# Patient Record
Sex: Male | Born: 1940 | Race: White | Hispanic: No | Marital: Married | Smoking: Never smoker
Health system: Southern US, Community
[De-identification: ages and names within clinical notes are randomized; demographics above are authoritative.]

## PROBLEM LIST (undated history)

## (undated) DIAGNOSIS — Z9660 Presence of unspecified orthopedic joint implant: Secondary | ICD-10-CM

## (undated) DIAGNOSIS — I447 Left bundle-branch block, unspecified: Secondary | ICD-10-CM

## (undated) DIAGNOSIS — E785 Hyperlipidemia, unspecified: Secondary | ICD-10-CM

## (undated) DIAGNOSIS — I10 Essential (primary) hypertension: Secondary | ICD-10-CM

## (undated) DIAGNOSIS — I6522 Occlusion and stenosis of left carotid artery: Secondary | ICD-10-CM

## (undated) DIAGNOSIS — C8510 Unspecified B-cell lymphoma, unspecified site: Secondary | ICD-10-CM

## (undated) DIAGNOSIS — Z9889 Other specified postprocedural states: Secondary | ICD-10-CM

## (undated) DIAGNOSIS — Z974 Presence of external hearing-aid: Secondary | ICD-10-CM

## (undated) DIAGNOSIS — N529 Male erectile dysfunction, unspecified: Secondary | ICD-10-CM

## (undated) DIAGNOSIS — Z859 Personal history of malignant neoplasm, unspecified: Secondary | ICD-10-CM

## (undated) DIAGNOSIS — Z85828 Personal history of other malignant neoplasm of skin: Secondary | ICD-10-CM

## (undated) DIAGNOSIS — C4491 Basal cell carcinoma of skin, unspecified: Secondary | ICD-10-CM

## (undated) DIAGNOSIS — N5089 Other specified disorders of the male genital organs: Secondary | ICD-10-CM

## (undated) DIAGNOSIS — J986 Disorders of diaphragm: Secondary | ICD-10-CM

## (undated) DIAGNOSIS — H353130 Nonexudative age-related macular degeneration, bilateral, stage unspecified: Secondary | ICD-10-CM

## (undated) DIAGNOSIS — Z85820 Personal history of malignant melanoma of skin: Secondary | ICD-10-CM

## (undated) DIAGNOSIS — H3530 Unspecified macular degeneration: Secondary | ICD-10-CM

## (undated) DIAGNOSIS — H43819 Vitreous degeneration, unspecified eye: Secondary | ICD-10-CM

## (undated) DIAGNOSIS — R3915 Urgency of urination: Secondary | ICD-10-CM

## (undated) DIAGNOSIS — D472 Monoclonal gammopathy: Secondary | ICD-10-CM

## (undated) DIAGNOSIS — C4492 Squamous cell carcinoma of skin, unspecified: Secondary | ICD-10-CM

## (undated) DIAGNOSIS — Z87898 Personal history of other specified conditions: Secondary | ICD-10-CM

## (undated) DIAGNOSIS — Z87442 Personal history of urinary calculi: Secondary | ICD-10-CM

## (undated) DIAGNOSIS — N200 Calculus of kidney: Secondary | ICD-10-CM

## (undated) DIAGNOSIS — C439 Malignant melanoma of skin, unspecified: Secondary | ICD-10-CM

## (undated) DIAGNOSIS — C8599 Non-Hodgkin lymphoma, unspecified, extranodal and solid organ sites: Secondary | ICD-10-CM

## (undated) HISTORY — PX: TONSILLECTOMY: SUR1361

## (undated) HISTORY — DX: Unspecified B-cell lymphoma, unspecified site: C85.10

## (undated) HISTORY — DX: Essential (primary) hypertension: I10

## (undated) HISTORY — DX: Disorders of diaphragm: J98.6

## (undated) HISTORY — DX: Squamous cell carcinoma of skin, unspecified: C44.92

## (undated) HISTORY — DX: Occlusion and stenosis of left carotid artery: I65.22

## (undated) HISTORY — DX: Calculus of kidney: N20.0

## (undated) HISTORY — DX: Unspecified macular degeneration: H35.30

## (undated) HISTORY — DX: Basal cell carcinoma of skin, unspecified: C44.91

## (undated) HISTORY — DX: Presence of unspecified orthopedic joint implant: Z96.60

## (undated) HISTORY — DX: Hyperlipidemia, unspecified: E78.5

## (undated) HISTORY — DX: Vitreous degeneration, unspecified eye: H43.819

## (undated) HISTORY — DX: Non-Hodgkin lymphoma, unspecified, extranodal and solid organ sites: C85.99

## (undated) HISTORY — DX: Male erectile dysfunction, unspecified: N52.9

## (undated) HISTORY — DX: Malignant melanoma of skin, unspecified: C43.9

## (undated) HISTORY — PX: EYE SURGERY: SHX253

---

## 1998-06-21 ENCOUNTER — Ambulatory Visit (HOSPITAL_COMMUNITY): Admission: RE | Admit: 1998-06-21 | Discharge: 1998-06-21 | Payer: Self-pay | Admitting: *Deleted

## 1998-10-17 DIAGNOSIS — C8599 Non-Hodgkin lymphoma, unspecified, extranodal and solid organ sites: Secondary | ICD-10-CM

## 1998-10-17 HISTORY — DX: Non-Hodgkin lymphoma, unspecified, extranodal and solid organ sites: C85.99

## 1998-10-17 HISTORY — PX: ORCHIECTOMY: SHX2116

## 1998-11-04 ENCOUNTER — Ambulatory Visit (HOSPITAL_COMMUNITY): Admission: RE | Admit: 1998-11-04 | Discharge: 1998-11-04 | Payer: Self-pay | Admitting: Family Medicine

## 1999-07-15 ENCOUNTER — Ambulatory Visit (HOSPITAL_COMMUNITY): Admission: RE | Admit: 1999-07-15 | Discharge: 1999-07-15 | Payer: Self-pay | Admitting: General Surgery

## 1999-07-15 ENCOUNTER — Encounter: Payer: Self-pay | Admitting: General Surgery

## 1999-07-21 ENCOUNTER — Encounter: Payer: Self-pay | Admitting: General Surgery

## 1999-07-22 ENCOUNTER — Ambulatory Visit (HOSPITAL_COMMUNITY): Admission: RE | Admit: 1999-07-22 | Discharge: 1999-07-22 | Payer: Self-pay | Admitting: General Surgery

## 1999-08-05 ENCOUNTER — Encounter: Admission: RE | Admit: 1999-08-05 | Discharge: 1999-08-05 | Payer: Self-pay | Admitting: Oncology

## 1999-08-05 ENCOUNTER — Encounter: Payer: Self-pay | Admitting: Oncology

## 1999-08-19 ENCOUNTER — Encounter: Admission: RE | Admit: 1999-08-19 | Discharge: 1999-11-17 | Payer: Self-pay | Admitting: Oncology

## 1999-09-21 ENCOUNTER — Ambulatory Visit (HOSPITAL_COMMUNITY): Admission: RE | Admit: 1999-09-21 | Discharge: 1999-09-21 | Payer: Self-pay | Admitting: Oncology

## 1999-09-21 ENCOUNTER — Encounter: Payer: Self-pay | Admitting: Oncology

## 1999-10-18 HISTORY — PX: MELANOMA EXCISION: SHX5266

## 2000-03-18 ENCOUNTER — Emergency Department (HOSPITAL_COMMUNITY): Admission: EM | Admit: 2000-03-18 | Discharge: 2000-03-18 | Payer: Self-pay | Admitting: Internal Medicine

## 2000-09-29 ENCOUNTER — Ambulatory Visit (HOSPITAL_COMMUNITY): Admission: RE | Admit: 2000-09-29 | Discharge: 2000-09-29 | Payer: Self-pay | Admitting: Oncology

## 2000-09-29 ENCOUNTER — Encounter: Payer: Self-pay | Admitting: Oncology

## 2001-01-10 ENCOUNTER — Encounter: Admission: RE | Admit: 2001-01-10 | Discharge: 2001-01-10 | Payer: Self-pay | Admitting: Oncology

## 2001-01-10 ENCOUNTER — Encounter: Payer: Self-pay | Admitting: Oncology

## 2001-07-12 ENCOUNTER — Ambulatory Visit (HOSPITAL_COMMUNITY): Admission: RE | Admit: 2001-07-12 | Discharge: 2001-07-12 | Payer: Self-pay | Admitting: Oncology

## 2001-07-12 ENCOUNTER — Encounter: Payer: Self-pay | Admitting: Oncology

## 2001-07-31 ENCOUNTER — Ambulatory Visit (HOSPITAL_COMMUNITY): Admission: RE | Admit: 2001-07-31 | Discharge: 2001-07-31 | Payer: Self-pay | Admitting: Oncology

## 2001-07-31 ENCOUNTER — Encounter: Payer: Self-pay | Admitting: Oncology

## 2001-09-10 ENCOUNTER — Ambulatory Visit (HOSPITAL_COMMUNITY): Admission: RE | Admit: 2001-09-10 | Discharge: 2001-09-10 | Payer: Self-pay | Admitting: Gastroenterology

## 2002-08-19 ENCOUNTER — Ambulatory Visit (HOSPITAL_COMMUNITY): Admission: RE | Admit: 2002-08-19 | Discharge: 2002-08-19 | Payer: Self-pay | Admitting: Oncology

## 2002-08-19 ENCOUNTER — Encounter: Payer: Self-pay | Admitting: Oncology

## 2002-09-06 ENCOUNTER — Encounter: Payer: Self-pay | Admitting: Oncology

## 2002-09-06 ENCOUNTER — Ambulatory Visit (HOSPITAL_COMMUNITY): Admission: RE | Admit: 2002-09-06 | Discharge: 2002-09-06 | Payer: Self-pay | Admitting: Oncology

## 2004-11-15 ENCOUNTER — Ambulatory Visit (HOSPITAL_COMMUNITY): Admission: RE | Admit: 2004-11-15 | Discharge: 2004-11-15 | Payer: Self-pay | Admitting: Oncology

## 2004-11-24 ENCOUNTER — Ambulatory Visit: Payer: Self-pay | Admitting: Oncology

## 2009-06-17 HISTORY — PX: TOTAL KNEE ARTHROPLASTY: SHX125

## 2015-12-08 LAB — CBC AND DIFFERENTIAL
HCT: 43 (ref 41–53)
Hemoglobin: 14.4 (ref 13.5–17.5)
Platelets: 210 (ref 150–399)
WBC: 5.6

## 2015-12-11 LAB — BASIC METABOLIC PANEL
BUN: 18 (ref 4–21)
Creatinine: 0.7 (ref 0.6–1.3)
Glucose: 96
Potassium: 4.3 (ref 3.4–5.3)
Potassium: 4.3 (ref 3.4–5.3)
Sodium: 139 (ref 137–147)
Sodium: 139 (ref 137–147)

## 2015-12-11 LAB — LIPID PANEL
Cholesterol: 135 (ref 0–200)
HDL: 61 (ref 35–70)
LDL Cholesterol: 63
Triglycerides: 56 (ref 40–160)

## 2015-12-11 LAB — VITAMIN D 25 HYDROXY (VIT D DEFICIENCY, FRACTURES)
Vit D, 25-Hydroxy: 42.1
Vit D, 25-Hydroxy: 42.1

## 2015-12-11 LAB — HEPATIC FUNCTION PANEL
ALT: 39 (ref 10–40)
AST: 21 (ref 14–40)
Alkaline Phosphatase: 59 (ref 25–125)
Bilirubin, Total: 0.6

## 2015-12-11 LAB — HEMOGLOBIN A1C: Hemoglobin A1C: 5.7

## 2015-12-11 LAB — TSH: TSH: 2.18 (ref 0.41–5.90)

## 2016-10-17 HISTORY — PX: CATARACT EXTRACTION W/ INTRAOCULAR LENS IMPLANT: SHX1309

## 2017-01-15 HISTORY — PX: CATARACT EXTRACTION: SUR2

## 2017-05-18 DIAGNOSIS — L119 Acantholytic disorder, unspecified: Secondary | ICD-10-CM | POA: Diagnosis not present

## 2017-05-18 DIAGNOSIS — D045 Carcinoma in situ of skin of trunk: Secondary | ICD-10-CM | POA: Diagnosis not present

## 2017-05-18 DIAGNOSIS — D1801 Hemangioma of skin and subcutaneous tissue: Secondary | ICD-10-CM | POA: Diagnosis not present

## 2017-05-18 DIAGNOSIS — L821 Other seborrheic keratosis: Secondary | ICD-10-CM | POA: Diagnosis not present

## 2017-05-18 DIAGNOSIS — L820 Inflamed seborrheic keratosis: Secondary | ICD-10-CM | POA: Diagnosis not present

## 2017-05-18 DIAGNOSIS — Z85820 Personal history of malignant melanoma of skin: Secondary | ICD-10-CM | POA: Diagnosis not present

## 2017-05-18 DIAGNOSIS — C44509 Unspecified malignant neoplasm of skin of other part of trunk: Secondary | ICD-10-CM | POA: Diagnosis not present

## 2017-05-18 DIAGNOSIS — L570 Actinic keratosis: Secondary | ICD-10-CM | POA: Diagnosis not present

## 2017-05-18 DIAGNOSIS — D485 Neoplasm of uncertain behavior of skin: Secondary | ICD-10-CM | POA: Diagnosis not present

## 2017-05-18 DIAGNOSIS — C44699 Other specified malignant neoplasm of skin of left upper limb, including shoulder: Secondary | ICD-10-CM | POA: Diagnosis not present

## 2017-06-21 ENCOUNTER — Non-Acute Institutional Stay: Payer: PPO | Admitting: Internal Medicine

## 2017-06-21 ENCOUNTER — Encounter: Payer: Self-pay | Admitting: Internal Medicine

## 2017-06-21 VITALS — BP 136/80 | HR 82 | Temp 97.8°F | Ht 72.0 in | Wt 195.0 lb

## 2017-06-21 DIAGNOSIS — Z87442 Personal history of urinary calculi: Secondary | ICD-10-CM

## 2017-06-21 DIAGNOSIS — H9113 Presbycusis, bilateral: Secondary | ICD-10-CM | POA: Insufficient documentation

## 2017-06-21 DIAGNOSIS — K5901 Slow transit constipation: Secondary | ICD-10-CM

## 2017-06-21 DIAGNOSIS — I10 Essential (primary) hypertension: Secondary | ICD-10-CM | POA: Diagnosis not present

## 2017-06-21 DIAGNOSIS — Z85820 Personal history of malignant melanoma of skin: Secondary | ICD-10-CM | POA: Insufficient documentation

## 2017-06-21 DIAGNOSIS — E7800 Pure hypercholesterolemia, unspecified: Secondary | ICD-10-CM | POA: Insufficient documentation

## 2017-06-21 DIAGNOSIS — H6121 Impacted cerumen, right ear: Secondary | ICD-10-CM | POA: Diagnosis not present

## 2017-06-21 DIAGNOSIS — G4720 Circadian rhythm sleep disorder, unspecified type: Secondary | ICD-10-CM | POA: Diagnosis not present

## 2017-06-21 DIAGNOSIS — Z8572 Personal history of non-Hodgkin lymphomas: Secondary | ICD-10-CM | POA: Insufficient documentation

## 2017-06-21 DIAGNOSIS — M5030 Other cervical disc degeneration, unspecified cervical region: Secondary | ICD-10-CM | POA: Diagnosis not present

## 2017-06-21 DIAGNOSIS — H3530 Unspecified macular degeneration: Secondary | ICD-10-CM | POA: Diagnosis not present

## 2017-06-21 DIAGNOSIS — I6522 Occlusion and stenosis of left carotid artery: Secondary | ICD-10-CM | POA: Diagnosis not present

## 2017-06-21 DIAGNOSIS — D126 Benign neoplasm of colon, unspecified: Secondary | ICD-10-CM | POA: Diagnosis not present

## 2017-06-21 DIAGNOSIS — Z23 Encounter for immunization: Secondary | ICD-10-CM

## 2017-06-21 DIAGNOSIS — E559 Vitamin D deficiency, unspecified: Secondary | ICD-10-CM | POA: Insufficient documentation

## 2017-06-21 MED ORDER — ZOSTER VAC RECOMB ADJUVANTED 50 MCG/0.5ML IM SUSR: 0.5000 mL | Freq: Once | INTRAMUSCULAR | 1 refills | Status: AC

## 2017-06-21 NOTE — Patient Instructions (Addendum)
Please come in for fasting labs in 1-2 weeks in [place] [facility] at [place].  [facility] gave you an appointment card).  We'll set you up for your wellness visit May of 2019.    Please call me if you have any problems between now and then.    Try to increase your water intake, fruits and vegetables and exercise routine for balance and cardio/aerobic.    Be sure to take your meloxicam with food.    I sent the shingrix shot orders to [facility] for you.

## 2017-06-21 NOTE — Progress Notes (Signed)
Provider:  Rexene Edison. Mariea Clonts, D.O., C.M.D. Location:  Occupational psychologist of Service:  Clinic (12)  Previous PCP: [name], DO Patient Care Team: [name], DO as PCP - General (Geriatric Medicine)  Extended Emergency Contact Information Primary Emergency Contact: Brindley,[name] Address: [place] [address] [name] Phone: [phone] Relation: Spouse  Code Status: DNR Goals of Care: Advanced Directive information Advanced Directives 06/21/2017  Does Patient Have a Medical Advance Directive? Yes  Type of Paramedic of Page;Living will;Out of facility DNR (pink MOST or yellow form)  Copy of Johnson in Chart? No - copy requested  Pre-existing out of facility DNR order (yellow form or pink MOST form) Yellow form placed in chart (order not valid for inpatient use)    Chief Complaint  Patient presents with  . Establish Care    new patient to [place] [facility], referred by Dr. Debara Pickett (cardiology)    HPI: Patient is a 76 y.o. male seen today to establish with [facility].  Records have been requested from Dr. Mathis Dad fax [phone] on 05/09/17.  None received as of 06/21/17. New patient packet reviewed.  Pt actually brought copies of his annual wellness exams from the past three years and his labs.  He is from the [place], [place] area.  Had a place in [place], [place] in summers.  They sold that two months ago.  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  Lived in [place] for 10 years in between time here in [place].    Melanoma:  Had melanoma of left calf in 2000. Had chemotherapy and XRT.  Dermatology--now follows with Dr Wilhemina Bonito.  He did a lot of biopsies. Returns for an operation next Monday.  Getting his twice a year routine skin checks.    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  Had radiation,  surgery, and shots in the spine x 6 and interferon shots for a year (he does not recall if it was for melanoma or lymphoma), also had chemo.   Stopped going to a lymphoma specialist.  LDH was being done.    Both of the above were found and managed very quickly.  He's had no recurrences.  HTN:  BP at goal with cozaar 100mg  daily.    Hyperlipidemia: 12/08/15 LDL was 63.  On zocor 40mg  daily.    Has sinus drainage, but no allergies to medications.  Left carotid stenosis:  Found on one of the annual screening tests.    Macular degeneration:  He's on watch for this.  He was waiting to hear back from Dr. Tempie Hoist.  Mentioned new physician joined there also who may be able to see him and is a retina specialist.    Kidney stone:  In [place], he didn't feel well in the evening.  Said he really didn't feel well and his wife went down to the desk.  He saw a doctor and was given morphine and passed the stone unknowingly and felt much better the next day.    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  Tubular adenomas on cscope 09/18/12.  F/u needed this December per previous PCP notes--he is not keen on this and  is over 26 yo.      Has not had shingrix b/c of moving and wanted to get both at the same place.  Had zostavax, prevnar, TD, and pneumovax.   Uses valacyclovir prn fever blisters.    Meloxicam is for his stiff neck.  Had therapy, accupuncture, shots in the neck.  It's been like magic.  He's afraid to come off of it.    Has amoxicillin prn dental work due to prior knee replacement.  Discussed that that is no longer indicated.  He does report having a friend who had a septic joint replacement so he's worried about this.  Discussed yogurt if he opts to take the amoxicillin.    Says all of a sudden, he's getting up at 3am.  He's not attributing it to what he ate or drank.  Starts to think about what he's doing the next day.  He  does not take anything for it.  Was taking a benadryl type thing but read it's not so good so stopped it.  His wife has given him sleepytime tea.  He has lost his workout routine.  It will be a couple more weeks until that's straight due to travels for hunting.  [place] but [place], he's less active.    Constipation:  New onset.  Used to be so regular, it was unbelievable.  Admits diet is different more b/c of traveling.  Not getting so many vegetables and does not drink his water.  Trying to keep a glass at the desk at home.    Past Medical History:  Diagnosis Date  . Cataracts, bilateral   . Hyperlipidemia   . Hypertension   . Lymphoma of testis ([place])   . Melanoma [facility])    Past Surgical History:  Procedure Laterality Date  . CATARACT EXTRACTION W/ INTRAOCULAR LENS IMPLANT Bilateral 2018  . MELANOMA EXCISION  10/1999  . TOTAL KNEE ARTHROPLASTY  06/2009    Social History   Social History  . Marital status: Married    Spouse name: N/A  . Number of children: N/A  . Years of education: N/A   Social History Main Topics  . Smoking status: Never Smoker  . Smokeless tobacco: Never Used  . Alcohol use Yes     Comment: 5 to 7 drinks per week  . Drug use: No  . Sexual activity: Not Currently   Other Topics Concern  . None   Social History Narrative   Social History      Diet?good      Do you drink/eat things with caffeine? seldom      Marital status?          yes                          What year were you married? 1962      Do you live in a house, apartment, assisted living, condo, trailer, etc.? house      Is it one or more stories?1      How many persons live in your home? 2      Do you have any pets in your home? (please list) no      Highest level of education completed? college      Current or past profession: president and [employer]      Do you exercise?   yes  Type &  how often? 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 yes      Do you have a DNR form?       yes                            If not, do you want to discuss one?      Do you have signed POA/HPOA for forms?  yes      Functional Status      Do you have difficulty bathing or dressing yourself? no      Do you have difficulty preparing food or eating? no      Do you have difficulty managing your medications? no      Do you have difficulty managing your finances? no      Do you have difficulty affording your medications? no       reports that he has never smoked. He has never used smokeless tobacco. He reports that he drinks alcohol. He reports that he does not use drugs.  Functional Status Survey:  independent  History reviewed. No pertinent family history.  Health Maintenance  Topic Date Due  . TETANUS/TDAP  02/10/1958  . PNA vac Low Risk Adult (1 of 2 - PCV13) 02/11/2004  . INFLUENZA VACCINE  05/17/2017    No Known Allergies  Allergies as of 06/21/2017   No Known Allergies     Medication List       Accurate as of 06/21/17  3:59 PM. Always use your most recent med list.          losartan 100 MG tablet Commonly known as:  COZAAR Take 100 mg by mouth daily.   meloxicam 7.5 MG tablet Commonly known as:  MOBIC Take 7.5 mg by mouth as needed for pain.   PRESERVISION AREDS 2 PO Take 2 capsules by mouth daily.   simvastatin 40 MG tablet Commonly known as:  ZOCOR Take 40 mg by mouth daily.   Vitamin D3 2000 units Tabs Take 1 tablet by mouth daily.   Zoster Vac Recomb Adjuvanted injection Commonly known as:  SHINGRIX Inject 0.5 mLs into the muscle once.            Discharge Care Instructions        Start     Ordered   06/21/17 0000  Zoster Vac Recomb Adjuvanted [facility]) injection   Once     06/21/17 1432   06/21/17 0000  EAR CERUMEN REMOVAL     06/21/17 1505      Review of Systems  Constitutional: Negative for chills, fever and  malaise/fatigue.  HENT: Positive for hearing loss and tinnitus. Negative for congestion.        Hearing aids  Eyes: Negative for blurred vision.       Macular degeneration  Respiratory: Negative for cough and shortness of breath.   Cardiovascular: Negative for chest pain, palpitations and leg swelling.  Gastrointestinal: Positive for constipation. Negative for abdominal pain, blood in stool, diarrhea, heartburn, melena, nausea and vomiting.  Genitourinary: Negative for dysuria.  Musculoskeletal: Positive for joint pain and neck pain. Negative for falls.       Left knee injury   Skin: Negative for itching and rash.       H/o skin cancers--SCC, melanoma and basal cell ca  Neurological: Negative for loss of consciousness, weakness and headaches.       Some  balance problems  Endo/Heme/Allergies: Does not bruise/bleed easily.  Psychiatric/Behavioral: Negative for depression and memory loss. The patient does not have insomnia.     Vitals:   06/21/17 1318  BP: 136/80  Pulse: 82  Temp: 97.8 F (36.6 C)  TempSrc: Oral  SpO2: 97%  Weight: 195 lb (88.5 kg)  Height: 6' (1.829 m)   Body mass index is 26.45 kg/m. Physical Exam  Constitutional: He is oriented to person, place, and time. He appears well-developed and well-nourished. No distress.  HENT:  Head: Normocephalic and atraumatic.  Right Ear: External ear normal.  Left Ear: External ear normal.  Nose: Nose normal.  Mouth/Throat: Oropharynx is clear and moist. No oropharyngeal exudate.  Right ear cerumen impaction; left ear with very little cerumen, pink tm  Eyes: Pupils are equal, round, and reactive to light. Conjunctivae and EOM are normal. No scleral icterus.  Neck: Neck supple. No JVD present. No tracheal deviation present. No thyromegaly present.  Cardiovascular: Normal rate, regular rhythm, normal heart sounds and intact distal pulses.   No murmur heard. Pulmonary/Chest: Effort normal and breath sounds normal. No  respiratory distress.  Abdominal: Soft. Bowel sounds are normal. He exhibits no distension. There is no tenderness.  Musculoskeletal: Normal range of motion.  Limited movement of left knee with prior injury, no tenderness of ROM difficulty with neck since on mobic  Lymphadenopathy:    He has no cervical adenopathy.  Neurological: He is alert and oriented to person, place, and time. No cranial nerve deficit.  Skin: Skin is warm and dry. Capillary refill takes less than 2 seconds.  Psychiatric: He has a normal mood and affect. His behavior is normal. Judgment and thought content normal.    Labs reviewed: Basic Metabolic Panel: No results for input(s): NA, K, CL, CO2, GLUCOSE, BUN, CREATININE, CALCIUM, MG, PHOS in the last 8760 hours. Liver Function Tests: No results for input(s): AST, ALT, ALKPHOS, BILITOT, PROT, ALBUMIN in the last 8760 hours. No results for input(s): LIPASE, AMYLASE in the last 8760 hours. No results for input(s): AMMONIA in the last 8760 hours. CBC: No results for input(s): WBC, NEUTROABS, HGB, HCT, MCV, PLT in the last 8760 hours. Cardiac Enzymes: No results for input(s): CKTOTAL, CKMB, CKMBINDEX, TROPONINI in the last 8760 hours. BNP: Invalid input(s): POCBNP No results found for: HGBA1C No results found for: TSH No results found for: VITAMINB12 No results found for: FOLATE No results found for: IRON, TIBC, FERRITIN Labs reviewed and 2017 results to be abstracted  Imaging and Procedures noted on new patient packet: Cscope reviewed 2013 with tubular adenoma  Assessment/Plan 1. Sleep-wake disorder -now waking in the middle of the night -agrees to cont sleepy tea and trying to exercise more to get back on track  2. Slow transit constipation -new problem but diet not as good and not hydrating or exercising--encouraged these to help this issue  3. Macular degeneration of both eyes, unspecified type -cont preservision, awaits appt with Dr. Zigmund Daniel, et  al  4. Benign essential HTN -bp at goal with losartan, cont to monitor  5. Pure hypercholesterolemia -cont zocor therapy, last LDL last year was at goal  6. History of B-cell lymphoma -see hpi -check LDH annually and cbc  7. Personal history of malignant melanoma -no longer follows with oncology, but seeing Dr. Wilhemina Bonito form derm for twice a year skin checks  8. Left carotid artery stenosis -noted on a screening study, not a legitimate test by a physician  9. DDD (degenerative disc  disease), cervical -better with mobic, counseled to take with food to avoid GI upset  10. History of kidney stones -one time episode  11. Tubular adenoma of colon -in 2013, f/u cscope in 12/18 recommended, pt currently declines  12. Hearing loss of right ear due to cerumen impaction -s/p flushing with warm water and hydrogen peroxide  13. 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 Vitamin D deficiency -cont vitamin D supplement and f/u level  15. Need for shingles vaccine - Zoster Vac Recomb Adjuvanted [facility]) injection; Inject 0.5 mLs into the muscle once.  Dispense: 0.5 mL; Refill: 1  Labs/tests ordered:  Cbc with diff,bmp, liver panel, flp, LDH, vitamin D in 2 wks   Dava Rensch L. Elma Shands, D.O. [facility] [address] [place],  [address] Cell Phone (Mon-Fri 8am-5pm):  [phone] On Call:  [phone] & follow prompts after 5pm & weekends Office Phone:  [phone] Office Fax:  [phone]

## 2017-06-23 ENCOUNTER — Encounter: Payer: Self-pay | Admitting: Internal Medicine

## 2017-07-03 ENCOUNTER — Telehealth: Payer: Self-pay | Admitting: Internal Medicine

## 2017-07-03 DIAGNOSIS — C44609 Unspecified malignant neoplasm of skin of left upper limb, including shoulder: Secondary | ICD-10-CM | POA: Diagnosis not present

## 2017-07-03 DIAGNOSIS — C44629 Squamous cell carcinoma of skin of left upper limb, including shoulder: Secondary | ICD-10-CM | POA: Diagnosis not present

## 2017-07-03 NOTE — Telephone Encounter (Signed)
Returned call to [employer] with [facility].Advised patient is not a patient of Dr.Hilty.After reviewing chart he has never seen cardiology.

## 2017-07-03 NOTE — Telephone Encounter (Signed)
New Message  Antonio [name] was calling form the eye Doctors office he was referred to. They were calling to speak with RN to see what exactly pt was being referred for .please call back to discuss

## 2017-07-04 ENCOUNTER — Encounter: Payer: Self-pay | Admitting: Internal Medicine

## 2017-07-04 LAB — CBC AND DIFFERENTIAL
HCT: 47 (ref 41–53)
Hemoglobin: 15.2 (ref 13.5–17.5)
Platelets: 232 (ref 150–399)
WBC: 7.1

## 2017-07-04 LAB — BASIC METABOLIC PANEL
BUN: 19 (ref 4–21)
Creatinine: 0.7 (ref 0.6–1.3)
Glucose: 96
Potassium: 4.7 (ref 3.4–5.3)
Sodium: 141 (ref 137–147)

## 2017-07-04 LAB — LIPID PANEL
Cholesterol: 171 (ref 0–200)
HDL: 77 — AB (ref 35–70)
LDL Cholesterol: 80
Triglycerides: 71 (ref 40–160)

## 2017-07-04 LAB — HEPATIC FUNCTION PANEL
ALT: 37 (ref 10–40)
AST: 30 (ref 14–40)
Alkaline Phosphatase: 66 (ref 25–125)
Bilirubin, Total: 0.9

## 2017-07-04 LAB — VITAMIN D 25 HYDROXY (VIT D DEFICIENCY, FRACTURES): Vit D, 25-Hydroxy: 40.39

## 2017-07-07 ENCOUNTER — Encounter: Payer: Self-pay | Admitting: Internal Medicine

## 2017-07-10 DIAGNOSIS — H353132 Nonexudative age-related macular degeneration, bilateral, intermediate dry stage: Secondary | ICD-10-CM | POA: Diagnosis not present

## 2017-07-10 NOTE — Progress Notes (Signed)
[facility] Note  07/11/2017     CHIEF COMPLAINT Patient presents for Retina Evaluation   HISTORY OF PRESENT ILLNESS: Antonio [name] is a 76 y.o. male who presents to the clinic today for:   HPI    Retina Evaluation  In both eyes.  This started 1 year ago.  Associated Symptoms Floaters.  Negative for Flashes, Blind Spot, Photophobia, Scalp Tenderness, Fever, Pain, Glare, Weight Loss, Jaw Claudication, Distortion, Redness, Trauma, Shoulder/Hip pain and Fatigue.  Context:  distance vision, mid-range vision and near vision.  Treatments tried include no treatments.        Comments  Pt presents for retinal evaluation; Pt reports that he recently moved to the area and wants to establish care; Pt states that he has floaters off and on; Pt reports only having flashes when he rubs OU; Pt states that he has more floaters since having cataract sx in April of this year (2018); Pt denies wavy VA, denies ocular pain; Pt denies gtts usage; Pt reports taking AREDS 2; Pt states that he has a "foresee" device that tracks his VA and alerts MD if there is a drop in [place];      Last edited by Alyse Low on 07/11/2017  8:49 AM. (History)      Referring physician: [name], DO [address], [place] [address]  HISTORICAL INFORMATION:   Selected notes from the [place] for ARMD OU; Moved from [place], [place]  Ocular Hx - nonexudative ARMD OU; PVD OU; pseudophakia OU (Toric OU - 12/2016 by Dr. Nance Pear, [place], [facility]);  PMH - HTN; [facility] - ARMD - Mother   CURRENT MEDICATIONS: No current outpatient prescriptions on file. (Ophthalmic Drugs)   No current facility-administered medications for this visit.  (Ophthalmic Drugs)   Current Outpatient Prescriptions (Other)  Medication Sig  . Cholecalciferol (VITAMIN D3) 2000 units TABS Take 1 tablet by mouth daily.  Marland Kitchen losartan (COZAAR) 100 MG tablet Take 100 mg by mouth daily.  .  meloxicam (MOBIC) 7.5 MG tablet Take 7.5 mg by mouth as needed for pain.  . Multiple Vitamins-Minerals (PRESERVISION AREDS 2 PO) Take 2 capsules by mouth daily.  . simvastatin (ZOCOR) 40 MG tablet Take 40 mg by mouth daily.   No current facility-administered medications for this visit.  (Other)      REVIEW OF SYSTEMS: ROS    Positive for: Constitutional   Negative for: Gastrointestinal, Neurological, Skin, Genitourinary, Musculoskeletal, HENT, Endocrine, Cardiovascular, Eyes, Respiratory, Psychiatric, Allergic/Imm, Heme/Lymph   Last edited by Alyse Low on 07/11/2017  8:49 AM. (History)       ALLERGIES No Known Allergies  PAST MEDICAL HISTORY Past Medical History:  Diagnosis Date  . B-cell lymphoma ([place])    testicular diffuse large b-cell lymphoma  . BCC (basal cell carcinoma of skin)   . Cataracts, bilateral   . Elevated diaphragm    right  . Erectile dysfunction   . Hyperlipidemia   . Hypertension   . Kidney stone   . Lymphoma of testis ([place])   . Melanoma ([place])   . Melanoma ([place])   . Posterior vitreous detachment   . SCC (squamous cell carcinoma)   . Status post joint replacement    right TKA   Past Surgical History:  Procedure Laterality Date  . CATARACT EXTRACTION Bilateral 01/2017   Dr. Merril Abbe [facility], [place])   . CATARACT EXTRACTION W/ INTRAOCULAR LENS IMPLANT Bilateral 2018  . MELANOMA EXCISION  10/1999  . TOTAL KNEE ARTHROPLASTY  06/2009    FAMILY HISTORY Family History  Problem Relation Age of Onset  . Cataracts Mother   . Cataracts Father   . Amblyopia Neg Hx   . Blindness Neg Hx   . Glaucoma Neg Hx   . Macular degeneration Neg Hx   . Retinal detachment Neg Hx   . Strabismus Neg Hx   . Retinitis pigmentosa Neg Hx     SOCIAL HISTORY Social History  Substance Use Topics  . Smoking status: Never Smoker  . Smokeless tobacco: Never Used  . Alcohol use Yes     Comment: 5 to 7 drinks per week         OPHTHALMIC EXAM:  Base  Eye Exam    Visual Acuity (Snellen - Linear)      Right Left   Dist cc 20/20 20/20 -1   Dist ph cc NI 20/20   Correction:  Glasses       Tonometry (Applanation, 9:01 AM)      Right Left   Pressure 19 18       Pupils      Dark Light Shape React APD   Right 3 2 Round Brisk None   Left 3 2 Round Brisk None       Visual Fields (Counting fingers)      Left Right    Full Full       Extraocular Movement      Right Left    Full, Ortho Full, Ortho       Neuro/Psych    Oriented x3:  Yes   Mood/Affect:  Normal       Dilation    Both eyes:  1.0% Mydriacyl, 2.5% Phenylephrine @ 9:01 AM   Phen 10% instilled in OU at 9:25AM        Slit Lamp and Fundus Exam    External Exam      Right Left   External Brow ptosis - mild Brow ptosis - mild       Slit Lamp Exam      Right Left   Lids/Lashes Dermatochalasis - upper lid Dermatochalasis - upper lid   Conjunctiva/Sclera White and quiet White and quiet   Cornea Clear; trace endo pigment; well healed femoto laser wound Clear; trace endo pigment; well healed femoto laser wound   Anterior Chamber Deep and quiet Deep and quiet   Iris Round with moderate dilated to 58mm Round with moderate dilated to 69mm   Lens Posterior chamber intraocular lens in good postion, 1-2+ Posterior capsular opacification Posterior chamber intraocular lens in good postion; 1+ Posterior capsular opacification   Vitreous Vitreous syneresis, Posterior vitreous detachment Vitreous syneresis, Posterior vitreous detachment       Fundus Exam      Right Left   Disc compact with Peripapillary atrophy 360 compact with Peripapillary atrophy 360   C/D Ratio 0.5 0.5   Macula Drusen, Retinal pigment epithelial mottling and clumping  Flat; Drusen, Retinal pigment epithelial mottling and clumping    Vessels AV crossing changes - mild AV crossing changes - mild   Periphery Attached; Reticular degeneration;  Attached; Reticular degeneration;         Refraction     Wearing Rx      Sphere Cylinder Axis Add   Right -0.75 +0.50 106 +2.50   Left -0.50 +0.75 121 +2.50   Age:  73m   Type:  PAL  IMAGING AND PROCEDURES  Imaging and Procedures for 07/11/17  OCT, Retina - OU - Both Eyes     Right Eye Quality was good. Central Foveal Thickness: 322. Findings include normal foveal contour, no IRF, no SRF (Focal areas of outter retinal atrophy / ellipsoid dropout; scattered drusen).   Left Eye Quality was good. Central Foveal Thickness: 330. Findings include normal foveal contour, no IRF, no SRF (Scattered drusen; ).   Notes Images stored on drive  Diagnosis / Impression:  Mild nonexudative ARMD OU  Clinical management:  See below  Abbreviations: NFP - Normal foveal profile. CME - cystoid macular edema. PED - pigment epithelial detachment. IRF - intraretinal fluid. SRF - subretinal fluid. EZ - ellipsoid zone. ERM - epiretinal membrane. ORA - outer retinal atrophy. ORT - outer retinal tubulation. SRHM - subretinal hyper-reflective material                ASSESSMENT/PLAN:    ICD-10-CM   1. Early dry stage nonexudative age-related macular degeneration of both eyes H35.3131 OCT, Retina - OU - Both Eyes  2. Pseudophakia of both eyes Z96.1   3. 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 Bilateral posterior capsular opacification H26.493     1. Age related macular degeneration, non-exudative, both eyes  - The incidence, anatomy, and pathology of dry AMD, risk of progression, and the AREDS and AREDS 2 study including smoking risks discussed with patient.  - Recommend amsler grid monitoring  - has a Foresee AMD monitoring device at home  - f/u 6 mos  2. Pseudophakia OU  - s/p CE/PCIOL OU (Toric + femto) 12/2016 by Dr. Nance Pear in [facility]  - had post op IOP issue OS  - beautiful surgery, doing well  - monitor  3. H/o ocular hypertension OU  - IOP good today off combigan  - monitor  4. PCO OU  - Discussed YAG Cap treatment  for PCO;  - not yet visually significant  - monitor for now  Ophthalmic Meds Ordered this visit:  No orders of the defined types were placed in this encounter.      Return in about 6 months (around 01/08/2018) for Dilated Exam, OCT.  There are no Patient Instructions on file for this visit.   Explained the diagnoses, plan, and follow up with the patient and they expressed understanding.  Patient expressed understanding of the importance of proper follow up care.   Gardiner Sleeper, M.D., Ph.D. Vitreoretinal Surgeon [facility] 07/11/17     Abbreviations: M myopia (nearsighted); A astigmatism; H hyperopia (farsighted); P presbyopia; Mrx spectacle prescription;  CTL contact lenses; OD right eye; OS left eye; OU both eyes  XT exotropia; ET esotropia; PEK punctate epithelial keratitis; PEE punctate epithelial erosions; DES dry eye syndrome; MGD meibomian gland dysfunction; ATs artificial tears; PFAT's preservative free artificial tears; [place] nuclear sclerotic cataract; PSC posterior subcapsular cataract; ERM epi-retinal membrane; PVD posterior vitreous detachment; RD retinal detachment; DM diabetes mellitus; DR diabetic retinopathy; NPDR non-proliferative diabetic retinopathy; PDR proliferative diabetic retinopathy; CSME clinically significant macular edema; DME diabetic macular edema; dbh dot blot hemorrhages; CWS cotton wool spot; POAG primary open angle glaucoma; C/D cup-to-disc ratio; HVF humphrey visual field; GVF goldmann visual field; OCT optical coherence tomography; IOP intraocular pressure; BRVO Branch retinal vein occlusion; CRVO central retinal vein occlusion; CRAO central retinal artery occlusion; BRAO branch retinal artery occlusion; RT retinal tear; SB scleral buckle; PPV pars plana vitrectomy; VH Vitreous hemorrhage; PRP panretinal laser photocoagulation; IVK  intravitreal kenalog; VMT vitreomacular traction; MH Macular hole;  NVD neovascularization of the  disc; NVE neovascularization elsewhere; AREDS age related eye disease study; ARMD age related macular degeneration; POAG primary open angle glaucoma; EBMD epithelial/anterior basement membrane dystrophy; ACIOL anterior chamber intraocular lens; IOL intraocular lens; PCIOL posterior chamber intraocular lens; Phaco/IOL phacoemulsification with intraocular lens placement; [place] photorefractive keratectomy; LASIK laser assisted in situ keratomileusis; HTN hypertension; DM diabetes mellitus; COPD chronic obstructive pulmonary disease

## 2017-07-11 ENCOUNTER — Ambulatory Visit (INDEPENDENT_AMBULATORY_CARE_PROVIDER_SITE_OTHER): Payer: PPO | Admitting: Ophthalmology

## 2017-07-11 ENCOUNTER — Encounter (INDEPENDENT_AMBULATORY_CARE_PROVIDER_SITE_OTHER): Payer: Self-pay | Admitting: Ophthalmology

## 2017-07-11 DIAGNOSIS — H353131 Nonexudative age-related macular degeneration, bilateral, early dry stage: Secondary | ICD-10-CM

## 2017-07-11 DIAGNOSIS — H40053 Ocular hypertension, bilateral: Secondary | ICD-10-CM

## 2017-07-11 DIAGNOSIS — Z961 Presence of intraocular lens: Secondary | ICD-10-CM

## 2017-07-11 DIAGNOSIS — H26493 Other secondary cataract, bilateral: Secondary | ICD-10-CM

## 2017-08-09 ENCOUNTER — Other Ambulatory Visit: Payer: Self-pay | Admitting: *Deleted

## 2017-08-09 DIAGNOSIS — H353122 Nonexudative age-related macular degeneration, left eye, intermediate dry stage: Secondary | ICD-10-CM | POA: Diagnosis not present

## 2017-08-09 MED ORDER — SIMVASTATIN 40 MG PO TABS: 40.0000 mg | ORAL_TABLET | Freq: Every day | ORAL | 1 refills | Status: DC

## 2017-08-09 MED ORDER — LOSARTAN POTASSIUM 100 MG PO TABS: 100.0000 mg | ORAL_TABLET | Freq: Every day | ORAL | 1 refills | Status: DC

## 2017-08-09 NOTE — Telephone Encounter (Signed)
Patient requested 

## 2017-08-23 ENCOUNTER — Other Ambulatory Visit: Payer: Self-pay | Admitting: *Deleted

## 2017-08-23 ENCOUNTER — Telehealth: Payer: Self-pay

## 2017-08-23 MED ORDER — MELOXICAM 7.5 MG PO TABS: 7.5000 mg | ORAL_TABLET | ORAL | 3 refills | Status: DC | PRN

## 2017-08-23 MED ORDER — MELOXICAM 7.5 MG PO TABS: 7.5000 mg | ORAL_TABLET | Freq: Every day | ORAL | 3 refills | Status: DC | PRN

## 2017-08-23 NOTE — Telephone Encounter (Signed)
Patient requested Refill to [facility]

## 2017-08-23 NOTE — Telephone Encounter (Signed)
rx corrected and sent to [facility]

## 2017-08-23 NOTE — Telephone Encounter (Signed)
Antonio [name] with [facility] called requesting specific instructions for Mobic, states medication can not say as needed, that could indicate patient could take all 30 pills in one day.  Antonio [name] is requesting a new rx be submitted with specific instructions

## 2017-08-31 LAB — LIPID PANEL
Cholesterol: 130 (ref 0–200)
HDL: 68 (ref 35–70)
LDL Cholesterol: 49
Triglycerides: 97 (ref 40–160)

## 2017-08-31 LAB — BASIC METABOLIC PANEL: Glucose: 87

## 2017-09-08 DIAGNOSIS — H353122 Nonexudative age-related macular degeneration, left eye, intermediate dry stage: Secondary | ICD-10-CM | POA: Diagnosis not present

## 2017-09-14 ENCOUNTER — Encounter: Payer: Self-pay | Admitting: Internal Medicine

## 2017-10-08 DIAGNOSIS — H353122 Nonexudative age-related macular degeneration, left eye, intermediate dry stage: Secondary | ICD-10-CM | POA: Diagnosis not present

## 2017-10-20 ENCOUNTER — Telehealth: Payer: Self-pay

## 2017-10-20 MED ORDER — AZITHROMYCIN 250 MG PO TABS: ORAL_TABLET | ORAL | 0 refills | Status: DC

## 2017-10-20 NOTE — Telephone Encounter (Signed)
Patient called complaining of cold/upper respiratory/sinus symptoms  1. Fever? No 2. Chills? No 3. Myalgias? No 4. How long have you had your symptoms? 7-10 days 5. Are you coughing up mucus and if yes any discoloration? Yes, yellow drainage 6. What have you done for your symptoms thus far? Nyquil and mucinex 7. Have you been around anyone sick? Most family and friends sick, one family member went to the doctor several times for same symptoms   I will forward your response to your provider and call you with further instructions   Patient is traveling x 2 weeks, currently in [place]. [facility] [place], [facility] number [phone]

## 2017-10-20 NOTE — Telephone Encounter (Signed)
My recommendation is for patient to be seen, but since he's traveling and this is an unusual circumstance and symptoms have been going on for over a week, let's send in zithromax 250mg  two tabs today, then 1 tab daily x 4 days, take yogurt while on abx and drink plenty of water.  Also, continue with mucinex.  If cough is keeping him up, use a cough suppressant otc cough syrup.  Use warm humidity or vaporizer for congestion.

## 2017-10-20 NOTE — Telephone Encounter (Signed)
Patient notified and agreed. [facility] [place]. Spoke with Pharmacist.

## 2017-11-07 DIAGNOSIS — H353122 Nonexudative age-related macular degeneration, left eye, intermediate dry stage: Secondary | ICD-10-CM | POA: Diagnosis not present

## 2017-11-09 NOTE — Progress Notes (Signed)
[facility] Note  11/10/2017     CHIEF COMPLAINT Patient presents for Retina Follow Up   HISTORY OF PRESENT ILLNESS: Antonio [name] is a 77 y.o. male who presents to the clinic today for:   HPI    Retina Follow Up    Patient presents with  Other.  In both eyes.  This started 4 months ago.  Severity is mild.  Since onset it is stable.  I, the attending physician,  performed the HPI with the patient and updated documentation appropriately.          Comments    F/U NON EXU AMD OU.Patient states his vision is "normal the floaters maybe somewhat less". "I noticed  a bulls eye circle appx a week ago two times". Denies ocular pain. Pt takes Preservision qd . Denies gtts       Last edited by Bernarda Caffey, MD on 11/10/2017  8:53 AM. (History)    Pt presents as early return because of Foresee device alert. Pt complains that right eye is not being monitored by device. Questions validity of alert.  Referring physician: [name], DO [place], [place] [address]  HISTORICAL INFORMATION:   Selected notes from the [facility] for ARMD OU; Moved from [place], [place]  Ocular Hx - nonexudative ARMD OU; PVD OU; pseudophakia OU (Toric OU - 12/2016 by Dr. Nance Pear, [place], [place]);  PMH - HTN; [facility] - ARMD - Mother   CURRENT MEDICATIONS: No current outpatient medications on file. (Ophthalmic Drugs)   No current facility-administered medications for this visit.  (Ophthalmic Drugs)   Current Outpatient Medications (Other)  Medication Sig  . Cholecalciferol (VITAMIN D3) 2000 units TABS Take 1 tablet by mouth daily.  Marland Kitchen losartan (COZAAR) 100 MG tablet Take 1 tablet (100 mg total) by mouth daily.  . meloxicam (MOBIC) 7.5 MG tablet Take 1 tablet (7.5 mg total) daily as needed by mouth.  . Multiple Vitamins-Minerals (PRESERVISION AREDS 2 PO) Take 2 capsules by mouth daily.  . simvastatin (ZOCOR) 40 MG tablet Take 1 tablet  (40 mg total) by mouth daily.  Marland Kitchen azithromycin (ZITHROMAX) 250 MG tablet Take two tablets by mouth today, then one tablet by mouth once daily for 4 days (Patient not taking: Reported on 11/10/2017)   No current facility-administered medications for this visit.  (Other)      REVIEW OF SYSTEMS: ROS    Positive for: Endocrine, Eyes   Negative for: Constitutional, Gastrointestinal, Neurological, Skin, Genitourinary, Musculoskeletal, HENT, Cardiovascular, Respiratory, Psychiatric, Allergic/Imm, Heme/Lymph   Last edited by Zenovia Jordan, LPN on 01/26/8785  7:67 AM. (History)       ALLERGIES No Known Allergies  PAST MEDICAL HISTORY Past Medical History:  Diagnosis Date  . 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 BCC (basal cell carcinoma of skin)   . Carotid stenosis, asymptomatic, left   . Cataracts, bilateral   . Elevated diaphragm    right  . Erectile dysfunction   . Hyperlipidemia   . Hypertension   . Kidney stone   . 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 Melanoma ([place])   . Melanoma ([place])   . Posterior vitreous detachment   . SCC (squamous cell carcinoma)   . Status post joint replacement    right TKA   Past Surgical History:  Procedure Laterality Date  . CATARACT EXTRACTION Bilateral 01/2017   Dr. Merril Abbe [facility], [place])   .  CATARACT EXTRACTION W/ INTRAOCULAR LENS IMPLANT Bilateral 2018  . MELANOMA EXCISION  10/1999  . TOTAL KNEE ARTHROPLASTY  06/2009    FAMILY HISTORY Family History  Problem Relation Age of Onset  . Cataracts Mother   . Transient ischemic attack Mother   . Asthma Mother   . Cataracts Father   . Diabetes Father        borderline  . Hypertension Father   . Hyperlipidemia Father   . Hyperlipidemia Son   . Hypertension Son   . Amblyopia Neg Hx   . Blindness Neg Hx   . Glaucoma Neg Hx   . Macular degeneration Neg Hx   . Retinal detachment Neg Hx   . Strabismus Neg Hx   . Retinitis pigmentosa Neg Hx     SOCIAL  HISTORY Social History   Tobacco Use  . Smoking status: Never Smoker  . Smokeless tobacco: Never Used  Substance Use Topics  . Alcohol use: Yes    Comment: 5 to 7 drinks per week  . Drug use: No         OPHTHALMIC EXAM:  Base Eye Exam    Visual Acuity (Snellen - Linear)      Right Left   Dist cc 20/20 -1 20/20 -1   Dist ph cc NI NI   Correction:  Glasses       Tonometry (Tonopen, 8:40 AM)      Right Left   Pressure 12 10       Pupils      Dark Light Shape React APD   Right 3 2 Round Brisk None   Left 3 2 Round Brisk None       Visual Fields (Counting fingers)      Left Right    Full Full       Extraocular Movement      Right Left    Full, Ortho Full, Ortho       Neuro/Psych    Oriented x3:  Yes   Mood/Affect:  Normal       Dilation    Both eyes:  1.0% Mydriacyl, 2.5% Phenylephrine @ 8:40 AM        Slit Lamp and Fundus Exam    External Exam      Right Left   External Brow ptosis - mild Brow ptosis - mild       Slit Lamp Exam      Right Left   Lids/Lashes Dermatochalasis - upper lid Dermatochalasis - upper lid   Conjunctiva/Sclera White and quiet White and quiet   Cornea Clear; trace endo pigment; well healed femoto laser wound Clear; trace endo pigment; well healed femoto laser wound   Anterior Chamber Deep and quiet Deep and quiet   Iris Round with moderate dilated to 45mm Round with moderate dilated to 89mm   Lens Posterior chamber intraocular lens in good postion, 1-2+ Posterior capsular opacification Posterior chamber intraocular lens in good postion; 1+ Posterior capsular opacification   Vitreous Vitreous syneresis, Posterior vitreous detachment Vitreous syneresis, Posterior vitreous detachment       Fundus Exam      Right Left   Disc compact with Peripapillary atrophy 360 compact with Peripapillary atrophy 360   C/D Ratio 0.5 0.5   Macula Drusen, Retinal pigment epithelial mottling and clumping  Flat; Drusen, Retinal pigment epithelial  mottling and clumping    Vessels AV crossing changes - mild AV crossing changes - mild   Periphery Attached; Reticular degeneration;  Attached; Reticular  degeneration;           IMAGING AND PROCEDURES  Imaging and Procedures for 11/10/17  OCT, Retina - OU - Both Eyes     Right Eye Quality was good. Central Foveal Thickness: 316. Progression has been stable. Findings include normal foveal contour, no IRF, no SRF, retinal drusen  (Focal areas of outter retinal atrophy / ellipsoid dropout; scattered drusen).   Left Eye Quality was good. Central Foveal Thickness: 318. Progression has been stable. Findings include normal foveal contour, no IRF, no SRF, retinal drusen  (Scattered drusen; ).   Notes Images stored on drive  Diagnosis / Impression:  Nonexudative ARMD OU  Clinical management:  See below  Abbreviations: NFP - Normal foveal profile. CME - cystoid macular edema. PED - pigment epithelial detachment. IRF - intraretinal fluid. SRF - subretinal fluid. EZ - ellipsoid zone. ERM - epiretinal membrane. ORA - outer retinal atrophy. ORT - outer retinal tubulation. SRHM - subretinal hyper-reflective material                  ASSESSMENT/PLAN:    ICD-10-CM   1. Intermediate stage nonexudative age-related macular degeneration of both eyes H35.3132 OCT, Retina - OU - Both Eyes  2. Pseudophakia of both eyes Z96.1   3. 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 Bilateral posterior capsular opacification H26.493     1. Age related macular degeneration, non-exudative, both eyes  - The incidence, anatomy, and pathology of dry AMD, risk of progression, and the AREDS and AREDS 2 study including smoking risks discussed with patient.  - has a Foresee AMD monitoring device at home - which fired an alert  - no progression on exam or OCT  - f/u 6 mos, sooner prn  2. Pseudophakia OU  - s/p CE/PCIOL OU (Toric + femto) 12/2016 by Dr. Nance Pear in [facility]  - had post op IOP issue OS  -  beautiful surgery, doing well  - monitor  3. H/o ocular hypertension OU  - IOP good today off combigan  - monitor  4. PCO OU  - Discussed YAG Cap treatment for PCO;  - not yet visually significant  - monitor for now  Ophthalmic Meds Ordered this visit:  No orders of the defined types were placed in this encounter.      Return in about 6 months (around 05/10/2018), or if symptoms worsen or fail to improve, for Dilated Exam, OCT.  There are no Patient Instructions on file for this visit.   Explained the diagnoses, plan, and follow up with the patient and they expressed understanding.  Patient expressed understanding of the importance of proper follow up care.   This document serves as a record of services personally performed by Gardiner Sleeper, MD, PhD. It was created on their behalf by Catha Brow, [place], a certified ophthalmic assistant. The creation of this record is the provider's dictation and/or activities during the visit.  Electronically signed by: Catha Brow, COA  11/10/17 9:32 AM    Gardiner Sleeper, M.D., Ph.D. Vitreoretinal Surgeon [facility] 11/10/17   I have reviewed the above documentation for accuracy and completeness, and I agree with the above. Gardiner Sleeper, M.D., Ph.D. 11/10/17 9:32 AM    Abbreviations: M myopia (nearsighted); A astigmatism; H hyperopia (farsighted); P presbyopia; Mrx spectacle prescription;  CTL contact lenses; OD right eye; OS left eye; OU both eyes  XT exotropia; ET esotropia; PEK punctate epithelial keratitis; PEE punctate epithelial erosions; DES  dry eye syndrome; MGD meibomian gland dysfunction; ATs artificial tears; PFAT's preservative free artificial tears; [place] nuclear sclerotic cataract; PSC posterior subcapsular cataract; ERM epi-retinal membrane; PVD posterior vitreous detachment; RD retinal detachment; DM diabetes mellitus; DR diabetic retinopathy; NPDR non-proliferative diabetic retinopathy; PDR  proliferative diabetic retinopathy; CSME clinically significant macular edema; DME diabetic macular edema; dbh dot blot hemorrhages; CWS cotton wool spot; POAG primary open angle glaucoma; C/D cup-to-disc ratio; HVF humphrey visual field; GVF goldmann visual field; OCT optical coherence tomography; IOP intraocular pressure; BRVO Branch retinal vein occlusion; CRVO central retinal vein occlusion; CRAO central retinal artery occlusion; BRAO branch retinal artery occlusion; RT retinal tear; SB scleral buckle; PPV pars plana vitrectomy; VH Vitreous hemorrhage; PRP panretinal laser photocoagulation; IVK intravitreal kenalog; VMT vitreomacular traction; MH Macular hole;  NVD neovascularization of the disc; NVE neovascularization elsewhere; AREDS age related eye disease study; ARMD age related macular degeneration; POAG primary open angle glaucoma; EBMD epithelial/anterior basement membrane dystrophy; ACIOL anterior chamber intraocular lens; IOL intraocular lens; PCIOL posterior chamber intraocular lens; Phaco/IOL phacoemulsification with intraocular lens placement; [place] photorefractive keratectomy; LASIK laser assisted in situ keratomileusis; HTN hypertension; DM diabetes mellitus; COPD chronic obstructive pulmonary disease

## 2017-11-10 ENCOUNTER — Ambulatory Visit (INDEPENDENT_AMBULATORY_CARE_PROVIDER_SITE_OTHER): Payer: PPO | Admitting: Ophthalmology

## 2017-11-10 ENCOUNTER — Encounter (INDEPENDENT_AMBULATORY_CARE_PROVIDER_SITE_OTHER): Payer: Self-pay | Admitting: Ophthalmology

## 2017-11-10 DIAGNOSIS — H353132 Nonexudative age-related macular degeneration, bilateral, intermediate dry stage: Secondary | ICD-10-CM

## 2017-11-10 DIAGNOSIS — Z961 Presence of intraocular lens: Secondary | ICD-10-CM

## 2017-11-10 DIAGNOSIS — H26493 Other secondary cataract, bilateral: Secondary | ICD-10-CM | POA: Diagnosis not present

## 2017-11-10 DIAGNOSIS — H40053 Ocular hypertension, bilateral: Secondary | ICD-10-CM | POA: Diagnosis not present

## 2017-11-23 DIAGNOSIS — Z85828 Personal history of other malignant neoplasm of skin: Secondary | ICD-10-CM | POA: Diagnosis not present

## 2017-11-23 DIAGNOSIS — L820 Inflamed seborrheic keratosis: Secondary | ICD-10-CM | POA: Diagnosis not present

## 2017-11-23 DIAGNOSIS — Z85820 Personal history of malignant melanoma of skin: Secondary | ICD-10-CM | POA: Diagnosis not present

## 2017-11-23 DIAGNOSIS — L821 Other seborrheic keratosis: Secondary | ICD-10-CM | POA: Diagnosis not present

## 2017-11-23 DIAGNOSIS — Q828 Other specified congenital malformations of skin: Secondary | ICD-10-CM | POA: Diagnosis not present

## 2017-11-23 DIAGNOSIS — L570 Actinic keratosis: Secondary | ICD-10-CM | POA: Diagnosis not present

## 2017-11-23 DIAGNOSIS — D485 Neoplasm of uncertain behavior of skin: Secondary | ICD-10-CM | POA: Diagnosis not present

## 2017-11-23 DIAGNOSIS — D1801 Hemangioma of skin and subcutaneous tissue: Secondary | ICD-10-CM | POA: Diagnosis not present

## 2017-11-24 DIAGNOSIS — M1712 Unilateral primary osteoarthritis, left knee: Secondary | ICD-10-CM | POA: Diagnosis not present

## 2017-11-24 DIAGNOSIS — S76312A Strain of muscle, fascia and tendon of the posterior muscle group at thigh level, left thigh, initial encounter: Secondary | ICD-10-CM | POA: Diagnosis not present

## 2017-11-24 DIAGNOSIS — M7632 Iliotibial band syndrome, left leg: Secondary | ICD-10-CM | POA: Diagnosis not present

## 2017-11-28 ENCOUNTER — Encounter (INDEPENDENT_AMBULATORY_CARE_PROVIDER_SITE_OTHER): Payer: PPO | Admitting: Ophthalmology

## 2017-12-07 DIAGNOSIS — H353122 Nonexudative age-related macular degeneration, left eye, intermediate dry stage: Secondary | ICD-10-CM | POA: Diagnosis not present

## 2018-01-06 DIAGNOSIS — H353122 Nonexudative age-related macular degeneration, left eye, intermediate dry stage: Secondary | ICD-10-CM | POA: Diagnosis not present

## 2018-01-22 ENCOUNTER — Telehealth: Payer: Self-pay

## 2018-01-22 DIAGNOSIS — H3530 Unspecified macular degeneration: Secondary | ICD-10-CM

## 2018-01-22 NOTE — Telephone Encounter (Signed)
Referral order completed. Pt is at [facility] and she comes there.

## 2018-01-22 NOTE — Telephone Encounter (Signed)
Patient's wife called requesting referral to Dr.McCuen, ophthalmology   Please advise, if in agreement please complete pending referral

## 2018-01-29 ENCOUNTER — Encounter: Payer: Self-pay | Admitting: Internal Medicine

## 2018-02-05 DIAGNOSIS — H353122 Nonexudative age-related macular degeneration, left eye, intermediate dry stage: Secondary | ICD-10-CM | POA: Diagnosis not present

## 2018-02-12 DIAGNOSIS — S76312A Strain of muscle, fascia and tendon of the posterior muscle group at thigh level, left thigh, initial encounter: Secondary | ICD-10-CM | POA: Diagnosis not present

## 2018-02-12 DIAGNOSIS — M7632 Iliotibial band syndrome, left leg: Secondary | ICD-10-CM | POA: Diagnosis not present

## 2018-02-13 ENCOUNTER — Non-Acute Institutional Stay: Payer: PPO

## 2018-02-13 VITALS — BP 152/68 | HR 101 | Temp 97.5°F | Ht 72.0 in | Wt 193.0 lb

## 2018-02-13 DIAGNOSIS — Z0000 Encounter for general adult medical examination without abnormal findings: Secondary | ICD-10-CM

## 2018-02-13 NOTE — Patient Instructions (Addendum)
Antonio [name] , Thank you for taking time to come for your Medicare Wellness Visit. I appreciate your ongoing commitment to your health goals. Please review the following plan we discussed and let me know if I can assist you in the future.   Screening recommendations/referrals: Colonoscopy excluded, you are over age 79 Recommended yearly ophthalmology/optometry visit for glaucoma screening and checkup Recommended yearly dental visit for hygiene and checkup  Vaccinations: Influenza vaccine due 2019 fall season Pneumococcal vaccine up to date, completed Tdap vaccine up to date, due 03/04/2024 Shingles vaccine up to date, completed    Advanced directives: In Chart  Conditions/risks identified: None  Next appointment: Dr. Mariea Clonts 02/28/2018 @ 10am  Preventive Care 65 Years and Older, Male Preventive care refers to lifestyle choices and visits with your health care provider that can promote health and wellness. What does preventive care include?  A yearly physical exam. This is also called an annual well check.  Dental exams once or twice a year.  Routine eye exams. Ask your health care provider how often you should have your eyes checked.  Personal lifestyle choices, including:  Daily care of your teeth and gums.  Regular physical activity.  Eating a healthy diet.  Avoiding tobacco and drug use.  Limiting alcohol use.  Practicing safe sex.  Taking low doses of aspirin every day.  Taking vitamin and mineral supplements as recommended by your health care provider. What happens during an annual well check? The services and screenings done by your health care provider during your annual well check will depend on your age, overall health, lifestyle risk factors, and family history of disease. Counseling  Your health care provider may ask you questions about your:  Alcohol use.  Tobacco use.  Drug use.  Emotional well-being.  Home and relationship well-being.  Sexual  activity.  Eating habits.  History of falls.  Memory and ability to understand (cognition).  Work and work Statistician. Screening  You may have the following tests or measurements:  Height, weight, and BMI.  Blood pressure.  Lipid and cholesterol levels. These may be checked every 5 years, or more frequently if you are over 52 years old.  Skin check.  Lung cancer screening. You may have this screening every year starting at age 17 if you have a 30-pack-year history of smoking and currently smoke or have quit within the past 15 years.  Fecal occult blood test (FOBT) of the stool. You may have this test every year starting at age 14.  Flexible sigmoidoscopy or colonoscopy. You may have a sigmoidoscopy every 5 years or a colonoscopy every 10 years starting at age 42.  Prostate cancer screening. Recommendations will vary depending on your family history and other risks.  Hepatitis C blood test.  Hepatitis B blood test.  Sexually transmitted disease (STD) testing.  Diabetes screening. This is done by checking your blood sugar (glucose) after you have not eaten for a while (fasting). You may have this done every 1-3 years.  Abdominal aortic aneurysm (AAA) screening. You may need this if you are a current or former smoker.  Osteoporosis. You may be screened starting at age 74 if you are at high risk. Talk with your health care provider about your test results, treatment options, and if necessary, the need for more tests. Vaccines  Your health care provider may recommend certain vaccines, such as:  Influenza vaccine. This is recommended every year.  Tetanus, diphtheria, and acellular pertussis (Tdap, Td) vaccine. You may need a  Td booster every 10 years.  Zoster vaccine. You may need this after age 86.  Pneumococcal 13-valent conjugate (PCV13) vaccine. One dose is recommended after age 70.  Pneumococcal polysaccharide (PPSV23) vaccine. One dose is recommended after age  69. Talk to your health care provider about which screenings and vaccines you need and how often you need them. This information is not intended to replace advice given to you by your health care provider. Make sure you discuss any questions you have with your health care provider. Document Released: 10/30/2015 Document Revised: 06/22/2016 Document Reviewed: 08/04/2015 Elsevier Interactive Patient Education  2017 [place] Prevention in the Home Falls can cause injuries. They can happen to people of all ages. There are many things you can do to make your home safe and to help prevent falls. What can I do on the outside of my home?  Regularly fix the edges of walkways and driveways and fix any cracks.  Remove anything that might make you trip as you walk through a door, such as a raised step or threshold.  Trim any bushes or trees on the path to your home.  Use bright outdoor lighting.  Clear any walking paths of anything that might make someone trip, such as rocks or tools.  Regularly check to see if handrails are loose or broken. Make sure that both sides of any steps have handrails.  Any raised decks and porches should have guardrails on the edges.  Have any leaves, snow, or ice cleared regularly.  Use sand or salt on walking paths during winter.  Clean up any spills in your garage right away. This includes oil or grease spills. What can I do in the bathroom?  Use night lights.  Install grab bars by the toilet and in the tub and shower. Do not use towel bars as grab bars.  Use non-skid mats or decals in the tub or shower.  If you need to sit down in the shower, use a plastic, non-slip stool.  Keep the floor dry. Clean up any water that spills on the floor as soon as it happens.  Remove soap buildup in the tub or shower regularly.  Attach bath mats securely with double-sided non-slip rug tape.  Do not have throw rugs and other things on the floor that can make  you trip. What can I do in the bedroom?  Use night lights.  Make sure that you have a light by your bed that is easy to reach.  Do not use any sheets or blankets that are too big for your bed. They should not hang down onto the floor.  Have a firm chair that has side arms. You can use this for support while you get dressed.  Do not have throw rugs and other things on the floor that can make you trip. What can I do in the kitchen?  Clean up any spills right away.  Avoid walking on wet floors.  Keep items that you use a lot in easy-to-reach places.  If you need to reach something above you, use a strong step stool that has a grab bar.  Keep electrical cords out of the way.  Do not use floor polish or wax that makes floors slippery. If you must use wax, use non-skid floor wax.  Do not have throw rugs and other things on the floor that can make you trip. What can I do with my stairs?  Do not leave any items on the stairs.  Make sure that there are handrails on both sides of the stairs and use them. Fix handrails that are broken or loose. Make sure that handrails are as long as the stairways.  Check any carpeting to make sure that it is firmly attached to the stairs. Fix any carpet that is loose or worn.  Avoid having throw rugs at the top or bottom of the stairs. If you do have throw rugs, attach them to the floor with carpet tape.  Make sure that you have a light switch at the top of the stairs and the bottom of the stairs. If you do not have them, ask someone to add them for you. What else can I do to help prevent falls?  Wear shoes that:  Do not have high heels.  Have rubber bottoms.  Are comfortable and fit you well.  Are closed at the toe. Do not wear sandals.  If you use a stepladder:  Make sure that it is fully opened. Do not climb a closed stepladder.  Make sure that both sides of the stepladder are locked into place.  Ask someone to hold it for you, if  possible.  Clearly mark and make sure that you can see:  Any grab bars or handrails.  First and last steps.  Where the edge of each step is.  Use tools that help you move around (mobility aids) if they are needed. These include:  Canes.  Walkers.  Scooters.  Crutches.  Turn on the lights when you go into a dark area. Replace any light bulbs as soon as they burn out.  Set up your furniture so you have a clear path. Avoid moving your furniture around.  If any of your floors are uneven, fix them.  If there are any pets around you, be aware of where they are.  Review your medicines with your doctor. Some medicines can make you feel dizzy. This can increase your chance of falling. Ask your doctor what other things that you can do to help prevent falls. This information is not intended to replace advice given to you by your health care provider. Make sure you discuss any questions you have with your health care provider. Document Released: 07/30/2009 Document Revised: 03/10/2016 Document Reviewed: 11/07/2014 Elsevier Interactive Patient Education  2017 [employer].

## 2018-02-13 NOTE — Progress Notes (Signed)
Subjective:   Antonio [name] is a 77 y.o. male who presents for Medicare Annual/Subsequent preventive examination at [facility]    Objective:    Vitals: BP (!) 152/68 (BP Location: Left Arm, Patient Position: Sitting)   Pulse (!) 101   Temp (!) 97.5 F (36.4 C) (Oral)   Ht 6' (1.829 m)   Wt 193 lb (87.5 kg)   SpO2 96%   BMI 26.18 kg/m   Body mass index is 26.18 kg/m.  Provider notified of Vital Signs  Advanced Directives 02/13/2018 06/21/2017  Does Patient Have a Medical Advance Directive? Yes Yes  Type of Paramedic of [place];Living will;Out of facility DNR (pink MOST or yellow form)  Does patient want to make changes to medical advance directive? No - Patient declined -  Copy of [place] in Chart? Yes No - copy requested  Pre-existing out of facility DNR order (yellow form or pink MOST form) - Yellow form placed in chart (order not valid for inpatient use)    Tobacco Social History   Tobacco Use  Smoking Status Never Smoker  Smokeless Tobacco Never Used     Counseling given: Not Answered   Clinical Intake:  Pre-visit preparation completed: No  Pain : No/denies pain     Nutritional Risks: None Diabetes: No  How often do you need to have someone help you when you read instructions, pamphlets, or other written materials from your doctor or pharmacy?: 1 - Never What is the last grade level you completed in school?: College  Interpreter Needed?: No  Information entered by :: Tyson Dense, RN  Past Medical History:  Diagnosis Date  . 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 BCC (basal cell carcinoma of skin)   . Carotid stenosis, asymptomatic, left   . Cataracts, bilateral   . Elevated diaphragm    right  . Erectile dysfunction   . Hyperlipidemia   . Hypertension   . Kidney stone   . 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 Melanoma  ([place])   . Melanoma ([place])   . Posterior vitreous detachment   . SCC (squamous cell carcinoma)   . Status post joint replacement    right TKA   Past Surgical History:  Procedure Laterality Date  . CATARACT EXTRACTION Bilateral 01/2017   Dr. Merril Abbe [facility], [place]   . CATARACT EXTRACTION W/ INTRAOCULAR LENS IMPLANT Bilateral 2018  . MELANOMA EXCISION  10/1999  . TOTAL KNEE ARTHROPLASTY  06/2009   Family History  Problem Relation Age of Onset  . Cataracts Mother   . Transient ischemic attack Mother   . Asthma Mother   . Cataracts Father   . Diabetes Father        borderline  . Hypertension Father   . Hyperlipidemia Father   . Hyperlipidemia Son   . Hypertension Son   . Amblyopia Neg Hx   . Blindness Neg Hx   . Glaucoma Neg Hx   . Macular degeneration Neg Hx   . Retinal detachment Neg Hx   . Strabismus Neg Hx   . Retinitis pigmentosa Neg Hx    Social History   Socioeconomic History  . Marital status: Married    Spouse name: Not on file  . Number of children: Not on file  . Years of education: Not on file  . Highest education level: Not on file  Occupational History  . Not  on file  Social Needs  . Financial resource strain: Not hard at all  . Food insecurity:    Worry: Never true    Inability: Never true  . Transportation needs:    Medical: No    Non-medical: No  Tobacco Use  . Smoking status: Never Smoker  . Smokeless tobacco: Never Used  Substance and Sexual Activity  . Alcohol use: Yes    Comment: 7 to 9 drinks per week  . Drug use: No  . Sexual activity: Not Currently  Lifestyle  . Physical activity:    Days per week: 2 days    Minutes per session: 20 min  . Stress: To some extent  Relationships  . Social connections:    Talks on phone: More than three times a week    Gets together: More than three times a week    Attends religious service: More than 4 times per year    Active member of club or organization: Yes    Attends meetings of clubs  or organizations: More than 4 times per year    Relationship status: Married  Other Topics Concern  . Not on file  Social History Narrative   Social History      Diet?good      Do you drink/eat things with caffeine? seldom      Marital status?          yes                          What year were you married? 1962      Do you live in a house, apartment, assisted living, condo, trailer, etc.? house      Is it one or more stories?1      How many persons live in your home? 2      Do you have any pets in your home? (please list) no      Highest level of education completed? college      Current or past profession: president and [employer]      Do you exercise?   yes                                   Type & how often? 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 yes      Do you have a DNR form?       yes                            If not, do you want to discuss one?      Do you have signed POA/HPOA for forms?  yes      Functional Status      Do you have difficulty bathing or dressing yourself? no      Do you have difficulty preparing food or eating? no      Do you have difficulty managing your medications? no      Do you have difficulty managing your finances? no      Do you have difficulty affording your medications? no    Outpatient Encounter Medications as of 02/13/2018  Medication Sig  . Cholecalciferol (VITAMIN D3) 2000 units TABS Take 1 tablet by mouth daily.  Marland Kitchen losartan (COZAAR)  100 MG tablet Take 1 tablet (100 mg total) by mouth daily.  . meloxicam (MOBIC) 7.5 MG tablet Take 7.5 mg by mouth 2 (two) times daily.  . Multiple Vitamins-Minerals (PRESERVISION AREDS 2 PO) Take 2 capsules by mouth daily.  . simvastatin (ZOCOR) 40 MG tablet Take 1 tablet (40 mg total) by mouth daily.  . [DISCONTINUED] meloxicam (MOBIC) 7.5 MG tablet Take 1 tablet (7.5 mg total) daily as needed by mouth. (Patient taking differently: Take 7.5 mg by  mouth 2 (two) times daily. )  . [DISCONTINUED] azithromycin (ZITHROMAX) 250 MG tablet Take two tablets by mouth today, then one tablet by mouth once daily for 4 days (Patient not taking: Reported on 11/10/2017)   No facility-administered encounter medications on file as of 02/13/2018.     Activities of Daily Living In your present state of health, do you have any difficulty performing the following activities: 02/13/2018  Hearing? N  Vision? N  Difficulty concentrating or making decisions? Y  Walking or climbing stairs? N  Dressing or bathing? N  Doing errands, shopping? N  Preparing Food and eating ? N  Using the Toilet? N  In the past six months, have you accidently leaked urine? N  Do you have problems with loss of bowel control? N  Managing your Medications? N  Managing your Finances? N  Housekeeping or managing your Housekeeping? N  Some recent data might be hidden    Patient Care Team: [name], DO as PCP - General (Geriatric Medicine)   Assessment:   This is a routine wellness examination for Gwyndolyn Saxon.  Exercise Activities and Dietary recommendations Current Exercise Habits: Home exercise routine, Type of exercise: walking, Time (Minutes): 20, Frequency (Times/Week): 2, Weekly Exercise (Minutes/Week): 40, Intensity: Mild, Exercise limited by: None identified  Goals    None      Fall Risk Fall Risk  02/13/2018 06/21/2017  Falls in the past year? No No   Is the patient's home free of loose throw rugs in walkways, pet beds, electrical cords, etc?   yes      Grab bars in the bathroom? yes      Handrails on the stairs?   yes      Adequate lighting?   yes  Depression Screen PHQ 2/9 Scores 02/13/2018 06/21/2017  PHQ - 2 Score 0 0    Cognitive Function MMSE - Mini Mental State Exam 02/13/2018  Orientation to time 3  Orientation to Place 5  Registration 3  Attention/ Calculation 5  Recall 2  Language- name 2 objects 2  Language- repeat 1  Language- follow 3 step  command 3  Language- read & follow direction 1  Write a sentence 1  Copy design 1  Total score 27        Immunization History  Administered Date(s) Administered  . Pneumococcal Conjugate-13 03/04/2014  . Pneumococcal Polysaccharide-23 07/27/2010  . Tdap 03/04/2014  . Zoster 09/22/2006  . Zoster Recombinat (Shingrix) 10/06/2017, 12/09/2017    Qualifies for Shingles Vaccine? No, up to date  Screening Tests Health Maintenance  Topic Date Due  . INFLUENZA VACCINE  05/17/2018  . TETANUS/TDAP  03/04/2024  . PNA vac Low Risk Adult  Completed   Cancer Screenings: Lung: Low Dose CT Chest recommended if Age 49-80 years, 30 pack-year currently smoking OR have quit w/in 15years. Patient does not qualify. Colorectal: up to date  Additional Screenings:  Hepatitis C Screening:declined      Plan:    I have personally reviewed  and addressed the Medicare Annual Wellness questionnaire and have noted the following in the patient's chart:  A. Medical and social history B. Use of alcohol, tobacco or illicit drugs  C. Current medications and supplements D. Functional ability and status E.  Nutritional status F.  Physical activity G. Advance directives H. List of other physicians I.  Hospitalizations, surgeries, and ER visits in previous 12 months J.  [place] to include hearing, vision, cognitive, depression L. Referrals and appointments - none  In addition, I have reviewed and discussed with patient certain preventive protocols, quality metrics, and best practice recommendations. A written personalized care plan for preventive services as well as general preventive health recommendations were provided to patient.  See attached scanned questionnaire for additional information.   Signed,   Tyson Dense, RN Nurse Health Advisor  Patient Concerns: None

## 2018-02-28 ENCOUNTER — Encounter: Payer: Self-pay | Admitting: Internal Medicine

## 2018-02-28 ENCOUNTER — Non-Acute Institutional Stay: Payer: PPO | Admitting: Internal Medicine

## 2018-02-28 VITALS — BP 122/70 | HR 90 | Temp 97.8°F | Ht 72.0 in | Wt 194.0 lb

## 2018-02-28 DIAGNOSIS — K635 Polyp of colon: Secondary | ICD-10-CM | POA: Diagnosis not present

## 2018-02-28 DIAGNOSIS — I10 Essential (primary) hypertension: Secondary | ICD-10-CM | POA: Diagnosis not present

## 2018-02-28 DIAGNOSIS — Z8572 Personal history of non-Hodgkin lymphomas: Secondary | ICD-10-CM | POA: Diagnosis not present

## 2018-02-28 DIAGNOSIS — H6121 Impacted cerumen, right ear: Secondary | ICD-10-CM

## 2018-02-28 DIAGNOSIS — E7800 Pure hypercholesterolemia, unspecified: Secondary | ICD-10-CM | POA: Diagnosis not present

## 2018-02-28 DIAGNOSIS — R05 Cough: Secondary | ICD-10-CM

## 2018-02-28 DIAGNOSIS — Z0000 Encounter for general adult medical examination without abnormal findings: Secondary | ICD-10-CM | POA: Diagnosis not present

## 2018-02-28 DIAGNOSIS — R059 Cough, unspecified: Secondary | ICD-10-CM

## 2018-02-28 MED ORDER — MELOXICAM 7.5 MG PO TABS: 7.5000 mg | ORAL_TABLET | Freq: Two times a day (BID) | ORAL | 1 refills | Status: DC

## 2018-02-28 MED ORDER — HYDROCOD POLST-CPM POLST ER 10-8 MG/5ML PO SUER: 5.0000 mL | Freq: Two times a day (BID) | ORAL | 0 refills | Status: DC | PRN

## 2018-02-28 NOTE — Progress Notes (Signed)
Location:  Occupational psychologist of Service:  Clinic (12)  Provider: Bandon Sherwin L. Mariea Clonts, D.O., C.M.D.  Code Status: full code Goals of Care:  Advanced Directives 02/13/2018  Does Patient Have a Medical Advance Directive? Yes  Type of Advance Directive [place]  Does patient want to make changes to medical advance directive? No - Patient declined  Copy of [place] in Chart? Yes  Pre-existing out of facility DNR order (yellow form or pink MOST form) -   Chief Complaint  Patient presents with  . Annual Exam    CPE    HPI: Patient is a 77 y.o. male seen today for an annual exam.    BP was very good today.  It had been elevated at [facility] and when he rechecked at home.  Checked himself at home and bad.  He's had no symptoms.  Life remains complex at all times.    He asked for meloxicam refill.  Knee doctor was also giving it to him.  We are giving him 86.  He may be coming off of it at the end of the month.  Left knee is improving.  He is applying diclofenac twice a day.  It's not painful now if he plants his foot and turns.  Much less painful.  At first though to be hamstring.  Had good cartilage but felt to be a meniscal tear.  Gets an annual cough with drainage.  He's trying to stop that.  He's wondering if he can have a good cough syrup for a short time.    He's a past smoker but not otherwise high risk for osteoporosis--had a screening done and was told he was.    He has a skin tag on the right side of the neck.  Sees derm tomorrow.    He thinks his right ear is a problem due to cough and drainage.  Might have wax in the right ear though.  Wears hearing aids.  Had his second shingles vaccine. Going to [place], [place], [place].  Discussed importance of measles vaccine pre-foreign travel these days and checking with travel clinic to make sure he has the vaccines he needs.    He no longer needed to be followed by  hematology in [facility] but had h/o lymphoma so regular labs were being done by his PCP to check for this, he reports (was seen once a year for a wellness visit based on what we received).    Past Medical History:  Diagnosis Date  . 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 BCC (basal cell carcinoma of skin)   . Carotid stenosis, asymptomatic, left   . Cataracts, bilateral   . Elevated diaphragm    right  . Erectile dysfunction   . Hyperlipidemia   . Hypertension   . Kidney stone   . Lymphoma of testis ([place])   . Melanoma ([place])   . Melanoma ([place])   . Posterior vitreous detachment   . SCC (squamous cell carcinoma)   . Status post joint replacement    right TKA    Past Surgical History:  Procedure Laterality Date  . CATARACT EXTRACTION Bilateral 01/2017   Dr. Merril Abbe [facility], [place]   . CATARACT EXTRACTION W/ INTRAOCULAR LENS IMPLANT Bilateral 2018  . MELANOMA EXCISION  10/1999  . TOTAL KNEE ARTHROPLASTY  06/2009    No Known Allergies  Outpatient Encounter Medications as of 02/28/2018  Medication Sig  .  Cholecalciferol (VITAMIN D3) 2000 units TABS Take 1 tablet by mouth daily.  . diclofenac sodium (VOLTAREN) 1 % GEL Apply 2 g topically 3 (three) times daily.  Marland Kitchen losartan (COZAAR) 100 MG tablet Take 1 tablet (100 mg total) by mouth daily.  . meloxicam (MOBIC) 7.5 MG tablet Take 1 tablet (7.5 mg total) by mouth 2 (two) times daily.  . Multiple Vitamins-Minerals (PRESERVISION AREDS 2 PO) Take 2 capsules by mouth daily.  . simvastatin (ZOCOR) 40 MG tablet Take 1 tablet (40 mg total) by mouth daily.  . [DISCONTINUED] meloxicam (MOBIC) 7.5 MG tablet Take 7.5 mg by mouth 2 (two) times daily.  . chlorpheniramine-HYDROcodone (TUSSIONEX PENNKINETIC ER) 10-8 MG/5ML SUER Take 5 mLs by mouth every 12 (twelve) hours as needed for cough.   No facility-administered encounter medications on file as of 02/28/2018.     Review of Systems:  Review of Systems    Constitutional: Negative for chills, fever and malaise/fatigue.  HENT: Positive for congestion, hearing loss and sinus pain. Negative for sore throat.        Cerumen buildup  Eyes: Negative for blurred vision.       Glasses; macular degeneration bilaterally  Respiratory: Positive for cough and sputum production. Negative for shortness of breath and wheezing.   Cardiovascular: Negative for chest pain, palpitations and leg swelling.  Gastrointestinal: Negative for abdominal pain, blood in stool, constipation, diarrhea and melena.  Genitourinary: Negative for dysuria.  Musculoskeletal: Positive for joint pain.       Left knee  Skin:       Skin tag on neck  Neurological: Negative for dizziness and loss of consciousness.  Endo/Heme/Allergies: Does not bruise/bleed easily.  Psychiatric/Behavioral: Negative for depression and memory loss. The patient is not nervous/anxious and does not have insomnia.     Health Maintenance  Topic Date Due  . INFLUENZA VACCINE  05/17/2018  . TETANUS/TDAP  03/04/2024  . PNA vac Low Risk Adult  Completed    Physical Exam: Vitals:   02/28/18 1004  BP: 122/70  Pulse: 90  Temp: 97.8 F (36.6 C)  TempSrc: Oral  SpO2: 97%  Weight: 194 lb (88 kg)  Height: 6' (1.829 m)   Body mass index is 26.31 kg/m. Physical Exam  Constitutional: He is oriented to person, place, and time. He appears well-developed and well-nourished. No distress.  HENT:  Head: Normocephalic and atraumatic.  Right Ear: External ear normal.  Left Ear: External ear normal.  Nose: Nose normal.  Mouth/Throat: Oropharynx is clear and moist.  Bilateral cerumen impaction  Eyes: Pupils are equal, round, and reactive to light. Conjunctivae and EOM are normal.  glasses  Neck: Normal range of motion. Neck supple. No JVD present. No tracheal deviation present. No thyromegaly present.  Cardiovascular: Normal rate, regular rhythm, normal heart sounds and intact distal pulses.   Pulmonary/Chest: Effort normal and breath sounds normal. No respiratory distress.  Abdominal: Soft. Bowel sounds are normal. He exhibits no distension and no mass. There is no tenderness. There is no rebound and no guarding.  Musculoskeletal: Normal range of motion. He exhibits tenderness.  Left lateral knee  Lymphadenopathy:    He has no cervical adenopathy.  Neurological: He is alert and oriented to person, place, and time. No cranial nerve deficit.  Skin: Skin is warm and dry. Capillary refill takes less than 2 seconds.  Psychiatric: He has a normal mood and affect. His behavior is normal. Judgment and thought content normal.    Labs reviewed: Basic Metabolic Panel:  Recent Labs    07/04/17 03/01/18 0600  NA 141 141  K 4.7 6.0*  BUN 19 17  CREATININE 0.7 0.7   Liver Function Tests: Recent Labs    07/04/17 03/01/18 0600  AST 30 28  ALT 37 31  ALKPHOS 66 65   No results for input(s): LIPASE, AMYLASE in the last 8760 hours. No results for input(s): AMMONIA in the last 8760 hours. CBC: Recent Labs    07/04/17 03/01/18 0600  WBC 7.1 6.2  HGB 15.2 15.4  HCT 47 46  PLT 232 192   Lipid Panel: Recent Labs    07/04/17 08/31/17 1009 03/01/18 0600  CHOL 171 130 181  HDL 77* 68 75*  LDLCALC 80 49 88  TRIG 71 97 93   Lab Results  Component Value Date   HGBA1C 5.7 12/11/2015    Assessment/Plan 1. Annual physical exam -performed today - EKG 12-Lead reviewed and wnl (to be scanned in record)  2. History of B-cell lymphoma -f/u cbc with diff annually, wnl this time around, had been released by hematology where he previously lived  3. Benign essential HTN -bp at goal with current therapy, cont same regimen - EKG 12-Lead was unchanged  4. Pure hypercholesterolemia -counseled on diet and exercise, cont zocor 40mg  daily--has had asymptomatic carotid stenosis, but no other comorbidities to warrant LDL lower than 100--he's at 88  5. Polyp of colon, unspecified part  of colon, unspecified type -last cscope was in [facility], he believes, but we don't have it on file--says it was done by someone different than who his wife saw and he does not remember who--I also cannot find the results in Dr. Sharalyn Ink records (annual wellness visits)  32. Cough -short term tussinex (was effective last year due to cough keeping him up at night - chlorpheniramine-HYDROcodone (TUSSIONEX PENNKINETIC ER) 10-8 MG/5ML SUER; Take 5 mLs by mouth every 12 (twelve) hours as needed for cough.  Dispense: 115 mL; Refill: 0  7. Hearing loss of right ear due to cerumen impaction Ear irrigation performed today   Labs/tests ordered:  Ear irrigation ekg Cbc with diff, cmp, flp, vit D  Next appt:  6 mos med mgt, come in am for fasting labs  Taneasha Fuqua L. Josiyah Tozzi, D.O. [facility] [address],  [address] Cell Phone (Mon-Fri 8am-5pm):  [phone] On Call:  [phone] & follow prompts after 5pm & weekends Office Phone:  [phone] Office Fax:  [phone]

## 2018-03-01 DIAGNOSIS — Z85820 Personal history of malignant melanoma of skin: Secondary | ICD-10-CM | POA: Diagnosis not present

## 2018-03-01 DIAGNOSIS — D485 Neoplasm of uncertain behavior of skin: Secondary | ICD-10-CM | POA: Diagnosis not present

## 2018-03-01 DIAGNOSIS — L820 Inflamed seborrheic keratosis: Secondary | ICD-10-CM | POA: Diagnosis not present

## 2018-03-01 LAB — BASIC METABOLIC PANEL
BUN: 17 (ref 4–21)
Creatinine: 0.7 (ref 0.6–1.3)
Glucose: 84
Potassium: 6 — AB (ref 3.4–5.3)
Sodium: 141 (ref 137–147)

## 2018-03-01 LAB — HEPATIC FUNCTION PANEL
ALT: 31 (ref 10–40)
AST: 28 (ref 14–40)
Alkaline Phosphatase: 65 (ref 25–125)
Bilirubin, Total: 0.5

## 2018-03-01 LAB — CBC AND DIFFERENTIAL
HCT: 46 (ref 41–53)
Hemoglobin: 15.4 (ref 13.5–17.5)
Platelets: 192 (ref 150–399)
WBC: 6.2

## 2018-03-01 LAB — LIPID PANEL
Cholesterol: 181 (ref 0–200)
HDL: 75 — AB (ref 35–70)
LDL Cholesterol: 88
Triglycerides: 93 (ref 40–160)

## 2018-03-01 LAB — VITAMIN D 25 HYDROXY (VIT D DEFICIENCY, FRACTURES): Vit D, 25-Hydroxy: 41.51

## 2018-03-07 DIAGNOSIS — H353112 Nonexudative age-related macular degeneration, right eye, intermediate dry stage: Secondary | ICD-10-CM | POA: Diagnosis not present

## 2018-03-13 ENCOUNTER — Encounter: Payer: Self-pay | Admitting: Internal Medicine

## 2018-04-02 ENCOUNTER — Other Ambulatory Visit: Payer: Self-pay | Admitting: *Deleted

## 2018-04-02 MED ORDER — LOSARTAN POTASSIUM 100 MG PO TABS: 100.0000 mg | ORAL_TABLET | Freq: Every day | ORAL | 1 refills | Status: DC

## 2018-04-02 NOTE — Telephone Encounter (Signed)
Patient requested. Faxed.  

## 2018-04-04 DIAGNOSIS — H353131 Nonexudative age-related macular degeneration, bilateral, early dry stage: Secondary | ICD-10-CM | POA: Diagnosis not present

## 2018-04-06 DIAGNOSIS — M7632 Iliotibial band syndrome, left leg: Secondary | ICD-10-CM | POA: Diagnosis not present

## 2018-04-09 ENCOUNTER — Encounter: Payer: Self-pay | Admitting: Internal Medicine

## 2018-04-09 DIAGNOSIS — M7632 Iliotibial band syndrome, left leg: Secondary | ICD-10-CM | POA: Diagnosis not present

## 2018-04-24 ENCOUNTER — Other Ambulatory Visit: Payer: Self-pay

## 2018-04-24 MED ORDER — SIMVASTATIN 40 MG PO TABS: 40.0000 mg | ORAL_TABLET | Freq: Every day | ORAL | 1 refills | Status: DC

## 2018-04-24 NOTE — Telephone Encounter (Signed)
Patient's wife called to request a refill on simvastatin 40 mg. She asked that the prescription be sent to [facility] in [place].   Rx was sent electronically

## 2018-04-25 DIAGNOSIS — M7632 Iliotibial band syndrome, left leg: Secondary | ICD-10-CM | POA: Diagnosis not present

## 2018-04-26 ENCOUNTER — Telehealth: Payer: Self-pay | Admitting: Internal Medicine

## 2018-04-26 DIAGNOSIS — Z85820 Personal history of malignant melanoma of skin: Secondary | ICD-10-CM

## 2018-04-26 DIAGNOSIS — M899 Disorder of bone, unspecified: Secondary | ICD-10-CM

## 2018-04-26 DIAGNOSIS — Z8572 Personal history of non-Hodgkin lymphomas: Secondary | ICD-10-CM

## 2018-04-26 NOTE — Telephone Encounter (Signed)
Pt walked in with his MRI disc and MRI report and a note indicating that he'd seen Antonio [name] at [name] who ordered an MRI of his left knee due to left lateral knee pain. I'd referred pt there for this.  He was found to have a lesion in his left lower leg on the MRI near where he previously had melanoma resected.  His orthopedic surgeon has delayed any intervention for his arthritis and meniscal tears until this is addressed.  I talked with Antonio [name] and he's spoken with his prior melanoma surgeon who has retired, Antonio [name].  He recommended pt see Antonio [name] or Antonio [name] at [name] to biopsy the lesion/remove the lesion. 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    Referrals were placed for the surgeons of choice and to [facility].  They were both placed as urgent as this is a new finding in need of prompt evaluation and mgt.    Antonio [name], D.O. [facility] [address], [place] [address] Cell Phone (Mon-Fri 8am-5pm):  [phone] On Call:  [phone] & follow prompts after 5pm & weekends Office Phone:  [phone] Office Fax:  [phone]

## 2018-04-30 ENCOUNTER — Telehealth: Payer: Self-pay | Admitting: Hematology

## 2018-04-30 NOTE — Telephone Encounter (Signed)
Pt has been scheduled to see Dr. Irene Limbo on 7/17 at 1pm. I've notified Veda Canning from [facility] to make sure the pt arrive 30 minutes early so that he can be checked in on time.

## 2018-05-01 NOTE — Progress Notes (Signed)
HEMATOLOGY/ONCOLOGY CONSULTATION NOTE  Date of Service: 05/02/2018  Patient Care Team: [name], DO as PCP - General (Geriatric Medicine)  CHIEF COMPLAINTS/PURPOSE OF CONSULTATION:  Bone lesion of left lower leg  HISTORY OF PRESENTING ILLNESS:   Antonio [name] is a wonderful 77 y.o. male who has been referred to [place] by Dr. Hollace Kinnier for evaluation and management of Bone lesion of left lower leg. The pt reports that he is doing well overall.   The pt reports that he cannot feel the ovoid finding from his 04/09/18 MRI as noted below. He denies being able to feel anything in his skin as well. He notes that he received a needle injection in his knee prior to this MRI. The pt notes that his knee has lost much cartilage and is considering a knee replacement. He denies any recent injuries to his knee or falls. He has contacted Dr. Alphonsa Overall in surgery and another surgeon as well and will be making an appointment.   The pt notes that he does not feel any differently recently as compared to 6 months to a year ago. He does note that he normally has constipation in the spring, and has continued to have mild constipation. He denies any other concerns or symptoms.   The pt previously saw my colleague Dr. Lurline Del for primary testicular B-cell lymphoma in 2000. The pt notes that the involvement was limited to only one testicle. He received 6 intrathecal treatments in his spine, and received 4 cycles of chemotherapy (thinks this was CHOP) and radiation as well. The pt was followed by an oncologist for several years at the [facility] in [place] after moving from [place] after his [place] treatment.   The pt notes that he was first diagnosed with melanoma in October 2000. He noticed a strange spot on his left leg that was surgically resected. The pt's personal notes report a 2.9cm thick, level 4, no LN involvement, high risk stage II, treated with Interferon  three times a week for one year. He notes some squamous cell and basal cell involvements as well and sees a dermatologist, Dr. Wilhemina Bonito, regularly.   Of note prior to the patient's visit today, pt has had MRI Left Knee completed on 04/09/18 with results revealing An ovoid, lobulated, 102mm T2 hyperintense focus within the medial subcutaneous far 1.24mm distal to the joint demonstrates demonstrates nonspecific imaging characteristics.   Most recent lab results (03/01/18) of CBC w/diff is as follows: all values are WNL except for RBC at 4.54, MCV at 100.4, MCH at 33.8.   On review of systems, pt reports left knee pain, mild constipation, and denies new or concerning symptoms, noticing any new lumps or bumps, pain along the spine, abdominal pains, problems passing urine, testicular pain/swelling, and any other symptoms.   MEDICAL HISTORY:  Past Medical History:  Diagnosis Date  . B-cell lymphoma ([place])    testicular diffuse large b-cell lymphoma  . BCC (basal cell carcinoma of skin)   . Carotid stenosis, asymptomatic, left   . Cataracts, bilateral   . Elevated diaphragm    right  . Erectile dysfunction   . Hyperlipidemia   . Hypertension   . Kidney stone   . Lymphoma of testis ([place])   . Melanoma ([place])   . Melanoma ([place])   . Posterior vitreous detachment   . SCC (squamous cell carcinoma)   . Status post joint replacement    right TKA  SURGICAL HISTORY: Past Surgical History:  Procedure Laterality Date  . CATARACT EXTRACTION Bilateral 01/2017   Dr. Merril Abbe [facility], [place])   . CATARACT EXTRACTION W/ INTRAOCULAR LENS IMPLANT Bilateral 2018  . MELANOMA EXCISION  10/1999  . TOTAL KNEE ARTHROPLASTY  06/2009    SOCIAL HISTORY: Social History   Socioeconomic History  . Marital status: Married    Spouse name: Not on file  . Number of children: Not on file  . Years of education: Not on file  . Highest education level: Not on file  Occupational History  . Not on file  Social  Needs  . Financial resource strain: Not hard at all  . Food insecurity:    Worry: Never true    Inability: Never true  . Transportation needs:    Medical: No    Non-medical: No  Tobacco Use  . Smoking status: Never Smoker  . Smokeless tobacco: Never Used  Substance and Sexual Activity  . Alcohol use: Yes    Comment: 7 to 9 drinks per week  . Drug use: No  . Sexual activity: Not Currently  Lifestyle  . Physical activity:    Days per week: 2 days    Minutes per session: 20 min  . Stress: To some extent  Relationships  . Social connections:    Talks on phone: More than three times a week    Gets together: More than three times a week    Attends religious service: More than 4 times per year    Active member of club or organization: Yes    Attends meetings of clubs or organizations: More than 4 times per year    Relationship status: Married  . Intimate partner violence:    Fear of current or ex partner: No    Emotionally abused: No    Physically abused: No    Forced sexual activity: No  Other Topics Concern  . Not on file  Social History Narrative   Social History      Diet?good      Do you drink/eat things with caffeine? seldom      Marital status?          yes                          What year were you married? 1962      Do you live in a house, apartment, assisted living, condo, trailer, etc.? house      Is it one or more stories?1      How many persons live in your home? 2      Do you have any pets in your home? (please list) no      Highest level of education completed? college      Current or past profession: president and [employer]      Do you exercise?   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 yes      Do you have a DNR form?       yes  If not, do you want to discuss one?      Do you have signed POA/HPOA for forms?  yes       Functional Status      Do you have difficulty bathing or dressing yourself? no      Do you have difficulty preparing food or eating? no      Do you have difficulty managing your medications? no      Do you have difficulty managing your finances? no      Do you have difficulty affording your medications? no    FAMILY HISTORY: Family History  Problem Relation Age of Onset  . Cataracts Mother   . Transient ischemic attack Mother   . Asthma Mother   . Cataracts Father   . Diabetes Father        borderline  . Hypertension Father   . Hyperlipidemia Father   . Hyperlipidemia Son   . Hypertension Son   . Amblyopia Neg Hx   . Blindness Neg Hx   . Glaucoma Neg Hx   . Macular degeneration Neg Hx   . Retinal detachment Neg Hx   . Strabismus Neg Hx   . Retinitis pigmentosa Neg Hx     ALLERGIES:  has No Known Allergies.  MEDICATIONS:  Current Outpatient Medications  Medication Sig Dispense Refill  . chlorpheniramine-HYDROcodone (TUSSIONEX PENNKINETIC ER) 10-8 MG/5ML SUER Take 5 mLs by mouth every 12 (twelve) hours as needed for cough. 115 mL 0  . Cholecalciferol (VITAMIN D3) 2000 units TABS Take 1 tablet by mouth daily.    . diclofenac sodium (VOLTAREN) 1 % GEL Apply 2 g topically 3 (three) times daily.    Marland Kitchen losartan (COZAAR) 100 MG tablet Take 1 tablet (100 mg total) by mouth daily. 90 tablet 1  . meloxicam (MOBIC) 7.5 MG tablet Take 1 tablet (7.5 mg total) by mouth 2 (two) times daily. 60 tablet 1  . Multiple Vitamins-Minerals (PRESERVISION AREDS 2 PO) Take 2 capsules by mouth daily.    . simvastatin (ZOCOR) 40 MG tablet Take 1 tablet (40 mg total) by mouth daily. 90 tablet 1   No current facility-administered medications for this visit.     REVIEW OF SYSTEMS:    10 Point review of Systems was done is negative except as noted above.  PHYSICAL EXAMINATION: ECOG PERFORMANCE STATUS: 0 - Asymptomatic  . Vitals:   05/02/18 1258  BP: 140/88  Pulse: 79  Resp: 18  Temp:  98.4 F (36.9 C)  SpO2: 98%   Filed Weights   05/02/18 1258  Weight: 196 lb 1.6 oz (89 kg)   .Body mass index is 26.6 kg/m.  GENERAL:alert, in no acute distress and comfortable SKIN: no acute rashes, no significant lesions EYES: conjunctiva are pink and non-injected, sclera anicteric OROPHARYNX: MMM, no exudates, no oropharyngeal erythema or ulceration NECK: supple, no JVD LYMPH:  no palpable lymphadenopathy in the cervical, axillary or inguinal regions LUNGS: clear to auscultation b/l with normal respiratory effort HEART: regular rate & rhythm ABDOMEN:  normoactive bowel sounds , non tender, not distended. Extremity: no pedal edema PSYCH: alert & oriented x 3 with fluent speech NEURO: no focal motor/sensory deficits  LABORATORY DATA:  I have reviewed the data as listed  . CBC Latest Ref Rng & Units 05/02/2018 03/01/2018 07/04/2017  WBC 4.0 - 10.3 K/uL 6.2 6.2 7.1  Hemoglobin 13.0 - 17.1 g/dL 13.7 15.4 15.2  Hematocrit 38.4 - 49.9 % 41.3 46 47  Platelets 140 - 400 K/uL  184 192 232   . CBC    Component Value Date/Time   WBC 6.2 05/02/2018 1423   RBC 4.08 (L) 05/02/2018 1423   HGB 13.7 05/02/2018 1423   HCT 41.3 05/02/2018 1423   PLT 184 05/02/2018 1423   MCV 101.3 (H) 05/02/2018 1423   MCH 33.7 (H) 05/02/2018 1423   MCHC 33.2 05/02/2018 1423   RDW 13.3 05/02/2018 1423   LYMPHSABS 1.1 05/02/2018 1423   MONOABS 0.5 05/02/2018 1423   EOSABS 0.1 05/02/2018 1423   BASOSABS 0.0 05/02/2018 1423    . CMP Latest Ref Rng & Units 05/02/2018 03/01/2018 07/04/2017  Glucose 70 - 99 mg/dL 101(H) - -  BUN 8 - 23 mg/dL 21 17 19   Creatinine 0.61 - 1.24 mg/dL 0.83 0.7 0.7  Sodium 135 - 145 mmol/L 138 141 141  Potassium 3.5 - 5.1 mmol/L 4.0 6.0(A) 4.7  Chloride 98 - 111 mmol/L 103 - -  CO2 22 - 32 mmol/L 28 - -  Calcium 8.9 - 10.3 mg/dL 9.3 - -  Total Protein 6.5 - 8.1 g/dL 7.1 - -  Total Bilirubin 0.3 - 1.2 mg/dL 0.7 - -  Alkaline Phos 38 - 126 U/L 66 65 66  AST 15 - 41 U/L 26  28 30   ALT 0 - 44 U/L 38 31 37   03/01/18 CBC w/diff:    RADIOGRAPHIC STUDIES: I have personally reviewed the radiological images as listed and agreed with the findings in the report. No results found.   04/09/18 MRI Left Knee:     ASSESSMENT & PLAN:   77 y.o. male with  1. Ovoid lesion near the medial aspect of  of left knee ? Injection granuloma vs other etiology for lesion. however given previous h/o melanoma and lymphoma will need to be caution and try to wotrk this up. PLAN -Discussed patient's most recent labs from 03/01/18, HGB normal at 15.4, PLT normal at 192k, WBC normal at 8.2k -Discussed the 04/09/18 MRI of Left Knee which revealed An ovoid, lobulated, 64mm T2 hyperintense focus within the medial subcutaneous far 1.26mm distal to the joint demonstrates demonstrates nonspecific imaging characteristics. -Given the patient's history of Lymphoma and melanoma, will workup further -Will refer pt to IR for an US guided needle bx of 14mm ovoid lesion on left knee -If US biopsy fails, will refer pt for surgical biopsy -Injection granuloma vs malignancy? -Will see the pt back in 2 weeks -Continue follow up with dermatologist Dr. Wilhemina Bonito    -US guided biopsy of subcutaneous nodule near the posterior medial aspect of left knee noted on MRI knee (in 1 week) -Labs today -RTC with Dr Irene Limbo in 2 weeks (3-4 days after US guided biopsy)  All of the patients questions were answered with apparent satisfaction. The patient knows to call the clinic with any problems, questions or concerns.  The total time spent in the appt was 45 minutes and more than 50% was on counseling and direct patient cares.    Sullivan Lone MD MS AAHIVMS [facility] Hematology/Oncology Physician [facility]  (Office):       [phone] (Work cell):  [phone] (Fax):           [phone]  05/02/2018 2:01 PM  I, Baldwin [place], am acting as a Education administrator for Dr Irene Limbo.   .I have reviewed the above  documentation for accuracy and completeness, and I agree with the above. Brunetta Genera MD

## 2018-05-02 ENCOUNTER — Inpatient Hospital Stay: Payer: PPO | Attending: Hematology | Admitting: Hematology

## 2018-05-02 ENCOUNTER — Telehealth: Payer: Self-pay | Admitting: Hematology

## 2018-05-02 ENCOUNTER — Telehealth: Payer: Self-pay | Admitting: *Deleted

## 2018-05-02 ENCOUNTER — Inpatient Hospital Stay: Payer: PPO

## 2018-05-02 VITALS — BP 140/88 | HR 79 | Temp 98.4°F | Resp 18 | Ht 72.0 in | Wt 196.1 lb

## 2018-05-02 DIAGNOSIS — Z8579 Personal history of other malignant neoplasms of lymphoid, hematopoietic and related tissues: Secondary | ICD-10-CM | POA: Diagnosis not present

## 2018-05-02 DIAGNOSIS — I10 Essential (primary) hypertension: Secondary | ICD-10-CM | POA: Diagnosis not present

## 2018-05-02 DIAGNOSIS — Z79899 Other long term (current) drug therapy: Secondary | ICD-10-CM | POA: Diagnosis not present

## 2018-05-02 DIAGNOSIS — L989 Disorder of the skin and subcutaneous tissue, unspecified: Secondary | ICD-10-CM

## 2018-05-02 DIAGNOSIS — Z85820 Personal history of malignant melanoma of skin: Secondary | ICD-10-CM | POA: Diagnosis not present

## 2018-05-02 DIAGNOSIS — I6529 Occlusion and stenosis of unspecified carotid artery: Secondary | ICD-10-CM | POA: Diagnosis not present

## 2018-05-02 LAB — CBC WITH DIFFERENTIAL/PLATELET
Basophils Absolute: 0 10*3/uL (ref 0.0–0.1)
Basophils Relative: 1 %
Eosinophils Absolute: 0.1 10*3/uL (ref 0.0–0.5)
Eosinophils Relative: 1 %
HCT: 41.3 % (ref 38.4–49.9)
Hemoglobin: 13.7 g/dL (ref 13.0–17.1)
Lymphocytes Relative: 18 %
Lymphs Abs: 1.1 10*3/uL (ref 0.9–3.3)
MCH: 33.7 pg — ABNORMAL HIGH (ref 27.2–33.4)
MCHC: 33.2 g/dL (ref 32.0–36.0)
MCV: 101.3 fL — ABNORMAL HIGH (ref 79.3–98.0)
Monocytes Absolute: 0.5 10*3/uL (ref 0.1–0.9)
Monocytes Relative: 9 %
Neutro Abs: 4.4 10*3/uL (ref 1.5–6.5)
Neutrophils Relative %: 71 %
Platelets: 184 10*3/uL (ref 140–400)
RBC: 4.08 MIL/uL — ABNORMAL LOW (ref 4.20–5.82)
RDW: 13.3 % (ref 11.0–14.6)
WBC: 6.2 10*3/uL (ref 4.0–10.3)

## 2018-05-02 LAB — CMP (CANCER CENTER ONLY)
ALT: 38 U/L (ref 0–44)
AST: 26 U/L (ref 15–41)
Albumin: 4.4 g/dL (ref 3.5–5.0)
Alkaline Phosphatase: 66 U/L (ref 38–126)
Anion gap: 7 (ref 5–15)
BUN: 21 mg/dL (ref 8–23)
CO2: 28 mmol/L (ref 22–32)
Calcium: 9.3 mg/dL (ref 8.9–10.3)
Chloride: 103 mmol/L (ref 98–111)
Creatinine: 0.83 mg/dL (ref 0.61–1.24)
GFR, Est AFR Am: >60 mL/min (ref >60)
GFR, Estimated: >60 mL/min (ref >60)
Glucose, Bld: 101 mg/dL — ABNORMAL HIGH (ref 70–99)
Potassium: 4 mmol/L (ref 3.5–5.1)
Sodium: 138 mmol/L (ref 135–145)
Total Bilirubin: 0.7 mg/dL (ref 0.3–1.2)
Total Protein: 7.1 g/dL (ref 6.5–8.1)

## 2018-05-02 LAB — PROTIME-INR
INR: 1.04
Prothrombin Time: 13.5 seconds (ref 11.4–15.2)

## 2018-05-02 LAB — LACTATE DEHYDROGENASE: LDH: 152 U/L (ref 98–192)

## 2018-05-02 LAB — APTT: aPTT: 26 seconds (ref 24–36)

## 2018-05-02 NOTE — Telephone Encounter (Signed)
I bought the disc to the [facility] at [facility] and he picked it up 05/01/18 at [facility] front desk. They need to check with patient.

## 2018-05-02 NOTE — Telephone Encounter (Signed)
Antonio [name] with Radiology notified and will contact patient.

## 2018-05-02 NOTE — Telephone Encounter (Signed)
Gave patient avs and calendar of upcoming aug appts.  °

## 2018-05-02 NOTE — Telephone Encounter (Signed)
Antonio [name] with [facility] Radiology called and stated that patient was seen today by [facility] and the Dr. Vonzella Nipple a Biopsy for that area, Left Leg. Stated that she was reviewing your note and you stated that the patient brought in a MRI disk. They are needing this disk before they can schedule the biopsy. Please Advise.

## 2018-05-02 NOTE — Patient Instructions (Signed)
We will set you up for an UltraSound guided biopsy, with Interventional Radiology, of the soft tissue nodule at the left knee.  If the Radiologist cannot get the biopsy, we will gather a surgical biopsy with a surgeon. We will see you back after the biopsy results come back.

## 2018-05-03 ENCOUNTER — Ambulatory Visit
Admission: RE | Admit: 2018-05-03 | Discharge: 2018-05-03 | Disposition: A | Discharge status: Home / Self Care | Payer: Self-pay | Source: Ambulatory Visit | Attending: Hematology | Admitting: Hematology

## 2018-05-03 ENCOUNTER — Other Ambulatory Visit: Payer: Self-pay | Admitting: Hematology

## 2018-05-03 DIAGNOSIS — C801 Malignant (primary) neoplasm, unspecified: Secondary | ICD-10-CM

## 2018-05-03 LAB — MULTIPLE MYELOMA PANEL, SERUM
Albumin SerPl Elph-Mcnc: 3.7 g/dL (ref 2.9–4.4)
Albumin/Glob SerPl: 1.3 (ref 0.7–1.7)
Alpha 1: 0.2 g/dL (ref 0.0–0.4)
Alpha2 Glob SerPl Elph-Mcnc: 0.6 g/dL (ref 0.4–1.0)
B-Globulin SerPl Elph-Mcnc: 0.8 g/dL (ref 0.7–1.3)
Gamma Glob SerPl Elph-Mcnc: 1.2 g/dL (ref 0.4–1.8)
Globulin, Total: 2.9 g/dL (ref 2.2–3.9)
IgA: 106 mg/dL (ref 61–437)
IgG (Immunoglobin G), Serum: 673 mg/dL — ABNORMAL LOW (ref 700–1600)
IgM (Immunoglobulin M), Srm: 656 mg/dL — ABNORMAL HIGH (ref 15–143)
M Protein SerPl Elph-Mcnc: 0.5 g/dL — ABNORMAL HIGH
Total Protein ELP: 6.6 g/dL (ref 6.0–8.5)

## 2018-05-09 ENCOUNTER — Encounter (INDEPENDENT_AMBULATORY_CARE_PROVIDER_SITE_OTHER): Payer: PPO | Admitting: Ophthalmology

## 2018-05-11 ENCOUNTER — Other Ambulatory Visit: Payer: Self-pay | Admitting: Hematology

## 2018-05-11 DIAGNOSIS — R2240 Localized swelling, mass and lump, unspecified lower limb: Principal | ICD-10-CM

## 2018-05-11 DIAGNOSIS — M7989 Other specified soft tissue disorders: Secondary | ICD-10-CM

## 2018-05-14 ENCOUNTER — Ambulatory Visit (HOSPITAL_COMMUNITY)
Admission: RE | Admit: 2018-05-14 | Discharge: 2018-05-14 | Disposition: A | Discharge status: Home / Self Care | Payer: PPO | Source: Ambulatory Visit | Attending: Hematology | Admitting: Hematology

## 2018-05-14 DIAGNOSIS — R2242 Localized swelling, mass and lump, left lower limb: Secondary | ICD-10-CM | POA: Diagnosis not present

## 2018-05-14 DIAGNOSIS — R2240 Localized swelling, mass and lump, unspecified lower limb: Secondary | ICD-10-CM | POA: Diagnosis present

## 2018-05-14 DIAGNOSIS — M7989 Other specified soft tissue disorders: Secondary | ICD-10-CM

## 2018-05-15 ENCOUNTER — Other Ambulatory Visit: Payer: Self-pay | Admitting: Hematology

## 2018-05-16 ENCOUNTER — Other Ambulatory Visit: Payer: Self-pay | Admitting: Student

## 2018-05-17 ENCOUNTER — Other Ambulatory Visit: Payer: Self-pay | Admitting: Hematology

## 2018-05-17 ENCOUNTER — Inpatient Hospital Stay: Payer: PPO | Admitting: Hematology

## 2018-05-17 ENCOUNTER — Other Ambulatory Visit: Payer: Self-pay

## 2018-05-17 ENCOUNTER — Ambulatory Visit (HOSPITAL_COMMUNITY)
Admission: RE | Admit: 2018-05-17 | Discharge: 2018-05-17 | Disposition: A | Discharge status: Home / Self Care | Payer: PPO | Source: Ambulatory Visit | Attending: Hematology | Admitting: Hematology

## 2018-05-17 ENCOUNTER — Encounter (HOSPITAL_COMMUNITY): Payer: Self-pay

## 2018-05-17 DIAGNOSIS — Z85820 Personal history of malignant melanoma of skin: Secondary | ICD-10-CM

## 2018-05-17 DIAGNOSIS — Z8572 Personal history of non-Hodgkin lymphomas: Secondary | ICD-10-CM | POA: Insufficient documentation

## 2018-05-17 DIAGNOSIS — Z96651 Presence of right artificial knee joint: Secondary | ICD-10-CM | POA: Insufficient documentation

## 2018-05-17 DIAGNOSIS — Z8579 Personal history of other malignant neoplasms of lymphoid, hematopoietic and related tissues: Secondary | ICD-10-CM

## 2018-05-17 DIAGNOSIS — M7989 Other specified soft tissue disorders: Secondary | ICD-10-CM | POA: Diagnosis not present

## 2018-05-17 DIAGNOSIS — R2242 Localized swelling, mass and lump, left lower limb: Secondary | ICD-10-CM | POA: Diagnosis not present

## 2018-05-17 DIAGNOSIS — E785 Hyperlipidemia, unspecified: Secondary | ICD-10-CM | POA: Diagnosis not present

## 2018-05-17 DIAGNOSIS — N529 Male erectile dysfunction, unspecified: Secondary | ICD-10-CM | POA: Insufficient documentation

## 2018-05-17 DIAGNOSIS — Z79899 Other long term (current) drug therapy: Secondary | ICD-10-CM | POA: Insufficient documentation

## 2018-05-17 DIAGNOSIS — L989 Disorder of the skin and subcutaneous tissue, unspecified: Secondary | ICD-10-CM

## 2018-05-17 DIAGNOSIS — I10 Essential (primary) hypertension: Secondary | ICD-10-CM | POA: Insufficient documentation

## 2018-05-17 DIAGNOSIS — Z87442 Personal history of urinary calculi: Secondary | ICD-10-CM | POA: Insufficient documentation

## 2018-05-17 LAB — PROTIME-INR
INR: 0.96
Prothrombin Time: 12.7 seconds (ref 11.4–15.2)

## 2018-05-17 LAB — CBC
HCT: 44.7 % (ref 39.0–52.0)
Hemoglobin: 14.7 g/dL (ref 13.0–17.0)
MCH: 33.5 pg (ref 26.0–34.0)
MCHC: 32.9 g/dL (ref 30.0–36.0)
MCV: 101.8 fL — ABNORMAL HIGH (ref 78.0–100.0)
Platelets: 219 10*3/uL (ref 150–400)
RBC: 4.39 MIL/uL (ref 4.22–5.81)
RDW: 13.2 % (ref 11.5–15.5)
WBC: 7 10*3/uL (ref 4.0–10.5)

## 2018-05-17 LAB — APTT: aPTT: 22 seconds — ABNORMAL LOW (ref 24–36)

## 2018-05-17 MED ORDER — LIDOCAINE HCL 1 % IJ SOLN
INTRAMUSCULAR | Status: AC
  Filled 2018-05-17: qty 20

## 2018-05-17 MED ORDER — SODIUM CHLORIDE 0.9 % IV SOLN
INTRAVENOUS | Status: DC
  Administered 2018-05-17: 11:00:00 via INTRAVENOUS

## 2018-05-17 NOTE — Consent Form (Signed)
CLINICAL DATA:    Ultrasound was provided for use by the ordering physician, and a technical  charge was applied by the performing facility.  No radiologist  interpretation/professional services rendered.   

## 2018-05-17 NOTE — Discharge Instructions (Signed)
Needle Biopsy, Care After These instructions give you information about caring for yourself after your procedure. Your doctor may also give you more specific instructions. Call your doctor if you have any problems or questions after your procedure. Follow these instructions at home:  Rest as told by your doctor.  Take medicines only as told by your doctor.  There are many different ways to close and cover the biopsy site, including stitches (sutures), skin glue, and adhesive strips. Follow instructions from your doctor about: ? How to take care of your biopsy site. ? When and how you should change your bandage (dressing).  You may remove your dressing tomorrow.  You may shower tomorrow. ? When you should remove your dressing. ? Removing whatever was used to close your biopsy site.  Check your biopsy site every day for signs of infection. Watch for: ? Redness, swelling, or pain. ? Fluid, blood, or pus. Contact a doctor if:  You have a fever.  You have redness, swelling, or pain at the biopsy site, and it lasts longer than a few days.  You have fluid, blood, or pus coming from the biopsy site.  You feel sick to your stomach (nauseous).  You throw up (vomit). Get help right away if:  You are short of breath.  You have trouble breathing.  Your chest hurts.  You feel dizzy or you pass out (faint).  You have bleeding that does not stop with pressure or a bandage.  You cough up blood.  Your belly (abdomen) hurts. This information is not intended to replace advice given to you by your health care provider. Make sure you discuss any questions you have with your health care provider. Document Released: 09/15/2008 Document Revised: 03/10/2016 Document Reviewed: 09/29/2014 Elsevier Interactive Patient Education  [employer].

## 2018-05-17 NOTE — Procedures (Signed)
Interventional Radiology Procedure Note  Procedure: US guided FNA of left knee skin/subcutaneous lesion, in patient with melanoma hx.    Findings: Small anechoic lesion of the medial left knee.  The first FNA confirmed mostly cystic material within, with immediate decompression/decreased size, and clear fluid within the syringe.   Mx 25g and 18g FNA No core attempted.  Complications: None Recommendations:  - Ok to shower tomorrow - Do not submerge for 7 days - Routine care   Signed,  Dulcy Fanny. Earleen Newport, DO

## 2018-05-17 NOTE — H&P (Signed)
Chief Complaint: Patient was seen in consultation today for left knee subcutaneous nodule.  Referring Physician(s): Brunetta Genera  Supervising Physician: Corrie Mckusick  Patient Status: [facility] - Out-pt  History of Present Illness: Antonio [name] is a 77 y.o. male with a past medical history of hypertension, carotid stenosis, hyperlipidemia, cataracts, B-cell lymphoma, BCC of skin, SCC of skin, melanoma, kidney stones, and ED. He underwent an MRI of his lower extremity for potential knee replacement when he had an incidental finding of a nodule.  IR requested by Dr. Irene Limbo for possible image-guided left knee subcutaneous nodule biopsy. Patient awake and alert laying in bed reading with no complaints at this time. Denies fever, chills, chest pain, dyspnea, abdominal pain, dizziness, or headache.   Past Medical History:  Diagnosis Date  . B-cell lymphoma ([place])    testicular diffuse large b-cell lymphoma  . BCC (basal cell carcinoma of skin)   . Carotid stenosis, asymptomatic, left   . Cataracts, bilateral   . Elevated diaphragm    right  . Erectile dysfunction   . Hyperlipidemia   . Hypertension   . Kidney stone   . Lymphoma of testis ([place])   . Melanoma ([place])   . Melanoma ([place])   . Posterior vitreous detachment   . SCC (squamous cell carcinoma)   . Status post joint replacement    right TKA    Past Surgical History:  Procedure Laterality Date  . CATARACT EXTRACTION Bilateral 01/2017   Dr. Merril Abbe [facility] [place])   . CATARACT EXTRACTION W/ INTRAOCULAR LENS IMPLANT Bilateral 2018  . MELANOMA EXCISION  10/1999  . TOTAL KNEE ARTHROPLASTY  06/2009    Allergies: Patient has no known allergies.  Medications: Prior to Admission medications   Medication Sig Start Date End Date Taking? Authorizing Provider  Cholecalciferol (VITAMIN D3) 2000 units TABS Take 1 tablet by mouth daily.   Yes [provider]  losartan (COZAAR) 100 MG tablet Take 1 tablet (100 mg  total) by mouth daily. 04/02/18  Yes Reed, Tiffany L, DO  meloxicam (MOBIC) 7.5 MG tablet Take 1 tablet (7.5 mg total) by mouth 2 (two) times daily. 02/28/18  Yes Reed, Tiffany L, DO  Multiple Vitamins-Minerals (PRESERVISION AREDS 2 PO) Take 2 capsules by mouth daily.   Yes [provider]  simvastatin (ZOCOR) 40 MG tablet Take 1 tablet (40 mg total) by mouth daily. 04/24/18  Yes Reed, Tiffany L, DO  chlorpheniramine-HYDROcodone (TUSSIONEX PENNKINETIC ER) 10-8 MG/5ML SUER Take 5 mLs by mouth every 12 (twelve) hours as needed for cough. 02/28/18   Reed, Tiffany L, DO  diclofenac sodium (VOLTAREN) 1 % GEL Apply 2 g topically 3 (three) times daily.    [provider]     Family History  Problem Relation Age of Onset  . Cataracts Mother   . Transient ischemic attack Mother   . Asthma Mother   . Cataracts Father   . Diabetes Father        borderline  . Hypertension Father   . Hyperlipidemia Father   . Hyperlipidemia Son   . Hypertension Son   . Amblyopia Neg Hx   . Blindness Neg Hx   . Glaucoma Neg Hx   . Macular degeneration Neg Hx   . Retinal detachment Neg Hx   . Strabismus Neg Hx   . Retinitis pigmentosa Neg Hx     Social History   Socioeconomic History  . Marital status: Married    Spouse name: Not on file  .  Number of children: Not on file  . Years of education: Not on file  . Highest education level: Not on file  Occupational History  . Not on file  Social Needs  . Financial resource strain: Not hard at all  . Food insecurity:    Worry: Never true    Inability: Never true  . Transportation needs:    Medical: No    Non-medical: No  Tobacco Use  . Smoking status: Never Smoker  . Smokeless tobacco: Never Used  Substance and Sexual Activity  . Alcohol use: Yes    Comment: 7 to 9 drinks per week  . Drug use: No  . Sexual activity: Not Currently  Lifestyle  . Physical activity:    Days per week: 2 days    Minutes per session: 20 min  . Stress: To  some extent  Relationships  . Social connections:    Talks on phone: More than three times a week    Gets together: More than three times a week    Attends religious service: More than 4 times per year    Active member of club or organization: Yes    Attends meetings of clubs or organizations: More than 4 times per year    Relationship status: Married  Other Topics Concern  . Not on file  Social History Narrative   Social History      Diet?good      Do you drink/eat things with caffeine? seldom      Marital status?          yes                          What year were you married? 1962      Do you live in a house, apartment, assisted living, condo, trailer, etc.? house      Is it one or more stories?1      How many persons live in your home? 2      Do you have any pets in your home? (please list) no      Highest level of education completed? college      Current or past profession: president and [employer]      Do you exercise?   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 yes      Do you have a DNR form?       yes                            If not, do you want to discuss one?      Do you have signed POA/HPOA for forms?  yes      Functional Status      Do you have difficulty bathing or dressing yourself? no      Do you have difficulty preparing food or eating? no      Do you have difficulty managing your medications? no      Do you have difficulty managing your finances? no      Do you have difficulty affording your medications? no  Review of Systems: A 12 point ROS discussed and pertinent positives are indicated in the HPI above.  All other systems are negative.  Review of Systems  Constitutional: Negative for chills and fever.  Respiratory: Negative for shortness of breath and wheezing.   Cardiovascular: Negative for chest pain and palpitations.    Gastrointestinal: Negative for abdominal pain.  Neurological: Negative for dizziness and headaches.  Psychiatric/Behavioral: Negative for behavioral problems and confusion.    Vital Signs: There were no vitals taken for this visit.  Physical Exam  Constitutional: He is oriented to person, place, and time. He appears well-developed and well-nourished. No distress.  Cardiovascular: Normal rate, regular rhythm and normal heart sounds.  No murmur heard. Pulmonary/Chest: Effort normal and breath sounds normal. No respiratory distress. He has no wheezes.  Neurological: He is alert and oriented to person, place, and time.  Skin: Skin is warm and dry.  Psychiatric: He has a normal mood and affect. His behavior is normal. Judgment and thought content normal.  Nursing note and vitals reviewed.    MD Evaluation Airway: WNL Heart: WNL Abdomen: WNL Chest/ Lungs: WNL ASA  Classification: 3 Mallampati/Airway Score: One   Imaging: US Soft Tissue Lower Extremity Limited Right (non-vascular)  Result Date: 05/15/2018 CLINICAL DATA:  History of melanoma. Soft tissue nodule. Nodule is hyperintense on left knee MRI. EXAM: ULTRASOUND LEFT LOWER EXTREMITY LIMITED TECHNIQUE: Ultrasound examination of the lower extremity soft tissues was performed in the area of clinical concern. COMPARISON:  Left knee MRI 04/09/2018. FINDINGS: A 0.7 x 1.0 x 0.4 cm septated hypoechoic nodule is noted. Some internal color flow is noted. This nodule could be benign or malignant. If further evaluation dedicated soft tissue MRI with gadolinium enhancement can be obtained. IMPRESSION: 0.7 x 1.0 x 0.4 cm septated hypoechoic nodule with some internal color flow is noted. This nodule could be benign or malignant. If further radiologic evaluation is needed dedicated soft tissue MRI with gadolinium enhancement can be obtained. Electronically Signed   By: Marcello Moores  Register   On: 05/15/2018 06:47    Labs:  CBC: Recent Labs     07/04/17 03/01/18 0600 05/02/18 1423 05/17/18 1109  WBC 7.1 6.2 6.2 7.0  HGB 15.2 15.4 13.7 14.7  HCT 47 46 41.3 44.7  PLT 232 192 184 219    COAGS: Recent Labs    05/02/18 1423  INR 1.04  APTT 26    BMP: Recent Labs    07/04/17 03/01/18 0600 05/02/18 1423  NA 141 141 138  K 4.7 6.0* 4.0  CL  --   --  103  CO2  --   --  28  GLUCOSE  --   --  101*  BUN 19 17 21   CALCIUM  --   --  9.3  CREATININE 0.7 0.7 0.83  GFRNONAA  --   --  >60  GFRAA  --   --  >60    LIVER FUNCTION TESTS: Recent Labs    07/04/17 03/01/18 0600 05/02/18 1423  BILITOT  --   --  0.7  AST 30 28 26   ALT 37 31 38  ALKPHOS 66 65 66  PROT  --   --  7.1  ALBUMIN  --   --  4.4    TUMOR MARKERS: No results for input(s): AFPTM, CEA, CA199, CHROMGRNA in the last 8760 hours.  Assessment and Plan:  Left knee subcutaneous nodule. Plan for image-guided left knee subcutaneous nodule biopsy today with Dr. Earleen Newport. Patient is NPO-  last ate at 0630 this AM. Denies fever and WBCs WNL. He does not take blood thinners. INR pending.  Risks and benefits discussed with the patient including, but not limited to bleeding, infection, damage to adjacent structures or low yield requiring additional tests. All of the patient's questions were answered, patient is agreeable to proceed. Consent signed and in chart.   Thank you for this interesting consult.  I greatly enjoyed meeting Antonio [name] and look forward to participating in their care.  A copy of this report was sent to the requesting provider on this date.  Electronically Signed: Earley Abide, PA-C 05/17/2018, 11:29 AM   I spent a total of 15 Minutes in face to face in clinical consultation, greater than 50% of which was counseling/coordinating care for left knee subcutaneous nodule.

## 2018-05-18 ENCOUNTER — Telehealth: Payer: Self-pay

## 2018-05-18 NOTE — Telephone Encounter (Signed)
Lft a voice message concerning patient upcoming appointment. Per 7/30 sch msg

## 2018-05-23 ENCOUNTER — Telehealth: Payer: Self-pay

## 2018-05-23 ENCOUNTER — Telehealth: Payer: Self-pay | Admitting: *Deleted

## 2018-05-23 NOTE — Telephone Encounter (Signed)
Patient notified and agreed and stated that Dr. Irene Limbo is to call him today.

## 2018-05-23 NOTE — Telephone Encounter (Signed)
Patient called and left message on Clinical intake requesting the results from his Biopsy. Patient stated that he is not getting anywhere with Dr. Lucia Gaskins due to an "unfair situation."  Patient stated that he just wants to know if it is Cancerous or not and if he is going to need to see a surgeon. Please Advise.

## 2018-05-23 NOTE — Telephone Encounter (Signed)
I see that he also called Dr. Irene Limbo.  It appears that there are lab results from July that he has not been told about from heme/onc also.  The pathology report I see in his record says there is inflammation but no cancer cells were seen.  I will allow Dr. Irene Limbo to review the other labs with Antonio [name] when he calls him.

## 2018-05-23 NOTE — Telephone Encounter (Signed)
Patient called requesting results from recent US/soft tissue biopsy. Dr. Irene Limbo made aware and stated he will call the patient to discuss. Called the patient and made him aware that Dr. Irene Limbo will call him to discuss the results and plan.

## 2018-05-25 ENCOUNTER — Telehealth: Payer: Self-pay

## 2018-05-25 NOTE — Telephone Encounter (Signed)
Per Dr. Irene Limbo placed call to patient to let him know that the pathology report showed no malignancy and patient should repeat MRI with his PCP. Patient verbalized understanding.

## 2018-05-29 DIAGNOSIS — D1801 Hemangioma of skin and subcutaneous tissue: Secondary | ICD-10-CM | POA: Diagnosis not present

## 2018-05-29 DIAGNOSIS — L820 Inflamed seborrheic keratosis: Secondary | ICD-10-CM | POA: Diagnosis not present

## 2018-05-29 DIAGNOSIS — D485 Neoplasm of uncertain behavior of skin: Secondary | ICD-10-CM | POA: Diagnosis not present

## 2018-05-29 DIAGNOSIS — L570 Actinic keratosis: Secondary | ICD-10-CM | POA: Diagnosis not present

## 2018-05-29 DIAGNOSIS — Z85828 Personal history of other malignant neoplasm of skin: Secondary | ICD-10-CM | POA: Diagnosis not present

## 2018-05-29 DIAGNOSIS — D045 Carcinoma in situ of skin of trunk: Secondary | ICD-10-CM | POA: Diagnosis not present

## 2018-05-29 DIAGNOSIS — Z85820 Personal history of malignant melanoma of skin: Secondary | ICD-10-CM | POA: Diagnosis not present

## 2018-06-07 NOTE — Progress Notes (Signed)
HEMATOLOGY/ONCOLOGY CLINIC NOTE  Date of Service: 06/08/2018  Patient Care Team: [name], DO as PCP - General (Geriatric Medicine)  CHIEF COMPLAINTS/PURPOSE OF CONSULTATION:  Soft tissue lesion of left lower leg  HISTORY OF PRESENTING ILLNESS:   Antonio [name] is a wonderful 77 y.o. male who has been referred to [place] by Dr. Hollace Kinnier for evaluation and management of Bone lesion of left lower leg. The pt reports that he is doing well overall.   The pt reports that he cannot feel the ovoid finding from his 04/09/18 MRI as noted below. He denies being able to feel anything in his skin as well. He notes that he received a needle injection in his knee prior to this MRI. The pt notes that his knee has lost much cartilage and is considering a knee replacement. He denies any recent injuries to his knee or falls. He has contacted Dr. Alphonsa Overall in surgery and another surgeon as well and will be making an appointment.   The pt notes that he does not feel any differently recently as compared to 6 months to a year ago. He does note that he normally has constipation in the spring, and has continued to have mild constipation. He denies any other concerns or symptoms.   The pt previously saw my colleague Dr. Lurline Del for primary testicular B-cell lymphoma in 2000. The pt notes that the involvement was limited to only one testicle. He received 6 intrathecal treatments in his spine, and received 4 cycles of chemotherapy (thinks this was CHOP) and radiation as well. The pt was followed by an oncologist for several years at the [facility] in [place] after moving from [place] after his [place] treatment.   The pt notes that he was first diagnosed with melanoma in October 2000. He noticed a strange spot on his left leg that was surgically resected. The pt's personal notes report a 2.9cm thick, level 4, no LN involvement, high risk stage II, treated with  Interferon three times a week for one year. He notes some squamous cell and basal cell involvements as well and sees a dermatologist, Dr. Wilhemina Bonito, regularly.   Of note prior to the patient's visit today, pt has had MRI Left Knee completed on 04/09/18 with results revealing An ovoid, lobulated, 74mm T2 hyperintense focus within the medial subcutaneous far 1.83mm distal to the joint demonstrates demonstrates nonspecific imaging characteristics.   Most recent lab results (03/01/18) of CBC w/diff is as follows: all values are WNL except for RBC at 4.54, MCV at 100.4, MCH at 33.8.   On review of systems, pt reports left knee pain, mild constipation, and denies new or concerning symptoms, noticing any new lumps or bumps, pain along the spine, abdominal pains, problems passing urine, testicular pain/swelling, and any other symptoms.   Interval History:   Antonio [name] returns today for management and evaluation of his  lesion of left lower leg. The patient's last visit with [place] was on 05/02/18. He is accompanied today by his wife. The pt reports that he is doing well overall.   The pt reports that he has not developed any constitutional symptoms in the interim. In conversation about his higher MCV, the pt notes that he drinks a few drinks every night. The pt notes that he has not felt any differently in the interim as compared to 6 months to a year ago. The pt does note that his balance has steadily worsened  over time and he occasionally wears a brace on his left knee when being more active.   Of note since the patient's last visit, pt has had US guided Soft Tissue Biopsy of the Left Knee completed on 05/17/18 which did not identify any malignant cells and revealed inflammatory cells.   Lab results (05/17/18) of CBC is as follows: all values are WNL except for MCV at 101.8. 05/02/18 CMP revealed all values WNL except for Glucose at 101. 05/02/18 MMP revealed all values WNL except for IgG at 673, IgM at 656, and M  Protein at 0.5. LDH 05/02/18 was WNL at 152  On review of systems, pt reports stable energy levels, mild constipation, and denies neuropathy, fevers, chills, night sweats, new fatigue, unexpected weight loss, new bone pains, pain along the spine, abdominal pains, and any other symptoms.    MEDICAL HISTORY:  Past Medical History:  Diagnosis Date  . B-cell lymphoma ([place])    testicular diffuse large b-cell lymphoma  . BCC (basal cell carcinoma of skin)   . Carotid stenosis, asymptomatic, left   . Cataracts, bilateral   . Elevated diaphragm    right  . Erectile dysfunction   . Hyperlipidemia   . Hypertension   . Kidney stone   . Lymphoma of testis ([place])   . Melanoma ([place])   . Melanoma ([place])   . Posterior vitreous detachment   . SCC (squamous cell carcinoma)   . Status post joint replacement    right TKA    SURGICAL HISTORY: Past Surgical History:  Procedure Laterality Date  . CATARACT EXTRACTION Bilateral 01/2017   Dr. Merril Abbe [facility] [place] [place]   . CATARACT EXTRACTION W/ INTRAOCULAR LENS IMPLANT Bilateral 2018  . MELANOMA EXCISION  10/1999  . TOTAL KNEE ARTHROPLASTY  06/2009    SOCIAL HISTORY: Social History   Socioeconomic History  . Marital status: Married    Spouse name: Not on file  . Number of children: Not on file  . Years of education: Not on file  . Highest education level: Not on file  Occupational History  . Not on file  Social Needs  . Financial resource strain: Not hard at all  . Food insecurity:    Worry: Never true    Inability: Never true  . Transportation needs:    Medical: No    Non-medical: No  Tobacco Use  . Smoking status: Never Smoker  . Smokeless tobacco: Never Used  Substance and Sexual Activity  . Alcohol use: Yes    Comment: 7 to 9 drinks per week  . Drug use: No  . Sexual activity: Not Currently  Lifestyle  . Physical activity:    Days per week: 2 days    Minutes per session: 20 min  . Stress: To some extent  Relationships    . Social connections:    Talks on phone: More than three times a week    Gets together: More than three times a week    Attends religious service: More than 4 times per year    Active member of club or organization: Yes    Attends meetings of clubs or organizations: More than 4 times per year    Relationship status: Married  . Intimate partner violence:    Fear of current or ex partner: No    Emotionally abused: No    Physically abused: No    Forced sexual activity: No  Other Topics Concern  . Not on file  Social History Narrative  Social History      Diet?good      Do you drink/eat things with caffeine? seldom      Marital status?          yes                          What year were you married? 1962      Do you live in a house, apartment, assisted living, condo, trailer, etc.? house      Is it one or more stories?1      How many persons live in your home? 2      Do you have any pets in your home? (please list) no      Highest level of education completed? college      Current or past profession: president and [employer]      Do you exercise?   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 yes      Do you have a DNR form?       yes                            If not, do you want to discuss one?      Do you have signed POA/HPOA for forms?  yes      Functional Status      Do you have difficulty bathing or dressing yourself? no      Do you have difficulty preparing food or eating? no      Do you have difficulty managing your medications? no      Do you have difficulty managing your finances? no      Do you have difficulty affording your medications? no    FAMILY HISTORY: Family History  Problem Relation Age of Onset  . Cataracts Mother   . Transient ischemic attack Mother   . Asthma Mother   . Cataracts Father   . Diabetes Father        borderline  .  Hypertension Father   . Hyperlipidemia Father   . Hyperlipidemia Son   . Hypertension Son   . Amblyopia Neg Hx   . Blindness Neg Hx   . Glaucoma Neg Hx   . Macular degeneration Neg Hx   . Retinal detachment Neg Hx   . Strabismus Neg Hx   . Retinitis pigmentosa Neg Hx     ALLERGIES:  has No Known Allergies.  MEDICATIONS:  Current Outpatient Medications  Medication Sig Dispense Refill  . chlorpheniramine-HYDROcodone (TUSSIONEX PENNKINETIC ER) 10-8 MG/5ML SUER Take 5 mLs by mouth every 12 (twelve) hours as needed for cough. 115 mL 0  . Cholecalciferol (VITAMIN D3) 2000 units TABS Take 1 tablet by mouth daily.    . diclofenac sodium (VOLTAREN) 1 % GEL Apply 2 g topically 3 (three) times daily.    Marland Kitchen losartan (COZAAR) 100 MG tablet Take 1 tablet (100 mg total) by mouth daily. 90 tablet 1  . meloxicam (MOBIC) 7.5 MG tablet Take 1 tablet (7.5 mg total) by mouth 2 (two) times daily. 60 tablet 1  . Multiple  Vitamins-Minerals (PRESERVISION AREDS 2 PO) Take 2 capsules by mouth daily.    . simvastatin (ZOCOR) 40 MG tablet Take 1 tablet (40 mg total) by mouth daily. 90 tablet 1   No current facility-administered medications for this visit.     REVIEW OF SYSTEMS:    A 10+ POINT REVIEW OF SYSTEMS WAS OBTAINED including neurology, dermatology, psychiatry, cardiac, respiratory, lymph, extremities, GI, GU, Musculoskeletal, constitutional, breasts, reproductive, HEENT.  All pertinent positives are noted in the HPI.  All others are negative.   PHYSICAL EXAMINATION: ECOG PERFORMANCE STATUS: 0 - Asymptomatic VS reviewed - stable GENERAL:alert, in no acute distress and comfortable SKIN: no acute rashes, no significant lesions EYES: conjunctiva are pink and non-injected, sclera anicteric OROPHARYNX: MMM, no exudates, no oropharyngeal erythema or ulceration NECK: supple, no JVD LYMPH:  no palpable lymphadenopathy in the cervical, axillary or inguinal regions LUNGS: clear to auscultation b/l with  normal respiratory effort HEART: regular rate & rhythm ABDOMEN:  normoactive bowel sounds , non tender, not distended. No palpable hepatosplenomegaly.  Extremity: no pedal edema PSYCH: alert & oriented x 3 with fluent speech NEURO: no focal motor/sensory deficits   LABORATORY DATA:  I have reviewed the data as listed  . CBC Latest Ref Rng & Units 05/17/2018 05/02/2018 03/01/2018  WBC 4.0 - 10.5 K/uL 7.0 6.2 6.2  Hemoglobin 13.0 - 17.0 g/dL 14.7 13.7 15.4  Hematocrit 39.0 - 52.0 % 44.7 41.3 46  Platelets 150 - 400 K/uL 219 184 192   . CBC    Component Value Date/Time   WBC 7.0 05/17/2018 1109   RBC 4.39 05/17/2018 1109   HGB 14.7 05/17/2018 1109   HCT 44.7 05/17/2018 1109   PLT 219 05/17/2018 1109   MCV 101.8 (H) 05/17/2018 1109   MCH 33.5 05/17/2018 1109   MCHC 32.9 05/17/2018 1109   RDW 13.2 05/17/2018 1109   LYMPHSABS 1.1 05/02/2018 1423   MONOABS 0.5 05/02/2018 1423   EOSABS 0.1 05/02/2018 1423   BASOSABS 0.0 05/02/2018 1423    . CMP Latest Ref Rng & Units 05/02/2018 03/01/2018 07/04/2017  Glucose 70 - 99 mg/dL 101(H) - -  BUN 8 - 23 mg/dL 21 17 19   Creatinine 0.61 - 1.24 mg/dL 0.83 0.7 0.7  Sodium 135 - 145 mmol/L 138 141 141  Potassium 3.5 - 5.1 mmol/L 4.0 6.0(A) 4.7  Chloride 98 - 111 mmol/L 103 - -  CO2 22 - 32 mmol/L 28 - -  Calcium 8.9 - 10.3 mg/dL 9.3 - -  Total Protein 6.5 - 8.1 g/dL 7.1 - -  Total Bilirubin 0.3 - 1.2 mg/dL 0.7 - -  Alkaline Phos 38 - 126 U/L 66 65 66  AST 15 - 41 U/L 26 28 30   ALT 0 - 44 U/L 38 31 37   03/01/18 CBC w/diff:    RADIOGRAPHIC STUDIES: I have personally reviewed the radiological images as listed and agreed with the findings in the report. US Soft Tissue Lower Extremity Limited Right (non-vascular)  Result Date: 05/15/2018 CLINICAL DATA:  History of melanoma. Soft tissue nodule. Nodule is hyperintense on left knee MRI. EXAM: ULTRASOUND LEFT LOWER EXTREMITY LIMITED TECHNIQUE: Ultrasound examination of the lower extremity soft  tissues was performed in the area of clinical concern. COMPARISON:  Left knee MRI 04/09/2018. FINDINGS: A 0.7 x 1.0 x 0.4 cm septated hypoechoic nodule is noted. Some internal color flow is noted. This nodule could be benign or malignant. If further evaluation dedicated soft tissue MRI with gadolinium enhancement can be  obtained. IMPRESSION: 0.7 x 1.0 x 0.4 cm septated hypoechoic nodule with some internal color flow is noted. This nodule could be benign or malignant. If further radiologic evaluation is needed dedicated soft tissue MRI with gadolinium enhancement can be obtained. Electronically Signed   By: Marcello Moores  Register   On: 05/15/2018 06:47   [place] Fna Soft Tissue  Result Date: 06/04/2018 CLINICAL DATA:  Ultrasound was provided for use by the ordering physician, and a technical charge was applied by the performing facility.  No radiologist interpretation/professional services rendered.    05/17/18 Left Knee Biopsy:    04/09/18 MRI Left Knee:     ASSESSMENT & PLAN:   77 y.o. male with  1. Ovoid lesion near the medial aspect of  of left knee ? Injection granuloma vs other etiology for lesion. however given previous h/o melanoma and lymphoma will need to be cautious and try to wotrk this up.  04/09/18 MRI of Left Knee revealed An ovoid, lobulated, 52mm T2 hyperintense focus within the medial subcutaneous far 1.45mm distal to the joint demonstrates demonstrates nonspecific imaging characteristics.   2. MGUS- IgM kappa monoclonal paraproteinemia  3/ h/o Melanoma and DLBCL  PLAN -Continue follow up with dermatologist Dr. Wilhemina Bonito for skin screening -Discussed the 05/17/18 US guided Soft Tissue Biopsy of the Left Knee which did not identify any malignant cells and revealed inflammatory cells.   -Discussed pt labwork from 05/17/18; no leukocytosis, no anemia, no thrombocytopenia.  -Discussed the 05/02/18 MMP which revealed M Protein at 0.5 with immunofixation revealing IgM with kappa light  chains -05/02/18 CMP revealed normal Calcium at 9.3, normal renal function with Creatinine at 0.83  -Discussed that the pt's history of Melanoma and DLBCL indicated an extensive work up of his left knee ovoid lesion. Monoclonal IgM protein was incidentally found with kappa light chain specificity. The pt ha no constitutional symptoms and no blood counts abnormalities. Discussed that it would be reasonable to work this up further or watch this over time, based on the patient's goals of care. -The pt notes that he would prefer a more conservative approach, and would like his monoclonal IgM protein watched again in four months -Will see the pt back in 4 months with labs, sooner if any new concerns arise   -RTC with Dr Irene Limbo in 4 months with labs (Please schedule labs 1 week prior to clinic visit)   All of the patients questions were answered with apparent satisfaction. The patient knows to call the clinic with any problems, questions or concerns.  The total time spent in the appt was 25 minutes and more than 50% was on counseling and direct patient cares and forwarding information to PCP.Marland Kitchen    Sullivan Lone MD MS AAHIVMS [facility] Hematology/Oncology Physician [facility]  (Office):       [phone] (Work cell):  [phone] (Fax):           [phone]  06/08/2018 9:31 AM  I, Baldwin [place], am acting as a scribe for Dr. Irene Limbo  .I have reviewed the above documentation for accuracy and completeness, and I agree with the above. Brunetta Genera MD

## 2018-06-08 ENCOUNTER — Encounter: Payer: Self-pay | Admitting: Hematology

## 2018-06-08 ENCOUNTER — Inpatient Hospital Stay: Payer: PPO | Attending: Hematology | Admitting: Hematology

## 2018-06-08 ENCOUNTER — Telehealth: Payer: Self-pay | Admitting: Hematology

## 2018-06-08 DIAGNOSIS — Z85820 Personal history of malignant melanoma of skin: Secondary | ICD-10-CM | POA: Diagnosis not present

## 2018-06-08 DIAGNOSIS — M7989 Other specified soft tissue disorders: Secondary | ICD-10-CM

## 2018-06-08 DIAGNOSIS — R2240 Localized swelling, mass and lump, unspecified lower limb: Secondary | ICD-10-CM | POA: Diagnosis not present

## 2018-06-08 DIAGNOSIS — I10 Essential (primary) hypertension: Secondary | ICD-10-CM | POA: Insufficient documentation

## 2018-06-08 DIAGNOSIS — D7589 Other specified diseases of blood and blood-forming organs: Secondary | ICD-10-CM | POA: Insufficient documentation

## 2018-06-08 DIAGNOSIS — Z79899 Other long term (current) drug therapy: Secondary | ICD-10-CM | POA: Diagnosis not present

## 2018-06-08 DIAGNOSIS — D472 Monoclonal gammopathy: Secondary | ICD-10-CM | POA: Diagnosis not present

## 2018-06-08 NOTE — Telephone Encounter (Signed)
Gave PT avs and calendar of upcoming appts.

## 2018-06-29 NOTE — Progress Notes (Signed)
[facility] Note  07/02/2018     CHIEF COMPLAINT Patient presents for Retina Follow Up   HISTORY OF PRESENT ILLNESS: Antonio [name] is a 77 y.o. male who presents to the clinic today for:   HPI    Retina Follow Up    Patient presents with  Wet AMD.  In both eyes.  Severity is moderate.  Duration of 8 months.  Since onset it is stable.  I, the attending physician,  performed the HPI with the patient and updated documentation appropriately.          Comments    Pt presents for non exu ARMD OU f/u, pt states his vision has been "fine", he states his floaters have gotten worse, but he denies FOL, pain and wavy vision,        Last edited by Bernarda Caffey, MD on 07/02/2018  1:45 PM. (History)    Pt states he has not been using the Foresee; Pt states he "turned it off for the summer";   Referring physician: [name], DO [place], [place] [address]  HISTORICAL INFORMATION:   Selected notes from the [facility] for ARMD OU; Moved from [place], [place]  Ocular Hx - nonexudative ARMD OU; PVD OU; pseudophakia OU (Toric OU - 12/2016 by Dr. Nance Pear, [place], [place]);  PMH - HTN; [facility] - ARMD - Mother   CURRENT MEDICATIONS: No current outpatient medications on file. (Ophthalmic Drugs)   No current facility-administered medications for this visit.  (Ophthalmic Drugs)   Current Outpatient Medications (Other)  Medication Sig  . chlorpheniramine-HYDROcodone (TUSSIONEX PENNKINETIC ER) 10-8 MG/5ML SUER Take 5 mLs by mouth every 12 (twelve) hours as needed for cough.  . Cholecalciferol (VITAMIN D3) 2000 units TABS Take 1 tablet by mouth daily.  . Cholecalciferol (VITAMIN D3) 2000 units TABS Vitamin D3 2,000 unit capsule  Take by oral route.  . diclofenac sodium (VOLTAREN) 1 % GEL Apply 2 g topically 3 (three) times daily.  Marland Kitchen losartan (COZAAR) 100 MG tablet Take 1 tablet (100 mg total) by mouth daily.  . meloxicam  (MOBIC) 7.5 MG tablet Take 1 tablet (7.5 mg total) by mouth 2 (two) times daily.  . Multiple Vitamins-Minerals (PRESERVISION AREDS 2 PO) Take 2 capsules by mouth daily.  . simvastatin (ZOCOR) 40 MG tablet Take 1 tablet (40 mg total) by mouth daily.  . valACYclovir (VALTREX) 1000 MG tablet   . Zoster Vaccine Adjuvanted [facility]) injection Shingrix (PF) 50 mcg/0.5 mL intramuscular suspension, kit   No current facility-administered medications for this visit.  (Other)      REVIEW OF SYSTEMS: ROS    Positive for: Endocrine, Cardiovascular, Eyes   Negative for: Constitutional, Gastrointestinal, Neurological, Skin, Genitourinary, Musculoskeletal, HENT, Respiratory, Psychiatric, Allergic/Imm, Heme/Lymph   Last edited by Debbrah Alar, COT on 07/02/2018  1:11 PM. (History)       ALLERGIES No Known Allergies  PAST MEDICAL HISTORY Past Medical History:  Diagnosis Date  . 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 BCC (basal cell carcinoma of skin)   . Carotid stenosis, asymptomatic, left   . Cataracts, bilateral   . Elevated diaphragm    right  . Erectile dysfunction   . Hyperlipidemia   . Hypertension   . Kidney stone   . 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 Melanoma ([place])   . Melanoma ([place])   . Posterior vitreous detachment   .  SCC (squamous cell carcinoma)   . Status post joint replacement    right TKA   Past Surgical History:  Procedure Laterality Date  . CATARACT EXTRACTION Bilateral 01/2017   Dr. Merril Abbe [facility], [place])   . CATARACT EXTRACTION W/ INTRAOCULAR LENS IMPLANT Bilateral 2018  . MELANOMA EXCISION  10/1999  . TOTAL KNEE ARTHROPLASTY  06/2009    FAMILY HISTORY Family History  Problem Relation Age of Onset  . Cataracts Mother   . Transient ischemic attack Mother   . Asthma Mother   . Cataracts Father   . Diabetes Father        borderline  . Hypertension Father   . Hyperlipidemia Father   . Hyperlipidemia Son   . Hypertension  Son   . Amblyopia Neg Hx   . Blindness Neg Hx   . Glaucoma Neg Hx   . Macular degeneration Neg Hx   . Retinal detachment Neg Hx   . Strabismus Neg Hx   . Retinitis pigmentosa Neg Hx     SOCIAL HISTORY Social History   Tobacco Use  . Smoking status: Never Smoker  . Smokeless tobacco: Never Used  Substance Use Topics  . Alcohol use: Yes    Comment: 7 to 9 drinks per week  . Drug use: No         OPHTHALMIC EXAM:  Base Eye Exam    Visual Acuity (Snellen - Linear)      Right Left   Dist [place] 20/20 20/20 -1   Dist ph [place]  20/20       Tonometry (Tonopen, 1:16 PM)      Right Left   Pressure 15 13       Pupils      Dark Light Shape React APD   Right 3 2 Round Brisk None   Left 3 2 Round Brisk None       Visual Fields (Counting fingers)      Left Right    Full Full       Extraocular Movement      Right Left    Full, Ortho Full, Ortho       Neuro/Psych    Oriented x3:  Yes   Mood/Affect:  Normal       Dilation    Both eyes:  1.0% Mydriacyl, 2.5% Phenylephrine @ 1:16 PM        Slit Lamp and Fundus Exam    External Exam      Right Left   External Brow ptosis - mild Brow ptosis - mild       Slit Lamp Exam      Right Left   Lids/Lashes Dermatochalasis - upper lid Dermatochalasis - upper lid   Conjunctiva/Sclera White and quiet White and quiet   Cornea trace endo pigment; well healed femoto laser wound, Arcus trace endo pigment; well healed femoto laser wound   Anterior Chamber Deep and quiet Deep and quiet   Iris Round with moderate dilated to 22m Round with moderate dilated to 79m  Lens Posterior chamber intraocular lens in good postion, trace Posterior capsular opacification Posterior chamber intraocular lens in good postion; trace Posterior capsular opacification   Vitreous Vitreous syneresis, Posterior vitreous detachment Vitreous syneresis, Posterior vitreous detachment       Fundus Exam      Right Left   Disc compact with Peripapillary atrophy  360 compact with Peripapillary atrophy 360   C/D Ratio 0.5 0.5   Macula Retinal pigment epithelial mottling and clumping ,  Good foveal reflex, Drusen, RPE mottling and clumping Flat; Drusen, Retinal pigment epithelial mottling and clumping    Vessels AV crossing changes - mild, Vascular attenuation AV crossing changes, Vascular attenuation, Tortuous   Periphery Attached; Reticular degeneration;  Attached; Reticular degeneration;           IMAGING AND PROCEDURES  Imaging and Procedures for 11/10/17  OCT, Retina - OU - Both Eyes       Right Eye Quality was good. Central Foveal Thickness: 321. Progression has been stable. Findings include normal foveal contour, no IRF, no SRF, retinal drusen  (Focal areas of outter retinal atrophy / ellipsoid dropout; scattered drusen).   Left Eye Quality was good. Central Foveal Thickness: 325. Progression has been stable. Findings include normal foveal contour, no IRF, no SRF, retinal drusen  (Scattered drusen; ).   Notes Images captured and stored on drive  Diagnosis / Impression:  Nonexudative ARMD OU  Clinical management:  See below  Abbreviations: NFP - Normal foveal profile. CME - cystoid macular edema. PED - pigment epithelial detachment. IRF - intraretinal fluid. SRF - subretinal fluid. EZ - ellipsoid zone. ERM - epiretinal membrane. ORA - outer retinal atrophy. ORT - outer retinal tubulation. SRHM - subretinal hyper-reflective material                  ASSESSMENT/PLAN:    ICD-10-CM   1. Intermediate stage nonexudative age-related macular degeneration of both eyes H35.3132 OCT, Retina - OU - Both Eyes  2. Pseudophakia of both eyes Z96.1   3. 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 Bilateral posterior capsular opacification H26.493     1. Age related macular degeneration, non-exudative, both eyes  - The incidence, anatomy, and pathology of dry AMD, risk of progression, and the AREDS and AREDS 2 study including smoking  risks discussed with patient.  - has a Foresee AMD monitoring device at home - pt holding off on testing unitl Oct/November once he stops traveling  - no significant progression on exam or OCT -- scattered drusen and ORA  - f/u 6 months, sooner/PRN  2. Pseudophakia OU  - s/p CE/PCIOL OU (Toric + femto) 12/2016 by Dr. Nance Pear in [facility]  - had post op IOP issue OS  - beautiful surgery, doing well  - monitor  3. H/o ocular hypertension OU  - IOP good today off combigan  - monitor  4. PCO OU  - Discussed YAG Cap treatment for PCO;  - not yet visually significant  - monitor for now  Ophthalmic Meds Ordered this visit:  No orders of the defined types were placed in this encounter.      Return in about 6 months (around 12/31/2018) for F/U Non-Exu AMD OU, DFE, OCT.  There are no Patient Instructions on file for this visit.   Explained the diagnoses, plan, and follow up with the patient and they expressed understanding.  Patient expressed understanding of the importance of proper follow up care.   This document serves as a record of services personally performed by Gardiner Sleeper, MD, PhD. It was created on their behalf by Ernest Mallick, OA, an ophthalmic assistant. The creation of this record is the provider's dictation and/or activities during the visit.    Electronically signed by: Ernest Mallick, OA  09.13.19 2:38 PM   This document serves as a record of services personally performed by Gardiner Sleeper, MD, PhD. It was created on their behalf by Catha Brow, [place], a certified ophthalmic assistant. The  creation of this record is the provider's dictation and/or activities during the visit.  Electronically signed by: Catha Brow, [place]  09.16.19 2:38 PM   Gardiner Sleeper, M.D., Ph.D. Vitreoretinal Surgeon [facility]   I have reviewed the above documentation for accuracy and completeness, and I agree with the above. Gardiner Sleeper, M.D., Ph.D.  07/02/18 2:38 PM      Abbreviations: M myopia (nearsighted); A astigmatism; H hyperopia (farsighted); P presbyopia; Mrx spectacle prescription;  CTL contact lenses; OD right eye; OS left eye; OU both eyes  XT exotropia; ET esotropia; PEK punctate epithelial keratitis; PEE punctate epithelial erosions; DES dry eye syndrome; MGD meibomian gland dysfunction; ATs artificial tears; PFAT's preservative free artificial tears; [place] nuclear sclerotic cataract; PSC posterior subcapsular cataract; ERM epi-retinal membrane; PVD posterior vitreous detachment; RD retinal detachment; DM diabetes mellitus; DR diabetic retinopathy; NPDR non-proliferative diabetic retinopathy; PDR proliferative diabetic retinopathy; CSME clinically significant macular edema; DME diabetic macular edema; dbh dot blot hemorrhages; CWS cotton wool spot; POAG primary open angle glaucoma; C/D cup-to-disc ratio; HVF humphrey visual field; GVF goldmann visual field; OCT optical coherence tomography; IOP intraocular pressure; BRVO Branch retinal vein occlusion; CRVO central retinal vein occlusion; CRAO central retinal artery occlusion; BRAO branch retinal artery occlusion; RT retinal tear; SB scleral buckle; PPV pars plana vitrectomy; VH Vitreous hemorrhage; PRP panretinal laser photocoagulation; IVK intravitreal kenalog; VMT vitreomacular traction; MH Macular hole;  NVD neovascularization of the disc; NVE neovascularization elsewhere; AREDS age related eye disease study; ARMD age related macular degeneration; POAG primary open angle glaucoma; EBMD epithelial/anterior basement membrane dystrophy; ACIOL anterior chamber intraocular lens; IOL intraocular lens; PCIOL posterior chamber intraocular lens; Phaco/IOL phacoemulsification with intraocular lens placement; [place] photorefractive keratectomy; LASIK laser assisted in situ keratomileusis; HTN hypertension; DM diabetes mellitus; COPD chronic obstructive pulmonary disease

## 2018-07-02 ENCOUNTER — Encounter (INDEPENDENT_AMBULATORY_CARE_PROVIDER_SITE_OTHER): Payer: Self-pay | Admitting: Ophthalmology

## 2018-07-02 ENCOUNTER — Ambulatory Visit (INDEPENDENT_AMBULATORY_CARE_PROVIDER_SITE_OTHER): Payer: PPO | Admitting: Ophthalmology

## 2018-07-02 DIAGNOSIS — H26493 Other secondary cataract, bilateral: Secondary | ICD-10-CM | POA: Diagnosis not present

## 2018-07-02 DIAGNOSIS — Z961 Presence of intraocular lens: Secondary | ICD-10-CM

## 2018-07-02 DIAGNOSIS — H353132 Nonexudative age-related macular degeneration, bilateral, intermediate dry stage: Secondary | ICD-10-CM | POA: Diagnosis not present

## 2018-07-02 DIAGNOSIS — H40053 Ocular hypertension, bilateral: Secondary | ICD-10-CM | POA: Diagnosis not present

## 2018-07-12 ENCOUNTER — Other Ambulatory Visit: Payer: Self-pay | Admitting: Internal Medicine

## 2018-08-15 DIAGNOSIS — Z0000 Encounter for general adult medical examination without abnormal findings: Secondary | ICD-10-CM | POA: Diagnosis not present

## 2018-08-20 DIAGNOSIS — Z85820 Personal history of malignant melanoma of skin: Secondary | ICD-10-CM | POA: Diagnosis not present

## 2018-08-20 DIAGNOSIS — D485 Neoplasm of uncertain behavior of skin: Secondary | ICD-10-CM | POA: Diagnosis not present

## 2018-08-20 DIAGNOSIS — C44722 Squamous cell carcinoma of skin of right lower limb, including hip: Secondary | ICD-10-CM | POA: Diagnosis not present

## 2018-08-20 DIAGNOSIS — L570 Actinic keratosis: Secondary | ICD-10-CM | POA: Diagnosis not present

## 2018-08-28 ENCOUNTER — Other Ambulatory Visit: Payer: Self-pay | Admitting: Internal Medicine

## 2018-09-05 ENCOUNTER — Encounter: Payer: Self-pay | Admitting: Internal Medicine

## 2018-09-05 ENCOUNTER — Non-Acute Institutional Stay: Payer: PPO | Admitting: Internal Medicine

## 2018-09-05 VITALS — BP 138/70 | HR 84 | Temp 98.3°F | Ht 72.0 in | Wt 193.0 lb

## 2018-09-05 DIAGNOSIS — K5901 Slow transit constipation: Secondary | ICD-10-CM | POA: Diagnosis not present

## 2018-09-05 DIAGNOSIS — J069 Acute upper respiratory infection, unspecified: Secondary | ICD-10-CM

## 2018-09-05 DIAGNOSIS — I10 Essential (primary) hypertension: Secondary | ICD-10-CM | POA: Diagnosis not present

## 2018-09-05 DIAGNOSIS — Z8572 Personal history of non-Hodgkin lymphomas: Secondary | ICD-10-CM | POA: Diagnosis not present

## 2018-09-05 DIAGNOSIS — D472 Monoclonal gammopathy: Secondary | ICD-10-CM | POA: Insufficient documentation

## 2018-09-05 DIAGNOSIS — E559 Vitamin D deficiency, unspecified: Secondary | ICD-10-CM

## 2018-09-05 DIAGNOSIS — R739 Hyperglycemia, unspecified: Secondary | ICD-10-CM | POA: Insufficient documentation

## 2018-09-05 DIAGNOSIS — B9789 Other viral agents as the cause of diseases classified elsewhere: Secondary | ICD-10-CM

## 2018-09-05 DIAGNOSIS — R7301 Impaired fasting glucose: Secondary | ICD-10-CM | POA: Diagnosis not present

## 2018-09-05 DIAGNOSIS — D7589 Other specified diseases of blood and blood-forming organs: Secondary | ICD-10-CM | POA: Insufficient documentation

## 2018-09-05 DIAGNOSIS — R2242 Localized swelling, mass and lump, left lower limb: Secondary | ICD-10-CM | POA: Diagnosis not present

## 2018-09-05 DIAGNOSIS — E7800 Pure hypercholesterolemia, unspecified: Secondary | ICD-10-CM

## 2018-09-05 DIAGNOSIS — H353122 Nonexudative age-related macular degeneration, left eye, intermediate dry stage: Secondary | ICD-10-CM | POA: Diagnosis not present

## 2018-09-05 DIAGNOSIS — M7989 Other specified soft tissue disorders: Secondary | ICD-10-CM | POA: Insufficient documentation

## 2018-09-05 NOTE — Progress Notes (Signed)
Location:  Occupational psychologist of Service:  Clinic (12)  Provider: Sinai Illingworth L. Mariea Clonts, D.O., C.M.D.  Code Status: need to discuss Goals of Care:  Advanced Directives 05/17/2018  Does Patient Have a Medical Advance Directive? Yes  Type of Advance Directive [place]  Does patient want to make changes to medical advance directive? No - Patient declined  Copy of [place] in Chart? No - copy requested  Pre-existing out of facility DNR order (yellow form or pink MOST form) -   Chief Complaint  Patient presents with  . Medical Management of Chronic Issues    59mh follow-up    HPI: Patient is a 77y.o. male seen today for medical management of chronic diseases.    He has had 1.5 weeks of congestion with coughing.  He's taking benzonatate 2052mand singulair 1060maily.  He just started this at 2am.  Says he got these prescribed by EldCharisse Klinefelterom his medicare advantage.  He had the perles in the past.  The codeine type is the only thing that really worked before.    His fasting sugar was elevated at 104 on his lifeline screening.  Cholesterol was great.  He does not eat starchy foods and limits his bread and sweets already.  His dad had prediabetes also.  BP was better here, but was 155/99 three times--twice at lifeline screen and at home with his cuff.  He is aware if he eats out and food is salty b/c he does not add salt himself.  He had not taken his bp medication the day of his screening.  He just took it today before coming.    He was kept awake for the biopsy with an ultrasound.  Pathologist was there to analyze in the OR.  This was benign but he has some elevated immunoglobulins and large RBCs.    Reviewed lifeline screening results with him.  Bone density from heel scan low--discussed vitamin D intake, sun exposure, weightbearing exercise and possibly getting dexa on him, but he would like to just keep monitoring at this  point.    Past Medical History:  Diagnosis Date  . B-cell lymphoma ([place]  testicular diffuse large b-cell lymphoma  . BCC (basal cell carcinoma of skin)   . Carotid stenosis, asymptomatic, left   . Cataracts, bilateral   . Elevated diaphragm    right  . Erectile dysfunction   . Hyperlipidemia   . Hypertension   . Kidney stone   . Lymphoma of testis ([place] . Melanoma ([place] . Melanoma ([place] . Posterior vitreous detachment   . SCC (squamous cell carcinoma)   . Status post joint replacement    right TKA    Past Surgical History:  Procedure Laterality Date  . CATARACT EXTRACTION Bilateral 01/2017   [facility] . CATARACT EXTRACTION W/ INTRAOCULAR LENS IMPLANT Bilateral 2018  . MELANOMA EXCISION  10/1999  . TOTAL KNEE ARTHROPLASTY  06/2009    No Known Allergies  Outpatient Encounter Medications as of 09/05/2018  Medication Sig  . benzonatate (TESSALON) 200 MG capsule Take 200 mg by mouth 3 (three) times daily as needed for cough.  . Cholecalciferol (VITAMIN D3) 2000 units TABS Take 1 tablet by mouth daily.  . lMarland Kitchensartan (COZAAR) 100 MG tablet Take 1 tablet (100 mg total) by mouth daily.  . meloxicam (MOBIC) 7.5 MG tablet TAKE ONE TABLET BY MOUTH  TWICE DAILY   . montelukast (SINGULAIR) 10 MG tablet   . Multiple Vitamins-Minerals (PRESERVISION AREDS 2 PO) Take 2 capsules by mouth daily.  . simvastatin (ZOCOR) 40 MG tablet TAKE ONE TABLET BY MOUTH ONE TIME DAILY   . valACYclovir (VALTREX) 1000 MG tablet   . [DISCONTINUED] chlorpheniramine-HYDROcodone (TUSSIONEX PENNKINETIC ER) 10-8 MG/5ML SUER Take 5 mLs by mouth every 12 (twelve) hours as needed for cough.  . [DISCONTINUED] Cholecalciferol (VITAMIN D3) 2000 units TABS Vitamin D3 2,000 unit capsule  Take by oral route.  . [DISCONTINUED] diclofenac sodium (VOLTAREN) 1 % GEL Apply 2 g topically 3 (three) times daily.  . [DISCONTINUED] simvastatin (ZOCOR) 40 MG tablet Take 1 tablet (40 mg total) by  mouth daily.  . [DISCONTINUED] Zoster Vaccine Adjuvanted [facility]) injection Shingrix (PF) 50 mcg/0.5 mL intramuscular suspension, kit   No facility-administered encounter medications on file as of 09/05/2018.     Review of Systems:  Review of Systems  Constitutional: Negative for chills, fever and malaise/fatigue.  HENT: Positive for congestion and hearing loss. Negative for sore throat.   Eyes: Negative for blurred vision.  Respiratory: Positive for cough and sputum production. Negative for shortness of breath.   Cardiovascular: Negative for chest pain, palpitations and leg swelling.  Gastrointestinal: Negative for abdominal pain, blood in stool, constipation, diarrhea, heartburn and melena.  Genitourinary: Negative for dysuria.  Musculoskeletal: Negative for falls and joint pain.  Skin: Negative for itching and rash.  Neurological: Negative for dizziness and loss of consciousness.  Endo/Heme/Allergies: Does not bruise/bleed easily.  Psychiatric/Behavioral: Negative for depression and memory loss. The patient does not have insomnia.     Health Maintenance  Topic Date Due  . TETANUS/TDAP  03/04/2024  . INFLUENZA VACCINE  Completed  . PNA vac Low Risk Adult  Completed    Physical Exam: Vitals:   09/05/18 0924  BP: 138/70  Pulse: (!) 103  Temp: 98.3 F (36.8 C)  TempSrc: Oral  SpO2: 96%  Weight: 193 lb (87.5 kg)  Height: 6' (1.829 m)   Body mass index is 26.18 kg/m. Physical Exam  Constitutional: He is oriented to person, place, and time. He appears well-developed and well-nourished. No distress.  HENT:  Head: Normocephalic and atraumatic.  Right Ear: External ear normal.  Left Ear: External ear normal.  Nose: Nose normal.  Mouth/Throat: Oropharynx is clear and moist. No oropharyngeal exudate.  Some postnasal drip and erythema; no white patches, no lymphadenopathy  Eyes:  glasses  Neck: Neck supple.  Cardiovascular: Normal rate, regular rhythm, normal heart  sounds and intact distal pulses.  Pulmonary/Chest: Effort normal and breath sounds normal. No respiratory distress.  Musculoskeletal: Normal range of motion.  Lymphadenopathy:    He has no cervical adenopathy.  Neurological: He is alert and oriented to person, place, and time.  Skin: Skin is warm and dry.  Psychiatric: He has a normal mood and affect.    Labs reviewed: Basic Metabolic Panel: Recent Labs    03/01/18 0600 05/02/18 1423  NA 141 138  K 6.0* 4.0  CL  --  103  CO2  --  28  GLUCOSE  --  101*  BUN 17 21  CREATININE 0.7 0.83  CALCIUM  --  9.3   Liver Function Tests: Recent Labs    03/01/18 0600 05/02/18 1423  AST 28 26  ALT 31 38  ALKPHOS 65 66  BILITOT  --  0.7  PROT  --  7.1  ALBUMIN  --  4.4   No  results for input(s): LIPASE, AMYLASE in the last 8760 hours. No results for input(s): AMMONIA in the last 8760 hours. CBC: Recent Labs    03/01/18 0600 05/02/18 1423 05/17/18 1109  WBC 6.2 6.2 7.0  NEUTROABS  --  4.4  --   HGB 15.4 13.7 14.7  HCT 46 41.3 44.7  MCV  --  101.3* 101.8*  PLT 192 184 219   Lipid Panel: Recent Labs    03/01/18 0600  CHOL 181  HDL 75*  LDLCALC 88  TRIG 93   Lab Results  Component Value Date   HGBA1C 5.7 12/11/2015     Assessment/Plan 1. Elevated fasting glucose -on lifeline, check hba1c with regular labs before his CPE in 6 mos -has several other labs with hematology before we meet again  2. Benign essential HTN -bp better than last time--at goal--cont losartan  3. Slow transit constipation -no recent complaints, encourage hydration, fiber and activity  4. History of B-cell lymphoma -noted, following with hematology here, Dr. 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 Pure hypercholesterolemia -lipids satisfactory on lifeline screen, but will check via routine annual fasting labwork  6. Vitamin D deficiency -continue vitamin D supplement  and recommended 20 mins unprotected sun daily to help with this  7. Viral URI with cough -continue singulair and tessalon prescribed by "insurance doctor" and see if he improves--no fever, chills and lungs clear -to call back if symptoms not improving next week (he'll be traveling though)  8. Mass of soft tissue of left lower extremity -found by biopsy to be benign, fortunately  9. Monoclonal gammopathy of unknown significance (MGUS) -noted with elevated immunoglobulins but not myeloma, has f/u labs before he's seen by Dr Irene Limbo again for regular monitoring  10. Macrocytosis without anemia -has b12 and folate tests coming up to evaluate lge rbcs--discussed with him today  Labs/tests ordered:  flp and hba1c before 6 mo visit Next appt:  6 mos for CPE, fasting labs before; also will review hematology labs when done  Seldon Barrell L. Astha Probasco, D.O. [facility] [address], [place] [address] Cell Phone (Mon-Fri 8am-5pm):  [phone] On Call:  [phone] & follow prompts after 5pm & weekends Office Phone:  [phone] Office Fax:  [phone]

## 2018-09-19 ENCOUNTER — Other Ambulatory Visit: Payer: Self-pay | Admitting: Internal Medicine

## 2018-09-25 ENCOUNTER — Telehealth (INDEPENDENT_AMBULATORY_CARE_PROVIDER_SITE_OTHER): Payer: Self-pay

## 2018-10-05 DIAGNOSIS — H353122 Nonexudative age-related macular degeneration, left eye, intermediate dry stage: Secondary | ICD-10-CM | POA: Diagnosis not present

## 2018-10-29 ENCOUNTER — Other Ambulatory Visit: Payer: Self-pay | Admitting: Internal Medicine

## 2018-11-12 ENCOUNTER — Inpatient Hospital Stay: Payer: PPO | Attending: Hematology

## 2018-11-12 DIAGNOSIS — Z85820 Personal history of malignant melanoma of skin: Secondary | ICD-10-CM | POA: Insufficient documentation

## 2018-11-12 DIAGNOSIS — I10 Essential (primary) hypertension: Secondary | ICD-10-CM | POA: Diagnosis not present

## 2018-11-12 DIAGNOSIS — D7589 Other specified diseases of blood and blood-forming organs: Secondary | ICD-10-CM

## 2018-11-12 DIAGNOSIS — D472 Monoclonal gammopathy: Secondary | ICD-10-CM | POA: Diagnosis not present

## 2018-11-12 DIAGNOSIS — R2240 Localized swelling, mass and lump, unspecified lower limb: Secondary | ICD-10-CM | POA: Insufficient documentation

## 2018-11-12 LAB — CMP (CANCER CENTER ONLY)
ALT: 32 U/L (ref 0–44)
AST: 22 U/L (ref 15–41)
Albumin: 4.5 g/dL (ref 3.5–5.0)
Alkaline Phosphatase: 72 U/L (ref 38–126)
Anion gap: 7 (ref 5–15)
BUN: 14 mg/dL (ref 8–23)
CO2: 30 mmol/L (ref 22–32)
Calcium: 9.6 mg/dL (ref 8.9–10.3)
Chloride: 106 mmol/L (ref 98–111)
Creatinine: 0.8 mg/dL (ref 0.61–1.24)
GFR, Est AFR Am: >60 mL/min (ref >60)
GFR, Estimated: >60 mL/min (ref >60)
Glucose, Bld: 101 mg/dL — ABNORMAL HIGH (ref 70–99)
Potassium: 4.1 mmol/L (ref 3.5–5.1)
Sodium: 143 mmol/L (ref 135–145)
Total Bilirubin: 0.9 mg/dL (ref 0.3–1.2)
Total Protein: 7.7 g/dL (ref 6.5–8.1)

## 2018-11-12 LAB — RETICULOCYTES
Immature Retic Fract: 10.9 % (ref 2.3–15.9)
RBC.: 4.56 MIL/uL (ref 4.22–5.81)
Retic Count, Absolute: 59.3 10*3/uL (ref 19.0–186.0)
Retic Ct Pct: 1.3 % (ref 0.4–3.1)

## 2018-11-12 LAB — CBC WITH DIFFERENTIAL/PLATELET
Abs Immature Granulocytes: 0.03 10*3/uL (ref 0.00–0.07)
Basophils Absolute: 0 10*3/uL (ref 0.0–0.1)
Basophils Relative: 1 %
Eosinophils Absolute: 0 10*3/uL (ref 0.0–0.5)
Eosinophils Relative: 1 %
HCT: 46.6 % (ref 39.0–52.0)
Hemoglobin: 15 g/dL (ref 13.0–17.0)
Immature Granulocytes: 1 %
Lymphocytes Relative: 25 %
Lymphs Abs: 1.5 10*3/uL (ref 0.7–4.0)
MCH: 32.9 pg (ref 26.0–34.0)
MCHC: 32.2 g/dL (ref 30.0–36.0)
MCV: 102.2 fL — ABNORMAL HIGH (ref 80.0–100.0)
Monocytes Absolute: 0.5 10*3/uL (ref 0.1–1.0)
Monocytes Relative: 8 %
Neutro Abs: 4 10*3/uL (ref 1.7–7.7)
Neutrophils Relative %: 64 %
Platelets: 220 10*3/uL (ref 150–400)
RBC: 4.56 MIL/uL (ref 4.22–5.81)
RDW: 12.8 % (ref 11.5–15.5)
WBC: 6.1 10*3/uL (ref 4.0–10.5)
nRBC: 0 % (ref 0.0–0.2)

## 2018-11-12 LAB — TSH: TSH: 1.569 u[IU]/mL (ref 0.320–4.118)

## 2018-11-12 LAB — VITAMIN B12: Vitamin B-12: 651 pg/mL (ref 180–914)

## 2018-11-12 LAB — LACTATE DEHYDROGENASE: LDH: 150 U/L (ref 98–192)

## 2018-11-13 ENCOUNTER — Telehealth: Payer: Self-pay | Admitting: Hematology

## 2018-11-13 LAB — FOLATE RBC
Folate, Hemolysate: 418 ng/mL
Folate, RBC: 909 ng/mL (ref >498)
Hematocrit: 46 % (ref 37.5–51.0)

## 2018-11-13 LAB — KAPPA/LAMBDA LIGHT CHAINS
Kappa free light chain: 24.7 mg/L — ABNORMAL HIGH (ref 3.3–19.4)
Kappa, lambda light chain ratio: 2.4 — ABNORMAL HIGH (ref 0.26–1.65)
Lambda free light chains: 10.3 mg/L (ref 5.7–26.3)

## 2018-11-13 NOTE — Telephone Encounter (Signed)
Tried to reach regarding voicemail I did leave a message °

## 2018-11-14 ENCOUNTER — Other Ambulatory Visit: Payer: Self-pay | Admitting: *Deleted

## 2018-11-14 LAB — MULTIPLE MYELOMA PANEL, SERUM
Albumin SerPl Elph-Mcnc: 4.3 g/dL (ref 2.9–4.4)
Albumin/Glob SerPl: 1.6 (ref 0.7–1.7)
Alpha 1: 0.2 g/dL (ref 0.0–0.4)
Alpha2 Glob SerPl Elph-Mcnc: 0.7 g/dL (ref 0.4–1.0)
B-Globulin SerPl Elph-Mcnc: 0.8 g/dL (ref 0.7–1.3)
Gamma Glob SerPl Elph-Mcnc: 1.1 g/dL (ref 0.4–1.8)
Globulin, Total: 2.8 g/dL (ref 2.2–3.9)
IgA: 124 mg/dL (ref 61–437)
IgG (Immunoglobin G), Serum: 736 mg/dL (ref 700–1600)
IgM (Immunoglobulin M), Srm: 899 mg/dL — ABNORMAL HIGH (ref 15–143)
M Protein SerPl Elph-Mcnc: 0.6 g/dL — ABNORMAL HIGH
Total Protein ELP: 7.1 g/dL (ref 6.0–8.5)

## 2018-11-14 MED ORDER — MONTELUKAST SODIUM 10 MG PO TABS: 10.0000 mg | ORAL_TABLET | Freq: Every day | ORAL | 1 refills | Status: DC

## 2018-11-19 ENCOUNTER — Ambulatory Visit: Payer: PPO | Admitting: Hematology

## 2018-12-05 NOTE — Progress Notes (Signed)
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 male who has been referred to [place] by Dr. Hollace Kinnier for evaluation and management of Bone lesion of left lower leg. The pt reports that he is doing well overall.   The pt reports that he cannot feel the ovoid finding from his 04/09/18 MRI as noted below. He denies being able to feel anything in his skin as well. He notes that he received a needle injection in his knee prior to this MRI. The pt notes that his knee has lost much cartilage and is considering a knee replacement. He denies any recent injuries to his knee or falls. He has contacted Dr. Alphonsa Overall in surgery and another surgeon as well and will be making an appointment.   The pt notes that he does not feel any differently recently as compared to 6 months to a year ago. He does note that he normally has constipation in the spring, and has continued to have mild constipation. He denies any other concerns or symptoms.   The pt previously saw my colleague Dr. Lurline Del for primary testicular B-cell lymphoma in 2000. The pt notes that the involvement was limited to only one testicle. He received 6 intrathecal treatments in his spine, and received 4 cycles of chemotherapy (thinks this was CHOP) and radiation as well. The pt was followed by an oncologist for several years at the [facility] in [place] after moving from [place] after his [place] treatment.   The pt notes that he was first diagnosed with melanoma in October 2000. He noticed a strange spot on his left leg that was surgically resected. The pt's personal notes report a 2.9cm thick, level 4, no LN involvement, high risk stage II, treated with  Interferon three times a week for one year. He notes some squamous cell and basal cell involvements as well and sees a dermatologist, Dr. Wilhemina Bonito, regularly.   Of note prior to the patient's visit today, pt has had MRI Left Knee completed on 04/09/18 with results revealing An ovoid, lobulated, 45m T2 hyperintense focus within the medial subcutaneous far 1.931mdistal to the joint demonstrates demonstrates nonspecific imaging characteristics.   Most recent lab results (03/01/18) of CBC w/diff is as follows: all values are WNL except for RBC at 4.54, MCV at 100.4, MCH at 33.8.   On review of systems, pt reports left knee pain, mild constipation, and denies new or concerning symptoms, noticing any new lumps or bumps, pain along the spine, abdominal pains, problems passing urine, testicular pain/swelling, and any other symptoms.   Interval History:   Antonio ARTERBERRYeturns today for management and evaluation of his  MGUS. The patient's last visit with [place] on 06[place]08/18. He is accompanied today by his wife. The pt reports that he is doing well overall.  The pt reports that he has been a little more tired recently. He notes that he has not been sleeping very well, but remains very active. He believes he energy is attributable "to age." He denies this keeping him from doing anything he wants to do.  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 The pt notes that he has had three colds/coughs this winter. He notes he is up to speed with both of his pneumonia vaccines as well as his flu shot. He also started Singulair about 3 weeks ago, and notes that this has "helped a lot."   Lab results (11/12/18) of CBC w/diff, Reticulocytes, and CMP is as follows: all values are WNL except for MCV at 102.2, Glucose at 101. 11/12/18 MMP revealed all values WNL except for IgM at 899 and M Protein of 0.6g of IgM kappa. 11/12/18 Vitamin B12 at 651 11/12/18 SFLC  revealed Kappa at 24.7, K:L ratio at 2.40  On review of systems, pt reports feeling more tired, remaining active, recent herpetic cold sores, recent cold, and denies noticing any new lumps or bumps, fevers, chills, night sweats, abdominal pains, leg swelling, and any other symptoms.  MEDICAL HISTORY:  Past Medical History:  Diagnosis Date  . B-cell lymphoma ([place])    testicular diffuse large b-cell lymphoma  . BCC (basal cell carcinoma of skin)   . Carotid stenosis, asymptomatic, left   . Cataracts, bilateral   . Elevated diaphragm    right  . Erectile dysfunction   . Hyperlipidemia   . Hypertension   . Kidney stone   . Lymphoma of testis ([place])   . Melanoma ([place])   . Melanoma ([place])   . Posterior vitreous detachment   . SCC (squamous cell carcinoma)   . Status post joint replacement    right TKA    SURGICAL HISTORY: Past Surgical History:  Procedure Laterality Date  . CATARACT EXTRACTION Bilateral 01/2017   Dr. Merril Abbe [facility] [place]   . CATARACT EXTRACTION W/ INTRAOCULAR LENS IMPLANT Bilateral 2018  . MELANOMA EXCISION  10/1999  . TOTAL KNEE ARTHROPLASTY  06/2009    SOCIAL HISTORY: Social History   Socioeconomic History  . Marital status: Married    Spouse name: Not on file  . Number of children: Not on file  . Years of education: Not on file  . Highest education level: Not on file  Occupational History  . Not on file  Social Needs  . Financial resource strain: Not hard at all  . Food insecurity:    Worry: Never true    Inability: Never true  . Transportation needs:    Medical: No    Non-medical: No  Tobacco Use  . Smoking status: Never Smoker  . Smokeless tobacco: Never Used  Substance and Sexual Activity  . Alcohol use: Yes    Comment: 7 to 9 drinks per week  . Drug use: No  . Sexual activity: Not Currently  Lifestyle  . Physical activity:    Days per week: 2 days    Minutes per session: 20 min  . Stress: To some extent  Relationships  .  Social connections:    Talks on phone: More than three times a week    Gets together: More than three times a week    Attends religious service: More than 4 times per year    Active member of club or organization: Yes    Attends meetings of clubs or organizations: More than 4 times per year    Relationship status: Married  . Intimate partner violence:    Fear of current or ex partner: No    Emotionally abused: No    Physically abused: No    Forced sexual activity: No  Other Topics Concern  . Not on file  Social History Narrative   Social  History      Diet?good      Do you drink/eat things with caffeine? seldom      Marital status?          yes                          What year were you married? 1962      Do you live in a house, apartment, assisted living, condo, trailer, etc.? house      Is it one or more stories?1      How many persons live in your home? 2      Do you have any pets in your home? (please list) no      Highest level of education completed? college      Current or past profession: president and [employer]      Do you exercise?   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 yes      Do you have a DNR form?       yes                            If not, do you want to discuss one?      Do you have signed POA/HPOA for forms?  yes      Functional Status      Do you have difficulty bathing or dressing yourself? no      Do you have difficulty preparing food or eating? no      Do you have difficulty managing your medications? no      Do you have difficulty managing your finances? no      Do you have difficulty affording your medications? no    FAMILY HISTORY: Family History  Problem Relation Age of Onset  . Cataracts Mother   . Transient ischemic attack Mother   . Asthma Mother   . Cataracts Father   . Diabetes Father        borderline  .  Hypertension Father   . Hyperlipidemia Father   . Hyperlipidemia Son   . Hypertension Son   . Amblyopia Neg Hx   . Blindness Neg Hx   . Glaucoma Neg Hx   . Macular degeneration Neg Hx   . Retinal detachment Neg Hx   . Strabismus Neg Hx   . Retinitis pigmentosa Neg Hx     ALLERGIES:  has No Known Allergies.  MEDICATIONS:  Current Outpatient Medications  Medication Sig Dispense Refill  . benzonatate (TESSALON) 200 MG capsule Take 200 mg by mouth 3 (three) times daily as needed for cough.    . Cholecalciferol (VITAMIN D3) 2000 units TABS Take 1 tablet by mouth daily.    Marland Kitchen losartan (COZAAR) 100 MG tablet TAKE ONE TABLET BY MOUTH ONE TIME DAILY  90 tablet 0  . meloxicam (MOBIC) 7.5 MG tablet TAKE ONE TABLET BY MOUTH TWICE DAILY  60 tablet 3  . montelukast (SINGULAIR) 10 MG tablet Take 1 tablet (10 mg total) by mouth at bedtime. 90 tablet 1  . Multiple Vitamins-Minerals (PRESERVISION AREDS 2 PO) Take  2 capsules by mouth daily.    . simvastatin (ZOCOR) 40 MG tablet TAKE ONE TABLET BY MOUTH ONE TIME DAILY  90 tablet 1  . valACYclovir (VALTREX) 1000 MG tablet      No current facility-administered medications for this visit.     REVIEW OF SYSTEMS:    A 10+ POINT REVIEW OF SYSTEMS WAS OBTAINED including neurology, dermatology, psychiatry, cardiac, respiratory, lymph, extremities, GI, GU, Musculoskeletal, constitutional, breasts, reproductive, HEENT.  All pertinent positives are noted in the HPI.  All others are negative.   PHYSICAL EXAMINATION: ECOG PERFORMANCE STATUS: 0 - Asymptomatic VS reviewed - stable  GENERAL:alert, in no acute distress and comfortable SKIN: no acute rashes, no significant lesions EYES: conjunctiva are pink and non-injected, sclera anicteric OROPHARYNX: MMM, no exudates, no oropharyngeal erythema or ulceration NECK: supple, no JVD LYMPH:  no palpable lymphadenopathy in the cervical, axillary or inguinal regions LUNGS: clear to auscultation b/l with normal  respiratory effort HEART: regular rate & rhythm ABDOMEN:  normoactive bowel sounds , non tender, not distended. No palpable hepatosplenomegaly.  Extremity: no pedal edema PSYCH: alert & oriented x 3 with fluent speech NEURO: no focal motor/sensory deficits   LABORATORY DATA:  I have reviewed the data as listed  . CBC Latest Ref Rng & Units 11/12/2018 11/12/2018 05/17/2018  WBC 4.0 - 10.5 K/uL 6.1 - 7.0  Hemoglobin 13.0 - 17.0 g/dL 15.0 - 14.7  Hematocrit 37.5 - 51.0 % 46.6 46.0 44.7  Platelets 150 - 400 K/uL 220 - 219   . CBC    Component Value Date/Time   WBC 6.1 11/12/2018 1106   RBC 4.56 11/12/2018 1106   RBC 4.56 11/12/2018 1106   HGB 15.0 11/12/2018 1106   HCT 46.0 11/12/2018 1106   HCT 46.6 11/12/2018 1106   PLT 220 11/12/2018 1106   MCV 102.2 (H) 11/12/2018 1106   MCH 32.9 11/12/2018 1106   MCHC 32.2 11/12/2018 1106   RDW 12.8 11/12/2018 1106   LYMPHSABS 1.5 11/12/2018 1106   MONOABS 0.5 11/12/2018 1106   EOSABS 0.0 11/12/2018 1106   BASOSABS 0.0 11/12/2018 1106    . CMP Latest Ref Rng & Units 11/12/2018 05/02/2018 03/01/2018  Glucose 70 - 99 mg/dL 101(H) 101(H) -  BUN 8 - 23 mg/dL '14 21 17  ' Creatinine 0.61 - 1.24 mg/dL 0.80 0.83 0.7  Sodium 135 - 145 mmol/L 143 138 141  Potassium 3.5 - 5.1 mmol/L 4.1 4.0 6.0(A)  Chloride 98 - 111 mmol/L 106 103 -  CO2 22 - 32 mmol/L 30 28 -  Calcium 8.9 - 10.3 mg/dL 9.6 9.3 -  Total Protein 6.5 - 8.1 g/dL 7.7 7.1 -  Total Bilirubin 0.3 - 1.2 mg/dL 0.9 0.7 -  Alkaline Phos 38 - 126 U/L 72 66 65  AST 15 - 41 U/L '22 26 28  ' ALT 0 - 44 U/L 32 38 31   03/01/18 CBC w/diff:    RADIOGRAPHIC STUDIES: I have personally reviewed the radiological images as listed and agreed with the findings in the report. No results found.  05/17/18 Left Knee Biopsy:    04/09/18 MRI Left Knee:     ASSESSMENT & PLAN:   78 y.o. male with  1. Ovoid lesion near the medial aspect of  of left knee ? Injection granuloma vs other etiology for  lesion. however given previous h/o melanoma and lymphoma will need to be cautious and try to wotrk this up.  04/09/18 MRI of Left Knee revealed An ovoid, lobulated, 80m  T2 hyperintense focus within the medial subcutaneous far 1.64m distal to the joint demonstrates demonstrates nonspecific imaging characteristics.   05/17/18 [place] Soft Tissue Bi[place] of the Left Knee did not identify any malignant cells and revealed inflammatory cells.  2. MGUS- IgM kappa monoclonal paraproteinemia 05/02/18 MMP revealed M Protein at 0.5 with immunofixation revealing IgM with kappa light chains  3/ h/o Melanoma and DLBCL  PLAN -Discussed pt labwork from 11/12/18; M Protein at 0.6g of IgM Kappa. Kappa light chains at 24.7 and a K:L ratio at 2.40. LDH normal at 150. Blood counts and chemistries are stable. -Discussed that at this point, it is not clear if the pt's small amount of M Protein is present because of a reactive process or a clonal process of either lymphocytes or plasma cells -Other blood counts remain normal, and blood chemistries remain normal -Discussed that at this point, the pt could choose to pursue further evaluation with a bone marrow biopsy vs continued watchful observation, however, if he did have a clonal process, it is likely indolent and slow growing and would not likely necessitate imminent treatment -Discussed that I cannot rule out a process like lymphoplasmacytic lymphoma, though his IgM could be reactive to recent infections and frequent herpetic cold sores -Pt prefers conservative approach with watchful observation -If the pt develops new unexplained fatigue, fevers, or chills, he will let me know in the interim and I will see him sooner -Continue follow up with dermatologist Dr. DWilhemina Bonitofor skin screening -Will see the pt back in 6 months, sooner if any new concerns   RTC with Dr KIrene Limbowith labs in 6 months. (plz schedule labs 1 week prior to clinic visit)   All of the patients  questions were answered with apparent satisfaction. The patient knows to call the clinic with any problems, questions or concerns.  The total time spent in the appt was 25 minutes and more than 50% was on counseling and direct patient cares.    GSullivan LoneMD MS AAHIVMS [facility] Health[facility]ematology/Oncology Physician [facility] (Office):       [phone](Work cell):  [phone](Fax):           [phone] 12/06/2018 2:07 PM  I, SBaldwin [place] am acting as a scribe for Dr. GSullivan Lone   .I have reviewed the above documentation for accuracy and completeness, and I agree with the above. .Brunetta GeneraMD

## 2018-12-06 ENCOUNTER — Inpatient Hospital Stay: Payer: PPO | Attending: Hematology | Admitting: Hematology

## 2018-12-06 ENCOUNTER — Telehealth: Payer: Self-pay | Admitting: Hematology

## 2018-12-06 VITALS — BP 142/84 | HR 84 | Temp 97.6°F | Resp 18 | Ht 72.0 in | Wt 191.1 lb

## 2018-12-06 DIAGNOSIS — Z85820 Personal history of malignant melanoma of skin: Secondary | ICD-10-CM

## 2018-12-06 DIAGNOSIS — D472 Monoclonal gammopathy: Secondary | ICD-10-CM | POA: Diagnosis not present

## 2018-12-06 DIAGNOSIS — L989 Disorder of the skin and subcutaneous tissue, unspecified: Secondary | ICD-10-CM

## 2018-12-06 NOTE — Telephone Encounter (Signed)
Scheduled appt per 2/20 los.  Declined calendar and avs, because he is my chart active.

## 2018-12-18 NOTE — Progress Notes (Signed)
[facility] Note  12/21/2018     CHIEF COMPLAINT Patient presents for Retina Follow Up   HISTORY OF PRESENT ILLNESS: Antonio [name] is a 78 y.o. male who presents to the clinic today for:   HPI    Retina Follow Up    Patient presents with  Dry AMD.  In both eyes.  This started months ago.  Severity is moderate.  Duration of 6 months.  Since onset it is stable.  I, the attending physician,  performed the HPI with the patient and updated documentation appropriately.          Comments    78 y/o male pt here for 6 mo f/u for dry ARMD OU.  No change in [place] OU.  Denies pain, flashes.  Has floaters OU.  Refresh prn OU.       Last edited by Bernarda Caffey, MD on 12/24/2018 11:30 AM. (History)    pt states his insurance company said they aren't going to pay for the Foresee machine anymore bc he wasn't using it enough, pt states they wanted him to use it at least 3 times a week, but pt was unable due to traveling  Referring physician: [name], DO [place], [place] [address]  HISTORICAL INFORMATION:   Selected notes from the [place] [facility] for ARMD OU; Moved from [place], [place]  Ocular Hx - nonexudative ARMD OU; PVD OU; pseudophakia OU (Toric OU - 12/2016 by Dr. Nance Pear, [place], [facility]);  PMH - HTN; [facility] - ARMD - Mother   CURRENT MEDICATIONS: No current outpatient medications on file. (Ophthalmic Drugs)   No current facility-administered medications for this visit.  (Ophthalmic Drugs)   Current Outpatient Medications (Other)  Medication Sig  . benzonatate (TESSALON) 200 MG capsule Take 200 mg by mouth 3 (three) times daily as needed for cough.  . Cholecalciferol (VITAMIN D3) 2000 units TABS Take 1 tablet by mouth daily.  Marland Kitchen losartan (COZAAR) 100 MG tablet TAKE ONE TABLET BY MOUTH ONE TIME DAILY   . meloxicam (MOBIC) 7.5 MG tablet TAKE ONE TABLET BY MOUTH TWICE DAILY   . montelukast (SINGULAIR) 10 MG tablet  Take 1 tablet (10 mg total) by mouth at bedtime.  . Multiple Vitamins-Minerals (PRESERVISION AREDS 2 PO) Take 2 capsules by mouth daily.  . simvastatin (ZOCOR) 40 MG tablet TAKE ONE TABLET BY MOUTH ONE TIME DAILY   . valACYclovir (VALTREX) 1000 MG tablet    No current facility-administered medications for this visit.  (Other)      REVIEW OF SYSTEMS: ROS    Positive for: Musculoskeletal, Eyes   Negative for: Constitutional, Gastrointestinal, Neurological, Skin, Genitourinary, HENT, Endocrine, Cardiovascular, Respiratory, Psychiatric, Allergic/Imm, Heme/Lymph   Last edited by Matthew Folks, COA on 12/21/2018  9:20 AM. (History)       ALLERGIES No Known Allergies  PAST MEDICAL HISTORY Past Medical History:  Diagnosis Date  . 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 BCC (basal cell carcinoma of skin)   . Carotid stenosis, asymptomatic, left   . Elevated diaphragm    right  . Erectile dysfunction   . Hyperlipidemia   . Hypertension   . Kidney stone   . 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 Macular degeneration    Dry ARMD OU  . Melanoma ([place])   . Melanoma ([place])   . Posterior vitreous detachment   . SCC (squamous cell carcinoma)   .  Status post joint replacement    right TKA   Past Surgical History:  Procedure Laterality Date  . CATARACT EXTRACTION Bilateral 01/2017   Dr. Merril Abbe [facility], [place])   . CATARACT EXTRACTION W/ INTRAOCULAR LENS IMPLANT Bilateral 2018  . EYE SURGERY    . MELANOMA EXCISION  10/1999  . TOTAL KNEE ARTHROPLASTY  06/2009    FAMILY HISTORY Family History  Problem Relation Age of Onset  . Cataracts Mother   . Transient ischemic attack Mother   . Asthma Mother   . Cataracts Father   . Diabetes Father        borderline  . Hypertension Father   . Hyperlipidemia Father   . Hyperlipidemia Son   . Hypertension Son   . Amblyopia Neg Hx   . Blindness Neg Hx   . Glaucoma Neg Hx   . Macular degeneration Neg Hx   .  Retinal detachment Neg Hx   . Strabismus Neg Hx   . Retinitis pigmentosa Neg Hx     SOCIAL HISTORY Social History   Tobacco Use  . Smoking status: Never Smoker  . Smokeless tobacco: Never Used  Substance Use Topics  . Alcohol use: Yes    Comment: 7 to 9 drinks per week  . Drug use: No         OPHTHALMIC EXAM:  Base Eye Exam    Visual Acuity (Snellen - Linear)      Right Left   Dist [place] 20/20 20/20       Tonometry (Tonopen, 9:24 AM)      Right Left   Pressure 13 14       Pupils      Dark Light Shape React APD   Right 3 2 Round Brisk None   Left 3 2 Round Brisk None       Visual Fields (Counting fingers)      Left Right    Full Full       Extraocular Movement      Right Left    Full, Ortho Full, Ortho       Neuro/Psych    Oriented x3:  Yes   Mood/Affect:  Normal       Dilation    Both eyes:  1.0% Mydriacyl, 2.5% Phenylephrine @ 9:24 AM        Slit Lamp and Fundus Exam    External Exam      Right Left   External Brow ptosis - mild Brow ptosis - mild       Slit Lamp Exam      Right Left   Lids/Lashes Dermatochalasis - upper lid Dermatochalasis - upper lid   Conjunctiva/Sclera White and quiet White and quiet   Cornea Arcus, 1+ Punctate epithelial erosions 1+ Punctate epithelial erosions   Anterior Chamber Deep and quiet Deep and quiet   Iris Round with moderate dilated to 60mm Round with moderate dilated to 39mm   Lens toric Posterior chamber intraocular lens in good postion with marks at 0300 and 0900, trace Posterior capsular opacification Toric Posterior chamber intraocular lens in good position with marks at 0200 and 0800; trace Posterior capsular opacification   Vitreous Vitreous syneresis, Posterior vitreous detachment Vitreous syneresis, Posterior vitreous detachment       Fundus Exam      Right Left   Disc Tilted with Peripapillary atrophy 360 compact, mild tilt, Peripapillary atrophy 360   C/D Ratio 0.4 0.5   Macula Flat, Blunted foveal  reflex, Drusen, RPE mottling, clumping  and atrophy Good foveal reflex, Drusen, RPE mottling and clumping   Vessels Vascular attenuation Vascular attenuation, mild Tortuousity   Periphery Attached; scattered Reticular degeneration Attached; scattered Reticular degeneration;           IMAGING AND PROCEDURES  Imaging and Procedures for 11/10/17  OCT, Retina - OU - Both Eyes       Right Eye Quality was good. Central Foveal Thickness: 324. Progression has been stable. Findings include normal foveal contour, no IRF, no SRF, retinal drusen , outer retinal atrophy (?Mild interval increase in focal areas of outer retinal atrophy / ellipsoid dropout; scattered drusen).   Left Eye Quality was good. Central Foveal Thickness: 326. Progression has been stable. Findings include normal foveal contour, no IRF, no SRF, retinal drusen  (Scattered drusen; ).   Notes Images captured and stored on drive  Diagnosis / Impression:  Nonexudative ARMD OU - no significant change from prior  Clinical management:  See below  Abbreviations: NFP - Normal foveal profile. CME - cystoid macular edema. PED - pigment epithelial detachment. IRF - intraretinal fluid. SRF - subretinal fluid. EZ - ellipsoid zone. ERM - epiretinal membrane. ORA - outer retinal atrophy. ORT - outer retinal tubulation. SRHM - subretinal hyper-reflective material                  ASSESSMENT/PLAN:    ICD-10-CM   1. Intermediate stage nonexudative age-related macular degeneration of both eyes H35.3132   2. Pseudophakia of both eyes Z96.1   3. 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 Bilateral posterior capsular opacification H26.493   5. Early dry stage nonexudative age-related macular degeneration of both eyes H35.3131   6. Retinal edema H35.81 OCT, Retina - OU - Both Eyes    1. Age related macular degeneration, non-exudative, both eyes  - The incidence, anatomy, and pathology of dry AMD, risk of progression, and the  AREDS and AREDS 2 study including smoking risks discussed with patient.  - has a Foresee AMD monitoring device at home - pt considering sending it back due to nonuse and letter from insurance [facility]) indicating denial due to nonuse over the last quarter.  - no significant progression on exam or OCT -- scattered drusen and ORA  - f/u 4 months, sooner/PRN  2. Pseudophakia OU  - s/p CE/PCIOL OU (Toric + femto) 12/2016 by Dr. Nance Pear in [facility]  - had post op IOP issue OS  - beautiful surgery, doing well  - monitor  3. H/o ocular hypertension OU  - IOP good today (13,14) off combigan  - monitor  4. PCO OU  - Discussed YAG Cap treatment for PCO;  - not yet visually significant  - monitor for now  Ophthalmic Meds Ordered this visit:  No orders of the defined types were placed in this encounter.      Return in about 4 months (around 04/22/2019) for f/u non-exu ARMD, DFE, OCT.  There are no Patient Instructions on file for this visit.   Explained the diagnoses, plan, and follow up with the patient and they expressed understanding.  Patient expressed understanding of the importance of proper follow up care.   This document serves as a record of services personally performed by Gardiner Sleeper, MD, PhD. It was created on their behalf by Ernest Mallick, OA, an ophthalmic assistant. The creation of this record is the provider's dictation and/or activities during the visit.    Electronically signed by: Ernest Mallick, OA  03.03.2020 11:31 AM  Gardiner Sleeper, M.D., Ph.D. Vitreoretinal Surgeon [facility]  I have reviewed the above documentation for accuracy and completeness, and I agree with the above. Gardiner Sleeper, M.D., Ph.D. 12/24/18 11:36 AM   Abbreviations: M myopia (nearsighted); A astigmatism; H hyperopia (farsighted); P presbyopia; Mrx spectacle prescription;  CTL contact lenses; OD right eye; OS left eye; OU both eyes  XT exotropia; ET esotropia; PEK  punctate epithelial keratitis; PEE punctate epithelial erosions; DES dry eye syndrome; MGD meibomian gland dysfunction; ATs artificial tears; PFAT's preservative free artificial tears; [place] nuclear sclerotic cataract; PSC posterior subcapsular cataract; ERM epi-retinal membrane; PVD posterior vitreous detachment; RD retinal detachment; DM diabetes mellitus; DR diabetic retinopathy; NPDR non-proliferative diabetic retinopathy; PDR proliferative diabetic retinopathy; CSME clinically significant macular edema; DME diabetic macular edema; dbh dot blot hemorrhages; CWS cotton wool spot; POAG primary open angle glaucoma; C/D cup-to-disc ratio; HVF humphrey visual field; GVF goldmann visual field; OCT optical coherence tomography; IOP intraocular pressure; BRVO Branch retinal vein occlusion; CRVO central retinal vein occlusion; CRAO central retinal artery occlusion; BRAO branch retinal artery occlusion; RT retinal tear; SB scleral buckle; PPV pars plana vitrectomy; VH Vitreous hemorrhage; PRP panretinal laser photocoagulation; IVK intravitreal kenalog; VMT vitreomacular traction; MH Macular hole;  NVD neovascularization of the disc; NVE neovascularization elsewhere; AREDS age related eye disease study; ARMD age related macular degeneration; POAG primary open angle glaucoma; EBMD epithelial/anterior basement membrane dystrophy; ACIOL anterior chamber intraocular lens; IOL intraocular lens; PCIOL posterior chamber intraocular lens; Phaco/IOL phacoemulsification with intraocular lens placement; [place] photorefractive keratectomy; LASIK laser assisted in situ keratomileusis; HTN hypertension; DM diabetes mellitus; COPD chronic obstructive pulmonary disease

## 2018-12-21 ENCOUNTER — Ambulatory Visit (INDEPENDENT_AMBULATORY_CARE_PROVIDER_SITE_OTHER): Payer: PPO | Admitting: Ophthalmology

## 2018-12-21 ENCOUNTER — Encounter (INDEPENDENT_AMBULATORY_CARE_PROVIDER_SITE_OTHER): Payer: Self-pay | Admitting: Ophthalmology

## 2018-12-21 DIAGNOSIS — Z961 Presence of intraocular lens: Secondary | ICD-10-CM

## 2018-12-21 DIAGNOSIS — H353132 Nonexudative age-related macular degeneration, bilateral, intermediate dry stage: Secondary | ICD-10-CM

## 2018-12-21 DIAGNOSIS — H26493 Other secondary cataract, bilateral: Secondary | ICD-10-CM

## 2018-12-21 DIAGNOSIS — H3581 Retinal edema: Secondary | ICD-10-CM | POA: Diagnosis not present

## 2018-12-21 DIAGNOSIS — H40053 Ocular hypertension, bilateral: Secondary | ICD-10-CM | POA: Diagnosis not present

## 2018-12-24 ENCOUNTER — Encounter (INDEPENDENT_AMBULATORY_CARE_PROVIDER_SITE_OTHER): Payer: Self-pay | Admitting: Ophthalmology

## 2018-12-25 DIAGNOSIS — Z85820 Personal history of malignant melanoma of skin: Secondary | ICD-10-CM | POA: Diagnosis not present

## 2018-12-25 DIAGNOSIS — L570 Actinic keratosis: Secondary | ICD-10-CM | POA: Diagnosis not present

## 2018-12-25 DIAGNOSIS — L821 Other seborrheic keratosis: Secondary | ICD-10-CM | POA: Diagnosis not present

## 2018-12-25 DIAGNOSIS — Z85828 Personal history of other malignant neoplasm of skin: Secondary | ICD-10-CM | POA: Diagnosis not present

## 2019-01-17 ENCOUNTER — Other Ambulatory Visit: Payer: Self-pay | Admitting: *Deleted

## 2019-01-17 MED ORDER — MONTELUKAST SODIUM 10 MG PO TABS: 10.0000 mg | ORAL_TABLET | Freq: Every day | ORAL | 0 refills | Status: DC

## 2019-01-17 MED ORDER — LOSARTAN POTASSIUM 100 MG PO TABS: 100.0000 mg | ORAL_TABLET | Freq: Every day | ORAL | 0 refills | Status: DC

## 2019-01-17 MED ORDER — SIMVASTATIN 40 MG PO TABS: 40.0000 mg | ORAL_TABLET | Freq: Every day | ORAL | 0 refills | Status: DC

## 2019-01-17 NOTE — Telephone Encounter (Signed)
Patient called and stated [place] and has ran out of his medications. Faxed.  Pended Meloxicam and sent to Dr. Lyndel Safe (Dr. Henrietta Hoover)  for approval due to [place] Warning.

## 2019-01-18 MED ORDER — MELOXICAM 7.5 MG PO TABS: 7.5000 mg | ORAL_TABLET | Freq: Two times a day (BID) | ORAL | 0 refills | Status: DC

## 2019-01-19 ENCOUNTER — Other Ambulatory Visit: Payer: Self-pay | Admitting: Internal Medicine

## 2019-02-10 ENCOUNTER — Other Ambulatory Visit: Payer: Self-pay | Admitting: Internal Medicine

## 2019-02-26 DIAGNOSIS — E119 Type 2 diabetes mellitus without complications: Secondary | ICD-10-CM | POA: Diagnosis not present

## 2019-02-26 DIAGNOSIS — E785 Hyperlipidemia, unspecified: Secondary | ICD-10-CM | POA: Diagnosis not present

## 2019-02-26 DIAGNOSIS — R7301 Impaired fasting glucose: Secondary | ICD-10-CM | POA: Diagnosis not present

## 2019-02-26 DIAGNOSIS — E7800 Pure hypercholesterolemia, unspecified: Secondary | ICD-10-CM | POA: Diagnosis not present

## 2019-02-26 LAB — LIPID PANEL
Cholesterol: 140 (ref 0–200)
HDL: 74 — AB (ref 35–70)
LDL Cholesterol: 53
Triglycerides: 67 (ref 40–160)

## 2019-02-26 LAB — HEMOGLOBIN A1C: Hemoglobin A1C: 5.6

## 2019-03-06 ENCOUNTER — Other Ambulatory Visit: Payer: Self-pay

## 2019-03-06 ENCOUNTER — Non-Acute Institutional Stay: Payer: PPO | Admitting: Internal Medicine

## 2019-03-06 ENCOUNTER — Encounter: Payer: Self-pay | Admitting: Internal Medicine

## 2019-03-06 VITALS — BP 128/68 | HR 82 | Temp 98.4°F | Ht 72.0 in | Wt 190.0 lb

## 2019-03-06 DIAGNOSIS — D472 Monoclonal gammopathy: Secondary | ICD-10-CM

## 2019-03-06 DIAGNOSIS — R7301 Impaired fasting glucose: Secondary | ICD-10-CM | POA: Diagnosis not present

## 2019-03-06 DIAGNOSIS — E559 Vitamin D deficiency, unspecified: Secondary | ICD-10-CM | POA: Diagnosis not present

## 2019-03-06 DIAGNOSIS — I10 Essential (primary) hypertension: Secondary | ICD-10-CM

## 2019-03-06 DIAGNOSIS — E7800 Pure hypercholesterolemia, unspecified: Secondary | ICD-10-CM

## 2019-03-06 DIAGNOSIS — Z8572 Personal history of non-Hodgkin lymphomas: Secondary | ICD-10-CM

## 2019-03-06 DIAGNOSIS — H6123 Impacted cerumen, bilateral: Secondary | ICD-10-CM | POA: Diagnosis not present

## 2019-03-06 DIAGNOSIS — Z0000 Encounter for general adult medical examination without abnormal findings: Secondary | ICD-10-CM | POA: Diagnosis not present

## 2019-03-06 NOTE — Progress Notes (Signed)
Provider:  Rexene Edison. Mariea Clonts, D.O., C.M.D. Location:  Occupational psychologist of Service:  Clinic (12)  Previous PCP: [name], DO Patient Care Team: [name], DO as PCP - General (Geriatric Medicine)  Extended Emergency Contact Information Primary Emergency Contact: Fulgham,[name] Address: [place] [address] [name] [place] Phone: [phone] Relation: Spouse  Goals of Care: Advanced Directive information Advanced Directives 03/06/2019  Does Patient Have a Medical Advance Directive? Yes  Type of Advance Directive [place]  Does patient want to make changes to medical advance directive? No - Patient declined  Copy of [place] in Chart? Yes - validated most recent copy scanned in chart (See row information)  Pre-existing out of facility DNR order (yellow form or pink MOST form) -      Chief Complaint  Patient presents with  . Annual Exam    CPE    HPI: Patient is a 78 y.o. [name] seen today for an annual physical exam.  Sneaks out to see that the fish are still living.   Got his first great grandson.  Has 3 great granddaughters.  Had a 90 person family get-together in December before this covid mess began.    If they go to the mountains, when they return, they must go into quarantine x 14 days or be tested.  He asks about hydroxychloroquine.    Saw Dr. Irene Limbo in Feb and sees him again in August.    He has macular degeneration and follows with Dr. Coralyn Pear.  Vision is doing well by his report.  He had a device he could use at home and take tests--then started getting billed--was supposed to only pay copay but he didn't have that understanding initially.  He has canceled it and sent the machine back now.    Does have aches and pains--varies day to day--says he does it to himself.  Had to hang a light and used a bunch of muscles.  He is doing chair 2 and walking some--not  religiously.  Does about 1.5 to 2 miles at a time.  He has lost 6 lbs based on our scales.    We reviewed his hba1c and flp.    Past Medical History:  Diagnosis Date  . 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 BCC (basal cell carcinoma of skin)   . Carotid stenosis, asymptomatic, left   . Elevated diaphragm    right  . Erectile dysfunction   . Hyperlipidemia   . Hypertension   . Kidney stone   . 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 Macular degeneration    Dry ARMD OU  . Melanoma ([place])   . Melanoma ([place])   . Posterior vitreous detachment   . SCC (squamous cell carcinoma)   . Status post joint replacement    right TKA   Past Surgical History:  Procedure Laterality Date  . CATARACT EXTRACTION Bilateral 01/2017   Dr. Merril Abbe [facility], [place])   . CATARACT EXTRACTION W/ INTRAOCULAR LENS IMPLANT Bilateral 2018  . EYE SURGERY    . MELANOMA EXCISION  10/1999  . TOTAL KNEE ARTHROPLASTY  06/2009    reports that he has never smoked. He has never used smokeless tobacco. He reports current alcohol use. He reports that he does not use drugs.  Functional Status Survey:    Family History  Problem Relation  Age of Onset  . Cataracts Mother   . Transient ischemic attack Mother   . Asthma Mother   . Cataracts Father   . Diabetes Father        borderline  . Hypertension Father   . Hyperlipidemia Father   . Hyperlipidemia Son   . Hypertension Son   . Amblyopia Neg Hx   . Blindness Neg Hx   . Glaucoma Neg Hx   . Macular degeneration Neg Hx   . Retinal detachment Neg Hx   . Strabismus Neg Hx   . Retinitis pigmentosa Neg Hx     Health Maintenance  Topic Date Due  . INFLUENZA VACCINE  05/18/2019  . TETANUS/TDAP  03/04/2024  . PNA vac Low Risk Adult  Completed    No Known Allergies  Outpatient Encounter Medications as of 03/06/2019  Medication Sig  . Cholecalciferol (VITAMIN D3) 2000 units TABS Take 1 tablet by mouth daily.  Marland Kitchen losartan (COZAAR) 100  MG tablet TAKE 1 TABLET BY MOUTH EVERY DAY  . meloxicam (MOBIC) 7.5 MG tablet Take 1 tablet (7.5 mg total) by mouth 2 (two) times daily.  . montelukast (SINGULAIR) 10 MG tablet Take 1 tablet (10 mg total) by mouth at bedtime.  . Multiple Vitamins-Minerals (PRESERVISION AREDS 2 PO) Take 2 capsules by mouth daily.  . simvastatin (ZOCOR) 40 MG tablet TAKE 1 TABLET BY MOUTH EVERY DAY  . [DISCONTINUED] valACYclovir (VALTREX) 1000 MG tablet   . [DISCONTINUED] benzonatate (TESSALON) 200 MG capsule Take 200 mg by mouth 3 (three) times daily as needed for cough.   No facility-administered encounter medications on file as of 03/06/2019.     Review of Systems  Constitutional: Negative for chills, fever and malaise/fatigue.  HENT: Positive for hearing loss.        Cannot wear mask and hearing aids together, also has cerumen impaction  Eyes: Negative for blurred vision.       Glasses, macular degeneration  Respiratory: Negative for cough and shortness of breath.   Cardiovascular: Negative for chest pain, palpitations and leg swelling.  Gastrointestinal: Negative for abdominal pain, blood in stool, constipation, diarrhea and melena.  Genitourinary: Negative for dysuria, frequency and urgency.       Typically only up 1-2 times at night to urinate  Skin: Negative for itching and rash.  Neurological: Negative for dizziness and loss of consciousness.  Endo/Heme/Allergies: Does not bruise/bleed easily.  Psychiatric/Behavioral: Negative for depression and memory loss. The patient is not nervous/anxious and does not have insomnia.     Vitals:   03/06/19 0953  BP: 128/68  Pulse: 82  Temp: 98.4 F (36.9 C)  TempSrc: Oral  SpO2: 97%  Weight: 190 lb (86.2 kg)  Height: 6' (1.829 m)   Body mass index is 25.77 kg/m. Physical Exam Constitutional:      Appearance: Normal appearance. He is normal weight.  HENT:     Head: Normocephalic and atraumatic.     Right Ear: External ear normal. There is  impacted cerumen.     Left Ear: External ear normal. There is impacted cerumen.     Ears:     Comments: Soft dark cerumen bilaterally, HOH    Nose:     Comments: Nose/mouth/throat deferred due to covid/mask-wearing Eyes:     Extraocular Movements: Extraocular movements intact.     Conjunctiva/sclera: Conjunctivae normal.     Pupils: Pupils are equal, round, and reactive to light.  Neck:     Musculoskeletal: Neck supple.  Cardiovascular:  Rate and Rhythm: Normal rate and regular rhythm.     Pulses: Normal pulses.     Heart sounds: Normal heart sounds.  Pulmonary:     Effort: Pulmonary effort is normal.     Breath sounds: Normal breath sounds.  Abdominal:     General: Bowel sounds are normal. There is no distension.     Palpations: Abdomen is soft. There is no mass.     Tenderness: There is no abdominal tenderness. There is no guarding or rebound.  Musculoskeletal: Normal range of motion.  Lymphadenopathy:     Cervical: No cervical adenopathy.  Skin:    General: Skin is warm and dry.     Capillary Refill: Capillary refill takes less than 2 seconds.  Neurological:     General: No focal deficit present.     Mental Status: He is alert and oriented to person, place, and time. Mental status is at baseline.     Cranial Nerves: No cranial nerve deficit.     Motor: No weakness.     Gait: Gait normal.     Comments: Right patellar reflex absent (TKA), left 2+  Psychiatric:        Mood and Affect: Mood normal.        Behavior: Behavior normal.        Thought Content: Thought content normal.        Judgment: Judgment normal.     Labs reviewed: Basic Metabolic Panel: Recent Labs    05/02/18 1423 11/12/18 1106  NA 138 143  K 4.0 4.1  CL 103 106  CO2 28 30  GLUCOSE 101* 101*  BUN 21 14  CREATININE 0.83 0.80  CALCIUM 9.3 9.6   Liver Function Tests: Recent Labs    05/02/18 1423 11/12/18 1106  AST 26 22  ALT 38 32  ALKPHOS 66 72  BILITOT 0.7 0.9  PROT 7.1 7.7    ALBUMIN 4.4 4.5   No results for input(s): LIPASE, AMYLASE in the last 8760 hours. No results for input(s): AMMONIA in the last 8760 hours. CBC: Recent Labs    05/02/18 1423 05/17/18 1109 11/12/18 1106  WBC 6.2 7.0 6.1  NEUTROABS 4.4  --  4.0  HGB 13.7 14.7 15.0  HCT 41.3 44.7 46.6  46.0  MCV 101.3* 101.8* 102.2*  PLT 184 219 220   Cardiac Enzymes: No results for input(s): CKTOTAL, CKMB, CKMBINDEX, TROPONINI in the last 8760 hours. 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 Annual physical exam -he is doing well, up to date on vaccines; will need AWV done by NP  2. Elevated fasting glucose -improved this time with more exercise--now in normal range -is avoiding starchy foods, but packaged meals amid covid come with desserts he's having to be careful with  3. Benign essential HTN -well controlled, no changes to regimen are needed  4. Monoclonal gammopathy of unknown significance (MGUS) -being followed by Dr. Kale/hematology -stable labs--sees him next in August  5. History of B-cell lymphoma -again, being followed by hematology, no new concerns  6. Pure hypercholesterolemia -f/u lab annually -at goal with excellent good cholesterol  7. Vitamin D  deficiency -cont vitamin D supplementation due to his "thin bones" noted on the screening bone density test (not a true DEXA--more of a heel scan type thing)  8.  B/l hearing loss due to cerumen impaction -will flush ears when tips for equipment available  Labs/tests ordered:  Hba1c, flp   F/u in 1 year for CPE with fasting labs before (other labs done by heme/onc)  Gurshaan Matsuoka L.  Tabor Bartram, D.O. [facility] [address], [place] [address] Cell Phone (Mon-Fri 8am-5pm):  [phone] On Call:  [phone] & follow prompts after 5pm & weekends Office Phone:  [phone] Office Fax:  [phone]

## 2019-03-06 NOTE — Patient Instructions (Signed)
Continue your exercise regimen. Be careful with the sweets in the packaged meals.   Continue your vitamin D due to the question about your bone density noted on your annual screening tests.

## 2019-03-17 ENCOUNTER — Other Ambulatory Visit: Payer: Self-pay | Admitting: Internal Medicine

## 2019-03-26 ENCOUNTER — Other Ambulatory Visit: Payer: Self-pay

## 2019-03-26 ENCOUNTER — Ambulatory Visit (INDEPENDENT_AMBULATORY_CARE_PROVIDER_SITE_OTHER): Payer: PPO | Admitting: Nurse Practitioner

## 2019-03-26 ENCOUNTER — Encounter: Payer: Self-pay | Admitting: Nurse Practitioner

## 2019-03-26 VITALS — BP 112/60 | HR 91 | Temp 98.1°F | Ht 72.0 in | Wt 190.0 lb

## 2019-03-26 DIAGNOSIS — G8911 Acute pain due to trauma: Secondary | ICD-10-CM | POA: Diagnosis not present

## 2019-03-26 DIAGNOSIS — M545 Low back pain: Secondary | ICD-10-CM | POA: Diagnosis not present

## 2019-03-26 MED ORDER — MELOXICAM 7.5 MG PO TABS: ORAL_TABLET | ORAL | 1 refills | Status: DC

## 2019-03-26 NOTE — Progress Notes (Signed)
Careteam: Patient Care Team: [name], DO as PCP - General (Geriatric Medicine)  Advanced Directive information    No Known Allergies  Chief Complaint  Patient presents with  . Acute Visit    [place] Thursday and injured right lower back ([facility] patient)      HPI: Patient is a 78 y.o. male seen in the office today due to pain after fall.  Leaving for his mtns tomorrow morning.  [place] and feel on some slippy rocks and hit is back. Aware of the discomfort but not painful currently.  He does have pain when he goes from sitting to standing.  Only painful when getting up. No pain with sitting or walking.  When he twist he notices it or leaning over.  Pain 5/10.  Yesterday was bothersome but was loading a truck and took a load to [place].  No dark urine or changes with urination.  No numbness or tingling to leg No loss of bowl or bladder No pain with deep breathes Has not had to take any additional for pain- taking meloxicam daily.   Review of Systems:  Review of Systems  Constitutional: Negative for chills, fever and malaise/fatigue.  Gastrointestinal: Negative for abdominal pain.  Genitourinary: Negative for hematuria.  Musculoskeletal: Positive for back pain, falls and myalgias.  Skin: Negative for itching and rash.    Past Medical History:  Diagnosis Date  . 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 BCC (basal cell carcinoma of skin)   . Carotid stenosis, asymptomatic, left   . Elevated diaphragm    right  . Erectile dysfunction   . Hyperlipidemia   . Hypertension   . Kidney stone   . 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 Macular degeneration    Dry ARMD OU  . Melanoma ([place])   . Melanoma ([place])   . Posterior vitreous detachment   . SCC (squamous cell carcinoma)   . Status post joint replacement    right TKA   Past Surgical History:  Procedure Laterality Date  . CATARACT EXTRACTION  Bilateral 01/2017   Dr. Merril Abbe [facility], [place])   . CATARACT EXTRACTION W/ INTRAOCULAR LENS IMPLANT Bilateral 2018  . EYE SURGERY    . MELANOMA EXCISION  10/1999  . TOTAL KNEE ARTHROPLASTY  06/2009   Social History:   reports that he has never smoked. He has never used smokeless tobacco. He reports current alcohol use. He reports that he does not use drugs.  Family History  Problem Relation Age of Onset  . Cataracts Mother   . Transient ischemic attack Mother   . Asthma Mother   . Cataracts Father   . Diabetes Father        borderline  . Hypertension Father   . Hyperlipidemia Father   . Hyperlipidemia Son   . Hypertension Son   . Amblyopia Neg Hx   . Blindness Neg Hx   . Glaucoma Neg Hx   . Macular degeneration Neg Hx   . Retinal detachment Neg Hx   . Strabismus Neg Hx   . Retinitis pigmentosa Neg Hx     Medications: Patient's Medications  New Prescriptions   No medications on file  Previous Medications   CHOLECALCIFEROL (VITAMIN D3) 2000 UNITS TABS    Take 1 tablet by mouth daily.   LOSARTAN (COZAAR) 100 MG TABLET    TAKE 1 TABLET BY MOUTH EVERY DAY  MELOXICAM (MOBIC) 7.5 MG TABLET    Take 7.5 mg by mouth daily.   MONTELUKAST (SINGULAIR) 10 MG TABLET    TAKE 1 TABLET BY MOUTH EVERYDAY AT BEDTIME   MULTIPLE VITAMINS-MINERALS (PRESERVISION AREDS 2 PO)    Take 2 capsules by mouth daily.   SIMVASTATIN (ZOCOR) 40 MG TABLET    TAKE 1 TABLET BY MOUTH EVERY DAY  Modified Medications   No medications on file  Discontinued Medications   MELOXICAM (MOBIC) 7.5 MG TABLET    Take 1 tablet (7.5 mg total) by mouth 2 (two) times daily.    Physical Exam:  Vitals:   03/26/19 0810  BP: 112/60  Pulse: 91  Temp: 98.1 F (36.7 C)  TempSrc: Oral  SpO2: 96%  Weight: 190 lb (86.2 kg)  Height: 6' (1.829 m)   Body mass index is 25.77 kg/m. Wt Readings from Last 3 Encounters:  03/26/19 190 lb (86.2 kg)  03/06/19 190 lb (86.2 kg)  12/06/18 191 lb 1.6 oz (86.7 kg)     Physical Exam Constitutional:      Appearance: Normal appearance.  HENT:     Head: Normocephalic and atraumatic.  Cardiovascular:     Rate and Rhythm: Normal rate and regular rhythm.  Pulmonary:     Effort: Pulmonary effort is normal.     Breath sounds: Normal breath sounds.  Abdominal:     General: Bowel sounds are normal.     Palpations: Abdomen is soft.  Musculoskeletal: Normal range of motion.       Back:     Comments: Tenderness noted on deep palpation; no bruising or swelling noted.   Skin:    General: Skin is warm and dry.  Neurological:     Mental Status: He is alert.     Labs reviewed: Basic Metabolic Panel: Recent Labs    05/02/18 1423 11/12/18 1106 11/12/18 1107  NA 138 143  --   K 4.0 4.1  --   CL 103 106  --   CO2 28 30  --   GLUCOSE 101* 101*  --   BUN 21 14  --   CREATININE 0.83 0.80  --   CALCIUM 9.3 9.6  --   TSH  --   --  1.569   Liver Function Tests: Recent Labs    05/02/18 1423 11/12/18 1106  AST 26 22  ALT 38 32  ALKPHOS 66 72  BILITOT 0.7 0.9  PROT 7.1 7.7  ALBUMIN 4.4 4.5   No results for input(s): LIPASE, AMYLASE in the last 8760 hours. No results for input(s): AMMONIA in the last 8760 hours. CBC: Recent Labs    05/02/18 1423 05/17/18 1109 11/12/18 1106  WBC 6.2 7.0 6.1  NEUTROABS 4.4  --  4.0  HGB 13.7 14.7 15.0  HCT 41.3 44.7 46.6  46.0  MCV 101.3* 101.8* 102.2*  PLT 184 219 220   Lipid Panel: Recent Labs    02/26/19  CHOL 140  HDL 74*  LDLCALC 53  TRIG 67   TSH: Recent Labs    11/12/18 1107  TSH 1.569   A1C: Lab Results  Component Value Date   HGBA1C 5.6 02/26/2019     Assessment/Plan 1. Acute low back pain due to trauma Pain with movement after he fell and hit rocks while fishing on a stream.  -encouraged to avoid heavy lifting at this time but to keep moving -can use heating pad twice daily, muscle rub after heat -request 90 day supply of mobic,  will increase to BID for 1 week then resume  daily dosing. - meloxicam (MOBIC) 7.5 MG tablet; Increase mobic to twice daily for 1 week then daily  Dispense: 100 tablet; Refill: 1 -to notify if symptoms worsen or fail to improve.   Antonio [name]. [place], [facility] [phone]

## 2019-03-26 NOTE — Patient Instructions (Signed)
Increase mobic to twice daily for 7 days then decrease to daily To use heating pad twice daily (would do routinely for the next 3-5 days) and can use muscle rub AFTER heat   To notify if symptoms worsen or fail to impove

## 2019-04-05 ENCOUNTER — Other Ambulatory Visit: Payer: Self-pay

## 2019-04-05 ENCOUNTER — Ambulatory Visit (INDEPENDENT_AMBULATORY_CARE_PROVIDER_SITE_OTHER): Payer: PPO | Admitting: Family

## 2019-04-05 ENCOUNTER — Encounter: Payer: Self-pay | Admitting: Family

## 2019-04-05 DIAGNOSIS — M545 Low back pain, unspecified: Secondary | ICD-10-CM

## 2019-04-05 DIAGNOSIS — K5901 Slow transit constipation: Secondary | ICD-10-CM

## 2019-04-05 NOTE — Progress Notes (Addendum)
Patient ID: Antonio [name], male   DOB: 18-Aug-1941, 78 y.o.   MRN: [id number] This service is provided via telemedicine  No vital signs collected/recorded due to the encounter was a telemedicine visit.   Location of patient (ex: home, work):  Home  Patient consents to a telephone visit:  Yes  Location of the provider (ex: office, home):  Office  Name of any referring provider:  N/A  Names of all persons participating in the telemedicine service and their role in the encounter:  [name], Marisa Cyphers RMA, Marlowe Sax NP  Time spent on call:  10 min  Provider: Dinah Ngetich FNP-C  [name], DO  Patient Care Team: [name], DO as PCP - General (Geriatric Medicine)  Extended Emergency Contact Information Primary Emergency Contact: Argabright,[name] Address: [place] [address] [place] Phone: [phone] Relation: Spouse  Code Status:  DNR Goals of care: Advanced Directive information Advanced Directives 03/06/2019  Does Patient Have a Medical Advance Directive? Yes  Type of Advance Directive [place]  Does patient want to make changes to medical advance directive? No - Patient declined  Copy of [place] in Chart? Yes - validated most recent copy scanned in chart (See row information)  Pre-existing out of facility DNR order (yellow form or pink MOST form) -     Chief Complaint  Patient presents with  . Acute Visit    Acute side and back pain    HPI:  Pt is a 78 y.o. male seen today for an acute visit for evaluation of side and back pain.He states was seen here at the office by J.Eubanks for right side/back pain after he fell fishing on a trout stream slipped on a rock and hit his right side of the back.He had pain with movement.He was encouraged to avoid lifting,use heating pad twice daily,muscle rub and to increase his mobic 7.5 mg tablet to twice daily x 1 week.he  states right side pain has improved. He complains of left side/back pain that is on and off.He states has no pain currently but knows pain will return later.pain was worst last night.He states has had similar pain in the past when he had travelled to [place] ended up being treated for a kidney stone.He has had some loss of appetite,nausea but no vomiting.No fever or chills.He has tried to increase his fluid intake but now has to urinate more frequent. No hematuria or urgency.   Also complains of no bowel movement for 2-3 days.he has used a suppository and took Doculax 2 tablets but has had no results.He denies any abdominal distension,tenderness or pain.  [place]        Past Medical History:  Diagnosis Date  . 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 BCC (basal cell carcinoma of skin)   . Carotid stenosis, asymptomatic, left   . Elevated diaphragm    right  . Erectile dysfunction   . Hyperlipidemia   . Hypertension   . Kidney stone   . 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 Macular degeneration    Dry ARMD OU  . Melanoma ([place])   . Melanoma ([place])   . Posterior vitreous detachment   . SCC (squamous cell carcinoma)   . Status post joint replacement    right TKA  Past Surgical History:  Procedure Laterality Date  . CATARACT EXTRACTION Bilateral 01/2017   Dr. Merril Abbe [facility], [place])   . CATARACT EXTRACTION W/ INTRAOCULAR LENS IMPLANT Bilateral 2018  . EYE SURGERY    . MELANOMA EXCISION  10/1999  . TOTAL KNEE ARTHROPLASTY  06/2009    No Known Allergies  Outpatient Encounter Medications as of 04/05/2019  Medication Sig  . Cholecalciferol (VITAMIN D3) 2000 units TABS Take 1 tablet by mouth daily.  Marland Kitchen losartan (COZAAR) 100 MG tablet TAKE 1 TABLET BY MOUTH EVERY DAY  . meloxicam (MOBIC) 7.5 MG tablet Increase mobic to twice daily for 1 week then daily  . Multiple Vitamins-Minerals (PRESERVISION AREDS 2 PO)  Take 2 capsules by mouth daily.  . simvastatin (ZOCOR) 40 MG tablet TAKE 1 TABLET BY MOUTH EVERY DAY  . [DISCONTINUED] montelukast (SINGULAIR) 10 MG tablet TAKE 1 TABLET BY MOUTH EVERYDAY AT BEDTIME   No facility-administered encounter medications on file as of 04/05/2019.     Review of Systems  Constitutional: Positive for appetite change. Negative for chills, fatigue and fever.  HENT: Negative for congestion, rhinorrhea, sinus pressure, sinus pain, sneezing and sore throat.   Respiratory: Negative for cough, chest tightness, shortness of breath and wheezing.   Cardiovascular: Negative for chest pain, palpitations and leg swelling.  Gastrointestinal: Positive for constipation and nausea. Negative for abdominal distention, abdominal pain and vomiting.  Genitourinary: Negative for difficulty urinating, dysuria, hematuria and urgency.  Musculoskeletal: Positive for back pain.    Immunization History  Administered Date(s) Administered  . Influenza, High Dose Seasonal PF 07/15/2017, 07/16/2018  . Pneumococcal Conjugate-13 03/04/2014  . Pneumococcal Polysaccharide-23 07/27/2010  . Tdap 03/04/2014  . Zoster 09/22/2006  . Zoster Recombinat (Shingrix) 10/06/2017, 12/09/2017   Pertinent  Health Maintenance Due  Topic Date Due  . INFLUENZA VACCINE  05/18/2019  . PNA vac Low Risk Adult  Completed   Fall Risk  04/05/2019 03/26/2019 03/06/2019 09/05/2018 02/28/2018  Falls in the past year? 1 1 0 0 No  Number falls in past yr: 0 0 0 0 -  Injury with Fall? 1 1 0 0 -  Comment - Slipped on rocks, injured left side of back  - - -    There were no vitals filed for this visit. There is no height or weight on file to calculate BMI. Physical Exam  unable to complete on Telephone visit.   Labs reviewed: Recent Labs    05/02/18 1423 11/12/18 1106  NA 138 143  K 4.0 4.1  CL 103 106  CO2 28 30  GLUCOSE 101* 101*  BUN 21 14  CREATININE 0.83 0.80  CALCIUM 9.3 9.6   Recent Labs     05/02/18 1423 11/12/18 1106  AST 26 22  ALT 38 32  ALKPHOS 66 72  BILITOT 0.7 0.9  PROT 7.1 7.7  ALBUMIN 4.4 4.5   Recent Labs    05/02/18 1423 05/17/18 1109 11/12/18 1106  WBC 6.2 7.0 6.1  NEUTROABS 4.4  --  4.0  HGB 13.7 14.7 15.0  HCT 41.3 44.7 46.6  46.0  MCV 101.3* 101.8* 102.2*  PLT 184 219 220   Lab Results  Component Value Date   TSH 1.569 11/12/2018   Lab Results  Component Value Date   HGBA1C 5.6 02/26/2019   Lab Results  Component Value Date   CHOL 140 02/26/2019   HDL 74 (A) 02/26/2019   LDLCALC 53 02/26/2019   TRIG 67 02/26/2019    Significant Diagnostic Results in  last 30 days:  No results found.  Assessment/Plan 1. Acute left-sided low back pain without sciatica Afebrile.intermittent pain unclear etiology with some nausea but without any vomiting Possible due to constipation ? Kidney stone verse UTI recommend evaluation at the nearest ED.[place]  2. Slow transit constipation No abdominal distention,tenderness or pain.Has taken Doculax and suppository without any relief. - Recommend using fleet enema or suppository or drink magnesium citrate for constipation.  - Increase fluid intake and fiber in the diet. - Notify provider if no bowel movement > 3 days.   Family/ staff Communication: Reviewed plan of care with patient.  Labs/tests ordered: None   Time spent with patient 16 minutes >50% time spent counseling; reviewing medical record; tests; labs; and developing future plan of care  Sandrea Hughs, NP

## 2019-04-05 NOTE — Patient Instructions (Addendum)
1. Get left side/back pain evaluated at the nearest ED hospital if pain worsen.  2. Recommend using fleet enema or suppository or drink magnesium citrate for constipation.  3. Increase fluid intake and fiber in the diet.    Constipation, Adult Constipation is when a person:  Poops (has a bowel movement) fewer times in a week than normal.  Has a hard time pooping.  Has poop that is dry, hard, or bigger than normal. Follow these instructions at home: Eating and drinking   Eat foods that have a lot of fiber, such as: ? Fresh fruits and vegetables. ? Whole grains. ? Beans.  Eat less of foods that are high in fat, low in fiber, or overly processed, such as: ? [place] fries. ? Hamburgers. ? Cookies. ? Candy. ? Soda.  Drink enough fluid to keep your pee (urine) clear or pale yellow. General instructions  Exercise regularly or as told by your doctor.  Go to the restroom when you feel like you need to poop. Do not hold it in.  Take over-the-counter and prescription medicines only as told by your doctor. These include any fiber supplements.  Do pelvic floor retraining exercises, such as: ? Doing deep breathing while relaxing your lower belly (abdomen). ? Relaxing your pelvic floor while pooping.  Watch your condition for any changes.  Keep all follow-up visits as told by your doctor. This is important. Contact a doctor if:  You have pain that gets worse.  You have a fever.  You have not pooped for 4 days.  You throw up (vomit).  You are not hungry.  You lose weight.  You are bleeding from the anus.  You have thin, pencil-like poop (stool). Get help right away if:  You have a fever, and your symptoms suddenly get worse.  You leak poop or have blood in your poop.  Your belly feels hard or bigger than normal (is bloated).  You have very bad belly pain.  You feel dizzy or you faint. This information is not intended to replace advice given to you by your  health care provider. Make sure you discuss any questions you have with your health care provider. Document Released: 03/21/2008 Document Revised: 04/22/2016 Document Reviewed: 03/23/2016 Elsevier Interactive Patient Education  2019 [employer].

## 2019-04-06 DIAGNOSIS — N132 Hydronephrosis with renal and ureteral calculous obstruction: Secondary | ICD-10-CM | POA: Diagnosis not present

## 2019-04-06 DIAGNOSIS — Z87442 Personal history of urinary calculi: Secondary | ICD-10-CM | POA: Diagnosis not present

## 2019-04-06 DIAGNOSIS — N3289 Other specified disorders of bladder: Secondary | ICD-10-CM | POA: Diagnosis not present

## 2019-04-06 DIAGNOSIS — R1012 Left upper quadrant pain: Secondary | ICD-10-CM | POA: Diagnosis not present

## 2019-04-06 DIAGNOSIS — N200 Calculus of kidney: Secondary | ICD-10-CM | POA: Diagnosis not present

## 2019-04-08 ENCOUNTER — Telehealth: Payer: Self-pay | Admitting: *Deleted

## 2019-04-08 DIAGNOSIS — N200 Calculus of kidney: Secondary | ICD-10-CM

## 2019-04-08 NOTE — Telephone Encounter (Signed)
Patient called and stated that he spoke with Dinah over the phone last week and she recommended patient to go to the ER. Patient stated that he did go to the [facility] ER and indeed it was a kidney problem. Stated they told him he had a Left kidney stone and needs to see a Urologist. Patient is requesting to see Dr. Jeffie Pollock at [facility] urology. Would like for you to place a referral. Please Advise.

## 2019-04-08 NOTE — Telephone Encounter (Signed)
May Refer to urology per ED recommendation for Kidney stone.

## 2019-04-09 NOTE — Telephone Encounter (Signed)
Referral placed.

## 2019-04-10 DIAGNOSIS — H5211 Myopia, right eye: Secondary | ICD-10-CM | POA: Diagnosis not present

## 2019-04-10 DIAGNOSIS — Z961 Presence of intraocular lens: Secondary | ICD-10-CM | POA: Diagnosis not present

## 2019-04-10 DIAGNOSIS — H5202 Hypermetropia, left eye: Secondary | ICD-10-CM | POA: Diagnosis not present

## 2019-04-10 DIAGNOSIS — H353130 Nonexudative age-related macular degeneration, bilateral, stage unspecified: Secondary | ICD-10-CM | POA: Diagnosis not present

## 2019-04-14 ENCOUNTER — Other Ambulatory Visit: Payer: Self-pay | Admitting: Internal Medicine

## 2019-04-18 ENCOUNTER — Other Ambulatory Visit: Payer: Self-pay | Admitting: Internal Medicine

## 2019-04-18 DIAGNOSIS — M545 Low back pain, unspecified: Secondary | ICD-10-CM

## 2019-04-18 DIAGNOSIS — G8911 Acute pain due to trauma: Secondary | ICD-10-CM

## 2019-04-21 NOTE — Progress Notes (Signed)
[facility] Note  04/22/2019     CHIEF COMPLAINT Patient presents for Retina Follow Up   HISTORY OF PRESENT ILLNESS: Antonio [name] is a 78 y.o. male who presents to the clinic today for:   HPI    Retina Follow Up    Patient presents with  Wet AMD.  This started 8 months ago.  Since onset it is stable.  I, the attending physician,  performed the HPI with the patient and updated documentation appropriately.          Comments    $ mos f/u NoN EXU ARMD OU. Patient states his vision is good, denies new visual issues/onsets. Pt is using ReFresh gtt's OU PRN.       Last edited by Bernarda Caffey, MD on 04/22/2019  9:40 AM. (History)    pt states he is doing well, pt states he has given the Foresee back to his insurance company due to not using it enough  Referring physician: Mariea Clonts, Tiffany L, DO [place],  [place] [address]  HISTORICAL INFORMATION:   Selected notes from the [facility] for ARMD OU; Moved from [place], [place]  Ocular Hx - nonexudative ARMD OU; PVD OU; pseudophakia OU (Toric OU - 12/2016 by Dr. Nance Pear, [place], [facility]);  PMH - HTN; [facility] - ARMD - Mother   CURRENT MEDICATIONS: No current outpatient medications on file. (Ophthalmic Drugs)   No current facility-administered medications for this visit.  (Ophthalmic Drugs)   Current Outpatient Medications (Other)  Medication Sig  . Cholecalciferol (VITAMIN D3) 2000 units TABS Take 1 tablet by mouth daily.  Marland Kitchen losartan (COZAAR) 100 MG tablet TAKE 1 TABLET BY MOUTH EVERY DAY  . meloxicam (MOBIC) 7.5 MG tablet Take 1 tablet (7.5 mg total) by mouth daily.  . Multiple Vitamins-Minerals (PRESERVISION AREDS 2 PO) Take 2 capsules by mouth daily.  . simvastatin (ZOCOR) 40 MG tablet TAKE 1 TABLET BY MOUTH EVERY DAY   No current facility-administered medications for this visit.  (Other)      REVIEW OF SYSTEMS: ROS    Negative for: Constitutional,  Gastrointestinal, Neurological, Skin, Genitourinary, Musculoskeletal, HENT, Endocrine, Cardiovascular, Respiratory, Psychiatric, Allergic/Imm, Heme/Lymph   Last edited by Zenovia Jordan, LPN on 05/17/174  1:02 AM. (History)       ALLERGIES No Known Allergies  PAST MEDICAL HISTORY Past Medical History:  Diagnosis Date  . 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 BCC (basal cell carcinoma of skin)   . Carotid stenosis, asymptomatic, left   . Elevated diaphragm    right  . Erectile dysfunction   . Hyperlipidemia   . Hypertension   . Kidney stone   . 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 Macular degeneration    Dry ARMD OU  . Melanoma ([place])   . Melanoma ([place])   . Posterior vitreous detachment   . SCC (squamous cell carcinoma)   . Status post joint replacement    right TKA   Past Surgical History:  Procedure Laterality Date  . CATARACT EXTRACTION Bilateral 01/2017   Dr. Merril Abbe [facility], [place])   . CATARACT EXTRACTION W/ INTRAOCULAR LENS IMPLANT Bilateral 2018  . EYE SURGERY    . MELANOMA EXCISION  10/1999  . TOTAL KNEE ARTHROPLASTY  06/2009    FAMILY HISTORY Family History  Problem Relation Age of Onset  . Cataracts Mother   . Transient ischemic attack Mother   .  Asthma Mother   . Cataracts Father   . Diabetes Father        borderline  . Hypertension Father   . Hyperlipidemia Father   . Hyperlipidemia Son   . Hypertension Son   . Amblyopia Neg Hx   . Blindness Neg Hx   . Glaucoma Neg Hx   . Macular degeneration Neg Hx   . Retinal detachment Neg Hx   . Strabismus Neg Hx   . Retinitis pigmentosa Neg Hx     SOCIAL HISTORY Social History   Tobacco Use  . Smoking status: Never Smoker  . Smokeless tobacco: Never Used  Substance Use Topics  . Alcohol use: Yes    Comment: 7 to 9 drinks per week  . Drug use: No         OPHTHALMIC EXAM:  Base Eye Exam    Visual Acuity (Snellen - Linear)      Right Left   Dist [place] 20/20 20/20  -1   Dist ph [place] NI NI       Tonometry (Tonopen, 8:59 AM)      Right Left   Pressure 12 12       Pupils      Dark Light Shape React APD   Right 3 2 Round Brisk None   Left 3 2 Round Brisk None       Visual Fields (Counting fingers)      Left Right    Full Full       Extraocular Movement      Right Left    Full, Ortho Full, Ortho       Neuro/Psych    Oriented x3: Yes   Mood/Affect: Normal       Dilation    Both eyes: 1.0% Mydriacyl, 2.5% Phenylephrine @ 9:02 AM        Slit Lamp and Fundus Exam    External Exam      Right Left   External Brow ptosis - mild Brow ptosis - mild       Slit Lamp Exam      Right Left   Lids/Lashes Dermatochalasis - upper lid Dermatochalasis - upper lid   Conjunctiva/Sclera mild inferior Conjunctivochalasis mild inferior Conjunctivochalasis, nasal Pinguecula   Cornea Arcus, trace Punctate epithelial erosions, Well healed temporal cataract wounds Arcus, Well healed cataract wounds   Anterior Chamber Deep and quiet Deep and quiet   Iris Round with moderate dilated to 77mm Round with moderate dilated to 28mm   Lens toric Posterior chamber intraocular lens in good postion with marks at 0300 and 0900, trace Posterior capsular opacification Toric Posterior chamber intraocular lens in good position with marks at 0200 and 0800; trace Posterior capsular opacification   Vitreous Vitreous syneresis, Posterior vitreous detachment Vitreous syneresis, Posterior vitreous detachment       Fundus Exam      Right Left   Disc Compact, Tilted with Peripapillary atrophy 360 compact, mild tilt, Peripapillary atrophy 360   C/D Ratio 0.4 0.5   Macula Flat, Blunted foveal reflex, Drusen, RPE mottling, clumping and atrophy - stable, No heme or edema Good foveal reflex, Drusen, RPE mottling and clumping - stable, No heme or edema   Vessels Vascular attenuation, mild Tortuousity Vascular attenuation, Tortuous, mild AV crossing changes   Periphery Attached;  scattered Reticular degeneration Attached; scattered Reticular degeneration          IMAGING AND PROCEDURES  Imaging and Procedures for 11/10/17  OCT, Retina - OU - Both Eyes  Right Eye Quality was good. Central Foveal Thickness: 319. Progression has been stable. Findings include normal foveal contour, no IRF, no SRF, retinal drusen , outer retinal atrophy ( Stable focal areas of outer retinal atrophy / ellipsoid dropout; scattered drusen).   Left Eye Quality was good. Central Foveal Thickness: 326. Progression has been stable. Findings include normal foveal contour, no IRF, no SRF, retinal drusen  (Scattered drusen).   Notes Images captured and stored on drive  Diagnosis / Impression:  Nonexudative ARMD OU - no significant change from prior  Clinical management:  See below  Abbreviations: NFP - Normal foveal profile. CME - cystoid macular edema. PED - pigment epithelial detachment. IRF - intraretinal fluid. SRF - subretinal fluid. EZ - ellipsoid zone. ERM - epiretinal membrane. ORA - outer retinal atrophy. ORT - outer retinal tubulation. SRHM - subretinal hyper-reflective material                  ASSESSMENT/PLAN:    ICD-10-CM   1. Intermediate stage nonexudative age-related macular degeneration of both eyes  H35.3132   2. Pseudophakia of both eyes  Z96.1   3. Ocular hypertension, bilateral  H40.053   4. Bilateral posterior capsular opacification  H26.493   5. Retinal edema  H35.81 OCT, Retina - OU - Both Eyes  6. Early dry stage nonexudative age-related macular degeneration of both eyes  H35.3131     1. Age related macular degeneration, non-exudative, both eyes  - The incidence, anatomy, and pathology of dry AMD, risk of progression, and the AREDS and AREDS 2 study including smoking risks discussed with patient.  - no longer using Foresee AMD monitoring device due to insurance issues  - no significant progression on exam or OCT -- scattered drusen and  ORA  - f/u 4-6 months, sooner/PRN  2. Pseudophakia OU  - s/p CE/PCIOL OU (Toric + femto) 12/2016 by Dr. Nance Pear in [facility]  - had post op IOP issue OS  - beautiful surgery, doing well  - monitor  3. H/o ocular hypertension OU  - IOP good today (12 OU) off combigan  - monitor   4. PCO OU  - Discussed YAG Cap treatment for PCO;  - not yet visually significant  - monitor for now  Ophthalmic Meds Ordered this visit:  No orders of the defined types were placed in this encounter.      Return for 4-6 mos -- f/u non-exu ARMD OU, DFE, OCT.  There are no Patient Instructions on file for this visit.   Explained the diagnoses, plan, and follow up with the patient and they expressed understanding.  Patient expressed understanding of the importance of proper follow up care.   This document serves as a record of services personally performed by Gardiner Sleeper, MD, PhD. It was created on their behalf by Ernest Mallick, OA, an ophthalmic assistant. The creation of this record is the provider's dictation and/or activities during the visit.    Electronically signed by: Ernest Mallick, OA  07.06.2020 9:53 AM   Gardiner Sleeper, M.D., Ph.D. Vitreoretinal Surgeon [facility]  I have reviewed the above documentation for accuracy and completeness, and I agree with the above. Gardiner Sleeper, M.D., Ph.D. 04/22/19 9:54 AM   Abbreviations: M myopia (nearsighted); A astigmatism; H hyperopia (farsighted); P presbyopia; Mrx spectacle prescription;  CTL contact lenses; OD right eye; OS left eye; OU both eyes  XT exotropia; ET esotropia; PEK punctate epithelial keratitis; PEE punctate epithelial erosions;  DES dry eye syndrome; MGD meibomian gland dysfunction; ATs artificial tears; PFAT's preservative free artificial tears; [place] nuclear sclerotic cataract; PSC posterior subcapsular cataract; ERM epi-retinal membrane; PVD posterior vitreous detachment; RD retinal detachment; DM diabetes  mellitus; DR diabetic retinopathy; NPDR non-proliferative diabetic retinopathy; PDR proliferative diabetic retinopathy; CSME clinically significant macular edema; DME diabetic macular edema; dbh dot blot hemorrhages; CWS cotton wool spot; POAG primary open angle glaucoma; C/D cup-to-disc ratio; HVF humphrey visual field; GVF goldmann visual field; OCT optical coherence tomography; IOP intraocular pressure; BRVO Branch retinal vein occlusion; CRVO central retinal vein occlusion; CRAO central retinal artery occlusion; BRAO branch retinal artery occlusion; RT retinal tear; SB scleral buckle; PPV pars plana vitrectomy; VH Vitreous hemorrhage; PRP panretinal laser photocoagulation; IVK intravitreal kenalog; VMT vitreomacular traction; MH Macular hole;  NVD neovascularization of the disc; NVE neovascularization elsewhere; AREDS age related eye disease study; ARMD age related macular degeneration; POAG primary open angle glaucoma; EBMD epithelial/anterior basement membrane dystrophy; ACIOL anterior chamber intraocular lens; IOL intraocular lens; PCIOL posterior chamber intraocular lens; Phaco/IOL phacoemulsification with intraocular lens placement; [place] photorefractive keratectomy; LASIK laser assisted in situ keratomileusis; HTN hypertension; DM diabetes mellitus; COPD chronic obstructive pulmonary disease

## 2019-04-22 ENCOUNTER — Ambulatory Visit (INDEPENDENT_AMBULATORY_CARE_PROVIDER_SITE_OTHER): Payer: PPO | Admitting: Ophthalmology

## 2019-04-22 ENCOUNTER — Other Ambulatory Visit: Payer: Self-pay

## 2019-04-22 ENCOUNTER — Encounter (INDEPENDENT_AMBULATORY_CARE_PROVIDER_SITE_OTHER): Payer: Self-pay | Admitting: Ophthalmology

## 2019-04-22 DIAGNOSIS — Z961 Presence of intraocular lens: Secondary | ICD-10-CM | POA: Diagnosis not present

## 2019-04-22 DIAGNOSIS — H26493 Other secondary cataract, bilateral: Secondary | ICD-10-CM

## 2019-04-22 DIAGNOSIS — H3581 Retinal edema: Secondary | ICD-10-CM

## 2019-04-22 DIAGNOSIS — H353132 Nonexudative age-related macular degeneration, bilateral, intermediate dry stage: Secondary | ICD-10-CM | POA: Diagnosis not present

## 2019-04-22 DIAGNOSIS — H353131 Nonexudative age-related macular degeneration, bilateral, early dry stage: Secondary | ICD-10-CM | POA: Diagnosis not present

## 2019-04-22 DIAGNOSIS — H40053 Ocular hypertension, bilateral: Secondary | ICD-10-CM | POA: Diagnosis not present

## 2019-04-23 ENCOUNTER — Other Ambulatory Visit: Payer: Self-pay | Admitting: Internal Medicine

## 2019-04-23 ENCOUNTER — Other Ambulatory Visit: Payer: Self-pay | Admitting: *Deleted

## 2019-04-23 DIAGNOSIS — Z85820 Personal history of malignant melanoma of skin: Secondary | ICD-10-CM | POA: Diagnosis not present

## 2019-04-23 DIAGNOSIS — L570 Actinic keratosis: Secondary | ICD-10-CM | POA: Diagnosis not present

## 2019-04-23 DIAGNOSIS — Z85828 Personal history of other malignant neoplasm of skin: Secondary | ICD-10-CM | POA: Diagnosis not present

## 2019-04-23 MED ORDER — LOSARTAN POTASSIUM 100 MG PO TABS: 100.0000 mg | ORAL_TABLET | Freq: Every day | ORAL | 1 refills | Status: DC

## 2019-04-23 NOTE — Telephone Encounter (Signed)
Patient wife requested 90 supply to be sent to [facility]

## 2019-05-16 ENCOUNTER — Other Ambulatory Visit: Payer: Self-pay | Admitting: Internal Medicine

## 2019-05-17 ENCOUNTER — Telehealth: Payer: Self-pay

## 2019-05-17 MED ORDER — DIPHENOXYLATE-ATROPINE 2.5-0.025 MG PO TABS: 2.0000 | ORAL_TABLET | Freq: Four times a day (QID) | ORAL | 0 refills | Status: DC | PRN

## 2019-05-17 NOTE — Telephone Encounter (Signed)
I sent in 30 tablets of lomotil for him.  If his diarrhea does not improve in the next 24-48h, he has fever or chills, or develops other new worrisome symptoms, he should contact us or seek medical care locally.

## 2019-05-17 NOTE — Telephone Encounter (Signed)
Patient called requesting refill on Lomotil. Patient states he has not had to use this medication in a long time yet he is in need of it now.   [place] like rx sent to [facility]  Patient aware medication is not on his current or historical medication list  Please advise

## 2019-05-17 NOTE — Telephone Encounter (Signed)
Left detailed message on voicemail with Dr.Reed's response.

## 2019-06-04 ENCOUNTER — Other Ambulatory Visit: Payer: Self-pay

## 2019-06-04 ENCOUNTER — Inpatient Hospital Stay: Payer: PPO | Attending: Hematology

## 2019-06-04 DIAGNOSIS — D472 Monoclonal gammopathy: Secondary | ICD-10-CM | POA: Insufficient documentation

## 2019-06-04 DIAGNOSIS — C8330 Diffuse large B-cell lymphoma, unspecified site: Secondary | ICD-10-CM | POA: Diagnosis not present

## 2019-06-04 DIAGNOSIS — L989 Disorder of the skin and subcutaneous tissue, unspecified: Secondary | ICD-10-CM | POA: Insufficient documentation

## 2019-06-04 LAB — CMP (CANCER CENTER ONLY)
ALT: 31 U/L (ref 0–44)
AST: 23 U/L (ref 15–41)
Albumin: 3.9 g/dL (ref 3.5–5.0)
Alkaline Phosphatase: 63 U/L (ref 38–126)
Anion gap: 10 (ref 5–15)
BUN: 24 mg/dL — ABNORMAL HIGH (ref 8–23)
CO2: 24 mmol/L (ref 22–32)
Calcium: 9.1 mg/dL (ref 8.9–10.3)
Chloride: 108 mmol/L (ref 98–111)
Creatinine: 0.8 mg/dL (ref 0.61–1.24)
GFR, Est AFR Am: >60 mL/min (ref >60)
GFR, Estimated: >60 mL/min (ref >60)
Glucose, Bld: 85 mg/dL (ref 70–99)
Potassium: 4.7 mmol/L (ref 3.5–5.1)
Sodium: 142 mmol/L (ref 135–145)
Total Bilirubin: 0.6 mg/dL (ref 0.3–1.2)
Total Protein: 6.6 g/dL (ref 6.5–8.1)

## 2019-06-04 LAB — CBC WITH DIFFERENTIAL/PLATELET
Abs Immature Granulocytes: 0.03 10*3/uL (ref 0.00–0.07)
Basophils Absolute: 0 10*3/uL (ref 0.0–0.1)
Basophils Relative: 1 %
Eosinophils Absolute: 0.1 10*3/uL (ref 0.0–0.5)
Eosinophils Relative: 1 %
HCT: 39.2 % (ref 39.0–52.0)
Hemoglobin: 12.8 g/dL — ABNORMAL LOW (ref 13.0–17.0)
Immature Granulocytes: 1 %
Lymphocytes Relative: 21 %
Lymphs Abs: 1.3 10*3/uL (ref 0.7–4.0)
MCH: 33 pg (ref 26.0–34.0)
MCHC: 32.7 g/dL (ref 30.0–36.0)
MCV: 101 fL — ABNORMAL HIGH (ref 80.0–100.0)
Monocytes Absolute: 0.6 10*3/uL (ref 0.1–1.0)
Monocytes Relative: 10 %
Neutro Abs: 4.2 10*3/uL (ref 1.7–7.7)
Neutrophils Relative %: 66 %
Platelets: 200 10*3/uL (ref 150–400)
RBC: 3.88 MIL/uL — ABNORMAL LOW (ref 4.22–5.81)
RDW: 13 % (ref 11.5–15.5)
WBC: 6.2 10*3/uL (ref 4.0–10.5)
nRBC: 0 % (ref 0.0–0.2)

## 2019-06-06 LAB — MULTIPLE MYELOMA PANEL, SERUM
Albumin SerPl Elph-Mcnc: 3.8 g/dL (ref 2.9–4.4)
Albumin/Glob SerPl: 1.6 (ref 0.7–1.7)
Alpha 1: 0.2 g/dL (ref 0.0–0.4)
Alpha2 Glob SerPl Elph-Mcnc: 0.6 g/dL (ref 0.4–1.0)
B-Globulin SerPl Elph-Mcnc: 0.6 g/dL — ABNORMAL LOW (ref 0.7–1.3)
Gamma Glob SerPl Elph-Mcnc: 1.1 g/dL (ref 0.4–1.8)
Globulin, Total: 2.4 g/dL (ref 2.2–3.9)
IgA: 91 mg/dL (ref 61–437)
IgG (Immunoglobin G), Serum: 629 mg/dL (ref 603–1613)
IgM (Immunoglobulin M), Srm: 730 mg/dL — ABNORMAL HIGH (ref 15–143)
M Protein SerPl Elph-Mcnc: 0.5 g/dL — ABNORMAL HIGH
Total Protein ELP: 6.2 g/dL (ref 6.0–8.5)

## 2019-06-11 ENCOUNTER — Other Ambulatory Visit: Payer: Self-pay

## 2019-06-11 ENCOUNTER — Inpatient Hospital Stay (HOSPITAL_BASED_OUTPATIENT_CLINIC_OR_DEPARTMENT_OTHER): Payer: PPO | Admitting: Hematology

## 2019-06-11 ENCOUNTER — Telehealth: Payer: Self-pay | Admitting: Hematology

## 2019-06-11 VITALS — BP 121/73 | HR 83 | Temp 98.0°F | Resp 18 | Ht 72.0 in | Wt 188.3 lb

## 2019-06-11 DIAGNOSIS — D472 Monoclonal gammopathy: Secondary | ICD-10-CM | POA: Diagnosis not present

## 2019-06-11 DIAGNOSIS — D7589 Other specified diseases of blood and blood-forming organs: Secondary | ICD-10-CM | POA: Diagnosis not present

## 2019-06-11 NOTE — Telephone Encounter (Signed)
Scheduled appt per 8/25 los. ° °Printed calendar and avs. °

## 2019-06-11 NOTE — Progress Notes (Signed)
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 male who has been referred to [place] by Dr. Hollace Kinnier for evaluation and management of Bone lesion of left lower leg. The pt reports that he is doing well overall.   The pt reports that he cannot feel the ovoid finding from his 04/09/18 MRI as noted below. He denies being able to feel anything in his skin as well. He notes that he received a needle injection in his knee prior to this MRI. The pt notes that his knee has lost much cartilage and is considering a knee replacement. He denies any recent injuries to his knee or falls. He has contacted Dr. Alphonsa Overall in surgery and another surgeon as well and will be making an appointment.   The pt notes that he does not feel any differently recently as compared to 6 months to a year ago. He does note that he normally has constipation in the spring, and has continued to have mild constipation. He denies any other concerns or symptoms.   The pt previously saw my colleague Dr. Lurline Del for primary testicular B-cell lymphoma in 2000. The pt notes that the involvement was limited to only one testicle. He received 6 intrathecal treatments in his spine, and received 4 cycles of chemotherapy (thinks this was CHOP) and radiation as well. The pt was followed by an oncologist for several years at the [facility] in [place] after moving from [place] after his [place] treatment.   The pt notes that he was first diagnosed with melanoma in October 2000. He noticed a strange spot on his left leg that was surgically resected. The pt's personal notes report a 2.9cm thick, level 4, no LN involvement, high risk stage II, treated with  Interferon three times a week for one year. He notes some squamous cell and basal cell involvements as well and sees a dermatologist, Dr. Wilhemina Bonito, regularly.   Of note prior to the patient's visit today, pt has had MRI Left Knee completed on 04/09/18 with results revealing An ovoid, lobulated, 46m T2 hyperintense focus within the medial subcutaneous far 1.925mdistal to the joint demonstrates demonstrates nonspecific imaging characteristics.   Most recent lab results (03/01/18) of CBC w/diff is as follows: all values are WNL except for RBC at 4.54, MCV at 100.4, MCH at 33.8.   On review of systems, pt reports left knee pain, mild constipation, and denies new or concerning symptoms, noticing any new lumps or bumps, pain along the spine, abdominal pains, problems passing urine, testicular pain/swelling, and any other symptoms.   Interval History:   WiCHRISTAIN NIZNIKeturns today for management and evaluation of his MGUS. The patient's last visit with [place] on 12/06/2018.The pt reports that he is doing well overall.  The pt reports no new concerns at this time or any changes in the last 6 months. Pt has been having a little trouble sleeping lately. Pt takes Vitamin D and I-Caps. Pt is staying active by cutting grass.  Lab results (06/04/19) of CBC w/diff and CMP is as follows: all values are WNL except for RBC at 3.88, Hgb at 12.8, MCV at 101.0, BUN at 24.  06/04/2019 MMP all values are  WNL except for IgM Srm at 730, B-Globulin SerPl Elph-Mcnc at 0.6, M Protein SerPl Elph-Mcnc at 0.5.   On review of systems, pt reports problems sleeping denies any symptoms of infection, abdominal pain and any other symptoms.   MEDICAL HISTORY:  Past Medical History:  Diagnosis Date   B-cell lymphoma ([place])    testicular diffuse large b-cell lymphoma   BCC (basal cell carcinoma of skin)    Carotid stenosis, asymptomatic, left    Elevated diaphragm    right   Erectile dysfunction    Hyperlipidemia     Hypertension    Kidney stone    Lymphoma of testis ([facility])    Macular degeneration    Dry ARMD OU   Melanoma ([place])    Melanoma ([place])    Posterior vitreous detachment    SCC (squamous cell carcinoma)    Status post joint replacement    right TKA    SURGICAL HISTORY: Past Surgical History:  Procedure Laterality Date   CATARACT EXTRACTION Bilateral 01/2017   Dr. Merril Abbe [facility])    CATARACT EXTRACTION W/ INTRAOCULAR LENS IMPLANT Bilateral 2018   EYE SURGERY     MELANOMA EXCISION  10/1999   TOTAL KNEE ARTHROPLASTY  06/2009    SOCIAL HISTORY: Social History   Socioeconomic History   Marital status: Married    Spouse name: Not on file   Number of children: Not on file   Years of education: Not on file   Highest education level: Not on file  Occupational History   Not on file  Social Needs   Financial resource strain: Not hard at all   Food insecurity    Worry: Never true    Inability: Never true   Transportation needs    Medical: No    Non-medical: No  Tobacco Use   Smoking status: Never Smoker   Smokeless tobacco: Never Used  Substance and Sexual Activity   Alcohol use: Yes    Comment: 7 to 9 drinks per week   Drug use: No   Sexual activity: Not Currently  Lifestyle   Physical activity    Days per week: 2 days    Minutes per session: 20 min   Stress: To some extent  Relationships   Social connections    Talks on phone: More than three times a week    Gets together: More than three times a week    Attends religious service: More than 4 times per year    Active member of club or organization: Yes    Attends meetings of clubs or organizations: More than 4 times per year    Relationship status: Married   Intimate partner violence    Fear of current or ex partner: No    Emotionally abused: No    Physically abused: No    Forced sexual activity: No  Other Topics Concern   Not on file  Social History Narrative   Social  History      Diet?good      Do you drink/eat things with caffeine? seldom      Marital status?          yes                          What year were you married? 1962      Do you live in a house, apartment, assisted living, condo, trailer, etc.? house      Is it one or  more stories?1      How many persons live in your home? 2      Do you have any pets in your home? (please list) no      Highest level of education completed? college      Current or past profession: president and [employer]      Do you exercise?   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 yes      Do you have a DNR form?       yes                            If not, do you want to discuss one?      Do you have signed POA/HPOA for forms?  yes      Functional Status      Do you have difficulty bathing or dressing yourself? no      Do you have difficulty preparing food or eating? no      Do you have difficulty managing your medications? no      Do you have difficulty managing your finances? no      Do you have difficulty affording your medications? no    FAMILY HISTORY: Family History  Problem Relation Age of Onset   Cataracts Mother    Transient ischemic attack Mother    Asthma Mother    Cataracts Father    Diabetes Father        borderline   Hypertension Father    Hyperlipidemia Father    Hyperlipidemia Son    Hypertension Son    Amblyopia Neg Hx    Blindness Neg Hx    Glaucoma Neg Hx    Macular degeneration Neg Hx    Retinal detachment Neg Hx    Strabismus Neg Hx    Retinitis pigmentosa Neg Hx     ALLERGIES:  has No Known Allergies.  MEDICATIONS:  Current Outpatient Medications  Medication Sig Dispense Refill   Cholecalciferol (VITAMIN D3) 2000 units TABS Take 1 tablet by mouth daily.     diphenoxylate-atropine (LOMOTIL) 2.5-0.025 MG tablet Take 2 tablets by mouth 4  (four) times daily as needed for diarrhea or loose stools. 30 tablet 0   losartan (COZAAR) 100 MG tablet Take 1 tablet (100 mg total) by mouth daily. 90 tablet 1   meloxicam (MOBIC) 7.5 MG tablet Take 1 tablet (7.5 mg total) by mouth daily. 30 tablet 0   Multiple Vitamins-Minerals (PRESERVISION AREDS 2 PO) Take 2 capsules by mouth daily.     simvastatin (ZOCOR) 40 MG tablet TAKE 1 TABLET BY MOUTH EVERY DAY 30 tablet 0   tamsulosin (FLOMAX) 0.4 MG CAPS capsule Take 0.4 mg by mouth daily.     No current facility-administered medications for this visit.     REVIEW OF SYSTEMS:    A 10+ POINT REVIEW OF SYSTEMS WAS OBTAINED including neurology, dermatology, psychiatry, cardiac, respiratory, lymph, extremities, GI, GU, Musculoskeletal, constitutional, breasts, reproductive, HEENT.  All pertinent positives are noted in the HPI.  All others are negative.   PHYSICAL EXAMINATION: ECOG PERFORMANCE STATUS: 0 - Asymptomatic VS reviewed -  stable  GENERAL:alert, in no acute distress and comfortable SKIN: no acute rashes, no significant lesions EYES: conjunctiva are pink and non-injected, sclera anicteric OROPHARYNX: MMM, no exudates, no oropharyngeal erythema or ulceration NECK: supple, no JVD LYMPH:  no palpable lymphadenopathy in the cervical, axillary or inguinal regions LUNGS: clear to auscultation b/l with normal respiratory effort HEART: regular rate & rhythm ABDOMEN:  normoactive bowel sounds , non tender, not distended. No palpable hepatosplenomegaly. Extremity: no pedal edema PSYCH: alert & oriented x 3 with fluent speech NEURO: no focal motor/sensory deficits  LABORATORY DATA:  I have reviewed the data as listed  . CBC Latest Ref Rng & Units 06/04/2019 11/12/2018 11/12/2018  WBC 4.0 - 10.5 K/uL 6.2 6.1 -  Hemoglobin 13.0 - 17.0 g/dL 12.8(L) 15.0 -  Hematocrit 39.0 - 52.0 % 39.2 46.6 46.0  Platelets 150 - 400 K/uL 200 220 -   . CBC    Component Value Date/Time   WBC 6.2  06/04/2019 1100   RBC 3.88 (L) 06/04/2019 1100   HGB 12.8 (L) 06/04/2019 1100   HCT 39.2 06/04/2019 1100   HCT 46.0 11/12/2018 1106   PLT 200 06/04/2019 1100   MCV 101.0 (H) 06/04/2019 1100   MCH 33.0 06/04/2019 1100   MCHC 32.7 06/04/2019 1100   RDW 13.0 06/04/2019 1100   LYMPHSABS 1.3 06/04/2019 1100   MONOABS 0.6 06/04/2019 1100   EOSABS 0.1 06/04/2019 1100   BASOSABS 0.0 06/04/2019 1100    . CMP Latest Ref Rng & Units 06/04/2019 11/12/2018 05/02/2018  Glucose 70 - 99 mg/dL 85 101(H) 101(H)  BUN 8 - 23 mg/dL 24(H) 14 21  Creatinine 0.61 - 1.24 mg/dL 0.80 0.80 0.83  Sodium 135 - 145 mmol/L 142 143 138  Potassium 3.5 - 5.1 mmol/L 4.7 4.1 4.0  Chloride 98 - 111 mmol/L 108 106 103  CO2 22 - 32 mmol/L '24 30 28  ' Calcium 8.9 - 10.3 mg/dL 9.1 9.6 9.3  Total Protein 6.5 - 8.1 g/dL 6.6 7.7 7.1  Total Bilirubin 0.3 - 1.2 mg/dL 0.6 0.9 0.7  Alkaline Phos 38 - 126 U/L 63 72 66  AST 15 - 41 U/L '23 22 26  ' ALT 0 - 44 U/L 31 32 38   03/01/18 CBC w/diff:    RADIOGRAPHIC STUDIES: I have personally reviewed the radiological images as listed and agreed with the findings in the report. No results found.  05/17/18 Left Knee Biopsy:    04/09/18 MRI Left Knee:     ASSESSMENT & PLAN:   78 y.o. male with  1. Ovoid lesion near the medial aspect of  of left knee ? Injection granuloma vs other etiology for lesion. however given previous h/o melanoma and lymphoma will need to be cautious and try to wotrk this up.  04/09/18 MRI of Left Knee revealed An ovoid, lobulated, 72m T2 hyperintense focus within the medial subcutaneous far 1.975mdistal to the joint demonstrates demonstrates nonspecific imaging characteristics.   05/17/18 [place] Soft Tissue Biopsy of the Left Knee did not identify any malignant cells and revealed inflammatory cells.  2. MGUS- IgM kappa monoclonal paraproteinemia 05/02/18 MMP revealed M Protein at 0.5 with immunofixation revealing IgM with kappa light chains  3/ h/o  Melanoma and DLBCL  PLAN  -Discussed pt labwork, 06/04/19; all values are WNL except for RBC at 3.88, Hgb at 12.8, MCV at 101.0, BUN at 24.  -Discussed 06/04/2019 MMP all values are WNL except for IgM Srm at 730, B-Globulin SerPl Elph-Mcnc at  0.6, M Protein SerPl Elph-Mcnc at 0.5.  -Discussed further testing (PET Scan and bone marrow biopsy) vs monitoring with labs -Discussed monitoring labs every 6 months to 1 year. Pt prefers conservative approach with watchful observation -Recommend taking a multi-vitamin or a Vitamin B-complex (preferred).  -Discussed that I cannot rule out a process like lymphoplasmacytic lymphoma, though his IgM could be reactive to recent infections and frequent herpetic cold sores -If the pt develops new unexplained fatigue, fevers, or chills, he will let me know in the interim and I will see him sooner -Continue follow up with dermatologist Dr. Wilhemina Bonito for skin screening -Will see the pt back in 6 months.  FOLLOW UP:  RTC with Dr Irene Limbo in 6 months. Plz schedule labs 1 week prior to clinic visit   The total time spent in the appt was 20 minutes and more than 50% was on counseling and direct patient cares.  All of the patient's questions were answered with apparent satisfaction. The patient knows to call the clinic with any problems, questions or concerns.   Sullivan Lone MD [place] AAHIVMS [facility] Hematology/Oncology Physician [facility]  (Office):       [phone] (Work cell):  [phone] (Fax):           [phone]  06/11/2019 2:34 PM  I, Yevette Edwards, am acting as a scribe for Dr. Sullivan Lone.   .I have reviewed the above documentation for accuracy and completeness, and I agree with the above. Brunetta Genera MD

## 2019-06-19 ENCOUNTER — Other Ambulatory Visit: Payer: Self-pay | Admitting: Internal Medicine

## 2019-07-18 DIAGNOSIS — L821 Other seborrheic keratosis: Secondary | ICD-10-CM | POA: Diagnosis not present

## 2019-07-18 DIAGNOSIS — Z85820 Personal history of malignant melanoma of skin: Secondary | ICD-10-CM | POA: Diagnosis not present

## 2019-07-18 DIAGNOSIS — D225 Melanocytic nevi of trunk: Secondary | ICD-10-CM | POA: Diagnosis not present

## 2019-07-18 DIAGNOSIS — L570 Actinic keratosis: Secondary | ICD-10-CM | POA: Diagnosis not present

## 2019-07-18 DIAGNOSIS — D1801 Hemangioma of skin and subcutaneous tissue: Secondary | ICD-10-CM | POA: Diagnosis not present

## 2019-07-18 DIAGNOSIS — Z85828 Personal history of other malignant neoplasm of skin: Secondary | ICD-10-CM | POA: Diagnosis not present

## 2019-07-24 DIAGNOSIS — N401 Enlarged prostate with lower urinary tract symptoms: Secondary | ICD-10-CM | POA: Diagnosis not present

## 2019-07-24 DIAGNOSIS — R3915 Urgency of urination: Secondary | ICD-10-CM | POA: Diagnosis not present

## 2019-07-24 DIAGNOSIS — N201 Calculus of ureter: Secondary | ICD-10-CM | POA: Diagnosis not present

## 2019-08-09 ENCOUNTER — Other Ambulatory Visit: Payer: Self-pay | Admitting: Internal Medicine

## 2019-08-09 DIAGNOSIS — G8911 Acute pain due to trauma: Secondary | ICD-10-CM

## 2019-08-09 MED ORDER — MELOXICAM 7.5 MG PO TABS: 7.5000 mg | ORAL_TABLET | Freq: Every day | ORAL | 5 refills | Status: DC

## 2019-08-12 ENCOUNTER — Other Ambulatory Visit: Payer: Self-pay

## 2019-08-12 ENCOUNTER — Ambulatory Visit (INDEPENDENT_AMBULATORY_CARE_PROVIDER_SITE_OTHER): Payer: PPO | Admitting: Nurse Practitioner

## 2019-08-12 ENCOUNTER — Encounter: Payer: Self-pay | Admitting: Nurse Practitioner

## 2019-08-12 ENCOUNTER — Encounter: Payer: PPO | Admitting: Family

## 2019-08-12 ENCOUNTER — Encounter: Payer: PPO | Admitting: Nurse Practitioner

## 2019-08-12 ENCOUNTER — Telehealth: Payer: Self-pay | Admitting: *Deleted

## 2019-08-12 DIAGNOSIS — Z0000 Encounter for general adult medical examination without abnormal findings: Secondary | ICD-10-CM | POA: Diagnosis not present

## 2019-08-12 NOTE — Progress Notes (Signed)
Subjective:   Antonio [name] is a 77 y.o. male who presents for Medicare Annual/Subsequent preventive examination.  Review of Systems:   Cardiac Risk Factors include: male gender;advanced age (>37men, >81 women);hypertension;dyslipidemia     Objective:    Vitals: There were no vitals taken for this visit.  There is no height or weight on file to calculate BMI.  Advanced Directives 08/12/2019 03/06/2019 05/17/2018 02/13/2018 06/21/2017  Does Patient Have a Medical Advance Directive? Yes Yes Yes Yes Yes  Type of Paramed[place] of [place];Living will;Out of facility DNR (pink MOST or yellow form) Healthcare Power of [place];Living will;Out of facility DNR (pink MOST or yellow form)  Does patient want to make changes to medical advance directive? No - Patient declined No - Patient declined No - Patient declined No - Patient declined -  Copy of [place] in Chart? No - copy requested Yes - validated most recent copy scanned in chart (See row information) No - copy requested Yes No - copy requested  Pre-existing out of facility DNR order (yellow form or pink MOST form) - - - - Yellow form placed in chart (order not valid for inpatient use)    Tobacco Social History   Tobacco Use  Smoking Status Never Smoker  Smokeless Tobacco Never Used     Counseling given: Not Answered   Clinical Intake:  Pre-visit preparation completed: Yes  Pain : No/denies pain     BMI - recorded: 25.54 Nutritional Status: BMI 25 -29 Overweight Diabetes: No  How often do you need to have someone help you when you read instructions, pamphlets, or other written materials from your doctor or pharmacy?: 1 - Never  Interpreter Needed?: No  Comments: college  Past Medical History:  Diagnosis Date  . B-cell lymphoma ([place])    testicular diffuse large b-cell lymphoma  . BCC (basal  cell carcinoma of skin)   . Carotid stenosis, asymptomatic, left   . Elevated diaphragm    right  . Erectile dysfunction   . Hyperlipidemia   . Hypertension   . Kidney stone   . Lymphoma of testis ([place])   . Macular degeneration    Dry ARMD OU  . Melanoma ([place])   . Melanoma ([place])   . Posterior vitreous detachment   . SCC (squamous cell carcinoma)   . Status post joint replacement    right TKA   Past Surgical History:  Procedure Laterality Date  . CATARACT EXTRACTION Bilateral 01/2017   Dr. Merril Abbe [facility] [place])   . CATARACT EXTRACTION W/ INTRAOCULAR LENS IMPLANT Bilateral 2018  . EYE SURGERY    . MELANOMA EXCISION  10/1999  . TOTAL KNEE ARTHROPLASTY  06/2009   Family History  Problem Relation Age of Onset  . Cataracts Mother   . Transient ischemic attack Mother   . Asthma Mother   . Cataracts Father   . Diabetes Father        borderline  . Hypertension Father   . Hyperlipidemia Father   . Hyperlipidemia Son   . Hypertension Son   . Amblyopia Neg Hx   . Blindness Neg Hx   . Glaucoma Neg Hx   . Macular degeneration Neg Hx   . Retinal detachment Neg Hx   . Strabismus Neg Hx   . Retinitis pigmentosa Neg Hx    Social History   Socioeconomic History  . Marital status: Married  Spouse name: Not on file  . Number of children: Not on file  . Years of education: Not on file  . Highest education level: Not on file  Occupational History  . Not on file  Social Needs  . Financial resource strain: Not hard at all  . Food insecurity    Worry: Never true    Inability: Never true  . Transportation needs    Medical: No    Non-medical: No  Tobacco Use  . Smoking status: Never Smoker  . Smokeless tobacco: Never Used  Substance and Sexual Activity  . Alcohol use: Yes    Comment: 7 to 9 drinks per week  . Drug use: No  . Sexual activity: Not Currently  Lifestyle  . Physical activity    Days per week: 2 days    Minutes per session: 20 min  . Stress: To some  extent  Relationships  . Social connections    Talks on phone: More than three times a week    Gets together: More than three times a week    Attends religious service: More than 4 times per year    Active member of club or organization: Yes    Attends meetings of clubs or organizations: More than 4 times per year    Relationship status: Married  Other Topics Concern  . Not on file  Social History Narrative   Social History      Diet?good      Do you drink/eat things with caffeine? seldom      Marital status?          yes                          What year were you married? 1962      Do you live in a house, apartment, assisted living, condo, trailer, etc.? house      Is it one or more stories?1      How many persons live in your home? 2      Do you have any pets in your home? (please list) no      Highest level of education completed? college      Current or past profession: president and [employer]      Do you exercise?   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 yes      Do you have a DNR form?       yes                            If not, do you want to discuss one?      Do you have signed POA/HPOA for forms?  yes      Functional Status      Do you have difficulty bathing or dressing yourself? no      Do you have difficulty preparing food or eating? no      Do you have difficulty managing your medications? no      Do you have difficulty managing your finances? no      Do you have  difficulty affording your medications? no    Outpatient Encounter Medications as of 08/12/2019  Medication Sig  . Cholecalciferol (VITAMIN D3) 2000 units TABS Take 1 tablet by mouth daily.  . Cyanocobalamin (B-12 PO) Take 1 tablet by mouth daily.  . diphenoxylate-atropine (LOMOTIL) 2.5-0.025 MG tablet Take 2 tablets by mouth 4 (four) times daily as needed for diarrhea or loose  stools.  Marland Kitchen losartan (COZAAR) 100 MG tablet Take 1 tablet (100 mg total) by mouth daily.  . meloxicam (MOBIC) 7.5 MG tablet Take 1 tablet (7.5 mg total) by mouth daily.  . Multiple Vitamins-Minerals (PRESERVISION AREDS 2 PO) Take 2 capsules by mouth daily.  . simvastatin (ZOCOR) 40 MG tablet TAKE 1 TABLET BY MOUTH EVERY DAY  . [DISCONTINUED] tamsulosin (FLOMAX) 0.4 MG CAPS capsule Take 0.4 mg by mouth daily.   No facility-administered encounter medications on file as of 08/12/2019.     Activities of Daily Living In your present state of health, do you have any difficulty performing the following activities: 08/12/2019  Hearing? Y  Vision? N  Difficulty concentrating or making decisions? N  Walking or climbing stairs? N  Dressing or bathing? N  Doing errands, shopping? N  Preparing Food and eating ? N  Using the Toilet? N  In the past six months, have you accidently leaked urine? N  Do you have problems with loss of bowel control? N  Managing your Medications? N  Managing your Finances? N  Housekeeping or managing your Housekeeping? N  Some recent data might be hidden    Patient Care Team: [name], DO as PCP - General (Geriatric Medicine)   Assessment:   This is a routine wellness examination for Antonio [name].  Exercise Activities and Dietary recommendations Current Exercise Habits: Home exercise routine, Type of exercise: strength training/weights;calisthenics;walking, Time (Minutes): 45, Frequency (Times/Week): 3, Weekly Exercise (Minutes/Week): 135, Intensity: Moderate  Goals    . Patient Stated     Pt would like to maintain current weight.        Fall Risk Fall Risk  08/12/2019 04/05/2019 03/26/2019 03/06/2019 09/05/2018  Falls in the past year? 1 1 1  0 0  Number falls in past yr: 1 0 0 0 0  Injury with Fall? 0 1 1 0 0  Comment - - Slipped on rocks, injured left side of back  - -   Is the patient's home free of loose throw rugs in walkways, pet beds, electrical  cords, etc?   yes      Grab bars in the bathroom? yes      Handrails on the stairs?   no      Adequate lighting?   yes  Timed Get Up and Go Performed: na  Depression Screen PHQ 2/9 Scores 08/12/2019 04/05/2019 03/06/2019 09/05/2018  PHQ - 2 Score 0 0 0 0    Cognitive Function MMSE - Mini Mental State Exam 02/13/2018  Orientation to time 3  Orientation to Place 5  Registration 3  Attention/ Calculation 5  Recall 2  Language- name 2 objects 2  Language- repeat 1  Language- follow 3 step command 3  Language- read & follow direction 1  Write a sentence 1  Copy design 1  Total score 27     6CIT Screen 08/12/2019  What Year? 0 points  What month? 0 points  What time? 0 points  Count back from 20 0 points  Months in reverse 0 points  Repeat phrase 2 points  Total Score 2  Immunization History  Administered Date(s) Administered  . Influenza, High Dose Seasonal PF 07/15/2017, 07/16/2018, 06/11/2019  . Pneumococcal Conjugate-13 03/04/2014  . Pneumococcal Polysaccharide-23 07/27/2010  . Tdap 03/04/2014  . Zoster 09/22/2006  . Zoster Recombinat (Shingrix) 10/06/2017, 12/09/2017    Qualifies for Shingles Vaccine? done  Screening Tests Health Maintenance  Topic Date Due  . TETANUS/TDAP  03/04/2024  . INFLUENZA VACCINE  Completed  . PNA vac Low Risk Adult  Completed   Cancer Screenings: Lung: Low Dose CT Chest recommended if Age 46-80 years, 30 pack-year currently smoking OR have quit w/in 15years. Patient does not qualify. Colorectal: aged out  Additional Screenings: Hepatitis C Screening: na      Plan:     I have personally reviewed and noted the following in the patient's chart:   . Medical and social history . Use of alcohol, tobacco or illicit drugs  . Current medications and supplements . Functional ability and status . Nutritional status . Physical activity . Advanced directives . List of other physicians . Hospitalizations, surgeries, and ER  visits in previous 12 months . Vitals . Screenings to include cognitive, depression, and falls . Referrals and appointments  In addition, I have reviewed and discussed with patient certain preventive protocols, quality metrics, and best practice recommendations. A written personalized care plan for preventive services as well as general preventive health recommendations were provided to patient.     Lauree Chandler, NP  08/12/2019

## 2019-08-12 NOTE — Telephone Encounter (Signed)
[place] Notified and agreed.

## 2019-08-12 NOTE — Patient Instructions (Addendum)
Antonio [name] , Thank you for taking time to come for your Medicare Wellness Visit. I appreciate your ongoing commitment to your health goals. Please review the following plan we discussed and let me know if I can assist you in the future.   Screening recommendations/referrals: Colonoscopy aged out.  Recommended yearly ophthalmology/optometry visit for glaucoma screening and checkup Recommended yearly dental visit for hygiene and checkup  Vaccinations: Influenza vaccine- up to date Pneumococcal vaccine up to date Tdap vaccine up to date Shingles vaccine up to date    Advanced directives: request you to bring to Dr Mariea Clonts so we can add to our system.  Conditions/risks identified: none  Next appointment: 1 year  Preventive Care 40 Years and Older, Male Preventive care refers to lifestyle choices and visits with your health care provider that can promote health and wellness. What does preventive care include?  A yearly physical exam. This is also called an annual well check.  Dental exams once or twice a year.  Routine eye exams. Ask your health care provider how often you should have your eyes checked.  Personal lifestyle choices, including:  Daily care of your teeth and gums.  Regular physical activity.  Eating a healthy diet.  Avoiding tobacco and drug use.  Limiting alcohol use.  Practicing safe sex.  Taking low doses of aspirin every day.  Taking vitamin and mineral supplements as recommended by your health care provider. What happens during an annual well check? The services and screenings done by your health care provider during your annual well check will depend on your age, overall health, lifestyle risk factors, and family history of disease. Counseling  Your health care provider may ask you questions about your:  Alcohol use.  Tobacco use.  Drug use.  Emotional well-being.  Home and relationship well-being.  Sexual activity.  Eating habits.  History  of falls.  Memory and ability to understand (cognition).  Work and work Statistician. Screening  You may have the following tests or measurements:  Height, weight, and BMI.  Blood pressure.  Lipid and cholesterol levels. These may be checked every 5 years, or more frequently if you are over 74 years old.  Skin check.  Lung cancer screening. You may have this screening every year starting at age 42 if you have a 30-pack-year history of smoking and currently smoke or have quit within the past 15 years.  Fecal occult blood test (FOBT) of the stool. You may have this test every year starting at age 23.  Flexible sigmoidoscopy or colonoscopy. You may have a sigmoidoscopy every 5 years or a colonoscopy every 10 years starting at age 22.  Prostate cancer screening. Recommendations will vary depending on your family history and other risks.  Hepatitis C blood test.  Hepatitis B blood test.  Sexually transmitted disease (STD) testing.  Diabetes screening. This is done by checking your blood sugar (glucose) after you have not eaten for a while (fasting). You may have this done every 1-3 years.  Abdominal aortic aneurysm (AAA) screening. You may need this if you are a current or former smoker.  Osteoporosis. You may be screened starting at age 50 if you are at high risk. Talk with your health care provider about your test results, treatment options, and if necessary, the need for more tests. Vaccines  Your health care provider may recommend certain vaccines, such as:  Influenza vaccine. This is recommended every year.  Tetanus, diphtheria, and acellular pertussis (Tdap, Td) vaccine. You may need  a Td booster every 10 years.  Zoster vaccine. You may need this after age 71.  Pneumococcal 13-valent conjugate (PCV13) vaccine. One dose is recommended after age 59.  Pneumococcal polysaccharide (PPSV23) vaccine. One dose is recommended after age 64. Talk to your health care provider  about which screenings and vaccines you need and how often you need them. This information is not intended to replace advice given to you by your health care provider. Make sure you discuss any questions you have with your health care provider. Document Released: 10/30/2015 Document Revised: 06/22/2016 Document Reviewed: 08/04/2015 Elsevier Interactive Patient Education  2017 [place] Prevention in the Home Falls can cause injuries. They can happen to people of all ages. There are many things you can do to make your home safe and to help prevent falls. What can I do on the outside of my home?  Regularly fix the edges of walkways and driveways and fix any cracks.  Remove anything that might make you trip as you walk through a door, such as a raised step or threshold.  Trim any bushes or trees on the path to your home.  Use bright outdoor lighting.  Clear any walking paths of anything that might make someone trip, such as rocks or tools.  Regularly check to see if handrails are loose or broken. Make sure that both sides of any steps have handrails.  Any raised decks and porches should have guardrails on the edges.  Have any leaves, snow, or ice cleared regularly.  Use sand or salt on walking paths during winter.  Clean up any spills in your garage right away. This includes oil or grease spills. What can I do in the bathroom?  Use night lights.  Install grab bars by the toilet and in the tub and shower. Do not use towel bars as grab bars.  Use non-skid mats or decals in the tub or shower.  If you need to sit down in the shower, use a plastic, non-slip stool.  Keep the floor dry. Clean up any water that spills on the floor as soon as it happens.  Remove soap buildup in the tub or shower regularly.  Attach bath mats securely with double-sided non-slip rug tape.  Do not have throw rugs and other things on the floor that can make you trip. What can I do in the  bedroom?  Use night lights.  Make sure that you have a light by your bed that is easy to reach.  Do not use any sheets or blankets that are too big for your bed. They should not hang down onto the floor.  Have a firm chair that has side arms. You can use this for support while you get dressed.  Do not have throw rugs and other things on the floor that can make you trip. What can I do in the kitchen?  Clean up any spills right away.  Avoid walking on wet floors.  Keep items that you use a lot in easy-to-reach places.  If you need to reach something above you, use a strong step stool that has a grab bar.  Keep electrical cords out of the way.  Do not use floor polish or wax that makes floors slippery. If you must use wax, use non-skid floor wax.  Do not have throw rugs and other things on the floor that can make you trip. What can I do with my stairs?  Do not leave any items on the  stairs.  Make sure that there are handrails on both sides of the stairs and use them. Fix handrails that are broken or loose. Make sure that handrails are as long as the stairways.  Check any carpeting to make sure that it is firmly attached to the stairs. Fix any carpet that is loose or worn.  Avoid having throw rugs at the top or bottom of the stairs. If you do have throw rugs, attach them to the floor with carpet tape.  Make sure that you have a light switch at the top of the stairs and the bottom of the stairs. If you do not have them, ask someone to add them for you. What else can I do to help prevent falls?  Wear shoes that:  Do not have high heels.  Have rubber bottoms.  Are comfortable and fit you well.  Are closed at the toe. Do not wear sandals.  If you use a stepladder:  Make sure that it is fully opened. Do not climb a closed stepladder.  Make sure that both sides of the stepladder are locked into place.  Ask someone to hold it for you, if possible.  Clearly mark and make  sure that you can see:  Any grab bars or handrails.  First and last steps.  Where the edge of each step is.  Use tools that help you move around (mobility aids) if they are needed. These include:  Canes.  Walkers.  Scooters.  Crutches.  Turn on the lights when you go into a dark area. Replace any light bulbs as soon as they burn out.  Set up your furniture so you have a clear path. Avoid moving your furniture around.  If any of your floors are uneven, fix them.  If there are any pets around you, be aware of where they are.  Review your medicines with your doctor. Some medicines can make you feel dizzy. This can increase your chance of falling. Ask your doctor what other things that you can do to help prevent falls. This information is not intended to replace advice given to you by your health care provider. Make sure you discuss any questions you have with your health care provider. Document Released: 07/30/2009 Document Revised: 03/10/2016 Document Reviewed: 11/07/2014 Elsevier Interactive Patient Education  2017 [employer].

## 2019-08-12 NOTE — Telephone Encounter (Signed)
Fine with me

## 2019-08-12 NOTE — Progress Notes (Signed)
    This service is provided via telemedicine  No vital signs collected/recorded due to the encounter was a telemedicine visit.   Location of patient (ex: home, work):  In the car  Patient consents to a telephone visit: Yes  Location of the provider (ex: office, home):  [facility], Office   Name of any referring provider: [name], DO  Names of all persons participating in the telemedicine service and their role in the encounter:  S.Chrae B/CMA, Sherrie Mustache, NP, and Patient   Time spent on call:  7 min with medical assistant

## 2019-08-12 NOTE — Telephone Encounter (Signed)
Antonio [name] with [facility] called and requested a verbal order for Ear Lavage for patient. Stated that patient is coming to her office this afternoon and she needs a verbal order to do. Please Advise.

## 2019-08-12 NOTE — Progress Notes (Signed)
This service is provided via telemedicine  No vital signs collected/recorded due to the encounter was a telemedicine visit.   Location of patient (ex: home, work):  ***  Patient consents to a telephone visit:  ***  Location of the provider (ex: office, home):  ***  Name of any referring provider:  ***  Names of all persons participating in the telemedicine service and their role in the encounter:  ***  Time spent on call:  ***  

## 2019-08-13 NOTE — Progress Notes (Signed)
  This encounter was created in error - please disregard.Wife requested patient to be seen by another provider.

## 2019-08-14 ENCOUNTER — Encounter: Payer: Self-pay | Admitting: Internal Medicine

## 2019-08-14 ENCOUNTER — Other Ambulatory Visit: Payer: Self-pay

## 2019-08-14 ENCOUNTER — Non-Acute Institutional Stay: Payer: PPO | Admitting: Internal Medicine

## 2019-08-14 VITALS — BP 136/82 | HR 97 | Temp 98.5°F | Ht 72.0 in | Wt 190.6 lb

## 2019-08-14 DIAGNOSIS — Z7189 Other specified counseling: Secondary | ICD-10-CM

## 2019-08-14 NOTE — Progress Notes (Signed)
Location:  [facility]   Place of Service:   clinic  Provider: Feige Lowdermilk L. Mariea Clonts, D.O., C.M.D.  Code Status: DNR; MOST done today:  DNR, limited additional interventions, antibiotics if indicated, IVFs for a defined trial period, tube feeding for a defined trial period; he had discussed these desires with his wife prior to the visit--we also reviewed his living will  Goals of Care:  Advanced Directives 08/12/2019  Does Patient Have a Medical Advance Directive? Yes  Type of Paramedic of [place];Living will;Out of facility DNR (pink MOST or yellow form)  Does patient want to make changes to medical advance directive? No - Patient declined  Copy of [place] in Chart? No - copy requested  Pre-existing out of facility DNR order (yellow form or pink MOST form) -     Chief Complaint  Patient presents with  . Acute Visit    Wants to discuss DNR and MOST Form    HPI: Patient is a 78 y.o. male seen today for an acute visit for DNR and MOST form completion.    He was in a bed in the hospital in [facility] with his kidney stone.  He watched another patient come in who had an arrest in adjacent room--sounds like he was in the ED at the time.  He does not want to go through all of that if his quality of life will be poor.    We reviewed the MOST and completed the sections in vynca and a copy was printed for him, but a PINK copy must still be made.  He requests DNR status, limited additional interventions in a terminal state, wants antibiotics and requests a trial of fluids and tube feeding.  He seemed less certain about the fluids and tube feeding and struggled some about his decision since it was going to be referring to end of life not current state.  He may change these to match his living will more consistently in the future.  He is going to again review his choices with his wife, [name] Atharva Kohen), when he returns home.    Past Medical History:   Diagnosis Date  . 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 BCC (basal cell carcinoma of skin)   . Carotid stenosis, asymptomatic, left   . Elevated diaphragm    right  . Erectile dysfunction   . Hyperlipidemia   . Hypertension   . Kidney stone   . 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 Macular degeneration    Dry ARMD OU  . Melanoma ([place])   . Melanoma ([place])   . Posterior vitreous detachment   . SCC (squamous cell carcinoma)   . Status post joint replacement    right TKA    Past Surgical History:  Procedure Laterality Date  . CATARACT EXTRACTION Bilateral 01/2017   Dr. Merril Abbe [facility], [place])   . CATARACT EXTRACTION W/ INTRAOCULAR LENS IMPLANT Bilateral 2018  . EYE SURGERY    . MELANOMA EXCISION  10/1999  . TOTAL KNEE ARTHROPLASTY  06/2009    No Known Allergies  Outpatient Encounter Medications as of 08/14/2019  Medication Sig  . Cholecalciferol (VITAMIN D3) 2000 units TABS Take 1 tablet by mouth daily.  . Cyanocobalamin (B-12 PO) Take 1 tablet by mouth daily.  . diphenoxylate-atropine (LOMOTIL) 2.5-0.025 MG tablet Take 2 tablets by mouth 4 (four) times daily as needed for diarrhea or loose stools.  Marland Kitchen losartan (COZAAR) 100  MG tablet Take 1 tablet (100 mg total) by mouth daily.  . meloxicam (MOBIC) 7.5 MG tablet Take 1 tablet (7.5 mg total) by mouth daily.  . Multiple Vitamins-Minerals (PRESERVISION AREDS 2 PO) Take 2 capsules by mouth daily.  . simvastatin (ZOCOR) 40 MG tablet TAKE 1 TABLET BY MOUTH EVERY DAY   No facility-administered encounter medications on file as of 08/14/2019.     Review of Systems:  Review of Systems  Constitutional: Negative for chills, fever and malaise/fatigue.  HENT: Negative for congestion and sore throat.   Respiratory: Negative for cough and shortness of breath.   Gastrointestinal: Negative for diarrhea.  Psychiatric/Behavioral: Positive for memory loss.    Health Maintenance  Topic Date Due  .  TETANUS/TDAP  03/04/2024  . INFLUENZA VACCINE  Completed  . PNA vac Low Risk Adult  Completed    Physical Exam: Vitals:   08/14/19 0831  BP: 136/82  Pulse: 97  Temp: 98.5 F (36.9 C)  TempSrc: Oral  SpO2: 98%  Weight: 190 lb 9.6 oz (86.5 kg)  Height: 6' (1.829 m)   Body mass index is 25.85 kg/m. Physical Exam Vitals signs reviewed.  Constitutional:      General: He is not in acute distress.    Appearance: Normal appearance. He is normal weight. He is not ill-appearing or toxic-appearing.  Eyes:     Comments: Uses reading glasses  Cardiovascular:     Heart sounds: Normal heart sounds.  Pulmonary:     Effort: Pulmonary effort is normal.  Neurological:     Mental Status: He is alert.     Comments: Tremor of right hand  Psychiatric:        Mood and Affect: Mood normal.     Comments: Did become anxious during completion of form    Labs reviewed: Basic Metabolic Panel: Recent Labs    11/12/18 1106 11/12/18 1107 06/04/19 1100  NA 143  --  142  K 4.1  --  4.7  CL 106  --  108  CO2 30  --  24  GLUCOSE 101*  --  85  BUN 14  --  24*  CREATININE 0.80  --  0.80  CALCIUM 9.6  --  9.1  TSH  --  1.569  --    Liver Function Tests: Recent Labs    11/12/18 1106 06/04/19 1100  AST 22 23  ALT 32 31  ALKPHOS 72 63  BILITOT 0.9 0.6  PROT 7.7 6.6  ALBUMIN 4.5 3.9   No results for input(s): LIPASE, AMYLASE in the last 8760 hours. No results for input(s): AMMONIA in the last 8760 hours. CBC: Recent Labs    11/12/18 1106 06/04/19 1100  WBC 6.1 6.2  NEUTROABS 4.0 4.2  HGB 15.0 12.8*  HCT 46.6  46.0 39.2  MCV 102.2* 101.0*  PLT 220 200   Lipid Panel: Recent Labs    02/26/19  CHOL 140  HDL 74*  LDLCALC 53  TRIG 67   Lab Results  Component Value Date   HGBA1C 5.6 02/26/2019   Assessment/Plan 1. ACP (advance care planning) MOST completed today as above About 45 minutes were spent completing this and reviewing his other documents, discussing examples  and providing clarity and about differences b/w legal documents and physician orders  Labs/tests ordered:  MOST orders Next appt: has his annual exam scheduled with me Just had AWV with NP     Adlean Hardeman L. Kimani Hovis, D.O. [facility] [phone]  [place], [place] [address] Cell Phone (Mon-Fri 8am-5pm):  [phone] On Call:  [phone] & follow prompts after 5pm & weekends Office Phone:  [phone] Office Fax:  [phone]

## 2019-09-02 DIAGNOSIS — Z85828 Personal history of other malignant neoplasm of skin: Secondary | ICD-10-CM | POA: Diagnosis not present

## 2019-09-02 DIAGNOSIS — C44629 Squamous cell carcinoma of skin of left upper limb, including shoulder: Secondary | ICD-10-CM | POA: Diagnosis not present

## 2019-09-02 DIAGNOSIS — Z85820 Personal history of malignant melanoma of skin: Secondary | ICD-10-CM | POA: Diagnosis not present

## 2019-09-03 ENCOUNTER — Other Ambulatory Visit: Payer: Self-pay

## 2019-09-03 ENCOUNTER — Encounter: Payer: Self-pay | Admitting: Nurse Practitioner

## 2019-09-03 ENCOUNTER — Ambulatory Visit (INDEPENDENT_AMBULATORY_CARE_PROVIDER_SITE_OTHER): Payer: PPO | Admitting: Nurse Practitioner

## 2019-09-03 VITALS — Ht 72.0 in | Wt 190.6 lb

## 2019-09-03 DIAGNOSIS — E7800 Pure hypercholesterolemia, unspecified: Secondary | ICD-10-CM

## 2019-09-03 NOTE — Progress Notes (Signed)
This service is provided via telemedicine  No vital signs collected/recorded due to the encounter was a telemedicine visit.   Location of patient (ex: home, work):  Home  Patient consents to a telephone visit:  Yes  Location of the provider (ex: office, home):  [facility]  Name of any referring provider:  Hollace Kinnier, DO  Names of all persons participating in the telemedicine service and their role in the encounter:  Bonney Leitz, [place]; Sherrie Mustache, NP; patient  Time spent on call:  7:21 minutes - CMA time only     Careteam: Patient Care Team: [name], DO as PCP - General (Geriatric Medicine)  Advanced Directive information Does Patient Have a Medical Advance Directive?: Yes, Type of Advance Directive: Out of facility DNR (pink MOST or yellow form), Does patient want to make changes to medical advance directive?: No - Patient declined  No Known Allergies  Chief Complaint  Patient presents with  . Acute Visit    Patient is seen for medication adherence.     HPI: Patient is a 78 y.o. male via televisit for medication adherence.  Pt has shown non adherence to statin based on detailed report that was given to [facility].   Hyperlipidemia- reports he is taking is simvastatin 40 mg by mouth as prescribed. Reports he has a mtn house and got a 90 supply there and a 90 day supply locally. Therefore he did not need a refill for some time because he had so much extra. Not missed a dose in 3 years. Very religious over medication.  Questions timing he should take it.     Review of Systems:  Review of Systems  Constitutional: Negative for chills and fever.  Musculoskeletal: Negative for myalgias.    Past Medical History:  Diagnosis Date  . 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 BCC (basal cell carcinoma of skin)   . Carotid stenosis, asymptomatic, left   . Elevated diaphragm    right  . Erectile dysfunction   . Hyperlipidemia    . Hypertension   . Kidney stone   . 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 Macular degeneration    Dry ARMD OU  . Melanoma ([place])   . Melanoma ([place])   . Posterior vitreous detachment   . SCC (squamous cell carcinoma)   . Status post joint replacement    right TKA   Past Surgical History:  Procedure Laterality Date  . CATARACT EXTRACTION Bilateral 01/2017   Dr. Merril Abbe [facility], [place])   . CATARACT EXTRACTION W/ INTRAOCULAR LENS IMPLANT Bilateral 2018  . EYE SURGERY    . MELANOMA EXCISION  10/1999  . TOTAL KNEE ARTHROPLASTY  06/2009   Social History:   reports that he has never smoked. He has never used smokeless tobacco. He reports current alcohol use. He reports that he does not use drugs.  Family History  Problem Relation Age of Onset  . Cataracts Mother   . Transient ischemic attack Mother   . Asthma Mother   . Cataracts Father   . Diabetes Father        borderline  . Hypertension Father   . Hyperlipidemia Father   . Hyperlipidemia Son   . Hypertension Son   . Amblyopia Neg Hx   . Blindness Neg Hx   . Glaucoma Neg Hx   . Macular degeneration Neg Hx   . Retinal detachment Neg Hx   . Strabismus Neg Hx   .  Retinitis pigmentosa Neg Hx     Medications: Patient's Medications  New Prescriptions   No medications on file  Previous Medications   CHOLECALCIFEROL (VITAMIN D3) 2000 UNITS TABS    Take 1 tablet by mouth daily.   CYANOCOBALAMIN (B-12 PO)    Take 1 tablet by mouth daily.   LOSARTAN (COZAAR) 100 MG TABLET    Take 1 tablet (100 mg total) by mouth daily.   MELOXICAM (MOBIC) 7.5 MG TABLET    Take 1 tablet (7.5 mg total) by mouth daily.   MULTIPLE VITAMINS-MINERALS (PRESERVISION AREDS 2 PO)    Take 1 capsule by mouth 2 (two) times daily.    SIMVASTATIN (ZOCOR) 40 MG TABLET    TAKE 1 TABLET BY MOUTH EVERY DAY  Modified Medications   No medications on file  Discontinued Medications   DIPHENOXYLATE-ATROPINE (LOMOTIL) 2.5-0.025 MG TABLET    Take 2 tablets by mouth 4  (four) times daily as needed for diarrhea or loose stools.    Physical Exam:  Vitals:   09/03/19 1040  Weight: 190 lb 9.6 oz (86.5 kg)  Height: 6' (1.829 m)   Body mass index is 25.85 kg/m. Wt Readings from Last 3 Encounters:  09/03/19 190 lb 9.6 oz (86.5 kg)  08/14/19 190 lb 9.6 oz (86.5 kg)  06/11/19 188 lb 4.8 oz (85.4 kg)    Labs reviewed: Basic Metabolic Panel: Recent Labs    11/12/18 1106 11/12/18 1107 06/04/19 1100  NA 143  --  142  K 4.1  --  4.7  CL 106  --  108  CO2 30  --  24  GLUCOSE 101*  --  85  BUN 14  --  24*  CREATININE 0.80  --  0.80  CALCIUM 9.6  --  9.1  TSH  --  1.569  --    Liver Function Tests: Recent Labs    11/12/18 1106 06/04/19 1100  AST 22 23  ALT 32 31  ALKPHOS 72 63  BILITOT 0.9 0.6  PROT 7.7 6.6  ALBUMIN 4.5 3.9   No results for input(s): LIPASE, AMYLASE in the last 8760 hours. No results for input(s): AMMONIA in the last 8760 hours. CBC: Recent Labs    11/12/18 1106 06/04/19 1100  WBC 6.1 6.2  NEUTROABS 4.0 4.2  HGB 15.0 12.8*  HCT 46.6  46.0 39.2  MCV 102.2* 101.0*  PLT 220 200   Lipid Panel: Recent Labs    02/26/19  CHOL 140  HDL 74*  LDLCALC 53  TRIG 67   TSH: Recent Labs    11/12/18 1107  TSH 1.569   A1C: Lab Results  Component Value Date   HGBA1C 5.6 02/26/2019     Assessment/Plan 1. Pure hypercholesterolemia -compliant with statin, states he got a double supply and thinks this is why he looks "nonadherent"  -recommended taking in the evening which he plans to start doing. -no side effects noted.  -LDL at goal on last labs.   Carlos American. Harle Battiest  [facility] [phone]    Virtual Visit via Telephone Note  I connected with@ on 09/03/19 at  1:00 PM EST by telephone and verified that I am speaking with the correct person using two identifiers.  Location: Patient: home Provider: [place]   I discussed the limitations, risks, security and privacy  concerns of performing an evaluation and management service by telephone and the availability of in person appointments. I also discussed with the patient that there  may be a patient responsible charge related to this service. The patient expressed understanding and agreed to proceed.   I discussed the assessment and treatment plan with the patient. The patient was provided an opportunity to ask questions and all were answered. The patient agreed with the plan and demonstrated an understanding of the instructions.   The patient was advised to call back or seek an in-person evaluation if the symptoms worsen or if the condition fails to improve as anticipated.  I provided 5 minutes of non-face-to-face time during this encounter.  Carlos American. Harle Battiest Avs printed and mailed

## 2019-09-23 ENCOUNTER — Other Ambulatory Visit: Payer: Self-pay | Admitting: Internal Medicine

## 2019-09-23 NOTE — Telephone Encounter (Signed)
Routing to provider for approval. Not on patient's current medication list. Could not find why medication was discontinued from list. Please advise

## 2019-10-19 ENCOUNTER — Other Ambulatory Visit: Payer: Self-pay | Admitting: Internal Medicine

## 2019-11-06 ENCOUNTER — Encounter (INDEPENDENT_AMBULATORY_CARE_PROVIDER_SITE_OTHER): Payer: PPO | Admitting: Ophthalmology

## 2019-11-11 NOTE — Progress Notes (Signed)
[facility] Note  11/13/2019     CHIEF COMPLAINT Patient presents for Retina Follow Up   HISTORY OF PRESENT ILLNESS: Antonio [name] is a 79 y.o. male who presents to the clinic today for:   HPI    Retina Follow Up    Patient presents with  Dry AMD.  In both eyes.  This started 6 months ago.  Severity is moderate.  I, the attending physician,  performed the HPI with the patient and updated documentation appropriately.          Comments    Patient here for 6 months retina follow up for non exu ARMD OU. Patient states vision seem to be the same. No eye pain.        Last edited by Bernarda Caffey, MD on 11/16/2019  2:12 PM. (History)    pt states he is doing well, he has no complaints about his vision, he is still using PreserVision vits and checking his amsler grid, he just got his first covid vaccine   Referring physician: [name], DO [place],  [place] [id number]  HISTORICAL INFORMATION:   Selected notes from the [facility] for ARMD OU; Moved from [place], [place]  Ocular Hx - nonexudative ARMD OU; PVD OU; pseudophakia OU (Toric OU - 12/2016 by Dr. Nance Pear, [place], [facility]);  PMH - HTN; [facility] - ARMD - Mother   CURRENT MEDICATIONS: No current outpatient medications on file. (Ophthalmic Drugs)   No current facility-administered medications for this visit. (Ophthalmic Drugs)   Current Outpatient Medications (Other)  Medication Sig  . Cholecalciferol (VITAMIN D3) 2000 units TABS Take 1 tablet by mouth daily.  . Cyanocobalamin (B-12 PO) Take 1 tablet by mouth daily.  Marland Kitchen losartan (COZAAR) 100 MG tablet TAKE ONE TABLET BY MOUTH ONE TIME DAILY   . meloxicam (MOBIC) 7.5 MG tablet Take 1 tablet (7.5 mg total) by mouth daily.  . montelukast (SINGULAIR) 10 MG tablet TAKE 1 TABLET BY MOUTH EVERYDAY AT BEDTIME  . Multiple Vitamins-Minerals (PRESERVISION AREDS 2 PO) Take 1 capsule by mouth 2 (two) times daily.    . simvastatin (ZOCOR) 40 MG tablet TAKE 1 TABLET BY MOUTH EVERY DAY   No current facility-administered medications for this visit. (Other)      REVIEW OF SYSTEMS: ROS    Positive for: Musculoskeletal, Endocrine, Cardiovascular, Eyes   Negative for: Constitutional, Gastrointestinal, Neurological, Skin, Genitourinary, HENT, Respiratory, Psychiatric, Allergic/Imm, Heme/Lymph   Last edited by Theodore Demark, COA on 11/13/2019  9:19 AM. (History)       ALLERGIES No Known Allergies  PAST MEDICAL HISTORY Past Medical History:  Diagnosis Date  . 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 BCC (basal cell carcinoma of skin)   . Carotid stenosis, asymptomatic, left   . Elevated diaphragm    right  . Erectile dysfunction   . Hyperlipidemia   . Hypertension   . Kidney stone   . Lymphoma of testis ([place])   . Macular degeneration    Dry ARMD OU  . Melanoma ([place])   . Melanoma ([place])   . Posterior vitreous detachment   . SCC (squamous cell carcinoma)   . Status post joint replacement    right TKA   Past Surgical History:  Procedure Laterality Date  . CATARACT EXTRACTION Bilateral 01/2017   Dr. Merril Abbe [facility], [place])   . CATARACT EXTRACTION W/ INTRAOCULAR LENS IMPLANT Bilateral  2018  . EYE SURGERY    . MELANOMA EXCISION  10/1999  . TOTAL KNEE ARTHROPLASTY  06/2009    FAMILY HISTORY Family History  Problem Relation Age of Onset  . Cataracts Mother   . Transient ischemic attack Mother   . Asthma Mother   . Cataracts Father   . Diabetes Father        borderline  . Hypertension Father   . Hyperlipidemia Father   . Hyperlipidemia Son   . Hypertension Son   . Amblyopia Neg Hx   . Blindness Neg Hx   . Glaucoma Neg Hx   . Macular degeneration Neg Hx   . Retinal detachment Neg Hx   . Strabismus Neg Hx   . Retinitis pigmentosa Neg Hx     SOCIAL HISTORY Social History   Tobacco Use  . Smoking status: Never Smoker  . Smokeless tobacco:  Never Used  Substance Use Topics  . Alcohol use: Yes    Comment: 7 to 9 drinks per week  . Drug use: No         OPHTHALMIC EXAM:  Base Eye Exam    Visual Acuity (Snellen - Linear)      Right Left   Dist cc 20/20 20/20   Correction: Glasses       Tonometry (Tonopen, 9:17 AM)      Right Left   Pressure 13 15       Pupils      Dark Light Shape React APD   Right 3 2 Round Brisk None   Left 3 2 Round Brisk None       Visual Fields (Counting fingers)      Left Right    Full Full       Extraocular Movement      Right Left    Full, Ortho Full, Ortho       Neuro/Psych    Oriented x3: Yes   Mood/Affect: Normal       Dilation    Both eyes: 1.0% Mydriacyl, 2.5% Phenylephrine @ 9:16 AM        Slit Lamp and Fundus Exam    External Exam      Right Left   External Brow ptosis - mild Brow ptosis - mild       Slit Lamp Exam      Right Left   Lids/Lashes Dermatochalasis - upper lid Dermatochalasis - upper lid   Conjunctiva/Sclera mild inferior Conjunctivochalasis mild inferior Conjunctivochalasis, nasal Pinguecula   Cornea Arcus, trace Punctate epithelial erosions, Well healed temporal cataract wounds Arcus, Well healed cataract wounds   Anterior Chamber Deep and quiet Deep and quiet   Iris Round with moderate dilated to 68mm Round with moderate dilated to 62mm   Lens toric Posterior chamber intraocular lens in good postion with marks at 0300 and 0900, trace Posterior capsular opacification Toric Posterior chamber intraocular lens in good position with marks at 0200 and 0800; trace Posterior capsular opacification   Vitreous Vitreous syneresis, Posterior vitreous detachment Vitreous syneresis, Posterior vitreous detachment       Fundus Exam      Right Left   Disc Compact, Tilted with Peripapillary atrophy 360 compact, mild tilt, Peripapillary atrophy 360, mild Pallor   C/D Ratio 0.4 0.5   Macula Flat, Blunted foveal reflex, Drusen, RPE mottling, clumping and atrophy  - stable, mild, No heme or edema Good foveal reflex, Drusen, RPE mottling and clumping - stable, mild, No heme or edema   Vessels Mild  Vascular attenuation, Tortuousity Vascular attenuation, Tortuous, mild AV crossing changes   Periphery Attached; scattered Reticular degeneration Attached; scattered Reticular degeneration        Refraction    Wearing Rx      Sphere Cylinder Axis Add   Right -0.75 +0.50 106 +2.50   Left -0.50 +0.75 121 +2.50   Type: PAL          IMAGING AND PROCEDURES  Imaging and Procedures for 11/10/17  OCT, Retina - OU - Both Eyes       Right Eye Quality was good. Central Foveal Thickness: 321. Progression has been stable. Findings include normal foveal contour, no IRF, no SRF, retinal drusen , outer retinal atrophy (Stable focal areas of outer retinal atrophy / ellipsoid dropout; scattered drusen).   Left Eye Quality was good. Central Foveal Thickness: 326. Progression has been stable. Findings include normal foveal contour, no IRF, no SRF, retinal drusen  (Scattered drusen).   Notes Images captured and stored on drive  Diagnosis / Impression:  Nonexudative ARMD OU - no significant change from prior  Clinical management:  See below  Abbreviations: NFP - Normal foveal profile. CME - cystoid macular edema. PED - pigment epithelial detachment. IRF - intraretinal fluid. SRF - subretinal fluid. EZ - ellipsoid zone. ERM - epiretinal membrane. ORA - outer retinal atrophy. ORT - outer retinal tubulation. SRHM - subretinal hyper-reflective material                  ASSESSMENT/PLAN:    ICD-10-CM   1. Intermediate stage nonexudative age-related macular degeneration of both eyes  H35.3132   2. Retinal edema  H35.81 OCT, Retina - OU - Both Eyes  3. Pseudophakia of both eyes  Z96.1   4. Ocular hypertension, bilateral  H40.053   5. Bilateral posterior capsular opacification  H26.493     1. Age related macular degeneration, non-exudative, both eyes  -  The incidence, anatomy, and pathology of dry AMD, risk of progression, and the AREDS and AREDS 2 study including smoking risks discussed with patient.  - no longer using Foresee AMD monitoring device due to insurance issues  - no significant progression on exam or OCT -- scattered drusen and ORA  - f/u 6 months, sooner/PRN  2. No retinal edema on exam or OCT  3. Pseudophakia OU  - s/p CE/PCIOL OU (Toric + femto) 12/2016 by Dr. Nance Pear in [facility]  - had post op IOP issue OS  - beautiful surgery, doing well  - monitor  4. H/o ocular hypertension OU  - IOP good today (13,15) off combigan  - monitor  5. PCO OU  - Discussed YAG Cap treatment for PCO;  - not yet visually significant  - monitor for now  Ophthalmic Meds Ordered this visit:  No orders of the defined types were placed in this encounter.      Return in about 6 months (around 05/12/2020) for f/u non-exu ARMD OU, DFE, OCT.  There are no Patient Instructions on file for this visit.   Explained the diagnoses, plan, and follow up with the patient and they expressed understanding.  Patient expressed understanding of the importance of proper follow up care.   This document serves as a record of services personally performed by Gardiner Sleeper, MD, PhD. It was created on their behalf by Roselee Nova, COMT. The creation of this record is the provider's dictation and/or activities during the visit.  Electronically signed by: Roselee Nova, COMT 11/16/19 2:17 PM  This document serves as a record of services personally performed by Gardiner Sleeper, MD, PhD. It was created on their behalf by Ernest Mallick, OA, an ophthalmic assistant. The creation of this record is the provider's dictation and/or activities during the visit.    Electronically signed by: Ernest Mallick, OA 01.27.2021 2:17 PM   Gardiner Sleeper, M.D., Ph.D. Diseases & Surgery of the Retina and [place] 11/13/2019   I have  reviewed the above documentation for accuracy and completeness, and I agree with the above. Gardiner Sleeper, M.D., Ph.D. 11/16/19 2:17 PM    Abbreviations: M myopia (nearsighted); A astigmatism; H hyperopia (farsighted); P presbyopia; Mrx spectacle prescription;  CTL contact lenses; OD right eye; OS left eye; OU both eyes  XT exotropia; ET esotropia; PEK punctate epithelial keratitis; PEE punctate epithelial erosions; DES dry eye syndrome; MGD meibomian gland dysfunction; ATs artificial tears; PFAT's preservative free artificial tears; [place] nuclear sclerotic cataract; PSC posterior subcapsular cataract; ERM epi-retinal membrane; PVD posterior vitreous detachment; RD retinal detachment; DM diabetes mellitus; DR diabetic retinopathy; NPDR non-proliferative diabetic retinopathy; PDR proliferative diabetic retinopathy; CSME clinically significant macular edema; DME diabetic macular edema; dbh dot blot hemorrhages; CWS cotton wool spot; POAG primary open angle glaucoma; C/D cup-to-disc ratio; HVF humphrey visual field; GVF goldmann visual field; OCT optical coherence tomography; IOP intraocular pressure; BRVO Branch retinal vein occlusion; CRVO central retinal vein occlusion; CRAO central retinal artery occlusion; BRAO branch retinal artery occlusion; RT retinal tear; SB scleral buckle; PPV pars plana vitrectomy; VH Vitreous hemorrhage; PRP panretinal laser photocoagulation; IVK intravitreal kenalog; VMT vitreomacular traction; MH Macular hole;  NVD neovascularization of the disc; NVE neovascularization elsewhere; AREDS age related eye disease study; ARMD age related macular degeneration; POAG primary open angle glaucoma; EBMD epithelial/anterior basement membrane dystrophy; ACIOL anterior chamber intraocular lens; IOL intraocular lens; PCIOL posterior chamber intraocular lens; Phaco/IOL phacoemulsification with intraocular lens placement; [place] photorefractive keratectomy; LASIK laser assisted in situ keratomileusis;  HTN hypertension; DM diabetes mellitus; COPD chronic obstructive pulmonary disease

## 2019-11-13 ENCOUNTER — Ambulatory Visit (INDEPENDENT_AMBULATORY_CARE_PROVIDER_SITE_OTHER): Payer: PPO | Admitting: Ophthalmology

## 2019-11-13 ENCOUNTER — Encounter (INDEPENDENT_AMBULATORY_CARE_PROVIDER_SITE_OTHER): Payer: Self-pay | Admitting: Ophthalmology

## 2019-11-13 DIAGNOSIS — H40053 Ocular hypertension, bilateral: Secondary | ICD-10-CM

## 2019-11-13 DIAGNOSIS — H26493 Other secondary cataract, bilateral: Secondary | ICD-10-CM | POA: Diagnosis not present

## 2019-11-13 DIAGNOSIS — H353132 Nonexudative age-related macular degeneration, bilateral, intermediate dry stage: Secondary | ICD-10-CM | POA: Diagnosis not present

## 2019-11-13 DIAGNOSIS — H3581 Retinal edema: Secondary | ICD-10-CM

## 2019-11-13 DIAGNOSIS — Z961 Presence of intraocular lens: Secondary | ICD-10-CM

## 2019-11-13 DIAGNOSIS — R351 Nocturia: Secondary | ICD-10-CM | POA: Diagnosis not present

## 2019-11-13 DIAGNOSIS — N401 Enlarged prostate with lower urinary tract symptoms: Secondary | ICD-10-CM | POA: Diagnosis not present

## 2019-11-13 DIAGNOSIS — R3915 Urgency of urination: Secondary | ICD-10-CM | POA: Diagnosis not present

## 2019-11-16 ENCOUNTER — Encounter (INDEPENDENT_AMBULATORY_CARE_PROVIDER_SITE_OTHER): Payer: Self-pay | Admitting: Ophthalmology

## 2019-11-18 DIAGNOSIS — D485 Neoplasm of uncertain behavior of skin: Secondary | ICD-10-CM | POA: Diagnosis not present

## 2019-11-18 DIAGNOSIS — C44622 Squamous cell carcinoma of skin of right upper limb, including shoulder: Secondary | ICD-10-CM | POA: Diagnosis not present

## 2019-11-18 DIAGNOSIS — Z85820 Personal history of malignant melanoma of skin: Secondary | ICD-10-CM | POA: Diagnosis not present

## 2019-11-18 DIAGNOSIS — D045 Carcinoma in situ of skin of trunk: Secondary | ICD-10-CM | POA: Diagnosis not present

## 2019-11-18 DIAGNOSIS — L570 Actinic keratosis: Secondary | ICD-10-CM | POA: Diagnosis not present

## 2019-11-18 DIAGNOSIS — D1801 Hemangioma of skin and subcutaneous tissue: Secondary | ICD-10-CM | POA: Diagnosis not present

## 2019-11-18 DIAGNOSIS — L821 Other seborrheic keratosis: Secondary | ICD-10-CM | POA: Diagnosis not present

## 2019-11-18 DIAGNOSIS — Z85828 Personal history of other malignant neoplasm of skin: Secondary | ICD-10-CM | POA: Diagnosis not present

## 2019-11-19 ENCOUNTER — Telehealth: Payer: Self-pay | Admitting: *Deleted

## 2019-11-19 DIAGNOSIS — G8911 Acute pain due to trauma: Secondary | ICD-10-CM

## 2019-11-19 DIAGNOSIS — M545 Low back pain, unspecified: Secondary | ICD-10-CM

## 2019-11-19 MED ORDER — MELOXICAM 7.5 MG PO TABS: 7.5000 mg | ORAL_TABLET | Freq: Two times a day (BID) | ORAL | 0 refills | Status: DC | PRN

## 2019-11-19 NOTE — Telephone Encounter (Signed)
Ok to resume the meloxicam 7.5mg  po bid but please remind him that it is important to take it with food to prevent stomach ulcers and bleeding AND that it can begin to affect the kidneys some with prolonged use so we will monitor carefully.  If he is ok with these risks, please send to his pharmacy of choice (there are two so not sure which he wants to use this time or I would have just sent it).

## 2019-11-19 NOTE — Telephone Encounter (Signed)
Patient called and stated that he Used to take Meloxicam 7.5mg  twice daily. Stated that he had cut it back to One daily a year ago. Stated that he has been having neck pain again and wants to go back to taking Meloxicam 7.5mg  Twice a day. Patient is requesting a 90 day Rx to be sent to Pharmacy. Please Advise.

## 2019-11-19 NOTE — Telephone Encounter (Signed)
Pended Rx and sent to Dr. Mariea Clonts for approval due to [place].  Pharmacy Confirmed.

## 2019-11-19 NOTE — Telephone Encounter (Signed)
LMOM for patient to Return call.

## 2019-11-19 NOTE — Addendum Note (Signed)
Addended by: Rafael Bihari A on: 11/19/2019 03:48 PM   Modules accepted: Orders

## 2019-11-19 NOTE — Addendum Note (Signed)
Addended by: [name] on: 11/19/2019 03:50 PM   Modules accepted: Orders

## 2019-11-25 ENCOUNTER — Telehealth: Payer: Self-pay | Admitting: *Deleted

## 2019-11-25 NOTE — Telephone Encounter (Signed)
Patient called - Wants to reschedule appts currently 2/8 and 2/15 due to start of [place] season. Appts cancelled. Schedule message sent.

## 2019-11-26 ENCOUNTER — Other Ambulatory Visit: Payer: Self-pay | Admitting: *Deleted

## 2019-11-26 MED ORDER — LOSARTAN POTASSIUM 100 MG PO TABS: 100.0000 mg | ORAL_TABLET | Freq: Every day | ORAL | 1 refills | Status: DC

## 2019-11-26 NOTE — Telephone Encounter (Signed)
[facility] 

## 2019-11-27 ENCOUNTER — Telehealth: Payer: Self-pay | Admitting: Hematology

## 2019-11-27 NOTE — Telephone Encounter (Signed)
Scheduled appt per 2/8 sch message - pt aware of new appt date and time

## 2019-12-05 ENCOUNTER — Other Ambulatory Visit: Payer: PPO

## 2019-12-12 ENCOUNTER — Ambulatory Visit: Payer: PPO | Admitting: Hematology

## 2019-12-19 ENCOUNTER — Inpatient Hospital Stay: Payer: PPO | Attending: Hematology

## 2019-12-19 ENCOUNTER — Other Ambulatory Visit: Payer: Self-pay

## 2019-12-19 DIAGNOSIS — Z8579 Personal history of other malignant neoplasms of lymphoid, hematopoietic and related tissues: Secondary | ICD-10-CM | POA: Diagnosis not present

## 2019-12-19 DIAGNOSIS — M899 Disorder of bone, unspecified: Secondary | ICD-10-CM | POA: Insufficient documentation

## 2019-12-19 DIAGNOSIS — Z923 Personal history of irradiation: Secondary | ICD-10-CM | POA: Diagnosis not present

## 2019-12-19 DIAGNOSIS — D7589 Other specified diseases of blood and blood-forming organs: Secondary | ICD-10-CM

## 2019-12-19 DIAGNOSIS — Z9221 Personal history of antineoplastic chemotherapy: Secondary | ICD-10-CM | POA: Diagnosis not present

## 2019-12-19 DIAGNOSIS — D472 Monoclonal gammopathy: Secondary | ICD-10-CM

## 2019-12-19 LAB — CBC WITH DIFFERENTIAL/PLATELET
Abs Immature Granulocytes: 0.03 10*3/uL (ref 0.00–0.07)
Basophils Absolute: 0.1 10*3/uL (ref 0.0–0.1)
Basophils Relative: 1 %
Eosinophils Absolute: 0.1 10*3/uL (ref 0.0–0.5)
Eosinophils Relative: 1 %
HCT: 42.8 % (ref 39.0–52.0)
Hemoglobin: 13.9 g/dL (ref 13.0–17.0)
Immature Granulocytes: 0 %
Lymphocytes Relative: 26 %
Lymphs Abs: 1.8 10*3/uL (ref 0.7–4.0)
MCH: 33.7 pg (ref 26.0–34.0)
MCHC: 32.5 g/dL (ref 30.0–36.0)
MCV: 103.6 fL — ABNORMAL HIGH (ref 80.0–100.0)
Monocytes Absolute: 0.7 10*3/uL (ref 0.1–1.0)
Monocytes Relative: 10 %
Neutro Abs: 4.3 10*3/uL (ref 1.7–7.7)
Neutrophils Relative %: 62 %
Platelets: 204 10*3/uL (ref 150–400)
RBC: 4.13 MIL/uL — ABNORMAL LOW (ref 4.22–5.81)
RDW: 12.5 % (ref 11.5–15.5)
WBC: 6.9 10*3/uL (ref 4.0–10.5)
nRBC: 0 % (ref 0.0–0.2)

## 2019-12-19 LAB — CMP (CANCER CENTER ONLY)
ALT: 29 U/L (ref 0–44)
AST: 24 U/L (ref 15–41)
Albumin: 4.2 g/dL (ref 3.5–5.0)
Alkaline Phosphatase: 66 U/L (ref 38–126)
Anion gap: 10 (ref 5–15)
BUN: 18 mg/dL (ref 8–23)
CO2: 27 mmol/L (ref 22–32)
Calcium: 8.9 mg/dL (ref 8.9–10.3)
Chloride: 106 mmol/L (ref 98–111)
Creatinine: 0.8 mg/dL (ref 0.61–1.24)
GFR, Est AFR Am: >60 mL/min (ref >60)
GFR, Estimated: >60 mL/min (ref >60)
Glucose, Bld: 79 mg/dL (ref 70–99)
Potassium: 4.4 mmol/L (ref 3.5–5.1)
Sodium: 143 mmol/L (ref 135–145)
Total Bilirubin: 0.6 mg/dL (ref 0.3–1.2)
Total Protein: 7.1 g/dL (ref 6.5–8.1)

## 2019-12-19 LAB — VITAMIN B12: Vitamin B-12: 882 pg/mL (ref 180–914)

## 2019-12-20 LAB — FOLATE RBC
Folate, Hemolysate: 390 ng/mL
Folate, RBC: 956 ng/mL (ref >498)
Hematocrit: 40.8 % (ref 37.5–51.0)

## 2019-12-23 LAB — MULTIPLE MYELOMA PANEL, SERUM
Albumin SerPl Elph-Mcnc: 4.1 g/dL (ref 2.9–4.4)
Albumin/Glob SerPl: 1.5 (ref 0.7–1.7)
Alpha 1: 0.2 g/dL (ref 0.0–0.4)
Alpha2 Glob SerPl Elph-Mcnc: 0.6 g/dL (ref 0.4–1.0)
B-Globulin SerPl Elph-Mcnc: 0.8 g/dL (ref 0.7–1.3)
Gamma Glob SerPl Elph-Mcnc: 1.2 g/dL (ref 0.4–1.8)
Globulin, Total: 2.8 g/dL (ref 2.2–3.9)
IgA: 93 mg/dL (ref 61–437)
IgG (Immunoglobin G), Serum: 639 mg/dL (ref 603–1613)
IgM (Immunoglobulin M), Srm: 763 mg/dL — ABNORMAL HIGH (ref 15–143)
M Protein SerPl Elph-Mcnc: 0.7 g/dL — ABNORMAL HIGH
Total Protein ELP: 6.9 g/dL (ref 6.0–8.5)

## 2019-12-24 ENCOUNTER — Telehealth: Payer: Self-pay

## 2019-12-24 NOTE — Telephone Encounter (Signed)
Received call from patient confirming that he will be at his appt. With Dr. Irene Limbo on Thursday 12/26/19 at 2:20 PM.

## 2019-12-25 ENCOUNTER — Other Ambulatory Visit: Payer: Self-pay | Admitting: Internal Medicine

## 2019-12-25 NOTE — Progress Notes (Signed)
HEMATOLOGY/ONCOLOGY CLINIC NOTE  Date of Service: 12/26/2019  Patient Care Team: [name], DO as PCP - General (Geriatric Medicine)  CHIEF COMPLAINTS/PURPOSE OF CONSULTATION:   IgM MGUS  HISTORY OF PRESENTING ILLNESS:   Antonio [name] is a wonderful 79 y.o. male who has been referred to [place] by Dr. Hollace Kinnier for evaluation and management of Bone lesion of left lower leg. The pt reports that he is doing well overall.   The pt reports that he cannot feel the ovoid finding from his 04/09/18 MRI as noted below. He denies being able to feel anything in his skin as well. He notes that he received a needle injection in his knee prior to this MRI. The pt notes that his knee has lost much cartilage and is considering a knee replacement. He denies any recent injuries to his knee or falls. He has contacted Dr. Alphonsa Overall in surgery and another surgeon as well and will be making an appointment.   The pt notes that he does not feel any differently recently as compared to 6 months to a year ago. He does note that he normally has constipation in the spring, and has continued to have mild constipation. He denies any other concerns or symptoms.   The pt previously saw my colleague Dr. Lurline Del for primary testicular B-cell lymphoma in 2000. The pt notes that the involvement was limited to only one testicle. He received 6 intrathecal treatments in his spine, and received 4 cycles of chemotherapy (thinks this was CHOP) and radiation as well. The pt was followed by an oncologist for several years at the [facility] in [place] after moving from [place] after his [place] treatment.   The pt notes that he was first diagnosed with melanoma in October 2000. He noticed a strange spot on his left leg that was surgically resected. The pt's personal notes report a 2.9cm thick, level 4, no LN involvement, high risk stage II, treated with Interferon three times a week for  one year. He notes some squamous cell and basal cell involvements as well and sees a dermatologist, Dr. Wilhemina Bonito, regularly.   Of note prior to the patient's visit today, pt has had MRI Left Knee completed on 04/09/18 with results revealing An ovoid, lobulated, 51mm T2 hyperintense focus within the medial subcutaneous far 1.78mm distal to the joint demonstrates demonstrates nonspecific imaging characteristics.   Most recent lab results (03/01/18) of CBC w/diff is as follows: all values are WNL except for RBC at 4.54, MCV at 100.4, MCH at 33.8.   On review of systems, pt reports left knee pain, mild constipation, and denies new or concerning symptoms, noticing any new lumps or bumps, pain along the spine, abdominal pains, problems passing urine, testicular pain/swelling, and any other symptoms.   Interval History:   Antonio [name] returns today for management and evaluation of his MGUS. The patient's last visit with [place] was on 06/11/2019. The pt reports that he is doing well overall.  The pt reports he is doing good. He has no new concerns over the last 6 months. Pt received both doses of COVID19 vaccine.   Lab results today (12/19/19) of CBC w/diff and CMP is as follows: all values are WNL except for RBC at 4.13, MCV at 103.6. 12/19/2019 of Folate RBC: all values are WNL 12/19/2019 of Vitamin B12 at 882 12/19/2019 of MMP: all values are WNL except: IgM at 763, M Protein SerPl Elph-Mcnc at  0.7  On review of systems, pt reports denies abdominal pain, fever, chills, unexpected weight loss, night sweats, bone pain and any other symptoms.    MEDICAL HISTORY:  Past Medical History:  Diagnosis Date  . B-cell lymphoma ([place])    testicular diffuse large b-cell lymphoma  . BCC (basal cell carcinoma of skin)   . Carotid stenosis, asymptomatic, left   . Elevated diaphragm    right  . Erectile dysfunction   . Hyperlipidemia   . Hypertension   . Kidney stone   . Lymphoma of testis ([place])   . Macular  degeneration    Dry ARMD OU  . Melanoma ([place])   . Melanoma ([place])   . Posterior vitreous detachment   . SCC (squamous cell carcinoma)   . Status post joint replacement    right TKA    SURGICAL HISTORY: Past Surgical History:  Procedure Laterality Date  . CATARACT EXTRACTION Bilateral 01/2017   Dr. Merril Abbe [facility] [place]   . CATARACT EXTRACTION W/ INTRAOCULAR LENS IMPLANT Bilateral 2018  . EYE SURGERY    . MELANOMA EXCISION  10/1999  . TOTAL KNEE ARTHROPLASTY  06/2009    SOCIAL HISTORY: Social History   Socioeconomic History  . Marital status: Married    Spouse name: Not on file  . Number of children: Not on file  . Years of education: Not on file  . Highest education level: Not on file  Occupational History  . Not on file  Tobacco Use  . Smoking status: Never Smoker  . Smokeless tobacco: Never Used  Substance and Sexual Activity  . Alcohol use: Yes    Comment: 7 to 9 drinks per week  . Drug use: No  . Sexual activity: Not Currently  Other Topics Concern  . Not on file  Social History Narrative   Social History      Diet?good      Do you drink/eat things with caffeine? seldom      Marital status?          yes                          What year were you married? 1962      Do you live in a house, apartment, assisted living, condo, trailer, etc.? house      Is it one or more stories?1      How many persons live in your home? 2      Do you have any pets in your home? (please list) no      Highest level of education completed? college      Current or past profession: president and [place]      Do you exercise?   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 yes      Do you have a DNR form?       yes                            If not, do you want to discuss one?      Do you have signed POA/HPOA  for forms?  yes      Functional Status      Do you have  difficulty bathing or dressing yourself? no      Do you have difficulty preparing food or eating? no      Do you have difficulty managing your medications? no      Do you have difficulty managing your finances? no      Do you have difficulty affording your medications? no   Social Determinants of Health   Financial Resource Strain:   . Difficulty of Paying Living Expenses:   Food Insecurity:   . Worried About Charity fundraiser in the Last Year:   . Arboriculturist in the Last Year:   Transportation Needs:   . Film/video editor (Medical):   Marland Kitchen Lack of Transportation (Non-Medical):   Physical Activity:   . Days of Exercise per Week:   . Minutes of Exercise per Session:   Stress:   . Feeling of Stress :   Social Connections:   . Frequency of Communication with Friends and Family:   . Frequency of Social Gatherings with Friends and Family:   . Attends Religious Services:   . Active Member of Clubs or Organizations:   . Attends Archivist Meetings:   Marland Kitchen Marital Status:   Intimate Partner Violence:   . Fear of Current or Ex-Partner:   . Emotionally Abused:   Marland Kitchen Physically Abused:   . Sexually Abused:     FAMILY HISTORY: Family History  Problem Relation Age of Onset  . Cataracts Mother   . Transient ischemic attack Mother   . Asthma Mother   . Cataracts Father   . Diabetes Father        borderline  . Hypertension Father   . Hyperlipidemia Father   . Hyperlipidemia Son   . Hypertension Son   . Amblyopia Neg Hx   . Blindness Neg Hx   . Glaucoma Neg Hx   . Macular degeneration Neg Hx   . Retinal detachment Neg Hx   . Strabismus Neg Hx   . Retinitis pigmentosa Neg Hx     ALLERGIES:  has No Known Allergies.  MEDICATIONS:  Current Outpatient Medications  Medication Sig Dispense Refill  . Cholecalciferol (VITAMIN D3) 2000 units TABS Take 1 tablet by mouth daily.    . Cyanocobalamin (B-12 PO) Take 1 tablet by mouth daily.    Marland Kitchen losartan (COZAAR) 100  MG tablet Take 1 tablet (100 mg total) by mouth daily. 90 tablet 1  . meloxicam (MOBIC) 7.5 MG tablet Take 1 tablet (7.5 mg total) by mouth 2 (two) times daily as needed for pain. 180 tablet 0  . montelukast (SINGULAIR) 10 MG tablet TAKE 1 TABLET BY MOUTH EVERYDAY AT BEDTIME 90 tablet 1  . Multiple Vitamins-Minerals (PRESERVISION AREDS 2 PO) Take 1 capsule by mouth 2 (two) times daily.     . simvastatin (ZOCOR) 40 MG tablet TAKE 1 TABLET BY MOUTH EVERY DAY 90 tablet 1   No current facility-administered medications for this visit.    REVIEW OF SYSTEMS:    A 10+ POINT REVIEW OF SYSTEMS WAS OBTAINED including neurology, dermatology, psychiatry, cardiac, respiratory, lymph, extremities, GI, GU, Musculoskeletal, constitutional, breasts, reproductive, HEENT.  All pertinent positives are noted in the HPI.  All others are negative.    PHYSICAL EXAMINATION: ECOG PERFORMANCE STATUS: 0 - Asymptomatic VS reviewed - stable  Vitals:   12/26/19 1446  BP: 134/82  Pulse: 87  Resp: 16  Temp: 97.8 F (36.6 C)  SpO2: 97%   Wt Readings from Last 3 Encounters:  12/26/19 193 lb 9.6 oz (87.8 kg)  09/03/19 190 lb 9.6 oz (86.5 kg)  08/14/19 190 lb 9.6 oz (86.5 kg)   Body mass index is 26.26 kg/m.    GENERAL:alert, in no acute distress and comfortable SKIN: no acute rashes, no significant lesions EYES: conjunctiva are pink and non-injected, sclera anicteric OROPHARYNX: MMM, no exudates, no oropharyngeal erythema or ulceration NECK: supple, no JVD LYMPH:  no palpable lymphadenopathy in the cervical, axillary or inguinal regions LUNGS: clear to auscultation b/l with normal respiratory effort HEART: regular rate & rhythm ABDOMEN:  normoactive bowel sounds , non tender, not distended. Extremity: no pedal edema PSYCH: alert & oriented x 3 with fluent speech NEURO: no focal motor/sensory deficits  LABORATORY DATA:  I have reviewed the data as listed  . CBC Latest Ref Rng & Units 12/19/2019 12/19/2019  06/04/2019  WBC 4.0 - 10.5 K/uL 6.9 - 6.2  Hemoglobin 13.0 - 17.0 g/dL 13.9 - 12.8(L)  Hematocrit 37.5 - 51.0 % 42.8 40.8 39.2  Platelets 150 - 400 K/uL 204 - 200   . CBC    Component Value Date/Time   WBC 6.9 12/19/2019 1057   RBC 4.13 (L) 12/19/2019 1057   HGB 13.9 12/19/2019 1057   HCT 40.8 12/19/2019 1057   HCT 42.8 12/19/2019 1057   PLT 204 12/19/2019 1057   MCV 103.6 (H) 12/19/2019 1057   MCH 33.7 12/19/2019 1057   MCHC 32.5 12/19/2019 1057   RDW 12.5 12/19/2019 1057   LYMPHSABS 1.8 12/19/2019 1057   MONOABS 0.7 12/19/2019 1057   EOSABS 0.1 12/19/2019 1057   BASOSABS 0.1 12/19/2019 1057    . CMP Latest Ref Rng & Units 12/19/2019 06/04/2019 11/12/2018  Glucose 70 - 99 mg/dL 79 85 101(H)  BUN 8 - 23 mg/dL 18 24(H) 14  Creatinine 0.61 - 1.24 mg/dL 0.80 0.80 0.80  Sodium 135 - 145 mmol/L 143 142 143  Potassium 3.5 - 5.1 mmol/L 4.4 4.7 4.1  Chloride 98 - 111 mmol/L 106 108 106  CO2 22 - 32 mmol/L 27 24 30   Calcium 8.9 - 10.3 mg/dL 8.9 9.1 9.6  Total Protein 6.5 - 8.1 g/dL 7.1 6.6 7.7  Total Bilirubin 0.3 - 1.2 mg/dL 0.6 0.6 0.9  Alkaline Phos 38 - 126 U/L 66 63 72  AST 15 - 41 U/L 24 23 22   ALT 0 - 44 U/L 29 31 32     Component     Latest Ref Rng & Units 12/19/2019  Folate, Hemolysate     Not Estab. ng/mL 390.0  HCT     37.5 - 51.0 % 40.8  Folate, RBC     >498 ng/mL 956  Vitamin B12     180 - 914 pg/mL 882    03/01/18 CBC w/diff:    RADIOGRAPHIC STUDIES: I have personally reviewed the radiological images as listed and agreed with the findings in the report. No results found.  05/17/18 Left Knee Biopsy:    04/09/18 MRI Left Knee:     ASSESSMENT & PLAN:   79 y.o. male with  1. Ovoid lesion near the medial aspect of  of left knee ? Injection granuloma vs other etiology for lesion. however given previous h/o melanoma and lymphoma will need to be cautious and try to wotrk this up.  04/09/18 MRI of Left Knee revealed An ovoid, lobulated, 27mm T2  hyperintense focus  within the medial subcutaneous far 1.25mm distal to the joint demonstrates demonstrates nonspecific imaging characteristics.   05/17/18 US guided Soft Tissue Biopsy of the Left Knee did not identify any malignant cells and revealed inflammatory cells.  2. MGUS- IgM kappa monoclonal paraproteinemia 05/02/18 MMP revealed M Protein at 0.5 with immunofixation revealing IgM with kappa light chains  3/ h/o Melanoma and DLBCL  PLAN -Discussed pt labwork today, 12/19/19; of CBC w/diff and CMP is as follows: all values are WNL except for RBC at 4.13, MCV at 103.6. -Discussed 12/19/2019 of Folate RBC: all values are WNL -Discussed 12/19/2019 of Vitamin B12 at 882 -Discussed 12/19/2019 of MMP: all values are WNL except: IgM at 763, M Protein SerPl Elph-Mcnc at 0.7 -Advised on IgM related to slow growing lymphoplasmacytic lymphoma -Recommends Vitamin B-complex -Recommended f/u once a year  -No new signs of progression and labs showing stability  -If the pt develops new unexplained fatigue, fevers, or chills, he will let me know in the interim and I will see him sooner -Continue f/u with Dermatologist  -Will see the pt back in 6 months.  FOLLOW UP: RTC with Dr Irene Limbo with labs in 12 months. Plz schedule labs 1 week prior to clinic visit   The total time spent in the appt was 15 minutes and more than 50% was on counseling and direct patient cares.  All of the patient's questions were answered with apparent satisfaction. The patient knows to call the clinic with any problems, questions or concerns.   Sullivan Lone MD [place] AAHIVMS [facility] Hematology/Oncology Physician [facility]  (Office):       [phone] (Work cell):  [phone] (Fax):           [phone]  12/26/2019 2:59 PM  I, Dawayne Cirri am acting as a Education administrator for Dr. Sullivan Lone.   .I have reviewed the above documentation for accuracy and completeness, and I agree with the above. Brunetta Genera MD

## 2019-12-26 ENCOUNTER — Other Ambulatory Visit: Payer: Self-pay

## 2019-12-26 ENCOUNTER — Inpatient Hospital Stay (HOSPITAL_BASED_OUTPATIENT_CLINIC_OR_DEPARTMENT_OTHER): Payer: PPO | Admitting: Hematology

## 2019-12-26 VITALS — BP 134/82 | HR 87 | Temp 97.8°F | Resp 16 | Ht 72.0 in | Wt 193.6 lb

## 2019-12-26 DIAGNOSIS — D7589 Other specified diseases of blood and blood-forming organs: Secondary | ICD-10-CM

## 2019-12-26 DIAGNOSIS — M899 Disorder of bone, unspecified: Secondary | ICD-10-CM | POA: Diagnosis not present

## 2019-12-26 DIAGNOSIS — D472 Monoclonal gammopathy: Secondary | ICD-10-CM

## 2019-12-27 DIAGNOSIS — M25562 Pain in left knee: Secondary | ICD-10-CM | POA: Diagnosis not present

## 2019-12-27 DIAGNOSIS — M25561 Pain in right knee: Secondary | ICD-10-CM | POA: Diagnosis not present

## 2019-12-27 NOTE — Progress Notes (Signed)
Late entry on 12/27/19 @ 1335

## 2020-01-20 DIAGNOSIS — L570 Actinic keratosis: Secondary | ICD-10-CM | POA: Diagnosis not present

## 2020-01-20 DIAGNOSIS — L821 Other seborrheic keratosis: Secondary | ICD-10-CM | POA: Diagnosis not present

## 2020-01-20 DIAGNOSIS — Z85828 Personal history of other malignant neoplasm of skin: Secondary | ICD-10-CM | POA: Diagnosis not present

## 2020-01-20 DIAGNOSIS — Z85820 Personal history of malignant melanoma of skin: Secondary | ICD-10-CM | POA: Diagnosis not present

## 2020-01-20 DIAGNOSIS — L820 Inflamed seborrheic keratosis: Secondary | ICD-10-CM | POA: Diagnosis not present

## 2020-02-12 ENCOUNTER — Other Ambulatory Visit: Payer: Self-pay | Admitting: Internal Medicine

## 2020-02-12 DIAGNOSIS — G8911 Acute pain due to trauma: Secondary | ICD-10-CM

## 2020-02-12 NOTE — Telephone Encounter (Signed)
rx sent to pharmacy by e-script  

## 2020-02-17 ENCOUNTER — Telehealth: Payer: Self-pay | Admitting: *Deleted

## 2020-02-17 MED ORDER — LOSARTAN POTASSIUM 100 MG PO TABS: 100.0000 mg | ORAL_TABLET | Freq: Every day | ORAL | 1 refills | Status: DC

## 2020-02-17 NOTE — Telephone Encounter (Signed)
Patient called requesting refill on Valacyclovir 1gram tablet Take one tablet by mouth twice daily for 3 days for flares.   Medication is not in current medication list but has been prescribed by [place]. Patient is requesting a refill with #30 to be sent to his pharmacy.  (routed message to Dinah due to Dr. Mariea Clonts out of office)  Please Advise.

## 2020-02-17 NOTE — Telephone Encounter (Signed)
LMOM to return call.

## 2020-02-17 NOTE — Telephone Encounter (Signed)
If having any flare ups will need a visit for evaluation prior to refill of Valacyclovir.

## 2020-02-18 MED ORDER — VALACYCLOVIR HCL 1 G PO TABS: ORAL_TABLET | ORAL | 1 refills | Status: DC

## 2020-02-18 NOTE — Telephone Encounter (Signed)
LMOM to return call.

## 2020-02-18 NOTE — Telephone Encounter (Signed)
Patient notified and stated that's bologna. Stated that he has been dealing with this for over 60 years now and he does not need an appointment.  Wants Dr. Cyndi Lennert opinion. Needs Rx before Thursday. Please Advise.

## 2020-02-18 NOTE — Telephone Encounter (Signed)
Ok to refill.  He has an appt later this month already scheduled.

## 2020-02-18 NOTE — Telephone Encounter (Signed)
Patient notified and agreed. Rx sent to Pharmacy.

## 2020-02-25 ENCOUNTER — Other Ambulatory Visit: Payer: Self-pay | Admitting: Hematology

## 2020-02-25 ENCOUNTER — Telehealth: Payer: Self-pay | Admitting: *Deleted

## 2020-02-25 DIAGNOSIS — N5089 Other specified disorders of the male genital organs: Secondary | ICD-10-CM

## 2020-02-25 NOTE — Telephone Encounter (Addendum)
Called - very concerned and wants to be seen. He said Dr. Irene Limbo told him to call if anything happened and this is how lymphoma was diagnosed first time. Testicle increasingly swollen - started last week, now more pronounced. No pain in testicle or abdomen, no redness and no fever. Wants to be seen as soon as possible. Dr. Irene Limbo informed. Dr.Kale's response: Patient to come in tomorrow for labs and MD appt. He will order Ultrasound to be done prior to appt if possible Contacted patient with this information. Appt scheduled for lab 5/12 at 9am and MD at 9:20am. [place] ordered - scheduled for 5/12 at 0730. Patient verbalized understanding of all appt times.

## 2020-02-26 ENCOUNTER — Inpatient Hospital Stay: Payer: PPO | Attending: Hematology | Admitting: Hematology

## 2020-02-26 ENCOUNTER — Other Ambulatory Visit: Payer: Self-pay

## 2020-02-26 ENCOUNTER — Ambulatory Visit (HOSPITAL_COMMUNITY)
Admission: RE | Admit: 2020-02-26 | Discharge: 2020-02-26 | Disposition: A | Discharge status: Home / Self Care | Payer: PPO | Source: Ambulatory Visit | Attending: Hematology | Admitting: Hematology

## 2020-02-26 ENCOUNTER — Inpatient Hospital Stay: Payer: PPO

## 2020-02-26 VITALS — BP 141/94 | HR 77 | Temp 97.8°F | Resp 20 | Wt 191.3 lb

## 2020-02-26 DIAGNOSIS — I10 Essential (primary) hypertension: Secondary | ICD-10-CM | POA: Diagnosis not present

## 2020-02-26 DIAGNOSIS — N509 Disorder of male genital organs, unspecified: Secondary | ICD-10-CM | POA: Diagnosis not present

## 2020-02-26 DIAGNOSIS — D472 Monoclonal gammopathy: Secondary | ICD-10-CM | POA: Diagnosis not present

## 2020-02-26 DIAGNOSIS — C8339 Diffuse large B-cell lymphoma, extranodal and solid organ sites: Secondary | ICD-10-CM

## 2020-02-26 DIAGNOSIS — D4959 Neoplasm of unspecified behavior of other genitourinary organ: Secondary | ICD-10-CM | POA: Diagnosis not present

## 2020-02-26 DIAGNOSIS — M899 Disorder of bone, unspecified: Secondary | ICD-10-CM | POA: Diagnosis not present

## 2020-02-26 DIAGNOSIS — D7589 Other specified diseases of blood and blood-forming organs: Secondary | ICD-10-CM

## 2020-02-26 DIAGNOSIS — Z85820 Personal history of malignant melanoma of skin: Secondary | ICD-10-CM | POA: Insufficient documentation

## 2020-02-26 DIAGNOSIS — N5089 Other specified disorders of the male genital organs: Secondary | ICD-10-CM

## 2020-02-26 LAB — CBC WITH DIFFERENTIAL/PLATELET
Abs Immature Granulocytes: 0.02 10*3/uL (ref 0.00–0.07)
Basophils Absolute: 0 10*3/uL (ref 0.0–0.1)
Basophils Relative: 1 %
Eosinophils Absolute: 0.1 10*3/uL (ref 0.0–0.5)
Eosinophils Relative: 1 %
HCT: 43.7 % (ref 39.0–52.0)
Hemoglobin: 14.2 g/dL (ref 13.0–17.0)
Immature Granulocytes: 0 %
Lymphocytes Relative: 20 %
Lymphs Abs: 1.1 10*3/uL (ref 0.7–4.0)
MCH: 33.1 pg (ref 26.0–34.0)
MCHC: 32.5 g/dL (ref 30.0–36.0)
MCV: 101.9 fL — ABNORMAL HIGH (ref 80.0–100.0)
Monocytes Absolute: 0.5 10*3/uL (ref 0.1–1.0)
Monocytes Relative: 9 %
Neutro Abs: 3.8 10*3/uL (ref 1.7–7.7)
Neutrophils Relative %: 69 %
Platelets: 207 10*3/uL (ref 150–400)
RBC: 4.29 MIL/uL (ref 4.22–5.81)
RDW: 12.7 % (ref 11.5–15.5)
WBC: 5.4 10*3/uL (ref 4.0–10.5)
nRBC: 0 % (ref 0.0–0.2)

## 2020-02-26 LAB — CMP (CANCER CENTER ONLY)
ALT: 27 U/L (ref 0–44)
AST: 23 U/L (ref 15–41)
Albumin: 4.3 g/dL (ref 3.5–5.0)
Alkaline Phosphatase: 65 U/L (ref 38–126)
Anion gap: 12 (ref 5–15)
BUN: 15 mg/dL (ref 8–23)
CO2: 26 mmol/L (ref 22–32)
Calcium: 9.5 mg/dL (ref 8.9–10.3)
Chloride: 105 mmol/L (ref 98–111)
Creatinine: 0.82 mg/dL (ref 0.61–1.24)
GFR, Est AFR Am: >60 mL/min (ref >60)
GFR, Estimated: >60 mL/min (ref >60)
Glucose, Bld: 107 mg/dL — ABNORMAL HIGH (ref 70–99)
Potassium: 4.7 mmol/L (ref 3.5–5.1)
Sodium: 143 mmol/L (ref 135–145)
Total Bilirubin: 0.7 mg/dL (ref 0.3–1.2)
Total Protein: 7.4 g/dL (ref 6.5–8.1)

## 2020-02-26 LAB — LACTATE DEHYDROGENASE: LDH: 161 U/L (ref 98–192)

## 2020-02-26 NOTE — Progress Notes (Signed)
HEMATOLOGY/ONCOLOGY CLINIC NOTE  Date of Service: 02/26/2020  Patient Care Team: [name], DO as PCP - General (Geriatric Medicine)  CHIEF COMPLAINTS/PURPOSE OF CONSULTATION:  New testicular mass IgM MGUS  HISTORY OF PRESENTING ILLNESS:   Antonio [name] is a wonderful 79 y.o. male who has been referred to [place] by Dr. Hollace Kinnier for evaluation and management of Bone lesion of left lower leg. The pt reports that he is doing well overall.   The pt reports that he cannot feel the ovoid finding from his 04/09/18 MRI as noted below. He denies being able to feel anything in his skin as well. He notes that he received a needle injection in his knee prior to this MRI. The pt notes that his knee has lost much cartilage and is considering a knee replacement. He denies any recent injuries to his knee or falls. He has contacted Dr. Alphonsa Overall in surgery and another surgeon as well and will be making an appointment.   The pt notes that he does not feel any differently recently as compared to 6 months to a year ago. He does note that he normally has constipation in the spring, and has continued to have mild constipation. He denies any other concerns or symptoms.   The pt previously saw my colleague Dr. Lurline Del for primary testicular B-cell lymphoma in 2000. The pt notes that the involvement was limited to only one testicle. He received 6 intrathecal treatments in his spine, and received 4 cycles of chemotherapy (thinks this was CHOP) and radiation as well. The pt was followed by an oncologist for several years at the [facility] in [place] after moving from [place] after his [place] treatment.   The pt notes that he was first diagnosed with melanoma in October 2000. He noticed a strange spot on his left leg that was surgically resected. The pt's personal notes report a 2.9cm thick, level 4, no LN involvement, high risk stage II, treated with Interferon three  times a week for one year. He notes some squamous cell and basal cell involvements as well and sees a dermatologist, Dr. Wilhemina Bonito, regularly.   Of note prior to the patient's visit today, pt has had MRI Left Knee completed on 04/09/18 with results revealing An ovoid, lobulated, 73mm T2 hyperintense focus within the medial subcutaneous far 1.56mm distal to the joint demonstrates demonstrates nonspecific imaging characteristics.   Most recent lab results (03/01/18) of CBC w/diff is as follows: all values are WNL except for RBC at 4.54, MCV at 100.4, MCH at 33.8.   On review of systems, pt reports left knee pain, mild constipation, and denies new or concerning symptoms, noticing any new lumps or bumps, pain along the spine, abdominal pains, problems passing urine, testicular pain/swelling, and any other symptoms.   Interval History:   Antonio [name] returns today for management and evaluation evaluation of new testicular mass. The patient's last visit with [place] was on 12/26/2019. The pt reports that he is doing well overall.  The pt reports that he has felt well but has recently noticed left testicular swelling. He believes that the change in size occurred over 3-4 weeks. He denies any pain, injury to the area, or changes to the scrotum.   Pt had a right-sided Orchiectomy, 6 cycles of CHOP chemotherapy, intrathecal chemotherapy, and RT to his left testicle for his previous Testicular Diffuse Large B-cell Lymphoma. (around 2000)  Of note since the patient's last  visit, pt has had US Scrotum ([id number]) completed on 02/26/2020 with results revealing "1. Solid 2.5 cm left intratesticular mass with internal vascularity. Differential includes testicular lymphoma, testicular carcinoma, or less likely hematoma if there is history of recent trauma. Urology consultation is recommended. 2. Prior right orchiectomy."  Lab results today (02/26/20) of CBC w/diff and CMP is as follows: all values are WNL except for MCV  at 101.9, Glucose at 107. 02/26/2020 LDH at 161 02/26/2020 MMP is in progress  On review of systems, pt reports testicular swelling and denies testicular pain, fevers, chills, night sweats, new lumps/bumps, urinary habit changes, bowel habit changes and any other symptoms.   MEDICAL HISTORY:  Past Medical History:  Diagnosis Date  . B-cell lymphoma ([place])    testicular diffuse large b-cell lymphoma  . BCC (basal cell carcinoma of skin)   . Carotid stenosis, asymptomatic, left   . Elevated diaphragm    right  . Erectile dysfunction   . Hyperlipidemia   . Hypertension   . Kidney stone   . Lymphoma of testis ([place])   . Macular degeneration    Dry ARMD OU  . Melanoma ([facility])   . Melanoma ([place])   . Posterior vitreous detachment   . SCC (squamous cell carcinoma)   . Status post joint replacement    right TKA    SURGICAL HISTORY: Past Surgical History:  Procedure Laterality Date  . CATARACT EXTRACTION Bilateral 01/2017   Dr. Merril Abbe [facility] [place])   . CATARACT EXTRACTION W/ INTRAOCULAR LENS IMPLANT Bilateral 2018  . EYE SURGERY    . MELANOMA EXCISION  10/1999  . TOTAL KNEE ARTHROPLASTY  06/2009    SOCIAL HISTORY: Social History   Socioeconomic History  . Marital status: Married    Spouse name: Not on file  . Number of children: Not on file  . Years of education: Not on file  . Highest education level: Not on file  Occupational History  . Not on file  Tobacco Use  . Smoking status: Never Smoker  . Smokeless tobacco: Never Used  Substance and Sexual Activity  . Alcohol use: Yes    Comment: 7 to 9 drinks per week  . Drug use: No  . Sexual activity: Not Currently  Other Topics Concern  . Not on file  Social History Narrative   Social History      Diet?good      Do you drink/eat things with caffeine? seldom      Marital status?          yes                          What year were you married? 1962      Do you live in a house, apartment, assisted living,  condo, trailer, etc.? house      Is it one or more stories?1      How many persons live in your home? 2      Do you have any pets in your home? (please list) no      Highest level of education completed? college      Current or past profession: president and [employer]      Do you exercise?   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 yes      Do you have a DNR form?       yes                            If not, do you want to discuss one?      Do you have signed POA/HPOA for forms?  yes      Functional Status      Do you have difficulty bathing or dressing yourself? no      Do you have difficulty preparing food or eating? no      Do you have difficulty managing your medications? no      Do you have difficulty managing your finances? no      Do you have difficulty affording your medications? no   Social Determinants of Health   Financial Resource Strain:   . Difficulty of Paying Living Expenses:   Food Insecurity:   . Worried About Charity fundraiser in the Last Year:   . Arboriculturist in the Last Year:   Transportation Needs:   . Film/video editor (Medical):   Marland Kitchen Lack of Transportation (Non-Medical):   Physical Activity:   . Days of Exercise per Week:   . Minutes of Exercise per Session:   Stress:   . Feeling of Stress :   Social Connections:   . Frequency of Communication with Friends and Family:   . Frequency of Social Gatherings with Friends and Family:   . Attends Religious Services:   . Active Member of Clubs or Organizations:   . Attends Archivist Meetings:   Marland Kitchen Marital Status:   Intimate Partner Violence:   . Fear of Current or Ex-Partner:   . Emotionally Abused:   Marland Kitchen Physically Abused:   . Sexually Abused:     FAMILY HISTORY: Family History  Problem Relation Age of Onset  . Cataracts Mother   . Transient ischemic attack Mother     . Asthma Mother   . Cataracts Father   . Diabetes Father        borderline  . Hypertension Father   . Hyperlipidemia Father   . Hyperlipidemia Son   . Hypertension Son   . Amblyopia Neg Hx   . Blindness Neg Hx   . Glaucoma Neg Hx   . Macular degeneration Neg Hx   . Retinal detachment Neg Hx   . Strabismus Neg Hx   . Retinitis pigmentosa Neg Hx     ALLERGIES:  has No Known Allergies.  MEDICATIONS:  Current Outpatient Medications  Medication Sig Dispense Refill  . Cholecalciferol (VITAMIN D3) 2000 units TABS Take 1 tablet by mouth daily.    . Cyanocobalamin (B-12 PO) Take 1 tablet by mouth daily.    Marland Kitchen losartan (COZAAR) 100 MG tablet Take 1 tablet (100 mg total) by mouth daily. 90 tablet 1  . meloxicam (MOBIC) 7.5 MG tablet TAKE 1 TABLET (7.5 MG TOTAL) BY MOUTH 2 (TWO) TIMES DAILY AS NEEDED FOR PAIN. 180 tablet 0  . montelukast (SINGULAIR) 10 MG tablet TAKE 1 TABLET BY MOUTH EVERYDAY AT BEDTIME 90 tablet 1  . Multiple Vitamins-Minerals (PRESERVISION AREDS 2 PO) Take 1 capsule by mouth 2 (two) times daily.     . simvastatin (ZOCOR) 40 MG tablet TAKE 1 TABLET BY MOUTH EVERY DAY 90 tablet  1  . valACYclovir (VALTREX) 1000 MG tablet Take one tablet by mouth twice daily for 3 days as needed for flares. 30 tablet 1   No current facility-administered medications for this visit.    REVIEW OF SYSTEMS:   A 10+ POINT REVIEW OF SYSTEMS WAS OBTAINED including neurology, dermatology, psychiatry, cardiac, respiratory, lymph, extremities, GI, GU, Musculoskeletal, constitutional, breasts, reproductive, HEENT.  All pertinent positives are noted in the HPI.  All others are negative.   PHYSICAL EXAMINATION: ECOG PERFORMANCE STATUS: 0 - Asymptomatic VS reviewed - stable  Vitals:   02/26/20 0922  BP: (!) 141/94  Pulse: 77  Resp: 20  Temp: 97.8 F (36.6 C)  SpO2: 99%   Wt Readings from Last 3 Encounters:  02/26/20 191 lb 4.8 oz (86.8 kg)  12/26/19 193 lb 9.6 oz (87.8 kg)  09/03/19 190 lb  9.6 oz (86.5 kg)   Body mass index is 25.94 kg/m.    GENERAL:alert, in no acute distress and comfortable, left testicular swelling SKIN: no acute rashes, no significant lesions EYES: conjunctiva are pink and non-injected, sclera anicteric OROPHARYNX: MMM, no exudates, no oropharyngeal erythema or ulceration NECK: supple, no JVD LYMPH:  no palpable lymphadenopathy in the cervical, axillary or inguinal regions LUNGS: clear to auscultation b/l with normal respiratory effort HEART: regular rate & rhythm ABDOMEN:  normoactive bowel sounds , non tender, not distended. No palpable hepatosplenomegaly.  Extremity: no pedal edema PSYCH: alert & oriented x 3 with fluent speech NEURO: no focal motor/sensory deficits  LABORATORY DATA:  I have reviewed the data as listed  . CBC Latest Ref Rng & Units 02/26/2020 12/19/2019 12/19/2019  WBC 4.0 - 10.5 K/uL 5.4 6.9 -  Hemoglobin 13.0 - 17.0 g/dL 14.2 13.9 -  Hematocrit 39.0 - 52.0 % 43.7 42.8 40.8  Platelets 150 - 400 K/uL 207 204 -   . CBC    Component Value Date/Time   WBC 5.4 02/26/2020 0901   RBC 4.29 02/26/2020 0901   HGB 14.2 02/26/2020 0901   HCT 43.7 02/26/2020 0901   HCT 40.8 12/19/2019 1057   PLT 207 02/26/2020 0901   MCV 101.9 (H) 02/26/2020 0901   MCH 33.1 02/26/2020 0901   MCHC 32.5 02/26/2020 0901   RDW 12.7 02/26/2020 0901   LYMPHSABS 1.1 02/26/2020 0901   MONOABS 0.5 02/26/2020 0901   EOSABS 0.1 02/26/2020 0901   BASOSABS 0.0 02/26/2020 0901    . CMP Latest Ref Rng & Units 02/26/2020 12/19/2019 06/04/2019  Glucose 70 - 99 mg/dL 107(H) 79 85  BUN 8 - 23 mg/dL 15 18 24(H)  Creatinine 0.61 - 1.24 mg/dL 0.82 0.80 0.80  Sodium 135 - 145 mmol/L 143 143 142  Potassium 3.5 - 5.1 mmol/L 4.7 4.4 4.7  Chloride 98 - 111 mmol/L 105 106 108  CO2 22 - 32 mmol/L 26 27 24   Calcium 8.9 - 10.3 mg/dL 9.5 8.9 9.1  Total Protein 6.5 - 8.1 g/dL 7.4 7.1 6.6  Total Bilirubin 0.3 - 1.2 mg/dL 0.7 0.6 0.6  Alkaline Phos 38 - 126 U/L 65 66 63    AST 15 - 41 U/L 23 24 23   ALT 0 - 44 U/L 27 29 31      Component     Latest Ref Rng & Units 12/19/2019  Folate, Hemolysate     Not Estab. ng/mL 390.0  HCT     37.5 - 51.0 % 40.8  Folate, RBC     >498 ng/mL 956  Vitamin B12  180 - 914 pg/mL 882    03/01/18 CBC w/diff:    RADIOGRAPHIC STUDIES: I have personally reviewed the radiological images as listed and agreed with the findings in the report. US SCROTUM W/DOPPLER  Result Date: 02/26/2020 CLINICAL DATA:  History of lymphoma.  Left testicular enlargement EXAM: SCROTAL ULTRASOUND DOPPLER ULTRASOUND OF THE TESTICLES TECHNIQUE: Complete ultrasound examination of the testicles, epididymis, and other scrotal structures was performed. Color and spectral Doppler ultrasound were also utilized to evaluate blood flow to the testicles. COMPARISON:  None. FINDINGS: Right testicle Surgically absent. Left testicle Measurements: 5.2 x 3.2 x 3.8 cm, volume 33 mL. Heterogeneously hypoechoic ovoid intratesticular mass with internal vascularity measuring 2.5 x 2.0 x 2.5 cm. Right epididymis:  Surgically absent. Left epididymis:  Normal in size and appearance. Hydrocele:  None visualized. Varicocele:  None visualized. Pulsed Doppler interrogation of the left testis demonstrates normal low resistance arterial and venous waveforms. IMPRESSION: 1. Solid 2.5 cm left intratesticular mass with internal vascularity. Differential includes testicular lymphoma, testicular carcinoma, or less likely hematoma if there is history of recent trauma. Urology consultation is recommended. 2. Prior right orchiectomy. These results will be called to the ordering clinician or representative by the Radiologist Assistant, and communication documented in the PACS or [employer]. Electronically Signed   By: Davina Poke D.O.   On: 02/26/2020 08:15    05/17/18 Left Knee Biopsy:    04/09/18 MRI Left Knee:     ASSESSMENT & PLAN:   79 y.o. male with  1. Ovoid lesion  near the medial aspect of the left knee ? Injection granuloma vs other etiology for lesion. however given previous h/o melanoma and lymphoma will need to be cautious and try to wotrk this up.  04/09/18 MRI of Left Knee revealed An ovoid, lobulated, 4mm T2 hyperintense focus within the medial subcutaneous far 1.53mm distal to the joint demonstrates demonstrates nonspecific imaging characteristics.   05/17/18 US guided Soft Tissue Biopsy of the Left Knee did not identify any malignant cells and revealed inflammatory cells.  2. MGUS- IgM kappa monoclonal paraproteinemia 05/02/18 MMP revealed M Protein at 0.5 with immunofixation revealing IgM with kappa light chains  3/ h/o Melanoma and DLBCL  PLAN -Discussed pt labwork today, 02/26/20; blood counts and chemistries are nml, LDH is WNL -Dicussed 02/26/2020 MMP is in progress -Discussed 02/26/2020 US Scrotum ([id number]) which revealed "1. Solid 2.5 cm left intratesticular mass with internal vascularity. Differential includes testicular lymphoma, testicular carcinoma, or less likely hematoma if there is history of recent trauma. Urology consultation is recommended. 2. Prior right orchiectomy." -No new signs of progression of MGUS and labs showing stability  -Discussed getting a needle biopsy of the testicular mass and the risk of seeding with that procedure.  -Advised pt that if the mass is malignant he may not be able to receive RT, as he has had RT to his left testicle before  -Advised pt that there are hormone replacement options if it is decided that he receive a second Orchiectomy  -Will urgently refer to Dr. Jeffie Pollock (Urology) for further evaluation of testicular mass  -Will get PET/CT in 5 days -Will see back in 2 weeks    FOLLOW UP: -Urgent urology referral to Dr Jeffie Pollock for testicular mass (h/o Primary testicular large B cell lymphoma) -PET/CT in 5 days -RTC with Dr Irene Limbo in 2 weeks    The total time spent in the appt was 40 minutes and  more than 50% was on counseling and direct patient cares.  All of the patient's questions were answered with apparent satisfaction. The patient knows to call the clinic with any problems, questions or concerns.    Sullivan Lone MD [place] AAHIVMS [facility] Hematology/Oncology Physician [facility]  (Office):       [phone] (Work cell):  [phone] (Fax):           [phone]  02/26/2020 10:04 AM  I, Yevette Edwards, am acting as a scribe for Dr. Sullivan Lone.   .I have reviewed the above documentation for accuracy and completeness, and I agree with the above. 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 Markedly hypermetabolic left testicular mass with probable adjacent involvement of the epididymis and spermatic cord as detailed above. 2. No hypermetabolic lymphadenopathy in the neck, chest, abdomen or pelvis.   Electronically Signed   By: Marijo Sanes M.D.   PLAN -urgent referral to Dr Jeffie Pollock.  Brunetta Genera MD

## 2020-02-27 DIAGNOSIS — E7800 Pure hypercholesterolemia, unspecified: Secondary | ICD-10-CM | POA: Diagnosis not present

## 2020-02-27 DIAGNOSIS — E119 Type 2 diabetes mellitus without complications: Secondary | ICD-10-CM | POA: Diagnosis not present

## 2020-02-27 DIAGNOSIS — E785 Hyperlipidemia, unspecified: Secondary | ICD-10-CM | POA: Diagnosis not present

## 2020-02-27 DIAGNOSIS — B7309 Onchocerciasis with other eye involvement: Secondary | ICD-10-CM | POA: Diagnosis not present

## 2020-02-27 LAB — LIPID PANEL
Cholesterol: 132 (ref 0–200)
HDL: 65 (ref 35–70)
LDL Cholesterol: 56
Triglycerides: 56 (ref 40–160)

## 2020-02-27 LAB — HEMOGLOBIN A1C: Hemoglobin A1C: 5.7

## 2020-03-02 ENCOUNTER — Other Ambulatory Visit: Payer: Self-pay

## 2020-03-02 ENCOUNTER — Ambulatory Visit (HOSPITAL_COMMUNITY)
Admission: RE | Admit: 2020-03-02 | Discharge: 2020-03-02 | Disposition: A | Discharge status: Home / Self Care | Payer: PPO | Source: Ambulatory Visit | Attending: Hematology | Admitting: Hematology

## 2020-03-02 DIAGNOSIS — E785 Hyperlipidemia, unspecified: Secondary | ICD-10-CM | POA: Insufficient documentation

## 2020-03-02 DIAGNOSIS — D4959 Neoplasm of unspecified behavior of other genitourinary organ: Secondary | ICD-10-CM | POA: Diagnosis not present

## 2020-03-02 DIAGNOSIS — Z79899 Other long term (current) drug therapy: Secondary | ICD-10-CM | POA: Diagnosis not present

## 2020-03-02 DIAGNOSIS — N509 Disorder of male genital organs, unspecified: Secondary | ICD-10-CM | POA: Diagnosis not present

## 2020-03-02 DIAGNOSIS — C8339 Diffuse large B-cell lymphoma, extranodal and solid organ sites: Secondary | ICD-10-CM

## 2020-03-02 DIAGNOSIS — I10 Essential (primary) hypertension: Secondary | ICD-10-CM | POA: Diagnosis not present

## 2020-03-02 DIAGNOSIS — N5089 Other specified disorders of the male genital organs: Secondary | ICD-10-CM | POA: Diagnosis not present

## 2020-03-02 LAB — MULTIPLE MYELOMA PANEL, SERUM
Albumin SerPl Elph-Mcnc: 4 g/dL (ref 2.9–4.4)
Albumin/Glob SerPl: 1.4 (ref 0.7–1.7)
Alpha 1: 0.2 g/dL (ref 0.0–0.4)
Alpha2 Glob SerPl Elph-Mcnc: 0.7 g/dL (ref 0.4–1.0)
B-Globulin SerPl Elph-Mcnc: 0.9 g/dL (ref 0.7–1.3)
Gamma Glob SerPl Elph-Mcnc: 1.2 g/dL (ref 0.4–1.8)
Globulin, Total: 3 g/dL (ref 2.2–3.9)
IgA: 88 mg/dL (ref 61–437)
IgG (Immunoglobin G), Serum: 678 mg/dL (ref 603–1613)
IgM (Immunoglobulin M), Srm: 798 mg/dL — ABNORMAL HIGH (ref 15–143)
M Protein SerPl Elph-Mcnc: 0.6 g/dL — ABNORMAL HIGH
Total Protein ELP: 7 g/dL (ref 6.0–8.5)

## 2020-03-02 LAB — GLUCOSE, CAPILLARY: Glucose-Capillary: 94 mg/dL (ref 70–99)

## 2020-03-02 MED ORDER — FLUDEOXYGLUCOSE F - 18 (FDG) INJECTION
9.5500 | Freq: Once | INTRAVENOUS | Status: AC | PRN
  Administered 2020-03-02: 9.55 via INTRAVENOUS

## 2020-03-03 ENCOUNTER — Other Ambulatory Visit: Payer: Self-pay | Admitting: Urology

## 2020-03-03 ENCOUNTER — Other Ambulatory Visit (HOSPITAL_COMMUNITY)
Admission: RE | Admit: 2020-03-03 | Discharge: 2020-03-03 | Disposition: A | Discharge status: Home / Self Care | Payer: PPO | Source: Ambulatory Visit | Attending: Urology | Admitting: Urology

## 2020-03-03 ENCOUNTER — Encounter (HOSPITAL_BASED_OUTPATIENT_CLINIC_OR_DEPARTMENT_OTHER): Payer: Self-pay | Admitting: Urology

## 2020-03-03 DIAGNOSIS — Z01812 Encounter for preprocedural laboratory examination: Secondary | ICD-10-CM | POA: Diagnosis not present

## 2020-03-03 DIAGNOSIS — Z20822 Contact with and (suspected) exposure to covid-19: Secondary | ICD-10-CM | POA: Insufficient documentation

## 2020-03-03 DIAGNOSIS — C6212 Malignant neoplasm of descended left testis: Secondary | ICD-10-CM | POA: Diagnosis not present

## 2020-03-03 LAB — SARS CORONAVIRUS 2 (TAT 6-24 HRS): SARS Coronavirus 2: NEGATIVE

## 2020-03-03 NOTE — Progress Notes (Addendum)
ADDENDUM:  Chart reviewed by anesthesia, Konrad Felix PA, ok to proceed.   Spoke w/ via phone for pre-op interview--- PT Lab needs dos---- Testosterone level              Lab results------ pt has current lab dated in epic 02-26-2020, CBCdiff/ CMP ;  Current ekg in epic/ chart COVID test ------ done today 03-03-2020 Arrive at ------- 0815 NPO after ------ MN Medications to take morning of surgery ----- NONE Diabetic medication ----- n/a Patient Special Instructions ----- n/a Pre-Op special Istructions ----- n/a Patient verbalized understanding of instructions that were given at this phone interview. Patient denies shortness of breath, chest pain, fever, cough a this phone interview.   Anesthesia : hx HTN, localized melanoma 2001 with no recurrence per pt. Hx Bcell lymphoma right testis s/p orchiectomy , chemo/ IMRT 2000.    PCP:  Dr Hollace Kinnier (lov 09-03-2019 epic) Oncologist:  Dr Irene Limbo (lov 02-26-2020 epic) Cardiologist : no Chest x-ray : 2002 epic EKG : 03-06-2019 epic Echo : no Stress test:  Pt stated had one approx. 2010, told normal Cardiac Cath :  no Sleep Study/ CPAP : Fasting Blood Sugar :      / Checks Blood Sugar -- times a day:   N/A Blood Thinner/ Instructions /Last Dose: NO ASA / Instructions/ Last Dose :  NO

## 2020-03-04 ENCOUNTER — Telehealth: Payer: Self-pay | Admitting: Internal Medicine

## 2020-03-04 NOTE — Telephone Encounter (Signed)
Patient called and stated that he is going to have surgery tomorrow.  He stated that all information pertaining to surgery is in My Chart.  He did not want to leave any other information with me.  Patient can be reached at [phone].  Thank you  Adan Sis

## 2020-03-04 NOTE — H&P (Signed)
I have swelling in my scrotum.  HPI: Antonio [name] is a 79 year-old male established patient who is here for scrotal swelling.  The mass is on the left side.   Antonio [name] returns with the onset a week ago of left testicular induration and had a scrotal US and was found to have a 2.5cm left testicular lesion that has enlarged further and a PET scan yesterday showed a hypermetabolic lesion in the testicle. He was sent back by Dr. Irene Limbo. He had no pain but has some discomfort today. 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 He has had no other complaints or problems.      ALLERGIES: None   MEDICATIONS: Simvastatin 40 mg tablet  Losartan Potassium 100 mg tablet  Meloxicam 7.5 mg tablet  Vitamin B12  Vitamin D3 50 mcg (2,000 unit) tablet     GU PSH: None     PSH Notes: melanoma removal-2000   Lymphoma surgery   NON-GU PSH: Knee replacement, Right     GU PMH: Nocturia, We discussed reducing fluids/wine in the evening. - 11/13/2019 BPH w/LUTS, He has LUTS with OAB symptoms. I discussed reducing dietary irritants and increasing water intake. He will return in 3 months for a flowrate and PVR. - 07/24/2019 Ureteral calculus, He appears to have passed the stone. - 07/24/2019 Urinary Urgency - 07/24/2019    NON-GU PMH: Anxiety Hypercholesterolemia Hypertension Lymphoma, History Malignant melanoma of skin, unspecified    FAMILY HISTORY: Death of family member - Father, Mother Kidney Stones - Brother    Notes: 1 son; 2 daughters   SOCIAL HISTORY: Marital Status: Married Preferred Language: English; Ethnicity: Not Hispanic Or Latino; Race: White Current Smoking Status: Patient has never smoked.   Tobacco Use Assessment Completed: Used Tobacco in last 30 days? Does not use smokeless tobacco. Drinks 2 drinks per day.  Drinks 1 caffeinated drink per day. Patient's occupation is/was retired. [employer]..    REVIEW OF SYSTEMS:    GU Review Male:   Patient reports get  up at night to urinate. Patient denies frequent urination, hard to postpone urination, burning/ pain with urination, leakage of urine, stream starts and stops, trouble starting your stream, have to strain to urinate , erection problems, and penile pain.  Gastrointestinal (Upper):   Patient denies nausea, vomiting, and indigestion/ heartburn.  Gastrointestinal (Lower):   Patient denies diarrhea and constipation.  Constitutional:   Patient denies fever, night sweats, weight loss, and fatigue.  Skin:   Patient denies skin rash/ lesion and itching.  Eyes:   Patient denies blurred vision and double vision.  Ears/ Nose/ Throat:   Patient denies sore throat and sinus problems.  Hematologic/Lymphatic:   Patient denies swollen glands and easy bruising.  Cardiovascular:   Patient denies leg swelling and chest pains.  Respiratory:   Patient denies cough and shortness of breath.  Endocrine:   Patient denies excessive thirst.  Musculoskeletal:   Patient denies back pain and joint pain.  Neurological:   Patient denies headaches and dizziness.  Psychologic:   Patient denies depression and anxiety.   VITAL SIGNS:      03/03/2020 08:31 AM  Weight 191 lb / 86.64 kg  Height 72 in / 182.88 cm  BP 126/78 mmHg  Heart Rate 85 /min  Temperature 97.5 F / 36.3 C  BMI 25.9 kg/m   GU PHYSICAL EXAMINATION:    Scrotum: No lesions. No edema. No cysts. No warts.  Epididymides: Right:absent. Left: Not clearly defined  because of testicular mass.   Testes: Absent right testis. Solid mass left testis about 5cm. No tenderness, no swelling, Normal location left testis. No cyst, no varicocele, no hydrocele left testis.   Urethral Meatus: Normal size. No lesion, no wart, no discharge, no polyp. Normal location.  Penis: Circumcised, no warts, no cracks. No dorsal Peyronie's plaques, no left corporal Peyronie's plaques, no right corporal Peyronie's plaques, no scarring, no warts. No balanitis, no meatal stenosis.    MULTI-SYSTEM PHYSICAL EXAMINATION:    Constitutional: Well-nourished. No physical deformities. Normally developed. Good grooming.  Neck: Neck symmetrical, not swollen. Normal tracheal position.  Respiratory: Normal breath sounds. No labored breathing, no use of accessory muscles.   Cardiovascular: Regular rate and rhythm. No murmur, no gallop.   Lymphatic: 2-3cm left inguinal node. No axillary or supraclavicular nodes.   Skin: No paleness, no jaundice, no cyanosis. No lesion, no ulcer, no rash.  Neurologic / Psychiatric: Oriented to time, oriented to place, oriented to person. No depression, no anxiety, no agitation.  Gastrointestinal: No mass, no tenderness, no rigidity, non obese abdomen.  Musculoskeletal: Normal gait and station of head and neck.     Complexity of Data:  Lab Test Review:   CBC with Diff, CMP  Records Review:   Previous Doctor Records, Previous Patient Records  Urine Test Review:   Urinalysis  X-Ray Review: PET Scan: Reviewed Films. Reviewed Report. Discussed With Patient. Left testicular and inguinal node uptake.  Scrotal Ultrasound: Reviewed Films. Reviewed Report. Discussed With Patient.    Notes:                     Records from Dr. Irene Limbo reviewed. CMP an CBC are ok.    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 I discussed the side effects of castration and briefly discussed testosterone replacement therapy.    PLAN:           Schedule Return Visit/Planned Activity: ASAP - Schedule Surgery  Procedure: Unspecified Date - Radical Orchiectomy - [id number], left Notes: ASAP

## 2020-03-05 ENCOUNTER — Other Ambulatory Visit: Payer: Self-pay

## 2020-03-05 ENCOUNTER — Ambulatory Visit (HOSPITAL_BASED_OUTPATIENT_CLINIC_OR_DEPARTMENT_OTHER): Payer: PPO | Admitting: Physician Assistant

## 2020-03-05 ENCOUNTER — Encounter (HOSPITAL_BASED_OUTPATIENT_CLINIC_OR_DEPARTMENT_OTHER): Payer: Self-pay | Admitting: Urology

## 2020-03-05 ENCOUNTER — Ambulatory Visit (HOSPITAL_BASED_OUTPATIENT_CLINIC_OR_DEPARTMENT_OTHER)
Admission: RE | Admit: 2020-03-05 | Discharge: 2020-03-05 | Disposition: A | Discharge status: Home / Self Care | Payer: PPO | Attending: Urology | Admitting: Urology

## 2020-03-05 ENCOUNTER — Encounter (HOSPITAL_BASED_OUTPATIENT_CLINIC_OR_DEPARTMENT_OTHER)
Admission: RE | Disposition: A | Discharge status: Home / Self Care | Payer: Self-pay | Source: Home / Self Care | Attending: Urology

## 2020-03-05 DIAGNOSIS — C8330 Diffuse large B-cell lymphoma, unspecified site: Secondary | ICD-10-CM | POA: Diagnosis not present

## 2020-03-05 DIAGNOSIS — E7800 Pure hypercholesterolemia, unspecified: Secondary | ICD-10-CM | POA: Diagnosis not present

## 2020-03-05 DIAGNOSIS — N5089 Other specified disorders of the male genital organs: Secondary | ICD-10-CM | POA: Diagnosis not present

## 2020-03-05 DIAGNOSIS — C8339 Diffuse large B-cell lymphoma, extranodal and solid organ sites: Secondary | ICD-10-CM | POA: Diagnosis not present

## 2020-03-05 DIAGNOSIS — N509 Disorder of male genital organs, unspecified: Secondary | ICD-10-CM | POA: Diagnosis not present

## 2020-03-05 DIAGNOSIS — I10 Essential (primary) hypertension: Secondary | ICD-10-CM | POA: Diagnosis not present

## 2020-03-05 DIAGNOSIS — R351 Nocturia: Secondary | ICD-10-CM | POA: Diagnosis not present

## 2020-03-05 DIAGNOSIS — Z85820 Personal history of malignant melanoma of skin: Secondary | ICD-10-CM | POA: Insufficient documentation

## 2020-03-05 DIAGNOSIS — D4012 Neoplasm of uncertain behavior of left testis: Secondary | ICD-10-CM | POA: Diagnosis not present

## 2020-03-05 DIAGNOSIS — E559 Vitamin D deficiency, unspecified: Secondary | ICD-10-CM | POA: Diagnosis not present

## 2020-03-05 DIAGNOSIS — Z96651 Presence of right artificial knee joint: Secondary | ICD-10-CM | POA: Insufficient documentation

## 2020-03-05 DIAGNOSIS — N401 Enlarged prostate with lower urinary tract symptoms: Secondary | ICD-10-CM | POA: Insufficient documentation

## 2020-03-05 HISTORY — DX: Personal history of malignant melanoma of skin: Z85.820

## 2020-03-05 HISTORY — DX: Left bundle-branch block, unspecified: I44.7

## 2020-03-05 HISTORY — DX: Male erectile dysfunction, unspecified: N52.9

## 2020-03-05 HISTORY — DX: Personal history of other specified conditions: Z87.898

## 2020-03-05 HISTORY — DX: Other specified disorders of the male genital organs: N50.89

## 2020-03-05 HISTORY — DX: Urgency of urination: R39.15

## 2020-03-05 HISTORY — DX: Nonexudative age-related macular degeneration, bilateral, stage unspecified: H35.3130

## 2020-03-05 HISTORY — DX: Presence of external hearing-aid: Z97.4

## 2020-03-05 HISTORY — DX: Monoclonal gammopathy: D47.2

## 2020-03-05 HISTORY — DX: Personal history of malignant neoplasm, unspecified: Z85.9

## 2020-03-05 HISTORY — PX: ORCHIECTOMY: SHX2116

## 2020-03-05 HISTORY — DX: Other specified postprocedural states: Z85.9

## 2020-03-05 HISTORY — DX: Other specified postprocedural states: Z98.890

## 2020-03-05 HISTORY — DX: Personal history of other malignant neoplasm of skin: Z85.828

## 2020-03-05 HISTORY — DX: Other specified postprocedural states: Z85.828

## 2020-03-05 HISTORY — DX: Personal history of urinary calculi: Z87.442

## 2020-03-05 SURGERY — ORCHIECTOMY
Anesthesia: General | Site: Scrotum | Laterality: Left

## 2020-03-05 MED ORDER — CEFAZOLIN SODIUM-DEXTROSE 2-4 GM/100ML-% IV SOLN
INTRAVENOUS | Status: AC
  Filled 2020-03-05: qty 100

## 2020-03-05 MED ORDER — ONDANSETRON HCL 4 MG/2ML IJ SOLN
INTRAMUSCULAR | Status: DC | PRN
  Administered 2020-03-05: 4 mg via INTRAVENOUS

## 2020-03-05 MED ORDER — EPHEDRINE SULFATE-NACL 50-0.9 MG/10ML-% IV SOSY
PREFILLED_SYRINGE | INTRAVENOUS | Status: DC | PRN
  Administered 2020-03-05: 10 mg via INTRAVENOUS

## 2020-03-05 MED ORDER — FENTANYL CITRATE (PF) 100 MCG/2ML IJ SOLN
INTRAMUSCULAR | Status: DC | PRN
  Administered 2020-03-05 (×3): 25 ug via INTRAVENOUS
  Administered 2020-03-05: 50 ug via INTRAVENOUS
  Administered 2020-03-05 (×2): 25 ug via INTRAVENOUS

## 2020-03-05 MED ORDER — LACTATED RINGERS IV SOLN: INTRAVENOUS | Status: DC

## 2020-03-05 MED ORDER — OXYCODONE HCL 5 MG PO TABS
ORAL_TABLET | ORAL | Status: AC
  Filled 2020-03-05: qty 1

## 2020-03-05 MED ORDER — PROPOFOL 10 MG/ML IV BOLUS
INTRAVENOUS | Status: DC | PRN
  Administered 2020-03-05: 120 mg via INTRAVENOUS

## 2020-03-05 MED ORDER — CEFAZOLIN SODIUM-DEXTROSE 2-4 GM/100ML-% IV SOLN
2.0000 g | INTRAVENOUS | Status: AC
  Administered 2020-03-05: 2 g via INTRAVENOUS

## 2020-03-05 MED ORDER — DEXAMETHASONE SODIUM PHOSPHATE 10 MG/ML IJ SOLN
INTRAMUSCULAR | Status: AC
  Filled 2020-03-05: qty 1

## 2020-03-05 MED ORDER — ONDANSETRON HCL 4 MG/2ML IJ SOLN
INTRAMUSCULAR | Status: AC
  Filled 2020-03-05: qty 2

## 2020-03-05 MED ORDER — OXYCODONE HCL 5 MG PO TABS: 5.0000 mg | ORAL_TABLET | ORAL | Status: DC | PRN

## 2020-03-05 MED ORDER — ACETAMINOPHEN 325 MG PO TABS: 650.0000 mg | ORAL_TABLET | ORAL | Status: DC | PRN

## 2020-03-05 MED ORDER — LIDOCAINE 2% (20 MG/ML) 5 ML SYRINGE
INTRAMUSCULAR | Status: DC | PRN
  Administered 2020-03-05: 60 mg via INTRAVENOUS

## 2020-03-05 MED ORDER — EPHEDRINE 5 MG/ML INJ
INTRAVENOUS | Status: AC
  Filled 2020-03-05: qty 10

## 2020-03-05 MED ORDER — ONDANSETRON HCL 4 MG/2ML IJ SOLN: 4.0000 mg | Freq: Once | INTRAMUSCULAR | Status: DC | PRN

## 2020-03-05 MED ORDER — MORPHINE SULFATE (PF) 4 MG/ML IV SOLN: 2.0000 mg | INTRAVENOUS | Status: DC | PRN

## 2020-03-05 MED ORDER — OXYCODONE HCL 5 MG/5ML PO SOLN: 5.0000 mg | Freq: Once | ORAL | Status: AC | PRN

## 2020-03-05 MED ORDER — BUPIVACAINE HCL (PF) 0.5 % IJ SOLN
INTRAMUSCULAR | Status: DC | PRN
  Administered 2020-03-05: 7 mL

## 2020-03-05 MED ORDER — OXYCODONE HCL 5 MG PO TABS
5.0000 mg | ORAL_TABLET | Freq: Once | ORAL | Status: AC | PRN
  Administered 2020-03-05: 5 mg via ORAL

## 2020-03-05 MED ORDER — ACETAMINOPHEN 325 MG RE SUPP: 650.0000 mg | RECTAL | Status: DC | PRN

## 2020-03-05 MED ORDER — SODIUM CHLORIDE 0.9 % IV SOLN: 250.0000 mL | INTRAVENOUS | Status: DC | PRN

## 2020-03-05 MED ORDER — FENTANYL CITRATE (PF) 100 MCG/2ML IJ SOLN
INTRAMUSCULAR | Status: AC
  Filled 2020-03-05: qty 2

## 2020-03-05 MED ORDER — HYDROCODONE-ACETAMINOPHEN 5-325 MG PO TABS: 1.0000 | ORAL_TABLET | Freq: Four times a day (QID) | ORAL | 0 refills | Status: DC | PRN

## 2020-03-05 MED ORDER — LIDOCAINE 2% (20 MG/ML) 5 ML SYRINGE
INTRAMUSCULAR | Status: AC
  Filled 2020-03-05: qty 5

## 2020-03-05 MED ORDER — SODIUM CHLORIDE 0.9% FLUSH: 3.0000 mL | INTRAVENOUS | Status: DC | PRN

## 2020-03-05 MED ORDER — DEXAMETHASONE SODIUM PHOSPHATE 10 MG/ML IJ SOLN
INTRAMUSCULAR | Status: DC | PRN
  Administered 2020-03-05: 5 mg via INTRAVENOUS

## 2020-03-05 MED ORDER — SODIUM CHLORIDE 0.9% FLUSH: 3.0000 mL | Freq: Two times a day (BID) | INTRAVENOUS | Status: DC

## 2020-03-05 MED ORDER — FENTANYL CITRATE (PF) 100 MCG/2ML IJ SOLN: 25.0000 ug | INTRAMUSCULAR | Status: DC | PRN

## 2020-03-05 SURGICAL SUPPLY — 59 items
ADH SKN CLS APL DERMABOND .7 (GAUZE/BANDAGES/DRESSINGS) ×1
APL SWBSTK 6 STRL LF DISP (MISCELLANEOUS)
APPLICATOR COTTON TIP 6 STRL (MISCELLANEOUS) IMPLANT
APPLICATOR COTTON TIP 6IN STRL (MISCELLANEOUS)
BLADE CLIPPER SENSICLIP SURGIC (BLADE) ×2 IMPLANT
BLADE HEX COATED 2.75 (ELECTRODE) ×1 IMPLANT
BLADE SURG 15 STRL LF DISP TIS (BLADE) ×1 IMPLANT
BLADE SURG 15 STRL SS (BLADE) ×2
BNDG GAUZE ELAST 4 BULKY (GAUZE/BANDAGES/DRESSINGS) ×2 IMPLANT
CANISTER SUCT 1200ML W/VALVE (MISCELLANEOUS) ×2 IMPLANT
CLEANER CAUTERY TIP 5X5 PAD (MISCELLANEOUS) ×1 IMPLANT
COVER BACK TABLE 60X90IN (DRAPES) ×2 IMPLANT
COVER MAYO STAND STRL (DRAPES) ×2 IMPLANT
COVER WAND RF STERILE (DRAPES) ×2 IMPLANT
DERMABOND ADVANCED (GAUZE/BANDAGES/DRESSINGS) ×1
DERMABOND ADVANCED .7 DNX12 (GAUZE/BANDAGES/DRESSINGS) ×1 IMPLANT
DISSECTOR ROUND CHERRY 3/8 STR (MISCELLANEOUS) IMPLANT
DRAIN PENROSE 0.25X18 (DRAIN) IMPLANT
DRAIN PENROSE LF 8X20.3CM SIL (WOUND CARE) ×1 IMPLANT
DRAPE LAPAROTOMY TRNSV 102X78 (DRAPES) ×2 IMPLANT
DRSG TEGADERM 4X4.75 (GAUZE/BANDAGES/DRESSINGS) IMPLANT
ELECT NDL TIP 2.8 STRL (NEEDLE) IMPLANT
ELECT NEEDLE TIP 2.8 STRL (NEEDLE) ×2 IMPLANT
ELECT REM PT RETURN 9FT ADLT (ELECTROSURGICAL) ×2
ELECTRODE REM PT RTRN 9FT ADLT (ELECTROSURGICAL) ×1 IMPLANT
GAUZE SPONGE 4X4 12PLY STRL (GAUZE/BANDAGES/DRESSINGS) IMPLANT
GAUZE SPONGE 4X4 12PLY STRL LF (GAUZE/BANDAGES/DRESSINGS) ×1 IMPLANT
GLOVE SURG SS PI 8.0 STRL IVOR (GLOVE) ×2 IMPLANT
GOWN STRL REUS W/TWL XL LVL3 (GOWN DISPOSABLE) ×2 IMPLANT
KIT TURNOVER CYSTO (KITS) ×2 IMPLANT
NEEDLE HYPO 22GX1.5 SAFETY (NEEDLE) ×2 IMPLANT
NS IRRIG 500ML POUR BTL (IV SOLUTION) IMPLANT
PAD CLEANER CAUTERY TIP 5X5 (MISCELLANEOUS) ×1
PENCIL BUTTON HOLSTER BLD 10FT (ELECTRODE) ×2 IMPLANT
SET BASIN DAY SURGERY F.S. (CUSTOM PROCEDURE TRAY) ×2 IMPLANT
SET COLLECT BLD 25X3/4 12 (NEEDLE) IMPLANT
SILICONE PENROSE DRAIN IMPLANT
SUPPORT SCROTAL LG STRP (MISCELLANEOUS) IMPLANT
SUPPORT SCROTAL MED ADLT STRP (MISCELLANEOUS) IMPLANT
SUT CHROMIC 3 0 SH 27 (SUTURE) ×1 IMPLANT
SUT CHROMIC GUT AB #0 18 (SUTURE) IMPLANT
SUT MNCRL AB 4-0 PS2 18 (SUTURE) ×1 IMPLANT
SUT PROLENE 2 0 CT2 30 (SUTURE) IMPLANT
SUT SILK 3 0 TIES 17X18 (SUTURE)
SUT SILK 3-0 18XBRD TIE BLK (SUTURE) IMPLANT
SUT VIC AB 0 CT2 27 (SUTURE) ×2 IMPLANT
SUT VIC AB 3-0 SH 27 (SUTURE) ×2
SUT VIC AB 3-0 SH 27X BRD (SUTURE) IMPLANT
SUT VICRYL 0 TIES 12 18 (SUTURE) IMPLANT
SUT VICRYL 2 0 18  UND BR (SUTURE)
SUT VICRYL 2 0 18 UND BR (SUTURE) IMPLANT
SYR 20ML LL LF (SYRINGE) IMPLANT
SYR BULB IRRIG 60ML STRL (SYRINGE) ×2 IMPLANT
SYR CONTROL 10ML LL (SYRINGE) ×2 IMPLANT
TOWEL OR 17X26 10 PK STRL BLUE (TOWEL DISPOSABLE) ×2 IMPLANT
TRAY DSU PREP LF (CUSTOM PROCEDURE TRAY) ×2 IMPLANT
TUBE CONNECTING 12X1/4 (SUCTIONS) ×2 IMPLANT
WATER STERILE IRR 500ML POUR (IV SOLUTION) IMPLANT
YANKAUER SUCT BULB TIP NO VENT (SUCTIONS) ×2 IMPLANT

## 2020-03-05 NOTE — Anesthesia Postprocedure Evaluation (Signed)
Anesthesia Post Note  Patient: Antonio [name]  Procedure(s) Performed: ORCHIECTOMY (Left Scrotum)     Patient location during evaluation: PACU Anesthesia Type: General Level of consciousness: awake and alert and oriented Pain management: pain level controlled Vital Signs Assessment: post-procedure vital signs reviewed and stable Respiratory status: spontaneous breathing, nonlabored ventilation and respiratory function stable Cardiovascular status: blood pressure returned to baseline and stable Postop Assessment: no apparent nausea or vomiting Anesthetic complications: no    Last Vitals:  Vitals:   03/05/20 1145 03/05/20 1200  BP: 115/83 (!) 158/83  Pulse: 89 82  Resp: 16 17  Temp:    SpO2: 95% 95%    Last Pain:  Vitals:   03/05/20 1200  TempSrc:   PainSc: 3                  Jary Louvier A.

## 2020-03-05 NOTE — Anesthesia Procedure Notes (Signed)
Procedure Name: LMA Insertion Date/Time: 03/05/2020 10:31 AM Performed by: Suan Halter, CRNA Pre-anesthesia Checklist: Patient identified, Emergency Drugs available, Suction available and Patient being monitored Patient Re-evaluated:Patient Re-evaluated prior to induction Oxygen Delivery Method: Circle system utilized Preoxygenation: Pre-oxygenation with 100% oxygen Induction Type: IV induction Ventilation: Mask ventilation without difficulty LMA: LMA inserted LMA Size: 4.0 Number of attempts: 1 Airway Equipment and Method: Bite block Placement Confirmation: positive ETCO2 Tube secured with: Tape Dental Injury: Teeth and Oropharynx as per pre-operative assessment

## 2020-03-05 NOTE — Op Note (Signed)
Procedure: Left radical orchiectomy.  Preop diagnosis: Left testicular mass with probable lymphoma.  Postop diagnosis: Same.  Surgeon: Dr. [name].  Anesthesia: General.  Specimen: Left testicle and cord.  Drain: None.  EBL: 5 mL.  Complications: None.  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  He presented recently with a rapidly enlarging left testicular mass that was metabolically active on PET scanning and is felt to be consistent with a testicular lymphoma.  He was also noted to have a probable node in the cord in the lower inguinal canal.  It was felt that radical orchiectomy was indicated for initial treatment and diagnosis.  Procedure: He was given 2 g of Ancef.  He was taken operating room where general anesthetic was induced with him in the supine position he was fitted with PAS hose.  His left inguinal area and scrotum was clipped and he was prepped with Betadine solution.  He was then draped in the usual sterile fashion.  A left low inguinal incision was made along the skin lines approximately 4 cm in length over the cord.  The incision was carried through the subcutaneous tissue with the Bovie and blunt dissection exposing the external oblique fascia.  The subcutaneous tissue adjacent to the incision was infiltrated with approximately 4 mL of quarter percent Marcaine.  The external oblique fascia was incised with a knife and scissors along the fibers over the cord with care being taken to avoid the ilioinguinal nerve.  The cord was then dissected off of the floor of the inguinal canal and doubly encircled with a Penrose drain.  The testicle within the tunica vaginalis was then delivered into the wound and the gubernacular attachments were taken down with the Bovie.  Once the testicle was released the cord was divided into 2 packets proximally, clamped with hemostats, divided and ligated with 2-0 Vicryl  suture ligature ligatures and free ties.  The specimen was sent for pathologic analysis.  The wound was then inspected for hemostasis and no significant bleeding was identified.  The external oblique fascia was then closed using a running 3-0 Vicryl with great care being taken to avoid the ilioinguinal nerve.  The subcutaneous tissue was then reapproximated using interrupted 3-0 chromic sutures and the skin was closed with 4-0 Monocryl and then reinforced with Dermabond.  A fluff Kerlix and scrotal support were then applied.  His anesthetic was reversed and he was moved recovery in stable condition.  There were no complications.  CC:Dr. Sullivan Lone

## 2020-03-05 NOTE — Interval H&P Note (Signed)
History and Physical Interval Note:  03/05/2020 10:10 AM  Antonio [name]  has presented today for surgery, with the diagnosis of LEFT TESTICULAR MASS.  The various methods of treatment have been discussed with the patient and family. After consideration of risks, benefits and other options for treatment, the patient has consented to  Procedure(s): ORCHIECTOMY (Left) as a surgical intervention.  The patient's history has been reviewed, patient examined, no change in status, stable for surgery.  I have reviewed the patient's chart and labs.  Questions were answered to the patient's satisfaction.     [name]

## 2020-03-05 NOTE — Transfer of Care (Signed)
Immediate Anesthesia Transfer of Care Note  Patient: Antonio [name]  Procedure(s) Performed: Procedure(s) (LRB): ORCHIECTOMY (Left)  Patient Location: PACU  Anesthesia Type: General  Level of Consciousness: awake, oriented, sedated and patient cooperative  Airway & Oxygen Therapy: Patient Spontanous Breathing and Patient connected to face mask oxygen  Post-op Assessment: Report given to PACU RN and Post -op Vital signs reviewed and stable  Post vital signs: Reviewed and stable  Complications: No apparent anesthesia complications Last Vitals:  Vitals Value Taken Time  BP 136/86 03/05/20 1125  Temp    Pulse 92 03/05/20 1128  Resp 17 03/05/20 1128  SpO2 98 % 03/05/20 1128  Vitals shown include unvalidated device data.  Last Pain:  Vitals:   03/05/20 0813  TempSrc: Oral  PainSc: 0-No pain

## 2020-03-05 NOTE — Discharge Instructions (Addendum)
Orchiectomy, Care After This sheet gives you information about how to care for yourself after your procedure. Your health care provider may also give you more specific instructions. If you have problems or questions, contact your health care provider. What can I expect after the procedure? After the procedure, it is common to have:  Pain.  Bruising.  Blood pooling (hematoma) in the area where your testicles were removed.  Depression or mood changes.  Fatigue.  Hot flashes. Follow these instructions at home: Managing pain and swelling  If directed, put ice on the affected area: ? Put ice in a plastic bag. ? Place a towel between your skin and the bag. ? Leave the ice on for 20 minutes, 2-3 times a day.  Wear scrotal support as told by your health care provider.  To relieve pressure and pain when sitting, you may use a donut cushion if directed by your health care provider. Incision care   Follow instructions from your health care provider about how to take care of your incision. Make sure you: ? Wash your hands with soap and water before you change your bandage (dressing). If soap and water are not available, use hand sanitizer. ? Change your dressing once a day, or as often as told by your health care provider. If the dressing sticks to your incision area:  Use warm, soapy water or hydrogen peroxide to dampen the dressing.  When the dressing becomes loose, lift it from the incision area. Make sure that the incision stays closed. ? Leave stitches (sutures), skin glue, or adhesive strips in place. These skin closures may need to stay in place for 2 weeks or longer. If adhesive strip edges start to loosen and curl up, you may trim the loose edges. Do not remove adhesive strips completely unless your health care provider tells you to do that.  Keep your dressing dry until it has been removed.  Check your incision area every day for signs of infection. Check for: ? More redness,  swelling, or pain. ? More fluid or blood. ? Warmth. ? Pus or a bad smell. Bathing  Do not take baths, swim, or use a hot tub until your health care provider approves. You may start taking showers two days after your procedure.  Do not rub your incision to dry it. Pat the area gently with a clean cloth or let it air-dry. Medicines  Take over-the-counter and prescription medicines only as told by your health care provider.  If you were prescribed an antibiotic medicine, use it as told by your health care provider. Do not stop using the antibiotic even if you start to feel better.  If you had both testicles removed, talk with your health care provider about medicine supplements to replace one of the male hormones (testosterone) that your body will no longer make. Driving  Do not drive for 24 hours if you were given a medicine to help you relax (sedative).  Do not drive or use heavy machinery while taking prescription pain medicines. Activity  Avoid activities that may cause your incision to open, such as jogging, playing sports, and straining with bowel movements. Ask your health care provider what activities are safe for you.  Do not lift anything that is heavier than 10 lb (4.5 kg), or the limit that your health care provider tells you, until he or she says that it is safe.  Do not engage in sexual activity until the area is healed and your health care provider approves.   This could take up to 4 weeks. General instructions  To prevent or treat constipation while you are taking prescription pain medicine, your health care provider may recommend that you: ? Drink enough fluid to keep your urine clear or pale yellow. ? Take over-the-counter or prescription medicines. ? Eat foods that are high in fiber, such as fresh fruits and vegetables, whole grains, and beans. ? Limit foods that are high in fat and processed sugars, such as fried and sweet foods.  Do not use any products that  contain nicotine or tobacco, such as cigarettes and e-cigarettes. If you need help quitting, ask your health care provider.  Keep all follow-up visits as told by your health care provider. This is important. Contact a health care provider if:  You have more pain, swelling, or redness in your genital or groin area.  You have more fluid or blood coming from your incision.  Your incision feels warm to the touch.  You have pus or a bad smell coming from your incision.  You have constipation that is not helped by changing your diet or drinking more fluid.  You develop nausea or vomiting.  You cannot eat or drink without vomiting. Get help right away if:  You have dizziness or nausea that does not go away.  You have trouble breathing.  You have a wet (congested) cough.  You have a fever or shaking chills.  Your incision breaks open after the skin closures have been removed.  You are not able to urinate. Summary  After this procedure, it is most common to have bruising or blood pooling in the area where the testicles were removed.  You should check your incision area every day for signs of infection, such as redness, swelling, pain, fluid, blood, warmth, pus, or a bad smell.  Avoid activities that may cause your incision to open, such as jogging, playing sports, and straining with bowel movements. Ask your health care provider what activities are safe for you.  You should not engage in sexual activity until the area is healed and your health care provider approves. This could take up to 4 weeks.  Men who have both testicles removed may have emotional and physical side effects. Your health care provider can help you with ways to manage those side effects.   This information is not intended to replace advice given to you by your health care provider. Make sure you discuss any questions you have with your health care provider. Document Revised: 09/15/2017 Document Reviewed:  08/25/2016   Elsevier Patient Education  [place] Instructions  Activity: Get plenty of rest for the remainder of the day. A responsible adult should stay with you for 24 hours following the procedure.  For the next 24 hours, DO NOT: -Drive a car -Paediatric nurse -Drink alcoholic beverages -Take any medication unless instructed by your physician -Make any legal decisions or sign important papers.  Meals: Start with liquid foods such as gelatin or soup. Progress to regular foods as tolerated. Avoid greasy, spicy, heavy foods. If nausea and/or vomiting occur, drink only clear liquids until the nausea and/or vomiting subsides. Call your physician if vomiting continues.  Special Instructions/Symptoms: Your throat may feel dry or sore from the anesthesia or the breathing tube placed in your throat during surgery. If this causes discomfort, gargle with warm salt water. The discomfort should disappear within 24 hours.  If you had a scopolamine patch placed behind your ear for  the management of post- operative nausea and/or vomiting:  1. The medication in the patch is effective for 72 hours, after which it should be removed.  Wrap patch in a tissue and discard in the trash. Wash hands thoroughly with soap and water. 2. You may remove the patch earlier than 72 hours if you experience unpleasant side effects which may include dry mouth, dizziness or visual disturbances. 3. Avoid touching the patch. Wash your hands with soap and water after contact with the patch.

## 2020-03-05 NOTE — Anesthesia Preprocedure Evaluation (Signed)
Anesthesia Evaluation  Patient identified by MRN, date of birth, ID band Patient awake    Reviewed: Allergy & Precautions, NPO status , Patient's Chart, lab work & pertinent test results  Airway Mallampati: II  TM Distance: >3 FB Neck ROM: Full    Dental  (+) Caps, Teeth Intact   Pulmonary neg pulmonary ROS,    Pulmonary exam normal breath sounds clear to auscultation       Cardiovascular hypertension, Pt. 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   Anesthesia Other Findings   Reproductive/Obstetrics Left testicular mass Hx/o right testicular B cell lymphoma                             Anesthesia Physical Anesthesia Plan  ASA: III  Anesthesia Plan: General   Post-op Pain Management:    Induction: Intravenous  PONV Risk Score and Plan: 4 or greater and Ondansetron and Treatment may vary due to age or medical condition  Airway Management Planned: LMA  Additional Equipment:   Intra-op Plan:   Post-operative Plan: Extubation in OR  Informed Consent: I have reviewed the patients History and Physical, chart, labs and discussed the procedure including the risks, benefits and alternatives for the proposed anesthesia with the patient or authorized representative who has indicated his/her understanding and acceptance.     Dental advisory given  Plan Discussed with: CRNA and Surgeon  Anesthesia Plan Comments:          Anesthesia Quick Evaluation

## 2020-03-06 ENCOUNTER — Encounter: Payer: Self-pay | Admitting: Internal Medicine

## 2020-03-06 ENCOUNTER — Telehealth: Payer: Self-pay

## 2020-03-06 LAB — TESTOSTERONE: Testosterone: 12 ng/dL — ABNORMAL LOW (ref 264–916)

## 2020-03-06 NOTE — Telephone Encounter (Signed)
Per Dr. Mariea Clonts Cholesterol and sugar are in normal range. Called patient and gave him results.

## 2020-03-11 ENCOUNTER — Other Ambulatory Visit: Payer: Self-pay

## 2020-03-11 ENCOUNTER — Encounter: Payer: Self-pay | Admitting: Internal Medicine

## 2020-03-11 ENCOUNTER — Non-Acute Institutional Stay: Payer: PPO | Admitting: Internal Medicine

## 2020-03-11 VITALS — BP 118/78 | HR 75 | Temp 97.2°F | Ht 72.0 in | Wt 191.8 lb

## 2020-03-11 DIAGNOSIS — Z9079 Acquired absence of other genital organ(s): Secondary | ICD-10-CM | POA: Diagnosis not present

## 2020-03-11 DIAGNOSIS — Z7189 Other specified counseling: Secondary | ICD-10-CM | POA: Diagnosis not present

## 2020-03-11 DIAGNOSIS — Z8572 Personal history of non-Hodgkin lymphomas: Secondary | ICD-10-CM | POA: Diagnosis not present

## 2020-03-11 DIAGNOSIS — D472 Monoclonal gammopathy: Secondary | ICD-10-CM

## 2020-03-11 DIAGNOSIS — N5089 Other specified disorders of the male genital organs: Secondary | ICD-10-CM

## 2020-03-11 DIAGNOSIS — H6123 Impacted cerumen, bilateral: Secondary | ICD-10-CM

## 2020-03-11 DIAGNOSIS — K5901 Slow transit constipation: Secondary | ICD-10-CM | POA: Diagnosis not present

## 2020-03-11 DIAGNOSIS — Z0000 Encounter for general adult medical examination without abnormal findings: Secondary | ICD-10-CM

## 2020-03-11 LAB — SURGICAL PATHOLOGY

## 2020-03-11 NOTE — Addendum Note (Signed)
Addended by: Tanna Savoy on: 03/11/2020 03:13 PM   Modules accepted: Orders

## 2020-03-11 NOTE — Progress Notes (Signed)
HEMATOLOGY/ONCOLOGY CLINIC NOTE  Date of Service: 03/12/2020  Patient Care Team: Antonio Curry, DO as PCP - General (Geriatric Medicine)  CHIEF COMPLAINTS/PURPOSE OF CONSULTATION:  New testicular mass IgM MGUS  HISTORY OF PRESENTING ILLNESS:   Antonio [name] is a wonderful 79 y.o. male who has been referred to [place] by Dr. Hollace [name] for evaluation and management of Bone lesion of left lower leg. The pt reports that he is doing well [name].   The pt reports that he cannot feel the ovoid finding from his 04/09/18 MRI as noted below. He denies being able to feel anything in his skin as well. He notes that he received a needle injection in his knee prior to this MRI. The pt notes that his knee has lost much cartilage and is considering a knee replacement. He denies any recent injuries to his knee or falls. He has contacted Dr. Alphonsa [name] in surgery and another surgeon as well and will be making an appointment.   The pt notes that he does not feel any differently recently as compared to 6 months to a year ago. He does note that he normally has constipation in the spring, and has continued to have mild constipation. He denies any other concerns or symptoms.   The pt previously saw my colleague Dr. Lurline [name] for primary testicular B-cell lymphoma in 2000. The pt notes that the involvement was limited to only one testicle. He received 6 intrathecal treatments in his spine, and received 4 cycles of chemotherapy (thinks this was CHOP) and radiation as well. The pt was followed by an oncologist for several years at the [facility] in [place] after moving from [place] after his [place] treatment.   The pt notes that he was first diagnosed with melanoma in October 2000. He noticed a strange spot on his left leg that was surgically resected. The pt's personal notes report a 2.9cm thick, level 4, no LN involvement, high risk stage II, treated with Interferon three  times a week for one year. He notes some squamous cell and basal cell involvements as well and sees a dermatologist, Antonio [name], regularly.   Of note prior to the patient's visit today, pt has had MRI Left Knee completed on 04/09/18 with results revealing An ovoid, lobulated, 40m T2 hyperintense focus within the medial subcutaneous far 1.930mdistal to the joint demonstrates demonstrates nonspecific imaging characteristics.   Most recent lab results (03/01/18) of CBC w/diff is as follows: all values are WNL except for RBC at 4.54, MCV at 100.4, MCH at 33.8.   On review of systems, pt reports left knee pain, mild constipation, and denies new or concerning symptoms, noticing any new lumps or bumps, pain along the spine, abdominal pains, problems passing urine, testicular pain/swelling, and any other symptoms.   Interval History:   WiISREAL MOLINEeturns today for management and evaluation evaluation of new testicular mass. Pt is joined by Antonio [name]. The patient's last visit with [place] on 02/26/20. The pt reports that he is doing well [name].  The pt reports he is good. Pt had a Orchiectomy and is healing well from surgery. He was previously in a trial for R-CHOP of 6 cycles. Pt is having trouble sleeping. He thinks he has a little bit of fatigue and a few hot flashes but attributes it sitting in the sun. Pt has an appointment with Dr. WrRoni [name] testosterone replacement.   Of note since the  patient's last visit, pt has had PET Initial Skull Base to Thigh ([id number]) completed on 03/02/20 with results revealing "1. Markedly hypermetabolic left testicular mass with probable adjacent involvement of the epididymis and spermatic cord as detailed above. 2. No hypermetabolic lymphadenopathy in the neck, chest, abdomen or pelvis." Pt has had Flow Pathology Report ([id number]) completed on 03/05/20 with results revealing "Monoclonal B-cell population identified"  Lab results today (02/26/20) of CBC  w/diff and CMP is as follows: all values are WNL except for MCV at 101.9, Glucose at 107 02/26/20 of LDH at 161: WNL 02/26/20 of MMP is as follows: all values are WNL except for IgM Srm at 798, M Protein SerPl Elph-Mcnc at 0.6  On review of systems, pt reports healthy appetite, trouble sleeping, fatigue and denies mouth soars, abdominal pain, hot flashes and any other symptoms.   MEDICAL HISTORY:  Past Medical History:  Diagnosis Date  . Age-related macular degeneration, dry, both eyes   . 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 Carotid stenosis, asymptomatic, left ?  . ED (erectile dysfunction)   . Elevated diaphragm    right  . Erectile dysfunction   . History of basal cell carcinoma (BCC) excision    multiple excision in office  . History of kidney stones   . History of seizure    03-03-2020 per pt x1 while taking interferan for melanoma in 2001 ,  none since, told a side effect of interferan  . History of squamous cell carcinoma excision    multiple skin excision in office  . Hyperlipidemia   . Hypertension    followed by pcp  (03-03-2020 per pt had a stress test approx. 2010, told normal)  . LBBB (left bundle branch block)   . Lymphoma of testis ([place]) 2000   s/p right orchiectomy   . Mass of left testis   . MGUS (monoclonal gammopathy of unknown significance)    oncologist--- dr Antonio [name]  . Personal history of malignant melanoma of skin dermatologist--- dr Antonio [name]   dx 2001 left calf area  s/p WLE ,  completed one year interferan (03-03-2020 pt states did not involve lymph nodes, no recurrence)  . Urgency of urination   . Wears hearing aid in both ears     SURGICAL HISTORY: Past Surgical History:  Procedure Laterality Date  . CATARACT EXTRACTION W/ INTRAOCULAR LENS IMPLANT  Bilateral 2018  . MELANOMA EXCISION  10/1999  . ORCHIECTOMY Right 2000  . ORCHIECTOMY Left 03/05/2020   Procedure: ORCHIECTOMY;  Surgeon: Antonio Seal, MD;  Location: [facility];  Service: Urology;  Laterality: Left;  . TONSILLECTOMY  child  . TOTAL KNEE ARTHROPLASTY Right 06/2009    SOCIAL HISTORY: Social History   Socioeconomic History  . Marital status: Married    Spouse name: Not on file  . Number of children: Not on file  . Years of education: Not on file  . Highest education level: Not on file  Occupational History  . Not on file  Tobacco Use  . Smoking status: Never Smoker  . Smokeless tobacco: Never Used  Substance and Sexual Activity  . Alcohol use: Yes    Alcohol/week: 14.0 standard drinks    Types: 14 Glasses of wine per week    Comment: 2 wine daily  . Drug use: Never  .  Sexual activity: Not on file  Other Topics Concern  . Not on file  Social History Narrative   Social History      Diet?good      Do you drink/eat things with caffeine? seldom      Marital status?          yes                          What year were you married? 1962      Do you live in a house, apartment, assisted living, condo, trailer, etc.? house      Is it one or more stories?1      How many persons live in your home? 2      Do you have any pets in your home? (please list) no      Highest level of education completed? college      Current or past profession: president and [employer]      Do you exercise?   yes                                   Type & how often? [employer] work, Teacher, adult education care      Do you have a living will? yes      Do you have a DNR form?       yes                            If not, do you want to discuss one?      Do you have signed POA/HPOA for forms?  yes      Functional Status      Do you have difficulty bathing or dressing yourself? no      Do you have difficulty preparing food or eating? no      Do you have  difficulty managing your medications? no      Do you have difficulty managing your finances? no      Do you have difficulty affording your medications? no   Social Determinants of Health   Financial Resource Strain:   . Difficulty of Paying Living Expenses:   Food Insecurity:   . Worried About Charity fundraiser in the Last Year:   . Arboriculturist in the Last Year:   Transportation Needs:   . Film/video editor (Medical):   Marland Kitchen Lack of Transportation (Non-Medical):   Physical Activity:   . Days of Exercise per Week:   . Minutes of Exercise per Session:   Stress:   . Feeling of Stress :   Social Connections:   . Frequency of Communication with Friends and Family:   . Frequency of Social Gatherings with Friends and Family:   . Attends Religious Services:   . Active Member of Clubs or Organizations:   . Attends Archivist Meetings:   Marland Kitchen Marital Status:   Intimate Partner Violence:   . Fear of Current or Ex-Partner:   . Emotionally Abused:   Marland Kitchen Physically Abused:   . Sexually Abused:     FAMILY HISTORY: Family History  Problem Relation Age of Onset  . Cataracts Mother   . Transient ischemic attack Mother   . Asthma Mother   . Cataracts Father   . Diabetes  Father        borderline  . Hypertension Father   . Hyperlipidemia Father   . Hyperlipidemia Son   . Hypertension Son   . Amblyopia Neg Hx   . Blindness Neg Hx   . Glaucoma Neg Hx   . Macular degeneration Neg Hx   . Retinal detachment Neg Hx   . Strabismus Neg Hx   . Retinitis pigmentosa Neg Hx     ALLERGIES:  is allergic to latex.  MEDICATIONS:  Current Outpatient Medications  Medication Sig Dispense Refill  . calcium carbonate (TUMS - DOSED IN MG ELEMENTAL CALCIUM) 500 MG chewable tablet Chew 1 tablet by mouth as needed for indigestion or heartburn.    . Cholecalciferol (VITAMIN D3) 2000 units TABS Take 1 tablet by mouth daily.    . Cyanocobalamin (B-12 PO) Take 1 tablet by mouth daily.     Marland Kitchen Ketotifen Fumarate (REFRESH EYE ITCH RELIEF OP) Apply to eye as needed.    Marland Kitchen losartan (COZAAR) 100 MG tablet Take 1 tablet (100 mg total) by mouth daily. 90 tablet 1  . meloxicam (MOBIC) 7.5 MG tablet TAKE 1 TABLET (7.5 MG TOTAL) BY MOUTH 2 (TWO) TIMES DAILY AS NEEDED FOR PAIN. 180 tablet 0  . Multiple Vitamins-Minerals (PRESERVISION AREDS 2 PO) Take 1 capsule by mouth 2 (two) times daily.     . simvastatin (ZOCOR) 40 MG tablet TAKE 1 TABLET BY MOUTH EVERY DAY 90 tablet 1  . valACYclovir (VALTREX) 1000 MG tablet Take one tablet by mouth twice daily for 3 days as needed for flares. 30 tablet 1   No current facility-administered medications for this visit.    REVIEW OF SYSTEMS:   A 10+ POINT REVIEW OF SYSTEMS WAS OBTAINED including neurology, dermatology, psychiatry, cardiac, respiratory, lymph, extremities, GI, GU, Musculoskeletal, constitutional, breasts, reproductive, HEENT.  All pertinent positives are noted in the HPI.  All others are negative.   PHYSICAL EXAMINATION: ECOG PERFORMANCE STATUS: 0 - Asymptomatic VS reviewed - stable  Vitals:   03/12/20 0945  BP: (!) 143/86  Pulse: 72  Resp: 18  Temp: 97.7 F (36.5 C)  SpO2: 97%   Wt Readings from Last 3 Encounters:  03/12/20 192 lb 14.4 oz (87.5 kg)  03/11/20 191 lb 12.8 oz (87 kg)  03/05/20 190 lb 4.8 oz (86.3 kg)   Body mass index is 26.16 kg/m.    GENERAL:alert, in no acute distress and comfortable SKIN: no acute rashes, no significant lesions EYES: conjunctiva are pink and non-injected, sclera anicteric OROPHARYNX: MMM, no exudates, no oropharyngeal erythema or ulceration NECK: supple, no JVD LYMPH:  no palpable lymphadenopathy in the cervical, axillary or inguinal regions LUNGS: clear to auscultation b/l with normal respiratory effort HEART: regular rate & rhythm ABDOMEN:  normoactive bowel sounds , non tender, not distended. Extremity: no pedal edema PSYCH: alert & oriented x 3 with fluent speech NEURO: no focal  motor/sensory deficits  LABORATORY DATA:  I have reviewed the data as listed  . CBC Latest Ref Rng & Units 02/26/2020 12/19/2019 12/19/2019  WBC 4.0 - 10.5 K/uL 5.4 6.9 -  Hemoglobin 13.0 - 17.0 g/dL 14.2 13.9 -  Hematocrit 39.0 - 52.0 % 43.7 42.8 40.8  Platelets 150 - 400 K/uL 207 204 -   . CBC    Component Value Date/Time   WBC 5.4 02/26/2020 0901   RBC 4.29 02/26/2020 0901   HGB 14.2 02/26/2020 0901   HCT 43.7 02/26/2020 0901   HCT 40.8 12/19/2019 1057  PLT 207 02/26/2020 0901   MCV 101.9 (H) 02/26/2020 0901   MCH 33.1 02/26/2020 0901   MCHC 32.5 02/26/2020 0901   RDW 12.7 02/26/2020 0901   LYMPHSABS 1.1 02/26/2020 0901   MONOABS 0.5 02/26/2020 0901   EOSABS 0.1 02/26/2020 0901   BASOSABS 0.0 02/26/2020 0901    . CMP Latest Ref Rng & Units 02/26/2020 12/19/2019 06/04/2019  Glucose 70 - 99 mg/dL 107(H) 79 85  BUN 8 - 23 mg/dL 15 18 24(H)  Creatinine 0.61 - 1.24 mg/dL 0.82 0.80 0.80  Sodium 135 - 145 mmol/L 143 143 142  Potassium 3.5 - 5.1 mmol/L 4.7 4.4 4.7  Chloride 98 - 111 mmol/L 105 106 108  CO2 22 - 32 mmol/L _0 Calcium 8.9 - 10.3 mg/dL 9.5 8.9 9.1  Total Protein 6.5 - 8.1 g/dL 7.4 7.1 6.6  Total Bilirubin 0.3 - 1.2 mg/dL 0.7 0.6 0.6  Alkaline Phos 38 - 126 U/L 65 66 63  AST 15 - 41 U/L _1 ALT 0 - 44 U/L _2 Component     Latest Ref Rng & Units 12/19/2019  Folate, Hemolysate     Not Estab. ng/mL 390.0  HCT     37.5 - 51.0 % 40.8  Folate, RBC     >498 ng/mL 956  Vitamin B12     180 - 914 pg/mL 882    03/01/18 CBC w/diff:    RADIOGRAPHIC STUDIES: I have personally reviewed the radiological images as listed and agreed with the findings in the report. NM PET Image Initial (PI) Skull Base To Thigh  Result Date: 03/02/2020 CLINICAL DATA:  Initial treatment strategy for left testicular mass. History of primary large B-cell lymphoma of the right testicle. Patient now has a left testicular mass. EXAM: NUCLEAR MEDICINE PET SKULL BASE TO  THIGH TECHNIQUE: 9.55 mCi F-18 FDG was injected intravenously. Full-ring PET imaging was performed from the skull base to thigh after the radiotracer. CT data was obtained and used for attenuation correction and anatomic localization. Fasting blood glucose: 94 mg/dl COMPARISON:  Testicular ultrasound 02/26/2020. FINDINGS: Mediastinal blood pool activity: SUV max 2.21 Liver activity: SUV max 2.74 NECK: No hypermetabolic lymph nodes in the neck. Incidental CT findings: none CHEST: No supraclavicular or axillary lymphadenopathy. No enlarged or hypermetabolic mediastinal or hilar lymph nodes. No worrisome pulmonary lesions. Incidental CT findings: Marked eventration of the right hemidiaphragm with overlying vascular crowding and atelectasis. Scattered aortic and coronary artery calcifications. ABDOMEN/PELVIS: Large markedly hypermetabolic left testicular mass measuring 5.2 x 4.3 cm. SUV max is 35.55. Suspect involvement of the epididymal head. There is also evidence of involvement of the spermatic cord with a separate mass or possible lymph node in the inguinal canal measuring 2.4 cm and SUV max is 29.96. No enlarged or hypermetabolic inguinal lymph nodes. No abdominal/pelvic lymphadenopathy. The liver and spleen are unremarkable. No adrenal gland lesions. Incidental CT findings: Scattered aortic and branch vessel calcifications but no aneurysm. SKELETON: No focal hypermetabolic activity to suggest skeletal metastasis. Incidental CT findings: none IMPRESSION: 1. Markedly hypermetabolic left testicular mass with probable adjacent involvement of the epididymis and spermatic cord as detailed above. 2. No hypermetabolic lymphadenopathy in the neck, chest, abdomen or pelvis. Electronically Signed   By: Marijo Sanes M.D.   On: 03/02/2020 12:26   US SCROTUM W/DOPPLER  Result Date: 02/26/2020 CLINICAL DATA:  History of lymphoma.  Left testicular enlargement EXAM: SCROTAL ULTRASOUND DOPPLER ULTRASOUND OF THE TESTICLES  TECHNIQUE: Complete ultrasound examination of the testicles, epididymis, and other scrotal structures was performed. Color and spectral Doppler ultrasound were also utilized to evaluate blood flow to the testicles. COMPARISON:  None. FINDINGS: Right testicle Surgically absent. Left testicle Measurements: 5.2 x 3.2 x 3.8 cm, volume 33 mL. Heterogeneously hypoechoic ovoid intratesticular mass with internal vascularity measuring 2.5 x 2.0 x 2.5 cm. Right epididymis:  Surgically absent. Left epididymis:  Normal in size and appearance. Hydrocele:  None visualized. Varicocele:  None visualized. Pulsed Doppler interrogation of the left testis demonstrates normal low resistance arterial and venous waveforms. IMPRESSION: 1. Solid 2.5 cm left intratesticular mass with internal vascularity. Differential includes testicular lymphoma, testicular carcinoma, or less likely hematoma if there is history of recent trauma. Urology consultation is recommended. 2. Prior right orchiectomy. These results will be called to the ordering clinician or representative by the Radiologist Assistant, and communication documented in the PACS or [employer]. Electronically Signed   By: Davina Poke D.O.   On: 02/26/2020 08:15    03/05/20 of Surgical Pathology ([id number]) SURGICAL PATHOLOGY  CASE: [id number]  PATIENT: [name]  Surgical Pathology Report      Clinical History: Left testicular mass (jmc)      FINAL MICROSCOPIC DIAGNOSIS:   A. TESTICLE AND CORD, LEFT, ORCHIECTOMY:  - Diffuse large B-cell lymphoma  -See comment    COMMENT:   The sections of the testis and spermatic cord nodules show effacement of  the architecture by an atypical lymphoid infiltrate characterized by  predominance of medium and large lymphoid cells with vesicular chromatin  and small nucleoli associated with brisk mitosis. The appearance is  primarily diffuse with lack of atypical follicles. Variable number of  admixed  smaller lymphocytes are seen. To further evaluate this process,  flow cytometric analysis was performed ([id number]) and shows a  monoclonal kappa-restricted B-cell population. In addition, a battery  of immunohistochemical stains was performed and shows that the atypical  lymphoid cells are positive for CD20, CD79a, PAX 5, CD10 (weak), BCL-2,  MUM-1 and cytoplasmic kappa. No significant staining is seen with  CD138, cyclin D1, CD30, CD34, TdT, EBV in situ hybridization or  cytoplasmic lambda. Ki67 shows variable expression ranging from 10% to  >50%. There is an admixed T-cell population to a lesser extent as seen  with CD3 and CD5 and there is no apparent co-expression of CD5 in B-cell  areas. The [name] features are consistent with involvement by diffuse  large B-cell lymphoma, GCB type. The results were discussed with Dr.  Jeffie Pollock and Antonio [name] on 03/11/2020.    ASSESSMENT & PLAN:   79 y.o. male with  1. Ovoid lesion near the medial aspect of the left knee ? Injection granuloma vs other etiology for lesion. however given previous h/o melanoma and lymphoma will need to be cautious and try to wotrk this up.  04/09/18 MRI of Left Knee revealed An ovoid, lobulated, 78m T2 hyperintense focus within the medial subcutaneous far 1.975mdistal to the joint demonstrates demonstrates nonspecific imaging characteristics.   05/17/18 [place] Soft Tissue Biopsy of the Left Knee did not identify any malignant cells and revealed inflammatory cells.  2. MGUS- IgM kappa monoclonal paraproteinemia 05/02/18 MMP revealed M Protein at 0.5 with immunofixation revealing IgM with kappa light chains  3/ h/o Melanoma   4. H/o Rt Primary testicular large B cell lymphoma in 2000 treatment with R-CHOP x 6 cycles as part of clinical trial + IT MTX x 4 cycles and RT to left  testicle  5. 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 Appears to be a new event since it is occurring 20 yrs  after his previous rt testicular lymphoma  PLAN -Discussed pt labwork today, 02/26/20; of CBC w/diff and CMP is as follows: all values are WNL except for MCV at 101.9, Glucose at 107 -Discussed 02/26/20 of LDH at 161: WNL -Discussed 02/26/20 of MMP is as follows: all values are WNL except for IgM Srm at 798, M Protein SerPl Elph-Mcnc at 0.6 -Discussed 03/02/20 of PET Initial Skull Base to Thigh ([id number]) -Discussed 03/05/20 of Flow Pathology Report ([id number]) -Advised on Stage IIE of Diffuse large B-cell lymphoma  -Advised on Diffuse large B-cell lymphoma -primarily starting in testis- involvement of the epididymis and spermatic cord -Advised disease visible on pet scan has been surgically removed  -Advised on primary testicular lymphomas are less common and there arent clear data regarding best treatment for relapsed primary testicular large B cell lymphomas -Outcomes related to mass size, age, elevated LDH, stage 3-4 disease  -Discussed pt's primary treatment and good response -Advised R-CHOP typically cannot be repeated given there are life time limits on cumulative Adriamycin dosing.  -Educated on repeating treatment, life time maximums and cardiac risk  -Advised on treatment options including salvage chemotherapy regimen such as R-GCD vs considering R-CEOP (replacing Adriamycin with Etoposide). -Patient discussed option of his friend/oncologist at [facility] and I consult with my collegues at other universities as well. Since he response well to R-CHOP the 1st time we decided to go with R-CEOP (replacing Adriamycin for Etoposide) and with appropriate dose reductions for his age and bone marrow reserves. -given his age and previous chemotherapy exposure patient will require G-CSF support -Advised on dates of starting treatments -starting 3-4 weeks after surgery  -Advised on limiting crowd exposure -ex. airports -Educated no treatment would be high risk of recurring  -Educated on CNS  relapse and lack of data regarding benefits of rpt IT MTZ for CNS prophylaxis. -Dr. Jeffie Pollock will take lead on testosterone replacement  -Recommends getting PORT a cath placed for chemotherapy -Will set up for [facility], chemotherapy counseling  -Pt is thinking of traveling June 15th -20th   FOLLOW UP: Port a cath placement ASAP R-CEOP D1, Etopside D2 and D3 and D4- udenyca - as per orders - will need to start D1 on 03/25/2020 with labs and MD visit [facility]emo-counseling for R-CEOP ASAP MD in C1D12 with labs for toxicity check  The total time spent in the appt was 60 minutes and more than 50% was on counseling and direct patient cares.  All of the patient's questions were answered with apparent satisfaction. The patient knows to call the clinic with any problems, questions or concerns.   Sullivan Lone MD MS AAHIVMS [facility] Hematology/Oncology Physician [facility]  (Office):       [phone] (Work cell):  [phone] (Fax):           [phone]  03/12/2020 3:41 PM  I, Dawayne Cirri am acting as a Education administrator for Dr. Sullivan Lone.   .I have reviewed the above documentation for accuracy and completeness, and I agree with the above. Brunetta Genera MD

## 2020-03-11 NOTE — Progress Notes (Signed)
Provider:  Rexene [name]. Antonio [name], D.O., C.M.D. Location:  Occupational psychologist of Service:  Clinic (12)  Previous PCP: Antonio Curry, DO Patient Care Team: Antonio Curry, DO as PCP - General (Geriatric Medicine)  Extended Emergency Contact Information Primary Emergency Contact: Antonio [name] Address: [name] [address] [name] Phone: [phone] Relation: Spouse  Code Status: DNR, MOST Goals of Care: Antonio Directive information Antonio Directives 03/11/2020  Does Patient Have a Medical Advance Directive? Yes  Type of Advance Directive [facility] [name]  Does patient want to make changes to medical advance directive? No - Patient declined  Copy of [place] in Chart? Yes - validated most recent copy scanned in chart (See row information)  Pre-existing out of facility DNR order (yellow form or pink MOST form) -      Chief Complaint  Patient presents with  . Annual Exam    CPE    HPI: Patient is a 79 y.o. male seen today for an annual physical exam.  He found a spot in his testicle and mentioned it to Antonio [name].  They had just gotten through saying he could f/u in a year.   There was a mass there on [place].  Just had left orchiectomy on 5/20.  Only took two pain pills afterward.  He feels like it's an instant replay of 20 yrs ago.  Has appt with Antonio [name] tomorrow and Antonio [name] Friday.    He's very active, hunts, fishes, works on the farm.    He feels like he's slowed down very quickly.  He's not allowed to lift 10 lbs when he goes to work out.    He recalls having [facility] therapy before.  He went to [name] back then for a second opinion.  Options from one place were spinals and other was [facility].  He did both treatments back then.  He had been chairing the [name] board meeting and went on as normal back then though he was told he looked bad.      He called Antonio [name] who  treated his B cell lymphoma many years ago.  He reminded him that having his surgery will save him from getting prostate cancer and that many people his age do get some type of cancer.  They did touch on his Antonio age and impact on treatment.    I asked him about goals he has in life.  He and Antonio [name] are going to their grandson's graduation in [place] in a few days.  Then they go to [name] and then to [place] for hunting several weeks later (8wks out).  He'd like to keep that schedule.  He has always enjoyed staying busy and wants to squeeze his treatments in in b/w.  It has crossed his mind that should he even go through all of this.  He is waiting to see what the pathology shows which he finds out tomorrow.  He has his living will and HCPOA, as well as a MOST form on record.  He does not want his life prolonged in the event he's unable to communicate his wishes himself (cognitively).   He's had trouble with constipation since the surgery.  His wife is giving him some type of slurry that she mixes up that tastes terrible but may be helping--he feels like he's almost back on track.  He also reports more challenges sleeping  which had improved before, but has come back.  We discussed an anxiolytic, but he's going to hold off at this point.  He is using tylenol pm--risks of "feeling drunk"/hangover and falls reviewed.  He has a small raised spot at the end of his right eyebrow that is raised from when he fell and hit his head hunting several weeks ago.    He admits he does not do well with hydrating adequately and sometimes will find himself feeling really tired and lightheaded in the afternoons and drinks some water and feels better.  Past Medical History:  Diagnosis Date  . Age-related macular degeneration, dry, both eyes   . 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 Carotid stenosis, asymptomatic, left ?  . ED (erectile dysfunction)   . Elevated diaphragm    right  . Erectile dysfunction   . History of basal cell carcinoma (BCC) excision    multiple excision in office  . History of kidney stones   . History of seizure    03-03-2020 per pt x1 while taking interferan for melanoma in 2001 ,  none since, told a side effect of interferan  . History of squamous cell carcinoma excision    multiple skin excision in office  . Hyperlipidemia   . Hypertension    followed by pcp  (03-03-2020 per pt had a stress test approx. 2010, told normal)  . LBBB (left bundle branch block)   . Lymphoma of testis ([name]) 2000   s/p right orchiectomy   . Mass of left testis   . MGUS (monoclonal gammopathy of unknown significance)    oncologist--- dr Irene [name]  . Personal history of malignant melanoma of skin dermatologist--- dr Wilhemina Bonito   dx 2001 left calf area  s/p WLE ,  completed one year interferan (03-03-2020 pt states did not involve lymph nodes, no recurrence)  . Urgency of urination   . Wears hearing aid in both ears    Past Surgical History:  Procedure Laterality Date  . CATARACT EXTRACTION W/ INTRAOCULAR LENS IMPLANT Bilateral 2018  . MELANOMA EXCISION  10/1999  . ORCHIECTOMY Right 2000  . ORCHIECTOMY Left 03/05/2020   Procedure: ORCHIECTOMY;  Surgeon: Antonio Seal, MD;  Location: [name];  Service: Urology;  Laterality: Left;  . TONSILLECTOMY  child  . TOTAL KNEE ARTHROPLASTY Right 06/2009    reports that he has never smoked. He has never used smokeless tobacco. He reports current alcohol use of about 14.0 standard drinks of alcohol per week. He reports that he does not use drugs.  Functional Status Survey:    Family History  Problem Relation Age of Onset  . Cataracts Mother   . Transient ischemic attack Mother   . Asthma Mother   . Cataracts Father   . Diabetes Father         borderline  . Hypertension Father   . Hyperlipidemia Father   . Hyperlipidemia Son   . Hypertension Son   . Amblyopia Neg Hx   . Blindness Neg Hx   . Glaucoma Neg Hx   . Macular degeneration Neg Hx   . Retinal detachment Neg Hx   . Strabismus Neg Hx   . Retinitis pigmentosa Neg Hx     Health Maintenance  Topic Date Due  . INFLUENZA VACCINE  05/17/2020  . TETANUS/TDAP  03/04/2024  . COVID-19 Vaccine  Completed  . PNA vac Low Risk Adult  Completed    Allergies  Allergen Reactions  . Adhesive [Tape] Itching    And irritated    Outpatient Encounter Medications as of 03/11/2020  Medication Sig  . calcium carbonate (TUMS - DOSED IN MG ELEMENTAL CALCIUM) 500 MG chewable tablet Chew 1 tablet by mouth as needed for indigestion or heartburn.  . Cholecalciferol (VITAMIN D3) 2000 units TABS Take 1 tablet by mouth daily.  . Cyanocobalamin (B-12 PO) Take 1 tablet by mouth daily.  Marland Kitchen HYDROcodone-acetaminophen (NORCO/VICODIN) 5-325 MG tablet Take 1 tablet by mouth every 6 (six) hours as needed for moderate pain.  Marland Kitchen Ketotifen Fumarate (REFRESH EYE ITCH RELIEF OP) Apply to eye as needed.  Marland Kitchen losartan (COZAAR) 100 MG tablet Take 1 tablet (100 mg total) by mouth daily.  . meloxicam (MOBIC) 7.5 MG tablet TAKE 1 TABLET (7.5 MG TOTAL) BY MOUTH 2 (TWO) TIMES DAILY AS NEEDED FOR PAIN.  . Multiple Vitamins-Minerals (PRESERVISION AREDS 2 PO) Take 1 capsule by mouth 2 (two) times daily.   . simvastatin (ZOCOR) 40 MG tablet TAKE 1 TABLET BY MOUTH EVERY DAY  . valACYclovir (VALTREX) 1000 MG tablet Take one tablet by mouth twice daily for 3 days as needed for flares.   No facility-administered encounter medications on file as of 03/11/2020.    Review of Systems  Constitutional: Positive for malaise/fatigue. Negative for chills, fever and weight loss.  HENT: Positive for congestion and hearing loss. Negative for sore throat.        Chronic postnasal drip  Eyes: Negative for blurred vision.        Glasses  Respiratory: Negative for cough and shortness of breath.   Cardiovascular: Negative for chest pain, palpitations and leg swelling.  Gastrointestinal: Positive for constipation. Negative for abdominal pain, blood in stool, diarrhea and melena.  Genitourinary: Negative for dysuria.  Musculoskeletal: Positive for falls. Negative for back pain, joint pain, myalgias and neck pain.  Skin: Negative for itching and rash.    Vitals:   03/11/20 0957  BP: 118/78  Pulse: 75  Temp: (!) 97.2 F (36.2 C)  TempSrc: Temporal  SpO2: 97%  Weight: 191 lb 12.8 oz (87 kg)  Height: 6' (1.829 m)   Body mass index is 26.01 kg/m. Physical Exam Vitals reviewed.  Constitutional:      General: He is not in acute distress.    Appearance: Normal appearance. He is not ill-appearing or toxic-appearing.  HENT:     Head: Normocephalic and atraumatic.     Right Ear: There is impacted cerumen.     Ears:     Comments: A good bit of cerumen but I can see part of TM on left    Nose: Nose normal.     Mouth/Throat:     Pharynx: Oropharynx is clear.  Eyes:     Extraocular Movements: Extraocular movements intact.     Pupils: Pupils are equal, round, and reactive to light.  Neck:     Comments: No supraclavicular lymphadenopathy Cardiovascular:     Rate and Rhythm: Normal rate and regular rhythm.     Pulses: Normal pulses.     Heart sounds: Normal heart sounds. No murmur. No friction rub. No gallop.   Pulmonary:     Effort: Pulmonary effort is normal.     Breath sounds: Normal breath sounds. No wheezing, rhonchi or rales.  Abdominal:     General: Bowel  sounds are normal. There is no distension.     Palpations: Abdomen is soft. There is no mass.     Tenderness: There is no abdominal tenderness. There is no guarding or rebound.  Genitourinary:    Comments: S/p left orchiectomy--incision site clean, dry, no erythema, drainage, mild swelling Musculoskeletal:        General: Normal range of motion.      Right lower leg: No edema.     Left lower leg: No edema.  Lymphadenopathy:     Cervical: No cervical adenopathy.  Skin:    General: Skin is warm and dry.     Comments: Small raised area palpable at lateral aspect of right eyebrow  Neurological:     General: No focal deficit present.     Mental Status: He is alert and oriented to person, place, and time.     Cranial Nerves: No cranial nerve deficit.     Motor: No weakness.     Gait: Gait normal.  Psychiatric:        Mood and Affect: Mood normal.        Behavior: Behavior normal.     Comments: Tearful talking about his prior lymphoma and recent orchiectomy    Labs reviewed: Basic Metabolic Panel: Recent Labs    06/04/19 1100 12/19/19 1057 02/26/20 0901  NA 142 143 143  K 4.7 4.4 4.7  CL 108 106 105  CO2 24 27 26   GLUCOSE 85 79 107*  BUN 24* 18 15  CREATININE 0.80 0.80 0.82  CALCIUM 9.1 8.9 9.5   Liver Function Tests: Recent Labs    06/04/19 1100 12/19/19 1057 02/26/20 0901  AST 23 24 23   ALT 31 29 27   ALKPHOS 63 66 65  BILITOT 0.6 0.6 0.7  PROT 6.6 7.1 7.4  ALBUMIN 3.9 4.2 4.3   No results for input(s): LIPASE, AMYLASE in the last 8760 hours. No results for input(s): AMMONIA in the last 8760 hours. CBC: Recent Labs    06/04/19 1100 12/19/19 1057 02/26/20 0901  WBC 6.2 6.9 5.4  NEUTROABS 4.2 4.3 3.8  HGB 12.8* 13.9 14.2  HCT 39.2 42.8  40.8 43.7  MCV 101.0* 103.6* 101.9*  PLT 200 204 207   Cardiac Enzymes: No results for input(s): CKTOTAL, CKMB, CKMBINDEX, TROPONINI in the last 8760 hours. BNP: Invalid input(s): POCBNP Lab Results  Component Value Date   HGBA1C 5.7 02/27/2020   Lab Results  Component Value Date   TSH 1.569 11/12/2018   Lab Results  Component Value Date   VITAMINB12 882 12/19/2019    Imaging and Procedures Recently: Reviewed notes from Antonio [name] and [name], surgery, labs  Assessment/Plan 1. Annual physical exam -performed today, remains in good physical health  outside of his recent orchiectomy for suspected recurrent lymphoma  2. Testicular mass -just found this month and had left orchiectomy   3. S/P orchiectomy -5/20, recovering well  4. History of B-cell lymphoma -suspicion is that he has recurrence in left testicle--pathology pending  5. Monoclonal gammopathy of unknown significance (MGUS) -being monitored by Antonio [name] with hematology and latest screening labs did not show any progression to myeloma  6. Slow transit constipation -some difficulty postop and gradually getting back on track--his wife is making him a beverage that is helping  7. Bilateral impacted cerumen -both ears contained a large amount of wax and they were flushed with warm water and peroxide successfully  8. ACP (advance care planning) -discussed some of his goals, previously completed  a MOST form and he does have his living will and HCPOA on file  -he has several things he wants to do coming up in just the next few weeks -spent 20 mins on this portion of visit today  Labs/tests ordered: no new F/u routinely for 6 mos but may call me or come in prn, as well  Antonio [name], D.O. [facility] [address], [place] [address] Cell Phone (Mon-Fri 8am-5pm):  [phone] On Call:  [phone] & follow prompts after 5pm & weekends Office Phone:  [phone] Office Fax:  [phone]

## 2020-03-12 ENCOUNTER — Inpatient Hospital Stay: Payer: PPO | Admitting: Hematology

## 2020-03-12 ENCOUNTER — Other Ambulatory Visit: Payer: Self-pay

## 2020-03-12 VITALS — BP 143/86 | HR 72 | Temp 97.7°F | Resp 18 | Ht 72.0 in | Wt 192.9 lb

## 2020-03-12 DIAGNOSIS — M899 Disorder of bone, unspecified: Secondary | ICD-10-CM | POA: Diagnosis not present

## 2020-03-12 DIAGNOSIS — C8339 Diffuse large B-cell lymphoma, extranodal and solid organ sites: Secondary | ICD-10-CM

## 2020-03-12 DIAGNOSIS — D4959 Neoplasm of unspecified behavior of other genitourinary organ: Secondary | ICD-10-CM

## 2020-03-15 ENCOUNTER — Encounter: Payer: Self-pay | Admitting: Hematology

## 2020-03-17 ENCOUNTER — Other Ambulatory Visit: Payer: Self-pay | Admitting: Hematology

## 2020-03-17 DIAGNOSIS — C83398 Diffuse large b-cell lymphoma of other extranodal and solid organ sites: Secondary | ICD-10-CM

## 2020-03-17 DIAGNOSIS — C8339 Diffuse large B-cell lymphoma, extranodal and solid organ sites: Secondary | ICD-10-CM

## 2020-03-17 DIAGNOSIS — C8330 Diffuse large B-cell lymphoma, unspecified site: Secondary | ICD-10-CM | POA: Insufficient documentation

## 2020-03-17 DIAGNOSIS — C6210 Malignant neoplasm of unspecified descended testis: Secondary | ICD-10-CM

## 2020-03-17 DIAGNOSIS — C6290 Malignant neoplasm of unspecified testis, unspecified whether descended or undescended: Secondary | ICD-10-CM | POA: Insufficient documentation

## 2020-03-17 DIAGNOSIS — Z7189 Other specified counseling: Secondary | ICD-10-CM

## 2020-03-17 NOTE — Progress Notes (Signed)
START ON PATHWAY REGIMEN - Lymphoma and CLL     A cycle is every 21 days:     Rituximab-xxxx      Cyclophosphamide      Vincristine      Etoposide      Prednisone   **Always confirm dose/schedule in your pharmacy ordering system**  Patient Characteristics: Diffuse Large B-Cell Lymphoma or Follicular Lymphoma, Grade 3B, First Line, Stage I and II, No Bulk Disease Type: Not Applicable Disease Type: Diffuse Large B-Cell Lymphoma Disease Type: Not Applicable Line of therapy: First Line [place] Stage: IIE Disease Characteristics: No Bulk Intent of Therapy: Curative Intent, Discussed with Patient

## 2020-03-20 ENCOUNTER — Encounter (HOSPITAL_COMMUNITY): Payer: Self-pay | Admitting: Hematology

## 2020-03-23 ENCOUNTER — Other Ambulatory Visit: Payer: Self-pay | Admitting: Hematology

## 2020-03-23 ENCOUNTER — Encounter: Payer: Self-pay | Admitting: Pharmacist

## 2020-03-23 DIAGNOSIS — E291 Testicular hypofunction: Secondary | ICD-10-CM | POA: Diagnosis not present

## 2020-03-23 MED ORDER — ETOPOSIDE 50 MG PO CAPS: 100.0000 mg/m2/d | ORAL_CAPSULE | Freq: Every day | ORAL | 0 refills | Status: AC

## 2020-03-23 NOTE — Telephone Encounter (Signed)
Error

## 2020-03-24 ENCOUNTER — Encounter: Payer: Self-pay | Admitting: Hematology

## 2020-03-24 ENCOUNTER — Other Ambulatory Visit: Payer: Self-pay | Admitting: *Deleted

## 2020-03-24 ENCOUNTER — Other Ambulatory Visit: Payer: Self-pay | Admitting: Hematology

## 2020-03-24 ENCOUNTER — Telehealth: Payer: Self-pay | Admitting: *Deleted

## 2020-03-24 DIAGNOSIS — C8339 Diffuse large B-cell lymphoma, extranodal and solid organ sites: Secondary | ICD-10-CM

## 2020-03-24 NOTE — Telephone Encounter (Signed)
Contacted patient in response to MyChart questions - Dr.Kale provided following answers. No answer, LVM and advised that response being sent via MyChart. Okay to start testosterone gel. Udenyca (biosimilar for neulasta) is being recommended and scheduled to reduce his risk of infection. WIll be need for his treatment regimen and given his age.

## 2020-03-25 ENCOUNTER — Other Ambulatory Visit: Payer: Self-pay

## 2020-03-25 ENCOUNTER — Ambulatory Visit (HOSPITAL_BASED_OUTPATIENT_CLINIC_OR_DEPARTMENT_OTHER): Payer: PPO | Admitting: Medical

## 2020-03-25 ENCOUNTER — Inpatient Hospital Stay: Payer: PPO

## 2020-03-25 ENCOUNTER — Inpatient Hospital Stay: Payer: PPO | Attending: Hematology

## 2020-03-25 ENCOUNTER — Other Ambulatory Visit: Payer: Self-pay | Admitting: Hematology

## 2020-03-25 VITALS — BP 148/93 | HR 72 | Temp 98.1°F | Resp 16

## 2020-03-25 DIAGNOSIS — C8339 Diffuse large B-cell lymphoma, extranodal and solid organ sites: Secondary | ICD-10-CM

## 2020-03-25 DIAGNOSIS — C6210 Malignant neoplasm of unspecified descended testis: Secondary | ICD-10-CM

## 2020-03-25 DIAGNOSIS — Z79899 Other long term (current) drug therapy: Secondary | ICD-10-CM | POA: Insufficient documentation

## 2020-03-25 DIAGNOSIS — Z5112 Encounter for antineoplastic immunotherapy: Secondary | ICD-10-CM | POA: Diagnosis not present

## 2020-03-25 DIAGNOSIS — Z5189 Encounter for other specified aftercare: Secondary | ICD-10-CM | POA: Diagnosis not present

## 2020-03-25 DIAGNOSIS — Z7189 Other specified counseling: Secondary | ICD-10-CM

## 2020-03-25 DIAGNOSIS — T8090XA Unspecified complication following infusion and therapeutic injection, initial encounter: Secondary | ICD-10-CM

## 2020-03-25 DIAGNOSIS — Z5111 Encounter for antineoplastic chemotherapy: Secondary | ICD-10-CM | POA: Diagnosis not present

## 2020-03-25 LAB — HEPATITIS B SURFACE ANTIGEN: Hepatitis B Surface Ag: NONREACTIVE

## 2020-03-25 LAB — CBC WITH DIFFERENTIAL (CANCER CENTER ONLY)
Abs Immature Granulocytes: 0.03 10*3/uL (ref 0.00–0.07)
Basophils Absolute: 0.1 10*3/uL (ref 0.0–0.1)
Basophils Relative: 1 %
Eosinophils Absolute: 0.1 10*3/uL (ref 0.0–0.5)
Eosinophils Relative: 2 %
HCT: 42.2 % (ref 39.0–52.0)
Hemoglobin: 13.8 g/dL (ref 13.0–17.0)
Immature Granulocytes: 1 %
Lymphocytes Relative: 22 %
Lymphs Abs: 1.3 10*3/uL (ref 0.7–4.0)
MCH: 32.8 pg (ref 26.0–34.0)
MCHC: 32.7 g/dL (ref 30.0–36.0)
MCV: 100.2 fL — ABNORMAL HIGH (ref 80.0–100.0)
Monocytes Absolute: 0.7 10*3/uL (ref 0.1–1.0)
Monocytes Relative: 11 %
Neutro Abs: 3.8 10*3/uL (ref 1.7–7.7)
Neutrophils Relative %: 63 %
Platelet Count: 194 10*3/uL (ref 150–400)
RBC: 4.21 MIL/uL — ABNORMAL LOW (ref 4.22–5.81)
RDW: 12.6 % (ref 11.5–15.5)
WBC Count: 5.9 10*3/uL (ref 4.0–10.5)
nRBC: 0 % (ref 0.0–0.2)

## 2020-03-25 LAB — CMP (CANCER CENTER ONLY)
ALT: 33 U/L (ref 0–44)
AST: 26 U/L (ref 15–41)
Albumin: 3.9 g/dL (ref 3.5–5.0)
Alkaline Phosphatase: 71 U/L (ref 38–126)
Anion gap: 13 (ref 5–15)
BUN: 17 mg/dL (ref 8–23)
CO2: 23 mmol/L (ref 22–32)
Calcium: 10 mg/dL (ref 8.9–10.3)
Chloride: 108 mmol/L (ref 98–111)
Creatinine: 0.92 mg/dL (ref 0.61–1.24)
GFR, Est AFR Am: >60 mL/min (ref >60)
GFR, Estimated: >60 mL/min (ref >60)
Glucose, Bld: 125 mg/dL — ABNORMAL HIGH (ref 70–99)
Potassium: 4.3 mmol/L (ref 3.5–5.1)
Sodium: 144 mmol/L (ref 135–145)
Total Bilirubin: 0.6 mg/dL (ref 0.3–1.2)
Total Protein: 7 g/dL (ref 6.5–8.1)

## 2020-03-25 LAB — HEPATITIS B CORE ANTIBODY, TOTAL: Hep B Core Total Ab: NONREACTIVE

## 2020-03-25 MED ORDER — SODIUM CHLORIDE 0.9 % IV SOLN
500.0000 mg/m2 | Freq: Once | INTRAVENOUS | Status: AC
  Administered 2020-03-25: 1060 mg via INTRAVENOUS
  Filled 2020-03-25: qty 53

## 2020-03-25 MED ORDER — SODIUM CHLORIDE 0.9 % IV SOLN
Freq: Once | INTRAVENOUS | Status: AC
  Filled 2020-03-25: qty 250

## 2020-03-25 MED ORDER — ONDANSETRON HCL 8 MG PO TABS: 8.0000 mg | ORAL_TABLET | Freq: Three times a day (TID) | ORAL | 0 refills | Status: DC | PRN

## 2020-03-25 MED ORDER — PALONOSETRON HCL INJECTION 0.25 MG/5ML
0.2500 mg | Freq: Once | INTRAVENOUS | Status: AC
  Administered 2020-03-25: 0.25 mg via INTRAVENOUS

## 2020-03-25 MED ORDER — FAMOTIDINE IN NACL 20-0.9 MG/50ML-% IV SOLN
20.0000 mg | Freq: Once | INTRAVENOUS | Status: AC | PRN
  Administered 2020-03-25: 20 mg via INTRAVENOUS

## 2020-03-25 MED ORDER — SODIUM CHLORIDE 0.9 % IV SOLN
375.0000 mg/m2 | Freq: Once | INTRAVENOUS | Status: AC
  Administered 2020-03-25: 800 mg via INTRAVENOUS
  Filled 2020-03-25: qty 50

## 2020-03-25 MED ORDER — VINCRISTINE SULFATE CHEMO INJECTION 1 MG/ML
1.0000 mg | Freq: Once | INTRAVENOUS | Status: AC
  Administered 2020-03-25: 1 mg via INTRAVENOUS
  Filled 2020-03-25: qty 1

## 2020-03-25 MED ORDER — METHYLPREDNISOLONE SODIUM SUCC 125 MG IJ SOLR
60.0000 mg | Freq: Once | INTRAMUSCULAR | Status: AC
  Administered 2020-03-25: 60 mg via INTRAVENOUS

## 2020-03-25 MED ORDER — PREDNISONE 20 MG PO TABS: 60.0000 mg | ORAL_TABLET | Freq: Every day | ORAL | 5 refills | Status: DC

## 2020-03-25 MED ORDER — PALONOSETRON HCL INJECTION 0.25 MG/5ML
INTRAVENOUS | Status: AC
  Filled 2020-03-25: qty 5

## 2020-03-25 MED ORDER — DIPHENHYDRAMINE HCL 25 MG PO CAPS
ORAL_CAPSULE | ORAL | Status: AC
  Filled 2020-03-25: qty 2

## 2020-03-25 MED ORDER — ACETAMINOPHEN 325 MG PO TABS
650.0000 mg | ORAL_TABLET | Freq: Once | ORAL | Status: AC
  Administered 2020-03-25: 650 mg via ORAL

## 2020-03-25 MED ORDER — SODIUM CHLORIDE 0.9 % IV SOLN
75.0000 mg/m2 | Freq: Once | INTRAVENOUS | Status: AC
  Administered 2020-03-25: 160 mg via INTRAVENOUS
  Filled 2020-03-25: qty 8

## 2020-03-25 MED ORDER — DIPHENHYDRAMINE HCL 25 MG PO CAPS
50.0000 mg | ORAL_CAPSULE | Freq: Once | ORAL | Status: AC
  Administered 2020-03-25: 50 mg via ORAL

## 2020-03-25 MED ORDER — SODIUM CHLORIDE 0.9 % IV SOLN
10.0000 mg | Freq: Once | INTRAVENOUS | Status: AC
  Administered 2020-03-25: 10 mg via INTRAVENOUS
  Filled 2020-03-25: qty 10

## 2020-03-25 MED ORDER — PROCHLORPERAZINE MALEATE 10 MG PO TABS: 10.0000 mg | ORAL_TABLET | Freq: Four times a day (QID) | ORAL | 0 refills | Status: DC | PRN

## 2020-03-25 MED ORDER — LIDOCAINE-PRILOCAINE 2.5-2.5 % EX KIT
PACK | Freq: Once | CUTANEOUS | 2 refills | Status: AC
Start: 2020-03-25 — End: 2020-03-25

## 2020-03-25 MED ORDER — ACETAMINOPHEN 325 MG PO TABS
ORAL_TABLET | ORAL | Status: AC
  Filled 2020-03-25: qty 2

## 2020-03-25 NOTE — Patient Instructions (Signed)
[name]charge Instructions for Patients Receiving Chemotherapy  Today you received the following chemotherapy agents Vincristine, Cytoxan, Etoposide and Rituximab  To help prevent nausea and vomiting after your treatment, we encourage you to take your nausea medication as directed. No Zofran for 3 days. Take Compazine instead.    If you develop nausea and vomiting that is not controlled by your nausea medication, call the clinic.   BELOW ARE SYMPTOMS THAT SHOULD BE REPORTED IMMEDIATELY:  *FEVER GREATER THAN 100.5 F  *CHILLS WITH OR WITHOUT FEVER  NAUSEA AND VOMITING THAT IS NOT CONTROLLED WITH YOUR NAUSEA MEDICATION  *UNUSUAL SHORTNESS OF BREATH  *UNUSUAL BRUISING OR BLEEDING  TENDERNESS IN MOUTH AND THROAT WITH OR WITHOUT PRESENCE OF ULCERS  *URINARY PROBLEMS  *BOWEL PROBLEMS  UNUSUAL RASH Items with * indicate a potential emergency and should be followed up as soon as possible.  Feel free to call the clinic should you have any questions or concerns. The clinic phone number is [phone].  Please show the [place] at check-in to the Emergency Department and triage nurse.   Vincristine injection What is this medicine? VINCRISTINE (vin KRIS teen) is a chemotherapy drug. It slows the growth of cancer cells. This medicine is used to treat many types of cancer like Hodgkin's disease, leukemia, non-Hodgkin's lymphoma, neuroblastoma (brain cancer), rhabdomyosarcoma, and Wilms' tumor. This medicine may be used for other purposes; ask your health care provider or pharmacist if you have questions. COMMON BRAND NAME(S): Oncovin, Vincasar PFS What should I tell my health care provider before I take this medicine? They need to know if you have any of these conditions:  blood disorders  gout  infection (especially chickenpox, cold sores, or herpes)  kidney disease  liver disease  lung disease  nervous system disease like Charcot-Marie-Tooth  (CMT)  recent or ongoing radiation therapy  an unusual or allergic reaction to vincristine, other chemotherapy agents, other medicines, foods, dyes, or preservatives  pregnant or trying to get pregnant  breast-feeding How should I use this medicine? This drug is given as an infusion into a vein. It is administered in a hospital or clinic by a specially trained health care professional. If you have pain, swelling, burning, or any unusual feeling around the site of your injection, tell your health care professional right away. Talk to your pediatrician regarding the use of this medicine in children. While this drug may be prescribed for selected conditions, precautions do apply. Overdosage: If you think you have taken too much of this medicine contact a poison control center or emergency room at once. NOTE: This medicine is only for you. Do not share this medicine with others. What if I miss a dose? It is important not to miss your dose. Call your doctor or health care professional if you are unable to keep an appointment. What may interact with this medicine? Do not take this medicine with any of the following medications:  itraconazole  mibefradil  voriconazole This medicine may also interact with the following medications:  cyclosporine  erythromycin  fluconazole  ketoconazole  medicines for HIV like delavirdine, efavirenz, nevirapine  medicines for seizures like ethotoin, fosphenotoin, phenytoin  medicines to increase blood counts like filgrastim, pegfilgrastim, sargramostim  other chemotherapy drugs like cisplatin, L-asparaginase, methotrexate, mitomycin, paclitaxel  pegaspargase  vaccines  zalcitabine, ddC Talk to your doctor or health care professional before taking any of these medicines:  acetaminophen  aspirin  ibuprofen  ketoprofen  naproxen This list may not  describe all possible interactions. Give your health care provider a list of all the  medicines, herbs, non-prescription drugs, or dietary supplements you use. Also tell them if you smoke, drink alcohol, or use illegal drugs. Some items may interact with your medicine. What should I watch for while using this medicine? This drug may make you feel generally unwell. This is not uncommon, as chemotherapy can affect healthy cells as well as cancer cells. Report any side effects. Continue your course of treatment even though you feel ill unless your doctor tells you to stop. You may need blood work done while you are taking this medicine. This medicine will cause constipation. Try to have a bowel movement at least every 2 to 3 days. If you do not have a bowel movement for 3 days, call your doctor or health care professional. In some cases, you may be given additional medicines to help with side effects. Follow all directions for their use. Do not become pregnant while taking this medicine. Women should inform their doctor if they wish to become pregnant or think they might be pregnant. There is a potential for serious side effects to an unborn child. Talk to your health care professional or pharmacist for more information. Do not breast-feed an infant while taking this medicine. This medicine may make it more difficult to get pregnant or to father a child. Talk to your healthcare professional if you are concerned about your fertility. What side effects may I notice from receiving this medicine? Side effects that you should report to your doctor or health care professional as soon as possible:  allergic reactions like skin rash, itching or hives, swelling of the face, lips, or tongue  breathing problems  confusion or changes in emotions or moods  constipation  cough  mouth sores  muscle weakness  nausea and vomiting  pain, swelling, redness or irritation at the injection site  pain, tingling, numbness in the hands or feet  problems with balance, talking,  walking  seizures  stomach pain  trouble passing urine or change in the amount of urine Side effects that usually do not require medical attention (report to your doctor or health care professional if they continue or are bothersome):  diarrhea  hair loss  jaw pain  loss of appetite This list may not describe all possible side effects. Call your doctor for medical advice about side effects. You may report side effects to FDA at 1-800-FDA-1088. Where should I keep my medicine? This drug is given in a hospital or clinic and will not be stored at home. NOTE: This sheet is a summary. It may not cover all possible information. If you have questions about this medicine, talk to your doctor, pharmacist, or health care provider.  2020 Elsevier/Gold Standard (2018-07-06 08:55:02)  Cyclophosphamide Injection What is this medicine? CYCLOPHOSPHAMIDE (sye kloe FOSS fa mide) is a chemotherapy drug. It slows the growth of cancer cells. This medicine is used to treat many types of cancer like lymphoma, myeloma, leukemia, breast cancer, and ovarian cancer, to name a few. This medicine may be used for other purposes; ask your health care provider or pharmacist if you have questions. COMMON BRAND NAME(S): Cytoxan, Neosar What should I tell my health care provider before I take this medicine? They need to know if you have any of these conditions:  heart disease  history of irregular heartbeat  infection  kidney disease  liver disease  low blood counts, like white cells, platelets, or red blood cells  on hemodialysis  recent or ongoing radiation therapy  scarring or thickening of the lungs  trouble passing urine  an unusual or allergic reaction to cyclophosphamide, other medicines, foods, dyes, or preservatives  pregnant or trying to get pregnant  breast-feeding How should I use this medicine? This drug is usually given as an injection into a vein or muscle or by infusion into a  vein. It is administered in a hospital or clinic by a specially trained health care professional. Talk to your pediatrician regarding the use of this medicine in children. Special care may be needed. Overdosage: If you think you have taken too much of this medicine contact a poison control center or emergency room at once. NOTE: This medicine is only for you. Do not share this medicine with others. What if I miss a dose? It is important not to miss your dose. Call your doctor or health care professional if you are unable to keep an appointment. What may interact with this medicine?  amphotericin B  azathioprine  certain antivirals for HIV or hepatitis  certain medicines for blood pressure, heart disease, irregular heart beat  certain medicines that treat or prevent blood clots like warfarin  certain other medicines for cancer  cyclosporine  etanercept  indomethacin  medicines that relax muscles for surgery  medicines to increase blood counts  metronidazole This list may not describe all possible interactions. Give your health care provider a list of all the medicines, herbs, non-prescription drugs, or dietary supplements you use. Also tell them if you smoke, drink alcohol, or use illegal drugs. Some items may interact with your medicine. What should I watch for while using this medicine? Your condition will be monitored carefully while you are receiving this medicine. You may need blood work done while you are taking this medicine. Drink water or other fluids as directed. Urinate often, even at night. Some products may contain alcohol. Ask your health care professional if this medicine contains alcohol. Be sure to tell all health care professionals you are taking this medicine. Certain medicines, like metronidazole and disulfiram, can cause an unpleasant reaction when taken with alcohol. The reaction includes flushing, headache, nausea, vomiting, sweating, and increased thirst.  The reaction can last from 30 minutes to several hours. Do not become pregnant while taking this medicine or for 1 year after stopping it. Women should inform their health care professional if they wish to become pregnant or think they might be pregnant. Men should not father a child while taking this medicine and for 4 months after stopping it. There is potential for serious side effects to an unborn child. Talk to your health care professional for more information. Do not breast-feed an infant while taking this medicine or for 1 week after stopping it. This medicine has caused ovarian failure in some women. This medicine may make it more difficult to get pregnant. Talk to your health care professional if you are concerned about your fertility. This medicine has caused decreased sperm counts in some men. This may make it more difficult to father a child. Talk to your health care professional if you are concerned about your fertility. Call your health care professional for advice if you get a fever, chills, or sore throat, or other symptoms of a cold or flu. Do not treat yourself. This medicine decreases your body's ability to fight infections. Try to avoid being around people who are sick. Avoid taking medicines that contain aspirin, acetaminophen, ibuprofen, naproxen, or ketoprofen unless instructed by  your health care professional. These medicines may hide a fever. Talk to your health care professional about your risk of cancer. You may be more at risk for certain types of cancer if you take this medicine. If you are going to need surgery or other procedure, tell your health care professional that you are using this medicine. Be careful brushing or flossing your teeth or using a toothpick because you may get an infection or bleed more easily. If you have any dental work done, tell your dentist you are receiving this medicine. What side effects may I notice from receiving this medicine? Side effects  that you should report to your doctor or health care professional as soon as possible:  allergic reactions like skin rash, itching or hives, swelling of the face, lips, or tongue  breathing problems  nausea, vomiting  signs and symptoms of bleeding such as bloody or black, tarry stools; red or dark brown urine; spitting up blood or brown material that looks like coffee grounds; red spots on the skin; unusual bruising or bleeding from the eyes, gums, or nose  signs and symptoms of heart failure like fast, irregular heartbeat, sudden weight gain; swelling of the ankles, feet, hands  signs and symptoms of infection like fever; chills; cough; sore throat; pain or trouble passing urine  signs and symptoms of kidney injury like trouble passing urine or change in the amount of urine  signs and symptoms of liver injury like dark yellow or brown urine; general ill feeling or flu-like symptoms; light-colored stools; loss of appetite; nausea; right upper belly pain; unusually weak or tired; yellowing of the eyes or skin Side effects that usually do not require medical attention (report to your doctor or health care professional if they continue or are bothersome):  confusion  decreased hearing  diarrhea  facial flushing  hair loss  headache  loss of appetite  missed menstrual periods  signs and symptoms of low red blood cells or anemia such as unusually weak or tired; feeling faint or lightheaded; falls  skin discoloration This list may not describe all possible side effects. Call your doctor for medical advice about side effects. You may report side effects to FDA at 1-800-FDA-1088. Where should I keep my medicine? This drug is given in a hospital or clinic and will not be stored at home. NOTE: This sheet is a summary. It may not cover all possible information. If you have questions about this medicine, talk to your doctor, pharmacist, or health care provider.  2020 Elsevier/Gold  Standard (2019-07-08 09:53:29)  Etoposide, VP-16 injection What is this medicine? ETOPOSIDE, VP-16 (e toe POE side) is a chemotherapy drug. It is used to treat testicular cancer, lung cancer, and other cancers. This medicine may be used for other purposes; ask your health care provider or pharmacist if you have questions. COMMON BRAND NAME(S): Etopophos, Toposar, VePesid What should I tell my health care provider before I take this medicine? They need to know if you have any of these conditions:  infection  kidney disease  liver disease  low blood counts, like low white cell, platelet, or red cell counts  an unusual or allergic reaction to etoposide, other medicines, foods, dyes, or preservatives  pregnant or trying to get pregnant  breast-feeding How should I use this medicine? This medicine is for infusion into a vein. It is administered in a hospital or clinic by a specially trained health care professional. Talk to your pediatrician regarding the use of this medicine  in children. Special care may be needed. Overdosage: If you think you have taken too much of this medicine contact a poison control center or emergency room at once. NOTE: This medicine is only for you. Do not share this medicine with others. What if I miss a dose? It is important not to miss your dose. Call your doctor or health care professional if you are unable to keep an appointment. What may interact with this medicine? This medicine may interact with the following medications:  warfarin This list may not describe all possible interactions. Give your health care provider a list of all the medicines, herbs, non-prescription drugs, or dietary supplements you use. Also tell them if you smoke, drink alcohol, or use illegal drugs. Some items may interact with your medicine. What should I watch for while using this medicine? Visit your doctor for checks on your progress. This drug may make you feel generally  unwell. This is not uncommon, as chemotherapy can affect healthy cells as well as cancer cells. Report any side effects. Continue your course of treatment even though you feel ill unless your doctor tells you to stop. In some cases, you may be given additional medicines to help with side effects. Follow all directions for their use. Call your doctor or health care professional for advice if you get a fever, chills or sore throat, or other symptoms of a cold or flu. Do not treat yourself. This drug decreases your body's ability to fight infections. Try to avoid being around people who are sick. This medicine may increase your risk to bruise or bleed. Call your doctor or health care professional if you notice any unusual bleeding. Talk to your doctor about your risk of cancer. You may be more at risk for certain types of cancers if you take this medicine. Do not become pregnant while taking this medicine or for at least 6 months after stopping it. Women should inform their doctor if they wish to become pregnant or think they might be pregnant. Women of child-bearing potential will need to have a negative pregnancy test before starting this medicine. There is a potential for serious side effects to an unborn child. Talk to your health care professional or pharmacist for more information. Do not breast-feed an infant while taking this medicine. Men must use a latex condom during sexual contact with a woman while taking this medicine and for at least 4 months after stopping it. A latex condom is needed even if you have had a vasectomy. Contact your doctor right away if your partner becomes pregnant. Do not donate sperm while taking this medicine and for at least 4 months after you stop taking this medicine. Men should inform their doctors if they wish to father a child. This medicine may lower sperm counts. What side effects may I notice from receiving this medicine? Side effects that you should report to your  doctor or health care professional as soon as possible:  allergic reactions like skin rash, itching or hives, swelling of the face, lips, or tongue  low blood counts - this medicine may decrease the number of white blood cells, red blood cells, and platelets. You may be at increased risk for infections and bleeding  nausea, vomiting  redness, blistering, peeling or loosening of the skin, including inside the mouth  signs and symptoms of infection like fever; chills; cough; sore throat; pain or trouble passing urine  signs and symptoms of low red blood cells or anemia such as unusually  weak or tired; feeling faint or lightheaded; falls; breathing problems  unusual bruising or bleeding Side effects that usually do not require medical attention (report to your doctor or health care professional if they continue or are bothersome):  changes in taste  diarrhea  hair loss  loss of appetite  mouth sores This list may not describe all possible side effects. Call your doctor for medical advice about side effects. You may report side effects to FDA at 1-800-FDA-1088. Where should I keep my medicine? This drug is given in a hospital or clinic and will not be stored at home. NOTE: This sheet is a summary. It may not cover all possible information. If you have questions about this medicine, talk to your doctor, pharmacist, or health care provider.  2020 Elsevier/Gold Standard (2018-11-28 16:57:15)  Rituximab injection What is this medicine? RITUXIMAB (ri TUX i mab) is a monoclonal antibody. It is used to treat certain types of cancer like non-Hodgkin lymphoma and chronic lymphocytic leukemia. It is also used to treat rheumatoid arthritis, granulomatosis with polyangiitis (or Wegener's granulomatosis), microscopic polyangiitis, and pemphigus vulgaris. This medicine may be used for other purposes; ask your health care provider or pharmacist if you have questions. COMMON BRAND NAME(S): Rituxan,  RUXIENCE What should I tell my health care provider before I take this medicine? They need to know if you have any of these conditions:  heart disease  infection (especially a virus infection such as hepatitis B, chickenpox, cold sores, or herpes)  immune system problems  irregular heartbeat  kidney disease  low blood counts, like low white cell, platelet, or red cell counts  lung or breathing disease, like asthma  recently received or scheduled to receive a vaccine  an unusual or allergic reaction to rituximab, other medicines, foods, dyes, or preservatives  pregnant or trying to get pregnant  breast-feeding How should I use this medicine? This medicine is for infusion into a vein. It is administered in a hospital or clinic by a specially trained health care professional. A special MedGuide will be given to you by the pharmacist with each prescription and refill. Be sure to read this information carefully each time. Talk to your pediatrician regarding the use of this medicine in children. This medicine is not approved for use in children. Overdosage: If you think you have taken too much of this medicine contact a poison control center or emergency room at once. NOTE: This medicine is only for you. Do not share this medicine with others. What if I miss a dose? It is important not to miss a dose. Call your doctor or health care professional if you are unable to keep an appointment. What may interact with this medicine?  cisplatin  live virus vaccines This list may not describe all possible interactions. Give your health care provider a list of all the medicines, herbs, non-prescription drugs, or dietary supplements you use. Also tell them if you smoke, drink alcohol, or use illegal drugs. Some items may interact with your medicine. What should I watch for while using this medicine? Your condition will be monitored carefully while you are receiving this medicine. You may need  blood work done while you are taking this medicine. This medicine can cause serious allergic reactions. To reduce your risk you may need to take medicine before treatment with this medicine. Take your medicine as directed. In some patients, this medicine may cause a serious brain infection that may cause death. If you have any problems seeing, thinking, speaking,  walking, or standing, tell your healthcare professional right away. If you cannot reach your healthcare professional, urgently seek other source of medical care. Call your doctor or health care professional for advice if you get a fever, chills or sore throat, or other symptoms of a cold or flu. Do not treat yourself. This drug decreases your body's ability to fight infections. Try to avoid being around people who are sick. Do not become pregnant while taking this medicine or for at least 12 months after stopping it. Women should inform their doctor if they wish to become pregnant or think they might be pregnant. There is a potential for serious side effects to an unborn child. Talk to your health care professional or pharmacist for more information. Do not breast-feed an infant while taking this medicine or for at least 6 months after stopping it. What side effects may I notice from receiving this medicine? Side effects that you should report to your doctor or health care professional as soon as possible:  allergic reactions like skin rash, itching or hives; swelling of the face, lips, or tongue  breathing problems  chest pain  changes in vision  diarrhea  headache with fever, neck stiffness, sensitivity to light, nausea, or confusion  fast, irregular heartbeat  loss of memory  low blood counts - this medicine may decrease the number of white blood cells, red blood cells and platelets. You may be at increased risk for infections and bleeding.  mouth sores  problems with balance, talking, or walking  redness, blistering,  peeling or loosening of the skin, including inside the mouth  signs of infection - fever or chills, cough, sore throat, pain or difficulty passing urine  signs and symptoms of kidney injury like trouble passing urine or change in the amount of urine  signs and symptoms of liver injury like dark yellow or brown urine; general ill feeling or flu-like symptoms; light-colored stools; loss of appetite; nausea; right upper belly pain; unusually weak or tired; yellowing of the eyes or skin  signs and symptoms of low blood pressure like dizziness; feeling faint or lightheaded, falls; unusually weak or tired  stomach pain  swelling of the ankles, feet, hands  unusual bleeding or bruising  vomiting Side effects that usually do not require medical attention (report to your doctor or health care professional if they continue or are bothersome):  headache  joint pain  muscle cramps or muscle pain  nausea  tiredness This list may not describe all possible side effects. Call your doctor for medical advice about side effects. You may report side effects to FDA at 1-800-FDA-1088. Where should I keep my medicine? This drug is given in a hospital or clinic and will not be stored at home. NOTE: This sheet is a summary. It may not cover all possible information. If you have questions about this medicine, talk to your doctor, pharmacist, or health care provider.  2020 Elsevier/Gold Standard (2018-11-14 22:01:36)

## 2020-03-25 NOTE — Progress Notes (Signed)
1602: Pt complained of itchiness on one leg while RN was starting Rituxan at 300mg  (134ml/hr) rate. RN noticed red welts on leg. Rituxan paused, VS obtained (see flowsheets), Sandi Mealy, PA notified.  1606: Sandi Mealy, PA at bedside. NS started, pepcid given at 1606. 60mg  solumedrol given (see eMAR).  1637: Per Sandi Mealy, PA ok to restart Rituxan at 250mg  (159ml/hr) rate. Will continue to monitor.

## 2020-03-26 ENCOUNTER — Encounter: Payer: Self-pay | Admitting: Hematology

## 2020-03-26 ENCOUNTER — Other Ambulatory Visit: Payer: Self-pay

## 2020-03-26 ENCOUNTER — Inpatient Hospital Stay: Payer: PPO

## 2020-03-26 VITALS — BP 148/86 | HR 83 | Temp 98.5°F | Resp 17

## 2020-03-26 DIAGNOSIS — C6210 Malignant neoplasm of unspecified descended testis: Secondary | ICD-10-CM

## 2020-03-26 DIAGNOSIS — C8339 Diffuse large B-cell lymphoma, extranodal and solid organ sites: Secondary | ICD-10-CM

## 2020-03-26 DIAGNOSIS — Z7189 Other specified counseling: Secondary | ICD-10-CM

## 2020-03-26 DIAGNOSIS — Z5112 Encounter for antineoplastic immunotherapy: Secondary | ICD-10-CM | POA: Diagnosis not present

## 2020-03-26 MED ORDER — PROCHLORPERAZINE MALEATE 10 MG PO TABS
10.0000 mg | ORAL_TABLET | Freq: Once | ORAL | Status: AC
  Administered 2020-03-26: 10 mg via ORAL

## 2020-03-26 MED ORDER — SODIUM CHLORIDE 0.9 % IV SOLN
Freq: Once | INTRAVENOUS | Status: AC
  Filled 2020-03-26: qty 250

## 2020-03-26 MED ORDER — SODIUM CHLORIDE 0.9 % IV SOLN
75.0000 mg/m2 | Freq: Once | INTRAVENOUS | Status: AC
  Administered 2020-03-26: 160 mg via INTRAVENOUS
  Filled 2020-03-26: qty 8

## 2020-03-26 NOTE — Progress Notes (Signed)
    DATE:  03/25/2020                                          X  CHEMO/IMMUNOTHERAPY REACTION            MD:  Dr. Sullivan Lone   AGENT/BLOOD [place]:              [facility]   AGENT/BLOOD PRODUCT RECEIVING IMMEDIATELY PRIOR TO REACTION:           Ruxience at 300 mg/h   VS: BP:      159/86   P:        75       SPO2:        94% on room air  T: 97.6                  REACTION(S):            Itching and erythema of the anterior right lower extremity.   PREMEDS:      Aloxi, Tylenol 650 mg p.o. x1, Benadryl 50 mg p.o. x1 and dexamethasone   INTERVENTION: Ruxience was paused and the patient was given Pepcid 20 mg IV x1.  The patient did not show benefit from this and was then given Solu-Medrol 60 mg IV x1.  After 10 to 15 minutes Ruxience was restarted at 250 mg/h.  The patient was able to complete the remainder of his treatment without additional issues of concern.   Review of Systems  Review of Systems  Constitutional: Negative for chills, diaphoresis and fever.  HENT: Negative for trouble swallowing and voice change.   Respiratory: Negative for cough, chest tightness, shortness of breath and wheezing.   Cardiovascular: Negative for chest pain and palpitations.  Gastrointestinal: Negative for abdominal pain, constipation, diarrhea, nausea and vomiting.  Musculoskeletal: Negative for back pain and myalgias.  Skin: Positive for color change and rash.       Pruritis   Neurological: Negative for dizziness, light-headedness and headaches.     Physical Exam  Physical Exam Constitutional:      General: He is not in acute distress.    Appearance: He is not diaphoretic.  HENT:     Head: Normocephalic and atraumatic.  Cardiovascular:     Rate and Rhythm: Normal rate and regular rhythm.     Heart sounds: Normal heart sounds. No murmur heard.  No friction rub. No gallop.   Pulmonary:     Effort: Pulmonary effort is normal. No respiratory distress.     Breath sounds: Normal  breath sounds. No wheezing or rales.  Skin:    General: Skin is warm and dry.     Findings: Erythema and rash present.     Comments: Diffuse erythematous macular rash of the right anterior lower extremity  Neurological:     Mental Status: He is alert.     OUTCOME:                 The patient's symptoms subsided and he was restarted at 250 mg/h of Ruxience.  He was able to complete the remainder of his infusion without any additional issues of concern.   Sandi Mealy, MHS, PA-C

## 2020-03-26 NOTE — Patient Instructions (Signed)
[facility] Discharge Instructions for Patients Receiving Chemotherapy  Today you received the following chemotherapy agents:  Etoposide.  To help prevent nausea and vomiting after your treatment, we encourage you to take your nausea medication as directed.   If you develop nausea and vomiting that is not controlled by your nausea medication, call the clinic.   BELOW ARE SYMPTOMS THAT SHOULD BE REPORTED IMMEDIATELY:  *FEVER GREATER THAN 100.5 F  *CHILLS WITH OR WITHOUT FEVER  NAUSEA AND VOMITING THAT IS NOT CONTROLLED WITH YOUR NAUSEA MEDICATION  *UNUSUAL SHORTNESS OF BREATH  *UNUSUAL BRUISING OR BLEEDING  TENDERNESS IN MOUTH AND THROAT WITH OR WITHOUT PRESENCE OF ULCERS  *URINARY PROBLEMS  *BOWEL PROBLEMS  UNUSUAL RASH Items with * indicate a potential emergency and should be followed up as soon as possible.  Feel free to call the clinic should you have any questions or concerns. The clinic phone number is [phone].  Please show the CHEMO ALERT CARD at check-in to the Emergency Department and triage nurse.   

## 2020-03-26 NOTE — Telephone Encounter (Signed)
Error

## 2020-03-27 ENCOUNTER — Inpatient Hospital Stay: Payer: PPO

## 2020-03-27 ENCOUNTER — Other Ambulatory Visit: Payer: Self-pay

## 2020-03-27 VITALS — BP 130/65 | HR 79 | Temp 97.9°F | Resp 18

## 2020-03-27 DIAGNOSIS — C8339 Diffuse large B-cell lymphoma, extranodal and solid organ sites: Secondary | ICD-10-CM

## 2020-03-27 DIAGNOSIS — Z5112 Encounter for antineoplastic immunotherapy: Secondary | ICD-10-CM | POA: Diagnosis not present

## 2020-03-27 DIAGNOSIS — Z7189 Other specified counseling: Secondary | ICD-10-CM

## 2020-03-27 DIAGNOSIS — C6210 Malignant neoplasm of unspecified descended testis: Secondary | ICD-10-CM

## 2020-03-27 MED ORDER — PROCHLORPERAZINE MALEATE 10 MG PO TABS
10.0000 mg | ORAL_TABLET | Freq: Once | ORAL | Status: AC
  Administered 2020-03-27: 10 mg via ORAL

## 2020-03-27 MED ORDER — SODIUM CHLORIDE 0.9 % IV SOLN
Freq: Once | INTRAVENOUS | Status: AC
  Filled 2020-03-27: qty 250

## 2020-03-27 MED ORDER — PROCHLORPERAZINE MALEATE 10 MG PO TABS
ORAL_TABLET | ORAL | Status: AC
  Filled 2020-03-27: qty 1

## 2020-03-27 MED ORDER — SODIUM CHLORIDE 0.9 % IV SOLN
75.0000 mg/m2 | Freq: Once | INTRAVENOUS | Status: AC
  Administered 2020-03-27: 160 mg via INTRAVENOUS
  Filled 2020-03-27: qty 8

## 2020-03-27 NOTE — Patient Instructions (Signed)
[facility] Discharge Instructions for Patients Receiving Chemotherapy  Today you received the following chemotherapy agents:  Etoposide.  To help prevent nausea and vomiting after your treatment, we encourage you to take your nausea medication as directed.   If you develop nausea and vomiting that is not controlled by your nausea medication, call the clinic.   BELOW ARE SYMPTOMS THAT SHOULD BE REPORTED IMMEDIATELY:  *FEVER GREATER THAN 100.5 F  *CHILLS WITH OR WITHOUT FEVER  NAUSEA AND VOMITING THAT IS NOT CONTROLLED WITH YOUR NAUSEA MEDICATION  *UNUSUAL SHORTNESS OF BREATH  *UNUSUAL BRUISING OR BLEEDING  TENDERNESS IN MOUTH AND THROAT WITH OR WITHOUT PRESENCE OF ULCERS  *URINARY PROBLEMS  *BOWEL PROBLEMS  UNUSUAL RASH Items with * indicate a potential emergency and should be followed up as soon as possible.  Feel free to call the clinic should you have any questions or concerns. The clinic phone number is [phone].  Please show the CHEMO ALERT CARD at check-in to the Emergency Department and triage nurse.   

## 2020-03-28 ENCOUNTER — Ambulatory Visit: Payer: PPO

## 2020-03-30 ENCOUNTER — Other Ambulatory Visit: Payer: Self-pay | Admitting: Student

## 2020-03-30 ENCOUNTER — Other Ambulatory Visit: Payer: Self-pay

## 2020-03-30 ENCOUNTER — Inpatient Hospital Stay: Payer: PPO

## 2020-03-30 VITALS — BP 132/72 | HR 78

## 2020-03-30 DIAGNOSIS — C6210 Malignant neoplasm of unspecified descended testis: Secondary | ICD-10-CM

## 2020-03-30 DIAGNOSIS — Z7189 Other specified counseling: Secondary | ICD-10-CM

## 2020-03-30 DIAGNOSIS — C8339 Diffuse large B-cell lymphoma, extranodal and solid organ sites: Secondary | ICD-10-CM

## 2020-03-30 DIAGNOSIS — Z5112 Encounter for antineoplastic immunotherapy: Secondary | ICD-10-CM | POA: Diagnosis not present

## 2020-03-30 MED ORDER — PEGFILGRASTIM-CBQV 6 MG/0.6ML ~~LOC~~ SOSY
PREFILLED_SYRINGE | SUBCUTANEOUS | Status: AC
  Filled 2020-03-30: qty 0.6

## 2020-03-30 MED ORDER — PEGFILGRASTIM-CBQV 6 MG/0.6ML ~~LOC~~ SOSY
6.0000 mg | PREFILLED_SYRINGE | Freq: Once | SUBCUTANEOUS | Status: AC
  Administered 2020-03-30: 6 mg via SUBCUTANEOUS

## 2020-03-30 NOTE — Patient Instructions (Signed)

## 2020-03-31 ENCOUNTER — Ambulatory Visit (HOSPITAL_COMMUNITY)
Admission: RE | Admit: 2020-03-31 | Discharge: 2020-03-31 | Disposition: A | Discharge status: Home / Self Care | Payer: PPO | Source: Ambulatory Visit | Attending: Hematology | Admitting: Hematology

## 2020-03-31 ENCOUNTER — Other Ambulatory Visit: Payer: Self-pay | Admitting: *Deleted

## 2020-03-31 ENCOUNTER — Other Ambulatory Visit: Payer: Self-pay

## 2020-03-31 DIAGNOSIS — I6522 Occlusion and stenosis of left carotid artery: Secondary | ICD-10-CM | POA: Insufficient documentation

## 2020-03-31 DIAGNOSIS — Z79899 Other long term (current) drug therapy: Secondary | ICD-10-CM | POA: Diagnosis not present

## 2020-03-31 DIAGNOSIS — Z91040 Latex allergy status: Secondary | ICD-10-CM | POA: Insufficient documentation

## 2020-03-31 DIAGNOSIS — C6290 Malignant neoplasm of unspecified testis, unspecified whether descended or undescended: Secondary | ICD-10-CM | POA: Diagnosis not present

## 2020-03-31 DIAGNOSIS — Z5111 Encounter for antineoplastic chemotherapy: Secondary | ICD-10-CM | POA: Diagnosis not present

## 2020-03-31 DIAGNOSIS — D472 Monoclonal gammopathy: Secondary | ICD-10-CM | POA: Diagnosis not present

## 2020-03-31 DIAGNOSIS — I10 Essential (primary) hypertension: Secondary | ICD-10-CM | POA: Diagnosis not present

## 2020-03-31 DIAGNOSIS — Z8669 Personal history of other diseases of the nervous system and sense organs: Secondary | ICD-10-CM | POA: Insufficient documentation

## 2020-03-31 DIAGNOSIS — C6291 Malignant neoplasm of right testis, unspecified whether descended or undescended: Secondary | ICD-10-CM | POA: Diagnosis not present

## 2020-03-31 DIAGNOSIS — C8339 Diffuse large B-cell lymphoma, extranodal and solid organ sites: Secondary | ICD-10-CM | POA: Diagnosis not present

## 2020-03-31 DIAGNOSIS — E785 Hyperlipidemia, unspecified: Secondary | ICD-10-CM | POA: Insufficient documentation

## 2020-03-31 DIAGNOSIS — Z96651 Presence of right artificial knee joint: Secondary | ICD-10-CM | POA: Insufficient documentation

## 2020-03-31 DIAGNOSIS — Z9079 Acquired absence of other genital organ(s): Secondary | ICD-10-CM | POA: Diagnosis not present

## 2020-03-31 DIAGNOSIS — C83398 Diffuse large b-cell lymphoma of other extranodal and solid organ sites: Secondary | ICD-10-CM

## 2020-03-31 DIAGNOSIS — Z85820 Personal history of malignant melanoma of skin: Secondary | ICD-10-CM | POA: Diagnosis not present

## 2020-03-31 DIAGNOSIS — Z452 Encounter for adjustment and management of vascular access device: Secondary | ICD-10-CM | POA: Diagnosis not present

## 2020-03-31 DIAGNOSIS — Z8249 Family history of ischemic heart disease and other diseases of the circulatory system: Secondary | ICD-10-CM | POA: Insufficient documentation

## 2020-03-31 DIAGNOSIS — C6210 Malignant neoplasm of unspecified descended testis: Secondary | ICD-10-CM

## 2020-03-31 HISTORY — PX: IR IMAGING GUIDED PORT INSERTION: IMG5740

## 2020-03-31 MED ORDER — CEFAZOLIN SODIUM-DEXTROSE 2-4 GM/100ML-% IV SOLN
INTRAVENOUS | Status: AC
  Administered 2020-03-31: 2000 mg
  Filled 2020-03-31: qty 100

## 2020-03-31 MED ORDER — LIDOCAINE-EPINEPHRINE 1 %-1:100000 IJ SOLN
INTRAMUSCULAR | Status: AC | PRN
  Administered 2020-03-31: 20 mL

## 2020-03-31 MED ORDER — MIDAZOLAM HCL 2 MG/2ML IJ SOLN
INTRAMUSCULAR | Status: AC
  Filled 2020-03-31: qty 2

## 2020-03-31 MED ORDER — HEPARIN SOD (PORK) LOCK FLUSH 100 UNIT/ML IV SOLN
INTRAVENOUS | Status: AC
  Filled 2020-03-31: qty 5

## 2020-03-31 MED ORDER — LIDOCAINE-EPINEPHRINE 1 %-1:100000 IJ SOLN
INTRAMUSCULAR | Status: AC
  Filled 2020-03-31: qty 1

## 2020-03-31 MED ORDER — FENTANYL CITRATE (PF) 100 MCG/2ML IJ SOLN
INTRAMUSCULAR | Status: AC
  Filled 2020-03-31: qty 2

## 2020-03-31 MED ORDER — HEPARIN SOD (PORK) LOCK FLUSH 100 UNIT/ML IV SOLN
INTRAVENOUS | Status: AC | PRN
  Administered 2020-03-31: 500 [IU] via INTRAVENOUS

## 2020-03-31 MED ORDER — FENTANYL CITRATE (PF) 100 MCG/2ML IJ SOLN
INTRAMUSCULAR | Status: AC | PRN
  Administered 2020-03-31: 50 ug via INTRAVENOUS
  Administered 2020-03-31 (×2): 25 ug via INTRAVENOUS

## 2020-03-31 MED ORDER — SODIUM CHLORIDE 0.9 % IV SOLN: INTRAVENOUS | Status: DC

## 2020-03-31 MED ORDER — MIDAZOLAM HCL 2 MG/2ML IJ SOLN
INTRAMUSCULAR | Status: AC | PRN
  Administered 2020-03-31: 1 mg via INTRAVENOUS
  Administered 2020-03-31 (×2): 0.5 mg via INTRAVENOUS

## 2020-03-31 NOTE — Discharge Instructions (Signed)

## 2020-03-31 NOTE — Procedures (Signed)
  Procedure: R IJ Port catheter placement   EBL:   minimal Complications:  none immediate  See full dictation in [facility] PACS.  D. Markeesha Char MD Main # [phone] Pager  [phone]    

## 2020-03-31 NOTE — H&P (Signed)
Chief Complaint: Patient was seen in consultation today for lymphoma/Port-a-cath placement.  Referring Physician(s): Brunetta Genera  Supervising Physician: Arne Cleveland  Patient Status: [facility] - Out-pt  History of Present Illness: Antonio [name] is a 79 y.o. male with a past medical history of hypertension, hyperlipidemia, LBBB, MGUS, B-cell lymphoma, melanoma, and nephrolithiasis. He was unfortunately diagnosed with primary right testicular large B-cell lymphoma in 2000. He underwent a course of systemic chemotherapy for management. In 02/2020, he was unfortunately found to have primary left testicular large B-cell lymphoma. His cancer is currently managed by Dr. Irene Limbo. He has tentative plans to begin systemic chemotherapy as management.  IR requested by Dr. Irene Limbo for possible image-guided Port-a-cath placement. Patient awake and alert sitting in chair with no complaints at this time. Denies fever, chills, chest pain, dyspnea, abdominal pain, or headache.   Past Medical History:  Diagnosis Date  . Age-related macular degeneration, dry, both eyes   . 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 Carotid stenosis, asymptomatic, left ?  . ED (erectile dysfunction)   . Elevated diaphragm    right  . Erectile dysfunction   . History of basal cell carcinoma (BCC) excision    multiple excision in office  . History of kidney stones   . History of seizure    03-03-2020 per pt x1 while taking interferan for melanoma in 2001 ,  none since, told a side effect of interferan  . History of squamous cell carcinoma excision    multiple skin excision in office  . Hyperlipidemia   . Hypertension    followed by pcp  (03-03-2020 per pt had a stress test approx. 2010, told normal)  . LBBB (left bundle  branch block)   . Lymphoma of testis ([place]) 2000   s/p right orchiectomy   . Mass of left testis   . MGUS (monoclonal gammopathy of unknown significance)    oncologist--- dr Irene Limbo  . Personal history of malignant melanoma of skin dermatologist--- dr Wilhemina Bonito   dx 2001 left calf area  s/p WLE ,  completed one year interferan (03-03-2020 pt states did not involve lymph nodes, no recurrence)  . Urgency of urination   . Wears hearing aid in both ears     Past Surgical History:  Procedure Laterality Date  . CATARACT EXTRACTION W/ INTRAOCULAR LENS IMPLANT Bilateral 2018  . MELANOMA EXCISION  10/1999  . ORCHIECTOMY Right 2000  . ORCHIECTOMY Left 03/05/2020   Procedure: ORCHIECTOMY;  Surgeon: [name], MD;  Location: [facility];  Service: Urology;  Laterality: Left;  . TONSILLECTOMY  child  . TOTAL KNEE ARTHROPLASTY Right 06/2009    Allergies: Latex  Medications: Prior to Admission medications   Medication Sig Start Date End Date Taking? Authorizing Provider  calcium carbonate (TUMS - DOSED IN MG ELEMENTAL CALCIUM) 500 MG chewable tablet Chew 1 tablet by mouth as needed for indigestion or heartburn.   Yes [provider]  Cholecalciferol (VITAMIN D3) 2000 units TABS Take 1 tablet by mouth daily.   Yes [provider]  Cyanocobalamin (B-12 PO) Take 1 tablet by mouth daily.   Yes [provider]  Ketotifen Fumarate (REFRESH EYE ITCH RELIEF OP) Place 1 drop into both eyes daily as needed (itchy eyes).    Yes [provider]  losartan (  COZAAR) 100 MG tablet Take 1 tablet (100 mg total) by mouth daily. 02/17/20  Yes Reed, Tiffany L, DO  meloxicam (MOBIC) 7.5 MG tablet TAKE 1 TABLET (7.5 MG TOTAL) BY MOUTH 2 (TWO) TIMES DAILY AS NEEDED FOR PAIN. Patient taking differently: Take 7.5 mg by mouth daily.  02/12/20  Yes Reed, Tiffany L, DO  Multiple Vitamins-Minerals (PRESERVISION AREDS 2 PO) Take 1 capsule by mouth 2 (two) times daily.    Yes  [provider]  predniSONE (DELTASONE) 20 MG tablet Take 3 tablets (60 mg total) by mouth daily with breakfast. For 5 days with each cycle of chemotherapy 03/25/20  Yes Brunetta Genera, MD  simvastatin (ZOCOR) 40 MG tablet TAKE 1 TABLET BY MOUTH EVERY DAY Patient taking differently: Take 40 mg by mouth daily at 6 PM.  12/25/19  Yes Reed, Tiffany L, DO  valACYclovir (VALTREX) 1000 MG tablet Take one tablet by mouth twice daily for 3 days as needed for flares. 02/18/20  Yes Reed, Tiffany L, DO  ondansetron (ZOFRAN) 8 MG tablet Take 1 tablet (8 mg total) by mouth every 8 (eight) hours as needed for nausea or vomiting. Patient not taking: Reported on 03/30/2020 03/25/20   Brunetta Genera, MD  prochlorperazine (COMPAZINE) 10 MG tablet Take 1 tablet (10 mg total) by mouth every 6 (six) hours as needed for nausea or vomiting. Patient not taking: Reported on 03/30/2020 03/25/20   Brunetta Genera, MD     Family History  Problem Relation Age of Onset  . Cataracts Mother   . Transient ischemic attack Mother   . Asthma Mother   . Cataracts Father   . Diabetes Father        borderline  . Hypertension Father   . Hyperlipidemia Father   . Hyperlipidemia Son   . Hypertension Son   . Amblyopia Neg Hx   . Blindness Neg Hx   . Glaucoma Neg Hx   . Macular degeneration Neg Hx   . Retinal detachment Neg Hx   . Strabismus Neg Hx   . Retinitis pigmentosa Neg Hx     Social History   Socioeconomic History  . Marital status: Married    Spouse name: Not on file  . Number of children: Not on file  . Years of education: Not on file  . Highest education level: Not on file  Occupational History  . Not on file  Tobacco Use  . Smoking status: Never Smoker  . Smokeless tobacco: Never Used  Vaping Use  . Vaping Use: Never used  Substance and Sexual Activity  . Alcohol use: Yes    Alcohol/week: 14.0 standard drinks    Types: 14 Glasses of wine per week    Comment: 2 wine daily  . Drug  use: Never  . Sexual activity: Not on file  Other Topics Concern  . Not on file  Social History Narrative   Social History      Diet?good      Do you drink/eat things with caffeine? seldom      Marital status?          yes                          What year were you married? 1962      Do you live in a house, apartment, assisted living, condo, trailer, etc.? house      Is it one or more stories?1  How many persons live in your home? 2      Do you have any pets in your home? (please list) no      Highest level of education completed? college      Current or past profession: president and [employer]      Do you exercise?   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 yes      Do you have a DNR form?       yes                            If not, do you want to discuss one?      Do you have signed POA/HPOA for forms?  yes      Functional Status      Do you have difficulty bathing or dressing yourself? no      Do you have difficulty preparing food or eating? no      Do you have difficulty managing your medications? no      Do you have difficulty managing your finances? no      Do you have difficulty affording your medications? no   Social Determinants of Health   Financial Resource Strain:   . Difficulty of Paying Living Expenses:   Food Insecurity:   . Worried About Charity fundraiser in the Last Year:   . Arboriculturist in the Last Year:   Transportation Needs:   . Film/video editor (Medical):   Marland Kitchen Lack of Transportation (Non-Medical):   Physical Activity:   . Days of Exercise per Week:   . Minutes of Exercise per Session:   Stress:   . Feeling of Stress :   Social Connections:   . Frequency of Communication with Friends and Family:   . Frequency of Social Gatherings with Friends and Family:   . Attends Religious Services:   . Active Member of Clubs or  Organizations:   . Attends Archivist Meetings:   Marland Kitchen Marital Status:      Review of Systems: A 12 point ROS discussed and pertinent positives are indicated in the HPI above.  All other systems are negative.  Review of Systems  Constitutional: Negative for chills and fever.  Respiratory: Negative for shortness of breath and wheezing.   Cardiovascular: Negative for chest pain and palpitations.  Gastrointestinal: Negative for abdominal pain.  Neurological: Negative for headaches.  Psychiatric/Behavioral: Negative for behavioral problems and confusion.    Vital Signs: BP 120/71   Pulse 77   Temp (!) 97.5 F (36.4 C) (Skin)   Resp 14   Ht 6' (1.829 m)   Wt 190 lb (86.2 kg)   SpO2 97%   BMI 25.77 kg/m   Physical Exam Vitals and nursing note reviewed.  Constitutional:      General: He is not in acute distress.    Appearance: Normal appearance.  Cardiovascular:     Rate and Rhythm: Normal rate and regular rhythm.     Heart sounds: Normal heart sounds. No murmur heard.   Pulmonary:     Effort: Pulmonary effort is normal.  No respiratory distress.     Breath sounds: Normal breath sounds. No wheezing.  Skin:    General: Skin is warm and dry.  Neurological:     Mental Status: He is alert and oriented to person, place, and time.      MD Evaluation Airway: WNL Heart: WNL Abdomen: WNL Chest/ Lungs: WNL ASA  Classification: 3 Mallampati/Airway Score: One   Imaging: NM PET Image Initial (PI) Skull Base To Thigh  Result Date: 03/02/2020 CLINICAL DATA:  Initial treatment strategy for left testicular mass. History of primary large B-cell lymphoma of the right testicle. Patient now has a left testicular mass. EXAM: NUCLEAR MEDICINE PET SKULL BASE TO THIGH TECHNIQUE: 9.55 mCi F-18 FDG was injected intravenously. Full-ring PET imaging was performed from the skull base to thigh after the radiotracer. CT data was obtained and used for attenuation correction and anatomic  localization. Fasting blood glucose: 94 mg/dl COMPARISON:  Testicular ultrasound 02/26/2020. FINDINGS: Mediastinal blood pool activity: SUV max 2.21 Liver activity: SUV max 2.74 NECK: No hypermetabolic lymph nodes in the neck. Incidental CT findings: none CHEST: No supraclavicular or axillary lymphadenopathy. No enlarged or hypermetabolic mediastinal or hilar lymph nodes. No worrisome pulmonary lesions. Incidental CT findings: Marked eventration of the right hemidiaphragm with overlying vascular crowding and atelectasis. Scattered aortic and coronary artery calcifications. ABDOMEN/PELVIS: Large markedly hypermetabolic left testicular mass measuring 5.2 x 4.3 cm. SUV max is 35.55. Suspect involvement of the epididymal head. There is also evidence of involvement of the spermatic cord with a separate mass or possible lymph node in the inguinal canal measuring 2.4 cm and SUV max is 29.96. No enlarged or hypermetabolic inguinal lymph nodes. No abdominal/pelvic lymphadenopathy. The liver and spleen are unremarkable. No adrenal gland lesions. Incidental CT findings: Scattered aortic and branch vessel calcifications but no aneurysm. SKELETON: No focal hypermetabolic activity to suggest skeletal metastasis. Incidental CT findings: none IMPRESSION: 1. Markedly hypermetabolic left testicular mass with probable adjacent involvement of the epididymis and spermatic cord as detailed above. 2. No hypermetabolic lymphadenopathy in the neck, chest, abdomen or pelvis. Electronically Signed   By: Marijo Sanes M.D.   On: 03/02/2020 12:26    Labs:  CBC: Recent Labs    06/04/19 1100 12/19/19 1057 02/26/20 0901 03/25/20 0811  WBC 6.2 6.9 5.4 5.9  HGB 12.8* 13.9 14.2 13.8  HCT 39.2 42.8  40.8 43.7 42.2  PLT 200 204 207 194    COAGS: No results for input(s): INR, APTT in the last 8760 hours.  BMP: Recent Labs    06/04/19 1100 12/19/19 1057 02/26/20 0901 03/25/20 0811  NA 142 143 143 144  K 4.7 4.4 4.7 4.3  CL  108 106 105 108  CO2 24 27 26 23   GLUCOSE 85 79 107* 125*  BUN 24* 18 15 17   CALCIUM 9.1 8.9 9.5 10.0  CREATININE 0.80 0.80 0.82 0.92  GFRNONAA >60 >60 >60 >60  GFRAA >60 >60 >60 >60    LIVER FUNCTION TESTS: Recent Labs    06/04/19 1100 12/19/19 1057 02/26/20 0901 03/25/20 0811  BILITOT 0.6 0.6 0.7 0.6  AST 23 24 23 26   ALT 31 29 27  33  ALKPHOS 63 66 65 71  PROT 6.6 7.1 7.4 7.0  ALBUMIN 3.9 4.2 4.3 3.9     Assessment and Plan:  Large B cell lymphoma with tentative plans to begin systemic chemotherapy as management. Plan for image-guided Port-a-cath placement today in IR. Patient is NPO. Afebrile.  Risks and benefits of image-guided Port-a-catheter  placement were discussed with the patient including, but not limited to bleeding, infection, pneumothorax, or fibrin sheath development and need for additional procedures. All of the patient's questions were answered, patient is agreeable to proceed. Consent signed and in chart.   Thank you for this interesting consult.  I greatly enjoyed meeting KIE CALVIN and look forward to participating in their care.  A copy of this report was sent to the requesting provider on this date.  Electronically Signed: Earley Abide, PA-C 03/31/2020, 1:58 PM   I spent a total of 25 Minutes in face to face in clinical consultation, greater than 50% of which was counseling/coordinating care for lymphoma/Port-a-cath placement.

## 2020-04-01 ENCOUNTER — Inpatient Hospital Stay: Payer: PPO

## 2020-04-01 ENCOUNTER — Inpatient Hospital Stay: Payer: PPO | Admitting: Hematology

## 2020-04-01 ENCOUNTER — Other Ambulatory Visit: Payer: Self-pay

## 2020-04-01 VITALS — BP 118/69 | HR 69 | Temp 97.5°F | Resp 18 | Ht 72.0 in | Wt 191.5 lb

## 2020-04-01 DIAGNOSIS — C8339 Diffuse large B-cell lymphoma, extranodal and solid organ sites: Secondary | ICD-10-CM

## 2020-04-01 DIAGNOSIS — C6210 Malignant neoplasm of unspecified descended testis: Secondary | ICD-10-CM

## 2020-04-01 DIAGNOSIS — C83398 Diffuse large b-cell lymphoma of other extranodal and solid organ sites: Secondary | ICD-10-CM

## 2020-04-01 DIAGNOSIS — Z5112 Encounter for antineoplastic immunotherapy: Secondary | ICD-10-CM | POA: Diagnosis not present

## 2020-04-01 DIAGNOSIS — Z95828 Presence of other vascular implants and grafts: Secondary | ICD-10-CM

## 2020-04-01 LAB — CBC WITH DIFFERENTIAL (CANCER CENTER ONLY)
Abs Immature Granulocytes: 0.4 10*3/uL — ABNORMAL HIGH (ref 0.00–0.07)
Basophils Absolute: 0.1 10*3/uL (ref 0.0–0.1)
Basophils Relative: 1 %
Eosinophils Absolute: 0.1 10*3/uL (ref 0.0–0.5)
Eosinophils Relative: 1 %
HCT: 36.6 % — ABNORMAL LOW (ref 39.0–52.0)
Hemoglobin: 11.9 g/dL — ABNORMAL LOW (ref 13.0–17.0)
Lymphocytes Relative: 12 %
Lymphs Abs: 1.1 10*3/uL (ref 0.7–4.0)
MCH: 32.8 pg (ref 26.0–34.0)
MCHC: 32.5 g/dL (ref 30.0–36.0)
MCV: 100.8 fL — ABNORMAL HIGH (ref 80.0–100.0)
Metamyelocytes Relative: 3 %
Monocytes Absolute: 0.2 10*3/uL (ref 0.1–1.0)
Monocytes Relative: 2 %
Myelocytes: 1 %
Neutro Abs: 7.6 10*3/uL (ref 1.7–7.7)
Neutrophils Relative %: 80 %
Platelet Count: 159 10*3/uL (ref 150–400)
RBC: 3.63 MIL/uL — ABNORMAL LOW (ref 4.22–5.81)
RDW: 12.4 % (ref 11.5–15.5)
WBC Count: 9.5 10*3/uL (ref 4.0–10.5)
nRBC: 0 % (ref 0.0–0.2)

## 2020-04-01 LAB — CMP (CANCER CENTER ONLY)
ALT: 24 U/L (ref 0–44)
AST: 12 U/L — ABNORMAL LOW (ref 15–41)
Albumin: 3.7 g/dL (ref 3.5–5.0)
Alkaline Phosphatase: 63 U/L (ref 38–126)
Anion gap: 7 (ref 5–15)
BUN: 18 mg/dL (ref 8–23)
CO2: 29 mmol/L (ref 22–32)
Calcium: 8.8 mg/dL — ABNORMAL LOW (ref 8.9–10.3)
Chloride: 103 mmol/L (ref 98–111)
Creatinine: 0.76 mg/dL (ref 0.61–1.24)
GFR, Est AFR Am: >60 mL/min (ref >60)
GFR, Estimated: >60 mL/min (ref >60)
Glucose, Bld: 81 mg/dL (ref 70–99)
Potassium: 4.4 mmol/L (ref 3.5–5.1)
Sodium: 139 mmol/L (ref 135–145)
Total Bilirubin: 0.9 mg/dL (ref 0.3–1.2)
Total Protein: 6.5 g/dL (ref 6.5–8.1)

## 2020-04-01 LAB — MAGNESIUM: Magnesium: 2.1 mg/dL (ref 1.7–2.4)

## 2020-04-01 MED ORDER — HEPARIN SOD (PORK) LOCK FLUSH 100 UNIT/ML IV SOLN
500.0000 [IU] | Freq: Once | INTRAVENOUS | Status: AC
  Administered 2020-04-01: 500 [IU] via INTRAVENOUS
  Filled 2020-04-01: qty 5

## 2020-04-01 MED ORDER — SODIUM CHLORIDE 0.9% FLUSH
10.0000 mL | INTRAVENOUS | Status: DC | PRN
  Administered 2020-04-01: 10 mL via INTRAVENOUS
  Filled 2020-04-01: qty 10

## 2020-04-01 NOTE — Progress Notes (Signed)
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 male who has been referred to [place] by Dr. Hollace [name] for evaluation and management of Bone lesion of left lower leg. The pt reports that he is doing well [name].   The pt reports that he cannot feel the ovoid finding from his 04/09/18 MRI as noted below. He denies being able to feel anything in his skin as well. He notes that he received a needle injection in his knee prior to this MRI. The pt notes that his knee has lost much cartilage and is considering a knee replacement. He denies any recent injuries to his knee or falls. He has contacted Dr. Alphonsa [name] in surgery and another surgeon as well and will be making an appointment.   The pt notes that he does not feel any differently recently as compared to 6 months to a year ago. He does note that he normally has constipation in the spring, and has continued to have mild constipation. He denies any other concerns or symptoms.   The pt previously saw my colleague Dr. Lurline [name] for primary testicular B-cell lymphoma in 2000. The pt notes that the involvement was limited to only one testicle. He received 6 intrathecal treatments in his spine, and received 4 cycles of chemotherapy (thinks this was CHOP) and radiation as well. The pt was followed by an oncologist for several years at the [facility] in [place] after moving from [place] after his [place] treatment.   The pt notes that he was first diagnosed with melanoma in October 2000. He noticed a strange spot on his left leg that was surgically resected. The pt's personal notes report a 2.9cm thick, level 4, no LN involvement, high risk stage  II, treated with Interferon three times a week for one year. He notes some squamous cell and basal cell involvements as well and sees a dermatologist, Antonio [name], regularly.   Of note prior to the patient's visit today, pt has had MRI Left Knee completed on 04/09/18 with results revealing An ovoid, lobulated, 65m T2 hyperintense focus within the medial subcutaneous far 1.963mdistal to the joint demonstrates demonstrates nonspecific imaging characteristics.   Most recent lab results (03/01/18) of CBC w/diff is as follows: all values are WNL except for RBC at 4.54, MCV at 100.4, MCH at 33.8.   On review of systems, pt reports left knee pain, mild constipation, and denies new or concerning symptoms, noticing any new lumps or bumps, pain along the spine, abdominal pains, problems passing urine, testicular pain/swelling, and any other symptoms.   Interval History:   [name] today for management and evaluation evaluation of new testicular mass. He is here for a toxicity check after C1 R-CEOP. The patient's last visit with [place] on 03/12/2020. The pt reports that he is doing well [name].  The pt reports that he had some constipation directly after treatment. He is treating his constipation with OTC medication. Pt began to break out with hive-like lesions on his legs during his treatment, which resolved with anti-histamines. Pt has nad no issue tolerating testosterone gel but is still having hot flashes occasionally. He had his port placed yesterday and denies  any issues.   Lab results today (04/01/20) of CBC w/diff and CMP is as follows: all values are WNL except for RBC at 3.63, Hgb at 11.9, HCT at 36.6, MCV at 100.8, Abs Immature Granulocytes at 0.40K, Calcium at 8.8, AST at 12. 04/01/2020 Magnesium at 2.1  On review of systems, pt reports constipation, hot flashes and denies N/V/D, pain at port site, abdominal pain and any other symptoms.   MEDICAL HISTORY:  Past Medical History:    Diagnosis Date  . Age-related macular degeneration, dry, both eyes   . B-cell lymphoma 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 Carotid stenosis, asymptomatic, left ?  . ED (erectile dysfunction)   . Elevated diaphragm    right  . Erectile dysfunction   . History of basal cell carcinoma (BCC) excision    multiple excision in office  . History of kidney stones   . History of seizure    03-03-2020 per pt x1 while taking interferan for melanoma in 2001 ,  none since, told a side effect of interferan  . History of squamous cell carcinoma excision    multiple skin excision in office  . Hyperlipidemia   . Hypertension    followed by pcp  (03-03-2020 per pt had a stress test approx. 2010, told normal)  . LBBB (left bundle branch block)   . Lymphoma of testis ([place]) 2000   s/p right orchiectomy   . Mass of left testis   . MGUS (monoclonal gammopathy of unknown significance)    oncologist--- dr Antonio [name]  . Personal history of malignant melanoma of skin dermatologist--- dr Wilhemina [name]   dx 2001 left calf area  s/p WLE ,  completed one year interferan (03-03-2020 pt states did not involve lymph nodes, no recurrence)  . Urgency of urination   . Wears hearing aid in both ears     SURGICAL HISTORY: Past Surgical History:  Procedure Laterality Date  . CATARACT EXTRACTION W/ INTRAOCULAR LENS IMPLANT Bilateral 2018  . IR IMAGING GUIDED PORT INSERTION  03/31/2020  . MELANOMA EXCISION  10/1999  . ORCHIECTOMY Right 2000  . ORCHIECTOMY Left 03/05/2020   Procedure: ORCHIECTOMY;  Surgeon: Antonio Seal, MD;  Location: [facility];  Service: Urology;  Laterality: Left;  . TONSILLECTOMY  child  . TOTAL KNEE ARTHROPLASTY Right 06/2009    SOCIAL HISTORY: Social History   Socioeconomic History  . Marital status:  Married    Spouse name: Not on file  . Number of children: Not on file  . Years of education: Not on file  . Highest education level: Not on file  Occupational History  . Not on file  Tobacco Use  . Smoking status: Never Smoker  . Smokeless tobacco: Never Used  Vaping Use  . Vaping Use: Never used  Substance and Sexual Activity  . Alcohol use: Yes    Alcohol/week: 14.0 standard drinks    Types: 14 Glasses of wine per week    Comment: 2 wine daily  . Drug use: Never  . Sexual activity: Not on file  Other Topics Concern  . Not on file  Social History Narrative   Social History      Diet?good      Do you drink/eat things with caffeine? seldom      Marital status?  yes                          What year were you married? 1962      Do you live in a house, apartment, assisted living, condo, trailer, etc.? house      Is it one or more stories?1      How many persons live in your home? 2      Do you have any pets in your home? (please list) no      Highest level of education completed? college      Current or past profession: president and [employer]      Do you exercise?   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 yes      Do you have a DNR form?       yes                            If not, do you want to discuss one?      Do you have signed POA/HPOA for forms?  yes      Functional Status      Do you have difficulty bathing or dressing yourself? no      Do you have difficulty preparing food or eating? no      Do you have difficulty managing your medications? no      Do you have difficulty managing your finances? no      Do you have difficulty affording your medications? no   Social Determinants of Health   Financial Resource Strain:   . Difficulty of Paying Living Expenses:   Food Insecurity:   . Worried About Charity fundraiser in the Last Year:     . Arboriculturist in the Last Year:   Transportation Needs:   . Film/video editor (Medical):   Marland Kitchen Lack of Transportation (Non-Medical):   Physical Activity:   . Days of Exercise per Week:   . Minutes of Exercise per Session:   Stress:   . Feeling of Stress :   Social Connections:   . Frequency of Communication with Friends and Family:   . Frequency of Social Gatherings with Friends and Family:   . Attends Religious Services:   . Active Member of Clubs or Organizations:   . Attends Archivist Meetings:   Marland Kitchen Marital Status:   Intimate Partner Violence:   . Fear of Current or Ex-Partner:   . Emotionally Abused:   Marland Kitchen Physically Abused:   . Sexually Abused:     FAMILY HISTORY: Family History  Problem Relation Age of Onset  . Cataracts Mother   . Transient ischemic attack Mother   . Asthma Mother   . Cataracts Father   . Diabetes Father        borderline  . Hypertension Father   . Hyperlipidemia Father   . Hyperlipidemia Son   . Hypertension Son   . Amblyopia Neg Hx   . Blindness Neg Hx   . Glaucoma Neg Hx   . Macular degeneration Neg Hx   .  Retinal detachment Neg Hx   . Strabismus Neg Hx   . Retinitis pigmentosa Neg Hx     ALLERGIES:  is allergic to latex.  MEDICATIONS:  Current Outpatient Medications  Medication Sig Dispense Refill  . calcium carbonate (TUMS - DOSED IN MG ELEMENTAL CALCIUM) 500 MG chewable tablet Chew 1 tablet by mouth as needed for indigestion or heartburn.    . Cholecalciferol (VITAMIN D3) 2000 units TABS Take 1 tablet by mouth daily.    . Cyanocobalamin (B-12 PO) Take 1 tablet by mouth daily.    Marland Kitchen Ketotifen Fumarate (REFRESH EYE ITCH RELIEF OP) Place 1 drop into both eyes daily as needed (itchy eyes).     Marland Kitchen losartan (COZAAR) 100 MG tablet Take 1 tablet (100 mg total) by mouth daily. 90 tablet 1  . meloxicam (MOBIC) 7.5 MG tablet TAKE 1 TABLET (7.5 MG TOTAL) BY MOUTH 2 (TWO) TIMES DAILY AS NEEDED FOR PAIN. (Patient taking  differently: Take 7.5 mg by mouth daily. ) 180 tablet 0  . Multiple Vitamins-Minerals (PRESERVISION AREDS 2 PO) Take 1 capsule by mouth 2 (two) times daily.     . ondansetron (ZOFRAN) 8 MG tablet Take 1 tablet (8 mg total) by mouth every 8 (eight) hours as needed for nausea or vomiting. (Patient not taking: Reported on 03/30/2020) 30 tablet 0  . predniSONE (DELTASONE) 20 MG tablet Take 3 tablets (60 mg total) by mouth daily with breakfast. For 5 days with each cycle of chemotherapy 15 tablet 5  . prochlorperazine (COMPAZINE) 10 MG tablet Take 1 tablet (10 mg total) by mouth every 6 (six) hours as needed for nausea or vomiting. (Patient not taking: Reported on 03/30/2020) 30 tablet 0  . simvastatin (ZOCOR) 40 MG tablet TAKE 1 TABLET BY MOUTH EVERY DAY (Patient taking differently: Take 40 mg by mouth daily at 6 PM. ) 90 tablet 1  . valACYclovir (VALTREX) 1000 MG tablet Take one tablet by mouth twice daily for 3 days as needed for flares. 30 tablet 1   No current facility-administered medications for this visit.    REVIEW OF SYSTEMS:   .A 10+ POINT REVIEW OF SYSTEMS WAS OBTAINED including neurology, dermatology, psychiatry, cardiac, respiratory, lymph, extremities, GI, GU, Musculoskeletal, constitutional, breasts, reproductive, HEENT.  All pertinent positives are noted in the HPI.  All others are negative.   PHYSICAL EXAMINATION: ECOG PERFORMANCE STATUS: 0 - Asymptomatic VS reviewed - stable  Vitals:   04/01/20 1153  BP: 118/69  Pulse: 69  Resp: 18  Temp: (!) 97.5 F (36.4 C)  SpO2: 95%   Wt Readings from Last 3 Encounters:  04/01/20 191 lb 8 oz (86.9 kg)  03/31/20 190 lb (86.2 kg)  03/12/20 192 lb 14.4 oz (87.5 kg)   Body mass index is 25.97 kg/m.    GENERAL:alert, in no acute distress and comfortable SKIN: no acute rashes, no significant lesions EYES: conjunctiva are pink and non-injected, sclera anicteric OROPHARYNX: MMM, no exudates, no oropharyngeal erythema or ulceration NECK:  supple, no JVD LYMPH:  no palpable lymphadenopathy in the cervical, axillary or inguinal regions LUNGS: clear to auscultation b/l with normal respiratory effort HEART: regular rate & rhythm ABDOMEN:  normoactive bowel sounds , non tender, not distended. No palpable hepatosplenomegaly.  Extremity: no pedal edema PSYCH: alert & oriented x 3 with fluent speech NEURO: no focal motor/sensory deficits  LABORATORY DATA:  I have reviewed the data as listed  . CBC Latest Ref Rng & Units 04/01/2020 03/25/2020 02/26/2020  WBC 4.0 - 10.5  K/uL 9.5 5.9 5.4  Hemoglobin 13.0 - 17.0 g/dL 11.9(L) 13.8 14.2  Hematocrit 39 - 52 % 36.6(L) 42.2 43.7  Platelets 150 - 400 K/uL 159 194 207   . CBC    Component Value Date/Time   WBC 9.5 04/01/2020 1111   WBC 5.4 02/26/2020 0901   RBC 3.63 (L) 04/01/2020 1111   HGB 11.9 (L) 04/01/2020 1111   HCT 36.6 (L) 04/01/2020 1111   HCT 40.8 12/19/2019 1057   PLT 159 04/01/2020 1111   MCV 100.8 (H) 04/01/2020 1111   MCH 32.8 04/01/2020 1111   MCHC 32.5 04/01/2020 1111   RDW 12.4 04/01/2020 1111   LYMPHSABS 1.1 04/01/2020 1111   MONOABS 0.2 04/01/2020 1111   EOSABS 0.1 04/01/2020 1111   BASOSABS 0.1 04/01/2020 1111    . CMP Latest Ref Rng & Units 04/01/2020 03/25/2020 02/26/2020  Glucose 70 - 99 mg/dL 81 125(H) 107(H)  BUN 8 - 23 mg/dL '18 17 15  ' Creatinine 0.61 - 1.24 mg/dL 0.76 0.92 0.82  Sodium 135 - 145 mmol/L 139 144 143  Potassium 3.5 - 5.1 mmol/L 4.4 4.3 4.7  Chloride 98 - 111 mmol/L 103 108 105  CO2 22 - 32 mmol/L '29 23 26  ' Calcium 8.9 - 10.3 mg/dL 8.8(L) 10.0 9.5  Total Protein 6.5 - 8.1 g/dL 6.5 7.0 7.4  Total Bilirubin 0.3 - 1.2 mg/dL 0.9 0.6 0.7  Alkaline Phos 38 - 126 U/L 63 71 65  AST 15 - 41 U/L 12(L) 26 23  ALT 0 - 44 U/L 24 33 27   03/05/2020 Left Testicular Flow Pathology Report ([id number]):    03/05/2020 FISH Panel:    03/05/2020 Left Testicular Mass Surgical Pathology ([id number]):    Component     Latest Ref Rng &  Units 12/19/2019  Folate, Hemolysate     Not Estab. ng/mL 390.0  HCT     37.5 - 51.0 % 40.8  Folate, RBC     >498 ng/mL 956  Vitamin B12     180 - 914 pg/mL 882   03/01/18 CBC w/diff:   RADIOGRAPHIC STUDIES: I have personally reviewed the radiological images as listed and agreed with the findings in the report. IR IMAGING GUIDED PORT INSERTION  Result Date: 04/01/2020 CLINICAL DATA:  Testicular lymphoma, needs durable venous access for planned chemotherapy regimen EXAM: TUNNELED PORT CATHETER PLACEMENT WITH ULTRASOUND AND FLUOROSCOPIC GUIDANCE FLUOROSCOPY TIME:  6 seconds; 1 mGy ANESTHESIA/SEDATION: Intravenous Fentanyl 146mg and Versed 293mwere administered as conscious sedation during continuous monitoring of the patient's level of consciousness and physiological / cardiorespiratory status by the radiology RN, with a total moderate sedation time of 18 minutes. TECHNIQUE: The procedure, risks, benefits, and alternatives were explained to the patient. Questions regarding the procedure were encouraged and answered. The patient understands and consents to the procedure. As antibiotic prophylaxis, cefazolin 2 g was ordered pre-procedure and administered intravenously within one hour of incision. Patency of the right IJ vein was confirmed with ultrasound with image documentation. An appropriate skin site was determined. Skin site was marked. Region was prepped using maximum barrier technique including cap and mask, sterile gown, sterile gloves, large sterile sheet, and Chlorhexidine as cutaneous antisepsis. The region was infiltrated locally with 1% lidocaine. Under real-time ultrasound guidance, the right IJ vein was accessed with a 21 gauge micropuncture needle; the needle tip within the vein was confirmed with ultrasound image documentation. Needle was exchanged over a 018 guidewire for transitional dilator, and vascular measurement was performed. A small  incision was made on the right anterior chest  wall and a subcutaneous pocket fashioned. The power-injectable port was positioned and its catheter tunneled to the right IJ dermatotomy site. The transitional dilator was exchanged over an Amplatz wire for a peel-away sheath, through which the port catheter, which had been trimmed to the appropriate length, was advanced and positioned under fluoroscopy with its tip at the cavoatrial junction. Spot chest radiograph confirms good catheter position and no pneumothorax. The port was flushed per protocol. The pocket was closed with deep interrupted and subcuticular continuous 3-0 Monocryl sutures. The incisions were covered with Dermabond then covered with a sterile dressing. The patient tolerated the procedure well. COMPLICATIONS: COMPLICATIONS None immediate IMPRESSION: Technically successful right IJ power-injectable port catheter placement. Ready for routine use. Electronically Signed   By: Lucrezia Europe M.D.   On: 04/01/2020 09:44    03/05/20 of Surgical Pathology ([id number]) SURGICAL PATHOLOGY  CASE: [id number]  PATIENT: [name]  Surgical Pathology Report      Clinical History: Left testicular mass (jmc)      FINAL MICROSCOPIC DIAGNOSIS:   A. TESTICLE AND CORD, LEFT, ORCHIECTOMY:  - Diffuse large B-cell lymphoma  -See comment    COMMENT:   The sections of the testis and spermatic cord nodules show effacement of  the architecture by an atypical lymphoid infiltrate characterized by  predominance of medium and large lymphoid cells with vesicular chromatin  and small nucleoli associated with brisk mitosis. The appearance is  primarily diffuse with lack of atypical follicles. Variable number of  admixed smaller lymphocytes are seen. To further evaluate this process,  flow cytometric analysis was performed ([id number]) and shows a  monoclonal kappa-restricted B-cell population. In addition, a battery  of immunohistochemical stains was performed and shows that the atypical   lymphoid cells are positive for CD20, CD79a, PAX 5, CD10 (weak), BCL-2,  MUM-1 and cytoplasmic kappa. No significant staining is seen with  CD138, cyclin D1, CD30, CD34, TdT, EBV in situ hybridization or  cytoplasmic lambda. Ki67 shows variable expression ranging from 10% to  >50%. There is an admixed T-cell population to a lesser extent as seen  with CD3 and CD5 and there is no apparent co-expression of CD5 in B-cell  areas. The [name] features are consistent with involvement by diffuse  large B-cell lymphoma, GCB type. The results were discussed with Dr.  Jeffie Pollock and Antonio [name] on 03/11/2020.    ASSESSMENT & PLAN:   78 y.o. male with  1. Ovoid lesion near the medial aspect of the left knee ? Injection granuloma vs other etiology for lesion. however given previous h/o melanoma and lymphoma will need to be cautious and try to wotrk this up.  04/09/18 MRI of Left Knee revealed An ovoid, lobulated, 84m T2 hyperintense focus within the medial subcutaneous far 1.943mdistal to the joint demonstrates demonstrates nonspecific imaging characteristics.   05/17/18 [place] Soft Tissue Biopsy of the Left Knee did not identify any malignant cells and revealed inflammatory cells.  2. MGUS- IgM kappa monoclonal paraproteinemia 05/02/18 MMP revealed M Protein at 0.5 with immunofixation revealing IgM with kappa light chains  3/ h/o Melanoma   4. H/o Rt Primary testicular large B cell lymphoma in 2000 treatment with R-CHOP x 6 cycles as part of clinical trial + IT MTX x 4 cycles and RT to left testicle  5. 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 Appears to be a new event since it is occurring 20 yrs after  his previous rt testicular lymphoma  PLAN -Discussed pt labwork today, 04/01/20; WBC & PLT are stable, Hgb has dropped slightly, blood chemistries are stable, Magnesium is WNL -The pt has no prohibitive toxicities from continuing C2 R-CEOP in two weeks.  -Advised  pt to avoid alcoholic beverages at least 3 days prior to and after treatments.  -Advised pt that there is no data to support repeating Intrathecal Methotrexate in a relapsed setting.  -Recommend pt continue stool softener/laxative to prevent constipation with treatment. -Continue to f/u with Dr. Jeffie Pollock for Testosterone replacement management   -Will see back in 12 days with labs    FOLLOW UP: Labs and MD visit in 12 days (1-2 days prior to C2 of treatment) Plz schedule next cycle (C2) of R-CEOP as per orders  The total time spent in the appt was 20 minutes and more than 50% was on counseling and direct patient cares.  All of the patient's questions were answered with apparent satisfaction. The patient knows to call the clinic with any problems, questions or concerns.    Sullivan Lone MD [place] AAHIVMS [facility] Hematology/Oncology Physician [name]  (Office):       [phone] (Work cell):  [phone] (Fax):           [phone]  04/01/2020 12:35 PM  I, Yevette Edwards, am acting as a scribe for Dr. Sullivan Lone.   .I have reviewed the above documentation for accuracy and completeness, and I agree with the above. Brunetta Genera MD

## 2020-04-02 ENCOUNTER — Other Ambulatory Visit: Payer: Self-pay | Admitting: Hematology

## 2020-04-02 DIAGNOSIS — C8339 Diffuse large B-cell lymphoma, extranodal and solid organ sites: Secondary | ICD-10-CM

## 2020-04-03 ENCOUNTER — Encounter: Payer: Self-pay | Admitting: Hematology

## 2020-04-07 LAB — SURGICAL PATHOLOGY

## 2020-04-09 NOTE — Progress Notes (Signed)
Pharmacist Chemotherapy Monitoring - Follow Up Assessment    I verify that I have reviewed each item in the below checklist:  . Regimen for the patient is scheduled for the appropriate day and plan matches scheduled date. Marland Kitchen Appropriate non-routine labs are ordered dependent on drug ordered. . If applicable, additional medications reviewed and ordered per protocol based on lifetime cumulative doses and/or treatment regimen.   Plan for follow-up and/or issues identified: Yes . I-vent associated with next due treatment: Yes . MD and/or nursing notified: No  Antonio [name] 04/09/2020 1:21 PM

## 2020-04-13 ENCOUNTER — Inpatient Hospital Stay: Payer: PPO

## 2020-04-13 ENCOUNTER — Inpatient Hospital Stay: Payer: PPO | Admitting: Hematology

## 2020-04-13 ENCOUNTER — Other Ambulatory Visit: Payer: Self-pay

## 2020-04-13 VITALS — BP 137/84 | HR 74 | Temp 97.5°F | Resp 18 | Ht 72.0 in | Wt 192.3 lb

## 2020-04-13 DIAGNOSIS — C8339 Diffuse large B-cell lymphoma, extranodal and solid organ sites: Secondary | ICD-10-CM

## 2020-04-13 DIAGNOSIS — D4959 Neoplasm of unspecified behavior of other genitourinary organ: Secondary | ICD-10-CM

## 2020-04-13 DIAGNOSIS — Z95828 Presence of other vascular implants and grafts: Secondary | ICD-10-CM

## 2020-04-13 DIAGNOSIS — Z5112 Encounter for antineoplastic immunotherapy: Secondary | ICD-10-CM | POA: Diagnosis not present

## 2020-04-13 LAB — CMP (CANCER CENTER ONLY)
ALT: 32 U/L (ref 0–44)
AST: 23 U/L (ref 15–41)
Albumin: 3.7 g/dL (ref 3.5–5.0)
Alkaline Phosphatase: 124 U/L (ref 38–126)
Anion gap: 10 (ref 5–15)
BUN: 18 mg/dL (ref 8–23)
CO2: 25 mmol/L (ref 22–32)
Calcium: 9 mg/dL (ref 8.9–10.3)
Chloride: 109 mmol/L (ref 98–111)
Creatinine: 0.81 mg/dL (ref 0.61–1.24)
GFR, Est AFR Am: >60 mL/min (ref >60)
GFR, Estimated: >60 mL/min (ref >60)
Glucose, Bld: 111 mg/dL — ABNORMAL HIGH (ref 70–99)
Potassium: 4.4 mmol/L (ref 3.5–5.1)
Sodium: 144 mmol/L (ref 135–145)
Total Bilirubin: 0.3 mg/dL (ref 0.3–1.2)
Total Protein: 6.4 g/dL — ABNORMAL LOW (ref 6.5–8.1)

## 2020-04-13 LAB — CBC WITH DIFFERENTIAL/PLATELET
Abs Immature Granulocytes: 1.13 10*3/uL — ABNORMAL HIGH (ref 0.00–0.07)
Basophils Absolute: 0.1 10*3/uL (ref 0.0–0.1)
Basophils Relative: 1 %
Eosinophils Absolute: 0.1 10*3/uL (ref 0.0–0.5)
Eosinophils Relative: 1 %
HCT: 37.6 % — ABNORMAL LOW (ref 39.0–52.0)
Hemoglobin: 12.1 g/dL — ABNORMAL LOW (ref 13.0–17.0)
Immature Granulocytes: 9 %
Lymphocytes Relative: 11 %
Lymphs Abs: 1.4 10*3/uL (ref 0.7–4.0)
MCH: 32.7 pg (ref 26.0–34.0)
MCHC: 32.2 g/dL (ref 30.0–36.0)
MCV: 101.6 fL — ABNORMAL HIGH (ref 80.0–100.0)
Monocytes Absolute: 1.2 10*3/uL — ABNORMAL HIGH (ref 0.1–1.0)
Monocytes Relative: 9 %
Neutro Abs: 9.1 10*3/uL — ABNORMAL HIGH (ref 1.7–7.7)
Neutrophils Relative %: 69 %
Platelets: 178 10*3/uL (ref 150–400)
RBC: 3.7 MIL/uL — ABNORMAL LOW (ref 4.22–5.81)
RDW: 13.7 % (ref 11.5–15.5)
WBC: 12.9 10*3/uL — ABNORMAL HIGH (ref 4.0–10.5)
nRBC: 0 % (ref 0.0–0.2)

## 2020-04-13 LAB — MAGNESIUM: Magnesium: 1.8 mg/dL (ref 1.7–2.4)

## 2020-04-13 LAB — LACTATE DEHYDROGENASE: LDH: 207 U/L — ABNORMAL HIGH (ref 98–192)

## 2020-04-13 MED ORDER — SODIUM CHLORIDE 0.9% FLUSH
10.0000 mL | INTRAVENOUS | Status: DC | PRN
  Administered 2020-04-13: 10 mL via INTRAVENOUS
  Filled 2020-04-13: qty 10

## 2020-04-13 MED ORDER — HEPARIN SOD (PORK) LOCK FLUSH 100 UNIT/ML IV SOLN
500.0000 [IU] | Freq: Once | INTRAVENOUS | Status: AC
  Administered 2020-04-13: 500 [IU] via INTRAVENOUS
  Filled 2020-04-13: qty 5

## 2020-04-13 NOTE — Progress Notes (Signed)
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 male who has been referred to [place] by Dr. Hollace Kinnier for evaluation and management of Bone lesion of left lower leg. The pt reports that he is doing well overall.   The pt reports that he cannot feel the ovoid finding from his 04/09/18 MRI as noted below. He denies being able to feel anything in his skin as well. He notes that he received a needle injection in his knee prior to this MRI. The pt notes that his knee has lost much cartilage and is considering a knee replacement. He denies any recent injuries to his knee or falls. He has contacted Dr. Alphonsa Overall in surgery and another surgeon as well and will be making an appointment.   The pt notes that he does not feel any differently recently as compared to 6 months to a year ago. He does note that he normally has constipation in the spring, and has continued to have mild constipation. He denies any other concerns or symptoms.   The pt previously saw my colleague Dr. Lurline Del for primary testicular B-cell lymphoma in 2000. The pt notes that the involvement was limited to only one testicle. He received 6 intrathecal treatments in his spine, and received 4 cycles of chemotherapy (thinks this was CHOP) and radiation as well. The pt was followed by an oncologist for several years at the [facility] in [place] after moving from [place] after his [place] treatment.   The pt notes that he was first diagnosed with melanoma in October 2000. He noticed a strange spot on his left leg that was surgically resected. The pt's personal notes report a 2.9cm thick, level 4, no LN involvement, high risk stage  II, treated with Interferon three times a week for one year. He notes some squamous cell and basal cell involvements as well and sees a dermatologist, Dr. Wilhemina Bonito, regularly.   Of note prior to the patient's visit today, pt has had MRI Left Knee completed on 04/09/18 with results revealing An ovoid, lobulated, 80m T2 hyperintense focus within the medial subcutaneous far 1.961mdistal to the joint demonstrates demonstrates nonspecific imaging characteristics.   Most recent lab results (03/01/18) of CBC w/diff is as follows: all values are WNL except for RBC at 4.54, MCV at 100.4, MCH at 33.8.   On review of systems, pt reports left knee pain, mild constipation, and denies new or concerning symptoms, noticing any new lumps or bumps, pain along the spine, abdominal pains, problems passing urine, testicular pain/swelling, and any other symptoms.   Interval History:   Antonio CARNeturns today for management and evaluation evaluation of new testicular mass. He is here prior to C2 R-CEOP. The patient's last visit with [place] on 04/01/2020. The pt reports that he is doing well overall.  The pt reports that he has had some increased fatigue, but is still able to do his daily activities. He is taking naps during the day as necessary. Pt has noticed accelerated hair loss.   Lab results today (04/13/20) of CBC w/diff and CMP is as follows: all values are WNL except for WBC at 12.9K, RBC at 3.70, Hgb at 12.1,  HCT at 37.6, MCV at 101.6, Neutro Abs at 9.1K, Mono Abs at 1.2K, Abs Immature Granulocytes at 1.13K, Glucose at 111, Total Protein at 6.4. 04/13/2020 LDH at 207 04/13/2020 Magnesium at 1.8  On review of systems, pt reports fatigue, hair loss, mouth soreness and denies N/V/D, numbness/tingling in hands/feet, fevers, chills and any other symptoms.    MEDICAL HISTORY:  Past Medical History:  Diagnosis Date  . Age-related macular degeneration, dry, both eyes   . 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 Carotid stenosis, asymptomatic, left ?  . ED (erectile dysfunction)   . Elevated diaphragm    right  . Erectile dysfunction   . History of basal cell carcinoma (BCC) excision    multiple excision in office  . History of kidney stones   . History of seizure    03-03-2020 per pt x1 while taking interferan for melanoma in 2001 ,  none since, told a side effect of interferan  . History of squamous cell carcinoma excision    multiple skin excision in office  . Hyperlipidemia   . Hypertension    followed by pcp  (03-03-2020 per pt had a stress test approx. 2010, told normal)  . LBBB (left bundle branch block)   . Lymphoma of testis ([place]) 2000   s/p right orchiectomy   . Mass of left testis   . MGUS (monoclonal gammopathy of unknown significance)    oncologist--- dr Irene Limbo  . Personal history of malignant melanoma of skin dermatologist--- dr Wilhemina Bonito   dx 2001 left calf area  s/p WLE ,  completed one year interferan (03-03-2020 pt states did not involve lymph nodes, no recurrence)  . Urgency of urination   . Wears hearing aid in both ears     SURGICAL HISTORY: Past Surgical History:  Procedure Laterality Date  . CATARACT EXTRACTION W/ INTRAOCULAR LENS IMPLANT Bilateral 2018  . IR IMAGING GUIDED PORT INSERTION  03/31/2020  . MELANOMA EXCISION  10/1999  . ORCHIECTOMY Right 2000  . ORCHIECTOMY Left 03/05/2020   Procedure: ORCHIECTOMY;  Surgeon: [name], MD;  Location: [facility];  Service: Urology;  Laterality: Left;  . TONSILLECTOMY  child  . TOTAL KNEE ARTHROPLASTY Right 06/2009    SOCIAL HISTORY: Social History   Socioeconomic History  . Marital status: Married    Spouse name: Not on file  . Number of children: Not on file  . Years of education: Not on file  .  Highest education level: Not on file  Occupational History  . Not on file  Tobacco Use  . Smoking status: Never Smoker  . Smokeless tobacco: Never Used  Vaping Use  . Vaping Use: Never used  Substance and Sexual Activity  . Alcohol use: Yes    Alcohol/week: 14.0 standard drinks    Types: 14 Glasses of wine per week    Comment: 2 wine daily  . Drug use: Never  . Sexual activity: Not on file  Other Topics Concern  . Not on file  Social History Narrative   Social History      Diet?good      Do you drink/eat things with caffeine? seldom      Marital status?          yes  What year were you married? 1962      Do you live in a house, apartment, assisted living, condo, trailer, etc.? house      Is it one or more stories?1      How many persons live in your home? 2      Do you have any pets in your home? (please list) no      Highest level of education completed? college      Current or past profession: president and [employer]      Do you exercise?   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 yes      Do you have a DNR form?       yes                            If not, do you want to discuss one?      Do you have signed POA/HPOA for forms?  yes      Functional Status      Do you have difficulty bathing or dressing yourself? no      Do you have difficulty preparing food or eating? no      Do you have difficulty managing your medications? no      Do you have difficulty managing your finances? no      Do you have difficulty affording your medications? no   Social Determinants of Health   Financial Resource Strain:   . Difficulty of Paying Living Expenses:   Food Insecurity:   . Worried About Charity fundraiser in the Last Year:   . Arboriculturist in the Last Year:   Transportation Needs:   . Film/video editor (Medical):   Marland Kitchen Lack of  Transportation (Non-Medical):   Physical Activity:   . Days of Exercise per Week:   . Minutes of Exercise per Session:   Stress:   . Feeling of Stress :   Social Connections:   . Frequency of Communication with Friends and Family:   . Frequency of Social Gatherings with Friends and Family:   . Attends Religious Services:   . Active Member of Clubs or Organizations:   . Attends Archivist Meetings:   Marland Kitchen Marital Status:   Intimate Partner Violence:   . Fear of Current or Ex-Partner:   . Emotionally Abused:   Marland Kitchen Physically Abused:   . Sexually Abused:     FAMILY HISTORY: Family History  Problem Relation Age of Onset  . Cataracts Mother   . Transient ischemic attack Mother   . Asthma Mother   . Cataracts Father   . Diabetes Father        borderline  . Hypertension Father   . Hyperlipidemia Father   . Hyperlipidemia Son   . Hypertension Son   . Amblyopia Neg Hx   . Blindness Neg Hx   . Glaucoma Neg Hx   . Macular degeneration Neg Hx   . Retinal detachment Neg Hx   . Strabismus Neg Hx   . Retinitis pigmentosa Neg Hx     ALLERGIES:  is allergic to  latex.  MEDICATIONS:  Current Outpatient Medications  Medication Sig Dispense Refill  . diphenhydramine-acetaminophen (TYLENOL PM) 25-500 MG TABS tablet Take 1 tablet by mouth at bedtime as needed.    . calcium carbonate (TUMS - DOSED IN MG ELEMENTAL CALCIUM) 500 MG chewable tablet Chew 1 tablet by mouth as needed for indigestion or heartburn.    . Cholecalciferol (VITAMIN D3) 2000 units TABS Take 1 tablet by mouth daily.    . Cyanocobalamin (B-12 PO) Take 1 tablet by mouth daily.    Marland Kitchen Ketotifen Fumarate (REFRESH EYE ITCH RELIEF OP) Place 1 drop into both eyes daily as needed (itchy eyes).     Marland Kitchen lidocaine-prilocaine (EMLA) cream     . losartan (COZAAR) 100 MG tablet Take 1 tablet (100 mg total) by mouth daily. 90 tablet 1  . meloxicam (MOBIC) 7.5 MG tablet TAKE 1 TABLET (7.5 MG TOTAL) BY MOUTH 2 (TWO) TIMES DAILY AS  NEEDED FOR PAIN. (Patient taking differently: Take 7.5 mg by mouth daily. ) 180 tablet 0  . Multiple Vitamins-Minerals (PRESERVISION AREDS 2 PO) Take 1 capsule by mouth 2 (two) times daily.     . ondansetron (ZOFRAN) 8 MG tablet Take 1 tablet (8 mg total) by mouth every 8 (eight) hours as needed for nausea or vomiting. (Patient not taking: Reported on 03/30/2020) 30 tablet 0  . predniSONE (DELTASONE) 20 MG tablet Take 3 tablets (60 mg total) by mouth daily with breakfast. For 5 days with each cycle of chemotherapy 15 tablet 5  . prochlorperazine (COMPAZINE) 10 MG tablet Take 1 tablet (10 mg total) by mouth every 6 (six) hours as needed for nausea or vomiting. (Patient not taking: Reported on 03/30/2020) 30 tablet 0  . simvastatin (ZOCOR) 40 MG tablet TAKE 1 TABLET BY MOUTH EVERY DAY (Patient taking differently: Take 40 mg by mouth daily at 6 PM. ) 90 tablet 1  . Testosterone 20.25 MG/ACT (1.62%) GEL SMARTSIG:40.5 Milligram(s) Topical Every Morning    . valACYclovir (VALTREX) 1000 MG tablet Take one tablet by mouth twice daily for 3 days as needed for flares. 30 tablet 1   No current facility-administered medications for this visit.    REVIEW OF SYSTEMS:   A 10+ POINT REVIEW OF SYSTEMS WAS OBTAINED including neurology, dermatology, psychiatry, cardiac, respiratory, lymph, extremities, GI, GU, Musculoskeletal, constitutional, breasts, reproductive, HEENT.  All pertinent positives are noted in the HPI.  All others are negative.   PHYSICAL EXAMINATION: ECOG PERFORMANCE STATUS: 0 - Asymptomatic VS reviewed - stable  Vitals:   04/13/20 0950  BP: 137/84  Pulse: 74  Resp: 18  Temp: (!) 97.5 F (36.4 C)  SpO2: 100%   Wt Readings from Last 3 Encounters:  04/13/20 192 lb 4.8 oz (87.2 kg)  04/01/20 191 lb 8 oz (86.9 kg)  03/31/20 190 lb (86.2 kg)   Body mass index is 26.08 kg/m.    GENERAL:alert, in no acute distress and comfortable SKIN: no acute rashes, no significant lesions EYES:  conjunctiva are pink and non-injected, sclera anicteric OROPHARYNX: MMM, no exudates, no oropharyngeal erythema or ulceration NECK: supple, no JVD LYMPH:  no palpable lymphadenopathy in the cervical, axillary or inguinal regions LUNGS: clear to auscultation b/l with normal respiratory effort HEART: regular rate & rhythm ABDOMEN:  normoactive bowel sounds , non tender, not distended. No palpable hepatosplenomegaly.  Extremity: no pedal edema PSYCH: alert & oriented x 3 with fluent speech NEURO: no focal motor/sensory deficits  LABORATORY DATA:  I have reviewed the data as listed  .  CBC Latest Ref Rng & Units 04/13/2020 04/01/2020 03/25/2020  WBC 4.0 - 10.5 K/uL 12.9(H) 9.5 5.9  Hemoglobin 13.0 - 17.0 g/dL 12.1(L) 11.9(L) 13.8  Hematocrit 39 - 52 % 37.6(L) 36.6(L) 42.2  Platelets 150 - 400 K/uL 178 159 194   . CBC    Component Value Date/Time   WBC 12.9 (H) 04/13/2020 0920   RBC 3.70 (L) 04/13/2020 0920   HGB 12.1 (L) 04/13/2020 0920   HGB 11.9 (L) 04/01/2020 1111   HCT 37.6 (L) 04/13/2020 0920   HCT 40.8 12/19/2019 1057   PLT 178 04/13/2020 0920   PLT 159 04/01/2020 1111   MCV 101.6 (H) 04/13/2020 0920   MCH 32.7 04/13/2020 0920   MCHC 32.2 04/13/2020 0920   RDW 13.7 04/13/2020 0920   LYMPHSABS 1.4 04/13/2020 0920   MONOABS 1.2 (H) 04/13/2020 0920   EOSABS 0.1 04/13/2020 0920   BASOSABS 0.1 04/13/2020 0920    . CMP Latest Ref Rng & Units 04/13/2020 04/01/2020 03/25/2020  Glucose 70 - 99 mg/dL 111(H) 81 125(H)  BUN 8 - 23 mg/dL _0 Creatinine 0.61 - 1.24 mg/dL 0.81 0.76 0.92  Sodium 135 - 145 mmol/L 144 139 144  Potassium 3.5 - 5.1 mmol/L 4.4 4.4 4.3  Chloride 98 - 111 mmol/L 109 103 108  CO2 22 - 32 mmol/L _1 Calcium 8.9 - 10.3 mg/dL 9.0 8.8(L) 10.0  Total Protein 6.5 - 8.1 g/dL 6.4(L) 6.5 7.0  Total Bilirubin 0.3 - 1.2 mg/dL 0.3 0.9 0.6  Alkaline Phos 38 - 126 U/L 124 63 71  AST 15 - 41 U/L 23 12(L) 26  ALT 0 - 44 U/L 32 24 33   03/05/2020 Left  Testicular Flow Pathology Report [id number]):    03/05/2020 FISH Panel:    03/05/2020 Left Testicular Mass Surgical Pathology ([id number]):    Component     Latest Ref Rng & Units 12/19/2019  Folate, Hemolysate     Not Estab. ng/mL 390.0  HCT     37.5 - 51.0 % 40.8  Folate, RBC     >498 ng/mL 956  Vitamin B12     180 - 914 pg/mL 882   03/01/18 CBC w/diff:   RADIOGRAPHIC STUDIES: I have personally reviewed the radiological images as listed and agreed with the findings in the report. IR IMAGING GUIDED PORT INSERTION  Result Date: 04/01/2020 CLINICAL DATA:  Testicular lymphoma, needs durable venous access for planned chemotherapy regimen EXAM: TUNNELED PORT CATHETER PLACEMENT WITH ULTRASOUND AND FLUOROSCOPIC GUIDANCE FLUOROSCOPY TIME:  6 seconds; 1 mGy ANESTHESIA/SEDATION: Intravenous Fentanyl 152mg and Versed 245mwere administered as conscious sedation during continuous monitoring of the patient's level of consciousness and physiological / cardiorespiratory status by the radiology RN, with a total moderate sedation time of 18 minutes. TECHNIQUE: The procedure, risks, benefits, and alternatives were explained to the patient. Questions regarding the procedure were encouraged and answered. The patient understands and consents to the procedure. As antibiotic prophylaxis, cefazolin 2 g was ordered pre-procedure and administered intravenously within one hour of incision. Patency of the right IJ vein was confirmed with ultrasound with image documentation. An appropriate skin site was determined. Skin site was marked. Region was prepped using maximum barrier technique including cap and mask, sterile gown, sterile gloves, large sterile sheet, and Chlorhexidine as cutaneous antisepsis. The region was infiltrated locally with 1% lidocaine. Under real-time ultrasound guidance, the right IJ vein was accessed with a 21 gauge micropuncture needle; the needle tip within the vein  was confirmed with  ultrasound image documentation. Needle was exchanged over a 018 guidewire for transitional dilator, and vascular measurement was performed. A small incision was made on the right anterior chest wall and a subcutaneous pocket fashioned. The power-injectable port was positioned and its catheter tunneled to the right IJ dermatotomy site. The transitional dilator was exchanged over an Amplatz wire for a peel-away sheath, through which the port catheter, which had been trimmed to the appropriate length, was advanced and positioned under fluoroscopy with its tip at the cavoatrial junction. Spot chest radiograph confirms good catheter position and no pneumothorax. The port was flushed per protocol. The pocket was closed with deep interrupted and subcuticular continuous 3-0 Monocryl sutures. The incisions were covered with Dermabond then covered with a sterile dressing. The patient tolerated the procedure well. COMPLICATIONS: COMPLICATIONS None immediate IMPRESSION: Technically successful right IJ power-injectable port catheter placement. Ready for routine use. Electronically Signed   By: Lucrezia Europe M.D.   On: 04/01/2020 09:44    03/05/20 of Surgical Pathology ([id number]) SURGICAL PATHOLOGY  CASE: [id number]  PATIENT: Antonio [name]  Surgical Pathology Report      Clinical History: Left testicular mass (jmc)      FINAL MICROSCOPIC DIAGNOSIS:   A. TESTICLE AND CORD, LEFT, ORCHIECTOMY:  - Diffuse large B-cell lymphoma  -See comment    COMMENT:   The sections of the testis and spermatic cord nodules show effacement of  the architecture by an atypical lymphoid infiltrate characterized by  predominance of medium and large lymphoid cells with vesicular chromatin  and small nucleoli associated with brisk mitosis. The appearance is  primarily diffuse with lack of atypical follicles. Variable number of  admixed smaller lymphocytes are seen. To further evaluate this process,  flow cytometric  analysis was performed ([id number]) and shows a  monoclonal kappa-restricted B-cell population. In addition, a battery  of immunohistochemical stains was performed and shows that the atypical  lymphoid cells are positive for CD20, CD79a, PAX 5, CD10 (weak), BCL-2,  MUM-1 and cytoplasmic kappa. No significant staining is seen with  CD138, cyclin D1, CD30, CD34, TdT, EBV in situ hybridization or  cytoplasmic lambda. Ki67 shows variable expression ranging from 10% to  >50%. There is an admixed T-cell population to a lesser extent as seen  with CD3 and CD5 and there is no apparent co-expression of CD5 in B-cell  areas. The overall features are consistent with involvement by diffuse  large B-cell lymphoma, GCB type. The results were discussed with Dr.  Jeffie Pollock and Irene Limbo on 03/11/2020.    ASSESSMENT & PLAN:   79 y.o. male with  1. Ovoid lesion near the medial aspect of the left knee ? Injection granuloma vs other etiology for lesion. however given previous h/o melanoma and lymphoma will need to be cautious and try to wotrk this up.  04/09/18 MRI of Left Knee revealed An ovoid, lobulated, 68m T2 hyperintense focus within the medial subcutaneous far 1.931mdistal to the joint demonstrates demonstrates nonspecific imaging characteristics.   05/17/18 [place] Soft Tissue Biopsy of the Left Knee did not identify any malignant cells and revealed inflammatory cells.  2. MGUS- IgM kappa monoclonal paraproteinemia 05/02/18 MMP revealed M Protein at 0.5 with immunofixation revealing IgM with kappa light chains  3/ h/o Melanoma   4. H/o Rt Primary testicular large B cell lymphoma in 2000 treatment with R-CHOP x 6 cycles as part of clinical trial + IT MTX x 4 cycles and RT to left testicle  5. Newly  diagnosed Left Primary testicular large B cell lymphoma Stage I/IIE. Appears to be a new event since it is occurring 20 yrs after his previous rt testicular lymphoma  PLAN -Discussed pt labwork today,  04/13/20; blood counts holding steady, blood chemistries look good, Magnesium is WNL, LDH is borderline high  -The pt has no prohibitive toxicities from continuing C2 R-CEOP at this time.  -Recommended that the pt continue to eat well, drink at least 48-64 oz of water each day, and walk 20-30 minutes each day.  -Recommend pt use baking soda/salt mouthwash 3-4 times per day  -Recommend live-cultured yogurt or butter to improve mouth soreness  -Will see back with C3D1   FOLLOW UP: F/u for C2 as scheduled  Plz schedule C3 as ordered (3 days of treatment + Udenyca) Portflush and labs on C3D1 MD visit on C3D1  The total time spent in the appt was 20 minutes and more than 50% was on counseling and direct patient cares.  All of the patient's questions were answered with apparent satisfaction. The patient knows to call the clinic with any problems, questions or concerns.    Sullivan Lone MD [place] AAHIVMS [facility] Hematology/Oncology Physician [facility]  (Office):       [phone] (Work cell):  [phone] (Fax):           [phone]  04/13/2020 10:30 AM  I, Yevette Edwards, am acting as a scribe for Dr. Sullivan Lone.   .I have reviewed the above documentation for accuracy and completeness, and I agree with the above. Brunetta Genera MD

## 2020-04-13 NOTE — Patient Instructions (Signed)

## 2020-04-15 ENCOUNTER — Other Ambulatory Visit: Payer: Self-pay | Admitting: Hematology

## 2020-04-15 ENCOUNTER — Other Ambulatory Visit: Payer: Self-pay

## 2020-04-15 ENCOUNTER — Inpatient Hospital Stay: Payer: PPO

## 2020-04-15 VITALS — BP 123/71 | HR 75 | Temp 98.0°F | Resp 16

## 2020-04-15 DIAGNOSIS — C6210 Malignant neoplasm of unspecified descended testis: Secondary | ICD-10-CM

## 2020-04-15 DIAGNOSIS — C8339 Diffuse large B-cell lymphoma, extranodal and solid organ sites: Secondary | ICD-10-CM

## 2020-04-15 DIAGNOSIS — Z7189 Other specified counseling: Secondary | ICD-10-CM

## 2020-04-15 DIAGNOSIS — Z5112 Encounter for antineoplastic immunotherapy: Secondary | ICD-10-CM | POA: Diagnosis not present

## 2020-04-15 MED ORDER — ACETAMINOPHEN 325 MG PO TABS
ORAL_TABLET | ORAL | Status: AC
  Filled 2020-04-15: qty 2

## 2020-04-15 MED ORDER — SODIUM CHLORIDE 0.9% FLUSH
10.0000 mL | INTRAVENOUS | Status: DC | PRN
  Administered 2020-04-15: 10 mL
  Filled 2020-04-15: qty 10

## 2020-04-15 MED ORDER — DIPHENHYDRAMINE HCL 25 MG PO CAPS
50.0000 mg | ORAL_CAPSULE | Freq: Once | ORAL | Status: AC
  Administered 2020-04-15: 50 mg via ORAL

## 2020-04-15 MED ORDER — ACETAMINOPHEN 325 MG PO TABS
650.0000 mg | ORAL_TABLET | Freq: Once | ORAL | Status: AC
  Administered 2020-04-15: 650 mg via ORAL

## 2020-04-15 MED ORDER — DIPHENHYDRAMINE HCL 25 MG PO CAPS
ORAL_CAPSULE | ORAL | Status: AC
  Filled 2020-04-15: qty 1

## 2020-04-15 MED ORDER — PALONOSETRON HCL INJECTION 0.25 MG/5ML
0.2500 mg | Freq: Once | INTRAVENOUS | Status: AC
  Administered 2020-04-15: 0.25 mg via INTRAVENOUS

## 2020-04-15 MED ORDER — HEPARIN SOD (PORK) LOCK FLUSH 100 UNIT/ML IV SOLN
500.0000 [IU] | Freq: Once | INTRAVENOUS | Status: AC | PRN
  Administered 2020-04-15: 500 [IU]
  Filled 2020-04-15: qty 5

## 2020-04-15 MED ORDER — VINCRISTINE SULFATE CHEMO INJECTION 1 MG/ML
1.0000 mg | Freq: Once | INTRAVENOUS | Status: AC
  Administered 2020-04-15: 1 mg via INTRAVENOUS
  Filled 2020-04-15: qty 1

## 2020-04-15 MED ORDER — SODIUM CHLORIDE 0.9 % IV SOLN
500.0000 mg/m2 | Freq: Once | INTRAVENOUS | Status: AC
  Administered 2020-04-15: 1060 mg via INTRAVENOUS
  Filled 2020-04-15: qty 53

## 2020-04-15 MED ORDER — SODIUM CHLORIDE 0.9 % IV SOLN
75.0000 mg/m2 | Freq: Once | INTRAVENOUS | Status: AC
  Administered 2020-04-15: 160 mg via INTRAVENOUS
  Filled 2020-04-15: qty 8

## 2020-04-15 MED ORDER — PALONOSETRON HCL INJECTION 0.25 MG/5ML
INTRAVENOUS | Status: AC
  Filled 2020-04-15: qty 5

## 2020-04-15 MED ORDER — SODIUM CHLORIDE 0.9 % IV SOLN
Freq: Once | INTRAVENOUS | Status: AC
  Filled 2020-04-15: qty 250

## 2020-04-15 MED ORDER — SODIUM CHLORIDE 0.9 % IV SOLN
375.0000 mg/m2 | Freq: Once | INTRAVENOUS | Status: AC
  Administered 2020-04-15: 800 mg via INTRAVENOUS
  Filled 2020-04-15: qty 50

## 2020-04-15 MED ORDER — SODIUM CHLORIDE 0.9 % IV SOLN
10.0000 mg | Freq: Once | INTRAVENOUS | Status: AC
  Administered 2020-04-15: 10 mg via INTRAVENOUS
  Filled 2020-04-15: qty 10

## 2020-04-15 NOTE — Patient Instructions (Signed)
[facility] Discharge Instructions for Patients Receiving Chemotherapy  Today you received the following chemotherapy agents Vincristine, Cytoxan, Etoposide and Rituximab  To help prevent nausea and vomiting after your treatment, we encourage you to take your nausea medication as directed. No Zofran for 3 days. Take Compazine instead.    If you develop nausea and vomiting that is not controlled by your nausea medication, call the clinic.   BELOW ARE SYMPTOMS THAT SHOULD BE REPORTED IMMEDIATELY:  *FEVER GREATER THAN 100.5 F  *CHILLS WITH OR WITHOUT FEVER  NAUSEA AND VOMITING THAT IS NOT CONTROLLED WITH YOUR NAUSEA MEDICATION  *UNUSUAL SHORTNESS OF BREATH  *UNUSUAL BRUISING OR BLEEDING  TENDERNESS IN MOUTH AND THROAT WITH OR WITHOUT PRESENCE OF ULCERS  *URINARY PROBLEMS  *BOWEL PROBLEMS  UNUSUAL RASH Items with * indicate a potential emergency and should be followed up as soon as possible.  Feel free to call the clinic should you have any questions or concerns. The clinic phone number is [phone].  Please show the [place] at check-in to the Emergency Department and triage nurse.   Vincristine injection What is this medicine? VINCRISTINE (vin KRIS teen) is a chemotherapy drug. It slows the growth of cancer cells. This medicine is used to treat many types of cancer like Hodgkin's disease, leukemia, non-Hodgkin's lymphoma, neuroblastoma (brain cancer), rhabdomyosarcoma, and Wilms' tumor. This medicine may be used for other purposes; ask your health care provider or pharmacist if you have questions. COMMON BRAND NAME(S): Oncovin, Vincasar PFS What should I tell my health care provider before I take this medicine? They need to know if you have any of these conditions:  blood disorders  gout  infection (especially chickenpox, cold sores, or herpes)  kidney disease  liver disease  lung disease  nervous system disease like Charcot-Marie-Tooth  (CMT)  recent or ongoing radiation therapy  an unusual or allergic reaction to vincristine, other chemotherapy agents, other medicines, foods, dyes, or preservatives  pregnant or trying to get pregnant  breast-feeding How should I use this medicine? This drug is given as an infusion into a vein. It is administered in a hospital or clinic by a specially trained health care professional. If you have pain, swelling, burning, or any unusual feeling around the site of your injection, tell your health care professional right away. Talk to your pediatrician regarding the use of this medicine in children. While this drug may be prescribed for selected conditions, precautions do apply. Overdosage: If you think you have taken too much of this medicine contact a poison control center or emergency room at once. NOTE: This medicine is only for you. Do not share this medicine with others. What if I miss a dose? It is important not to miss your dose. Call your doctor or health care professional if you are unable to keep an appointment. What may interact with this medicine? Do not take this medicine with any of the following medications:  itraconazole  mibefradil  voriconazole This medicine may also interact with the following medications:  cyclosporine  erythromycin  fluconazole  ketoconazole  medicines for HIV like delavirdine, efavirenz, nevirapine  medicines for seizures like ethotoin, fosphenotoin, phenytoin  medicines to increase blood counts like filgrastim, pegfilgrastim, sargramostim  other chemotherapy drugs like cisplatin, L-asparaginase, methotrexate, mitomycin, paclitaxel  pegaspargase  vaccines  zalcitabine, ddC Talk to your doctor or health care professional before taking any of these medicines:  acetaminophen  aspirin  ibuprofen  ketoprofen  naproxen This list may not  describe all possible interactions. Give your health care provider a list of all the  medicines, herbs, non-prescription drugs, or dietary supplements you use. Also tell them if you smoke, drink alcohol, or use illegal drugs. Some items may interact with your medicine. What should I watch for while using this medicine? This drug may make you feel generally unwell. This is not uncommon, as chemotherapy can affect healthy cells as well as cancer cells. Report any side effects. Continue your course of treatment even though you feel ill unless your doctor tells you to stop. You may need blood work done while you are taking this medicine. This medicine will cause constipation. Try to have a bowel movement at least every 2 to 3 days. If you do not have a bowel movement for 3 days, call your doctor or health care professional. In some cases, you may be given additional medicines to help with side effects. Follow all directions for their use. Do not become pregnant while taking this medicine. Women should inform their doctor if they wish to become pregnant or think they might be pregnant. There is a potential for serious side effects to an unborn child. Talk to your health care professional or pharmacist for more information. Do not breast-feed an infant while taking this medicine. This medicine may make it more difficult to get pregnant or to father a child. Talk to your healthcare professional if you are concerned about your fertility. What side effects may I notice from receiving this medicine? Side effects that you should report to your doctor or health care professional as soon as possible:  allergic reactions like skin rash, itching or hives, swelling of the face, lips, or tongue  breathing problems  confusion or changes in emotions or moods  constipation  cough  mouth sores  muscle weakness  nausea and vomiting  pain, swelling, redness or irritation at the injection site  pain, tingling, numbness in the hands or feet  problems with balance, talking,  walking  seizures  stomach pain  trouble passing urine or change in the amount of urine Side effects that usually do not require medical attention (report to your doctor or health care professional if they continue or are bothersome):  diarrhea  hair loss  jaw pain  loss of appetite This list may not describe all possible side effects. Call your doctor for medical advice about side effects. You may report side effects to FDA at 1-800-FDA-1088. Where should I keep my medicine? This drug is given in a hospital or clinic and will not be stored at home. NOTE: This sheet is a summary. It may not cover all possible information. If you have questions about this medicine, talk to your doctor, pharmacist, or health care provider.  2020 Elsevier/Gold Standard (2018-07-06 08:55:02)  Cyclophosphamide Injection What is this medicine? CYCLOPHOSPHAMIDE (sye kloe FOSS fa mide) is a chemotherapy drug. It slows the growth of cancer cells. This medicine is used to treat many types of cancer like lymphoma, myeloma, leukemia, breast cancer, and ovarian cancer, to name a few. This medicine may be used for other purposes; ask your health care provider or pharmacist if you have questions. COMMON BRAND NAME(S): Cytoxan, Neosar What should I tell my health care provider before I take this medicine? They need to know if you have any of these conditions:  heart disease  history of irregular heartbeat  infection  kidney disease  liver disease  low blood counts, like white cells, platelets, or red blood cells  on hemodialysis  recent or ongoing radiation therapy  scarring or thickening of the lungs  trouble passing urine  an unusual or allergic reaction to cyclophosphamide, other medicines, foods, dyes, or preservatives  pregnant or trying to get pregnant  breast-feeding How should I use this medicine? This drug is usually given as an injection into a vein or muscle or by infusion into a  vein. It is administered in a hospital or clinic by a specially trained health care professional. Talk to your pediatrician regarding the use of this medicine in children. Special care may be needed. Overdosage: If you think you have taken too much of this medicine contact a poison control center or emergency room at once. NOTE: This medicine is only for you. Do not share this medicine with others. What if I miss a dose? It is important not to miss your dose. Call your doctor or health care professional if you are unable to keep an appointment. What may interact with this medicine?  amphotericin B  azathioprine  certain antivirals for HIV or hepatitis  certain medicines for blood pressure, heart disease, irregular heart beat  certain medicines that treat or prevent blood clots like warfarin  certain other medicines for cancer  cyclosporine  etanercept  indomethacin  medicines that relax muscles for surgery  medicines to increase blood counts  metronidazole This list may not describe all possible interactions. Give your health care provider a list of all the medicines, herbs, non-prescription drugs, or dietary supplements you use. Also tell them if you smoke, drink alcohol, or use illegal drugs. Some items may interact with your medicine. What should I watch for while using this medicine? Your condition will be monitored carefully while you are receiving this medicine. You may need blood work done while you are taking this medicine. Drink water or other fluids as directed. Urinate often, even at night. Some products may contain alcohol. Ask your health care professional if this medicine contains alcohol. Be sure to tell all health care professionals you are taking this medicine. Certain medicines, like metronidazole and disulfiram, can cause an unpleasant reaction when taken with alcohol. The reaction includes flushing, headache, nausea, vomiting, sweating, and increased thirst.  The reaction can last from 30 minutes to several hours. Do not become pregnant while taking this medicine or for 1 year after stopping it. Women should inform their health care professional if they wish to become pregnant or think they might be pregnant. Men should not father a child while taking this medicine and for 4 months after stopping it. There is potential for serious side effects to an unborn child. Talk to your health care professional for more information. Do not breast-feed an infant while taking this medicine or for 1 week after stopping it. This medicine has caused ovarian failure in some women. This medicine may make it more difficult to get pregnant. Talk to your health care professional if you are concerned about your fertility. This medicine has caused decreased sperm counts in some men. This may make it more difficult to father a child. Talk to your health care professional if you are concerned about your fertility. Call your health care professional for advice if you get a fever, chills, or sore throat, or other symptoms of a cold or flu. Do not treat yourself. This medicine decreases your body's ability to fight infections. Try to avoid being around people who are sick. Avoid taking medicines that contain aspirin, acetaminophen, ibuprofen, naproxen, or ketoprofen unless instructed by  your health care professional. These medicines may hide a fever. Talk to your health care professional about your risk of cancer. You may be more at risk for certain types of cancer if you take this medicine. If you are going to need surgery or other procedure, tell your health care professional that you are using this medicine. Be careful brushing or flossing your teeth or using a toothpick because you may get an infection or bleed more easily. If you have any dental work done, tell your dentist you are receiving this medicine. What side effects may I notice from receiving this medicine? Side effects  that you should report to your doctor or health care professional as soon as possible:  allergic reactions like skin rash, itching or hives, swelling of the face, lips, or tongue  breathing problems  nausea, vomiting  signs and symptoms of bleeding such as bloody or black, tarry stools; red or dark brown urine; spitting up blood or brown material that looks like coffee grounds; red spots on the skin; unusual bruising or bleeding from the eyes, gums, or nose  signs and symptoms of heart failure like fast, irregular heartbeat, sudden weight gain; swelling of the ankles, feet, hands  signs and symptoms of infection like fever; chills; cough; sore throat; pain or trouble passing urine  signs and symptoms of kidney injury like trouble passing urine or change in the amount of urine  signs and symptoms of liver injury like dark yellow or brown urine; general ill feeling or flu-like symptoms; light-colored stools; loss of appetite; nausea; right upper belly pain; unusually weak or tired; yellowing of the eyes or skin Side effects that usually do not require medical attention (report to your doctor or health care professional if they continue or are bothersome):  confusion  decreased hearing  diarrhea  facial flushing  hair loss  headache  loss of appetite  missed menstrual periods  signs and symptoms of low red blood cells or anemia such as unusually weak or tired; feeling faint or lightheaded; falls  skin discoloration This list may not describe all possible side effects. Call your doctor for medical advice about side effects. You may report side effects to FDA at 1-800-FDA-1088. Where should I keep my medicine? This drug is given in a hospital or clinic and will not be stored at home. NOTE: This sheet is a summary. It may not cover all possible information. If you have questions about this medicine, talk to your doctor, pharmacist, or health care provider.  2020 Elsevier/Gold  Standard (2019-07-08 09:53:29)  Etoposide, VP-16 injection What is this medicine? ETOPOSIDE, VP-16 (e toe POE side) is a chemotherapy drug. It is used to treat testicular cancer, lung cancer, and other cancers. This medicine may be used for other purposes; ask your health care provider or pharmacist if you have questions. COMMON BRAND NAME(S): Etopophos, Toposar, VePesid What should I tell my health care provider before I take this medicine? They need to know if you have any of these conditions:  infection  kidney disease  liver disease  low blood counts, like low white cell, platelet, or red cell counts  an unusual or allergic reaction to etoposide, other medicines, foods, dyes, or preservatives  pregnant or trying to get pregnant  breast-feeding How should I use this medicine? This medicine is for infusion into a vein. It is administered in a hospital or clinic by a specially trained health care professional. Talk to your pediatrician regarding the use of this medicine  in children. Special care may be needed. Overdosage: If you think you have taken too much of this medicine contact a poison control center or emergency room at once. NOTE: This medicine is only for you. Do not share this medicine with others. What if I miss a dose? It is important not to miss your dose. Call your doctor or health care professional if you are unable to keep an appointment. What may interact with this medicine? This medicine may interact with the following medications:  warfarin This list may not describe all possible interactions. Give your health care provider a list of all the medicines, herbs, non-prescription drugs, or dietary supplements you use. Also tell them if you smoke, drink alcohol, or use illegal drugs. Some items may interact with your medicine. What should I watch for while using this medicine? Visit your doctor for checks on your progress. This drug may make you feel generally  unwell. This is not uncommon, as chemotherapy can affect healthy cells as well as cancer cells. Report any side effects. Continue your course of treatment even though you feel ill unless your doctor tells you to stop. In some cases, you may be given additional medicines to help with side effects. Follow all directions for their use. Call your doctor or health care professional for advice if you get a fever, chills or sore throat, or other symptoms of a cold or flu. Do not treat yourself. This drug decreases your body's ability to fight infections. Try to avoid being around people who are sick. This medicine may increase your risk to bruise or bleed. Call your doctor or health care professional if you notice any unusual bleeding. Talk to your doctor about your risk of cancer. You may be more at risk for certain types of cancers if you take this medicine. Do not become pregnant while taking this medicine or for at least 6 months after stopping it. Women should inform their doctor if they wish to become pregnant or think they might be pregnant. Women of child-bearing potential will need to have a negative pregnancy test before starting this medicine. There is a potential for serious side effects to an unborn child. Talk to your health care professional or pharmacist for more information. Do not breast-feed an infant while taking this medicine. Men must use a latex condom during sexual contact with a woman while taking this medicine and for at least 4 months after stopping it. A latex condom is needed even if you have had a vasectomy. Contact your doctor right away if your partner becomes pregnant. Do not donate sperm while taking this medicine and for at least 4 months after you stop taking this medicine. Men should inform their doctors if they wish to father a child. This medicine may lower sperm counts. What side effects may I notice from receiving this medicine? Side effects that you should report to your  doctor or health care professional as soon as possible:  allergic reactions like skin rash, itching or hives, swelling of the face, lips, or tongue  low blood counts - this medicine may decrease the number of white blood cells, red blood cells, and platelets. You may be at increased risk for infections and bleeding  nausea, vomiting  redness, blistering, peeling or loosening of the skin, including inside the mouth  signs and symptoms of infection like fever; chills; cough; sore throat; pain or trouble passing urine  signs and symptoms of low red blood cells or anemia such as unusually  weak or tired; feeling faint or lightheaded; falls; breathing problems  unusual bruising or bleeding Side effects that usually do not require medical attention (report to your doctor or health care professional if they continue or are bothersome):  changes in taste  diarrhea  hair loss  loss of appetite  mouth sores This list may not describe all possible side effects. Call your doctor for medical advice about side effects. You may report side effects to FDA at 1-800-FDA-1088. Where should I keep my medicine? This drug is given in a hospital or clinic and will not be stored at home. NOTE: This sheet is a summary. It may not cover all possible information. If you have questions about this medicine, talk to your doctor, pharmacist, or health care provider.  2020 Elsevier/Gold Standard (2018-11-28 16:57:15)  Rituximab injection What is this medicine? RITUXIMAB (ri TUX i mab) is a monoclonal antibody. It is used to treat certain types of cancer like non-Hodgkin lymphoma and chronic lymphocytic leukemia. It is also used to treat rheumatoid arthritis, granulomatosis with polyangiitis (or Wegener's granulomatosis), microscopic polyangiitis, and pemphigus vulgaris. This medicine may be used for other purposes; ask your health care provider or pharmacist if you have questions. COMMON BRAND NAME(S): Rituxan,  RUXIENCE What should I tell my health care provider before I take this medicine? They need to know if you have any of these conditions:  heart disease  infection (especially a virus infection such as hepatitis B, chickenpox, cold sores, or herpes)  immune system problems  irregular heartbeat  kidney disease  low blood counts, like low white cell, platelet, or red cell counts  lung or breathing disease, like asthma  recently received or scheduled to receive a vaccine  an unusual or allergic reaction to rituximab, other medicines, foods, dyes, or preservatives  pregnant or trying to get pregnant  breast-feeding How should I use this medicine? This medicine is for infusion into a vein. It is administered in a hospital or clinic by a specially trained health care professional. A special MedGuide will be given to you by the pharmacist with each prescription and refill. Be sure to read this information carefully each time. Talk to your pediatrician regarding the use of this medicine in children. This medicine is not approved for use in children. Overdosage: If you think you have taken too much of this medicine contact a poison control center or emergency room at once. NOTE: This medicine is only for you. Do not share this medicine with others. What if I miss a dose? It is important not to miss a dose. Call your doctor or health care professional if you are unable to keep an appointment. What may interact with this medicine?  cisplatin  live virus vaccines This list may not describe all possible interactions. Give your health care provider a list of all the medicines, herbs, non-prescription drugs, or dietary supplements you use. Also tell them if you smoke, drink alcohol, or use illegal drugs. Some items may interact with your medicine. What should I watch for while using this medicine? Your condition will be monitored carefully while you are receiving this medicine. You may need  blood work done while you are taking this medicine. This medicine can cause serious allergic reactions. To reduce your risk you may need to take medicine before treatment with this medicine. Take your medicine as directed. In some patients, this medicine may cause a serious brain infection that may cause death. If you have any problems seeing, thinking, speaking,  walking, or standing, tell your healthcare professional right away. If you cannot reach your healthcare professional, urgently seek other source of medical care. Call your doctor or health care professional for advice if you get a fever, chills or sore throat, or other symptoms of a cold or flu. Do not treat yourself. This drug decreases your body's ability to fight infections. Try to avoid being around people who are sick. Do not become pregnant while taking this medicine or for at least 12 months after stopping it. Women should inform their doctor if they wish to become pregnant or think they might be pregnant. There is a potential for serious side effects to an unborn child. Talk to your health care professional or pharmacist for more information. Do not breast-feed an infant while taking this medicine or for at least 6 months after stopping it. What side effects may I notice from receiving this medicine? Side effects that you should report to your doctor or health care professional as soon as possible:  allergic reactions like skin rash, itching or hives; swelling of the face, lips, or tongue  breathing problems  chest pain  changes in vision  diarrhea  headache with fever, neck stiffness, sensitivity to light, nausea, or confusion  fast, irregular heartbeat  loss of memory  low blood counts - this medicine may decrease the number of white blood cells, red blood cells and platelets. You may be at increased risk for infections and bleeding.  mouth sores  problems with balance, talking, or walking  redness, blistering,  peeling or loosening of the skin, including inside the mouth  signs of infection - fever or chills, cough, sore throat, pain or difficulty passing urine  signs and symptoms of kidney injury like trouble passing urine or change in the amount of urine  signs and symptoms of liver injury like dark yellow or brown urine; general ill feeling or flu-like symptoms; light-colored stools; loss of appetite; nausea; right upper belly pain; unusually weak or tired; yellowing of the eyes or skin  signs and symptoms of low blood pressure like dizziness; feeling faint or lightheaded, falls; unusually weak or tired  stomach pain  swelling of the ankles, feet, hands  unusual bleeding or bruising  vomiting Side effects that usually do not require medical attention (report to your doctor or health care professional if they continue or are bothersome):  headache  joint pain  muscle cramps or muscle pain  nausea  tiredness This list may not describe all possible side effects. Call your doctor for medical advice about side effects. You may report side effects to FDA at 1-800-FDA-1088. Where should I keep my medicine? This drug is given in a hospital or clinic and will not be stored at home. NOTE: This sheet is a summary. It may not cover all possible information. If you have questions about this medicine, talk to your doctor, pharmacist, or health care provider.  2020 Elsevier/Gold Standard (2018-11-14 22:01:36)

## 2020-04-15 NOTE — Progress Notes (Addendum)
Clinic is closed on Monday. Patient to receive Neulasta Onpro with this cycle with D3 chemo. No auth required per PA team.   Demetrius Charity, PharmD, BCPS, [facility] Pharmacist Pharmacy Phone: [phone] 04/15/2020  Addendum: Patient requested Onpro for all future cycles, not just this one. Orders updated.   Demetrius Charity, PharmD, BCPS, [facility] Pharmacist Pharmacy Phone: [phone] 04/16/2020

## 2020-04-16 ENCOUNTER — Other Ambulatory Visit: Payer: Self-pay

## 2020-04-16 ENCOUNTER — Inpatient Hospital Stay: Payer: PPO | Attending: Hematology

## 2020-04-16 VITALS — BP 135/81 | HR 98 | Temp 98.5°F | Resp 16

## 2020-04-16 DIAGNOSIS — Z5111 Encounter for antineoplastic chemotherapy: Secondary | ICD-10-CM | POA: Diagnosis not present

## 2020-04-16 DIAGNOSIS — Z5189 Encounter for other specified aftercare: Secondary | ICD-10-CM | POA: Insufficient documentation

## 2020-04-16 DIAGNOSIS — Z7189 Other specified counseling: Secondary | ICD-10-CM

## 2020-04-16 DIAGNOSIS — C6210 Malignant neoplasm of unspecified descended testis: Secondary | ICD-10-CM

## 2020-04-16 DIAGNOSIS — C8339 Diffuse large B-cell lymphoma, extranodal and solid organ sites: Secondary | ICD-10-CM | POA: Insufficient documentation

## 2020-04-16 DIAGNOSIS — Z79899 Other long term (current) drug therapy: Secondary | ICD-10-CM | POA: Diagnosis not present

## 2020-04-16 MED ORDER — PROCHLORPERAZINE MALEATE 10 MG PO TABS
ORAL_TABLET | ORAL | Status: AC
  Filled 2020-04-16: qty 1

## 2020-04-16 MED ORDER — SODIUM CHLORIDE 0.9 % IV SOLN
75.0000 mg/m2 | Freq: Once | INTRAVENOUS | Status: AC
  Administered 2020-04-16: 160 mg via INTRAVENOUS
  Filled 2020-04-16: qty 8

## 2020-04-16 MED ORDER — SODIUM CHLORIDE 0.9% FLUSH
10.0000 mL | INTRAVENOUS | Status: DC | PRN
  Administered 2020-04-16: 10 mL
  Filled 2020-04-16: qty 10

## 2020-04-16 MED ORDER — PROCHLORPERAZINE MALEATE 10 MG PO TABS
10.0000 mg | ORAL_TABLET | Freq: Once | ORAL | Status: AC
  Administered 2020-04-16: 10 mg via ORAL

## 2020-04-16 MED ORDER — HEPARIN SOD (PORK) LOCK FLUSH 100 UNIT/ML IV SOLN
500.0000 [IU] | Freq: Once | INTRAVENOUS | Status: AC | PRN
  Administered 2020-04-16: 500 [IU]
  Filled 2020-04-16: qty 5

## 2020-04-16 MED ORDER — SODIUM CHLORIDE 0.9 % IV SOLN
Freq: Once | INTRAVENOUS | Status: AC
  Filled 2020-04-16: qty 250

## 2020-04-16 NOTE — Patient Instructions (Signed)
[facility] Discharge Instructions for Patients Receiving Chemotherapy  Today you received the following chemotherapy agents: Etoposide   If you develop nausea and vomiting that is not controlled by your nausea medication, call the clinic.   BELOW ARE SYMPTOMS THAT SHOULD BE REPORTED IMMEDIATELY:  *FEVER GREATER THAN 100.5 F  *CHILLS WITH OR WITHOUT FEVER  NAUSEA AND VOMITING THAT IS NOT CONTROLLED WITH YOUR NAUSEA MEDICATION  *UNUSUAL SHORTNESS OF BREATH  *UNUSUAL BRUISING OR BLEEDING  TENDERNESS IN MOUTH AND THROAT WITH OR WITHOUT PRESENCE OF ULCERS  *URINARY PROBLEMS  *BOWEL PROBLEMS  UNUSUAL RASH Items with * indicate a potential emergency and should be followed up as soon as possible.  Feel free to call the clinic should you have any questions or concerns. The clinic phone number is [phone].  Please show the CHEMO ALERT CARD at check-in to the Emergency Department and triage nurse.     

## 2020-04-17 ENCOUNTER — Other Ambulatory Visit: Payer: Self-pay

## 2020-04-17 ENCOUNTER — Other Ambulatory Visit: Payer: PPO

## 2020-04-17 ENCOUNTER — Inpatient Hospital Stay: Payer: PPO

## 2020-04-17 VITALS — BP 131/71 | HR 71 | Temp 98.1°F

## 2020-04-17 DIAGNOSIS — C8339 Diffuse large B-cell lymphoma, extranodal and solid organ sites: Secondary | ICD-10-CM

## 2020-04-17 DIAGNOSIS — Z5111 Encounter for antineoplastic chemotherapy: Secondary | ICD-10-CM | POA: Diagnosis not present

## 2020-04-17 DIAGNOSIS — C6210 Malignant neoplasm of unspecified descended testis: Secondary | ICD-10-CM

## 2020-04-17 DIAGNOSIS — Z7189 Other specified counseling: Secondary | ICD-10-CM

## 2020-04-17 MED ORDER — PEGFILGRASTIM 6 MG/0.6ML ~~LOC~~ PSKT
PREFILLED_SYRINGE | SUBCUTANEOUS | Status: AC
  Filled 2020-04-17: qty 0.6

## 2020-04-17 MED ORDER — SODIUM CHLORIDE 0.9 % IV SOLN
75.0000 mg/m2 | Freq: Once | INTRAVENOUS | Status: AC
  Administered 2020-04-17: 160 mg via INTRAVENOUS
  Filled 2020-04-17: qty 8

## 2020-04-17 MED ORDER — SODIUM CHLORIDE 0.9 % IV SOLN
Freq: Once | INTRAVENOUS | Status: AC
  Filled 2020-04-17: qty 250

## 2020-04-17 MED ORDER — HEPARIN SOD (PORK) LOCK FLUSH 100 UNIT/ML IV SOLN
500.0000 [IU] | Freq: Once | INTRAVENOUS | Status: AC | PRN
  Administered 2020-04-17: 500 [IU]
  Filled 2020-04-17: qty 5

## 2020-04-17 MED ORDER — SODIUM CHLORIDE 0.9% FLUSH
10.0000 mL | INTRAVENOUS | Status: DC | PRN
  Administered 2020-04-17: 10 mL
  Filled 2020-04-17: qty 10

## 2020-04-17 MED ORDER — PROCHLORPERAZINE MALEATE 10 MG PO TABS
10.0000 mg | ORAL_TABLET | Freq: Once | ORAL | Status: AC
  Administered 2020-04-17: 10 mg via ORAL

## 2020-04-17 MED ORDER — PROCHLORPERAZINE MALEATE 10 MG PO TABS
ORAL_TABLET | ORAL | Status: AC
  Filled 2020-04-17: qty 1

## 2020-04-17 MED ORDER — PEGFILGRASTIM 6 MG/0.6ML ~~LOC~~ PSKT
6.0000 mg | PREFILLED_SYRINGE | Freq: Once | SUBCUTANEOUS | Status: AC
  Administered 2020-04-17: 6 mg via SUBCUTANEOUS

## 2020-04-17 NOTE — Patient Instructions (Signed)
[facility] Discharge Instructions for Patients Receiving Chemotherapy  Today you received the following chemotherapy agents: Etoposide   If you develop nausea and vomiting that is not controlled by your nausea medication, call the clinic.   BELOW ARE SYMPTOMS THAT SHOULD BE REPORTED IMMEDIATELY:  *FEVER GREATER THAN 100.5 F  *CHILLS WITH OR WITHOUT FEVER  NAUSEA AND VOMITING THAT IS NOT CONTROLLED WITH YOUR NAUSEA MEDICATION  *UNUSUAL SHORTNESS OF BREATH  *UNUSUAL BRUISING OR BLEEDING  TENDERNESS IN MOUTH AND THROAT WITH OR WITHOUT PRESENCE OF ULCERS  *URINARY PROBLEMS  *BOWEL PROBLEMS  UNUSUAL RASH Items with * indicate a potential emergency and should be followed up as soon as possible.  Feel free to call the clinic should you have any questions or concerns. The clinic phone number is [phone].  Please show the CHEMO ALERT CARD at check-in to the Emergency Department and triage nurse.     

## 2020-04-18 ENCOUNTER — Ambulatory Visit: Payer: PPO

## 2020-04-24 ENCOUNTER — Telehealth: Payer: Self-pay | Admitting: Hematology

## 2020-04-24 NOTE — Telephone Encounter (Signed)
Scheduled per 06/28 los, called and spoke with patient's relative. Patient will be notified of upcoming appointments.

## 2020-04-29 DIAGNOSIS — E291 Testicular hypofunction: Secondary | ICD-10-CM | POA: Diagnosis not present

## 2020-05-05 NOTE — Progress Notes (Signed)
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 male who has been referred to [place] by Dr. Hollace Kinnier for evaluation and management of Bone lesion of left lower leg. The pt reports that he is doing well overall.   The pt reports that he cannot feel the ovoid finding from his 04/09/18 MRI as noted below. He denies being able to feel anything in his skin as well. He notes that he received a needle injection in his knee prior to this MRI. The pt notes that his knee has lost much cartilage and is considering a knee replacement. He denies any recent injuries to his knee or falls. He has contacted Dr. Alphonsa Overall in surgery and another surgeon as well and will be making an appointment.   The pt notes that he does not feel any differently recently as compared to 6 months to a year ago. He does note that he normally has constipation in the spring, and has continued to have mild constipation. He denies any other concerns or symptoms.   The pt previously saw my colleague Dr. Lurline Del for primary testicular B-cell lymphoma in 2000. The pt notes that the involvement was limited to only one testicle. He received 6 intrathecal treatments in his spine, and received 4 cycles of chemotherapy (thinks this was CHOP) and radiation as well. The pt was followed by an oncologist for several years at the [facility] [place] in [place] after moving from [place] after his [facility] treatment.   The pt notes that he was first diagnosed with melanoma in October 2000. He noticed a strange spot on his left leg that was surgically resected. The pt's personal notes report a 2.9cm thick, level 4, no LN involvement, high risk stage  II, treated with Interferon three times a week for one year. He notes some squamous cell and basal cell involvements as well and sees a dermatologist, Dr. Wilhemina Bonito, regularly.   Of note prior to the patient's visit today, pt has had MRI Left Knee completed on 04/09/18 with results revealing An ovoid, lobulated, 17m T2 hyperintense focus within the medial subcutaneous far 1.956mdistal to the joint demonstrates demonstrates nonspecific imaging characteristics.   Most recent lab results (03/01/18) of CBC w/diff is as follows: all values are WNL except for RBC at 4.54, MCV at 100.4, MCH at 33.8.   On review of systems, pt reports left knee pain, mild constipation, and denies new or concerning symptoms, noticing any new lumps or bumps, pain along the spine, abdominal pains, problems passing urine, testicular pain/swelling, and any other symptoms.   Interval History:   WiKAYN HAYMOREeturns today for management and evaluation evaluation of new testicular mass. He is here prior to C3D1 R-CEOP. The patient's last visit with [place] on 04/13/2020. The pt reports that he is doing well overall.  The pt reports that he has noticed an increase in fatigue, but is still able to complete the tasks that he desires. Pt is walking at least 20-30 minutes per day. He experiences constipation after treatment, but is able to resolve this with OTC products. He will be taking those products in a prophylactic manor prior to this upcoming cycle. Pt denies any  issues at the surgical site.   He is interested in traveling towards the end of September.   Lab results today (05/06/20) of CBC w/diff and CMP is as follows: all values are WNL except for RBC at 3.62, Hgb at 12.2, HCT at 37.5, MCV at 103.6, Abs Immature Granulocytes at 0.29K, Glucose at 137. 05/06/2020 LDH at 178  On review of systems, pt reports fatigue and denies fevers, chills, night sweats, mouth sores, constipation, tingling/numbness in hands/feet, nausea,  rashes, loss of appetite, pain at port site, abdominal pain, groin pain/swelling and any other symptoms.   MEDICAL HISTORY:  Past Medical History:  Diagnosis Date  . Age-related macular degeneration, dry, both eyes   . 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 Carotid stenosis, asymptomatic, left ?  . ED (erectile dysfunction)   . Elevated diaphragm    right  . Erectile dysfunction   . History of basal cell carcinoma (BCC) excision    multiple excision in office  . History of kidney stones   . History of seizure    03-03-2020 per pt x1 while taking interferan for melanoma in 2001 ,  none since, told a side effect of interferan  . History of squamous cell carcinoma excision    multiple skin excision in office  . Hyperlipidemia   . Hypertension    followed by pcp  (03-03-2020 per pt had a stress test approx. 2010, told normal)  . LBBB (left bundle branch block)   . Lymphoma of testis ([place]) 2000   s/p right orchiectomy   . Mass of left testis   . MGUS (monoclonal gammopathy of unknown significance)    oncologist--- dr Irene Limbo  . Personal history of malignant melanoma of skin dermatologist--- dr Wilhemina Bonito   dx 2001 left calf area  s/p WLE ,  completed one year interferan (03-03-2020 pt states did not involve lymph nodes, no recurrence)  . Urgency of urination   . Wears hearing aid in both ears     SURGICAL HISTORY: Past Surgical History:  Procedure Laterality Date  . CATARACT EXTRACTION W/ INTRAOCULAR LENS IMPLANT Bilateral 2018  . IR IMAGING GUIDED PORT INSERTION  03/31/2020  . MELANOMA EXCISION  10/1999  . ORCHIECTOMY Right 2000  . ORCHIECTOMY Left 03/05/2020   Procedure: ORCHIECTOMY;  Surgeon: [name], MD;  Location: [facility];  Service: Urology;  Laterality: Left;  .  TONSILLECTOMY  child  . TOTAL KNEE ARTHROPLASTY Right 06/2009    SOCIAL HISTORY: Social History   Socioeconomic History  . Marital status: Married    Spouse name: Not on file  . Number of children: Not on file  . Years of education: Not on file  . Highest education level: Not on file  Occupational History  . Not on file  Tobacco Use  . Smoking status: Never Smoker  . Smokeless tobacco: Never Used  Vaping Use  . Vaping Use: Never used  Substance and Sexual Activity  . Alcohol use: Yes    Alcohol/week: 14.0 standard drinks    Types: 14 Glasses of wine per week    Comment: 2 wine daily  . Drug use: Never  . Sexual activity: Not on file  Other Topics Concern  . Not on file  Social History Narrative   Social History  Diet?good      Do you drink/eat things with caffeine? seldom      Marital status?          yes                          What year were you married? 1962      Do you live in a house, apartment, assisted living, condo, trailer, etc.? house      Is it one or more stories?1      How many persons live in your home? 2      Do you have any pets in your home? (please list) no      Highest level of education completed? college      Current or past profession: president and [employer]      Do you exercise?   yes                                   Type & how often? [employer] work, Teacher, adult education care      Do you have a living will? yes      Do you have a DNR form?       yes                            If not, do you want to discuss one?      Do you have signed POA/HPOA for forms?  yes      Functional Status      Do you have difficulty bathing or dressing yourself? no      Do you have difficulty preparing food or eating? no      Do you have difficulty managing your medications? no      Do you have difficulty managing your finances? no      Do you have difficulty affording your medications? no   Social Determinants of Health    Financial Resource Strain:   . Difficulty of Paying Living Expenses:   Food Insecurity:   . Worried About Charity fundraiser in the Last Year:   . Arboriculturist in the Last Year:   Transportation Needs:   . Film/video editor (Medical):   Marland Kitchen Lack of Transportation (Non-Medical):   Physical Activity:   . Days of Exercise per Week:   . Minutes of Exercise per Session:   Stress:   . Feeling of Stress :   Social Connections:   . Frequency of Communication with Friends and Family:   . Frequency of Social Gatherings with Friends and Family:   . Attends Religious Services:   . Active Member of Clubs or Organizations:   . Attends Archivist Meetings:   Marland Kitchen Marital Status:   Intimate Partner Violence:   . Fear of Current or Ex-Partner:   . Emotionally Abused:   Marland Kitchen Physically Abused:   . Sexually Abused:     FAMILY HISTORY: Family History  Problem Relation Age of Onset  . Cataracts Mother   . Transient ischemic attack Mother   . Asthma Mother   . Cataracts Father   . Diabetes Father        borderline  . Hypertension Father   . Hyperlipidemia Father   . Hyperlipidemia Son   . Hypertension  Son   . Amblyopia Neg Hx   . Blindness Neg Hx   . Glaucoma Neg Hx   . Macular degeneration Neg Hx   . Retinal detachment Neg Hx   . Strabismus Neg Hx   . Retinitis pigmentosa Neg Hx     ALLERGIES:  is allergic to latex.  MEDICATIONS:  Current Outpatient Medications  Medication Sig Dispense Refill  . calcium carbonate (TUMS - DOSED IN MG ELEMENTAL CALCIUM) 500 MG chewable tablet Chew 1 tablet by mouth as needed for indigestion or heartburn.    . Cholecalciferol (VITAMIN D3) 2000 units TABS Take 1 tablet by mouth daily.    . Cyanocobalamin (B-12 PO) Take 1 tablet by mouth daily.    . diphenhydramine-acetaminophen (TYLENOL PM) 25-500 MG TABS tablet Take 1 tablet by mouth at bedtime as needed.    Marland Kitchen Ketotifen Fumarate (REFRESH EYE ITCH RELIEF OP) Place 1 drop into both eyes  daily as needed (itchy eyes).     Marland Kitchen lidocaine-prilocaine (EMLA) cream     . losartan (COZAAR) 100 MG tablet Take 1 tablet (100 mg total) by mouth daily. 90 tablet 1  . meloxicam (MOBIC) 7.5 MG tablet TAKE 1 TABLET (7.5 MG TOTAL) BY MOUTH 2 (TWO) TIMES DAILY AS NEEDED FOR PAIN. (Patient taking differently: Take 7.5 mg by mouth daily. ) 180 tablet 0  . Multiple Vitamins-Minerals (PRESERVISION AREDS 2 PO) Take 1 capsule by mouth 2 (two) times daily.     . ondansetron (ZOFRAN) 8 MG tablet Take 1 tablet (8 mg total) by mouth every 8 (eight) hours as needed for nausea or vomiting. (Patient not taking: Reported on 03/30/2020) 30 tablet 0  . predniSONE (DELTASONE) 20 MG tablet Take 3 tablets (60 mg total) by mouth daily with breakfast. For 5 days with each cycle of chemotherapy 15 tablet 5  . prochlorperazine (COMPAZINE) 10 MG tablet Take 1 tablet (10 mg total) by mouth every 6 (six) hours as needed for nausea or vomiting. (Patient not taking: Reported on 03/30/2020) 30 tablet 0  . simvastatin (ZOCOR) 40 MG tablet TAKE 1 TABLET BY MOUTH EVERY DAY (Patient taking differently: Take 40 mg by mouth daily at 6 PM. ) 90 tablet 1  . Testosterone 20.25 MG/ACT (1.62%) GEL SMARTSIG:40.5 Milligram(s) Topical Every Morning    . valACYclovir (VALTREX) 1000 MG tablet Take one tablet by mouth twice daily for 3 days as needed for flares. 30 tablet 1   No current facility-administered medications for this visit.    REVIEW OF SYSTEMS:   A 10+ POINT REVIEW OF SYSTEMS WAS OBTAINED including neurology, dermatology, psychiatry, cardiac, respiratory, lymph, extremities, GI, GU, Musculoskeletal, constitutional, breasts, reproductive, HEENT.  All pertinent positives are noted in the HPI.  All others are negative.   PHYSICAL EXAMINATION: ECOG PERFORMANCE STATUS: 0 - Asymptomatic VS reviewed - stable  Vitals:   05/06/20 0854  BP: 136/77  Pulse: 81  Resp: 18  Temp: 97.7 F (36.5 C)  SpO2: 97%   Wt Readings from Last 3  Encounters:  05/06/20 192 lb 8 oz (87.3 kg)  04/13/20 192 lb 4.8 oz (87.2 kg)  04/01/20 191 lb 8 oz (86.9 kg)   Body mass index is 26.11 kg/m.    GENERAL:alert, in no acute distress and comfortable SKIN: no acute rashes, no significant lesions EYES: conjunctiva are pink and non-injected, sclera anicteric OROPHARYNX: MMM, no exudates, no oropharyngeal erythema or ulceration NECK: supple, no JVD LYMPH:  no palpable lymphadenopathy in the cervical, axillary or inguinal regions LUNGS:  clear to auscultation b/l with normal respiratory effort HEART: regular rate & rhythm ABDOMEN:  normoactive bowel sounds , non tender, not distended. No palpable hepatosplenomegaly.  Extremity: no pedal edema PSYCH: alert & oriented x 3 with fluent speech NEURO: no focal motor/sensory deficits  LABORATORY DATA:  I have reviewed the data as listed  . CBC Latest Ref Rng & Units 05/06/2020 04/13/2020 04/01/2020  WBC 4.0 - 10.5 K/uL 8.3 12.9(H) 9.5  Hemoglobin 13.0 - 17.0 g/dL 12.2(L) 12.1(L) 11.9(L)  Hematocrit 39 - 52 % 37.5(L) 37.6(L) 36.6(L)  Platelets 150 - 400 K/uL 214 178 159   . CBC    Component Value Date/Time   WBC 8.3 05/06/2020 0832   RBC 3.62 (L) 05/06/2020 0832   HGB 12.2 (L) 05/06/2020 0832   HGB 11.9 (L) 04/01/2020 1111   HCT 37.5 (L) 05/06/2020 0832   HCT 40.8 12/19/2019 1057   PLT 214 05/06/2020 0832   PLT 159 04/01/2020 1111   MCV 103.6 (H) 05/06/2020 0832   MCH 33.7 05/06/2020 0832   MCHC 32.5 05/06/2020 0832   RDW 15.1 05/06/2020 0832   LYMPHSABS 0.9 05/06/2020 0832   MONOABS 0.9 05/06/2020 0832   EOSABS 0.1 05/06/2020 0832   BASOSABS 0.1 05/06/2020 0832    . CMP Latest Ref Rng & Units 05/06/2020 04/13/2020 04/01/2020  Glucose 70 - 99 mg/dL 137(H) 111(H) 81  BUN 8 - 23 mg/dL '16 18 18  ' Creatinine 0.61 - 1.24 mg/dL 0.83 0.81 0.76  Sodium 135 - 145 mmol/L 142 144 139  Potassium 3.5 - 5.1 mmol/L 4.0 4.4 4.4  Chloride 98 - 111 mmol/L 108 109 103  CO2 22 - 32 mmol/L '26 25 29   ' Calcium 8.9 - 10.3 mg/dL 9.3 9.0 8.8(L)  Total Protein 6.5 - 8.1 g/dL 6.7 6.4(L) 6.5  Total Bilirubin 0.3 - 1.2 mg/dL 0.5 0.3 0.9  Alkaline Phos 38 - 126 U/L 109 124 63  AST 15 - 41 U/L 22 23 12(L)  ALT 0 - 44 U/L 25 32 24   03/05/2020 Left Testicular Flow Pathology Report [id number]):    03/05/2020 FISH Panel:    03/05/2020 Left Testicular Mass Surgical Pathology ([id number]):    Component     Latest Ref Rng & Units 12/19/2019  Folate, Hemolysate     Not Estab. ng/mL 390.0  HCT     37.5 - 51.0 % 40.8  Folate, RBC     >498 ng/mL 956  Vitamin B12     180 - 914 pg/mL 882   03/01/18 CBC w/diff:   RADIOGRAPHIC STUDIES: I have personally reviewed the radiological images as listed and agreed with the findings in the report. No results found.  03/05/20 of Surgical Pathology ([id number]) SURGICAL PATHOLOGY  CASE: [id number]  PATIENT: [name]  Surgical Pathology Report      Clinical History: Left testicular mass (jmc)      FINAL MICROSCOPIC DIAGNOSIS:   A. TESTICLE AND CORD, LEFT, ORCHIECTOMY:  - Diffuse large B-cell lymphoma  -See comment    COMMENT:   The sections of the testis and spermatic cord nodules show effacement of  the architecture by an atypical lymphoid infiltrate characterized by  predominance of medium and large lymphoid cells with vesicular chromatin  and small nucleoli associated with brisk mitosis. The appearance is  primarily diffuse with lack of atypical follicles. Variable number of  admixed smaller lymphocytes are seen. To further evaluate this process,  flow cytometric analysis was performed ([id number]) and shows a  monoclonal kappa-restricted  B-cell population. In addition, a battery  of immunohistochemical stains was performed and shows that the atypical  lymphoid cells are positive for CD20, CD79a, PAX 5, CD10 (weak), BCL-2,  MUM-1 and cytoplasmic kappa. No significant staining is seen with  CD138, cyclin D1,  CD30, CD34, TdT, EBV in situ hybridization or  cytoplasmic lambda. Ki67 shows variable expression ranging from 10% to  >50%. There is an admixed T-cell population to a lesser extent as seen  with CD3 and CD5 and there is no apparent co-expression of CD5 in B-cell  areas. The overall features are consistent with involvement by diffuse  large B-cell lymphoma, GCB type. The results were discussed with Dr.  Jeffie Pollock and Irene Limbo on 03/11/2020.    ASSESSMENT & PLAN:   79 y.o. male with  1. Ovoid lesion near the medial aspect of the left knee ? Injection granuloma vs other etiology for lesion. however given previous h/o melanoma and lymphoma will need to be cautious and try to wotrk this up.  04/09/18 MRI of Left Knee revealed An ovoid, lobulated, 83m T2 hyperintense focus within the medial subcutaneous far 1.911mdistal to the joint demonstrates demonstrates nonspecific imaging characteristics.   05/17/18 [place] Soft Tissue Biopsy of the Left Knee did not identify any malignant cells and revealed inflammatory cells.  2. MGUS- IgM kappa monoclonal paraproteinemia 05/02/18 MMP revealed M Protein at 0.5 with immunofixation revealing IgM with kappa light chains  3/ h/o Melanoma   4. H/o Rt Primary testicular large B cell lymphoma in 2000 treatment with R-CHOP x 6 cycles as part of clinical trial + IT MTX x 4 cycles and RT to left testicle  5. 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 Appears to be a new event since it is occurring 20 yrs after his previous rt testicular lymphoma  PLAN: -Discussed pt labwork today, 05/06/20; blood counts are stable, blood chemistries are nml, LDH is WNL -The pt has no prohibitive toxicities from continuing C3 R-CEOP at this time. Will continue current doses.  -Advised pt that we would complete at least 4 cycles, but would try to complete up to 6 cycles.  -Advised pt that there will be some anemia caused chemotherapy, but that it  is temporary.  -Advised pt that chemotherapy causes immunosuppression. This makes traveling a higher risk proposition from an infection standpoint esp in the pandemic. -Advised pt that chemotherapy does affect the effectiveness of vaccines, but that the COVID19 vaccine will still be hopefully somewhat preventative against infection.  -Will see back in 3 weeks with labs    FOLLOW UP: Plz schedule C4 as ordered (3 days of treatment + Udenyca) Portflush and labs on C4D1 MD visit on C4D1   The total time spent in the appt was 30 minutes and more than 50% was on counseling and direct patient cares, ordering and management of chemotherapy  All of the patient's questions were answered with apparent satisfaction. The patient knows to call the clinic with any problems, questions or concerns.    GaSullivan LoneD MSManley Hot SpringsAHIVMS SCEye Surgery Center Of Knoxville LLCTRegenerative Orthopaedics Surgery Center LLCematology/Oncology Physician CoFrio Regional Hospital(Office):       [phone] cell):  [phone]):           [phone]/21/2021 10:00 AM  I, JaYevette Edwardsam acting as a scribe for Dr. GaSullivan Lone  .I have reviewed the above documentation for accuracy and completeness, and I agree with the above. .GBrunetta GeneraD

## 2020-05-06 ENCOUNTER — Other Ambulatory Visit: Payer: Self-pay

## 2020-05-06 ENCOUNTER — Inpatient Hospital Stay: Payer: PPO | Admitting: Hematology

## 2020-05-06 ENCOUNTER — Inpatient Hospital Stay: Payer: PPO

## 2020-05-06 VITALS — BP 136/77 | HR 81 | Temp 97.7°F | Resp 18 | Ht 72.0 in | Wt 192.5 lb

## 2020-05-06 VITALS — BP 135/78 | HR 79 | Temp 98.0°F | Resp 18

## 2020-05-06 DIAGNOSIS — C8339 Diffuse large B-cell lymphoma, extranodal and solid organ sites: Secondary | ICD-10-CM

## 2020-05-06 DIAGNOSIS — Z5111 Encounter for antineoplastic chemotherapy: Secondary | ICD-10-CM | POA: Diagnosis not present

## 2020-05-06 DIAGNOSIS — D4959 Neoplasm of unspecified behavior of other genitourinary organ: Secondary | ICD-10-CM

## 2020-05-06 DIAGNOSIS — Z95828 Presence of other vascular implants and grafts: Secondary | ICD-10-CM

## 2020-05-06 DIAGNOSIS — Z7189 Other specified counseling: Secondary | ICD-10-CM

## 2020-05-06 DIAGNOSIS — C6210 Malignant neoplasm of unspecified descended testis: Secondary | ICD-10-CM

## 2020-05-06 LAB — LACTATE DEHYDROGENASE: LDH: 178 U/L (ref 98–192)

## 2020-05-06 LAB — CBC WITH DIFFERENTIAL/PLATELET
Abs Immature Granulocytes: 0.29 10*3/uL — ABNORMAL HIGH (ref 0.00–0.07)
Basophils Absolute: 0.1 10*3/uL (ref 0.0–0.1)
Basophils Relative: 1 %
Eosinophils Absolute: 0.1 10*3/uL (ref 0.0–0.5)
Eosinophils Relative: 1 %
HCT: 37.5 % — ABNORMAL LOW (ref 39.0–52.0)
Hemoglobin: 12.2 g/dL — ABNORMAL LOW (ref 13.0–17.0)
Immature Granulocytes: 4 %
Lymphocytes Relative: 11 %
Lymphs Abs: 0.9 10*3/uL (ref 0.7–4.0)
MCH: 33.7 pg (ref 26.0–34.0)
MCHC: 32.5 g/dL (ref 30.0–36.0)
MCV: 103.6 fL — ABNORMAL HIGH (ref 80.0–100.0)
Monocytes Absolute: 0.9 10*3/uL (ref 0.1–1.0)
Monocytes Relative: 10 %
Neutro Abs: 6.1 10*3/uL (ref 1.7–7.7)
Neutrophils Relative %: 73 %
Platelets: 214 10*3/uL (ref 150–400)
RBC: 3.62 MIL/uL — ABNORMAL LOW (ref 4.22–5.81)
RDW: 15.1 % (ref 11.5–15.5)
WBC: 8.3 10*3/uL (ref 4.0–10.5)
nRBC: 0 % (ref 0.0–0.2)

## 2020-05-06 LAB — CMP (CANCER CENTER ONLY)
ALT: 25 U/L (ref 0–44)
AST: 22 U/L (ref 15–41)
Albumin: 3.8 g/dL (ref 3.5–5.0)
Alkaline Phosphatase: 109 U/L (ref 38–126)
Anion gap: 8 (ref 5–15)
BUN: 16 mg/dL (ref 8–23)
CO2: 26 mmol/L (ref 22–32)
Calcium: 9.3 mg/dL (ref 8.9–10.3)
Chloride: 108 mmol/L (ref 98–111)
Creatinine: 0.83 mg/dL (ref 0.61–1.24)
GFR, Est AFR Am: >60 mL/min (ref >60)
GFR, Estimated: >60 mL/min (ref >60)
Glucose, Bld: 137 mg/dL — ABNORMAL HIGH (ref 70–99)
Potassium: 4 mmol/L (ref 3.5–5.1)
Sodium: 142 mmol/L (ref 135–145)
Total Bilirubin: 0.5 mg/dL (ref 0.3–1.2)
Total Protein: 6.7 g/dL (ref 6.5–8.1)

## 2020-05-06 MED ORDER — SODIUM CHLORIDE 0.9 % IV SOLN
500.0000 mg/m2 | Freq: Once | INTRAVENOUS | Status: AC
  Administered 2020-05-06: 1060 mg via INTRAVENOUS
  Filled 2020-05-06: qty 53

## 2020-05-06 MED ORDER — DIPHENHYDRAMINE HCL 25 MG PO CAPS
50.0000 mg | ORAL_CAPSULE | Freq: Once | ORAL | Status: AC
  Administered 2020-05-06: 50 mg via ORAL

## 2020-05-06 MED ORDER — SODIUM CHLORIDE 0.9 % IV SOLN
375.0000 mg/m2 | Freq: Once | INTRAVENOUS | Status: AC
  Administered 2020-05-06: 800 mg via INTRAVENOUS
  Filled 2020-05-06: qty 50

## 2020-05-06 MED ORDER — SODIUM CHLORIDE 0.9 % IV SOLN
10.0000 mg | Freq: Once | INTRAVENOUS | Status: AC
  Administered 2020-05-06: 10 mg via INTRAVENOUS
  Filled 2020-05-06: qty 10

## 2020-05-06 MED ORDER — ACETAMINOPHEN 325 MG PO TABS
ORAL_TABLET | ORAL | Status: AC
  Filled 2020-05-06: qty 2

## 2020-05-06 MED ORDER — SODIUM CHLORIDE 0.9 % IV SOLN
Freq: Once | INTRAVENOUS | Status: AC
  Filled 2020-05-06: qty 250

## 2020-05-06 MED ORDER — VINCRISTINE SULFATE CHEMO INJECTION 1 MG/ML
1.0000 mg | Freq: Once | INTRAVENOUS | Status: AC
  Administered 2020-05-06: 1 mg via INTRAVENOUS
  Filled 2020-05-06: qty 1

## 2020-05-06 MED ORDER — PALONOSETRON HCL INJECTION 0.25 MG/5ML
INTRAVENOUS | Status: AC
  Filled 2020-05-06: qty 5

## 2020-05-06 MED ORDER — SODIUM CHLORIDE 0.9% FLUSH
10.0000 mL | INTRAVENOUS | Status: DC | PRN
  Administered 2020-05-06: 10 mL
  Filled 2020-05-06: qty 10

## 2020-05-06 MED ORDER — PALONOSETRON HCL INJECTION 0.25 MG/5ML
0.2500 mg | Freq: Once | INTRAVENOUS | Status: AC
  Administered 2020-05-06: 0.25 mg via INTRAVENOUS

## 2020-05-06 MED ORDER — HEPARIN SOD (PORK) LOCK FLUSH 100 UNIT/ML IV SOLN
500.0000 [IU] | Freq: Once | INTRAVENOUS | Status: AC | PRN
  Administered 2020-05-06: 500 [IU]
  Filled 2020-05-06: qty 5

## 2020-05-06 MED ORDER — ACETAMINOPHEN 325 MG PO TABS
650.0000 mg | ORAL_TABLET | Freq: Once | ORAL | Status: AC
  Administered 2020-05-06: 650 mg via ORAL

## 2020-05-06 MED ORDER — SODIUM CHLORIDE 0.9 % IV SOLN
75.0000 mg/m2 | Freq: Once | INTRAVENOUS | Status: AC
  Administered 2020-05-06: 160 mg via INTRAVENOUS
  Filled 2020-05-06: qty 8

## 2020-05-06 MED ORDER — DIPHENHYDRAMINE HCL 25 MG PO CAPS
ORAL_CAPSULE | ORAL | Status: AC
  Filled 2020-05-06: qty 2

## 2020-05-06 NOTE — Patient Instructions (Signed)
[facility] Discharge Instructions for Patients Receiving Chemotherapy  Today you received the following chemotherapy agents Vincristine (ONCOVIN), Cyclophosphamide (CYTOXAN), Etoposide (VEPESID) & Rituximab-pvvr (RUXIENCE).  To help prevent nausea and vomiting after your treatment, we encourage you to take your nausea medication as prescribed.   If you develop nausea and vomiting that is not controlled by your nausea medication, call the clinic.   BELOW ARE SYMPTOMS THAT SHOULD BE REPORTED IMMEDIATELY:  *FEVER GREATER THAN 100.5 F  *CHILLS WITH OR WITHOUT FEVER  NAUSEA AND VOMITING THAT IS NOT CONTROLLED WITH YOUR NAUSEA MEDICATION  *UNUSUAL SHORTNESS OF BREATH  *UNUSUAL BRUISING OR BLEEDING  TENDERNESS IN MOUTH AND THROAT WITH OR WITHOUT PRESENCE OF ULCERS  *URINARY PROBLEMS  *BOWEL PROBLEMS  UNUSUAL RASH Items with * indicate a potential emergency and should be followed up as soon as possible.  Feel free to call the clinic should you have any questions or concerns. The clinic phone number is [phone].  Please show the CHEMO ALERT CARD at check-in to the Emergency Department and triage nurse.   

## 2020-05-06 NOTE — Progress Notes (Signed)
Rapid Infusion Rituximab Pharmacist Evaluation  Antonio [name] is a 79 y.o. male being treated with rituximab for DLBCL. This patient may be considered for RIR.   A pharmacist has verified the patient tolerated rituximab infusions per the [facility] standard infusion protocol without grade 3-4 infusion reactions. The treatment plan will be updated to reflect RIR if the patient qualifies per the checklist below:   Age > 59 years old Yes   Clinically significant cardiovascular disease No   Circulating lymphocyte count < 5000/uL prior to cycle two Yes  Lab Results  Component Value Date   LYMPHSABS 0.9 05/06/2020    Prior documented grade 3-4 infusion reaction to rituximab No   Prior documented grade 1-2 infusion reaction to rituximab (If YES, Pharmacist will confirm with Physician if patient is still a candidate for RIR) Yes   Previous rituximab infusion within the past 6 months Yes   Treatment Plan updated orders to reflect RIR No    Antonio [name] does not meet the criteria for Rapid Infusion Rituximab. This patient is not going to be switched to rapid infusion rituximab.    Antonio [name], Pharm.D., CPP 05/06/2020@11 :22 AM

## 2020-05-07 ENCOUNTER — Inpatient Hospital Stay: Payer: PPO

## 2020-05-07 ENCOUNTER — Telehealth: Payer: Self-pay | Admitting: Hematology

## 2020-05-07 ENCOUNTER — Other Ambulatory Visit: Payer: Self-pay

## 2020-05-07 VITALS — BP 136/82 | HR 84 | Temp 98.5°F | Resp 18

## 2020-05-07 DIAGNOSIS — Z7189 Other specified counseling: Secondary | ICD-10-CM

## 2020-05-07 DIAGNOSIS — C6210 Malignant neoplasm of unspecified descended testis: Secondary | ICD-10-CM

## 2020-05-07 DIAGNOSIS — Z5111 Encounter for antineoplastic chemotherapy: Secondary | ICD-10-CM | POA: Diagnosis not present

## 2020-05-07 DIAGNOSIS — C8339 Diffuse large B-cell lymphoma, extranodal and solid organ sites: Secondary | ICD-10-CM

## 2020-05-07 MED ORDER — SODIUM CHLORIDE 0.9 % IV SOLN
Freq: Once | INTRAVENOUS | Status: AC
  Filled 2020-05-07: qty 250

## 2020-05-07 MED ORDER — SODIUM CHLORIDE 0.9 % IV SOLN
75.0000 mg/m2 | Freq: Once | INTRAVENOUS | Status: AC
  Administered 2020-05-07: 160 mg via INTRAVENOUS
  Filled 2020-05-07: qty 8

## 2020-05-07 MED ORDER — HEPARIN SOD (PORK) LOCK FLUSH 100 UNIT/ML IV SOLN
500.0000 [IU] | Freq: Once | INTRAVENOUS | Status: AC | PRN
  Administered 2020-05-07: 500 [IU]
  Filled 2020-05-07: qty 5

## 2020-05-07 MED ORDER — PROCHLORPERAZINE MALEATE 10 MG PO TABS
10.0000 mg | ORAL_TABLET | Freq: Once | ORAL | Status: AC
  Administered 2020-05-07: 10 mg via ORAL

## 2020-05-07 MED ORDER — PROCHLORPERAZINE MALEATE 10 MG PO TABS
ORAL_TABLET | ORAL | Status: AC
  Filled 2020-05-07: qty 1

## 2020-05-07 MED ORDER — SODIUM CHLORIDE 0.9% FLUSH
10.0000 mL | INTRAVENOUS | Status: DC | PRN
  Administered 2020-05-07: 10 mL
  Filled 2020-05-07: qty 10

## 2020-05-07 NOTE — Patient Instructions (Signed)
[facility] Discharge Instructions for Patients Receiving Chemotherapy  Today you received the following chemotherapy agents: Etoposide   If you develop nausea and vomiting that is not controlled by your nausea medication, call the clinic.   BELOW ARE SYMPTOMS THAT SHOULD BE REPORTED IMMEDIATELY:  *FEVER GREATER THAN 100.5 F  *CHILLS WITH OR WITHOUT FEVER  NAUSEA AND VOMITING THAT IS NOT CONTROLLED WITH YOUR NAUSEA MEDICATION  *UNUSUAL SHORTNESS OF BREATH  *UNUSUAL BRUISING OR BLEEDING  TENDERNESS IN MOUTH AND THROAT WITH OR WITHOUT PRESENCE OF ULCERS  *URINARY PROBLEMS  *BOWEL PROBLEMS  UNUSUAL RASH Items with * indicate a potential emergency and should be followed up as soon as possible.  Feel free to call the clinic should you have any questions or concerns. The clinic phone number is [phone].  Please show the CHEMO ALERT CARD at check-in to the Emergency Department and triage nurse.     

## 2020-05-07 NOTE — Telephone Encounter (Signed)
Scheduled per 07/20 los, patient has received updated calender.  

## 2020-05-08 ENCOUNTER — Other Ambulatory Visit: Payer: Self-pay

## 2020-05-08 ENCOUNTER — Inpatient Hospital Stay: Payer: PPO

## 2020-05-08 VITALS — BP 134/73 | HR 92 | Temp 98.4°F | Resp 16

## 2020-05-08 DIAGNOSIS — C8339 Diffuse large B-cell lymphoma, extranodal and solid organ sites: Secondary | ICD-10-CM

## 2020-05-08 DIAGNOSIS — C6210 Malignant neoplasm of unspecified descended testis: Secondary | ICD-10-CM

## 2020-05-08 DIAGNOSIS — Z5111 Encounter for antineoplastic chemotherapy: Secondary | ICD-10-CM | POA: Diagnosis not present

## 2020-05-08 DIAGNOSIS — Z7189 Other specified counseling: Secondary | ICD-10-CM

## 2020-05-08 MED ORDER — SODIUM CHLORIDE 0.9 % IV SOLN
Freq: Once | INTRAVENOUS | Status: AC
  Filled 2020-05-08: qty 250

## 2020-05-08 MED ORDER — PEGFILGRASTIM 6 MG/0.6ML ~~LOC~~ PSKT
PREFILLED_SYRINGE | SUBCUTANEOUS | Status: AC
  Filled 2020-05-08: qty 0.6

## 2020-05-08 MED ORDER — PEGFILGRASTIM 6 MG/0.6ML ~~LOC~~ PSKT
6.0000 mg | PREFILLED_SYRINGE | Freq: Once | SUBCUTANEOUS | Status: AC
  Administered 2020-05-08: 6 mg via SUBCUTANEOUS

## 2020-05-08 MED ORDER — SODIUM CHLORIDE 0.9% FLUSH
10.0000 mL | INTRAVENOUS | Status: DC | PRN
  Administered 2020-05-08: 10 mL
  Filled 2020-05-08: qty 10

## 2020-05-08 MED ORDER — PROCHLORPERAZINE MALEATE 10 MG PO TABS
ORAL_TABLET | ORAL | Status: AC
  Filled 2020-05-08: qty 1

## 2020-05-08 MED ORDER — HEPARIN SOD (PORK) LOCK FLUSH 100 UNIT/ML IV SOLN
500.0000 [IU] | Freq: Once | INTRAVENOUS | Status: AC | PRN
  Administered 2020-05-08: 500 [IU]
  Filled 2020-05-08: qty 5

## 2020-05-08 MED ORDER — PROCHLORPERAZINE MALEATE 10 MG PO TABS
10.0000 mg | ORAL_TABLET | Freq: Once | ORAL | Status: AC
  Administered 2020-05-08: 10 mg via ORAL

## 2020-05-08 MED ORDER — SODIUM CHLORIDE 0.9 % IV SOLN
75.0000 mg/m2 | Freq: Once | INTRAVENOUS | Status: AC
  Administered 2020-05-08: 160 mg via INTRAVENOUS
  Filled 2020-05-08: qty 8

## 2020-05-08 NOTE — Patient Instructions (Signed)
[facility] Discharge Instructions for Patients Receiving Chemotherapy  Today you received the following chemotherapy agents: Etoposide.  To help prevent nausea and vomiting after your treatment, we encourage you to take your nausea medication as prescribed. If you develop nausea and vomiting that is not controlled by your nausea medication, call the clinic.   BELOW ARE SYMPTOMS THAT SHOULD BE REPORTED IMMEDIATELY:  *FEVER GREATER THAN 100.5 F  *CHILLS WITH OR WITHOUT FEVER  NAUSEA AND VOMITING THAT IS NOT CONTROLLED WITH YOUR NAUSEA MEDICATION  *UNUSUAL SHORTNESS OF BREATH  *UNUSUAL BRUISING OR BLEEDING  TENDERNESS IN MOUTH AND THROAT WITH OR WITHOUT PRESENCE OF ULCERS  *URINARY PROBLEMS  *BOWEL PROBLEMS  UNUSUAL RASH Items with * indicate a potential emergency and should be followed up as soon as possible.  Feel free to call the clinic should you have any questions or concerns. The clinic phone number is [phone].  Please show the CHEMO ALERT CARD at check-in to the Emergency Department and triage nurse.   

## 2020-05-12 ENCOUNTER — Encounter (INDEPENDENT_AMBULATORY_CARE_PROVIDER_SITE_OTHER): Payer: PPO | Admitting: Ophthalmology

## 2020-05-25 ENCOUNTER — Telehealth: Payer: Self-pay | Admitting: Hematology

## 2020-05-25 NOTE — Telephone Encounter (Signed)
Returned patient's phone call regarding upcoming appointments, left a voicemail.

## 2020-05-26 ENCOUNTER — Other Ambulatory Visit: Payer: PPO

## 2020-05-26 ENCOUNTER — Ambulatory Visit: Payer: PPO

## 2020-05-27 ENCOUNTER — Inpatient Hospital Stay (HOSPITAL_BASED_OUTPATIENT_CLINIC_OR_DEPARTMENT_OTHER): Payer: PPO | Admitting: Hematology

## 2020-05-27 ENCOUNTER — Other Ambulatory Visit: Payer: Self-pay

## 2020-05-27 ENCOUNTER — Encounter (INDEPENDENT_AMBULATORY_CARE_PROVIDER_SITE_OTHER): Payer: PPO | Admitting: Ophthalmology

## 2020-05-27 ENCOUNTER — Ambulatory Visit: Payer: PPO

## 2020-05-27 ENCOUNTER — Inpatient Hospital Stay: Payer: PPO

## 2020-05-27 ENCOUNTER — Inpatient Hospital Stay: Payer: PPO | Attending: Hematology

## 2020-05-27 VITALS — BP 143/97 | HR 83 | Temp 98.8°F | Resp 20 | Ht 72.0 in | Wt 194.7 lb

## 2020-05-27 VITALS — BP 133/81 | HR 73 | Temp 97.6°F | Resp 18

## 2020-05-27 DIAGNOSIS — Z5189 Encounter for other specified aftercare: Secondary | ICD-10-CM | POA: Diagnosis not present

## 2020-05-27 DIAGNOSIS — C6210 Malignant neoplasm of unspecified descended testis: Secondary | ICD-10-CM

## 2020-05-27 DIAGNOSIS — Z5111 Encounter for antineoplastic chemotherapy: Secondary | ICD-10-CM | POA: Diagnosis not present

## 2020-05-27 DIAGNOSIS — C8519 Unspecified B-cell lymphoma, extranodal and solid organ sites: Secondary | ICD-10-CM | POA: Diagnosis not present

## 2020-05-27 DIAGNOSIS — Z7189 Other specified counseling: Secondary | ICD-10-CM

## 2020-05-27 DIAGNOSIS — C8339 Diffuse large B-cell lymphoma, extranodal and solid organ sites: Secondary | ICD-10-CM

## 2020-05-27 DIAGNOSIS — Z79899 Other long term (current) drug therapy: Secondary | ICD-10-CM | POA: Diagnosis not present

## 2020-05-27 DIAGNOSIS — Z5112 Encounter for antineoplastic immunotherapy: Secondary | ICD-10-CM | POA: Insufficient documentation

## 2020-05-27 DIAGNOSIS — C83398 Diffuse large b-cell lymphoma of other extranodal and solid organ sites: Secondary | ICD-10-CM

## 2020-05-27 LAB — CMP (CANCER CENTER ONLY)
ALT: 25 U/L (ref 0–44)
AST: 19 U/L (ref 15–41)
Albumin: 3.8 g/dL (ref 3.5–5.0)
Alkaline Phosphatase: 92 U/L (ref 38–126)
Anion gap: 7 (ref 5–15)
BUN: 21 mg/dL (ref 8–23)
CO2: 24 mmol/L (ref 22–32)
Calcium: 9.4 mg/dL (ref 8.9–10.3)
Chloride: 110 mmol/L (ref 98–111)
Creatinine: 0.76 mg/dL (ref 0.61–1.24)
GFR, Est AFR Am: >60 mL/min (ref >60)
GFR, Estimated: >60 mL/min (ref >60)
Glucose, Bld: 109 mg/dL — ABNORMAL HIGH (ref 70–99)
Potassium: 4.7 mmol/L (ref 3.5–5.1)
Sodium: 141 mmol/L (ref 135–145)
Total Bilirubin: 0.5 mg/dL (ref 0.3–1.2)
Total Protein: 6.7 g/dL (ref 6.5–8.1)

## 2020-05-27 LAB — CBC WITH DIFFERENTIAL/PLATELET
Abs Immature Granulocytes: 0.27 10*3/uL — ABNORMAL HIGH (ref 0.00–0.07)
Basophils Absolute: 0.1 10*3/uL (ref 0.0–0.1)
Basophils Relative: 1 %
Eosinophils Absolute: 0.1 10*3/uL (ref 0.0–0.5)
Eosinophils Relative: 1 %
HCT: 36.9 % — ABNORMAL LOW (ref 39.0–52.0)
Hemoglobin: 12.1 g/dL — ABNORMAL LOW (ref 13.0–17.0)
Immature Granulocytes: 3 %
Lymphocytes Relative: 6 %
Lymphs Abs: 0.6 10*3/uL — ABNORMAL LOW (ref 0.7–4.0)
MCH: 34.4 pg — ABNORMAL HIGH (ref 26.0–34.0)
MCHC: 32.8 g/dL (ref 30.0–36.0)
MCV: 104.8 fL — ABNORMAL HIGH (ref 80.0–100.0)
Monocytes Absolute: 0.4 10*3/uL (ref 0.1–1.0)
Monocytes Relative: 4 %
Neutro Abs: 9.4 10*3/uL — ABNORMAL HIGH (ref 1.7–7.7)
Neutrophils Relative %: 85 %
Platelets: 220 10*3/uL (ref 150–400)
RBC: 3.52 MIL/uL — ABNORMAL LOW (ref 4.22–5.81)
RDW: 15.8 % — ABNORMAL HIGH (ref 11.5–15.5)
WBC: 10.9 10*3/uL — ABNORMAL HIGH (ref 4.0–10.5)
nRBC: 0 % (ref 0.0–0.2)

## 2020-05-27 MED ORDER — SODIUM CHLORIDE 0.9% FLUSH
10.0000 mL | INTRAVENOUS | Status: DC | PRN
  Filled 2020-05-27: qty 10

## 2020-05-27 MED ORDER — DIPHENHYDRAMINE HCL 25 MG PO CAPS
50.0000 mg | ORAL_CAPSULE | Freq: Once | ORAL | Status: AC
  Administered 2020-05-27: 50 mg via ORAL

## 2020-05-27 MED ORDER — SODIUM CHLORIDE 0.9 % IV SOLN
500.0000 mg/m2 | Freq: Once | INTRAVENOUS | Status: AC
  Administered 2020-05-27: 1060 mg via INTRAVENOUS
  Filled 2020-05-27: qty 53

## 2020-05-27 MED ORDER — SODIUM CHLORIDE 0.9 % IV SOLN
375.0000 mg/m2 | Freq: Once | INTRAVENOUS | Status: AC
  Administered 2020-05-27: 800 mg via INTRAVENOUS
  Filled 2020-05-27: qty 30

## 2020-05-27 MED ORDER — SODIUM CHLORIDE 0.9% FLUSH
10.0000 mL | INTRAVENOUS | Status: DC | PRN
  Administered 2020-05-27: 10 mL
  Filled 2020-05-27: qty 10

## 2020-05-27 MED ORDER — SODIUM CHLORIDE 0.9 % IV SOLN
10.0000 mg | Freq: Once | INTRAVENOUS | Status: AC
  Administered 2020-05-27: 10 mg via INTRAVENOUS
  Filled 2020-05-27: qty 10

## 2020-05-27 MED ORDER — VINCRISTINE SULFATE CHEMO INJECTION 1 MG/ML
1.0000 mg | Freq: Once | INTRAVENOUS | Status: AC
  Administered 2020-05-27: 1 mg via INTRAVENOUS
  Filled 2020-05-27: qty 1

## 2020-05-27 MED ORDER — ACETAMINOPHEN 325 MG PO TABS
ORAL_TABLET | ORAL | Status: AC
  Filled 2020-05-27: qty 2

## 2020-05-27 MED ORDER — ACETAMINOPHEN 325 MG PO TABS
650.0000 mg | ORAL_TABLET | Freq: Once | ORAL | Status: AC
  Administered 2020-05-27: 650 mg via ORAL

## 2020-05-27 MED ORDER — HEPARIN SOD (PORK) LOCK FLUSH 100 UNIT/ML IV SOLN
500.0000 [IU] | Freq: Once | INTRAVENOUS | Status: DC | PRN
  Filled 2020-05-27: qty 5

## 2020-05-27 MED ORDER — PALONOSETRON HCL INJECTION 0.25 MG/5ML
INTRAVENOUS | Status: AC
  Filled 2020-05-27: qty 5

## 2020-05-27 MED ORDER — SODIUM CHLORIDE 0.9 % IV SOLN
Freq: Once | INTRAVENOUS | Status: AC
  Filled 2020-05-27: qty 250

## 2020-05-27 MED ORDER — SODIUM CHLORIDE 0.9 % IV SOLN
75.0000 mg/m2 | Freq: Once | INTRAVENOUS | Status: AC
  Administered 2020-05-27: 160 mg via INTRAVENOUS
  Filled 2020-05-27: qty 8

## 2020-05-27 MED ORDER — HEPARIN SOD (PORK) LOCK FLUSH 100 UNIT/ML IV SOLN
500.0000 [IU] | Freq: Once | INTRAVENOUS | Status: AC | PRN
  Administered 2020-05-27: 500 [IU]
  Filled 2020-05-27: qty 5

## 2020-05-27 MED ORDER — PALONOSETRON HCL INJECTION 0.25 MG/5ML
0.2500 mg | Freq: Once | INTRAVENOUS | Status: AC
  Administered 2020-05-27: 0.25 mg via INTRAVENOUS

## 2020-05-27 MED ORDER — DIPHENHYDRAMINE HCL 25 MG PO CAPS
ORAL_CAPSULE | ORAL | Status: AC
  Filled 2020-05-27: qty 2

## 2020-05-27 NOTE — Progress Notes (Signed)
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 male who has been referred to [place] by Dr. Hollace Kinnier for evaluation and management of Bone lesion of left lower leg. The pt reports that he is doing well overall.   The pt reports that he cannot feel the ovoid finding from his 04/09/18 MRI as noted below. He denies being able to feel anything in his skin as well. He notes that he received a needle injection in his knee prior to this MRI. The pt notes that his knee has lost much cartilage and is considering a knee replacement. He denies any recent injuries to his knee or falls. He has contacted Dr. Alphonsa Overall in surgery and another surgeon as well and will be making an appointment.   The pt notes that he does not feel any differently recently as compared to 6 months to a year ago. He does note that he normally has constipation in the spring, and has continued to have mild constipation. He denies any other concerns or symptoms.   The pt previously saw my colleague Dr. Lurline Del for primary testicular B-cell lymphoma in 2000. The pt notes that the involvement was limited to only one testicle. He received 6 intrathecal treatments in his spine, and received 4 cycles of chemotherapy (thinks this was CHOP) and radiation as well. The pt was followed by an oncologist for several years at the [facility] in [place] after moving from [place] after his [place] treatment.   The pt notes that he was first diagnosed with melanoma in October 2000. He noticed a strange spot on his left leg that was surgically resected. The pt's personal notes report a 2.9cm thick, level 4, no LN involvement, high risk stage  II, treated with Interferon three times a week for one year. He notes some squamous cell and basal cell involvements as well and sees a dermatologist, Dr. Wilhemina Bonito, regularly.   Of note prior to the patient's visit today, pt has had MRI Left Knee completed on 04/09/18 with results revealing An ovoid, lobulated, 75m T2 hyperintense focus within the medial subcutaneous far 1.935mdistal to the joint demonstrates demonstrates nonspecific imaging characteristics.   Most recent lab results (03/01/18) of CBC w/diff is as follows: all values are WNL except for RBC at 4.54, MCV at 100.4, MCH at 33.8.   On review of systems, pt reports left knee pain, mild constipation, and denies new or concerning symptoms, noticing any new lumps or bumps, pain along the spine, abdominal pains, problems passing urine, testicular pain/swelling, and any other symptoms.   Interval History:   Antonio DAULTeturns today for management and evaluation evaluation of new testicular mass. He is here prior to C4D1 R-CEOP. The patient's last visit with [place] on 05/06/2020. The pt reports that he is doing well overall.  The pt reports that he is still experiencing fatigue, but that it is manageable. Pt has a mouth sore that is nearly resolved. He denies any other new symptoms or infections.   He continues to be on Testosterone replacement and follow with Dr. WrJeffie Pollock  Lab results today (05/27/20) of CBC w/diff and CMP is as follows: all values are WNL  except for WBC at 10.9K, RBC at 3.52, Hgb at 12.1, HCT at 36.9, MCV at 104.8, MCH at 34.4, RDW at at 15.8, Neutro Abs at 9.4K, Lymphs Abs at 0.6K, Abs Immature Granulocytes at 0.27K, Glucose at 109.  On review of systems, pt reports fatigue, mouth sores and denies fevers, chills, constipation, leg swelling, headaches, pain at port site, hot flashes, muscle pain and any other symptoms.   MEDICAL HISTORY:  Past Medical History:  Diagnosis Date  . Age-related macular degeneration, dry,  both eyes   . 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 Carotid stenosis, asymptomatic, left ?  . ED (erectile dysfunction)   . Elevated diaphragm    right  . Erectile dysfunction   . History of basal cell carcinoma (BCC) excision    multiple excision in office  . History of kidney stones   . History of seizure    03-03-2020 per pt x1 while taking interferan for melanoma in 2001 ,  none since, told a side effect of interferan  . History of squamous cell carcinoma excision    multiple skin excision in office  . Hyperlipidemia   . Hypertension    followed by pcp  (03-03-2020 per pt had a stress test approx. 2010, told normal)  . LBBB (left bundle branch block)   . Lymphoma of testis ([place]) 2000   s/p right orchiectomy   . Mass of left testis   . MGUS (monoclonal gammopathy of unknown significance)    oncologist--- dr Irene Limbo  . Personal history of malignant melanoma of skin dermatologist--- dr Wilhemina Bonito   dx 2001 left calf area  s/p WLE ,  completed one year interferan (03-03-2020 pt states did not involve lymph nodes, no recurrence)  . Urgency of urination   . Wears hearing aid in both ears     SURGICAL HISTORY: Past Surgical History:  Procedure Laterality Date  . CATARACT EXTRACTION W/ INTRAOCULAR LENS IMPLANT Bilateral 2018  . IR IMAGING GUIDED PORT INSERTION  03/31/2020  . MELANOMA EXCISION  10/1999  . ORCHIECTOMY Right 2000  . ORCHIECTOMY Left 03/05/2020   Procedure: ORCHIECTOMY;  Surgeon: Antonio Seal, MD;  Location: [facility];  Service: Urology;  Laterality: Left;  . TONSILLECTOMY  child  . TOTAL KNEE ARTHROPLASTY Right 06/2009    SOCIAL HISTORY: Social History   Socioeconomic History  . Marital status: Married    Spouse name: Not on file  . Number of children:  Not on file  . Years of education: Not on file  . Highest education level: Not on file  Occupational History  . Not on file  Tobacco Use  . Smoking status: Never Smoker  . Smokeless tobacco: Never Used  Vaping Use  . Vaping Use: Never used  Substance and Sexual Activity  . Alcohol use: Yes    Alcohol/week: 14.0 standard drinks    Types: 14 Glasses of wine per week    Comment: 2 wine daily  . Drug use: Never  . Sexual activity: Not on file  Other Topics Concern  . Not on file  Social History Narrative   Social History      Diet?good      Do you drink/eat things with caffeine? seldom      Marital status?  yes                          What year were you married? 1962      Do you live in a house, apartment, assisted living, condo, trailer, etc.? house      Is it one or more stories?1      How many persons live in your home? 2      Do you have any pets in your home? (please list) no      Highest level of education completed? college      Current or past profession: president and [employer]      Do you exercise?   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 yes      Do you have a DNR form?       yes                            If not, do you want to discuss one?      Do you have signed POA/HPOA for forms?  yes      Functional Status      Do you have difficulty bathing or dressing yourself? no      Do you have difficulty preparing food or eating? no      Do you have difficulty managing your medications? no      Do you have difficulty managing your finances? no      Do you have difficulty affording your medications? no   Social Determinants of Health   Financial Resource Strain:   . Difficulty of Paying Living Expenses:   Food Insecurity:   . Worried About Charity fundraiser in the Last Year:   . Arboriculturist in the Last Year:   Transportation Needs:    . Film/video editor (Medical):   Marland Kitchen Lack of Transportation (Non-Medical):   Physical Activity:   . Days of Exercise per Week:   . Minutes of Exercise per Session:   Stress:   . Feeling of Stress :   Social Connections:   . Frequency of Communication with Friends and Family:   . Frequency of Social Gatherings with Friends and Family:   . Attends Religious Services:   . Active Member of Clubs or Organizations:   . Attends Archivist Meetings:   Marland Kitchen Marital Status:   Intimate Partner Violence:   . Fear of Current or Ex-Partner:   . Emotionally Abused:   Marland Kitchen Physically Abused:   . Sexually Abused:     FAMILY HISTORY: Family History  Problem Relation Age of Onset  . Cataracts Mother   . Transient ischemic attack Mother   . Asthma Mother   . Cataracts Father   . Diabetes Father        borderline  . Hypertension Father   . Hyperlipidemia Father   . Hyperlipidemia Son   . Hypertension Son   . Amblyopia Neg Hx   . Blindness Neg Hx   . Glaucoma Neg Hx   . Macular degeneration Neg Hx   .  Retinal detachment Neg Hx   . Strabismus Neg Hx   . Retinitis pigmentosa Neg Hx     ALLERGIES:  is allergic to latex.  MEDICATIONS:  Current Outpatient Medications  Medication Sig Dispense Refill  . calcium carbonate (TUMS - DOSED IN MG ELEMENTAL CALCIUM) 500 MG chewable tablet Chew 1 tablet by mouth as needed for indigestion or heartburn.    . Cholecalciferol (VITAMIN D3) 2000 units TABS Take 1 tablet by mouth daily.    . Cyanocobalamin (B-12 PO) Take 1 tablet by mouth daily.    . diphenhydramine-acetaminophen (TYLENOL PM) 25-500 MG TABS tablet Take 1 tablet by mouth at bedtime as needed.    Marland Kitchen Ketotifen Fumarate (REFRESH EYE ITCH RELIEF OP) Place 1 drop into both eyes daily as needed (itchy eyes).     Marland Kitchen lidocaine-prilocaine (EMLA) cream     . losartan (COZAAR) 100 MG tablet Take 1 tablet (100 mg total) by mouth daily. 90 tablet 1  . meloxicam (MOBIC) 7.5 MG tablet TAKE 1  TABLET (7.5 MG TOTAL) BY MOUTH 2 (TWO) TIMES DAILY AS NEEDED FOR PAIN. (Patient taking differently: Take 7.5 mg by mouth daily. ) 180 tablet 0  . Multiple Vitamins-Minerals (PRESERVISION AREDS 2 PO) Take 1 capsule by mouth 2 (two) times daily.     . ondansetron (ZOFRAN) 8 MG tablet Take 1 tablet (8 mg total) by mouth every 8 (eight) hours as needed for nausea or vomiting. (Patient not taking: Reported on 03/30/2020) 30 tablet 0  . predniSONE (DELTASONE) 20 MG tablet Take 3 tablets (60 mg total) by mouth daily with breakfast. For 5 days with each cycle of chemotherapy 15 tablet 5  . prochlorperazine (COMPAZINE) 10 MG tablet Take 1 tablet (10 mg total) by mouth every 6 (six) hours as needed for nausea or vomiting. (Patient not taking: Reported on 03/30/2020) 30 tablet 0  . simvastatin (ZOCOR) 40 MG tablet TAKE 1 TABLET BY MOUTH EVERY DAY (Patient taking differently: Take 40 mg by mouth daily at 6 PM. ) 90 tablet 1  . Testosterone 20.25 MG/ACT (1.62%) GEL SMARTSIG:40.5 Milligram(s) Topical Every Morning    . valACYclovir (VALTREX) 1000 MG tablet Take one tablet by mouth twice daily for 3 days as needed for flares. 30 tablet 1   No current facility-administered medications for this visit.    REVIEW OF SYSTEMS:   A 10+ POINT REVIEW OF SYSTEMS WAS OBTAINED including neurology, dermatology, psychiatry, cardiac, respiratory, lymph, extremities, GI, GU, Musculoskeletal, constitutional, breasts, reproductive, HEENT.  All pertinent positives are noted in the HPI.  All others are negative.   PHYSICAL EXAMINATION: ECOG PERFORMANCE STATUS: 0 - Asymptomatic VS reviewed - stable  Vitals:   05/27/20 0954  BP: (!) 143/97  Pulse: 83  Resp: 20  Temp: 98.8 F (37.1 C)  SpO2: 97%   Wt Readings from Last 3 Encounters:  05/27/20 194 lb 11.2 oz (88.3 kg)  05/06/20 192 lb 8 oz (87.3 kg)  04/13/20 192 lb 4.8 oz (87.2 kg)   Body mass index is 26.41 kg/m.    GENERAL:alert, in no acute distress and  comfortable SKIN: no acute rashes, no significant lesions EYES: conjunctiva are pink and non-injected, sclera anicteric OROPHARYNX: MMM, no exudates, no oropharyngeal erythema or ulceration NECK: supple, no JVD LYMPH:  no palpable lymphadenopathy in the cervical, axillary or inguinal regions LUNGS: clear to auscultation b/l with normal respiratory effort HEART: regular rate & rhythm ABDOMEN:  normoactive bowel sounds , non tender, not distended. No palpable hepatosplenomegaly.  Extremity:  no pedal edema PSYCH: alert & oriented x 3 with fluent speech NEURO: no focal motor/sensory deficits  LABORATORY DATA:  I have reviewed the data as listed  . CBC Latest Ref Rng & Units 05/27/2020 05/06/2020 04/13/2020  WBC 4.0 - 10.5 K/uL 10.9(H) 8.3 12.9(H)  Hemoglobin 13.0 - 17.0 g/dL 12.1(L) 12.2(L) 12.1(L)  Hematocrit 39 - 52 % 36.9(L) 37.5(L) 37.6(L)  Platelets 150 - 400 K/uL 220 214 178   . CBC    Component Value Date/Time   WBC 10.9 (H) 05/27/2020 0930   RBC 3.52 (L) 05/27/2020 0930   HGB 12.1 (L) 05/27/2020 0930   HGB 11.9 (L) 04/01/2020 1111   HCT 36.9 (L) 05/27/2020 0930   HCT 40.8 12/19/2019 1057   PLT 220 05/27/2020 0930   PLT 159 04/01/2020 1111   MCV 104.8 (H) 05/27/2020 0930   MCH 34.4 (H) 05/27/2020 0930   MCHC 32.8 05/27/2020 0930   RDW 15.8 (H) 05/27/2020 0930   LYMPHSABS 0.6 (L) 05/27/2020 0930   MONOABS 0.4 05/27/2020 0930   EOSABS 0.1 05/27/2020 0930   BASOSABS 0.1 05/27/2020 0930    . CMP Latest Ref Rng & Units 05/27/2020 05/06/2020 04/13/2020  Glucose 70 - 99 mg/dL 109(H) 137(H) 111(H)  BUN 8 - 23 mg/dL '21 16 18  ' Creatinine 0.61 - 1.24 mg/dL 0.76 0.83 0.81  Sodium 135 - 145 mmol/L 141 142 144  Potassium 3.5 - 5.1 mmol/L 4.7 4.0 4.4  Chloride 98 - 111 mmol/L 110 108 109  CO2 22 - 32 mmol/L '24 26 25  ' Calcium 8.9 - 10.3 mg/dL 9.4 9.3 9.0  Total Protein 6.5 - 8.1 g/dL 6.7 6.7 6.4(L)  Total Bilirubin 0.3 - 1.2 mg/dL 0.5 0.5 0.3  Alkaline Phos 38 - 126 U/L 92 109  124  AST 15 - 41 U/L '19 22 23  ' ALT 0 - 44 U/L 25 25 32   03/05/2020 Left Testicular Flow Pathology Report [id number]):    03/05/2020 FISH Panel:    03/05/2020 Left Testicular Mass Surgical Pathology ([id number]):    Component     Latest Ref Rng & Units 12/19/2019  Folate, Hemolysate     Not Estab. ng/mL 390.0  HCT     37.5 - 51.0 % 40.8  Folate, RBC     >498 ng/mL 956  Vitamin B12     180 - 914 pg/mL 882   03/01/18 CBC w/diff:   RADIOGRAPHIC STUDIES: I have personally reviewed the radiological images as listed and agreed with the findings in the report. No results found.  03/05/20 of Surgical Pathology ([id number]) SURGICAL PATHOLOGY  CASE: [id number]  PATIENT: [name]  Surgical Pathology Report      Clinical History: Left testicular mass (jmc)      FINAL MICROSCOPIC DIAGNOSIS:   A. TESTICLE AND CORD, LEFT, ORCHIECTOMY:  - Diffuse large B-cell lymphoma  -See comment    COMMENT:   The sections of the testis and spermatic cord nodules show effacement of  the architecture by an atypical lymphoid infiltrate characterized by  predominance of medium and large lymphoid cells with vesicular chromatin  and small nucleoli associated with brisk mitosis. The appearance is  primarily diffuse with lack of atypical follicles. Variable number of  admixed smaller lymphocytes are seen. To further evaluate this process,  flow cytometric analysis was performed ([id number]) and shows a  monoclonal kappa-restricted B-cell population. In addition, a battery  of immunohistochemical stains was performed and shows that the atypical  lymphoid cells are positive for CD20,  CD79a, PAX 5, CD10 (weak), BCL-2,  MUM-1 and cytoplasmic kappa. No significant staining is seen with  CD138, cyclin D1, CD30, CD34, TdT, EBV in situ hybridization or  cytoplasmic lambda. Ki67 shows variable expression ranging from 10% to  >50%. There is an admixed T-cell population to a  lesser extent as seen  with CD3 and CD5 and there is no apparent co-expression of CD5 in B-cell  areas. The overall features are consistent with involvement by diffuse  large B-cell lymphoma, GCB type. The results were discussed with Dr.  Jeffie Pollock and Irene Limbo on 03/11/2020.    ASSESSMENT & PLAN:   79 y.o. male with  1. Ovoid lesion near the medial aspect of the left knee ? Injection granuloma vs other etiology for lesion. however given previous h/o melanoma and lymphoma will need to be cautious and try to wotrk this up.  04/09/18 MRI of Left Knee revealed An ovoid, lobulated, 37m T2 hyperintense focus within the medial subcutaneous far 1.963mdistal to the joint demonstrates demonstrates nonspecific imaging characteristics.   05/17/18 [place] Soft Tissue Biopsy of the Left Knee did not identify any malignant cells and revealed inflammatory cells.  2. MGUS- IgM kappa monoclonal paraproteinemia 05/02/18 MMP revealed M Protein at 0.5 with immunofixation revealing IgM with kappa light chains  3/ h/o Melanoma   4. H/o Rt Primary testicular large B cell lymphoma in 2000 treatment with R-CHOP x 6 cycles as part of clinical trial + IT MTX x 4 cycles and RT to left testicle  5. 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 Appears to be a new event since it is occurring 20 yrs after his previous rt testicular lymphoma  PLAN: -Discussed pt labwork today, 05/27/20; blood counts are okay, blood chemistries are nml. -The pt has no prohibitive toxicities from continuing C4 R-CEOP at this time. Will continue current doses. -Plan to complete 6 cycles of R-CEOP, but will reevaluate after each cycle  -Plan to repeat scans after C6 -Advised pt that Testosterone replacement increases the risk of blood clots. Recommend pt continue to use the lowest possible dose of Testosterone possible. This dosage may need to be adjusted after chemotherapy.  -Recommend pt use Acyclovir prn for  mouth sores  -Recommend pt f/u with Dr. WrJeffie Pollocks scheduled -Will see back in 3 weeks with labs    FOLLOW UP: Plz schedule C5 as ordered (3 days of treatment + Udenyca) Portflush and labs on C5D1 MD visit on C5D1   The total time spent in the appt was 30 minutes and more than 50% was on counseling and direct patient cares, toxicity assessment , ordering and management of induction chemo-immunotherapy  All of the patient's questions were answered with apparent satisfaction. The patient knows to call the clinic with any problems, questions or concerns.    GaSullivan LoneD MSMunisingAHIVMS SCMercy San Juan HospitalTMemphis Veterans Affairs Medical Centerematology/Oncology Physician CoScenic Mountain Medical Center(Office):       [phone] cell):  [phone]):           [phone]/08/2020 10:17 AM  I, JaYevette Edwardsam acting as a scribe for Dr. GaSullivan Lone  .I have reviewed the above documentation for accuracy and completeness, and I agree with the above. .GBrunetta GeneraD

## 2020-05-27 NOTE — Patient Instructions (Signed)
[place] Discharge Instructions for Patients Receiving Chemotherapy  Today you received the following chemotherapy agents: Etoposide   If you develop nausea and vomiting that is not controlled by your nausea medication, call the clinic.   BELOW ARE SYMPTOMS THAT SHOULD BE REPORTED IMMEDIATELY:  *FEVER GREATER THAN 100.5 F  *CHILLS WITH OR WITHOUT FEVER  NAUSEA AND VOMITING THAT IS NOT CONTROLLED WITH YOUR NAUSEA MEDICATION  *UNUSUAL SHORTNESS OF BREATH  *UNUSUAL BRUISING OR BLEEDING  TENDERNESS IN MOUTH AND THROAT WITH OR WITHOUT PRESENCE OF ULCERS  *URINARY PROBLEMS  *BOWEL PROBLEMS  UNUSUAL RASH Items with * indicate a potential emergency and should be followed up as soon as possible.  Feel free to call the clinic should you have any questions or concerns. The clinic phone number is [phone].  Please show the [place] at check-in to the Emergency Department and triage nurse.

## 2020-05-28 ENCOUNTER — Other Ambulatory Visit: Payer: Self-pay

## 2020-05-28 ENCOUNTER — Inpatient Hospital Stay: Payer: PPO

## 2020-05-28 ENCOUNTER — Ambulatory Visit: Payer: PPO

## 2020-05-28 ENCOUNTER — Telehealth: Payer: Self-pay | Admitting: Hematology

## 2020-05-28 VITALS — BP 123/70 | HR 100 | Temp 98.0°F | Resp 18

## 2020-05-28 DIAGNOSIS — C83398 Diffuse large b-cell lymphoma of other extranodal and solid organ sites: Secondary | ICD-10-CM

## 2020-05-28 DIAGNOSIS — Z7189 Other specified counseling: Secondary | ICD-10-CM

## 2020-05-28 DIAGNOSIS — C6210 Malignant neoplasm of unspecified descended testis: Secondary | ICD-10-CM

## 2020-05-28 DIAGNOSIS — Z5112 Encounter for antineoplastic immunotherapy: Secondary | ICD-10-CM | POA: Diagnosis not present

## 2020-05-28 DIAGNOSIS — C8339 Diffuse large B-cell lymphoma, extranodal and solid organ sites: Secondary | ICD-10-CM

## 2020-05-28 MED ORDER — SODIUM CHLORIDE 0.9 % IV SOLN
75.0000 mg/m2 | Freq: Once | INTRAVENOUS | Status: AC
  Administered 2020-05-28: 160 mg via INTRAVENOUS
  Filled 2020-05-28: qty 8

## 2020-05-28 MED ORDER — SODIUM CHLORIDE 0.9 % IV SOLN
Freq: Once | INTRAVENOUS | Status: AC
  Filled 2020-05-28: qty 250

## 2020-05-28 MED ORDER — PROCHLORPERAZINE MALEATE 10 MG PO TABS
ORAL_TABLET | ORAL | Status: AC
  Filled 2020-05-28: qty 1

## 2020-05-28 MED ORDER — HEPARIN SOD (PORK) LOCK FLUSH 100 UNIT/ML IV SOLN
500.0000 [IU] | Freq: Once | INTRAVENOUS | Status: AC | PRN
  Administered 2020-05-28: 500 [IU]
  Filled 2020-05-28: qty 5

## 2020-05-28 MED ORDER — PROCHLORPERAZINE MALEATE 10 MG PO TABS
10.0000 mg | ORAL_TABLET | Freq: Once | ORAL | Status: AC
  Administered 2020-05-28: 10 mg via ORAL

## 2020-05-28 MED ORDER — SODIUM CHLORIDE 0.9% FLUSH
10.0000 mL | INTRAVENOUS | Status: DC | PRN
  Administered 2020-05-28: 10 mL
  Filled 2020-05-28: qty 10

## 2020-05-28 NOTE — Patient Instructions (Signed)
[place] Discharge Instructions for Patients Receiving Chemotherapy  Today you received the following chemotherapy agents: Etoposide   If you develop nausea and vomiting that is not controlled by your nausea medication, call the clinic.   BELOW ARE SYMPTOMS THAT SHOULD BE REPORTED IMMEDIATELY:  *FEVER GREATER THAN 100.5 F  *CHILLS WITH OR WITHOUT FEVER  NAUSEA AND VOMITING THAT IS NOT CONTROLLED WITH YOUR NAUSEA MEDICATION  *UNUSUAL SHORTNESS OF BREATH  *UNUSUAL BRUISING OR BLEEDING  TENDERNESS IN MOUTH AND THROAT WITH OR WITHOUT PRESENCE OF ULCERS  *URINARY PROBLEMS  *BOWEL PROBLEMS  UNUSUAL RASH Items with * indicate a potential emergency and should be followed up as soon as possible.  Feel free to call the clinic should you have any questions or concerns. The clinic phone number is [phone].  Please show the [place] at check-in to the Emergency Department and triage nurse.

## 2020-05-28 NOTE — Telephone Encounter (Signed)
Scheduled per 08/11 los, patient has received updated calender.

## 2020-05-29 ENCOUNTER — Inpatient Hospital Stay: Payer: PPO

## 2020-05-29 ENCOUNTER — Other Ambulatory Visit: Payer: Self-pay

## 2020-05-29 VITALS — BP 128/72 | HR 78 | Temp 98.2°F | Resp 18

## 2020-05-29 DIAGNOSIS — C83398 Diffuse large b-cell lymphoma of other extranodal and solid organ sites: Secondary | ICD-10-CM

## 2020-05-29 DIAGNOSIS — C6210 Malignant neoplasm of unspecified descended testis: Secondary | ICD-10-CM

## 2020-05-29 DIAGNOSIS — Z7189 Other specified counseling: Secondary | ICD-10-CM

## 2020-05-29 DIAGNOSIS — Z5112 Encounter for antineoplastic immunotherapy: Secondary | ICD-10-CM | POA: Diagnosis not present

## 2020-05-29 DIAGNOSIS — C8339 Diffuse large B-cell lymphoma, extranodal and solid organ sites: Secondary | ICD-10-CM

## 2020-05-29 MED ORDER — HEPARIN SOD (PORK) LOCK FLUSH 100 UNIT/ML IV SOLN
500.0000 [IU] | Freq: Once | INTRAVENOUS | Status: AC | PRN
  Administered 2020-05-29: 500 [IU]
  Filled 2020-05-29: qty 5

## 2020-05-29 MED ORDER — PEGFILGRASTIM 6 MG/0.6ML ~~LOC~~ PSKT
6.0000 mg | PREFILLED_SYRINGE | Freq: Once | SUBCUTANEOUS | Status: AC
  Administered 2020-05-29: 6 mg via SUBCUTANEOUS

## 2020-05-29 MED ORDER — PROCHLORPERAZINE MALEATE 10 MG PO TABS
10.0000 mg | ORAL_TABLET | Freq: Once | ORAL | Status: AC
  Administered 2020-05-29: 10 mg via ORAL

## 2020-05-29 MED ORDER — SODIUM CHLORIDE 0.9 % IV SOLN
Freq: Once | INTRAVENOUS | Status: AC
  Filled 2020-05-29: qty 250

## 2020-05-29 MED ORDER — SODIUM CHLORIDE 0.9 % IV SOLN
75.0000 mg/m2 | Freq: Once | INTRAVENOUS | Status: AC
  Administered 2020-05-29: 160 mg via INTRAVENOUS
  Filled 2020-05-29: qty 8

## 2020-05-29 MED ORDER — SODIUM CHLORIDE 0.9% FLUSH
10.0000 mL | INTRAVENOUS | Status: DC | PRN
  Administered 2020-05-29: 10 mL
  Filled 2020-05-29: qty 10

## 2020-05-29 NOTE — Patient Instructions (Signed)
[place] Discharge Instructions for Patients Receiving Chemotherapy  Today you received the following chemotherapy agents: Etoposide   If you develop nausea and vomiting that is not controlled by your nausea medication, call the clinic.   BELOW ARE SYMPTOMS THAT SHOULD BE REPORTED IMMEDIATELY:  *FEVER GREATER THAN 100.5 F  *CHILLS WITH OR WITHOUT FEVER  NAUSEA AND VOMITING THAT IS NOT CONTROLLED WITH YOUR NAUSEA MEDICATION  *UNUSUAL SHORTNESS OF BREATH  *UNUSUAL BRUISING OR BLEEDING  TENDERNESS IN MOUTH AND THROAT WITH OR WITHOUT PRESENCE OF ULCERS  *URINARY PROBLEMS  *BOWEL PROBLEMS  UNUSUAL RASH Items with * indicate a potential emergency and should be followed up as soon as possible.  Feel free to call the clinic should you have any questions or concerns. The clinic phone number is [phone].  Please show the [place] at check-in to the Emergency Department and triage nurse.

## 2020-05-30 ENCOUNTER — Encounter: Payer: Self-pay | Admitting: Hematology

## 2020-06-01 DIAGNOSIS — Z85828 Personal history of other malignant neoplasm of skin: Secondary | ICD-10-CM | POA: Diagnosis not present

## 2020-06-01 DIAGNOSIS — L821 Other seborrheic keratosis: Secondary | ICD-10-CM | POA: Diagnosis not present

## 2020-06-01 DIAGNOSIS — Z85820 Personal history of malignant melanoma of skin: Secondary | ICD-10-CM | POA: Diagnosis not present

## 2020-06-01 DIAGNOSIS — L570 Actinic keratosis: Secondary | ICD-10-CM | POA: Diagnosis not present

## 2020-06-01 NOTE — Progress Notes (Signed)
[facility] Note  06/08/2020     CHIEF COMPLAINT Patient presents for Retina Follow Up   HISTORY OF PRESENT ILLNESS: Antonio [name] is a 79 y.o. male who presents to the clinic today for:   HPI    Retina Follow Up    Patient presents with  Dry AMD.  In both eyes.  This started months ago.  Severity is moderate.  Duration of months.  Since onset it is stable.  I, the attending physician,  performed the HPI with the patient and updated documentation appropriately.          Comments    Pt states vision is the same OU.  Patient complains of watering OU--currently using Refresh QD OU.  Patient denies any new or worsening floaters or fol OU.       Last edited by Bernarda Caffey, MD on 06/09/2020  8:10 AM. (History)    pt was dx with lymphoma since his last visit here, he has been undergoing chemo for the past 2 months, he feels like his vision is doing okay, he states they have been watering this summer, he is using Refresh in the morning  Referring physician: [name], DO [place],  [place] [address]  HISTORICAL INFORMATION:   Selected notes from the [facility] for ARMD OU; Moved from [place], [place]  Ocular Hx - nonexudative ARMD OU; PVD OU; pseudophakia OU (Toric OU - 12/2016 by Dr. Nance Pear, [place], [facility]);  PMH - HTN; [facility] - ARMD - Mother   CURRENT MEDICATIONS: Current Outpatient Medications (Ophthalmic Drugs)  Medication Sig  . Ketotifen Fumarate (REFRESH EYE ITCH RELIEF OP) Place 1 drop into both eyes daily as needed (itchy eyes).    No current facility-administered medications for this visit. (Ophthalmic Drugs)   Current Outpatient Medications (Other)  Medication Sig  . calcium carbonate (TUMS - DOSED IN MG ELEMENTAL CALCIUM) 500 MG chewable tablet Chew 1 tablet by mouth as needed for indigestion or heartburn.  . Cholecalciferol (VITAMIN D3) 2000 units TABS Take 1 tablet by mouth daily.  .  Cyanocobalamin (B-12 PO) Take 1 tablet by mouth daily.  . diphenhydramine-acetaminophen (TYLENOL PM) 25-500 MG TABS tablet Take 1 tablet by mouth at bedtime as needed.  . lidocaine-prilocaine (EMLA) cream   . losartan (COZAAR) 100 MG tablet Take 1 tablet (100 mg total) by mouth daily.  . meloxicam (MOBIC) 7.5 MG tablet TAKE 1 TABLET (7.5 MG TOTAL) BY MOUTH 2 (TWO) TIMES DAILY AS NEEDED FOR PAIN. (Patient taking differently: Take 7.5 mg by mouth daily. )  . Multiple Vitamins-Minerals (PRESERVISION AREDS 2 PO) Take 1 capsule by mouth 2 (two) times daily.   . ondansetron (ZOFRAN) 8 MG tablet Take 1 tablet (8 mg total) by mouth every 8 (eight) hours as needed for nausea or vomiting. (Patient not taking: Reported on 03/30/2020)  . predniSONE (DELTASONE) 20 MG tablet Take 3 tablets (60 mg total) by mouth daily with breakfast. For 5 days with each cycle of chemotherapy  . prochlorperazine (COMPAZINE) 10 MG tablet Take 1 tablet (10 mg total) by mouth every 6 (six) hours as needed for nausea or vomiting. (Patient not taking: Reported on 03/30/2020)  . simvastatin (ZOCOR) 40 MG tablet TAKE 1 TABLET BY MOUTH EVERY DAY (Patient taking differently: Take 40 mg by mouth daily at 6 PM. )  . Testosterone 20.25 MG/ACT (1.62%) GEL SMARTSIG:40.5 Milligram(s) Topical Every Morning  . valACYclovir (VALTREX) 1000  MG tablet Take one tablet by mouth twice daily for 3 days as needed for flares.   No current facility-administered medications for this visit. (Other)      REVIEW OF SYSTEMS: ROS    Positive for: Musculoskeletal, Endocrine, Cardiovascular, Eyes   Negative for: Constitutional, Gastrointestinal, Neurological, Skin, Genitourinary, HENT, Respiratory, Psychiatric, Allergic/Imm, Heme/Lymph   Last edited by Doneen Poisson on 06/08/2020  2:19 PM. (History)       ALLERGIES Allergies  Allergen Reactions  . Latex Itching    Latex tape causes itching and redness.    PAST MEDICAL HISTORY Past Medical  History:  Diagnosis Date  . Age-related macular degeneration, dry, both eyes   . 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 Carotid stenosis, asymptomatic, left ?  . ED (erectile dysfunction)   . Elevated diaphragm    right  . Erectile dysfunction   . History of basal cell carcinoma (BCC) excision    multiple excision in office  . History of kidney stones   . History of seizure    03-03-2020 per pt x1 while taking interferan for melanoma in 2001 ,  none since, told a side effect of interferan  . History of squamous cell carcinoma excision    multiple skin excision in office  . Hyperlipidemia   . Hypertension    followed by pcp  (03-03-2020 per pt had a stress test approx. 2010, told normal)  . LBBB (left bundle branch block)   . Lymphoma of testis ([place]) 2000   s/p right orchiectomy   . Mass of left testis   . MGUS (monoclonal gammopathy of unknown significance)    oncologist--- dr Irene Limbo  . Personal history of malignant melanoma of skin dermatologist--- dr Wilhemina Bonito   dx 2001 left calf area  s/p WLE ,  completed one year interferan (03-03-2020 pt states did not involve lymph nodes, no recurrence)  . Urgency of urination   . Wears hearing aid in both ears    Past Surgical History:  Procedure Laterality Date  . CATARACT EXTRACTION W/ INTRAOCULAR LENS IMPLANT Bilateral 2018  . IR IMAGING GUIDED PORT INSERTION  03/31/2020  . MELANOMA EXCISION  10/1999  . ORCHIECTOMY Right 2000  . ORCHIECTOMY Left 03/05/2020   Procedure: ORCHIECTOMY;  Surgeon: [name], MD;  Location: [facility];  Service: Urology;  Laterality: Left;  . TONSILLECTOMY  child  . TOTAL KNEE ARTHROPLASTY Right 06/2009    FAMILY HISTORY Family History  Problem Relation Age of Onset  . Cataracts Mother   .  Transient ischemic attack Mother   . Asthma Mother   . Cataracts Father   . Diabetes Father        borderline  . Hypertension Father   . Hyperlipidemia Father   . Hyperlipidemia Son   . Hypertension Son   . Amblyopia Neg Hx   . Blindness Neg Hx   . Glaucoma Neg Hx   . Macular degeneration Neg Hx   . Retinal detachment Neg Hx   . Strabismus Neg Hx   . Retinitis pigmentosa Neg Hx     SOCIAL HISTORY Social History   Tobacco Use  . Smoking status: Never Smoker  . Smokeless tobacco: Never Used  Vaping Use  . Vaping Use: Never used  Substance Use Topics  . Alcohol  use: Yes    Alcohol/week: 14.0 standard drinks    Types: 14 Glasses of wine per week    Comment: 2 wine daily  . Drug use: Never         OPHTHALMIC EXAM:  Base Eye Exam    Visual Acuity (Snellen - Linear)      Right Left   Dist cc 20/20 -1 20/20 -1   Correction: Glasses       Tonometry (Tonopen, 2:24 PM)      Right Left   Pressure 17 14       Pupils      Dark Light Shape React APD   Right 3 2 Round Brisk 0   Left 3 2 Round Brisk 0       Visual Fields      Left Right    Full Full       Extraocular Movement      Right Left    Full Full       Neuro/Psych    Oriented x3: Yes   Mood/Affect: Normal       Dilation    Both eyes: 1.0% Mydriacyl, 2.5% Phenylephrine @ 2:24 PM        Slit Lamp and Fundus Exam    External Exam      Right Left   External Brow ptosis - mild Brow ptosis - mild       Slit Lamp Exam      Right Left   Lids/Lashes Dermatochalasis - upper lid Dermatochalasis - upper lid   Conjunctiva/Sclera mild inferior Conjunctivochalasis mild inferior Conjunctivochalasis, nasal Pinguecula   Cornea Arcus, 1+Punctate epithelial erosions, Well healed temporal cataract wounds Arcus, Well healed cataract wounds, trace Punctate epithelial erosions, fine endopigment   Anterior Chamber Deep and quiet Deep and quiet   Iris Round with moderate dilated to 61mm Round with moderate dilated  to 88mm   Lens toric Posterior chamber intraocular lens in good postion with marks at 0300 and 0900, trace Posterior capsular opacification Toric Posterior chamber intraocular lens in good position with marks at 0200 and 0800; trace Posterior capsular opacification   Vitreous Vitreous syneresis, Posterior vitreous detachment Vitreous syneresis, Posterior vitreous detachment       Fundus Exam      Right Left   Disc Mild pallor, sharp rim, Compact, Tilted with Peripapillary atrophy 360 compact, mild tilt, Peripapillary atrophy 360, mild Pallor, Sharp rim   C/D Ratio 0.4 0.3   Macula Flat, Blunted foveal reflex, Drusen, RPE mottling, clumping and atrophy - stable, mild, No heme or edema Good foveal reflex, Drusen, RPE mottling, clumping and atrophy - stable, mild, No heme or edema   Vessels Vascular attenuation, mild Tortuousity Vascular attenuation, Tortuous, mild AV crossing changes   Periphery Attached; scattered Reticular degeneration, No heme  Attached; scattered Reticular degeneration, No heme         Refraction    Wearing Rx      Sphere Cylinder Axis Add   Right -0.75 +0.50 106 +2.50   Left -0.50 +0.75 121 +2.50   Type: PAL          IMAGING AND PROCEDURES  Imaging and Procedures for 11/10/17  OCT, Retina - OU - Both Eyes       Right Eye Quality was good. Central Foveal Thickness: 325. Progression has been stable. Findings include normal foveal contour, no IRF, no SRF, retinal drusen , outer retinal atrophy (Stable focal areas of outer retinal atrophy / ellipsoid dropout; scattered drusen).  Left Eye Quality was good. Central Foveal Thickness: 322. Progression has been stable. Findings include normal foveal contour, no IRF, no SRF, retinal drusen , outer retinal atrophy (Scattered drusen; focal ORA IT to fovea).   Notes Images captured and stored on drive  Diagnosis / Impression:  Nonexudative ARMD OU - no significant change from prior  Clinical management:  See  below  Abbreviations: NFP - Normal foveal profile. CME - cystoid macular edema. PED - pigment epithelial detachment. IRF - intraretinal fluid. SRF - subretinal fluid. EZ - ellipsoid zone. ERM - epiretinal membrane. ORA - outer retinal atrophy. ORT - outer retinal tubulation. SRHM - subretinal hyper-reflective material                  ASSESSMENT/PLAN:    ICD-10-CM   1. Intermediate stage nonexudative age-related macular degeneration of both eyes  H35.3132   2. Retinal edema  H35.81 OCT, Retina - OU - Both Eyes  3. Pseudophakia of both eyes  Z96.1   4. Ocular hypertension, bilateral  H40.053   5. Bilateral posterior capsular opacification  H26.493     1. Age related macular degeneration, non-exudative, both eyes  - The incidence, anatomy, and pathology of dry AMD, risk of progression, and the AREDS and AREDS 2 study including smoking risks discussed with patient.  - no longer using Foresee AMD monitoring device due to insurance issues  - no significant progression on exam or OCT -- scattered drusen and ORA  - f/u 6 months, sooner/PRN  2. No retinal edema on exam or OCT  3. Pseudophakia OU  - s/p CE/PCIOL OU (Toric + femto) 12/2016 by Dr. Nance Pear in [facility]  - had post op IOP issue OS  - beautiful surgery, doing well  - monitor  4. H/o ocular hypertension OU  - IOP good today (17,14) off combigan  - monitor  5. PCO OU  - Discussed YAG Cap treatment for PCO;  - not yet visually significant  - monitor for now  Ophthalmic Meds Ordered this visit:  No orders of the defined types were placed in this encounter.      Return in about 6 months (around 12/09/2020) for f/u non-exu ARMD OU, DFE, OCT.  There are no Patient Instructions on file for this visit.  This document serves as a record of services personally performed by Gardiner Sleeper, MD, PhD. It was created on their behalf by Leeann Must, [place], an ophthalmic technician. The creation of this record is the  provider's dictation and/or activities during the visit.    Electronically signed by: Leeann Must, [place] 08.16.2021 8:13 AM  This document serves as a record of services personally performed by Gardiner Sleeper, MD, PhD. It was created on their behalf by San Jetty. Owens Shark, OA an ophthalmic technician. The creation of this record is the provider's dictation and/or activities during the visit.    Electronically signed by: San Jetty. Owens Shark, [place] 08.23.2021 8:13 AM  Gardiner Sleeper, M.D., Ph.D. Diseases & Surgery of the Retina and [place] 06/08/2020   I have reviewed the above documentation for accuracy and completeness, and I agree with the above. Gardiner Sleeper, M.D., Ph.D. 06/09/20 8:13 AM   Abbreviations: M myopia (nearsighted); A astigmatism; H hyperopia (farsighted); P presbyopia; Mrx spectacle prescription;  CTL contact lenses; OD right eye; OS left eye; OU both eyes  XT exotropia; ET esotropia; PEK punctate epithelial keratitis; PEE punctate epithelial erosions; DES dry eye syndrome;  MGD meibomian gland dysfunction; ATs artificial tears; PFAT's preservative free artificial tears; [place] nuclear sclerotic cataract; PSC posterior subcapsular cataract; ERM epi-retinal membrane; PVD posterior vitreous detachment; RD retinal detachment; DM diabetes mellitus; DR diabetic retinopathy; NPDR non-proliferative diabetic retinopathy; PDR proliferative diabetic retinopathy; CSME clinically significant macular edema; DME diabetic macular edema; dbh dot blot hemorrhages; CWS cotton wool spot; POAG primary open angle glaucoma; C/D cup-to-disc ratio; HVF humphrey visual field; GVF goldmann visual field; OCT optical coherence tomography; IOP intraocular pressure; BRVO Branch retinal vein occlusion; CRVO central retinal vein occlusion; CRAO central retinal artery occlusion; BRAO branch retinal artery occlusion; RT retinal tear; SB scleral buckle; PPV pars plana vitrectomy; VH Vitreous  hemorrhage; PRP panretinal laser photocoagulation; IVK intravitreal kenalog; VMT vitreomacular traction; MH Macular hole;  NVD neovascularization of the disc; NVE neovascularization elsewhere; AREDS age related eye disease study; ARMD age related macular degeneration; POAG primary open angle glaucoma; EBMD epithelial/anterior basement membrane dystrophy; ACIOL anterior chamber intraocular lens; IOL intraocular lens; PCIOL posterior chamber intraocular lens; Phaco/IOL phacoemulsification with intraocular lens placement; [place] photorefractive keratectomy; LASIK laser assisted in situ keratomileusis; HTN hypertension; DM diabetes mellitus; COPD chronic obstructive pulmonary disease

## 2020-06-04 ENCOUNTER — Telehealth: Payer: Self-pay | Admitting: *Deleted

## 2020-06-04 NOTE — Telephone Encounter (Signed)
Patient called requesting the Covid Booster. Stated that he would like for you to place an order for him to get this ASAP.  Stated that he is Immunocompromised by Chemo Treatments.  Please Advise.

## 2020-06-04 NOTE — Telephone Encounter (Signed)
Antonio [name] can arrange his booster vaccine as below.    [facility] to Provide Third COVID-19 Vaccine Doses for Immunocompromised What: Following last week's FDA authorization and CDC recommendation that those with weakened immune systems receive a third dose of the COVID-19 vaccine, [facility] plans to provide the booster shots beginning Thursday, Aug. 19. Where: Booster shots will be offered at all [place] vaccination clinics, with locations and schedules available at [email or web address] How: Beginning Aug. 19, those eligible should schedule a third dose booster vaccination appointment by visiting [email or web address] or calling [phone] Monday through Friday between 7 a.m. and 7 p.m. Click here for Frequently Asked Questions about [facility] plans to administer a third dose booster shot following FDA and CDC guidance.

## 2020-06-04 NOTE — Telephone Encounter (Signed)
Patient notified and agreed.

## 2020-06-08 ENCOUNTER — Ambulatory Visit (INDEPENDENT_AMBULATORY_CARE_PROVIDER_SITE_OTHER): Payer: PPO | Admitting: Ophthalmology

## 2020-06-08 ENCOUNTER — Other Ambulatory Visit: Payer: Self-pay

## 2020-06-08 DIAGNOSIS — Z961 Presence of intraocular lens: Secondary | ICD-10-CM

## 2020-06-08 DIAGNOSIS — H3581 Retinal edema: Secondary | ICD-10-CM | POA: Diagnosis not present

## 2020-06-08 DIAGNOSIS — H26493 Other secondary cataract, bilateral: Secondary | ICD-10-CM | POA: Diagnosis not present

## 2020-06-08 DIAGNOSIS — H353132 Nonexudative age-related macular degeneration, bilateral, intermediate dry stage: Secondary | ICD-10-CM

## 2020-06-08 DIAGNOSIS — H40053 Ocular hypertension, bilateral: Secondary | ICD-10-CM | POA: Diagnosis not present

## 2020-06-08 DIAGNOSIS — H353131 Nonexudative age-related macular degeneration, bilateral, early dry stage: Secondary | ICD-10-CM

## 2020-06-09 ENCOUNTER — Encounter (INDEPENDENT_AMBULATORY_CARE_PROVIDER_SITE_OTHER): Payer: Self-pay | Admitting: Ophthalmology

## 2020-06-10 ENCOUNTER — Encounter: Payer: Self-pay | Admitting: Internal Medicine

## 2020-06-10 ENCOUNTER — Telehealth: Payer: Self-pay

## 2020-06-10 NOTE — Telephone Encounter (Signed)
Considering his total knee arthroplasty was in 2010 (not in the past 6 months), he does not meet criteria for preop antibiotics to prevent septic joint even in view of his immunosuppression from chemo.  No prophylactic antibiotics are recommended for folks with implanted hardware in the latest guidelines I just reviewed.

## 2020-06-10 NOTE — Telephone Encounter (Signed)
Dr. Moses Manners - DDS called and is asking if patient still needs to be treated with antibiotic for dental procedures secondary to S/P knee replacement and currently undergoing chemotherapy. I do not see an antibiotic on his current med list.

## 2020-06-11 ENCOUNTER — Ambulatory Visit: Payer: PPO | Attending: Internal Medicine

## 2020-06-11 DIAGNOSIS — Z23 Encounter for immunization: Secondary | ICD-10-CM

## 2020-06-11 NOTE — Progress Notes (Signed)
   Covid-19 Vaccination Clinic  Name:  RAESEAN BARTOLETTI    MRN: [id number] DOB: [birth year]  06/11/2020  Mr. Detweiler was observed post Covid-19 immunization for 15 minutes without incident. He was provided with Vaccine Information Sheet and instruction to access the V-Safe system.   Mr. Welch was instructed to call 911 with any severe reactions post vaccine: Marland Kitchen Difficulty breathing  . Swelling of face and throat  . A fast heartbeat  . A bad rash all over body  . Dizziness and weakness

## 2020-06-11 NOTE — Telephone Encounter (Signed)
Called Dr. Sydnee Cabal office 06/10/20 and they were at lunch. Called 06/11/20 at 8:34 a.m. and provided them the recommendation from Dr. Mariea Clonts.

## 2020-06-13 ENCOUNTER — Ambulatory Visit: Payer: PPO

## 2020-06-16 ENCOUNTER — Inpatient Hospital Stay: Payer: PPO

## 2020-06-16 ENCOUNTER — Other Ambulatory Visit: Payer: Self-pay

## 2020-06-16 ENCOUNTER — Inpatient Hospital Stay: Payer: PPO | Admitting: Hematology

## 2020-06-16 VITALS — BP 131/85 | HR 84 | Temp 98.0°F | Resp 18

## 2020-06-16 VITALS — BP 138/84 | HR 79 | Temp 97.9°F | Resp 18 | Ht 72.0 in | Wt 193.8 lb

## 2020-06-16 DIAGNOSIS — C8339 Diffuse large B-cell lymphoma, extranodal and solid organ sites: Secondary | ICD-10-CM

## 2020-06-16 DIAGNOSIS — C6210 Malignant neoplasm of unspecified descended testis: Secondary | ICD-10-CM

## 2020-06-16 DIAGNOSIS — Z95828 Presence of other vascular implants and grafts: Secondary | ICD-10-CM | POA: Diagnosis not present

## 2020-06-16 DIAGNOSIS — Z5112 Encounter for antineoplastic immunotherapy: Secondary | ICD-10-CM | POA: Diagnosis not present

## 2020-06-16 DIAGNOSIS — Z5111 Encounter for antineoplastic chemotherapy: Secondary | ICD-10-CM

## 2020-06-16 DIAGNOSIS — Z7189 Other specified counseling: Secondary | ICD-10-CM

## 2020-06-16 LAB — RETICULOCYTES
Immature Retic Fract: 11.9 % (ref 2.3–15.9)
RBC.: 3.53 MIL/uL — ABNORMAL LOW (ref 4.22–5.81)
Retic Count, Absolute: 57.9 10*3/uL (ref 19.0–186.0)
Retic Ct Pct: 1.6 % (ref 0.4–3.1)

## 2020-06-16 LAB — CMP (CANCER CENTER ONLY)
ALT: 22 U/L (ref 0–44)
AST: 17 U/L (ref 15–41)
Albumin: 3.8 g/dL (ref 3.5–5.0)
Alkaline Phosphatase: 105 U/L (ref 38–126)
Anion gap: 5 (ref 5–15)
BUN: 17 mg/dL (ref 8–23)
CO2: 26 mmol/L (ref 22–32)
Calcium: 9.6 mg/dL (ref 8.9–10.3)
Chloride: 108 mmol/L (ref 98–111)
Creatinine: 0.83 mg/dL (ref 0.61–1.24)
GFR, Est AFR Am: >60 mL/min (ref >60)
GFR, Estimated: >60 mL/min (ref >60)
Glucose, Bld: 138 mg/dL — ABNORMAL HIGH (ref 70–99)
Potassium: 4.2 mmol/L (ref 3.5–5.1)
Sodium: 139 mmol/L (ref 135–145)
Total Bilirubin: 0.4 mg/dL (ref 0.3–1.2)
Total Protein: 6.4 g/dL — ABNORMAL LOW (ref 6.5–8.1)

## 2020-06-16 LAB — CBC WITH DIFFERENTIAL/PLATELET
Abs Immature Granulocytes: 0.31 10*3/uL — ABNORMAL HIGH (ref 0.00–0.07)
Basophils Absolute: 0.1 10*3/uL (ref 0.0–0.1)
Basophils Relative: 1 %
Eosinophils Absolute: 0.1 10*3/uL (ref 0.0–0.5)
Eosinophils Relative: 1 %
HCT: 37.2 % — ABNORMAL LOW (ref 39.0–52.0)
Hemoglobin: 11.9 g/dL — ABNORMAL LOW (ref 13.0–17.0)
Immature Granulocytes: 3 %
Lymphocytes Relative: 5 %
Lymphs Abs: 0.6 10*3/uL — ABNORMAL LOW (ref 0.7–4.0)
MCH: 33.1 pg (ref 26.0–34.0)
MCHC: 32 g/dL (ref 30.0–36.0)
MCV: 103.6 fL — ABNORMAL HIGH (ref 80.0–100.0)
Monocytes Absolute: 0.3 10*3/uL (ref 0.1–1.0)
Monocytes Relative: 3 %
Neutro Abs: 10.4 10*3/uL — ABNORMAL HIGH (ref 1.7–7.7)
Neutrophils Relative %: 87 %
Platelets: 200 10*3/uL (ref 150–400)
RBC: 3.59 MIL/uL — ABNORMAL LOW (ref 4.22–5.81)
RDW: 15.3 % (ref 11.5–15.5)
WBC: 11.9 10*3/uL — ABNORMAL HIGH (ref 4.0–10.5)
nRBC: 0 % (ref 0.0–0.2)

## 2020-06-16 MED ORDER — DIPHENHYDRAMINE HCL 25 MG PO CAPS
50.0000 mg | ORAL_CAPSULE | Freq: Once | ORAL | Status: AC
  Administered 2020-06-16: 50 mg via ORAL

## 2020-06-16 MED ORDER — VINCRISTINE SULFATE CHEMO INJECTION 1 MG/ML
1.0000 mg | Freq: Once | INTRAVENOUS | Status: AC
  Administered 2020-06-16: 1 mg via INTRAVENOUS
  Filled 2020-06-16: qty 1

## 2020-06-16 MED ORDER — SODIUM CHLORIDE 0.9 % IV SOLN
Freq: Once | INTRAVENOUS | Status: AC
  Filled 2020-06-16: qty 250

## 2020-06-16 MED ORDER — HEPARIN SOD (PORK) LOCK FLUSH 100 UNIT/ML IV SOLN
500.0000 [IU] | Freq: Once | INTRAVENOUS | Status: AC | PRN
  Administered 2020-06-16: 500 [IU]
  Filled 2020-06-16: qty 5

## 2020-06-16 MED ORDER — DIPHENHYDRAMINE HCL 25 MG PO CAPS
ORAL_CAPSULE | ORAL | Status: AC
  Filled 2020-06-16: qty 2

## 2020-06-16 MED ORDER — SODIUM CHLORIDE 0.9% FLUSH
10.0000 mL | INTRAVENOUS | Status: DC | PRN
  Administered 2020-06-16: 10 mL
  Filled 2020-06-16: qty 10

## 2020-06-16 MED ORDER — PALONOSETRON HCL INJECTION 0.25 MG/5ML
INTRAVENOUS | Status: AC
  Filled 2020-06-16: qty 5

## 2020-06-16 MED ORDER — PALONOSETRON HCL INJECTION 0.25 MG/5ML
0.2500 mg | Freq: Once | INTRAVENOUS | Status: AC
  Administered 2020-06-16: 0.25 mg via INTRAVENOUS

## 2020-06-16 MED ORDER — SODIUM CHLORIDE 0.9 % IV SOLN
10.0000 mg | Freq: Once | INTRAVENOUS | Status: AC
  Administered 2020-06-16: 10 mg via INTRAVENOUS
  Filled 2020-06-16 (×2): qty 1
  Filled 2020-06-16: qty 10

## 2020-06-16 MED ORDER — ACETAMINOPHEN 325 MG PO TABS
650.0000 mg | ORAL_TABLET | Freq: Once | ORAL | Status: AC
  Administered 2020-06-16: 650 mg via ORAL

## 2020-06-16 MED ORDER — SODIUM CHLORIDE 0.9 % IV SOLN
75.0000 mg/m2 | Freq: Once | INTRAVENOUS | Status: AC
  Administered 2020-06-16: 160 mg via INTRAVENOUS
  Filled 2020-06-16: qty 8

## 2020-06-16 MED ORDER — SODIUM CHLORIDE 0.9 % IV SOLN
375.0000 mg/m2 | Freq: Once | INTRAVENOUS | Status: AC
  Administered 2020-06-16: 800 mg via INTRAVENOUS
  Filled 2020-06-16: qty 30

## 2020-06-16 MED ORDER — ACETAMINOPHEN 325 MG PO TABS
ORAL_TABLET | ORAL | Status: AC
  Filled 2020-06-16: qty 2

## 2020-06-16 MED ORDER — SODIUM CHLORIDE 0.9 % IV SOLN
500.0000 mg/m2 | Freq: Once | INTRAVENOUS | Status: AC
  Administered 2020-06-16: 1060 mg via INTRAVENOUS
  Filled 2020-06-16: qty 53

## 2020-06-16 NOTE — Progress Notes (Signed)
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 male who has been referred to [place] by Dr. Hollace Kinnier for evaluation and management of Bone lesion of left lower leg. The pt reports that he is doing well overall.   The pt reports that he cannot feel the ovoid finding from his 04/09/18 MRI as noted below. He denies being able to feel anything in his skin as well. He notes that he received a needle injection in his knee prior to this MRI. The pt notes that his knee has lost much cartilage and is considering a knee replacement. He denies any recent injuries to his knee or falls. He has contacted Dr. Alphonsa Overall in surgery and another surgeon as well and will be making an appointment.   The pt notes that he does not feel any differently recently as compared to 6 months to a year ago. He does note that he normally has constipation in the spring, and has continued to have mild constipation. He denies any other concerns or symptoms.   The pt previously saw my colleague Dr. Lurline Del for primary testicular B-cell lymphoma in 2000. The pt notes that the involvement was limited to only one testicle. He received 6 intrathecal treatments in his spine, and received 4 cycles of chemotherapy (thinks this was CHOP) and radiation as well. The pt was followed by an oncologist for several years at the [facility] in [place] after moving from [place] after his [place] treatment.   The pt notes that he was first diagnosed with melanoma in October 2000. He noticed a strange spot on his left leg that was surgically resected. The pt's personal notes report a 2.9cm thick, level 4, no LN involvement, high risk stage  II, treated with Interferon three times a week for one year. He notes some squamous cell and basal cell involvements as well and sees a dermatologist, Dr. Wilhemina Bonito, regularly.   Of note prior to the patient's visit today, pt has had MRI Left Knee completed on 04/09/18 with results revealing An ovoid, lobulated, 64m T2 hyperintense focus within the medial subcutaneous far 1.946mdistal to the joint demonstrates demonstrates nonspecific imaging characteristics.   Most recent lab results (03/01/18) of CBC w/diff is as follows: all values are WNL except for RBC at 4.54, MCV at 100.4, MCH at 33.8.   On review of systems, pt reports left knee pain, mild constipation, and denies new or concerning symptoms, noticing any new lumps or bumps, pain along the spine, abdominal pains, problems passing urine, testicular pain/swelling, and any other symptoms.   Interval History:  WiHAI GRABEeturns today for management and evaluation evaluation of new testicular mass. He is here prior to C5D1 R-CEOP. The patient's last visit with [place] on 05/27/2020. The pt reports that he is doing well overall.   The pt reports that he has continued to have non debilitating fatigue. He does not feel that his fatigue is cumulative or worsening at this time. Pt received his COVID19 booster last week. He is interested in receiving the annual flu injection.  Lab results today (06/16/20) of CBC w/diff and CMP is as follows: all values are WNL except for WBC at  11.9K, RBC at 3.59, Hgb at 11.9, HCT at 37.2, MCV at 103.6, Neutro Abs at 10.4K, Lymphs Abs at 0.6K, Abs Immature Granulocytes at 0.31K, Glucose at 138, Total Protein at 6.4. 06/16/2020 Reticulocytes is as follows: Retic Ct Pct at 1.6, RBC at 3.53, Retic Ct Abs at 57.9, Immature Retic Fract at 11.9  On review of systems, pt reports fatigue and denies rash, mouth sores, fevers, chills, leg swelling, abdominal pain, chest pain, SOB, night sweats, constipation, tingling/numbness  in hands/feet and any other symptoms.   MEDICAL HISTORY:  Past Medical History:  Diagnosis Date  . Age-related macular degeneration, dry, both eyes   . 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 Carotid stenosis, asymptomatic, left ?  . ED (erectile dysfunction)   . Elevated diaphragm    right  . Erectile dysfunction   . History of basal cell carcinoma (BCC) excision    multiple excision in office  . History of kidney stones   . History of seizure    03-03-2020 per pt x1 while taking interferan for melanoma in 2001 ,  none since, told a side effect of interferan  . History of squamous cell carcinoma excision    multiple skin excision in office  . Hyperlipidemia   . Hypertension    followed by pcp  (03-03-2020 per pt had a stress test approx. 2010, told normal)  . LBBB (left bundle branch block)   . Lymphoma of testis ([place]) 2000   s/p right orchiectomy   . Mass of left testis   . MGUS (monoclonal gammopathy of unknown significance)    oncologist--- dr Irene Limbo  . Personal history of malignant melanoma of skin dermatologist--- dr Wilhemina Bonito   dx 2001 left calf area  s/p WLE ,  completed one year interferan (03-03-2020 pt states did not involve lymph nodes, no recurrence)  . Urgency of urination   . Wears hearing aid in both ears     SURGICAL HISTORY: Past Surgical History:  Procedure Laterality Date  . CATARACT EXTRACTION W/ INTRAOCULAR LENS IMPLANT Bilateral 2018  . IR IMAGING GUIDED PORT INSERTION  03/31/2020  . MELANOMA EXCISION  10/1999  . ORCHIECTOMY Right 2000  . ORCHIECTOMY Left 03/05/2020   Procedure: ORCHIECTOMY;  Surgeon: [name], MD;  Location: [facility];  Service: Urology;  Laterality: Left;  . TONSILLECTOMY  child  . TOTAL KNEE ARTHROPLASTY Right 06/2009     SOCIAL HISTORY: Social History   Socioeconomic History  . Marital status: Married    Spouse name: Not on file  . Number of children: Not on file  . Years of education: Not on file  . Highest education level: Not on file  Occupational History  . Not on file  Tobacco Use  . Smoking status: Never Smoker  . Smokeless tobacco: Never Used  Vaping Use  . Vaping Use: Never used  Substance and Sexual Activity  . Alcohol use: Yes    Alcohol/week: 14.0 standard drinks    Types: 14 Glasses of wine per week    Comment: 2 wine daily  . Drug use: Never  . Sexual activity: Not on file  Other Topics Concern  . Not on file  Social History Narrative   Social History      Diet?good      Do you drink/eat  things with caffeine? seldom      Marital status?          yes                          What year were you married? 1962      Do you live in a house, apartment, assisted living, condo, trailer, etc.? house      Is it one or more stories?1      How many persons live in your home? 2      Do you have any pets in your home? (please list) no      Highest level of education completed? college      Current or past profession: president and [employer]      Do you exercise?   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 yes      Do you have a DNR form?       yes                            If not, do you want to discuss one?      Do you have signed POA/HPOA for forms?  yes      Functional Status      Do you have difficulty bathing or dressing yourself? no      Do you have difficulty preparing food or eating? no      Do you have difficulty managing your medications? no      Do you have difficulty managing your finances? no      Do you have difficulty affording your medications? no   Social Determinants of Health   Financial Resource Strain:   . Difficulty of Paying Living  Expenses: Not on file  Food Insecurity:   . Worried About Charity fundraiser in the Last Year: Not on file  . Ran Out of Food in the Last Year: Not on file  Transportation Needs:   . Lack of Transportation (Medical): Not on file  . Lack of Transportation (Non-Medical): Not on file  Physical Activity:   . Days of Exercise per Week: Not on file  . Minutes of Exercise per Session: Not on file  Stress:   . Feeling of Stress : Not on file  Social Connections:   . Frequency of Communication with Friends and Family: Not on file  . Frequency of Social Gatherings with Friends and Family: Not on file  . Attends Religious Services: Not on file  . Active Member of Clubs or Organizations: Not on file  . Attends Archivist Meetings: Not on file  . Marital Status: Not on file  Intimate Partner Violence:   . Fear of Current or Ex-Partner: Not on file  . Emotionally Abused: Not on file  . Physically Abused: Not on file  . Sexually Abused: Not on file    FAMILY HISTORY: Family History  Problem Relation Age of Onset  . Cataracts Mother   . Transient ischemic attack Mother   . Asthma Mother   . Cataracts Father   . Diabetes  Father        borderline  . Hypertension Father   . Hyperlipidemia Father   . Hyperlipidemia Son   . Hypertension Son   . Amblyopia Neg Hx   . Blindness Neg Hx   . Glaucoma Neg Hx   . Macular degeneration Neg Hx   . Retinal detachment Neg Hx   . Strabismus Neg Hx   . Retinitis pigmentosa Neg Hx     ALLERGIES:  is allergic to latex.  MEDICATIONS:  Current Outpatient Medications  Medication Sig Dispense Refill  . calcium carbonate (TUMS - DOSED IN MG ELEMENTAL CALCIUM) 500 MG chewable tablet Chew 1 tablet by mouth as needed for indigestion or heartburn.    . Cholecalciferol (VITAMIN D3) 2000 units TABS Take 1 tablet by mouth daily.    . Cyanocobalamin (B-12 PO) Take 1 tablet by mouth daily.    . diphenhydramine-acetaminophen (TYLENOL PM) 25-500 MG TABS  tablet Take 1 tablet by mouth at bedtime as needed.    Marland Kitchen Ketotifen Fumarate (REFRESH EYE ITCH RELIEF OP) Place 1 drop into both eyes daily as needed (itchy eyes).     Marland Kitchen lidocaine-prilocaine (EMLA) cream     . losartan (COZAAR) 100 MG tablet Take 1 tablet (100 mg total) by mouth daily. 90 tablet 1  . meloxicam (MOBIC) 7.5 MG tablet TAKE 1 TABLET (7.5 MG TOTAL) BY MOUTH 2 (TWO) TIMES DAILY AS NEEDED FOR PAIN. (Patient taking differently: Take 7.5 mg by mouth daily. ) 180 tablet 0  . Multiple Vitamins-Minerals (PRESERVISION AREDS 2 PO) Take 1 capsule by mouth 2 (two) times daily.     . ondansetron (ZOFRAN) 8 MG tablet Take 1 tablet (8 mg total) by mouth every 8 (eight) hours as needed for nausea or vomiting. (Patient not taking: Reported on 03/30/2020) 30 tablet 0  . predniSONE (DELTASONE) 20 MG tablet Take 3 tablets (60 mg total) by mouth daily with breakfast. For 5 days with each cycle of chemotherapy 15 tablet 5  . prochlorperazine (COMPAZINE) 10 MG tablet Take 1 tablet (10 mg total) by mouth every 6 (six) hours as needed for nausea or vomiting. (Patient not taking: Reported on 03/30/2020) 30 tablet 0  . simvastatin (ZOCOR) 40 MG tablet TAKE 1 TABLET BY MOUTH EVERY DAY (Patient taking differently: Take 40 mg by mouth daily at 6 PM. ) 90 tablet 1  . Testosterone 20.25 MG/ACT (1.62%) GEL SMARTSIG:40.5 Milligram(s) Topical Every Morning    . valACYclovir (VALTREX) 1000 MG tablet Take one tablet by mouth twice daily for 3 days as needed for flares. 30 tablet 1   No current facility-administered medications for this visit.    REVIEW OF SYSTEMS:   A 10+ POINT REVIEW OF SYSTEMS WAS OBTAINED including neurology, dermatology, psychiatry, cardiac, respiratory, lymph, extremities, GI, GU, Musculoskeletal, constitutional, breasts, reproductive, HEENT.  All pertinent positives are noted in the HPI.  All others are negative.   PHYSICAL EXAMINATION: ECOG PERFORMANCE STATUS: 0 - Asymptomatic VS reviewed -  stable  Vitals:   06/16/20 0912  BP: 138/84  Pulse: 79  Resp: 18  Temp: 97.9 F (36.6 C)  SpO2: 97%   Wt Readings from Last 3 Encounters:  06/16/20 193 lb 12.8 oz (87.9 kg)  05/27/20 194 lb 11.2 oz (88.3 kg)  05/06/20 192 lb 8 oz (87.3 kg)   Body mass index is 26.28 kg/m.    GENERAL:alert, in no acute distress and comfortable SKIN: no acute rashes, no significant lesions EYES: conjunctiva are pink and non-injected, sclera  anicteric OROPHARYNX: MMM, no exudates, no oropharyngeal erythema or ulceration NECK: supple, no JVD LYMPH:  no palpable lymphadenopathy in the cervical, axillary or inguinal regions LUNGS: clear to auscultation b/l with normal respiratory effort HEART: regular rate & rhythm ABDOMEN:  normoactive bowel sounds , non tender, not distended. No palpable hepatosplenomegaly.  Extremity: no pedal edema PSYCH: alert & oriented x 3 with fluent speech NEURO: no focal motor/sensory deficits  LABORATORY DATA:  I have reviewed the data as listed  . CBC Latest Ref Rng & Units 06/16/2020 05/27/2020 05/06/2020  WBC 4.0 - 10.5 K/uL 11.9(H) 10.9(H) 8.3  Hemoglobin 13.0 - 17.0 g/dL 11.9(L) 12.1(L) 12.2(L)  Hematocrit 39 - 52 % 37.2(L) 36.9(L) 37.5(L)  Platelets 150 - 400 K/uL 200 220 214   . CBC    Component Value Date/Time   WBC 11.9 (H) 06/16/2020 0901   RBC 3.53 (L) 06/16/2020 0901   RBC 3.59 (L) 06/16/2020 0901   HGB 11.9 (L) 06/16/2020 0901   HGB 11.9 (L) 04/01/2020 1111   HCT 37.2 (L) 06/16/2020 0901   HCT 40.8 12/19/2019 1057   PLT 200 06/16/2020 0901   PLT 159 04/01/2020 1111   MCV 103.6 (H) 06/16/2020 0901   MCH 33.1 06/16/2020 0901   MCHC 32.0 06/16/2020 0901   RDW 15.3 06/16/2020 0901   LYMPHSABS 0.6 (L) 06/16/2020 0901   MONOABS 0.3 06/16/2020 0901   EOSABS 0.1 06/16/2020 0901   BASOSABS 0.1 06/16/2020 0901    . CMP Latest Ref Rng & Units 06/16/2020 05/27/2020 05/06/2020  Glucose 70 - 99 mg/dL 138(H) 109(H) 137(H)  BUN 8 - 23 mg/dL '17 21 16   ' Creatinine 0.61 - 1.24 mg/dL 0.83 0.76 0.83  Sodium 135 - 145 mmol/L 139 141 142  Potassium 3.5 - 5.1 mmol/L 4.2 4.7 4.0  Chloride 98 - 111 mmol/L 108 110 108  CO2 22 - 32 mmol/L '26 24 26  ' Calcium 8.9 - 10.3 mg/dL 9.6 9.4 9.3  Total Protein 6.5 - 8.1 g/dL 6.4(L) 6.7 6.7  Total Bilirubin 0.3 - 1.2 mg/dL 0.4 0.5 0.5  Alkaline Phos 38 - 126 U/L 105 92 109  AST 15 - 41 U/L '17 19 22  ' ALT 0 - 44 U/L '22 25 25   ' 03/05/2020 Left Testicular Flow Pathology Report [id number]):    03/05/2020 FISH Panel:    03/05/2020 Left Testicular Mass Surgical Pathology ([id number]):    Component     Latest Ref Rng & Units 12/19/2019  Folate, Hemolysate     Not Estab. ng/mL 390.0  HCT     37.5 - 51.0 % 40.8  Folate, RBC     >498 ng/mL 956  Vitamin B12     180 - 914 pg/mL 882   03/01/18 CBC w/diff:   RADIOGRAPHIC STUDIES: I have personally reviewed the radiological images as listed and agreed with the findings in the report. OCT, Retina - OU - Both Eyes  Result Date: 06/09/2020 Right Eye Quality was good. Central Foveal Thickness: 325. Progression has been stable. Findings include normal foveal contour, no IRF, no SRF, retinal drusen , outer retinal atrophy (Stable focal areas of outer retinal atrophy / ellipsoid dropout; scattered drusen). Left Eye Quality was good. Central Foveal Thickness: 322. Progression has been stable. Findings include normal foveal contour, no IRF, no SRF, retinal drusen , outer retinal atrophy (Scattered drusen; focal ORA IT to fovea). 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 CME - cystoid macular edema. PED - pigment epithelial detachment. IRF - intraretinal fluid. SRF - subretinal fluid. EZ - ellipsoid zone. ERM - epiretinal membrane. ORA - outer retinal atrophy. ORT - outer retinal tubulation. SRHM - subretinal  hyper-reflective material    03/05/20 of Surgical Pathology ([id number]) SURGICAL PATHOLOGY  CASE: [id number]  PATIENT: Antonio [name]  Surgical Pathology Report      Clinical History: Left testicular mass (jmc)      FINAL MICROSCOPIC DIAGNOSIS:   A. TESTICLE AND CORD, LEFT, ORCHIECTOMY:  - Diffuse large B-cell lymphoma  -See comment    COMMENT:   The sections of the testis and spermatic cord nodules show effacement of  the architecture by an atypical lymphoid infiltrate characterized by  predominance of medium and large lymphoid cells with vesicular chromatin  and small nucleoli associated with brisk mitosis. The appearance is  primarily diffuse with lack of atypical follicles. Variable number of  admixed smaller lymphocytes are seen. To further evaluate this process,  flow cytometric analysis was performed ([id number]) and shows a  monoclonal kappa-restricted B-cell population. In addition, a battery  of immunohistochemical stains was performed and shows that the atypical  lymphoid cells are positive for CD20, CD79a, PAX 5, CD10 (weak), BCL-2,  MUM-1 and cytoplasmic kappa. No significant staining is seen with  CD138, cyclin D1, CD30, CD34, TdT, EBV in situ hybridization or  cytoplasmic lambda. Ki67 shows variable expression ranging from 10% to  >50%. There is an admixed T-cell population to a lesser extent as seen  with CD3 and CD5 and there is no apparent co-expression of CD5 in B-cell  areas. The overall features are consistent with involvement by diffuse  large B-cell lymphoma, GCB type. The results were discussed with Dr.  Jeffie Pollock and Irene Limbo on 03/11/2020.    ASSESSMENT & PLAN:   79 y.o. male with  1. Ovoid lesion near the medial aspect of the left knee ? Injection granuloma vs other etiology for lesion. however given previous h/o melanoma and lymphoma will need to be cautious and try to wotrk this up.  04/09/18 MRI of Left Knee revealed An ovoid,  lobulated, 31m T2 hyperintense focus within the medial subcutaneous far 1.936mdistal to the joint demonstrates demonstrates nonspecific imaging characteristics.   05/17/18 [place] Soft Tissue Biopsy of the Left Knee did not identify any malignant cells and revealed inflammatory cells.  2. MGUS- IgM kappa monoclonal paraproteinemia 05/02/18 MMP revealed M Protein at 0.5 with immunofixation revealing IgM with kappa light chains  3/ h/o Melanoma   4. H/o Rt Primary testicular large B cell lymphoma in 2000 treatment with R-CHOP x 6 cycles as part of clinical trial + IT MTX x 4 cycles and RT to left testicle  5. 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 Appears to be a new event since it is occurring 20 yrs after his previous rt testicular lymphoma  PLAN: -Discussed pt labwork today, 06/16/20; blood counts are holding steady, blood chemistries look good, Reticulocytes are nml -The pt has no prohibitive toxicities from continuing C5 R-CEOP at this time. Will continue current doses. -Plan to complete 6 cycles of R-CEOP, but will reevaluate after each cycle  -Plan to repeat scans after C6 -Advised pt that it's okay to receive the annual flu vaccine when available.  -Will see back with C6D1   FOLLOW UP: Plz schedule C6 as ordered (3 days of treatment + Udenyca) Portflush and  labs on C6D1 MD visit on C6D1    The total time spent in the appt was 30 minutes and more than 50% was on counseling and direct patient cares, ordering and management of chenmotherapy  All of the patient's questions were answered with apparent satisfaction. The patient knows to call the clinic with any problems, questions or concerns.    Sullivan Lone MD [place] AAHIVMS [facility] Hematology/Oncology Physician [facility]  (Office):       [phone] (Work cell):  [phone] (Fax):           [phone]  06/16/2020 9:42 AM  I, Yevette Edwards, am acting as a scribe for  Dr. Sullivan Lone.   .I have reviewed the above documentation for accuracy and completeness, and I agree with the above. Brunetta Genera MD

## 2020-06-16 NOTE — Patient Instructions (Signed)
[place] Discharge Instructions for Patients Receiving Chemotherapy  Today you received the following chemotherapy agents Vincristine (ONCOVIN), Cyclophosphamide (CYTOXAN), Etoposide (VEPESID) & Rituximab-pvvr (RUXIENCE).  To help prevent nausea and vomiting after your treatment, we encourage you to take your nausea medication as prescribed.   If you develop nausea and vomiting that is not controlled by your nausea medication, call the clinic.   BELOW ARE SYMPTOMS THAT SHOULD BE REPORTED IMMEDIATELY:  *FEVER GREATER THAN 100.5 F  *CHILLS WITH OR WITHOUT FEVER  NAUSEA AND VOMITING THAT IS NOT CONTROLLED WITH YOUR NAUSEA MEDICATION  *UNUSUAL SHORTNESS OF BREATH  *UNUSUAL BRUISING OR BLEEDING  TENDERNESS IN MOUTH AND THROAT WITH OR WITHOUT PRESENCE OF ULCERS  *URINARY PROBLEMS  *BOWEL PROBLEMS  UNUSUAL RASH Items with * indicate a potential emergency and should be followed up as soon as possible.  Feel free to call the clinic should you have any questions or concerns. The clinic phone number is [phone].  Please show the [place] at check-in to the Emergency Department and triage nurse.

## 2020-06-17 ENCOUNTER — Ambulatory Visit: Payer: PPO

## 2020-06-17 ENCOUNTER — Inpatient Hospital Stay: Payer: PPO | Attending: Hematology

## 2020-06-17 ENCOUNTER — Other Ambulatory Visit: Payer: Self-pay

## 2020-06-17 VITALS — BP 133/64 | HR 100 | Temp 98.3°F | Resp 20

## 2020-06-17 DIAGNOSIS — Z5111 Encounter for antineoplastic chemotherapy: Secondary | ICD-10-CM | POA: Diagnosis not present

## 2020-06-17 DIAGNOSIS — C8339 Diffuse large B-cell lymphoma, extranodal and solid organ sites: Secondary | ICD-10-CM

## 2020-06-17 DIAGNOSIS — C83398 Diffuse large b-cell lymphoma of other extranodal and solid organ sites: Secondary | ICD-10-CM

## 2020-06-17 DIAGNOSIS — Z5189 Encounter for other specified aftercare: Secondary | ICD-10-CM | POA: Diagnosis not present

## 2020-06-17 DIAGNOSIS — C8519 Unspecified B-cell lymphoma, extranodal and solid organ sites: Secondary | ICD-10-CM | POA: Insufficient documentation

## 2020-06-17 DIAGNOSIS — Z7189 Other specified counseling: Secondary | ICD-10-CM

## 2020-06-17 DIAGNOSIS — Z5112 Encounter for antineoplastic immunotherapy: Secondary | ICD-10-CM | POA: Diagnosis not present

## 2020-06-17 DIAGNOSIS — C6210 Malignant neoplasm of unspecified descended testis: Secondary | ICD-10-CM

## 2020-06-17 MED ORDER — SODIUM CHLORIDE 0.9 % IV SOLN
Freq: Once | INTRAVENOUS | Status: AC
  Filled 2020-06-17: qty 250

## 2020-06-17 MED ORDER — SODIUM CHLORIDE 0.9% FLUSH
10.0000 mL | INTRAVENOUS | Status: DC | PRN
  Administered 2020-06-17: 10 mL
  Filled 2020-06-17: qty 10

## 2020-06-17 MED ORDER — PROCHLORPERAZINE MALEATE 10 MG PO TABS
10.0000 mg | ORAL_TABLET | Freq: Once | ORAL | Status: AC
  Administered 2020-06-17: 10 mg via ORAL

## 2020-06-17 MED ORDER — SODIUM CHLORIDE 0.9 % IV SOLN
75.0000 mg/m2 | Freq: Once | INTRAVENOUS | Status: AC
  Administered 2020-06-17: 160 mg via INTRAVENOUS
  Filled 2020-06-17: qty 8

## 2020-06-17 MED ORDER — PROCHLORPERAZINE MALEATE 10 MG PO TABS
ORAL_TABLET | ORAL | Status: AC
  Filled 2020-06-17: qty 1

## 2020-06-17 MED ORDER — HEPARIN SOD (PORK) LOCK FLUSH 100 UNIT/ML IV SOLN
500.0000 [IU] | Freq: Once | INTRAVENOUS | Status: AC | PRN
  Administered 2020-06-17: 500 [IU]
  Filled 2020-06-17: qty 5

## 2020-06-17 NOTE — Patient Instructions (Signed)
[place] Discharge Instructions for Patients Receiving Chemotherapy  Today you received the following chemotherapy agents: Etoposide   If you develop nausea and vomiting that is not controlled by your nausea medication, call the clinic.   BELOW ARE SYMPTOMS THAT SHOULD BE REPORTED IMMEDIATELY:  *FEVER GREATER THAN 100.5 F  *CHILLS WITH OR WITHOUT FEVER  NAUSEA AND VOMITING THAT IS NOT CONTROLLED WITH YOUR NAUSEA MEDICATION  *UNUSUAL SHORTNESS OF BREATH  *UNUSUAL BRUISING OR BLEEDING  TENDERNESS IN MOUTH AND THROAT WITH OR WITHOUT PRESENCE OF ULCERS  *URINARY PROBLEMS  *BOWEL PROBLEMS  UNUSUAL RASH Items with * indicate a potential emergency and should be followed up as soon as possible.  Feel free to call the clinic should you have any questions or concerns. The clinic phone number is [phone].  Please show the [place] at check-in to the Emergency Department and triage nurse.

## 2020-06-18 ENCOUNTER — Ambulatory Visit: Payer: PPO

## 2020-06-18 ENCOUNTER — Inpatient Hospital Stay: Payer: PPO

## 2020-06-18 ENCOUNTER — Other Ambulatory Visit: Payer: Self-pay

## 2020-06-18 VITALS — BP 144/79 | HR 94 | Temp 97.9°F | Resp 18

## 2020-06-18 DIAGNOSIS — Z7189 Other specified counseling: Secondary | ICD-10-CM

## 2020-06-18 DIAGNOSIS — C8339 Diffuse large B-cell lymphoma, extranodal and solid organ sites: Secondary | ICD-10-CM

## 2020-06-18 DIAGNOSIS — C6210 Malignant neoplasm of unspecified descended testis: Secondary | ICD-10-CM

## 2020-06-18 DIAGNOSIS — Z5112 Encounter for antineoplastic immunotherapy: Secondary | ICD-10-CM | POA: Diagnosis not present

## 2020-06-18 MED ORDER — SODIUM CHLORIDE 0.9 % IV SOLN
75.0000 mg/m2 | Freq: Once | INTRAVENOUS | Status: AC
  Administered 2020-06-18: 160 mg via INTRAVENOUS
  Filled 2020-06-18: qty 8

## 2020-06-18 MED ORDER — PEGFILGRASTIM 6 MG/0.6ML ~~LOC~~ PSKT
6.0000 mg | PREFILLED_SYRINGE | Freq: Once | SUBCUTANEOUS | Status: AC
  Administered 2020-06-18: 6 mg via SUBCUTANEOUS

## 2020-06-18 MED ORDER — PROCHLORPERAZINE MALEATE 10 MG PO TABS
10.0000 mg | ORAL_TABLET | Freq: Once | ORAL | Status: AC
  Administered 2020-06-18: 10 mg via ORAL

## 2020-06-18 MED ORDER — HEPARIN SOD (PORK) LOCK FLUSH 100 UNIT/ML IV SOLN
500.0000 [IU] | Freq: Once | INTRAVENOUS | Status: AC | PRN
  Administered 2020-06-18: 500 [IU]
  Filled 2020-06-18: qty 5

## 2020-06-18 MED ORDER — PROCHLORPERAZINE MALEATE 10 MG PO TABS
ORAL_TABLET | ORAL | Status: AC
  Filled 2020-06-18: qty 1

## 2020-06-18 MED ORDER — SODIUM CHLORIDE 0.9% FLUSH
10.0000 mL | INTRAVENOUS | Status: DC | PRN
  Administered 2020-06-18: 10 mL
  Filled 2020-06-18: qty 10

## 2020-06-18 MED ORDER — PEGFILGRASTIM 6 MG/0.6ML ~~LOC~~ PSKT
PREFILLED_SYRINGE | SUBCUTANEOUS | Status: AC
  Filled 2020-06-18: qty 0.6

## 2020-06-18 MED ORDER — SODIUM CHLORIDE 0.9 % IV SOLN
Freq: Once | INTRAVENOUS | Status: AC
  Filled 2020-06-18: qty 250

## 2020-06-18 NOTE — Patient Instructions (Signed)
[facility] Discharge Instructions for Patients Receiving Chemotherapy  Today you received the following chemotherapy agents: etoposide  To help prevent nausea and vomiting after your treatment, we encourage you to take your nausea medication as directed.   If you develop nausea and vomiting that is not controlled by your nausea medication, call the clinic.   BELOW ARE SYMPTOMS THAT SHOULD BE REPORTED IMMEDIATELY:  *FEVER GREATER THAN 100.5 F  *CHILLS WITH OR WITHOUT FEVER  NAUSEA AND VOMITING THAT IS NOT CONTROLLED WITH YOUR NAUSEA MEDICATION  *UNUSUAL SHORTNESS OF BREATH  *UNUSUAL BRUISING OR BLEEDING  TENDERNESS IN MOUTH AND THROAT WITH OR WITHOUT PRESENCE OF ULCERS  *URINARY PROBLEMS  *BOWEL PROBLEMS  UNUSUAL RASH Items with * indicate a potential emergency and should be followed up as soon as possible.  Feel free to call the clinic should you have any questions or concerns. The clinic phone number is [phone].  Please show the CHEMO ALERT CARD at check-in to the Emergency Department and triage nurse.   

## 2020-06-20 ENCOUNTER — Ambulatory Visit: Payer: PPO

## 2020-06-23 ENCOUNTER — Telehealth: Payer: Self-pay | Admitting: Hematology

## 2020-06-23 NOTE — Telephone Encounter (Signed)
Scheduled per 08/31 los, patient has been called and notified.  

## 2020-06-24 ENCOUNTER — Other Ambulatory Visit: Payer: Self-pay | Admitting: Internal Medicine

## 2020-06-30 DIAGNOSIS — E291 Testicular hypofunction: Secondary | ICD-10-CM | POA: Diagnosis not present

## 2020-07-06 ENCOUNTER — Other Ambulatory Visit: Payer: Self-pay | Admitting: Hematology

## 2020-07-06 ENCOUNTER — Telehealth: Payer: Self-pay | Admitting: *Deleted

## 2020-07-06 DIAGNOSIS — E291 Testicular hypofunction: Secondary | ICD-10-CM | POA: Diagnosis not present

## 2020-07-06 DIAGNOSIS — C8339 Diffuse large B-cell lymphoma, extranodal and solid organ sites: Secondary | ICD-10-CM

## 2020-07-06 DIAGNOSIS — C6212 Malignant neoplasm of descended left testis: Secondary | ICD-10-CM | POA: Diagnosis not present

## 2020-07-06 NOTE — Progress Notes (Unsigned)
Cbc

## 2020-07-06 NOTE — Telephone Encounter (Signed)
Opened in error

## 2020-07-08 ENCOUNTER — Inpatient Hospital Stay: Payer: PPO

## 2020-07-08 ENCOUNTER — Inpatient Hospital Stay: Payer: PPO | Admitting: Hematology

## 2020-07-08 ENCOUNTER — Other Ambulatory Visit: Payer: Self-pay | Admitting: Hematology

## 2020-07-08 ENCOUNTER — Other Ambulatory Visit: Payer: Self-pay

## 2020-07-08 VITALS — BP 137/80 | HR 84 | Temp 97.9°F | Resp 16

## 2020-07-08 DIAGNOSIS — Z5112 Encounter for antineoplastic immunotherapy: Secondary | ICD-10-CM | POA: Diagnosis not present

## 2020-07-08 DIAGNOSIS — Z95828 Presence of other vascular implants and grafts: Secondary | ICD-10-CM

## 2020-07-08 DIAGNOSIS — Z5111 Encounter for antineoplastic chemotherapy: Secondary | ICD-10-CM | POA: Diagnosis not present

## 2020-07-08 DIAGNOSIS — Z7189 Other specified counseling: Secondary | ICD-10-CM

## 2020-07-08 DIAGNOSIS — C8339 Diffuse large B-cell lymphoma, extranodal and solid organ sites: Secondary | ICD-10-CM | POA: Diagnosis not present

## 2020-07-08 DIAGNOSIS — C6210 Malignant neoplasm of unspecified descended testis: Secondary | ICD-10-CM

## 2020-07-08 LAB — CBC WITH DIFFERENTIAL/PLATELET
Abs Immature Granulocytes: 0.13 10*3/uL — ABNORMAL HIGH (ref 0.00–0.07)
Basophils Absolute: 0.1 10*3/uL (ref 0.0–0.1)
Basophils Relative: 0 %
Eosinophils Absolute: 0 10*3/uL (ref 0.0–0.5)
Eosinophils Relative: 0 %
HCT: 38.6 % — ABNORMAL LOW (ref 39.0–52.0)
Hemoglobin: 12.5 g/dL — ABNORMAL LOW (ref 13.0–17.0)
Immature Granulocytes: 1 %
Lymphocytes Relative: 3 %
Lymphs Abs: 0.5 10*3/uL — ABNORMAL LOW (ref 0.7–4.0)
MCH: 34 pg (ref 26.0–34.0)
MCHC: 32.4 g/dL (ref 30.0–36.0)
MCV: 104.9 fL — ABNORMAL HIGH (ref 80.0–100.0)
Monocytes Absolute: 0.4 10*3/uL (ref 0.1–1.0)
Monocytes Relative: 2 %
Neutro Abs: 16 10*3/uL — ABNORMAL HIGH (ref 1.7–7.7)
Neutrophils Relative %: 94 %
Platelets: 264 10*3/uL (ref 150–400)
RBC: 3.68 MIL/uL — ABNORMAL LOW (ref 4.22–5.81)
RDW: 14.5 % (ref 11.5–15.5)
WBC: 17.1 10*3/uL — ABNORMAL HIGH (ref 4.0–10.5)
nRBC: 0 % (ref 0.0–0.2)

## 2020-07-08 LAB — CMP (CANCER CENTER ONLY)
ALT: 27 U/L (ref 0–44)
AST: 19 U/L (ref 15–41)
Albumin: 3.9 g/dL (ref 3.5–5.0)
Alkaline Phosphatase: 86 U/L (ref 38–126)
Anion gap: 4 — ABNORMAL LOW (ref 5–15)
BUN: 18 mg/dL (ref 8–23)
CO2: 29 mmol/L (ref 22–32)
Calcium: 8.8 mg/dL — ABNORMAL LOW (ref 8.9–10.3)
Chloride: 109 mmol/L (ref 98–111)
Creatinine: 0.75 mg/dL (ref 0.61–1.24)
GFR, Est AFR Am: >60 mL/min (ref >60)
GFR, Estimated: >60 mL/min (ref >60)
Glucose, Bld: 104 mg/dL — ABNORMAL HIGH (ref 70–99)
Potassium: 4.3 mmol/L (ref 3.5–5.1)
Sodium: 142 mmol/L (ref 135–145)
Total Bilirubin: 0.6 mg/dL (ref 0.3–1.2)
Total Protein: 6.6 g/dL (ref 6.5–8.1)

## 2020-07-08 LAB — LACTATE DEHYDROGENASE: LDH: 160 U/L (ref 98–192)

## 2020-07-08 MED ORDER — PALONOSETRON HCL INJECTION 0.25 MG/5ML
0.2500 mg | Freq: Once | INTRAVENOUS | Status: AC
  Administered 2020-07-08: 0.25 mg via INTRAVENOUS

## 2020-07-08 MED ORDER — SODIUM CHLORIDE 0.9% FLUSH
10.0000 mL | Freq: Once | INTRAVENOUS | Status: AC
  Administered 2020-07-08: 10 mL via INTRAVENOUS
  Filled 2020-07-08: qty 10

## 2020-07-08 MED ORDER — SODIUM CHLORIDE 0.9 % IV SOLN
500.0000 mg/m2 | Freq: Once | INTRAVENOUS | Status: AC
  Administered 2020-07-08: 1060 mg via INTRAVENOUS
  Filled 2020-07-08: qty 53

## 2020-07-08 MED ORDER — SODIUM CHLORIDE 0.9 % IV SOLN
Freq: Once | INTRAVENOUS | Status: AC
  Filled 2020-07-08: qty 250

## 2020-07-08 MED ORDER — ACETAMINOPHEN 325 MG PO TABS
ORAL_TABLET | ORAL | Status: AC
  Filled 2020-07-08: qty 2

## 2020-07-08 MED ORDER — SODIUM CHLORIDE 0.9% FLUSH
10.0000 mL | INTRAVENOUS | Status: DC | PRN
  Filled 2020-07-08: qty 10

## 2020-07-08 MED ORDER — HEPARIN SOD (PORK) LOCK FLUSH 100 UNIT/ML IV SOLN
500.0000 [IU] | Freq: Once | INTRAVENOUS | Status: DC | PRN
  Filled 2020-07-08: qty 5

## 2020-07-08 MED ORDER — SODIUM CHLORIDE 0.9 % IV SOLN
10.0000 mg | Freq: Once | INTRAVENOUS | Status: AC
  Administered 2020-07-08: 10 mg via INTRAVENOUS
  Filled 2020-07-08: qty 10

## 2020-07-08 MED ORDER — VINCRISTINE SULFATE CHEMO INJECTION 1 MG/ML
1.0000 mg | Freq: Once | INTRAVENOUS | Status: AC
  Administered 2020-07-08: 1 mg via INTRAVENOUS
  Filled 2020-07-08: qty 1

## 2020-07-08 MED ORDER — DIPHENHYDRAMINE HCL 25 MG PO CAPS
50.0000 mg | ORAL_CAPSULE | Freq: Once | ORAL | Status: AC
  Administered 2020-07-08: 50 mg via ORAL

## 2020-07-08 MED ORDER — SODIUM CHLORIDE 0.9 % IV SOLN
75.0000 mg/m2 | Freq: Once | INTRAVENOUS | Status: AC
  Administered 2020-07-08: 160 mg via INTRAVENOUS
  Filled 2020-07-08: qty 8

## 2020-07-08 MED ORDER — SODIUM CHLORIDE 0.9 % IV SOLN
375.0000 mg/m2 | Freq: Once | INTRAVENOUS | Status: AC
  Administered 2020-07-08: 800 mg via INTRAVENOUS
  Filled 2020-07-08: qty 30

## 2020-07-08 MED ORDER — PALONOSETRON HCL INJECTION 0.25 MG/5ML
INTRAVENOUS | Status: AC
  Filled 2020-07-08: qty 5

## 2020-07-08 MED ORDER — ACETAMINOPHEN 325 MG PO TABS
650.0000 mg | ORAL_TABLET | Freq: Once | ORAL | Status: AC
  Administered 2020-07-08: 650 mg via ORAL

## 2020-07-08 MED ORDER — DIPHENHYDRAMINE HCL 25 MG PO CAPS
ORAL_CAPSULE | ORAL | Status: AC
  Filled 2020-07-08: qty 2

## 2020-07-08 NOTE — Patient Instructions (Signed)
[place] Discharge Instructions for Patients Receiving Chemotherapy  Today you received the following chemotherapy agents Vincristine (ONCOVIN), Cyclophosphamide (CYTOXAN), Etoposide (VEPESID) & Rituximab-pvvr (RUXIENCE).  To help prevent nausea and vomiting after your treatment, we encourage you to take your nausea medication as prescribed.   If you develop nausea and vomiting that is not controlled by your nausea medication, call the clinic.   BELOW ARE SYMPTOMS THAT SHOULD BE REPORTED IMMEDIATELY:  *FEVER GREATER THAN 100.5 F  *CHILLS WITH OR WITHOUT FEVER  NAUSEA AND VOMITING THAT IS NOT CONTROLLED WITH YOUR NAUSEA MEDICATION  *UNUSUAL SHORTNESS OF BREATH  *UNUSUAL BRUISING OR BLEEDING  TENDERNESS IN MOUTH AND THROAT WITH OR WITHOUT PRESENCE OF ULCERS  *URINARY PROBLEMS  *BOWEL PROBLEMS  UNUSUAL RASH Items with * indicate a potential emergency and should be followed up as soon as possible.  Feel free to call the clinic should you have any questions or concerns. The clinic phone number is [phone].  Please show the [place] at check-in to the Emergency Department and triage nurse.

## 2020-07-08 NOTE — Patient Instructions (Signed)

## 2020-07-08 NOTE — Progress Notes (Signed)
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 male who has been referred to [place] by Dr. Hollace Kinnier for evaluation and management of Bone lesion of left lower leg. The pt reports that he is doing well overall.   The pt reports that he cannot feel the ovoid finding from his 04/09/18 MRI as noted below. He denies being able to feel anything in his skin as well. He notes that he received a needle injection in his knee prior to this MRI. The pt notes that his knee has lost much cartilage and is considering a knee replacement. He denies any recent injuries to his knee or falls. He has contacted Dr. Alphonsa Overall in surgery and another surgeon as well and will be making an appointment.   The pt notes that he does not feel any differently recently as compared to 6 months to a year ago. He does note that he normally has constipation in the spring, and has continued to have mild constipation. He denies any other concerns or symptoms.   The pt previously saw my colleague Dr. Lurline Del for primary testicular B-cell lymphoma in 2000. The pt notes that the involvement was limited to only one testicle. He received 6 intrathecal treatments in his spine, and received 4 cycles of chemotherapy (thinks this was CHOP) and radiation as well. The pt was followed by an oncologist for several years at the [facility] in [place] after moving from [place] after his [place] treatment.   The pt notes that he was first diagnosed with melanoma in October 2000. He noticed a strange spot on his left leg that was surgically resected. The pt's personal notes report a 2.9cm thick, level 4, no LN involvement, high risk stage  II, treated with Interferon three times a week for one year. He notes some squamous cell and basal cell involvements as well and sees a dermatologist, Dr. Wilhemina Bonito, regularly.   Of note prior to the patient's visit today, pt has had MRI Left Knee completed on 04/09/18 with results revealing An ovoid, lobulated, 57m T2 hyperintense focus within the medial subcutaneous far 1.953mdistal to the joint demonstrates demonstrates nonspecific imaging characteristics.   Most recent lab results (03/01/18) of CBC w/diff is as follows: all values are WNL except for RBC at 4.54, MCV at 100.4, MCH at 33.8.   On review of systems, pt reports left knee pain, mild constipation, and denies new or concerning symptoms, noticing any new lumps or bumps, pain along the spine, abdominal pains, problems passing urine, testicular pain/swelling, and any other symptoms.   Interval History:   Antonio [name] today for management and evaluation evaluation of Left Primary Testicular Large B-cell Lymphoma. He is here for C6D1 R-CEOP. The patient's last visit with [place] on 06/16/2020. The pt reports that he is doing well overall.  The pt reports that his fatigue has been steady since our last visit. Pt had his testosterone levels checked with Dr. WrJeffie Pollockwhich were normal.   Lab results today (07/08/20) of CBC w/diff and CMP is as follows: all values are WNL except for WBC at 17.1K, RBC at 3.68, Hgb at 12.5, HCT at 38.6, MCV at 104.9, Neutro  Abs at 16.0K, Lymphs Abs at 0.5K, Abs Immature Granulocytes at 0.13K, Glucose at 104, Calcium at 8.8, Anion gap at 4. 07/08/2020 LDH at 160  On review of systems, pt reports fatigue and denies fevers, chills, mouth sores, pain at port site, N/V/D and any other symptoms.   MEDICAL HISTORY:  Past Medical History:  Diagnosis Date  . Age-related macular degeneration, dry, both eyes   . B-cell lymphoma [facility]) oncologist--- dr Irene Limbo   dx 2000 primary right testicular diffuse large b-cell  lymphoma;  s/p  right orchiectomy 2000, completed chemo, intrathcal chemo (spine injection's), and radiation to left testis in 2000  . Carotid stenosis, asymptomatic, left ?  . ED (erectile dysfunction)   . Elevated diaphragm    right  . Erectile dysfunction   . History of basal cell carcinoma (BCC) excision    multiple excision in office  . History of kidney stones   . History of seizure    03-03-2020 per pt x1 while taking interferan for melanoma in 2001 ,  none since, told a side effect of interferan  . History of squamous cell carcinoma excision    multiple skin excision in office  . Hyperlipidemia   . Hypertension    followed by pcp  (03-03-2020 per pt had a stress test approx. 2010, told normal)  . LBBB (left bundle branch block)   . Lymphoma of testis ([facility]) 2000   s/p right orchiectomy   . Mass of left testis   . MGUS (monoclonal gammopathy of unknown significance)    oncologist--- dr Irene Limbo  . Personal history of malignant melanoma of skin dermatologist--- dr Wilhemina Bonito   dx 2001 left calf area  s/p WLE ,  completed one year interferan (03-03-2020 pt states did not involve lymph nodes, no recurrence)  . Urgency of urination   . Wears hearing aid in both ears     SURGICAL HISTORY: Past Surgical History:  Procedure Laterality Date  . CATARACT EXTRACTION W/ INTRAOCULAR LENS IMPLANT Bilateral 2018  . IR IMAGING GUIDED PORT INSERTION  03/31/2020  . MELANOMA EXCISION  10/1999  . ORCHIECTOMY Right 2000  . ORCHIECTOMY Left 03/05/2020   Procedure: ORCHIECTOMY;  Surgeon: [name], MD;  Location: [facility];  Service: Urology;  Laterality: Left;  . TONSILLECTOMY  child  . TOTAL KNEE ARTHROPLASTY Right 06/2009    SOCIAL HISTORY: Social History   Socioeconomic History  . Marital status: Married    Spouse name: Not on file  . Number of children: Not on file  . Years of education: Not on file  . Highest education level: Not on file  Occupational History  .  Not on file  Tobacco Use  . Smoking status: Never Smoker  . Smokeless tobacco: Never Used  Vaping Use  . Vaping Use: Never used  Substance and Sexual Activity  . Alcohol use: Yes    Alcohol/week: 14.0 standard drinks    Types: 14 Glasses of wine per week    Comment: 2 wine daily  . Drug use: Never  . Sexual activity: Not on file  Other Topics Concern  . Not on file  Social History Narrative   Social History      Diet?good      Do you drink/eat things with caffeine? seldom      Marital status?          yes  What year were you married? 1962      Do you live in a house, apartment, assisted living, condo, trailer, etc.? house      Is it one or more stories?1      How many persons live in your home? 2      Do you have any pets in your home? (please list) no      Highest level of education completed? college      Current or past profession: president and [employer]      Do you exercise?   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 yes      Do you have a DNR form?       yes                            If not, do you want to discuss one?      Do you have signed POA/HPOA for forms?  yes      Functional Status      Do you have difficulty bathing or dressing yourself? no      Do you have difficulty preparing food or eating? no      Do you have difficulty managing your medications? no      Do you have difficulty managing your finances? no      Do you have difficulty affording your medications? no   Social Determinants of Health   Financial Resource Strain:   . Difficulty of Paying Living Expenses: Not on file  Food Insecurity:   . Worried About Charity fundraiser in the Last Year: Not on file  . Ran Out of Food in the Last Year: Not on file  Transportation Needs:   . Lack of Transportation (Medical): Not on file  . Lack of Transportation  (Non-Medical): Not on file  Physical Activity:   . Days of Exercise per Week: Not on file  . Minutes of Exercise per Session: Not on file  Stress:   . Feeling of Stress : Not on file  Social Connections:   . Frequency of Communication with Friends and Family: Not on file  . Frequency of Social Gatherings with Friends and Family: Not on file  . Attends Religious Services: Not on file  . Active Member of Clubs or Organizations: Not on file  . Attends Archivist Meetings: Not on file  . Marital Status: Not on file  Intimate Partner Violence:   . Fear of Current or Ex-Partner: Not on file  . Emotionally Abused: Not on file  . Physically Abused: Not on file  . Sexually Abused: Not on file    FAMILY HISTORY: Family History  Problem Relation Age of Onset  . Cataracts Mother   . Transient ischemic attack Mother   . Asthma Mother   . Cataracts Father   . Diabetes Father        borderline  . Hypertension Father   . Hyperlipidemia Father   . Hyperlipidemia Son   . Hypertension Son   . Amblyopia Neg Hx   . Blindness Neg Hx   . Glaucoma Neg Hx   .  Macular degeneration Neg Hx   . Retinal detachment Neg Hx   . Strabismus Neg Hx   . Retinitis pigmentosa Neg Hx     ALLERGIES:  is allergic to latex.  MEDICATIONS:  Current Outpatient Medications  Medication Sig Dispense Refill  . calcium carbonate (TUMS - DOSED IN MG ELEMENTAL CALCIUM) 500 MG chewable tablet Chew 1 tablet by mouth as needed for indigestion or heartburn.    . Cholecalciferol (VITAMIN D3) 2000 units TABS Take 1 tablet by mouth daily.    . Cyanocobalamin (B-12 PO) Take 1 tablet by mouth daily.    . diphenhydramine-acetaminophen (TYLENOL PM) 25-500 MG TABS tablet Take 1 tablet by mouth at bedtime as needed.    Marland Kitchen Ketotifen Fumarate (REFRESH EYE ITCH RELIEF OP) Place 1 drop into both eyes daily as needed (itchy eyes).     Marland Kitchen lidocaine-prilocaine (EMLA) cream     . losartan (COZAAR) 100 MG tablet Take 1 tablet  (100 mg total) by mouth daily. 90 tablet 1  . meloxicam (MOBIC) 7.5 MG tablet TAKE 1 TABLET (7.5 MG TOTAL) BY MOUTH 2 (TWO) TIMES DAILY AS NEEDED FOR PAIN. (Patient taking differently: Take 7.5 mg by mouth daily. ) 180 tablet 0  . Multiple Vitamins-Minerals (PRESERVISION AREDS 2 PO) Take 1 capsule by mouth 2 (two) times daily.     . ondansetron (ZOFRAN) 8 MG tablet Take 1 tablet (8 mg total) by mouth every 8 (eight) hours as needed for nausea or vomiting. (Patient not taking: Reported on 03/30/2020) 30 tablet 0  . predniSONE (DELTASONE) 20 MG tablet Take 3 tablets (60 mg total) by mouth daily with breakfast. For 5 days with each cycle of chemotherapy 15 tablet 5  . prochlorperazine (COMPAZINE) 10 MG tablet Take 1 tablet (10 mg total) by mouth every 6 (six) hours as needed for nausea or vomiting. (Patient not taking: Reported on 03/30/2020) 30 tablet 0  . simvastatin (ZOCOR) 40 MG tablet TAKE 1 TABLET BY MOUTH EVERY DAY 90 tablet 1  . Testosterone 20.25 MG/ACT (1.62%) GEL SMARTSIG:40.5 Milligram(s) Topical Every Morning    . valACYclovir (VALTREX) 1000 MG tablet Take one tablet by mouth twice daily for 3 days as needed for flares. 30 tablet 1   No current facility-administered medications for this visit.   Facility-Administered Medications Ordered in Other Visits  Medication Dose Route Frequency Provider Last Rate Last Admin  . cyclophosphamide (CYTOXAN) 1,060 mg in sodium chloride 0.9 % 250 mL chemo infusion  500 mg/m2 (Treatment Plan Recorded) Intravenous Once Brunetta Genera, MD      . etoposide (VEPESID) 160 mg in sodium chloride 0.9 % 500 mL chemo infusion  75 mg/m2 (Treatment Plan Recorded) Intravenous Once Brunetta Genera, MD      . heparin lock flush 100 unit/mL  500 Units Intracatheter Once PRN Brunetta Genera, MD      . riTUXimab-pvvr (RUXIENCE) 800 mg in sodium chloride 0.9 % 250 mL (2.4242 mg/mL) infusion  375 mg/m2 (Treatment Plan Recorded) Intravenous Once Brunetta Genera, MD      . sodium chloride flush (NS) 0.9 % injection 10 mL  10 mL Intracatheter PRN Brunetta Genera, MD        REVIEW OF SYSTEMS:   A 10+ POINT REVIEW OF SYSTEMS WAS OBTAINED including neurology, dermatology, psychiatry, cardiac, respiratory, lymph, extremities, GI, GU, Musculoskeletal, constitutional, breasts, reproductive, HEENT.  All pertinent positives are noted in the HPI.  All others are negative.   PHYSICAL EXAMINATION: ECOG PERFORMANCE STATUS:  0 - Asymptomatic VS reviewed - stable  There were no vitals filed for this visit. Wt Readings from Last 3 Encounters:  06/16/20 193 lb 12.8 oz (87.9 kg)  05/27/20 194 lb 11.2 oz (88.3 kg)  05/06/20 192 lb 8 oz (87.3 kg)   There is no height or weight on file to calculate BMI.    Exam was given in a chair   GENERAL:alert, in no acute distress and comfortable SKIN: no acute rashes, no significant lesions EYES: conjunctiva are pink and non-injected, sclera anicteric OROPHARYNX: MMM, no exudates, no oropharyngeal erythema or ulceration NECK: supple, no JVD LYMPH:  no palpable lymphadenopathy in the cervical, axillary or inguinal regions LUNGS: clear to auscultation b/l with normal respiratory effort HEART: regular rate & rhythm ABDOMEN:  normoactive bowel sounds , non tender, not distended. No palpable hepatosplenomegaly.  Extremity: no pedal edema PSYCH: alert & oriented x 3 with fluent speech NEURO: no focal motor/sensory deficits  LABORATORY DATA:  I have reviewed the data as listed  . CBC Latest Ref Rng & Units 07/08/2020 06/16/2020 05/27/2020  WBC 4.0 - 10.5 K/uL 17.1(H) 11.9(H) 10.9(H)  Hemoglobin 13.0 - 17.0 g/dL 12.5(L) 11.9(L) 12.1(L)  Hematocrit 39 - 52 % 38.6(L) 37.2(L) 36.9(L)  Platelets 150 - 400 K/uL 264 200 220   . CBC    Component Value Date/Time   WBC 17.1 (H) 07/08/2020 1015   RBC 3.68 (L) 07/08/2020 1015   HGB 12.5 (L) 07/08/2020 1015   HGB 11.9 (L) 04/01/2020 1111   HCT 38.6 (L) 07/08/2020  1015   HCT 40.8 12/19/2019 1057   PLT 264 07/08/2020 1015   PLT 159 04/01/2020 1111   MCV 104.9 (H) 07/08/2020 1015   MCH 34.0 07/08/2020 1015   MCHC 32.4 07/08/2020 1015   RDW 14.5 07/08/2020 1015   LYMPHSABS 0.5 (L) 07/08/2020 1015   MONOABS 0.4 07/08/2020 1015   EOSABS 0.0 07/08/2020 1015   BASOSABS 0.1 07/08/2020 1015    . CMP Latest Ref Rng & Units 07/08/2020 06/16/2020 05/27/2020  Glucose 70 - 99 mg/dL 104(H) 138(H) 109(H)  BUN 8 - 23 mg/dL '18 17 21  ' Creatinine 0.61 - 1.24 mg/dL 0.75 0.83 0.76  Sodium 135 - 145 mmol/L 142 139 141  Potassium 3.5 - 5.1 mmol/L 4.3 4.2 4.7  Chloride 98 - 111 mmol/L 109 108 110  CO2 22 - 32 mmol/L '29 26 24  ' Calcium 8.9 - 10.3 mg/dL 8.8(L) 9.6 9.4  Total Protein 6.5 - 8.1 g/dL 6.6 6.4(L) 6.7  Total Bilirubin 0.3 - 1.2 mg/dL 0.6 0.4 0.5  Alkaline Phos 38 - 126 U/L 86 105 92  AST 15 - 41 U/L '19 17 19  ' ALT 0 - 44 U/L '27 22 25   ' 03/05/2020 Left Testicular Flow Pathology Report ([id number]):    03/05/2020 FISH Panel:    03/05/2020 Left Testicular Mass Surgical Pathology ([id number]):    Component     Latest Ref Rng & Units 12/19/2019  Folate, Hemolysate     Not Estab. ng/mL 390.0  HCT     37.5 - 51.0 % 40.8  Folate, RBC     >498 ng/mL 956  Vitamin B12     180 - 914 pg/mL 882   03/01/18 CBC w/diff:   RADIOGRAPHIC STUDIES: I have personally reviewed the radiological images as listed and agreed with the findings in the report. OCT, Retina - OU - Both Eyes  Result Date: 06/09/2020 Right Eye Quality was good. Central Foveal Thickness: 325. Progression has been  stable. Findings include normal foveal contour, no IRF, no SRF, retinal drusen , outer retinal atrophy (Stable focal areas of outer retinal atrophy / ellipsoid dropout; scattered drusen). Left Eye Quality was good. Central Foveal Thickness: 322. Progression has been stable. Findings include normal foveal contour, no IRF, no SRF, retinal drusen , outer retinal atrophy (Scattered  drusen; focal ORA IT to fovea). 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 CME - cystoid macular edema. PED - pigment epithelial detachment. IRF - intraretinal fluid. SRF - subretinal fluid. EZ - ellipsoid zone. ERM - epiretinal membrane. ORA - outer retinal atrophy. ORT - outer retinal tubulation. SRHM - subretinal hyper-reflective material    03/05/20 of Surgical Pathology ([id number]) SURGICAL PATHOLOGY  CASE: [id number]  PATIENT: Antonio [name]  Surgical Pathology Report      Clinical History: Left testicular mass (jmc)      FINAL MICROSCOPIC DIAGNOSIS:   A. TESTICLE AND CORD, LEFT, ORCHIECTOMY:  - Diffuse large B-cell lymphoma  -See comment    COMMENT:   The sections of the testis and spermatic cord nodules show effacement of  the architecture by an atypical lymphoid infiltrate characterized by  predominance of medium and large lymphoid cells with vesicular chromatin  and small nucleoli associated with brisk mitosis. The appearance is  primarily diffuse with lack of atypical follicles. Variable number of  admixed smaller lymphocytes are seen. To further evaluate this process,  flow cytometric analysis was performed ([id number]) and shows a  monoclonal kappa-restricted B-cell population. In addition, a battery  of immunohistochemical stains was performed and shows that the atypical  lymphoid cells are positive for CD20, CD79a, PAX 5, CD10 (weak), BCL-2,  MUM-1 and cytoplasmic kappa. No significant staining is seen with  CD138, cyclin D1, CD30, CD34, TdT, EBV in situ hybridization or  cytoplasmic lambda. Ki67 shows variable expression ranging from 10% to  >50%. There is an admixed T-cell population to a lesser extent as seen  with CD3 and CD5 and there is no apparent co-expression of CD5 in B-cell  areas. The  overall features are consistent with involvement by diffuse  large B-cell lymphoma, GCB type. The results were discussed with Dr.  Jeffie Pollock and Irene Limbo on 03/11/2020.    ASSESSMENT & PLAN:   79 y.o. male with  1. Ovoid lesion near the medial aspect of the left knee ? Injection granuloma vs other etiology for lesion. however given previous h/o melanoma and lymphoma will need to be cautious and try to wotrk this up.  04/09/18 MRI of Left Knee revealed An ovoid, lobulated, 60m T2 hyperintense focus within the medial subcutaneous far 1.977mdistal to the joint demonstrates demonstrates nonspecific imaging characteristics.   05/17/18 [place] Soft Tissue Biopsy of the Left Knee did not identify any malignant cells and revealed inflammatory cells.  2. MGUS- IgM kappa monoclonal paraproteinemia 05/02/18 MMP revealed M Protein at 0.5 with immunofixation revealing IgM with kappa light chains  3/ h/o Melanoma   4. H/o Rt Primary testicular large B cell lymphoma in 2000 treatment with R-CHOP x 6 cycles as part of clinical trial + IT MTX x 4 cycles and RT to left testicle  5. 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 Appears to be a new event since it is occurring 20 yrs after his previous rt testicular lymphoma  PLAN: -Discussed pt labwork today, 07/08/20; blood  counts and chemistries are holding well, LDH is WNL -The pt has no prohibitive toxicities from continuing C6 R-CEOP at this time. This is the last planned cycle. -Advised pt that testosterone can affect energy levels, but that the primary driver of his fatigue is likely treatment. -Advised pt that fatigue with treatment can be cumulative, but is also reversible.  -Recommend pt receive the annual flu vaccine. Pt is scheduled to receive next week.  -Advised pt that we can discuss removing the Port-a-cath during his second f/u visit after treatment.  -Recommend pt avoid eating raw seafood for at least 2-3 months  after treatment. Would want blood counts to normalize first.  -Will repeat PET/CT in 5 weeks  -Will see back in 6 weeks with labs   FOLLOW UP: PET/CT in 5 weeks RTC with Dr Irene Limbo with labs in 6 weeks   The total time spent in the appt was 30 minutes and more than 50% was on counseling and direct patient cares, ordering and management of chemotherapy  All of the patient's questions were answered with apparent satisfaction. The patient knows to call the clinic with any problems, questions or concerns.    Sullivan Lone MD [place] AAHIVMS [facility] Hematology/Oncology Physician [facility]  (Office):       [phone] (Work cell):  [phone] (Fax):           [phone]  07/08/2020 2:16 PM  I, Yevette Edwards, am acting as a scribe for Dr. Sullivan Lone.   .I have reviewed the above documentation for accuracy and completeness, and I agree with the above. Brunetta Genera MD

## 2020-07-08 NOTE — Progress Notes (Signed)
Dr.Kale ok'd to proceed w/ tx before being seen.  Kennith Center, Pharm.D., CPP 07/08/2020@12 :33 PM

## 2020-07-09 ENCOUNTER — Inpatient Hospital Stay: Payer: PPO

## 2020-07-09 ENCOUNTER — Other Ambulatory Visit: Payer: Self-pay

## 2020-07-09 VITALS — BP 145/98 | HR 95 | Temp 98.3°F | Resp 20

## 2020-07-09 DIAGNOSIS — Z5112 Encounter for antineoplastic immunotherapy: Secondary | ICD-10-CM | POA: Diagnosis not present

## 2020-07-09 DIAGNOSIS — Z7189 Other specified counseling: Secondary | ICD-10-CM

## 2020-07-09 DIAGNOSIS — C8339 Diffuse large B-cell lymphoma, extranodal and solid organ sites: Secondary | ICD-10-CM

## 2020-07-09 DIAGNOSIS — C6210 Malignant neoplasm of unspecified descended testis: Secondary | ICD-10-CM

## 2020-07-09 MED ORDER — SODIUM CHLORIDE 0.9 % IV SOLN
75.0000 mg/m2 | Freq: Once | INTRAVENOUS | Status: AC
  Administered 2020-07-09: 160 mg via INTRAVENOUS
  Filled 2020-07-09: qty 8

## 2020-07-09 MED ORDER — PROCHLORPERAZINE MALEATE 10 MG PO TABS
10.0000 mg | ORAL_TABLET | Freq: Once | ORAL | Status: AC
  Administered 2020-07-09: 10 mg via ORAL

## 2020-07-09 MED ORDER — HEPARIN SOD (PORK) LOCK FLUSH 100 UNIT/ML IV SOLN
500.0000 [IU] | Freq: Once | INTRAVENOUS | Status: AC | PRN
  Administered 2020-07-09: 500 [IU]
  Filled 2020-07-09: qty 5

## 2020-07-09 MED ORDER — SODIUM CHLORIDE 0.9% FLUSH
10.0000 mL | INTRAVENOUS | Status: DC | PRN
  Administered 2020-07-09: 10 mL
  Filled 2020-07-09: qty 10

## 2020-07-09 MED ORDER — SODIUM CHLORIDE 0.9 % IV SOLN
Freq: Once | INTRAVENOUS | Status: AC
  Filled 2020-07-09: qty 250

## 2020-07-09 MED ORDER — PROCHLORPERAZINE MALEATE 10 MG PO TABS
ORAL_TABLET | ORAL | Status: AC
  Filled 2020-07-09: qty 1

## 2020-07-09 NOTE — Progress Notes (Signed)
Okay to treat with etoposide today per Dr. Irene Limbo.

## 2020-07-09 NOTE — Patient Instructions (Signed)
[facility] Discharge Instructions for Patients Receiving Chemotherapy  Today you received the following chemotherapy agents: etoposide  To help prevent nausea and vomiting after your treatment, we encourage you to take your nausea medication as directed.   If you develop nausea and vomiting that is not controlled by your nausea medication, call the clinic.   BELOW ARE SYMPTOMS THAT SHOULD BE REPORTED IMMEDIATELY:  *FEVER GREATER THAN 100.5 F  *CHILLS WITH OR WITHOUT FEVER  NAUSEA AND VOMITING THAT IS NOT CONTROLLED WITH YOUR NAUSEA MEDICATION  *UNUSUAL SHORTNESS OF BREATH  *UNUSUAL BRUISING OR BLEEDING  TENDERNESS IN MOUTH AND THROAT WITH OR WITHOUT PRESENCE OF ULCERS  *URINARY PROBLEMS  *BOWEL PROBLEMS  UNUSUAL RASH Items with * indicate a potential emergency and should be followed up as soon as possible.  Feel free to call the clinic should you have any questions or concerns. The clinic phone number is [phone].  Please show the CHEMO ALERT CARD at check-in to the Emergency Department and triage nurse.   

## 2020-07-10 ENCOUNTER — Inpatient Hospital Stay: Payer: PPO

## 2020-07-10 ENCOUNTER — Other Ambulatory Visit: Payer: Self-pay

## 2020-07-10 VITALS — BP 156/86 | HR 79 | Temp 98.9°F | Resp 18

## 2020-07-10 DIAGNOSIS — Z7189 Other specified counseling: Secondary | ICD-10-CM

## 2020-07-10 DIAGNOSIS — C8339 Diffuse large B-cell lymphoma, extranodal and solid organ sites: Secondary | ICD-10-CM

## 2020-07-10 DIAGNOSIS — Z5112 Encounter for antineoplastic immunotherapy: Secondary | ICD-10-CM | POA: Diagnosis not present

## 2020-07-10 DIAGNOSIS — C6210 Malignant neoplasm of unspecified descended testis: Secondary | ICD-10-CM

## 2020-07-10 MED ORDER — SODIUM CHLORIDE 0.9 % IV SOLN
Freq: Once | INTRAVENOUS | Status: AC
  Filled 2020-07-10: qty 250

## 2020-07-10 MED ORDER — SODIUM CHLORIDE 0.9% FLUSH
10.0000 mL | INTRAVENOUS | Status: DC | PRN
  Administered 2020-07-10: 10 mL
  Filled 2020-07-10: qty 10

## 2020-07-10 MED ORDER — PROCHLORPERAZINE MALEATE 10 MG PO TABS
10.0000 mg | ORAL_TABLET | Freq: Once | ORAL | Status: AC
  Administered 2020-07-10: 10 mg via ORAL

## 2020-07-10 MED ORDER — PEGFILGRASTIM 6 MG/0.6ML ~~LOC~~ PSKT
6.0000 mg | PREFILLED_SYRINGE | Freq: Once | SUBCUTANEOUS | Status: AC
  Administered 2020-07-10: 6 mg via SUBCUTANEOUS

## 2020-07-10 MED ORDER — SODIUM CHLORIDE 0.9 % IV SOLN
75.0000 mg/m2 | Freq: Once | INTRAVENOUS | Status: AC
  Administered 2020-07-10: 160 mg via INTRAVENOUS
  Filled 2020-07-10: qty 8

## 2020-07-10 MED ORDER — PEGFILGRASTIM 6 MG/0.6ML ~~LOC~~ PSKT
PREFILLED_SYRINGE | SUBCUTANEOUS | Status: AC
  Filled 2020-07-10: qty 0.6

## 2020-07-10 MED ORDER — PROCHLORPERAZINE MALEATE 10 MG PO TABS
ORAL_TABLET | ORAL | Status: AC
  Filled 2020-07-10: qty 1

## 2020-07-10 MED ORDER — HEPARIN SOD (PORK) LOCK FLUSH 100 UNIT/ML IV SOLN
500.0000 [IU] | Freq: Once | INTRAVENOUS | Status: AC | PRN
  Administered 2020-07-10: 500 [IU]
  Filled 2020-07-10: qty 5

## 2020-07-13 ENCOUNTER — Telehealth: Payer: Self-pay | Admitting: *Deleted

## 2020-07-13 ENCOUNTER — Inpatient Hospital Stay: Payer: PPO

## 2020-07-13 ENCOUNTER — Other Ambulatory Visit: Payer: Self-pay

## 2020-07-13 ENCOUNTER — Other Ambulatory Visit: Payer: Self-pay | Admitting: Hematology

## 2020-07-13 VITALS — BP 131/80 | HR 72 | Temp 98.7°F | Resp 18

## 2020-07-13 DIAGNOSIS — Z5112 Encounter for antineoplastic immunotherapy: Secondary | ICD-10-CM | POA: Diagnosis not present

## 2020-07-13 DIAGNOSIS — Z7189 Other specified counseling: Secondary | ICD-10-CM

## 2020-07-13 DIAGNOSIS — C8339 Diffuse large B-cell lymphoma, extranodal and solid organ sites: Secondary | ICD-10-CM

## 2020-07-13 DIAGNOSIS — C6210 Malignant neoplasm of unspecified descended testis: Secondary | ICD-10-CM

## 2020-07-13 MED ORDER — PEGFILGRASTIM-CBQV 6 MG/0.6ML ~~LOC~~ SOSY
PREFILLED_SYRINGE | SUBCUTANEOUS | Status: AC
  Filled 2020-07-13: qty 0.6

## 2020-07-13 MED ORDER — PEGFILGRASTIM-CBQV 6 MG/0.6ML ~~LOC~~ SOSY
6.0000 mg | PREFILLED_SYRINGE | Freq: Once | SUBCUTANEOUS | Status: AC
  Administered 2020-07-13: 6 mg via SUBCUTANEOUS

## 2020-07-13 NOTE — Telephone Encounter (Signed)
Patient called to report his Neulasta On Pro fell off on Saturday before medication dispensed. Dr. Irene Limbo ordered injection to be given today at [facility]. Patient scheduled for injection this morning.  Patient had also called After Hours service - had  Received Telephone Advice fax from after hours [facility] advise ing patient to contact office this morning.

## 2020-07-14 NOTE — Progress Notes (Signed)
Neulasta OnPro DOS 07/10/20. Per Pt Device Mal-functioned, advised that adhesive would not stay and once device laid down on counter started to run dispensing dose. [employer] replacing device. Original device return to [employer] and new device being shippped. Case # [id number].

## 2020-08-07 ENCOUNTER — Telehealth: Payer: Self-pay | Admitting: Hematology

## 2020-08-07 NOTE — Telephone Encounter (Signed)
Scheduled per los, patient has been called and notified of upcoming appointments. 

## 2020-08-10 ENCOUNTER — Telehealth: Payer: Self-pay | Admitting: Internal Medicine

## 2020-08-10 NOTE — Telephone Encounter (Signed)
I called Antonio [name] to see if he would be okay with rescheduling his AWV from Wednesday to Thursday 10-28.

## 2020-08-12 ENCOUNTER — Encounter: Payer: PPO | Admitting: Nurse Practitioner

## 2020-08-13 ENCOUNTER — Ambulatory Visit (INDEPENDENT_AMBULATORY_CARE_PROVIDER_SITE_OTHER): Payer: PPO | Admitting: Nurse Practitioner

## 2020-08-13 ENCOUNTER — Other Ambulatory Visit: Payer: Self-pay

## 2020-08-13 ENCOUNTER — Telehealth: Payer: Self-pay

## 2020-08-13 ENCOUNTER — Encounter: Payer: Self-pay | Admitting: Nurse Practitioner

## 2020-08-13 ENCOUNTER — Encounter (HOSPITAL_COMMUNITY)
Admission: RE | Admit: 2020-08-13 | Discharge: 2020-08-13 | Disposition: A | Discharge status: Home / Self Care | Payer: PPO | Source: Ambulatory Visit | Attending: Hematology | Admitting: Hematology

## 2020-08-13 DIAGNOSIS — N281 Cyst of kidney, acquired: Secondary | ICD-10-CM | POA: Diagnosis not present

## 2020-08-13 DIAGNOSIS — C8339 Diffuse large B-cell lymphoma, extranodal and solid organ sites: Secondary | ICD-10-CM | POA: Diagnosis not present

## 2020-08-13 DIAGNOSIS — I2510 Atherosclerotic heart disease of native coronary artery without angina pectoris: Secondary | ICD-10-CM | POA: Diagnosis not present

## 2020-08-13 DIAGNOSIS — Z0000 Encounter for general adult medical examination without abnormal findings: Secondary | ICD-10-CM

## 2020-08-13 DIAGNOSIS — T380X5A Adverse effect of glucocorticoids and synthetic analogues, initial encounter: Secondary | ICD-10-CM | POA: Insufficient documentation

## 2020-08-13 DIAGNOSIS — C8590 Non-Hodgkin lymphoma, unspecified, unspecified site: Secondary | ICD-10-CM | POA: Diagnosis not present

## 2020-08-13 DIAGNOSIS — Z5111 Encounter for antineoplastic chemotherapy: Secondary | ICD-10-CM

## 2020-08-13 DIAGNOSIS — R739 Hyperglycemia, unspecified: Secondary | ICD-10-CM | POA: Diagnosis not present

## 2020-08-13 DIAGNOSIS — J9811 Atelectasis: Secondary | ICD-10-CM | POA: Diagnosis not present

## 2020-08-13 LAB — GLUCOSE, CAPILLARY: Glucose-Capillary: 97 mg/dL (ref 70–99)

## 2020-08-13 MED ORDER — FLUDEOXYGLUCOSE F - 18 (FDG) INJECTION
9.6000 | Freq: Once | INTRAVENOUS | Status: AC | PRN
  Administered 2020-08-13: 9.6 via INTRAVENOUS

## 2020-08-13 NOTE — Progress Notes (Signed)
Subjective:   Antonio [name] is a 79 y.o. male who presents for Medicare Annual/Subsequent preventive examination.  Review of Systems     Cardiac Risk Factors include: advanced age (>10men, >65 women);male gender;dyslipidemia;hypertension     Objective:    There were no vitals filed for this visit. There is no height or weight on file to calculate BMI.  Advanced Directives 08/13/2020 06/18/2020 06/17/2020 05/28/2020 05/08/2020 05/07/2020 05/06/2020  Does Patient Have a Medical Advance Directive? Yes Yes Yes Yes Yes Yes Yes  Type of Advance Directive Out of facility DNR (pink MOST or yellow form) Out of facility DNR (pink MOST or yellow form) - - Out of facility DNR (pink MOST or yellow form) Healthcare Power of [place];Living will  Does patient want to make changes to medical advance directive? No - Patient declined No - Patient declined - No - Patient declined No - Patient declined - No - Patient declined  Copy of [place] in Chart? - - - - - No - copy requested No - copy requested  Pre-existing out of facility DNR order (yellow form or pink MOST form) Pink MOST form placed in chart (order not valid for inpatient use);Yellow form placed in chart (order not valid for inpatient use) - - - - - -    Current Medications (verified) Outpatient Encounter Medications as of 08/13/2020  Medication Sig  . calcium carbonate (TUMS - DOSED IN MG ELEMENTAL CALCIUM) 500 MG chewable tablet Chew 1 tablet by mouth as needed for indigestion or heartburn.  . Cholecalciferol (VITAMIN D3) 2000 units TABS Take 1 tablet by mouth daily.  . Cyanocobalamin (B-12 PO) Take 1 tablet by mouth daily.  . diphenhydramine-acetaminophen (TYLENOL PM) 25-500 MG TABS tablet Take 1 tablet by mouth at bedtime as needed.  Marland Kitchen Ketotifen Fumarate (REFRESH EYE ITCH RELIEF OP) Place 1 drop into both eyes daily as needed (itchy eyes).   Marland Kitchen losartan (COZAAR) 100 MG tablet Take 1 tablet (100 mg  total) by mouth daily.  . meloxicam (MOBIC) 7.5 MG tablet TAKE 1 TABLET (7.5 MG TOTAL) BY MOUTH 2 (TWO) TIMES DAILY AS NEEDED FOR PAIN. (Patient taking differently: Take 7.5 mg by mouth daily. )  . Multiple Vitamins-Minerals (PRESERVISION AREDS 2 PO) Take 1 capsule by mouth 2 (two) times daily.   . simvastatin (ZOCOR) 40 MG tablet TAKE 1 TABLET BY MOUTH EVERY DAY  . Testosterone 20.25 MG/ACT (1.62%) GEL SMARTSIG:40.5 Milligram(s) Topical Every Morning  . valACYclovir (VALTREX) 1000 MG tablet Take one tablet by mouth twice daily for 3 days as needed for flares.  . [DISCONTINUED] lidocaine-prilocaine (EMLA) cream   . [DISCONTINUED] ondansetron (ZOFRAN) 8 MG tablet Take 1 tablet (8 mg total) by mouth every 8 (eight) hours as needed for nausea or vomiting. (Patient not taking: Reported on 03/30/2020)  . [DISCONTINUED] predniSONE (DELTASONE) 20 MG tablet Take 3 tablets (60 mg total) by mouth daily with breakfast. For 5 days with each cycle of chemotherapy  . [DISCONTINUED] prochlorperazine (COMPAZINE) 10 MG tablet Take 1 tablet (10 mg total) by mouth every 6 (six) hours as needed for nausea or vomiting. (Patient not taking: Reported on 03/30/2020)   No facility-administered encounter medications on file as of 08/13/2020.    Allergies (verified) Latex   History: Past Medical History:  Diagnosis Date  . Age-related macular degeneration, dry, both eyes   . 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 Carotid stenosis, asymptomatic, left ?  . ED (erectile dysfunction)   . Elevated diaphragm    right  . Erectile dysfunction   . History of basal cell carcinoma (BCC) excision    multiple excision in office  . History of kidney stones   . History of seizure    03-03-2020 per pt x1 while taking interferan for  melanoma in 2001 ,  none since, told a side effect of interferan  . History of squamous cell carcinoma excision    multiple skin excision in office  . Hyperlipidemia   . Hypertension    followed by pcp  (03-03-2020 per pt had a stress test approx. 2010, told normal)  . LBBB (left bundle branch block)   . Lymphoma of testis ([facility]) 2000   s/p right orchiectomy   . Mass of left testis   . MGUS (monoclonal gammopathy of unknown significance)    oncologist--- dr Irene Limbo  . Personal history of malignant melanoma of skin dermatologist--- dr Wilhemina Bonito   dx 2001 left calf area  s/p WLE ,  completed one year interferan (03-03-2020 pt states did not involve lymph nodes, no recurrence)  . Urgency of urination   . Wears hearing aid in both ears    Past Surgical History:  Procedure Laterality Date  . CATARACT EXTRACTION W/ INTRAOCULAR LENS IMPLANT Bilateral 2018  . IR IMAGING GUIDED PORT INSERTION  03/31/2020  . MELANOMA EXCISION  10/1999  . ORCHIECTOMY Right 2000  . ORCHIECTOMY Left 03/05/2020   Procedure: ORCHIECTOMY;  Surgeon: [name], MD;  Location: [facility];  Service: Urology;  Laterality: Left;  . TONSILLECTOMY  child  . TOTAL KNEE ARTHROPLASTY Right 06/2009   Family History  Problem Relation Age of Onset  . Cataracts Mother   . Transient ischemic attack Mother   . Asthma Mother   . Cataracts Father   . Diabetes Father        borderline  . Hypertension Father   . Hyperlipidemia Father   . Hyperlipidemia Son   . Hypertension Son   . Amblyopia Neg Hx   . Blindness Neg Hx   . Glaucoma Neg Hx   . Macular degeneration Neg Hx   . Retinal detachment Neg Hx   . Strabismus Neg Hx   . Retinitis pigmentosa Neg Hx    Social History   Socioeconomic History  . Marital status: Married    Spouse name: Not on file  . Number of children: Not on file  . Years of education: Not on file  . Highest education level: Not on file  Occupational History  . Not on file  Tobacco  Use  . Smoking status: Never Smoker  . Smokeless tobacco: Never Used  Vaping Use  . Vaping Use: Never used  Substance and Sexual Activity  . Alcohol use: Yes    Alcohol/week: 14.0 standard drinks    Types: 14 Glasses of wine per week    Comment: 2 wine daily  . Drug use: Never  . Sexual activity: Not on file  Other Topics Concern  . Not on file  Social History Narrative   Social History      Diet?good      Do you drink/eat things with caffeine? seldom      Marital status?          yes  What year were you married? 1962      Do you live in a house, apartment, assisted living, condo, trailer, etc.? house      Is it one or more stories?1      How many persons live in your home? 2      Do you have any pets in your home? (please list) no      Highest level of education completed? college      Current or past profession: president and [employer]      Do you exercise?   yes                                   Type & how often? [employer] work, Teacher, adult education care      Do you have a living will? yes      Do you have a DNR form?       yes                            If not, do you want to discuss one?      Do you have signed POA/HPOA for forms?  yes      Functional Status      Do you have difficulty bathing or dressing yourself? no      Do you have difficulty preparing food or eating? no      Do you have difficulty managing your medications? no      Do you have difficulty managing your finances? no      Do you have difficulty affording your medications? no   Social Determinants of Health   Financial Resource Strain:   . Difficulty of Paying Living Expenses: Not on file  Food Insecurity:   . Worried About Charity fundraiser in the Last Year: Not on file  . Ran Out of Food in the Last Year: Not on file  Transportation Needs:   . Lack of Transportation (Medical): Not on file  . Lack of Transportation (Non-Medical): Not on file   Physical Activity:   . Days of Exercise per Week: Not on file  . Minutes of Exercise per Session: Not on file  Stress:   . Feeling of Stress : Not on file  Social Connections:   . Frequency of Communication with Friends and Family: Not on file  . Frequency of Social Gatherings with Friends and Family: Not on file  . Attends Religious Services: Not on file  . Active Member of Clubs or Organizations: Not on file  . Attends Archivist Meetings: Not on file  . Marital Status: Not on file    Tobacco Counseling Counseling given: Not Answered   Clinical Intake:  Pre-visit preparation completed: Yes  Pain : No/denies pain     BMI - recorded: 26 Nutritional Status: BMI 25 -29 Overweight Nutritional Risks: None Diabetes: No  How often do you need to have someone help you when you read instructions, pamphlets, or other written materials from your doctor or pharmacy?: 1 - Never  Diabetic?no         Activities of Daily Living In your present state of health, do you have any difficulty performing the following activities: 08/13/2020 03/05/2020  Hearing? Y N  Vision? N N  Difficulty concentrating or making decisions? N N  Walking or  climbing stairs? N N  Dressing or bathing? N N  Doing errands, shopping? N -  Preparing Food and eating ? N -  Using the Toilet? N -  In the past six months, have you accidently leaked urine? N -  Do you have problems with loss of bowel control? N -  Managing your Medications? N -  Managing your Finances? N -  Housekeeping or managing your Housekeeping? N -  Some recent data might be hidden    Patient Care Team: [name], DO as PCP - General (Geriatric Medicine)  Indicate any recent Medical Services you may have received from other than [facility] providers in the past year (date may be approximate).     Assessment:   This is a routine wellness examination for Antonio [name].  Hearing/Vision screen  Hearing Screening   125Hz   250Hz  500Hz  1000Hz  2000Hz  3000Hz  4000Hz  6000Hz  8000Hz   Right ear:           Left ear:           Comments: Patient states he has hearing problems. He wears hearing aids.  Vision Screening Comments: Patient states that he has no vision problems. Patient wears reading glasses. Patient had eye exam about 2 months ago.  Dietary issues and exercise activities discussed: Current Exercise Habits: The patient does not participate in regular exercise at present, Exercise limited by: Other - see comments (chemo)  Goals    . Increase physical activity     3 days a week of weights and walking other days    . Patient Stated     Pt would like to maintain current weight.       Depression Screen PHQ 2/9 Scores 08/13/2020 03/11/2020 08/12/2019 04/05/2019 03/06/2019 09/05/2018 02/28/2018  PHQ - 2 Score 0 0 0 0 0 0 0    Fall Risk Fall Risk  08/13/2020 03/11/2020 08/12/2019 04/05/2019 03/26/2019  Falls in the past year? 0 0 1 1 1   Number falls in past yr: 0 0 1 0 0  Injury with Fall? 0 0 0 1 1  Comment - - - - Slipped on rocks, injured left side of back     Any stairs in or around the home? No  If so, are there any without handrails? No  Home free of loose throw rugs in walkways, pet beds, electrical cords, etc? Yes  Adequate lighting in your home to reduce risk of falls? Yes   ASSISTIVE DEVICES UTILIZED TO PREVENT FALLS:  Life alert? No  Use of a cane, walker or w/c? No  Grab bars in the bathroom? Yes  Shower chair or bench in shower? No  Elevated toilet seat or a handicapped toilet? No   TIMED UP AND GO:  Was the test performed? No .    Cognitive Function: MMSE - Mini Mental State Exam 02/13/2018  Orientation to time 3  Orientation to Place 5  Registration 3  Attention/ Calculation 5  Recall 2  Language- name 2 objects 2  Language- repeat 1  Language- follow 3 step command 3  Language- read & follow direction 1  Write a sentence 1  Copy design 1  Total score 27     6CIT Screen  08/13/2020 08/12/2019  What Year? 0 points 0 points  What month? 0 points 0 points  What time? 0 points 0 points  Count back from 20 0 points 0 points  Months in reverse 0 points 0 points  Repeat phrase 0 points 2 points  Total Score 0 2    Immunizations Immunization History  Administered Date(s) Administered  . Influenza, High Dose Seasonal PF 07/15/2017, 07/16/2018, 06/11/2019, 07/13/2020  . Moderna SARS-COVID-2 Vaccination 10/29/2019, 11/26/2019, 06/11/2020  . Pneumococcal Conjugate-13 03/04/2014  . Pneumococcal Polysaccharide-23 07/27/2010  . Tdap 03/04/2014  . Zoster 09/22/2006  . Zoster Recombinat (Shingrix) 10/06/2017, 12/09/2017    TDAP status: Up to date Flu Vaccine status: Up to date Pneumococcal vaccine status: Up to date Covid-19 vaccine status: Completed vaccines  Qualifies for Shingles Vaccine? Yes   Zostavax completed Yes   Shingrix Completed?: Yes  Screening Tests Health Maintenance  Topic Date Due  . TETANUS/TDAP  03/04/2024  . INFLUENZA VACCINE  Completed  . COVID-19 Vaccine  Completed  . PNA vac Low Risk Adult  Completed    Health Maintenance  There are no preventive care reminders to display for this patient.  Colorectal cancer screening: No longer required.   Lung Cancer Screening: (Low Dose CT Chest recommended if Age 48-80 years, 30 pack-year currently smoking OR have quit w/in 15years.) does not qualify.   Lung Cancer Screening Referral: na  Additional Screening:  Hepatitis C Screening: does not qualify; Completed na  Vision Screening: Recommended annual ophthalmology exams for early detection of glaucoma and other disorders of the eye. Is the patient up to date with their annual eye exam?  Yes  Who is the provider or what is the name of the office in which the patient attends annual eye exams? Dr Coralyn Pear If pt is not established with a provider, would they like to be referred to a provider to establish care? No .   Dental Screening:  Recommended annual dental exams for proper oral hygiene  Community Resource Referral / Chronic Care Management: CRR required this visit?  No   CCM required this visit?  No      Plan:     I have personally reviewed and noted the following in the patient's chart:   . Medical and social history . Use of alcohol, tobacco or illicit drugs  . Current medications and supplements . Functional ability and status . Nutritional status . Physical activity . Advanced directives . List of other physicians . Hospitalizations, surgeries, and ER visits in previous 12 months . Vitals . Screenings to include cognitive, depression, and falls . Referrals and appointments  In addition, I have reviewed and discussed with patient certain preventive protocols, quality metrics, and best practice recommendations. A written personalized care plan for preventive services as well as general preventive health recommendations were provided to patient.     Lauree Chandler, NP   08/13/2020    Virtual Visit via Telephone Note  I connected with@ on 08/13/20 at  1:00 PM EDT by telephone and verified that I am speaking with the correct person using two identifiers.  Location: Patient: home Provider: [facility]   I discussed the limitations, risks, security and privacy concerns of performing an evaluation and management service by telephone and the availability of in person appointments. I also discussed with the patient that there may be a patient responsible charge related to this service. The patient expressed understanding and agreed to proceed.   I discussed the assessment and treatment plan with the patient. The patient was provided an opportunity to ask questions and all were answered. The patient agreed with the plan and demonstrated an understanding of the instructions.   The patient was advised to call back or seek an in-person evaluation if the symptoms worsen or if  the condition fails to improve as  anticipated.  I provided 16 minutes of non-face-to-face time during this encounter.  Carlos American. Harle Battiest Avs printed and mailed

## 2020-08-13 NOTE — Patient Instructions (Addendum)
Mr. Antonio [name] , Thank you for taking time to come for your Medicare Wellness Visit. I appreciate your ongoing commitment to your health goals. Please review the following plan we discussed and let me know if I can assist you in the future.   Screening recommendations/referrals: Colonoscopy aged out Recommended yearly ophthalmology/optometry visit for glaucoma screening and checkup Recommended yearly dental visit for hygiene and checkup  Vaccinations: Influenza vaccine up to date Pneumococcal vaccine up to date Tdap vaccine up to date Shingles vaccine up to date    Advanced directives: on file.   Conditions/risks identified: advanced age, hypertension, hyperlipidemia.   Next appointment: 1 year.   Preventive Care 14 Years and Older, Male Preventive care refers to lifestyle choices and visits with your health care provider that can promote health and wellness. What does preventive care include?  A yearly physical exam. This is also called an annual well check.  Dental exams once or twice a year.  Routine eye exams. Ask your health care provider how often you should have your eyes checked.  Personal lifestyle choices, including:  Daily care of your teeth and gums.  Regular physical activity.  Eating a healthy diet.  Avoiding tobacco and drug use.  Limiting alcohol use.  Practicing safe sex.  Taking low doses of aspirin every day.  Taking vitamin and mineral supplements as recommended by your health care provider. What happens during an annual well check? The services and screenings done by your health care provider during your annual well check will depend on your age, overall health, lifestyle risk factors, and family history of disease. Counseling  Your health care provider may ask you questions about your:  Alcohol use.  Tobacco use.  Drug use.  Emotional well-being.  Home and relationship well-being.  Sexual activity.  Eating habits.  History of  falls.  Memory and ability to understand (cognition).  Work and work Statistician. Screening  You may have the following tests or measurements:  Height, weight, and BMI.  Blood pressure.  Lipid and cholesterol levels. These may be checked every 5 years, or more frequently if you are over 58 years old.  Skin check.  Lung cancer screening. You may have this screening every year starting at age 16 if you have a 30-pack-year history of smoking and currently smoke or have quit within the past 15 years.  Fecal occult blood test (FOBT) of the stool. You may have this test every year starting at age 55.  Flexible sigmoidoscopy or colonoscopy. You may have a sigmoidoscopy every 5 years or a colonoscopy every 10 years starting at age 26.  Prostate cancer screening. Recommendations will vary depending on your family history and other risks.  Hepatitis C blood test.  Hepatitis B blood test.  Sexually transmitted disease (STD) testing.  Diabetes screening. This is done by checking your blood sugar (glucose) after you have not eaten for a while (fasting). You may have this done every 1-3 years.  Abdominal aortic aneurysm (AAA) screening. You may need this if you are a current or former smoker.  Osteoporosis. You may be screened starting at age 61 if you are at high risk. Talk with your health care provider about your test results, treatment options, and if necessary, the need for more tests. Vaccines  Your health care provider may recommend certain vaccines, such as:  Influenza vaccine. This is recommended every year.  Tetanus, diphtheria, and acellular pertussis (Tdap, Td) vaccine. You may need a Td booster every 10 years.  Zoster vaccine. You may need this after age 91.  Pneumococcal 13-valent conjugate (PCV13) vaccine. One dose is recommended after age 67.  Pneumococcal polysaccharide (PPSV23) vaccine. One dose is recommended after age 54. Talk to your health care provider about  which screenings and vaccines you need and how often you need them. This information is not intended to replace advice given to you by your health care provider. Make sure you discuss any questions you have with your health care provider. Document Released: 10/30/2015 Document Revised: 06/22/2016 Document Reviewed: 08/04/2015 Elsevier Interactive Patient Education  2017 [place] Prevention in the Home Falls can cause injuries. They can happen to people of all ages. There are many things you can do to make your home safe and to help prevent falls. What can I do on the outside of my home?  Regularly fix the edges of walkways and driveways and fix any cracks.  Remove anything that might make you trip as you walk through a door, such as a raised step or threshold.  Trim any bushes or trees on the path to your home.  Use bright outdoor lighting.  Clear any walking paths of anything that might make someone trip, such as rocks or tools.  Regularly check to see if handrails are loose or broken. Make sure that both sides of any steps have handrails.  Any raised decks and porches should have guardrails on the edges.  Have any leaves, snow, or ice cleared regularly.  Use sand or salt on walking paths during winter.  Clean up any spills in your garage right away. This includes oil or grease spills. What can I do in the bathroom?  Use night lights.  Install grab bars by the toilet and in the tub and shower. Do not use towel bars as grab bars.  Use non-skid mats or decals in the tub or shower.  If you need to sit down in the shower, use a plastic, non-slip stool.  Keep the floor dry. Clean up any water that spills on the floor as soon as it happens.  Remove soap buildup in the tub or shower regularly.  Attach bath mats securely with double-sided non-slip rug tape.  Do not have throw rugs and other things on the floor that can make you trip. What can I do in the  bedroom?  Use night lights.  Make sure that you have a light by your bed that is easy to reach.  Do not use any sheets or blankets that are too big for your bed. They should not hang down onto the floor.  Have a firm chair that has side arms. You can use this for support while you get dressed.  Do not have throw rugs and other things on the floor that can make you trip. What can I do in the kitchen?  Clean up any spills right away.  Avoid walking on wet floors.  Keep items that you use a lot in easy-to-reach places.  If you need to reach something above you, use a strong step stool that has a grab bar.  Keep electrical cords out of the way.  Do not use floor polish or wax that makes floors slippery. If you must use wax, use non-skid floor wax.  Do not have throw rugs and other things on the floor that can make you trip. What can I do with my stairs?  Do not leave any items on the stairs.  Make sure that there are  handrails on both sides of the stairs and use them. Fix handrails that are broken or loose. Make sure that handrails are as long as the stairways.  Check any carpeting to make sure that it is firmly attached to the stairs. Fix any carpet that is loose or worn.  Avoid having throw rugs at the top or bottom of the stairs. If you do have throw rugs, attach them to the floor with carpet tape.  Make sure that you have a light switch at the top of the stairs and the bottom of the stairs. If you do not have them, ask someone to add them for you. What else can I do to help prevent falls?  Wear shoes that:  Do not have high heels.  Have rubber bottoms.  Are comfortable and fit you well.  Are closed at the toe. Do not wear sandals.  If you use a stepladder:  Make sure that it is fully opened. Do not climb a closed stepladder.  Make sure that both sides of the stepladder are locked into place.  Ask someone to hold it for you, if possible.  Clearly mark and make  sure that you can see:  Any grab bars or handrails.  First and last steps.  Where the edge of each step is.  Use tools that help you move around (mobility aids) if they are needed. These include:  Canes.  Walkers.  Scooters.  Crutches.  Turn on the lights when you go into a dark area. Replace any light bulbs as soon as they burn out.  Set up your furniture so you have a clear path. Avoid moving your furniture around.  If any of your floors are uneven, fix them.  If there are any pets around you, be aware of where they are.  Review your medicines with your doctor. Some medicines can make you feel dizzy. This can increase your chance of falling. Ask your doctor what other things that you can do to help prevent falls. This information is not intended to replace advice given to you by your health care provider. Make sure you discuss any questions you have with your health care provider. Document Released: 07/30/2009 Document Revised: 03/10/2016 Document Reviewed: 11/07/2014 Elsevier Interactive Patient Education  2017 [employer].

## 2020-08-13 NOTE — Progress Notes (Signed)
This service is provided via telemedicine  No vital signs collected/recorded due to the encounter was a telemedicine visit.   Location of patient (ex: home, work):  Home  Patient consents to a telephone visit:  Yes, see encounter dated 08/13/2020  Location of the provider (ex: office, home):  [facility]  Name of any referring provider:  Hollace Kinnier, DO  Names of all persons participating in the telemedicine service and their role in the encounter:  Sherrie Mustache, Nurse Practitioner, Carroll Kinds, CMA, and patient.   Time spent on call:  10 minutes with medical assistant

## 2020-08-13 NOTE — Telephone Encounter (Signed)
[name], vorhees are scheduled for a virtual visit with your provider today.    Just as we do with appointments in the office, we must obtain your consent to participate.  Your consent will be active for this visit and any virtual visit you may have with one of our providers in the next 365 days.    If you have a MyChart account, I can also send a copy of this consent to you electronically.  All virtual visits are billed to your insurance company just like a traditional visit in the office.  As this is a virtual visit, video technology does not allow for your provider to perform a traditional examination.  This may limit your provider's ability to fully assess your condition.  If your provider identifies any concerns that need to be evaluated in person or the need to arrange testing such as labs, EKG, etc, we will make arrangements to do so.    Although advances in technology are sophisticated, we cannot ensure that it will always work on either your end or our end.  If the connection with a video visit is poor, we may have to switch to a telephone visit.  With either a video or telephone visit, we are not always able to ensure that we have a secure connection.   I need to obtain your verbal consent now.   Are you willing to proceed with your visit today?   Antonio [name] has provided verbal consent on 08/13/2020 for a virtual visit (video or telephone).   Carroll Kinds, CMA 08/13/2020  1:37 PM

## 2020-08-18 IMAGING — US IR IMAGING GUIDED PORT INSERTION
1 series · 2 of 2 positions shown · non-contrast
Comparison: none

CLINICAL DATA: Testicular lymphoma, needs durable venous access for
planned chemotherapy regimen
TECHNIQUE: The procedure, risks, benefits, and alternatives were explained to
the patient. Questions regarding the procedure were encouraged and
answered. The patient understands and consents to the procedure.

[Series 1: ir imaging guided port insertion · 2 of 2 slices shown]
[im 1/2]
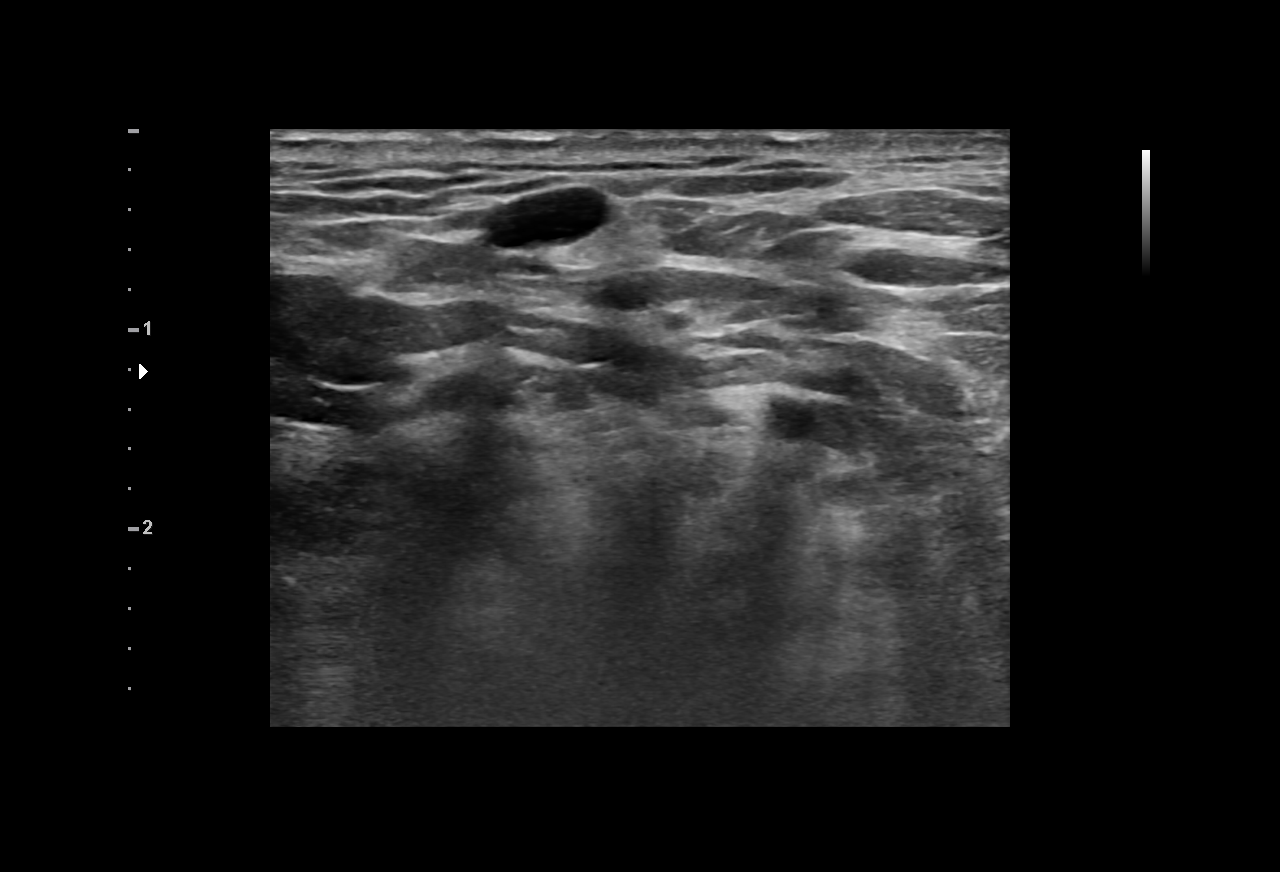
[im 2/2]
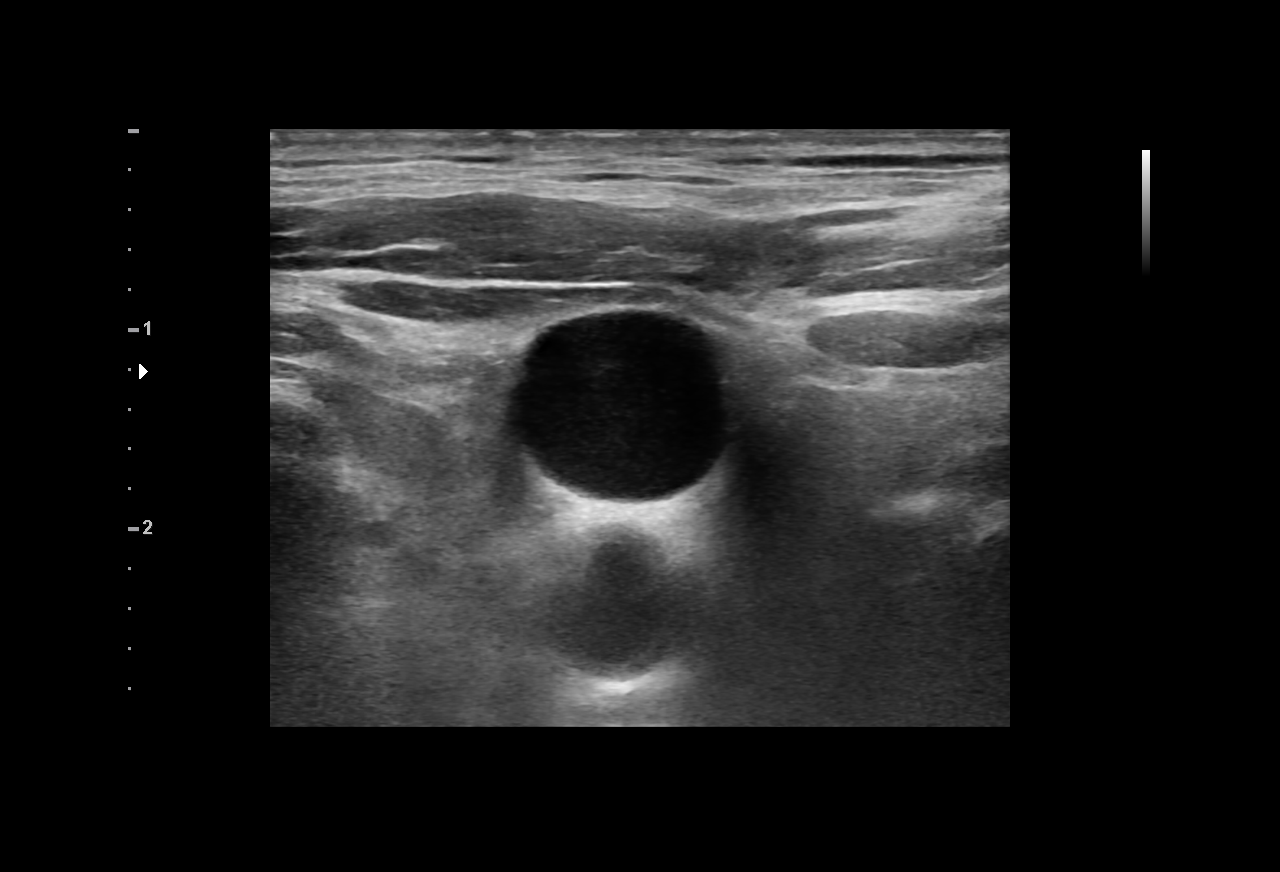

[2 of 2 positions shown; findings below may reference images not displayed]

EXAM:
TUNNELED PORT CATHETER PLACEMENT WITH ULTRASOUND AND FLUOROSCOPIC
GUIDANCE

FLUOROSCOPY TIME:  6 seconds; 1 mGy

ANESTHESIA/SEDATION:
Intravenous Fentanyl 055mcg and Versed 2mg were administered as
conscious sedation during continuous monitoring of the patient's
level of consciousness and physiological / cardiorespiratory status
by the radiology RN, with a total moderate sedation time of 18
minutes.
As antibiotic prophylaxis, cefazolin 2 g was ordered pre-procedure
and administered intravenously within one hour of incision.

Patency of the right IJ vein was confirmed with ultrasound with
image documentation. An appropriate skin site was determined. Skin
site was marked. Region was prepped using maximum barrier technique
including cap and mask, sterile gown, sterile gloves, large sterile
sheet, and Chlorhexidine as cutaneous antisepsis. The region was
infiltrated locally with 1% lidocaine. Under real-time ultrasound
guidance, the right IJ vein was accessed with a 21 gauge
micropuncture needle; the needle tip within the vein was confirmed
with ultrasound image documentation. Needle was exchanged over a 018
guidewire for transitional dilator, and vascular measurement was
performed.

A small incision was made on the right anterior chest wall and a
subcutaneous pocket fashioned. The power-injectable port was
positioned and its catheter tunneled to the right IJ dermatotomy
site. The transitional dilator was exchanged over an Amplatz wire
for a peel-away sheath, through which the port catheter, which had
been trimmed to the appropriate length, was advanced and positioned
under fluoroscopy with its tip at the cavoatrial junction. Spot
chest radiograph confirms good catheter position and no
pneumothorax. The port was flushed per protocol. The pocket was
closed with deep interrupted and subcuticular continuous 3-0
Monocryl sutures. The incisions were covered with Dermabond then
covered with a sterile dressing.

The patient tolerated the procedure well.

COMPLICATIONS:
COMPLICATIONS
None immediate
IMPRESSION: Technically successful right IJ power-injectable port catheter
placement. Ready for routine use.

## 2020-08-19 ENCOUNTER — Inpatient Hospital Stay: Payer: PPO | Attending: Hematology

## 2020-08-19 ENCOUNTER — Inpatient Hospital Stay (HOSPITAL_BASED_OUTPATIENT_CLINIC_OR_DEPARTMENT_OTHER): Payer: PPO | Admitting: Hematology

## 2020-08-19 ENCOUNTER — Other Ambulatory Visit: Payer: Self-pay

## 2020-08-19 VITALS — BP 135/89 | HR 87 | Temp 97.2°F | Resp 16 | Ht 72.0 in | Wt 193.8 lb

## 2020-08-19 DIAGNOSIS — Z823 Family history of stroke: Secondary | ICD-10-CM | POA: Insufficient documentation

## 2020-08-19 DIAGNOSIS — C8339 Diffuse large B-cell lymphoma, extranodal and solid organ sites: Secondary | ICD-10-CM | POA: Insufficient documentation

## 2020-08-19 DIAGNOSIS — Z836 Family history of other diseases of the respiratory system: Secondary | ICD-10-CM | POA: Insufficient documentation

## 2020-08-19 DIAGNOSIS — I700 Atherosclerosis of aorta: Secondary | ICD-10-CM | POA: Diagnosis not present

## 2020-08-19 DIAGNOSIS — K5900 Constipation, unspecified: Secondary | ICD-10-CM | POA: Diagnosis not present

## 2020-08-19 DIAGNOSIS — Z8249 Family history of ischemic heart disease and other diseases of the circulatory system: Secondary | ICD-10-CM | POA: Diagnosis not present

## 2020-08-19 DIAGNOSIS — I10 Essential (primary) hypertension: Secondary | ICD-10-CM | POA: Insufficient documentation

## 2020-08-19 DIAGNOSIS — Z8349 Family history of other endocrine, nutritional and metabolic diseases: Secondary | ICD-10-CM | POA: Insufficient documentation

## 2020-08-19 DIAGNOSIS — Z79899 Other long term (current) drug therapy: Secondary | ICD-10-CM | POA: Insufficient documentation

## 2020-08-19 DIAGNOSIS — D472 Monoclonal gammopathy: Secondary | ICD-10-CM | POA: Insufficient documentation

## 2020-08-19 DIAGNOSIS — Z833 Family history of diabetes mellitus: Secondary | ICD-10-CM | POA: Diagnosis not present

## 2020-08-19 DIAGNOSIS — Z7289 Other problems related to lifestyle: Secondary | ICD-10-CM | POA: Insufficient documentation

## 2020-08-19 DIAGNOSIS — M25562 Pain in left knee: Secondary | ICD-10-CM | POA: Diagnosis not present

## 2020-08-19 DIAGNOSIS — Z5111 Encounter for antineoplastic chemotherapy: Secondary | ICD-10-CM

## 2020-08-19 DIAGNOSIS — Z83518 Family history of other specified eye disorder: Secondary | ICD-10-CM | POA: Diagnosis not present

## 2020-08-19 LAB — CBC WITH DIFFERENTIAL/PLATELET
Abs Immature Granulocytes: 0.01 10*3/uL (ref 0.00–0.07)
Basophils Absolute: 0.1 10*3/uL (ref 0.0–0.1)
Basophils Relative: 1 %
Eosinophils Absolute: 0.2 10*3/uL (ref 0.0–0.5)
Eosinophils Relative: 3 %
HCT: 42.6 % (ref 39.0–52.0)
Hemoglobin: 13.4 g/dL (ref 13.0–17.0)
Immature Granulocytes: 0 %
Lymphocytes Relative: 16 %
Lymphs Abs: 0.9 10*3/uL (ref 0.7–4.0)
MCH: 32.9 pg (ref 26.0–34.0)
MCHC: 31.5 g/dL (ref 30.0–36.0)
MCV: 104.7 fL — ABNORMAL HIGH (ref 80.0–100.0)
Monocytes Absolute: 0.5 10*3/uL (ref 0.1–1.0)
Monocytes Relative: 9 %
Neutro Abs: 4.1 10*3/uL (ref 1.7–7.7)
Neutrophils Relative %: 71 %
Platelets: 206 10*3/uL (ref 150–400)
RBC: 4.07 MIL/uL — ABNORMAL LOW (ref 4.22–5.81)
RDW: 13.2 % (ref 11.5–15.5)
WBC: 5.8 10*3/uL (ref 4.0–10.5)
nRBC: 0 % (ref 0.0–0.2)

## 2020-08-19 LAB — CMP (CANCER CENTER ONLY)
ALT: 27 U/L (ref 0–44)
AST: 20 U/L (ref 15–41)
Albumin: 4.1 g/dL (ref 3.5–5.0)
Alkaline Phosphatase: 68 U/L (ref 38–126)
Anion gap: 8 (ref 5–15)
BUN: 19 mg/dL (ref 8–23)
CO2: 28 mmol/L (ref 22–32)
Calcium: 9.1 mg/dL (ref 8.9–10.3)
Chloride: 108 mmol/L (ref 98–111)
Creatinine: 0.86 mg/dL (ref 0.61–1.24)
GFR, Estimated: >60 mL/min (ref >60)
Glucose, Bld: 129 mg/dL — ABNORMAL HIGH (ref 70–99)
Potassium: 4.1 mmol/L (ref 3.5–5.1)
Sodium: 144 mmol/L (ref 135–145)
Total Bilirubin: 0.7 mg/dL (ref 0.3–1.2)
Total Protein: 6.7 g/dL (ref 6.5–8.1)

## 2020-08-19 LAB — LACTATE DEHYDROGENASE: LDH: 128 U/L (ref 98–192)

## 2020-08-19 NOTE — Progress Notes (Signed)
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 male who has been referred to [place] by Dr. Hollace Kinnier for evaluation and management of Bone lesion of left lower leg. The pt reports that he is doing well overall.   The pt reports that he cannot feel the ovoid finding from his 04/09/18 MRI as noted below. He denies being able to feel anything in his skin as well. He notes that he received a needle injection in his knee prior to this MRI. The pt notes that his knee has lost much cartilage and is considering a knee replacement. He denies any recent injuries to his knee or falls. He has contacted Dr. Alphonsa Overall in surgery and another surgeon as well and will be making an appointment.   The pt notes that he does not feel any differently recently as compared to 6 months to a year ago. He does note that he normally has constipation in the spring, and has continued to have mild constipation. He denies any other concerns or symptoms.   The pt previously saw my colleague Dr. Lurline Del for primary testicular B-cell lymphoma in 2000. The pt notes that the involvement was limited to only one testicle. He received 6 intrathecal treatments in his spine, and received 4 cycles of chemotherapy (thinks this was CHOP) and radiation as well. The pt was followed by an oncologist for several years at the [facility] in [place] after moving from [place] after his [place] treatment.   The pt notes that he was first diagnosed with melanoma in October 2000. He noticed a strange spot on his left leg that was surgically resected. The pt's personal notes report a 2.9cm thick, level 4, no LN involvement, high risk stage  II, treated with Interferon three times a week for one year. He notes some squamous cell and basal cell involvements as well and sees a dermatologist, Dr. Wilhemina Bonito, regularly.   Of note prior to the patient's visit today, pt has had MRI Left Knee completed on 04/09/18 with results revealing An ovoid, lobulated, 30m T2 hyperintense focus within the medial subcutaneous far 1.937mdistal to the joint demonstrates demonstrates nonspecific imaging characteristics.   Most recent lab results (03/01/18) of CBC w/diff is as follows: all values are WNL except for RBC at 4.54, MCV at 100.4, MCH at 33.8.   On review of systems, pt reports left knee pain, mild constipation, and denies new or concerning symptoms, noticing any new lumps or bumps, pain along the spine, abdominal pains, problems passing urine, testicular pain/swelling, and any other symptoms.   Interval History:   Antonio KROHeturns today for management and evaluation evaluation of Left Primary Testicular Large B-cell Lymphoma. The patient's last visit with [place] on 07/08/2020. The pt reports that he is doing well overall.  The pt reports that he has been feeling well and his hair is growing back. Pt has some residual fatigue. He notes a right-handed tremor that was present prior to his lymphoma recurrence and treatment, but has recently gotten worse. This tremor is worse when he completes his signature and the day after he drinks wine.   Of note since the patient's last visit, pt has  had PET/CT ([id number]) completed on 08/13/2020 with results revealing "No findings identified to suggest residual or recurrent lymphoma."  Lab results today (08/19/20) of CBC w/diff and CMP is as follows: all values are WNL except for RBC at 4.07, MCV at 104.7, Glucose at 129. 08/19/2020 LDH at 128  On review of systems, pt reports fatigue, tremor and denies tingling/numbness in hands/feet, rash, abdominal pain, change in scrotal skin and any other symptoms.    MEDICAL HISTORY:  Past Medical History:  Diagnosis Date  . Age-related macular degeneration, dry, both eyes   . B-cell lymphoma [facility]) oncologist--- dr Irene Limbo   dx 2000 primary right testicular diffuse large b-cell lymphoma;  s/p  right orchiectomy 2000, completed chemo, intrathcal chemo (spine injection's), and radiation to left testis in 2000  . Carotid stenosis, asymptomatic, left ?  . ED (erectile dysfunction)   . Elevated diaphragm    right  . Erectile dysfunction   . History of basal cell carcinoma (BCC) excision    multiple excision in office  . History of kidney stones   . History of seizure    03-03-2020 per pt x1 while taking interferan for melanoma in 2001 ,  none since, told a side effect of interferan  . History of squamous cell carcinoma excision    multiple skin excision in office  . Hyperlipidemia   . Hypertension    followed by pcp  (03-03-2020 per pt had a stress test approx. 2010, told normal)  . LBBB (left bundle branch block)   . Lymphoma of testis ([place]) 2000   s/p right orchiectomy   . Mass of left testis   . MGUS (monoclonal gammopathy of unknown significance)    oncologist--- dr Irene Limbo  . Personal history of malignant melanoma of skin dermatologist--- dr Wilhemina Bonito   dx 2001 left calf area  s/p WLE ,  completed one year interferan (03-03-2020 pt states did not involve lymph nodes, no recurrence)  . Urgency of urination   . Wears hearing aid in both ears     SURGICAL HISTORY: Past Surgical History:  Procedure Laterality Date  . CATARACT EXTRACTION W/ INTRAOCULAR LENS IMPLANT Bilateral 2018  . IR IMAGING GUIDED PORT INSERTION  03/31/2020  . MELANOMA EXCISION  10/1999  . ORCHIECTOMY Right 2000  . ORCHIECTOMY Left 03/05/2020   Procedure: ORCHIECTOMY;  Surgeon: [name], MD;  Location: [facility];  Service: Urology;  Laterality: Left;  . TONSILLECTOMY  child  . TOTAL KNEE ARTHROPLASTY Right 06/2009    SOCIAL HISTORY: Social History    Socioeconomic History  . Marital status: Married    Spouse name: Not on file  . Number of children: Not on file  . Years of education: Not on file  . Highest education level: Not on file  Occupational History  . Not on file  Tobacco Use  . Smoking status: Never Smoker  . Smokeless tobacco: Never Used  Vaping Use  . Vaping Use: Never used  Substance and Sexual Activity  . Alcohol use: Yes    Alcohol/week: 14.0 standard drinks    Types: 14 Glasses of wine per week    Comment: 2 wine daily  . Drug use: Never  . Sexual activity: Not on file  Other Topics Concern  . Not on file  Social History Narrative   Social History      Diet?good      Do you drink/eat things with caffeine? seldom      Marital status?  yes                          What year were you married? 1962      Do you live in a house, apartment, assisted living, condo, trailer, etc.? house      Is it one or more stories?1      How many persons live in your home? 2      Do you have any pets in your home? (please list) no      Highest level of education completed? college      Current or past profession: president and [employer]      Do you exercise?   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 yes      Do you have a DNR form?       yes                            If not, do you want to discuss one?      Do you have signed POA/HPOA for forms?  yes      Functional Status      Do you have difficulty bathing or dressing yourself? no      Do you have difficulty preparing food or eating? no      Do you have difficulty managing your medications? no      Do you have difficulty managing your finances? no      Do you have difficulty affording your medications? no   Social Determinants of Health   Financial Resource Strain:   . Difficulty of Paying Living Expenses: Not on file  Food Insecurity:    . Worried About Charity fundraiser in the Last Year: Not on file  . Ran Out of Food in the Last Year: Not on file  Transportation Needs:   . Lack of Transportation (Medical): Not on file  . Lack of Transportation (Non-Medical): Not on file  Physical Activity:   . Days of Exercise per Week: Not on file  . Minutes of Exercise per Session: Not on file  Stress:   . Feeling of Stress : Not on file  Social Connections:   . Frequency of Communication with Friends and Family: Not on file  . Frequency of Social Gatherings with Friends and Family: Not on file  . Attends Religious Services: Not on file  . Active Member of Clubs or Organizations: Not on file  . Attends Archivist Meetings: Not on file  . Marital Status: Not on file  Intimate Partner Violence:   . Fear of Current or Ex-Partner: Not on file  . Emotionally Abused: Not on file  . Physically Abused: Not on file  . Sexually Abused: Not on file    FAMILY HISTORY: Family History  Problem Relation Age of Onset  . Cataracts Mother   . Transient ischemic attack Mother   . Asthma Mother   . Cataracts Father   . Diabetes Father        borderline  . Hypertension Father   . Hyperlipidemia Father   .  Hyperlipidemia Son   . Hypertension Son   . Amblyopia Neg Hx   . Blindness Neg Hx   . Glaucoma Neg Hx   . Macular degeneration Neg Hx   . Retinal detachment Neg Hx   . Strabismus Neg Hx   . Retinitis pigmentosa Neg Hx     ALLERGIES:  is allergic to latex.  MEDICATIONS:  Current Outpatient Medications  Medication Sig Dispense Refill  . calcium carbonate (TUMS - DOSED IN MG ELEMENTAL CALCIUM) 500 MG chewable tablet Chew 1 tablet by mouth as needed for indigestion or heartburn.    . Cholecalciferol (VITAMIN D3) 2000 units TABS Take 1 tablet by mouth daily.    . Cyanocobalamin (B-12 PO) Take 1 tablet by mouth daily.    . diphenhydramine-acetaminophen (TYLENOL PM) 25-500 MG TABS tablet Take 1 tablet by mouth at bedtime  as needed.    Marland Kitchen Ketotifen Fumarate (REFRESH EYE ITCH RELIEF OP) Place 1 drop into both eyes daily as needed (itchy eyes).     Marland Kitchen losartan (COZAAR) 100 MG tablet Take 1 tablet (100 mg total) by mouth daily. 90 tablet 1  . meloxicam (MOBIC) 7.5 MG tablet TAKE 1 TABLET (7.5 MG TOTAL) BY MOUTH 2 (TWO) TIMES DAILY AS NEEDED FOR PAIN. (Patient taking differently: Take 7.5 mg by mouth daily. ) 180 tablet 0  . Multiple Vitamins-Minerals (PRESERVISION AREDS 2 PO) Take 1 capsule by mouth 2 (two) times daily.     . simvastatin (ZOCOR) 40 MG tablet TAKE 1 TABLET BY MOUTH EVERY DAY 90 tablet 1  . Testosterone 20.25 MG/ACT (1.62%) GEL SMARTSIG:40.5 Milligram(s) Topical Every Morning    . valACYclovir (VALTREX) 1000 MG tablet Take one tablet by mouth twice daily for 3 days as needed for flares. 30 tablet 1   No current facility-administered medications for this visit.    REVIEW OF SYSTEMS:   A 10+ POINT REVIEW OF SYSTEMS WAS OBTAINED including neurology, dermatology, psychiatry, cardiac, respiratory, lymph, extremities, GI, GU, Musculoskeletal, constitutional, breasts, reproductive, HEENT.  All pertinent positives are noted in the HPI.  All others are negative.   PHYSICAL EXAMINATION: ECOG PERFORMANCE STATUS: 0 - Asymptomatic VS reviewed - stable  Vitals:   08/19/20 0927  BP: 135/89  Pulse: 87  Resp: 16  Temp: (!) 97.2 F (36.2 C)  SpO2: 98%   Wt Readings from Last 3 Encounters:  08/19/20 193 lb 12.8 oz (87.9 kg)  06/16/20 193 lb 12.8 oz (87.9 kg)  05/27/20 194 lb 11.2 oz (88.3 kg)   Body mass index is 26.28 kg/m.    GENERAL:alert, in no acute distress and comfortable SKIN: no acute rashes, no significant lesions EYES: conjunctiva are pink and non-injected, sclera anicteric OROPHARYNX: MMM, no exudates, no oropharyngeal erythema or ulceration NECK: supple, no JVD LYMPH:  no palpable lymphadenopathy in the cervical, axillary or inguinal regions LUNGS: clear to auscultation b/l with normal  respiratory effort HEART: regular rate & rhythm ABDOMEN:  normoactive bowel sounds , non tender, not distended. No palpable hepatosplenomegaly.  Extremity: no pedal edema PSYCH: alert & oriented x 3 with fluent speech NEURO: no focal motor/sensory deficits  LABORATORY DATA:  I have reviewed the data as listed  . CBC Latest Ref Rng & Units 08/19/2020 07/08/2020 06/16/2020  WBC 4.0 - 10.5 K/uL 5.8 17.1(H) 11.9(H)  Hemoglobin 13.0 - 17.0 g/dL 13.4 12.5(L) 11.9(L)  Hematocrit 39 - 52 % 42.6 38.6(L) 37.2(L)  Platelets 150 - 400 K/uL 206 264 200   . CBC    Component  Value Date/Time   WBC 5.8 08/19/2020 0854   RBC 4.07 (L) 08/19/2020 0854   HGB 13.4 08/19/2020 0854   HGB 11.9 (L) 04/01/2020 1111   HCT 42.6 08/19/2020 0854   HCT 40.8 12/19/2019 1057   PLT 206 08/19/2020 0854   PLT 159 04/01/2020 1111   MCV 104.7 (H) 08/19/2020 0854   MCH 32.9 08/19/2020 0854   MCHC 31.5 08/19/2020 0854   RDW 13.2 08/19/2020 0854   LYMPHSABS 0.9 08/19/2020 0854   MONOABS 0.5 08/19/2020 0854   EOSABS 0.2 08/19/2020 0854   BASOSABS 0.1 08/19/2020 0854    . CMP Latest Ref Rng & Units 08/19/2020 07/08/2020 06/16/2020  Glucose 70 - 99 mg/dL 129(H) 104(H) 138(H)  BUN 8 - 23 mg/dL '19 18 17  ' Creatinine 0.61 - 1.24 mg/dL 0.86 0.75 0.83  Sodium 135 - 145 mmol/L 144 142 139  Potassium 3.5 - 5.1 mmol/L 4.1 4.3 4.2  Chloride 98 - 111 mmol/L 108 109 108  CO2 22 - 32 mmol/L '28 29 26  ' Calcium 8.9 - 10.3 mg/dL 9.1 8.8(L) 9.6  Total Protein 6.5 - 8.1 g/dL 6.7 6.6 6.4(L)  Total Bilirubin 0.3 - 1.2 mg/dL 0.7 0.6 0.4  Alkaline Phos 38 - 126 U/L 68 86 105  AST 15 - 41 U/L '20 19 17  ' ALT 0 - 44 U/L '27 27 22   ' 03/05/2020 Left Testicular Flow Pathology Report ([id number]):    03/05/2020 FISH Panel:    03/05/2020 Left Testicular Mass Surgical Pathology ([id number]):    Component     Latest Ref Rng & Units 12/19/2019  Folate, Hemolysate     Not Estab. ng/mL 390.0  HCT     37.5 - 51.0 % 40.8  Folate,  RBC     >498 ng/mL 956  Vitamin B12     180 - 914 pg/mL 882   03/01/18 CBC w/diff:   RADIOGRAPHIC STUDIES: I have personally reviewed the radiological images as listed and agreed with the findings in the report. NM PET Image Restag (PS) Skull Base To Thigh  Result Date: 08/13/2020 CLINICAL DATA:  Subsequent treatment strategy for lymphoma. EXAM: NUCLEAR MEDICINE PET SKULL BASE TO THIGH TECHNIQUE: 9.6 mCi F-18 FDG was injected intravenously. Full-ring PET imaging was performed from the skull base to thigh after the radiotracer. CT data was obtained and used for attenuation correction and anatomic localization. Fasting blood glucose: 97 mg/dl COMPARISON:  03/02/2020 FINDINGS: Mediastinal blood pool activity: SUV max 2.46 Liver activity: SUV max 3.44 NECK: No hypermetabolic lymph nodes in the neck. Incidental CT findings: none CHEST: No enlarged axillary, supraclavicular, mediastinal, or hilar lymph nodes. Incidental CT findings: Aortic atherosclerosis. Coronary artery atherosclerotic calcifications. Asymmetric elevation of right hemidiaphragm with subsegmental areas of atelectasis/scarring in the right base. ABDOMEN/PELVIS: No abnormal FDG uptake within the liver, pancreas, adrenal glands, or spleen. No FDG avid abdominopelvic lymph nodes. Status post left orchectomy. No signs of recurrent or residual local tumor. Incidental CT findings: Aortic atherosclerosis. Bilateral kidney cysts. SKELETON: No focal hypermetabolic activity to suggest skeletal metastasis. Incidental CT findings: none IMPRESSION: 1. No findings identified to suggest residual or recurrent lymphoma. Electronically Signed   By: Kerby Moors M.D.   On: 08/13/2020 08:48    03/05/20 of Surgical Pathology ([id number]) SURGICAL PATHOLOGY  CASE: [id number]  PATIENT: Antonio [name]  Surgical Pathology Report      Clinical History: Left testicular mass ([facility])      FINAL MICROSCOPIC DIAGNOSIS:   A. TESTICLE AND CORD, LEFT,  ORCHIECTOMY:  -  Diffuse large B-cell lymphoma  -See comment    COMMENT:   The sections of the testis and spermatic cord nodules show effacement of  the architecture by an atypical lymphoid infiltrate characterized by  predominance of medium and large lymphoid cells with vesicular chromatin  and small nucleoli associated with brisk mitosis. The appearance is  primarily diffuse with lack of atypical follicles. Variable number of  admixed smaller lymphocytes are seen. To further evaluate this process,  flow cytometric analysis was performed ([id number]) and shows a  monoclonal kappa-restricted B-cell population. In addition, a battery  of immunohistochemical stains was performed and shows that the atypical  lymphoid cells are positive for CD20, CD79a, PAX 5, CD10 (weak), BCL-2,  MUM-1 and cytoplasmic kappa. No significant staining is seen with  CD138, cyclin D1, CD30, CD34, TdT, EBV in situ hybridization or  cytoplasmic lambda. Ki67 shows variable expression ranging from 10% to  >50%. There is an admixed T-cell population to a lesser extent as seen  with CD3 and CD5 and there is no apparent co-expression of CD5 in B-cell  areas. The overall features are consistent with involvement by diffuse  large B-cell lymphoma, GCB type. The results were discussed with Dr.  Jeffie Pollock and Irene Limbo on 03/11/2020.    ASSESSMENT & PLAN:   79 y.o. male with  1. Ovoid lesion near the medial aspect of the left knee ? Injection granuloma vs other etiology for lesion. however given previous h/o melanoma and lymphoma will need to be cautious and try to wotrk this up.  04/09/18 MRI of Left Knee revealed An ovoid, lobulated, 60m T2 hyperintense focus within the medial subcutaneous far 1.940mdistal to the joint demonstrates demonstrates nonspecific imaging characteristics.   05/17/18 [place] Soft Tissue Biopsy of the Left Knee did not identify any malignant cells and revealed inflammatory cells.  2. MGUS-  IgM kappa monoclonal paraproteinemia 05/02/18 MMP revealed M Protein at 0.5 with immunofixation revealing IgM with kappa light chains  3/ h/o Melanoma   4. H/o Rt Primary testicular large B cell lymphoma in 2000 treatment with R-CHOP x 6 cycles as part of clinical trial + IT MTX x 4 cycles and RT to left testicle  5. 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 Appears to be a new event since it is occurring 20 yrs after his previous rt testicular lymphoma  PLAN: -Discussed pt labwork today, 08/19/20; Hgb continues to improve, WBC & PLT are nml, blood chemistries are nml, LDH is WNL -Discussed 08/13/2020 PET/CT ([id number] revealed "No findings identified to suggest residual or recurrent lymphoma." -No lab or clinical evidence of Large B-cell Lymphoma recurrence at this time.  -Recommended that the pt continue to eat well, drink at least 48-64 oz of water each day, and walk 20-30 minutes each day.  -Advised pt that the highest risk of recurrence is in the first two years. Will continue to monitor with labwork & clinic visits every 3-4 months.  -Advised pt that we would only repeat scans if new symptoms or lab abnormalities.  -Will discuss removing the Port-a-cath at next visit.  -Will see back in 3 months with labs   FOLLOW UP: RTC with Dr KaIrene Limboith labs in 3 months   The total time spent in the appt was 20 minutes and more than 50% was on counseling and direct patient cares.  All of the patient's questions were answered with apparent satisfaction. The patient knows to call the clinic with any problems,  questions or concerns.    Sullivan Lone MD [place] AAHIVMS [facility] Hematology/Oncology Physician [facility]  (Office):       [phone] (Work cell):  [phone] (Fax):           [phone]  08/19/2020 10:10 AM  I, Yevette Edwards, am acting as a scribe for Dr. Sullivan Lone.   .I have reviewed the above documentation for accuracy  and completeness, and I agree with the above. Brunetta Genera MD

## 2020-09-06 ENCOUNTER — Other Ambulatory Visit: Payer: Self-pay | Admitting: Internal Medicine

## 2020-09-06 DIAGNOSIS — G8911 Acute pain due to trauma: Secondary | ICD-10-CM

## 2020-09-06 DIAGNOSIS — M545 Low back pain, unspecified: Secondary | ICD-10-CM

## 2020-09-07 NOTE — Telephone Encounter (Signed)
rx sent to pharmacy by e-script  

## 2020-09-09 ENCOUNTER — Other Ambulatory Visit: Payer: Self-pay

## 2020-09-09 ENCOUNTER — Encounter: Payer: Self-pay | Admitting: Internal Medicine

## 2020-09-09 ENCOUNTER — Non-Acute Institutional Stay: Payer: PPO | Admitting: Internal Medicine

## 2020-09-09 VITALS — BP 138/89 | HR 67 | Temp 97.5°F | Ht 73.0 in | Wt 193.4 lb

## 2020-09-09 DIAGNOSIS — H9113 Presbycusis, bilateral: Secondary | ICD-10-CM | POA: Diagnosis not present

## 2020-09-09 DIAGNOSIS — R739 Hyperglycemia, unspecified: Secondary | ICD-10-CM | POA: Diagnosis not present

## 2020-09-09 DIAGNOSIS — E7800 Pure hypercholesterolemia, unspecified: Secondary | ICD-10-CM | POA: Diagnosis not present

## 2020-09-09 DIAGNOSIS — I10 Essential (primary) hypertension: Secondary | ICD-10-CM

## 2020-09-09 DIAGNOSIS — Z9079 Acquired absence of other genital organ(s): Secondary | ICD-10-CM | POA: Insufficient documentation

## 2020-09-09 DIAGNOSIS — H353132 Nonexudative age-related macular degeneration, bilateral, intermediate dry stage: Secondary | ICD-10-CM

## 2020-09-09 DIAGNOSIS — E349 Endocrine disorder, unspecified: Secondary | ICD-10-CM | POA: Diagnosis not present

## 2020-09-09 DIAGNOSIS — C8339 Diffuse large B-cell lymphoma, extranodal and solid organ sites: Secondary | ICD-10-CM

## 2020-09-09 MED ORDER — VALACYCLOVIR HCL 1 G PO TABS
ORAL_TABLET | ORAL | 3 refills | Status: DC
Start: 2020-09-09 — End: 2020-11-18

## 2020-09-09 NOTE — Progress Notes (Signed)
Location:  Occupational psychologist of Service:  Clinic (12)  Provider: Tauren Delbuono L. Mariea Clonts, D.O., C.M.D.  Goals of Care:  Advanced Directives 09/09/2020  Does Patient Have a Medical Advance Directive? Yes  Type of Advance Directive Out of facility DNR (pink MOST or yellow form);[place];Living will  Does patient want to make changes to medical advance directive? No - Patient declined  Copy of [place] in Chart? Yes - validated most recent copy scanned in chart (See row information)  Pre-existing out of facility DNR order (yellow form or pink MOST form) Pink MOST form placed in chart (order not valid for inpatient use)     Chief Complaint  Patient presents with  . Medical Management of Chronic Issues    6 month follow up     HPI: Patient is a 80 y.o. male seen today for medical management of chronic diseases.    He had 7.5 hr days of chemo.  He'd bring his computer and phone and work all day.  Got through it in 4 months.  Says his hair is coming back a lot faster than before.  He's gaining some stamina, but it's still affected.  He is getting new hearing aids next week.  His hearing is worse than 5 yrs ago, but graph is Personal assistant.    Is to use three pumps per day of testosterone--two on one shoulder and one on the other.  Container has about 60 shots.  That's 90 pumps a month, but a container gives him 60 so needs a container and a 1/2 per month.  Gets a discount if he can get three at a time.  Needs three containers every 2 months.  Dr. Jeffie Pollock prescribes.    Seeing well.  Usually doesn't even wear his glasses.  Puts on as habit.    He has a tremor of his right hand.  He thinks it is getting worse.  He saw a treatment that's nonsurgical.  He called them--it's an ultrasound.  It's been only the right hand.  No family members have tremors.  He has two younger brothers.    Had a hornet buzzing around the house.  Had just gotten  out of the shower and saw it on the bathroom floor.  Went to squash it with a tissue and it stung him on his finger which hurt like crazy.  This was 5-6 days ago.  He reports that later he noted he had a red spot on his abdomen that was incredibly itchy (it's since improved).   His last trip to the mountains was about 2 wks ago.    Past Medical History:  Diagnosis Date  . Age-related macular degeneration, dry, both eyes   . B-cell lymphoma 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 Carotid stenosis, asymptomatic, left ?  . ED (erectile dysfunction)   . Elevated diaphragm    right  . Erectile dysfunction   . History of basal cell carcinoma (BCC) excision    multiple excision in office  . History of kidney stones   . History of seizure    03-03-2020 per pt x1 while taking interferan for melanoma in 2001 ,  none since, told a side effect of interferan  . History of squamous cell carcinoma excision  multiple skin excision in office  . Hyperlipidemia   . Hypertension    followed by pcp  (03-03-2020 per pt had a stress test approx. 2010, told normal)  . LBBB (left bundle branch block)   . Lymphoma of testis ([place]) 2000   s/p right orchiectomy   . Mass of left testis   . MGUS (monoclonal gammopathy of unknown significance)    oncologist--- dr Irene Limbo  . Personal history of malignant melanoma of skin dermatologist--- dr Wilhemina Bonito   dx 2001 left calf area  s/p WLE ,  completed one year interferan (03-03-2020 pt states did not involve lymph nodes, no recurrence)  . Urgency of urination   . Wears hearing aid in both ears     Past Surgical History:  Procedure Laterality Date  . CATARACT EXTRACTION W/ INTRAOCULAR LENS IMPLANT Bilateral 2018  . IR IMAGING GUIDED PORT INSERTION  03/31/2020  . MELANOMA EXCISION  10/1999  .  ORCHIECTOMY Right 2000  . ORCHIECTOMY Left 03/05/2020   Procedure: ORCHIECTOMY;  Surgeon: [name], MD;  Location: [facility];  Service: Urology;  Laterality: Left;  . TONSILLECTOMY  child  . TOTAL KNEE ARTHROPLASTY Right 06/2009    Allergies  Allergen Reactions  . Latex Itching    Latex tape causes itching and redness.    Outpatient Encounter Medications as of 09/09/2020  Medication Sig  . calcium carbonate (TUMS - DOSED IN MG ELEMENTAL CALCIUM) 500 MG chewable tablet Chew 1 tablet by mouth as needed for indigestion or heartburn.  . Cholecalciferol (VITAMIN D3) 2000 units TABS Take 1 tablet by mouth daily.  . Cyanocobalamin (B-12 PO) Take 1 tablet by mouth daily.  . diphenhydramine-acetaminophen (TYLENOL PM) 25-500 MG TABS tablet Take 1 tablet by mouth at bedtime as needed.  Marland Kitchen Ketotifen Fumarate (REFRESH EYE ITCH RELIEF OP) Place 1 drop into both eyes daily as needed (itchy eyes).   Marland Kitchen losartan (COZAAR) 100 MG tablet Take 1 tablet (100 mg total) by mouth daily.  . meloxicam (MOBIC) 7.5 MG tablet TAKE 1 TABLET (7.5 MG TOTAL) BY MOUTH 2 (TWO) TIMES DAILY AS NEEDED FOR PAIN.  . Multiple Vitamins-Minerals (PRESERVISION AREDS 2 PO) Take 1 capsule by mouth 2 (two) times daily.   . simvastatin (ZOCOR) 40 MG tablet TAKE 1 TABLET BY MOUTH EVERY DAY  . Testosterone 20.25 MG/ACT (1.62%) GEL SMARTSIG:40.5 Milligram(s) Topical Every Morning  . valACYclovir (VALTREX) 1000 MG tablet Take one tablet by mouth twice daily for 3 days as needed for flares.  . [DISCONTINUED] valACYclovir (VALTREX) 1000 MG tablet Take one tablet by mouth twice daily for 3 days as needed for flares.   No facility-administered encounter medications on file as of 09/09/2020.    Review of Systems:  Review of Systems  Constitutional: Positive for malaise/fatigue. Negative for chills and fever.  HENT: Positive for hearing loss. Negative for congestion and sore throat.   Eyes: Negative for blurred vision.        Macular--followed by Dr. Coralyn Pear  Respiratory: Negative for cough and shortness of breath.   Cardiovascular: Negative for chest pain, palpitations and leg swelling.  Gastrointestinal: Negative for abdominal pain and constipation.  Genitourinary: Negative for dysuria.  Musculoskeletal: Negative for falls and joint pain.  Skin: Positive for itching and rash.  Neurological: Negative for dizziness and loss of consciousness.  Psychiatric/Behavioral: Negative for depression and memory loss. The patient is not nervous/anxious and does not have insomnia.     Health Maintenance  Topic Date Due  .  TETANUS/TDAP  03/04/2024  . INFLUENZA VACCINE  Completed  . COVID-19 Vaccine  Completed  . PNA vac Low Risk Adult  Completed    Physical Exam: Vitals:   09/09/20 1126  BP: 138/89  Pulse: 67  Temp: (!) 97.5 F (36.4 C)  TempSrc: Temporal  SpO2: 97%  Weight: 193 lb 6.4 oz (87.7 kg)  Height: 6\' 1"  (1.854 m)   Body mass index is 25.52 kg/m. Physical Exam Vitals reviewed.  Constitutional:      General: He is not in acute distress.    Appearance: Normal appearance. He is not ill-appearing or toxic-appearing.  HENT:     Head: Normocephalic and atraumatic.     Right Ear: Tympanic membrane, ear canal and external ear normal.     Left Ear: Tympanic membrane, ear canal and external ear normal.     Ears:     Comments: Mild cerumen in canals, but nonobstructive, hearing aids Cardiovascular:     Rate and Rhythm: Normal rate and regular rhythm.     Pulses: Normal pulses.     Heart sounds: Normal heart sounds.  Pulmonary:     Effort: Pulmonary effort is normal.     Breath sounds: Normal breath sounds.  Abdominal:     General: Bowel sounds are normal.     Palpations: Abdomen is soft.  Musculoskeletal:        General: Normal range of motion.     Cervical back: Neck supple.     Right lower leg: No edema.     Left lower leg: No edema.  Lymphadenopathy:     Cervical: No cervical adenopathy.   Skin:    Capillary Refill: Capillary refill takes less than 2 seconds.  Neurological:     General: No focal deficit present.     Mental Status: He is alert and oriented to person, place, and time.     Motor: No weakness.     Gait: Gait normal.  Psychiatric:        Mood and Affect: Mood normal.        Behavior: Behavior normal.        Thought Content: Thought content normal.        Judgment: Judgment normal.     Labs reviewed: Basic Metabolic Panel: Recent Labs    04/01/20 1111 04/01/20 1111 04/13/20 0920 05/06/20 0832 06/16/20 0901 07/08/20 1015 08/19/20 0854  NA 139   < > 144   < > 139 142 144  K 4.4   < > 4.4   < > 4.2 4.3 4.1  CL 103   < > 109   < > 108 109 108  CO2 29   < > 25   < > 26 29 28   GLUCOSE 81   < > 111*   < > 138* 104* 129*  BUN 18   < > 18   < > 17 18 19   CREATININE 0.76   < > 0.81   < > 0.83 0.75 0.86  CALCIUM 8.8*   < > 9.0   < > 9.6 8.8* 9.1  MG 2.1  --  1.8  --   --   --   --    < > = values in this interval not displayed.   Liver Function Tests: Recent Labs    06/16/20 0901 07/08/20 1015 08/19/20 0854  AST 17 19 20   ALT 22 27 27   ALKPHOS 105 86 68  BILITOT 0.4 0.6 0.7  PROT 6.4* 6.6 6.7  ALBUMIN 3.8 3.9 4.1   No results for input(s): LIPASE, AMYLASE in the last 8760 hours. No results for input(s): AMMONIA in the last 8760 hours. CBC: Recent Labs    06/16/20 0901 07/08/20 1015 08/19/20 0854  WBC 11.9* 17.1* 5.8  NEUTROABS 10.4* 16.0* 4.1  HGB 11.9* 12.5* 13.4  HCT 37.2* 38.6* 42.6  MCV 103.6* 104.9* 104.7*  PLT 200 264 206   Lipid Panel: Recent Labs    02/27/20 0000  CHOL 132  HDL 65  LDLCALC 56  TRIG 56   Lab Results  Component Value Date   HGBA1C 5.7 02/27/2020    Procedures since last visit: NM PET Image Restag (PS) Skull Base To Thigh  Result Date: 08/13/2020 CLINICAL DATA:  Subsequent treatment strategy for lymphoma. EXAM: NUCLEAR MEDICINE PET SKULL BASE TO THIGH TECHNIQUE: 9.6 mCi F-18 FDG was injected  intravenously. Full-ring PET imaging was performed from the skull base to thigh after the radiotracer. CT data was obtained and used for attenuation correction and anatomic localization. Fasting blood glucose: 97 mg/dl COMPARISON:  03/02/2020 FINDINGS: Mediastinal blood pool activity: SUV max 2.46 Liver activity: SUV max 3.44 NECK: No hypermetabolic lymph nodes in the neck. Incidental CT findings: none CHEST: No enlarged axillary, supraclavicular, mediastinal, or hilar lymph nodes. Incidental CT findings: Aortic atherosclerosis. Coronary artery atherosclerotic calcifications. Asymmetric elevation of right hemidiaphragm with subsegmental areas of atelectasis/scarring in the right base. ABDOMEN/PELVIS: No abnormal FDG uptake within the liver, pancreas, adrenal glands, or spleen. No FDG avid abdominopelvic lymph nodes. Status post left orchectomy. No signs of recurrent or residual local tumor. Incidental CT findings: Aortic atherosclerosis. Bilateral kidney cysts. SKELETON: No focal hypermetabolic activity to suggest skeletal metastasis. Incidental CT findings: none IMPRESSION: 1. No findings identified to suggest residual or recurrent lymphoma. Electronically Signed   By: Kerby Moors M.D.   On: 08/13/2020 08:48    Assessment/Plan 1. 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 S/P orchiectomy -recovered well -now on testosterone repletion per urology  3. Testosterone deficiency -cont repletion per urology  4. Pure hypercholesterolemia -cont zocor, f/u flp with labs at hematology in feb  5. Benign essential HTN -bp at goal with current regimen, cont same and monitor  6. Intermediate stage nonexudative age-related macular degeneration of both eyes -doing ok, cont regular f/u with Dr. Coralyn Pear  7. Presbycusis of both ears -progressed, is for  new hearing aids  8. Hyperglycemia -f/u hba1c with labs 2/3 with hematology  Labs/tests ordered: Flp, hba1c 2/3 with other labs at hematology--move up time so fasting  Next appt:  6 mos.    Shahara Hartsfield L. Iliana Hutt, D.O. [facility] [address], [place] [address] Cell Phone (Mon-Fri 8am-5pm):  [phone] On Call:  [phone] & follow prompts after 5pm & weekends Office Phone:  [phone] Office Fax:  [phone]

## 2020-09-17 ENCOUNTER — Telehealth: Payer: Self-pay | Admitting: *Deleted

## 2020-09-17 DIAGNOSIS — G250 Essential tremor: Secondary | ICD-10-CM

## 2020-09-17 MED ORDER — PROPRANOLOL HCL 10 MG PO TABS: ORAL_TABLET | ORAL | 1 refills | Status: DC

## 2020-09-17 NOTE — Telephone Encounter (Signed)
Patient called and stated that he seen you on 09/09/2020 and talked about medication for tremor in hand.  Stated that he has to try at least 2 medications before he can get an Ultrasound done.  Stated that you were going to send him in something to his pharmacy but they never received it.  Please Advise.

## 2020-09-17 NOTE — Telephone Encounter (Signed)
LMOM to return call.

## 2020-09-17 NOTE — Telephone Encounter (Signed)
We never did conclude what he would take.  I will send in propranolol for him first to try for the tremor.

## 2020-09-18 NOTE — Telephone Encounter (Signed)
LMOM to return call.

## 2020-09-18 NOTE — Telephone Encounter (Signed)
Patient notified and agreed.  

## 2020-10-13 DIAGNOSIS — Z9189 Other specified personal risk factors, not elsewhere classified: Secondary | ICD-10-CM | POA: Diagnosis not present

## 2020-10-13 DIAGNOSIS — Z20828 Contact with and (suspected) exposure to other viral communicable diseases: Secondary | ICD-10-CM | POA: Diagnosis not present

## 2020-10-15 ENCOUNTER — Telehealth: Payer: Self-pay | Admitting: *Deleted

## 2020-10-15 ENCOUNTER — Other Ambulatory Visit: Payer: Self-pay | Admitting: Internal Medicine

## 2020-10-15 DIAGNOSIS — D849 Immunodeficiency, unspecified: Secondary | ICD-10-CM

## 2020-10-15 DIAGNOSIS — U071 Immunodeficiency, unspecified: Secondary | ICD-10-CM

## 2020-10-15 MED ORDER — BENZONATATE 100 MG PO CAPS: 100.0000 mg | ORAL_CAPSULE | Freq: Three times a day (TID) | ORAL | 0 refills | Status: DC | PRN

## 2020-10-15 MED ORDER — SOTROVIMAB 500 MG/8ML IV SOLN: 500.0000 mg | Freq: Once | INTRAVENOUS | 0 refills | Status: AC

## 2020-10-15 NOTE — Telephone Encounter (Signed)
I had just heard from [place] that Antonio [name] had checked him and he was doing well and did not need to infusion and that [facility] also did not supply that particular one.

## 2020-10-15 NOTE — Telephone Encounter (Signed)
Spoke with patient who was currently being evaluated by EMS staff at the time of call. Patient verbalized understanding of Dr.Reed's reply.

## 2020-10-15 NOTE — Telephone Encounter (Signed)
Patient called and stated that he was diagnosed  With Covid Yesterday at [facility].  Stated that Autumn came to his room. Patient stated that he has a terrible cough and wants something called in for this. Stated that he needs something to relax his muscles.  He stated that he thought someone was coming to his room to check him yesterday and no one came.  Stated that Autumn is not in the office today to speak with and wants to know what he needs to be doing.  Please Advise.

## 2020-10-15 NOTE — Telephone Encounter (Signed)
Incoming call received from pharmacist at [facility], RX for Sotrovimab was sent to them and they do not supply that medication. Provider will need to send to another pharmacy chain   I called patient and his wife stated he was asleep and she would like rx sent to [facility]. Mrs.Parrilla asked me how will she get rx, will [facility] deliver or will she have to find a way to get it. I informed Mrs.Hamrick that I will ask Dr.Reed as I am unfamiliar with [facility] and their process.     RX pending and please advise on question about pharmacy

## 2020-10-19 ENCOUNTER — Encounter: Payer: Self-pay | Admitting: Internal Medicine

## 2020-10-19 ENCOUNTER — Telehealth: Payer: Self-pay | Admitting: *Deleted

## 2020-10-19 ENCOUNTER — Other Ambulatory Visit: Payer: Self-pay

## 2020-10-19 ENCOUNTER — Telehealth (INDEPENDENT_AMBULATORY_CARE_PROVIDER_SITE_OTHER): Payer: PPO | Admitting: Internal Medicine

## 2020-10-19 DIAGNOSIS — D849 Immunodeficiency, unspecified: Secondary | ICD-10-CM

## 2020-10-19 DIAGNOSIS — U071 COVID-19: Secondary | ICD-10-CM

## 2020-10-19 MED ORDER — ALBUTEROL SULFATE HFA 108 (90 BASE) MCG/ACT IN AERS: 2.0000 | INHALATION_SPRAY | Freq: Four times a day (QID) | RESPIRATORY_TRACT | 2 refills | Status: DC | PRN

## 2020-10-19 MED ORDER — ZINC GLUCONATE 50 MG PO TABS: 100.0000 mg | ORAL_TABLET | Freq: Every day | ORAL | 0 refills | Status: DC

## 2020-10-19 MED ORDER — HYDROCOD POLST-CPM POLST ER 10-8 MG/5ML PO SUER: 5.0000 mL | Freq: Every evening | ORAL | 0 refills | Status: DC | PRN

## 2020-10-19 MED ORDER — CHOLECALCIFEROL 100 MCG (4000 UT) PO CAPS
1.0000 | ORAL_CAPSULE | Freq: Every day | ORAL | 0 refills | Status: DC
Start: 2020-10-19 — End: 2021-06-22

## 2020-10-19 MED ORDER — ASCORBIC ACID 500 MG PO TABS: 500.0000 mg | ORAL_TABLET | Freq: Every day | ORAL | 0 refills | Status: DC

## 2020-10-19 NOTE — Telephone Encounter (Signed)
Patient's wife/daughter called. Patient tested positive for Covid last week after week of symptoms. They said he was not getting better and asked if Dr. Candise Che could order something. Advised them that as oncologist, Dr. Candise Che did not provide treatment for active Covid.That treatment would be through PCP or hospital.   Inquired  if monoclonal antibody ordered by PCP/Dr. Renato Gails on 10/15/20 had been given yet. Daughter said they tried to fill [facility] and were told it was not available. Informed them that this treatment was an IV infusion and would be given at infusion center, not picked up from [facility] for home administration. Advised them to call Dr. Ernest Mallick office immediately to request assistance scheduling this treatment. Daughter verbalized understanding and said she would call Dr. Renato Gails this morning. Dr. Candise Che informed of this conversation with patient's wife/daughter.

## 2020-10-19 NOTE — Progress Notes (Signed)
This service is provided via telemedicine  No vital signs collected/recorded due to the encounter was a telemedicine visit.   Location of patient (ex: home, work[place]  [place]   Patient consents to a telephone visit:  yes  Location of the provider (ex: office, home): [place]  Name of any referring provider:  Dr. Hollace Kinnier DO   Names of all persons participating in the telemedicine service and their role in the encounter:  Antonio [name] (daughter) Patient Antonio [name], His wife Dr. Mariea Clonts DO and Randell Patient CMA   Time spent on call: 10 min by CMA  Virtual Visit via Video Note  I connected with Antonio [name] on 10/19/20 at 11:00 AM EST by a video enabled telemedicine application and verified that I am speaking with the correct person using two identifiers.  Location: Patient: [facility] [place] Provider: [facility] clinic   I discussed the limitations of evaluation and management by telemedicine and the availability of in person appointments. The patient expressed understanding and agreed to proceed.  History of Present Illness: 80 yo male with h/o B cell lymphoma who recently completed chemo and MGUS.  He developed symptoms 12 days ago and was tested 6 days ago and confirmed COVID positive at [place] by [facility] clinic nurse who visited his home.  He has had cough mostly at night, but sometimes otherwise in fits--ranges from dry to wet and congested sounding.  He's weak and fatigued and intake is poor.  His daughter, Antonio [name], and wife, Antonio [name] are with him and share that he is not drinking enough fluids and eating very little--a bowl of grapes has been in front of him since yesterday, for examples.  They've offered gatorade and he's trying to drink it.   As far as dyspnea, he was struggling more on Thursday and ems had been called.  They evaluated him b/c his sats had dropped into the 80s but it did not last for 10 mins and improved.  He did not have labored breathing and is really only sob now if bending over.  10am  today, POX was 88%.  His color is good. He's been irritable and weak and his daughter is helping him up and down.  Attempt was made last week to get him monoclonal antibody [facility] and from long-term care pharmacy, [facility], but we were told they were no longer being distributed (even new one) b/c of unresponsiveness of omicron variant so he did not get treatment and last I knew was when the EMS team was there, but never got a report of what happened afterward.  He is now outside of the window for monoclonal ab treatment.   Observations/Objective: POX 88% RA T977 this am (had been 104.4 yesterday) BP and HR ok with clinic nurses when [place] notes reviewed Appears weak and fatigued Skin tone pink Respirations normal Did not cough during video visit at all  Assessment and Plan: 1. COVID-19 in immunocompromised patient ([place]) - sent Rxs for codeine cough syrup, vitamins to pharmacy, encouraged prone/side sleeping, rest, hydration -recommended NS 1L via IV but nursing did not feel comfortable with this in his home and apparently ED was offered vs staying there and continuing attempts at PO fluids -no isolation unit present at this time a residents are back to getting rehab as usual - MyChart COVID-19 home monitoring program; Future - Temperature monitoring; Future - chlorpheniramine-HYDROcodone (TUSSIONEX PENNKINETIC ER) 10-8 MG/5ML SUER; Take 5 mLs by mouth at bedtime as needed for cough.  Dispense: 115 mL; Refill: 0 -  Cholecalciferol 100 MCG (4000 UT) CAPS; Take 1 capsule (4,000 Units total) by mouth daily.  Dispense: 14 capsule; Refill: 0 - zinc gluconate 50 MG tablet; Take 2 tablets (100 mg total) by mouth daily.  Dispense: 28 tablet; Refill: 0 - ascorbic acid (VITAMIN C) 500 MG tablet; Take 1 tablet (500 mg total) by mouth daily.  Dispense: 14 tablet; Refill: 0 - albuterol (VENTOLIN HFA) 108 (90 Base) MCG/ACT inhaler; Inhale 2 puffs into the lungs every 6 (six) hours as needed for shortness of  breath.  Dispense: 8 g; Refill: 2  Follow Up Instructions: Also signed him up for home monitoring in mychart and take meds as above.  Clinic nurse was going to check on his lung sounds.     I discussed the assessment and treatment plan with the patient. The patient was provided an opportunity to ask questions and all were answered. The patient agreed with the plan and demonstrated an understanding of the instructions.   The patient was advised to call back or seek an in-person evaluation if the symptoms worsen or if the condition fails to improve as anticipated.  I provided 27 minutes of non-face-to-face time during this encounter.  [name], D.O. Geriatrics [facility] [address], [place] [address] Cell Phone (Mon-Fri 8am-5pm):  [phone] On Call:  [phone] & follow prompts after 5pm & weekends Office Phone:  [phone] Office Fax:  [phone]

## 2020-10-19 NOTE — Patient Instructions (Signed)
When to Seek Emergency Medical Attention Look for emergency warning signs* for COVID-19. If someone is showing any of these signs, seek emergency medical care immediately:  Trouble breathing Persistent pain or pressure in the chest New confusion Inability to wake or stay awake Pale, gray, or blue-colored skin, lips, or nail beds, depending on skin tone *This list is not all possible symptoms. Please call your medical provider for any other symptoms that are severe or concerning to you.  Call [facility] security who will contact 911 or call ahead to your local emergency facility: Notify the operator that you are seeking care for someone who has or may have COVID-19.

## 2020-10-20 ENCOUNTER — Other Ambulatory Visit: Payer: Self-pay | Admitting: Hematology

## 2020-10-20 ENCOUNTER — Emergency Department (HOSPITAL_COMMUNITY): Payer: PPO

## 2020-10-20 ENCOUNTER — Inpatient Hospital Stay (HOSPITAL_COMMUNITY): Payer: PPO

## 2020-10-20 ENCOUNTER — Other Ambulatory Visit: Payer: Self-pay

## 2020-10-20 ENCOUNTER — Inpatient Hospital Stay (HOSPITAL_COMMUNITY)
Admission: EM | Admit: 2020-10-20 | Discharge: 2020-10-23 | DRG: 177 | Disposition: A | Discharge status: Home Health | Payer: PPO | Attending: Internal Medicine | Admitting: Internal Medicine

## 2020-10-20 ENCOUNTER — Encounter (HOSPITAL_COMMUNITY): Payer: Self-pay | Admitting: Emergency Medicine

## 2020-10-20 DIAGNOSIS — J90 Pleural effusion, not elsewhere classified: Secondary | ICD-10-CM | POA: Diagnosis not present

## 2020-10-20 DIAGNOSIS — H353130 Nonexudative age-related macular degeneration, bilateral, stage unspecified: Secondary | ICD-10-CM | POA: Diagnosis present

## 2020-10-20 DIAGNOSIS — D539 Nutritional anemia, unspecified: Secondary | ICD-10-CM | POA: Diagnosis present

## 2020-10-20 DIAGNOSIS — J9811 Atelectasis: Secondary | ICD-10-CM | POA: Diagnosis not present

## 2020-10-20 DIAGNOSIS — Z85828 Personal history of other malignant neoplasm of skin: Secondary | ICD-10-CM | POA: Diagnosis not present

## 2020-10-20 DIAGNOSIS — Z85820 Personal history of malignant melanoma of skin: Secondary | ICD-10-CM

## 2020-10-20 DIAGNOSIS — D472 Monoclonal gammopathy: Secondary | ICD-10-CM | POA: Diagnosis not present

## 2020-10-20 DIAGNOSIS — J1282 Pneumonia due to coronavirus disease 2019: Secondary | ICD-10-CM | POA: Diagnosis not present

## 2020-10-20 DIAGNOSIS — Z9079 Acquired absence of other genital organ(s): Secondary | ICD-10-CM | POA: Diagnosis not present

## 2020-10-20 DIAGNOSIS — I447 Left bundle-branch block, unspecified: Secondary | ICD-10-CM | POA: Diagnosis present

## 2020-10-20 DIAGNOSIS — E785 Hyperlipidemia, unspecified: Secondary | ICD-10-CM | POA: Diagnosis not present

## 2020-10-20 DIAGNOSIS — Z91040 Latex allergy status: Secondary | ICD-10-CM | POA: Diagnosis not present

## 2020-10-20 DIAGNOSIS — R0902 Hypoxemia: Secondary | ICD-10-CM

## 2020-10-20 DIAGNOSIS — R0602 Shortness of breath: Secondary | ICD-10-CM | POA: Diagnosis not present

## 2020-10-20 DIAGNOSIS — Z974 Presence of external hearing-aid: Secondary | ICD-10-CM

## 2020-10-20 DIAGNOSIS — Z66 Do not resuscitate: Secondary | ICD-10-CM | POA: Diagnosis not present

## 2020-10-20 DIAGNOSIS — U071 COVID-19: Principal | ICD-10-CM

## 2020-10-20 DIAGNOSIS — Z87442 Personal history of urinary calculi: Secondary | ICD-10-CM | POA: Diagnosis not present

## 2020-10-20 DIAGNOSIS — G250 Essential tremor: Secondary | ICD-10-CM | POA: Diagnosis present

## 2020-10-20 DIAGNOSIS — Z8249 Family history of ischemic heart disease and other diseases of the circulatory system: Secondary | ICD-10-CM | POA: Diagnosis not present

## 2020-10-20 DIAGNOSIS — C8339 Diffuse large B-cell lymphoma, extranodal and solid organ sites: Secondary | ICD-10-CM | POA: Diagnosis not present

## 2020-10-20 DIAGNOSIS — I10 Essential (primary) hypertension: Secondary | ICD-10-CM | POA: Diagnosis present

## 2020-10-20 DIAGNOSIS — J189 Pneumonia, unspecified organism: Secondary | ICD-10-CM | POA: Diagnosis not present

## 2020-10-20 DIAGNOSIS — D649 Anemia, unspecified: Secondary | ICD-10-CM

## 2020-10-20 DIAGNOSIS — Z83438 Family history of other disorder of lipoprotein metabolism and other lipidemia: Secondary | ICD-10-CM

## 2020-10-20 DIAGNOSIS — J9601 Acute respiratory failure with hypoxia: Secondary | ICD-10-CM

## 2020-10-20 DIAGNOSIS — Z8572 Personal history of non-Hodgkin lymphomas: Secondary | ICD-10-CM

## 2020-10-20 LAB — COMPREHENSIVE METABOLIC PANEL
ALT: 67 U/L — ABNORMAL HIGH (ref 0–44)
AST: 75 U/L — ABNORMAL HIGH (ref 15–41)
Albumin: 3.3 g/dL — ABNORMAL LOW (ref 3.5–5.0)
Alkaline Phosphatase: 45 U/L (ref 38–126)
Anion gap: 11 (ref 5–15)
BUN: 9 mg/dL (ref 8–23)
CO2: 26 mmol/L (ref 22–32)
Calcium: 8.6 mg/dL — ABNORMAL LOW (ref 8.9–10.3)
Chloride: 98 mmol/L (ref 98–111)
Creatinine, Ser: 0.57 mg/dL — ABNORMAL LOW (ref 0.61–1.24)
GFR, Estimated: >60 mL/min (ref >60)
Glucose, Bld: 100 mg/dL — ABNORMAL HIGH (ref 70–99)
Potassium: 4.1 mmol/L (ref 3.5–5.1)
Sodium: 135 mmol/L (ref 135–145)
Total Bilirubin: 0.4 mg/dL (ref 0.3–1.2)
Total Protein: 6.2 g/dL — ABNORMAL LOW (ref 6.5–8.1)

## 2020-10-20 LAB — CBC WITH DIFFERENTIAL/PLATELET
Abs Immature Granulocytes: 0.05 10*3/uL (ref 0.00–0.07)
Basophils Absolute: 0 10*3/uL (ref 0.0–0.1)
Basophils Relative: 0 %
Eosinophils Absolute: 0 10*3/uL (ref 0.0–0.5)
Eosinophils Relative: 0 %
HCT: 19.5 % — ABNORMAL LOW (ref 39.0–52.0)
Hemoglobin: 6.3 g/dL — CL (ref 13.0–17.0)
Immature Granulocytes: 1 %
Lymphocytes Relative: 11 %
Lymphs Abs: 0.8 10*3/uL (ref 0.7–4.0)
MCH: 32.6 pg (ref 26.0–34.0)
MCHC: 32.3 g/dL (ref 30.0–36.0)
MCV: 101 fL — ABNORMAL HIGH (ref 80.0–100.0)
Monocytes Absolute: 0.8 10*3/uL (ref 0.1–1.0)
Monocytes Relative: 12 %
Neutro Abs: 5 10*3/uL (ref 1.7–7.7)
Neutrophils Relative %: 76 %
Platelets: 291 10*3/uL (ref 150–400)
RBC: 1.93 MIL/uL — ABNORMAL LOW (ref 4.22–5.81)
RDW: 13.3 % (ref 11.5–15.5)
WBC: 6.6 10*3/uL (ref 4.0–10.5)
nRBC: 0 % (ref 0.0–0.2)

## 2020-10-20 LAB — ABO/RH: ABO/RH(D): A POS

## 2020-10-20 LAB — PROCALCITONIN: Procalcitonin: <0.1 ng/mL

## 2020-10-20 LAB — TROPONIN I (HIGH SENSITIVITY)
Troponin I (High Sensitivity): 9 ng/L (ref <18)
Troponin I (High Sensitivity): 10 ng/L (ref <18)

## 2020-10-20 LAB — D-DIMER, QUANTITATIVE: D-Dimer, Quant: 0.47 ug/mL-FEU (ref 0.00–0.50)

## 2020-10-20 LAB — LACTATE DEHYDROGENASE: LDH: 237 U/L — ABNORMAL HIGH (ref 98–192)

## 2020-10-20 LAB — PREPARE RBC (CROSSMATCH)

## 2020-10-20 LAB — C-REACTIVE PROTEIN: CRP: 7.9 mg/dL — ABNORMAL HIGH (ref <1.0)

## 2020-10-20 LAB — LACTIC ACID, PLASMA
Lactic Acid, Venous: 0.8 mmol/L (ref 0.5–1.9)
Lactic Acid, Venous: 2 mmol/L (ref 0.5–1.9)

## 2020-10-20 LAB — POC OCCULT BLOOD, ED: Fecal Occult Bld: NEGATIVE

## 2020-10-20 LAB — TRIGLYCERIDES: Triglycerides: 108 mg/dL (ref <150)

## 2020-10-20 LAB — POC SARS CORONAVIRUS 2 AG -  ED: SARS Coronavirus 2 Ag: POSITIVE — AB

## 2020-10-20 LAB — FIBRINOGEN: Fibrinogen: 655 mg/dL — ABNORMAL HIGH (ref 210–475)

## 2020-10-20 LAB — FERRITIN: Ferritin: 721 ng/mL — ABNORMAL HIGH (ref 24–336)

## 2020-10-20 MED ORDER — PROPRANOLOL HCL 10 MG PO TABS
10.0000 mg | ORAL_TABLET | Freq: Three times a day (TID) | ORAL | Status: DC | PRN
  Filled 2020-10-20: qty 1

## 2020-10-20 MED ORDER — SODIUM CHLORIDE 0.9 % IV SOLN
100.0000 mg | Freq: Every day | INTRAVENOUS | Status: DC
  Administered 2020-10-21 – 2020-10-23 (×3): 100 mg via INTRAVENOUS
  Filled 2020-10-20 (×3): qty 20

## 2020-10-20 MED ORDER — ACETAMINOPHEN 325 MG PO TABS
650.0000 mg | ORAL_TABLET | Freq: Four times a day (QID) | ORAL | Status: DC | PRN
  Administered 2020-10-21 – 2020-10-23 (×3): 650 mg via ORAL
  Filled 2020-10-20 (×3): qty 2

## 2020-10-20 MED ORDER — DOXYCYCLINE HYCLATE 100 MG PO TABS
100.0000 mg | ORAL_TABLET | Freq: Two times a day (BID) | ORAL | Status: DC
  Administered 2020-10-20 – 2020-10-21 (×2): 100 mg via ORAL
  Filled 2020-10-20 (×2): qty 1

## 2020-10-20 MED ORDER — IPRATROPIUM-ALBUTEROL 20-100 MCG/ACT IN AERS
1.0000 | INHALATION_SPRAY | Freq: Four times a day (QID) | RESPIRATORY_TRACT | Status: DC | PRN
  Filled 2020-10-20: qty 4

## 2020-10-20 MED ORDER — SODIUM CHLORIDE 0.9 % IV SOLN
2.0000 g | Freq: Once | INTRAVENOUS | Status: AC
  Administered 2020-10-20: 2 g via INTRAVENOUS
  Filled 2020-10-20 (×2): qty 2

## 2020-10-20 MED ORDER — SODIUM CHLORIDE 0.9% FLUSH
10.0000 mL | Freq: Two times a day (BID) | INTRAVENOUS | Status: DC
  Administered 2020-10-21 – 2020-10-23 (×5): 10 mL

## 2020-10-20 MED ORDER — LOSARTAN POTASSIUM 50 MG PO TABS
100.0000 mg | ORAL_TABLET | Freq: Every day | ORAL | Status: DC
  Administered 2020-10-20 – 2020-10-23 (×4): 100 mg via ORAL
  Filled 2020-10-20: qty 4
  Filled 2020-10-20 (×4): qty 2

## 2020-10-20 MED ORDER — DEXAMETHASONE 6 MG PO TABS
6.0000 mg | ORAL_TABLET | ORAL | Status: DC
Start: 2020-10-20 — End: 2020-10-21
  Administered 2020-10-20: 6 mg via ORAL
  Filled 2020-10-20 (×2): qty 1

## 2020-10-20 MED ORDER — SODIUM CHLORIDE 0.9 % IV SOLN
2.0000 g | Freq: Three times a day (TID) | INTRAVENOUS | Status: DC
  Administered 2020-10-21: 2 g via INTRAVENOUS
  Filled 2020-10-20: qty 2

## 2020-10-20 MED ORDER — LACTATED RINGERS IV SOLN: INTRAVENOUS | Status: AC

## 2020-10-20 MED ORDER — SODIUM CHLORIDE 0.9% FLUSH: 10.0000 mL | INTRAVENOUS | Status: DC | PRN

## 2020-10-20 MED ORDER — SODIUM CHLORIDE 0.9 % IV SOLN: 10.0000 mL/h | Freq: Once | INTRAVENOUS | Status: DC

## 2020-10-20 MED ORDER — SIMVASTATIN 20 MG PO TABS
40.0000 mg | ORAL_TABLET | Freq: Every evening | ORAL | Status: DC
  Administered 2020-10-20 – 2020-10-22 (×3): 40 mg via ORAL
  Filled 2020-10-20: qty 2
  Filled 2020-10-20: qty 4
  Filled 2020-10-20 (×4): qty 2

## 2020-10-20 MED ORDER — CHLORHEXIDINE GLUCONATE CLOTH 2 % EX PADS
6.0000 | MEDICATED_PAD | Freq: Every day | CUTANEOUS | Status: DC
  Administered 2020-10-21 – 2020-10-22 (×2): 6 via TOPICAL

## 2020-10-20 MED ORDER — SODIUM CHLORIDE 0.9 % IV SOLN
200.0000 mg | Freq: Once | INTRAVENOUS | Status: AC
  Administered 2020-10-20: 200 mg via INTRAVENOUS
  Filled 2020-10-20: qty 200

## 2020-10-20 MED ORDER — SODIUM CHLORIDE 0.9% FLUSH
3.0000 mL | Freq: Two times a day (BID) | INTRAVENOUS | Status: DC
  Administered 2020-10-20: 3 mL via INTRAVENOUS

## 2020-10-20 MED ORDER — SODIUM CHLORIDE 0.9 % IV SOLN: INTRAVENOUS | Status: DC

## 2020-10-20 MED ORDER — GUAIFENESIN-DM 100-10 MG/5ML PO SYRP
10.0000 mL | ORAL_SOLUTION | ORAL | Status: DC | PRN
  Administered 2020-10-21 – 2020-10-22 (×3): 10 mL via ORAL
  Filled 2020-10-20 (×4): qty 10

## 2020-10-20 NOTE — Plan of Care (Signed)
  Problem: Education: Goal: Knowledge of General Education information will improve Description: Including pain rating scale, medication(s)/side effects and non-pharmacologic comfort measures Outcome: Progressing   Problem: Health Behavior/Discharge Planning: Goal: Ability to manage health-related needs will improve Outcome: Progressing   Problem: Clinical Measurements: Goal: Diagnostic test results will improve Outcome: Progressing Goal: Respiratory complications will improve Outcome: Progressing   Problem: Education: Goal: Knowledge of risk factors and measures for prevention of condition will improve Outcome: Progressing   Problem: Respiratory: Goal: Will maintain a patent airway Outcome: Progressing   Problem: Activity: Goal: Risk for activity intolerance will decrease Outcome: Not Progressing

## 2020-10-20 NOTE — ED Triage Notes (Signed)
Patient is Covid positive and from home. He has had symptoms for 13 days and was test 8 days ago. For the last 2 days he has become more short of breath. With exertion, his sats dropped to 86%. Before attempting to walk with EMS, his O2 sat was 92 on 2L nasal canula. With walking his sats dropped and EMS had to put him on 6L to bring his sats to 99%. He also complains of a cough. The patient is 4 weeks post chemo and is a DNR.   EMS vitals: 118/80 BP 92 HR 99% SPO2 on 6L nasal canula 98.5 Temp

## 2020-10-20 NOTE — Progress Notes (Signed)
Pharmacy Antibiotic Note  Antonio [name] is a 80 y.o. male admitted on 10/20/2020 with pneumonia.  Pharmacy has been consulted for Cefepime dosing.  Afebrile, WBC WNL, PCT <0.1, Scr<0.8 (NCrCl>60)  Plan: Cefepime 2gm IV q8h Monitor renal function and cx data  F/U actual weight once able to obtain     Temp (24hrs), Avg:99 F (37.2 C), Min:99 F (37.2 C), Max:99 F (37.2 C)  Recent Labs  Lab 10/20/20 1400 10/20/20 1630  WBC 6.6  --   CREATININE 0.57*  --   LATICACIDVEN 0.8 2.0*    CrCl cannot be calculated (Unknown ideal weight.).    Allergies  Allergen Reactions  . Latex Itching    Latex tape causes itching and redness.    Antimicrobials this admission: 1/4 Cefepime >>  1/4 Doxycycline >>  1/4 Remdesivir >>  Dose adjustments this admission:  Microbiology results: 1/4 BCx:   Thank you for allowing pharmacy to be a part of this patient's care.  Junita Push PharmD, BCPS 10/20/2020 7:37 PM

## 2020-10-20 NOTE — ED Notes (Signed)
[facility] at Bedside, report given to [facility] (paramedic).

## 2020-10-20 NOTE — Progress Notes (Signed)
   10/20/20 2203  Assess: MEWS Score  Temp 98.2 F (36.8 C)  BP (!) 170/99  Pulse Rate (!) 111  ECG Heart Rate (!) 111  Resp (!) 27  Level of Consciousness Alert  SpO2 98 %  O2 Device Nasal Cannula  O2 Flow Rate (L/min) 6 L/min  Assess: MEWS Score  MEWS Temp 0  MEWS Systolic 0  MEWS Pulse 2  MEWS RR 2  MEWS LOC 0  MEWS Score 4  MEWS Score Color Red  Assess: if the MEWS score is Yellow or Red  Were vital signs taken at a resting state? Yes  Focused Assessment No change from prior assessment  Early Detection of Sepsis Score *See Row Information* Medium  MEWS guidelines implemented *See Row Information* Yes  Treat  MEWS Interventions Administered scheduled meds/treatments  Pain Scale 0-10  Pain Score 0  Take Vital Signs  Increase Vital Sign Frequency  Red: Q 1hr X 4 then Q 4hr X 4, if remains red, continue Q 4hrs  Escalate  MEWS: Escalate Red: discuss with charge nurse/RN and provider, consider discussing with RRT  Notify: Charge Nurse/RN  Name of Charge Nurse/RN Notified Tobi Bastos, Consulting civil engineer  Date Charge Nurse/RN Notified 10/20/20  Time Charge Nurse/RN Notified 2203  Notify: Provider  Provider Name/Title T. Opyd, MD  Date Provider Notified 10/20/20  Time Provider Notified 2245  Notification Type Page  Notification Reason Change in status (Red MEWS)  Response No new orders  Date of Provider Response 10/20/20  Time of Provider Response 2248  Document  Patient Outcome Stabilized after interventions  Progress note created (see row info) Yes

## 2020-10-20 NOTE — ED Notes (Signed)
Date and time results received: 10/20/20 6:18 PM   Test: Lactic Acid  Critical Value: 2.0  Name of Provider Notified: Dairl Ponder MD  Orders Received? Or Actions Taken?: none at this time

## 2020-10-20 NOTE — H&P (Signed)
History and Physical        Hospital Admission Note Date: 10/20/2020  Patient name: Antonio [name] Medical record number: [id number] Date of birth: Dec 19, 1940 Age: 80 y.o. Gender: male  PCP: [name], DO  Patient coming from: Home Lives with: Wife At baseline, ambulates: Independently  Chief Complaint    Chief Complaint  Patient presents with  . Cough  . 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 Per patient's wife, the patient started feeling like he had a head cold about a week and a half prior to Christmas and then by Christmas felt better. Then soon after that she noticed that he was much more tired and sleeping more frequently. He tested positive with a home Covid test about 8 days ago. Soon after that he had a decline in his health with shortness of breath and fevers, recently up to 104 F and noted generalized shaking and worsening shortness of breath. This prompted him to call EMS. Upon EMS arrival patient's O2 sat dropped to 86% and was placed on 2 L/min.   ED Course: Afebrile, hemodynamically stable, placed on 6 L/min. Notable Labs: Sodium 135, K4.1, BUN 9, creatinine 0.57, AST 75, ALT 67, LDH 237, troponin 9, triglycerides 108, lactic acid 0.8, negative procalcitonin, WBC 6.6, Hb 6.3, MCV 101, platelets 291, D-dimer 0.47, fibrinogen 655, FOBT negative. Notable Imaging: CXR-probable moderate pleural effusion and  associated atelectasis with patchy interstitial prominence in the remainder of the lungs.  2 units PRBCs ordered   Vitals:   10/20/20 1739 10/20/20 1740  BP: (!) 124/93   Pulse:  89  Resp: (!) 34 20  Temp:    SpO2:  100%     Review of Systems:  Review of Systems  All other systems reviewed and are negative.   Medical/Social/Family History   Past Medical History: Past Medical History:  Diagnosis Date  . Age-related macular degeneration, dry, both eyes   . 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 Carotid stenosis, asymptomatic, left ?  . ED (erectile dysfunction)   . Elevated diaphragm    right  . Erectile dysfunction   . History of basal cell carcinoma (BCC) excision    multiple excision in office  . History of kidney stones   . History of seizure    03-03-2020 per pt x1 while taking interferan for melanoma in 2001 ,  none since, told a side effect of interferan  . History of squamous cell carcinoma excision    multiple skin excision in office  . Hyperlipidemia   . Hypertension    followed by  pcp  (03-03-2020 per pt had a stress test approx. 2010, told normal)  . LBBB (left bundle branch block)   . Lymphoma of testis ([place]) 2000   s/p right orchiectomy   . Mass of left testis   . MGUS (monoclonal gammopathy of unknown significance)    oncologist--- dr Irene Limbo  . Personal history of malignant melanoma of skin dermatologist--- dr Wilhemina Bonito   dx 2001 left calf area  s/p WLE ,  completed one year interferan (03-03-2020 pt states did not involve lymph nodes, no recurrence)  . Urgency of urination   . Wears hearing aid in both ears     Past Surgical History:  Procedure Laterality Date  . CATARACT EXTRACTION W/ INTRAOCULAR LENS IMPLANT Bilateral 2018  . IR IMAGING GUIDED PORT  INSERTION  03/31/2020  . MELANOMA EXCISION  10/1999  . ORCHIECTOMY Right 2000  . ORCHIECTOMY Left 03/05/2020   Procedure: ORCHIECTOMY;  Surgeon: [name], MD;  Location: [facility];  Service: Urology;  Laterality: Left;  . TONSILLECTOMY  child  . TOTAL KNEE ARTHROPLASTY Right 06/2009    Medications: Prior to Admission medications   Medication Sig Start Date End Date Taking? Authorizing Provider  albuterol (VENTOLIN HFA) 108 (90 Base) MCG/ACT inhaler Inhale 2 puffs into the lungs every 6 (six) hours as needed for shortness of breath. 10/19/20   Reed, Tiffany L, DO  ascorbic acid (VITAMIN C) 500 MG tablet Take 1 tablet (500 mg total) by mouth daily. 10/19/20   Reed, Tiffany L, DO  benzonatate (TESSALON) 100 MG capsule Take 1 capsule (100 mg total) by mouth 3 (three) times daily as needed for cough. 10/15/20   Reed, Tiffany L, DO  calcium carbonate (TUMS - DOSED IN MG ELEMENTAL CALCIUM) 500 MG chewable tablet Chew 1 tablet by mouth as needed for indigestion or heartburn.    [provider]  chlorpheniramine-HYDROcodone (TUSSIONEX PENNKINETIC ER) 10-8 MG/5ML SUER Take 5 mLs by mouth at bedtime as needed for cough. 10/19/20   Reed, Tiffany L, DO  Cholecalciferol (VITAMIN D3) 2000 units TABS Take 1 tablet by mouth daily.    [provider]  Cholecalciferol 100 MCG (4000 UT) CAPS Take 1 capsule (4,000 Units total) by mouth daily. 10/19/20   Reed, Tiffany L, DO  Cyanocobalamin (B-12 PO) Take 1 tablet by mouth daily.    [provider]  diphenhydramine-acetaminophen (TYLENOL PM) 25-500 MG TABS tablet Take 1 tablet by mouth at bedtime as needed.    [provider]  Ketotifen Fumarate (REFRESH EYE ITCH RELIEF OP) Place 1 drop into both eyes daily as needed (itchy eyes).     [provider]  losartan (COZAAR) 100 MG tablet Take 1 tablet (100 mg total) by mouth daily. 02/17/20   Reed, Tiffany L, DO  meloxicam (MOBIC) 7.5 MG tablet TAKE 1 TABLET (7.5 MG  TOTAL) BY MOUTH 2 (TWO) TIMES DAILY AS NEEDED FOR PAIN. 09/07/20   Reed, Tiffany L, DO  Multiple Vitamins-Minerals (PRESERVISION AREDS 2 PO) Take 1 capsule by mouth 2 (two) times daily.     [provider]  propranolol (INDERAL) 10 MG tablet 1 tablet up to three times a day for tremor 09/17/20   Reed, Tiffany L, DO  simvastatin (ZOCOR) 40 MG tablet TAKE 1 TABLET BY MOUTH EVERY DAY 06/24/20   Reed, Tiffany L, DO  Testosterone 20.25 MG/ACT (1.62%) GEL SMARTSIG:40.5 Milligram(s) Topical Every Morning 03/23/20   [provider]  valACYclovir (VALTREX) 1000 MG tablet Take one tablet  by mouth twice daily for 3 days as needed for flares. 09/09/20   Reed, Tiffany L, DO  zinc gluconate 50 MG tablet Take 2 tablets (100 mg total) by mouth daily. 10/19/20   Reed, Tiffany L, DO    Allergies:   Allergies  Allergen Reactions  . Latex Itching    Latex tape causes itching and redness.    Social History:  reports that he has never smoked. He has never used smokeless tobacco. He reports current alcohol use of about 14.0 standard drinks of alcohol per week. He reports that he does not use drugs.  Family History: Family History  Problem Relation Age of Onset  . Cataracts Mother   . Transient ischemic attack Mother   . Asthma Mother   . Cataracts Father   . Diabetes Father        borderline  . Hypertension Father   . Hyperlipidemia Father   . Hyperlipidemia Son   . Hypertension Son   . Amblyopia Neg Hx   . Blindness Neg Hx   . Glaucoma Neg Hx   . Macular degeneration Neg Hx   . Retinal detachment Neg Hx   . Strabismus Neg Hx   . Retinitis pigmentosa Neg Hx      Objective   Physical Exam: Blood pressure (!) 124/93, pulse 89, temperature 99 F (37.2 C), temperature source Oral, resp. rate 20, SpO2 100 %.  Physical Exam Vitals and nursing note reviewed.  Constitutional:      General: He is not in acute distress.    Appearance: Normal appearance. He is well-developed.  HENT:      Head: Normocephalic and atraumatic.  Eyes:     Conjunctiva/sclera: Conjunctivae normal.  Cardiovascular:     Rate and Rhythm: Normal rate and regular rhythm.  Pulmonary:     Effort: Pulmonary effort is normal. No tachypnea.  Chest:     Comments: Port-A-Cath Abdominal:     General: Abdomen is flat.     Palpations: Abdomen is soft.  Musculoskeletal:        General: No swelling or tenderness.     Right lower leg: No tenderness. No edema.     Left lower leg: No tenderness. No edema.  Skin:    Coloration: Skin is not jaundiced or pale.  Neurological:     Mental Status: He is alert. Mental status is at baseline.  Psychiatric:        Mood and Affect: Mood is anxious.     LABS on Admission: I have personally reviewed all the labs and imaging below    Basic Metabolic Panel: Recent Labs  Lab 10/20/20 1400  NA 135  K 4.1  CL 98  CO2 26  GLUCOSE 100*  BUN 9  CREATININE 0.57*  CALCIUM 8.6*   Liver Function Tests: Recent Labs  Lab 10/20/20 1400  AST 75*  ALT 67*  ALKPHOS 45  BILITOT 0.4  PROT 6.2*  ALBUMIN 3.3*   No results for input(s): LIPASE, AMYLASE in the last 168 hours. No results for input(s): AMMONIA in the last 168 hours. CBC: Recent Labs  Lab 10/20/20 1400  WBC 6.6  NEUTROABS 5.0  HGB 6.3*  HCT 19.5*  MCV 101.0*  PLT 291   Cardiac Enzymes: No results for input(s): CKTOTAL, CKMB, CKMBINDEX, TROPONINI in the last 168 hours. BNP: Invalid input(s): POCBNP CBG: No results for input(s): GLUCAP in the last 168 hours.  Radiological Exams on Admission:  DG Chest Port 1 View  Result Date:  10/20/2020 CLINICAL DATA:  Recent COVID positive, new shortness of breath EXAM: PORTABLE CHEST 1 VIEW COMPARISON:  None. FINDINGS: Right chest wall port catheter tip overlies SVC. Opacification of the right mid and lower lung likely reflects combination of moderate pleural effusion and atelectasis. Patchy interstitial prominence in the remainder of the right lung as well  as the left lung. No pneumothorax. Cardiomediastinal contours are partially obscured but otherwise unremarkable. IMPRESSION: Probable moderate pleural effusion and associated atelectasis. Patchy interstitial prominence in the remainder of the lungs may reflect edema or pneumonia. Electronically Signed   By: Macy Mis M.D.   On: 10/20/2020 14:30      EKG: unchanged from previous tracings, LBBB   A & P   Principal Problem:   Acute respiratory failure with hypoxia (HCC) Active Problems:   Benign essential HTN   History of B-cell lymphoma   Symptomatic anemia   Pleural effusion   1. Acute hypoxic respiratory failure, multifactorial: Suspected COVID-19, moderate right-sided pleural effusion, symptomatic anemia and concern for CAP a. SPO2 86% on room air at bedside requiring 6 L/min, tested positive on home test 8 days ago b. Vaccinated but immunocompromised due to cancer history with fever up to 104 F at home c. CXR-probable moderate pleural effusion and associated atelectasis -> CT chest without contrast for further evaluation.  Consider PCCM consult for thoracentesis pending findings d. Check Covid and flu swab to formally diagnose e. Start remdesivir f. Start steroids g. Empiric cefepime and doxycycline h. Blood cultures i. As needed inhalers and antitussives j. 2 units PRBCs ordered in the ED  2. Symptomatic macrocytic anemia of unknown etiology a. Hb 13.4-> 6.3 since November b. No signs of bleeding, negative FOBT c. Discussed with Dr. Irene Limbo, oncology->not from chemo from September d. 2 units PRBC transfusion   3. Hypertension a. Continue losartan  4. Hyperlipidemia a. Continue statin  5. History of diffuse large B-cell lymphoma s/p orchiectomy a. Follows with Dr. Irene Limbo b. On testosterone therapy   DVT prophylaxis: SCDs   Code Status: DNR  Diet: Heart healthy Family Communication: Admission, patients condition and plan of care including tests being ordered have  been discussed with the patient who indicates understanding and agrees with the plan and Code Status. Patient's wife was updated  Disposition Plan: The appropriate patient status for this patient is INPATIENT. Inpatient status is judged to be reasonable and necessary in order to provide the required intensity of service to ensure the patient's safety. The patient's presenting symptoms, physical exam findings, and initial radiographic and laboratory data in the context of their chronic comorbidities is felt to place them at high risk for further clinical deterioration. Furthermore, it is not anticipated that the patient will be medically stable for discharge from the hospital within 2 midnights of admission. The following factors support the patient status of inpatient.   " The patient's presenting symptoms include shortness of breath, fatigue, cough. " The worrisome physical exam findings include hypoxia. " The initial radiographic and laboratory data are worrisome because of moderate right pleural effusion, elevated inflammatory markers, Hb 6.3. " The chronic co-morbidities include diffuse large B-cell lymphoma.   * I certify that at the point of admission it is my clinical judgment that the patient will require inpatient hospital care spanning beyond 2 midnights from the point of admission due to high intensity of service, high risk for further deterioration and high frequency of surveillance required.*    The medical decision making on this patient was of  high complexity and the patient is at high risk for clinical deterioration, therefore this is a level 3  admission.  Consultants  . Discussed with oncology  Procedures  . 2 unit PRBC transfusion  Time Spent on Admission: 80 minutes    Jae Dire, DO [facility]  10/20/2020, 7:08 PM

## 2020-10-20 NOTE — ED Provider Notes (Signed)
[facility] DEPT Provider Note   CSN: [id number] Arrival date & time: 10/20/20  1257     History Chief Complaint  Patient presents with  . Cough  . Shortness of Breath    Antonio [name] is a 80 y.o. male.  The history is provided by the patient and medical records. No language interpreter was used.  Shortness of Breath Severity:  Moderate Onset quality:  Gradual Duration:  3 days Timing:  Intermittent Progression:  Waxing and waning Chronicity:  New Context: URI   Relieved by:  Nothing Worsened by:  Coughing Ineffective treatments:  None tried Associated symptoms: chest pain, cough and sputum production   Associated symptoms: no abdominal pain, no claudication, no diaphoresis, no fever, no headaches, no hemoptysis, no swollen glands, no vomiting and no wheezing   Risk factors: hx of cancer        Past Medical History:  Diagnosis Date  . Age-related macular degeneration, dry, both eyes   . 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 Carotid stenosis, asymptomatic, left ?  . ED (erectile dysfunction)   . Elevated diaphragm    right  . Erectile dysfunction   . History of basal cell carcinoma (BCC) excision    multiple excision in office  . History of kidney stones   . History of seizure    03-03-2020 per pt x1 while taking interferan for melanoma in 2001 ,  none since, told a side effect of interferan  . History of squamous cell carcinoma excision    multiple skin excision in office  . Hyperlipidemia   . Hypertension    followed by pcp  (03-03-2020 per pt had a stress test approx. 2010, told normal)  . LBBB (left bundle branch block)   . Lymphoma of testis ([place]) 2000   s/p right orchiectomy   . Mass of left testis   . MGUS (monoclonal  gammopathy of unknown significance)    oncologist--- dr Irene Limbo  . Personal history of malignant melanoma of skin dermatologist--- dr Wilhemina Bonito   dx 2001 left calf area  s/p WLE ,  completed one year interferan (03-03-2020 pt states did not involve lymph nodes, no recurrence)  . Urgency of urination   . 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 Testosterone deficiency 09/09/2020  . S/P orchiectomy 09/09/2020  . Diffuse large B cell lymphoma ([place]) 03/17/2020  . Testicular malignant neoplasm ([place]) 03/17/2020  . Counseling regarding advance care planning and goals of care 03/17/2020  . Hyperglycemia 09/05/2018  . Mass of soft tissue of left lower extremity 09/05/2018  . Monoclonal gammopathy of unknown significance (MGUS) 09/05/2018  . Macrocytosis without anemia 09/05/2018  . 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 Slow transit constipation 06/21/2017  . Sleep-wake disorder 06/21/2017  . Benign essential HTN 06/21/2017  . Pure hypercholesterolemia 06/21/2017  . History of B-cell lymphoma 06/21/2017  . Personal history of malignant melanoma 06/21/2017  . Left carotid artery stenosis 06/21/2017  . DDD (degenerative disc disease), cervical 06/21/2017  . History of kidney stones 06/21/2017  . Tubular adenoma of colon 06/21/2017  . 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  . Vitamin D deficiency 06/21/2017    Past Surgical History:  Procedure Laterality Date  .  CATARACT EXTRACTION W/ INTRAOCULAR LENS IMPLANT Bilateral 2018  . IR IMAGING GUIDED PORT INSERTION  03/31/2020  . MELANOMA EXCISION  10/1999  . ORCHIECTOMY Right 2000  . ORCHIECTOMY Left 03/05/2020   Procedure: ORCHIECTOMY;  Surgeon: [name], MD;  Location: [facility];  Service: Urology;  Laterality: Left;  . TONSILLECTOMY  child  . TOTAL KNEE ARTHROPLASTY Right 06/2009       Family History  Problem Relation Age of Onset  . Cataracts Mother   .  Transient ischemic attack Mother   . Asthma Mother   . Cataracts Father   . Diabetes Father        borderline  . Hypertension Father   . Hyperlipidemia Father   . Hyperlipidemia Son   . Hypertension Son   . Amblyopia Neg Hx   . Blindness Neg Hx   . Glaucoma Neg Hx   . Macular degeneration Neg Hx   . Retinal detachment Neg Hx   . Strabismus Neg Hx   . Retinitis pigmentosa Neg Hx     Social History   Tobacco Use  . Smoking status: Never Smoker  . Smokeless tobacco: Never Used  Vaping Use  . Vaping Use: Never used  Substance Use Topics  . Alcohol use: Yes    Alcohol/week: 14.0 standard drinks    Types: 14 Glasses of wine per week    Comment: 2 wine daily  . Drug use: Never    Home Medications Prior to Admission medications   Medication Sig Start Date End Date Taking? Authorizing Provider  albuterol (VENTOLIN HFA) 108 (90 Base) MCG/ACT inhaler Inhale 2 puffs into the lungs every 6 (six) hours as needed for shortness of breath. 10/19/20   Reed, Tiffany L, DO  ascorbic acid (VITAMIN C) 500 MG tablet Take 1 tablet (500 mg total) by mouth daily. 10/19/20   Reed, Tiffany L, DO  benzonatate (TESSALON) 100 MG capsule Take 1 capsule (100 mg total) by mouth 3 (three) times daily as needed for cough. 10/15/20   Reed, Tiffany L, DO  calcium carbonate (TUMS - DOSED IN MG ELEMENTAL CALCIUM) 500 MG chewable tablet Chew 1 tablet by mouth as needed for indigestion or heartburn.    [provider]  chlorpheniramine-HYDROcodone (TUSSIONEX PENNKINETIC ER) 10-8 MG/5ML SUER Take 5 mLs by mouth at bedtime as needed for cough. 10/19/20   Reed, Tiffany L, DO  Cholecalciferol (VITAMIN D3) 2000 units TABS Take 1 tablet by mouth daily.    [provider]  Cholecalciferol 100 MCG (4000 UT) CAPS Take 1 capsule (4,000 Units total) by mouth daily. 10/19/20   Reed, Tiffany L, DO  Cyanocobalamin (B-12 PO) Take 1 tablet by mouth daily.    [provider]  diphenhydramine-acetaminophen  (TYLENOL PM) 25-500 MG TABS tablet Take 1 tablet by mouth at bedtime as needed.    [provider]  Ketotifen Fumarate (REFRESH EYE ITCH RELIEF OP) Place 1 drop into both eyes daily as needed (itchy eyes).     [provider]  losartan (COZAAR) 100 MG tablet Take 1 tablet (100 mg total) by mouth daily. 02/17/20   Reed, Tiffany L, DO  meloxicam (MOBIC) 7.5 MG tablet TAKE 1 TABLET (7.5 MG TOTAL) BY MOUTH 2 (TWO) TIMES DAILY AS NEEDED FOR PAIN. 09/07/20   Reed, Tiffany L, DO  Multiple Vitamins-Minerals (PRESERVISION AREDS 2 PO) Take 1 capsule by mouth 2 (two) times daily.     [provider]  propranolol (INDERAL) 10 MG tablet 1 tablet  up to three times a day for tremor 09/17/20   Reed, Tiffany L, DO  simvastatin (ZOCOR) 40 MG tablet TAKE 1 TABLET BY MOUTH EVERY DAY 06/24/20   Reed, Tiffany L, DO  Testosterone 20.25 MG/ACT (1.62%) GEL SMARTSIG:40.5 Milligram(s) Topical Every Morning 03/23/20   [provider]  valACYclovir (VALTREX) 1000 MG tablet Take one tablet by mouth twice daily for 3 days as needed for flares. 09/09/20   Reed, Tiffany L, DO  zinc gluconate 50 MG tablet Take 2 tablets (100 mg total) by mouth daily. 10/19/20   Reed, Tiffany L, DO    Allergies    Latex  Review of Systems   Review of Systems  Constitutional: Positive for fatigue. Negative for chills, diaphoresis and fever.  HENT: Negative for congestion.   Respiratory: Positive for cough, sputum production, chest tightness and shortness of breath. Negative for hemoptysis, wheezing and stridor.   Cardiovascular: Positive for chest pain. Negative for claudication.  Gastrointestinal: Negative for abdominal pain, constipation, diarrhea, nausea and vomiting.  Genitourinary: Negative for flank pain.  Musculoskeletal: Negative for back pain.  Neurological: Negative for light-headedness and headaches.  Psychiatric/Behavioral: Negative for agitation.  All other systems reviewed and are  negative.   Physical Exam Updated Vital Signs BP 109/74   Pulse 84   Temp 99 F (37.2 C) (Oral)   Resp (!) 24   SpO2 100%   Physical Exam Vitals and nursing note reviewed.  Constitutional:      General: He is not in acute distress.    Appearance: He is well-developed and well-nourished. He is not ill-appearing, toxic-appearing or diaphoretic.  HENT:     Head: Normocephalic and atraumatic.  Eyes:     Conjunctiva/sclera: Conjunctivae normal.     Pupils: Pupils are equal, round, and reactive to light.  Cardiovascular:     Rate and Rhythm: Normal rate and regular rhythm.     Heart sounds: No murmur heard.   Pulmonary:     Effort: Pulmonary effort is normal. Tachypnea present. No respiratory distress.     Breath sounds: Rhonchi present. No decreased breath sounds, wheezing or rales.  Chest:     Chest wall: No tenderness.  Abdominal:     Palpations: Abdomen is soft.     Tenderness: There is no abdominal tenderness.  Musculoskeletal:        General: No edema.     Cervical back: Neck supple.     Right lower leg: No tenderness. No edema.     Left lower leg: No tenderness. No edema.  Skin:    General: Skin is warm and dry.     Capillary Refill: Capillary refill takes less than 2 seconds.     Findings: No erythema.  Neurological:     General: No focal deficit present.     Mental Status: He is alert.  Psychiatric:        Mood and Affect: Mood and affect and mood normal.     ED Results / Procedures / Treatments   Labs (all labs ordered are listed, but only abnormal results are displayed) Labs Reviewed  CBC WITH DIFFERENTIAL/PLATELET - Abnormal; Notable for the following components:      Result Value   RBC 1.93 (*)    Hemoglobin 6.3 (*)    HCT 19.5 (*)    MCV 101.0 (*)    All other components within normal limits  COMPREHENSIVE METABOLIC PANEL - Abnormal; Notable for the following components:   Glucose, Bld 100 (*)  Creatinine, Ser 0.57 (*)    Calcium 8.6 (*)     Total Protein 6.2 (*)    Albumin 3.3 (*)    AST 75 (*)    ALT 67 (*)    All other components within normal limits  LACTATE DEHYDROGENASE - Abnormal; Notable for the following components:   LDH 237 (*)    All other components within normal limits  FIBRINOGEN - Abnormal; Notable for the following components:   Fibrinogen 655 (*)    All other components within normal limits  CULTURE, BLOOD (ROUTINE X 2)  CULTURE, BLOOD (ROUTINE X 2)  LACTIC ACID, PLASMA  D-DIMER, QUANTITATIVE (NOT AT [facility])  PROCALCITONIN  TRIGLYCERIDES  LACTIC ACID, PLASMA  FERRITIN  C-REACTIVE PROTEIN  POC OCCULT BLOOD, ED  PREPARE RBC (CROSSMATCH)  TROPONIN I (HIGH SENSITIVITY)  TROPONIN I (HIGH SENSITIVITY)    EKG EKG Interpretation  Date/Time:  Tuesday October 20 2020 13:29:11 EST Ventricular Rate:  84 PR Interval:    QRS Duration: 139 QT Interval:  393 QTC Calculation: 465 R Axis:   -72 Text Interpretation: Sinus rhythm Left bundle branch block 12 Lead; [place] No prior ECG for comparison. no STEMI Confirmed by Antony Blackbird [id number]) on 10/20/2020 1:43:33 PM   Radiology DG Chest Port 1 View  Result Date: 10/20/2020 CLINICAL DATA:  Recent COVID positive, new shortness of breath EXAM: PORTABLE CHEST 1 VIEW COMPARISON:  None. FINDINGS: Right chest wall port catheter tip overlies SVC. Opacification of the right mid and lower lung likely reflects combination of moderate pleural effusion and atelectasis. Patchy interstitial prominence in the remainder of the right lung as well as the left lung. No pneumothorax. Cardiomediastinal contours are partially obscured but otherwise unremarkable. IMPRESSION: Probable moderate pleural effusion and associated atelectasis. Patchy interstitial prominence in the remainder of the lungs may reflect edema or pneumonia. Electronically Signed   By: Macy Mis M.D.   On: 10/20/2020 14:30    Procedures Procedures (including critical care time)  CRITICAL CARE Performed  by: Gwenyth Allegra Darragh Nay Total critical care time: 35 minutes Critical care time was exclusive of separately billable procedures and treating other patients. Critical care was necessary to treat or prevent imminent or life-threatening deterioration. Critical care was time spent personally by me on the following activities: development of treatment plan with patient and/or surrogate as well as nursing, discussions with consultants, evaluation of patient's response to treatment, examination of patient, obtaining history from patient or surrogate, ordering and performing treatments and interventions, ordering and review of laboratory studies, ordering and review of radiographic studies, pulse oximetry and re-evaluation of patient's condition.   Medications Ordered in ED Medications  0.9 %  sodium chloride infusion (has no administration in time range)    ED Course  I have reviewed the triage vital signs and the nursing notes.  Pertinent labs & imaging results that were available during my care of the patient were reviewed by me and considered in my medical decision making (see chart for details).    MDM Rules/Calculators/A&P                          JAYLEN ORNE is a 80 y.o. male with a past medical history significant for hypertension, hypercholesterolemia, carotid disease, kidney stones, MGUS, diffuse large B-cell lymphoma, testicular neoplasm status post orchiectomy, and prior melanoma who was recently diagnosed with COVID-19 8 days ago who presents with several days of worsening shortness of breath and occasional chest tightness  and new hypoxia.  According to EMS report to nursing, patient does not take oxygen at home and his had oxygen saturations dropping into the 80s today.  He is reporting 3 days of some intermittent shortness of breath but only several hours of the chest discomfort coming and going.  He reports he is currently not having chest pain or shortness breath now that he is on  4 L nasal cannula to maintain oxygen saturation.  He was initially on 6 L with EMS reportedly.  Patient reports it is more of a tightness but would not describe it as chest pain.  He denies any trauma.  He reports he is having some productive cough but denies hemoptysis.  He denies headache or neck pain.  Denies any nausea, vomiting, constipation, or diarrhea.  His last chemotherapy treatment was 4 weeks ago based on the chart.  On exam, lungs have some coarseness.  Chest is nontender.  Abdomen is nontender.  Pulses present in extremities.  No lower extremity tenderness or swelling and he denies any lower extremity symptoms.  He reports he is breathing better and feeling better now he is on oxygen.  He was tachypneic on arrival but is not tachycardic or hypotensive.  EKG shows no STEMI.  Clinically I am concerned about the patient's hypoxia and worsening symptoms in the last several days.  He is now on 4 L nasal cannula so anticipate he will need admission however, as this is day 13 of his illness and it has been worsening the last few days with new chest tightness, I am concerned need to rule out pulmonary embolism given his history of multiple types of cancer.  We will do a D-dimer with the order set but if it is positive, anticipate CT PE study.  We will get other labs including troponin.  Anticipate admission for hypoxia in the setting of COVID-19 in somebody who is on chemotherapy once we rule out pulmonary embolism or other new abnormality.       3:34 PM Patient's labs began to return and unfortunately he now has a significant anemia.  Hemoglobin down to 6.3 down from 13.42 months ago.  He denies any dark tarry stools or rectal bleeding but is of a nosebleed the other day.  He reports was a very small amount of family agrees when I spoke to them.  They agree with blood transfusion and admission when work-up is completed.  D-dimer is negative, doubt PE at this time.  Suspect symptoms are all related  to symptomatic anemia.  Will get fecal occult test and will call her admission for symptomatic anemia causing severe fatigue and shortness of breath and hypoxia in the setting of COVID-19.  3:57 PM Fecal occult test just returned negative.  Do not think this is a acute GI bleed based on this and his lack of any reported rectal bleeding.  Unclear etiology at this time but I do suspect symptomatic anemia.  Patient was given blood and admitted for further management.   Final Clinical Impression(s) / ED Diagnoses Final diagnoses:  COVID-19  Symptomatic anemia  Hypoxia    Clinical Impression: 1. COVID-19   2. Symptomatic anemia   3. Hypoxia     Disposition: Admit  This note was prepared with assistance of Dragon voice recognition software. Occasional wrong-word or sound-a-like substitutions may have occurred due to the inherent limitations of voice recognition software.     Xavia Kniskern, Canary Brim, MD 10/20/20 [phone]

## 2020-10-21 DIAGNOSIS — U071 COVID-19: Principal | ICD-10-CM

## 2020-10-21 DIAGNOSIS — J9601 Acute respiratory failure with hypoxia: Secondary | ICD-10-CM | POA: Diagnosis not present

## 2020-10-21 DIAGNOSIS — D649 Anemia, unspecified: Secondary | ICD-10-CM | POA: Diagnosis not present

## 2020-10-21 DIAGNOSIS — Z8572 Personal history of non-Hodgkin lymphomas: Secondary | ICD-10-CM | POA: Diagnosis not present

## 2020-10-21 LAB — CBC WITH DIFFERENTIAL/PLATELET
Abs Immature Granulocytes: 0.05 10*3/uL (ref 0.00–0.07)
Basophils Absolute: 0 10*3/uL (ref 0.0–0.1)
Basophils Relative: 0 %
Eosinophils Absolute: 0 10*3/uL (ref 0.0–0.5)
Eosinophils Relative: 0 %
HCT: 43.9 % (ref 39.0–52.0)
Hemoglobin: 14.1 g/dL (ref 13.0–17.0)
Immature Granulocytes: 1 %
Lymphocytes Relative: 3 %
Lymphs Abs: 0.2 10*3/uL — ABNORMAL LOW (ref 0.7–4.0)
MCH: 31.8 pg (ref 26.0–34.0)
MCHC: 32.1 g/dL (ref 30.0–36.0)
MCV: 99.1 fL (ref 80.0–100.0)
Monocytes Absolute: 0.4 10*3/uL (ref 0.1–1.0)
Monocytes Relative: 8 %
Neutro Abs: 4.2 10*3/uL (ref 1.7–7.7)
Neutrophils Relative %: 88 %
Platelets: 184 10*3/uL (ref 150–400)
RBC: 4.43 MIL/uL (ref 4.22–5.81)
RDW: 13.3 % (ref 11.5–15.5)
WBC: 4.7 10*3/uL (ref 4.0–10.5)
nRBC: 0 % (ref 0.0–0.2)

## 2020-10-21 LAB — TYPE AND SCREEN
ABO/RH(D): A POS
Antibody Screen: NEGATIVE

## 2020-10-21 LAB — COMPREHENSIVE METABOLIC PANEL
ALT: 88 U/L — ABNORMAL HIGH (ref 0–44)
AST: 98 U/L — ABNORMAL HIGH (ref 15–41)
Albumin: 3 g/dL — ABNORMAL LOW (ref 3.5–5.0)
Alkaline Phosphatase: 56 U/L (ref 38–126)
Anion gap: 10 (ref 5–15)
BUN: 7 mg/dL — ABNORMAL LOW (ref 8–23)
CO2: 27 mmol/L (ref 22–32)
Calcium: 8.3 mg/dL — ABNORMAL LOW (ref 8.9–10.3)
Chloride: 99 mmol/L (ref 98–111)
Creatinine, Ser: 0.68 mg/dL (ref 0.61–1.24)
GFR, Estimated: >60 mL/min (ref >60)
Glucose, Bld: 150 mg/dL — ABNORMAL HIGH (ref 70–99)
Potassium: 4.6 mmol/L (ref 3.5–5.1)
Sodium: 136 mmol/L (ref 135–145)
Total Bilirubin: 0.9 mg/dL (ref 0.3–1.2)
Total Protein: 6.2 g/dL — ABNORMAL LOW (ref 6.5–8.1)

## 2020-10-21 LAB — FERRITIN: Ferritin: 1079 ng/mL — ABNORMAL HIGH (ref 24–336)

## 2020-10-21 LAB — MRSA PCR SCREENING: MRSA by PCR: NEGATIVE

## 2020-10-21 LAB — HEMOGLOBIN AND HEMATOCRIT, BLOOD
HCT: 42.3 % (ref 39.0–52.0)
Hemoglobin: 14.2 g/dL (ref 13.0–17.0)

## 2020-10-21 LAB — C-REACTIVE PROTEIN: CRP: 10.7 mg/dL — ABNORMAL HIGH (ref <1.0)

## 2020-10-21 LAB — D-DIMER, QUANTITATIVE: D-Dimer, Quant: 0.55 ug/mL-FEU — ABNORMAL HIGH (ref 0.00–0.50)

## 2020-10-21 MED ORDER — METHYLPREDNISOLONE SODIUM SUCC 125 MG IJ SOLR
60.0000 mg | Freq: Two times a day (BID) | INTRAMUSCULAR | Status: DC
  Administered 2020-10-21 – 2020-10-23 (×5): 60 mg via INTRAVENOUS
  Filled 2020-10-21 (×5): qty 2

## 2020-10-21 MED ORDER — PANTOPRAZOLE SODIUM 40 MG PO TBEC
40.0000 mg | DELAYED_RELEASE_TABLET | Freq: Every day | ORAL | Status: DC
  Administered 2020-10-21 – 2020-10-23 (×3): 40 mg via ORAL
  Filled 2020-10-21 (×3): qty 1

## 2020-10-21 MED ORDER — ENOXAPARIN SODIUM 40 MG/0.4ML ~~LOC~~ SOLN
40.0000 mg | SUBCUTANEOUS | Status: DC
  Administered 2020-10-21 – 2020-10-22 (×2): 40 mg via SUBCUTANEOUS
  Filled 2020-10-21 (×2): qty 0.4

## 2020-10-21 NOTE — Progress Notes (Signed)
PROGRESS NOTE                                                                                                                                                                                                             Patient Demographics:    Antonio [name], is a 80 y.o. male, DOB - Feb 21, 1941, [id number]  Outpatient Primary MD for the patient is [name], DO   Admit date - 10/20/2020   LOS - 1  Chief Complaint  Patient presents with  . Cough  . Shortness of Breath       Brief Narrative: Patient is a 80 y.o. 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  COVID-19 vaccinated status: Vaccinated including booster  Significant Events: 12/29>> Covid positive (per prior documentation) 1/4>> admit to [facility] for hypoxia due to COVID-19 pneumonia  Significant studies: 1/4>>Chest x-ray: Patchy interstitial prominence-likely pneumonia 1/4>> CT chest: Multifocal pneumonia, right hemidiaphragm.  COVID-19 medications: Steroids: 1/4>> Remdesivir: 1/4>>  Antibiotics: Doxycycline: 1/4>> 1/5 Cefepime: 1/4>> x1  Microbiology data: 1/4 >>blood culture: Negative  Procedures: None  Consults: None  DVT prophylaxis: enoxaparin (LOVENOX) injection 40 mg Start: 10/21/20 1615 SCDs Start: 10/20/20 1653    Subjective:    [name] today feels better-was on 3 L of oxygen this morning-was titrated down to room air.   Assessment  & Plan :   Acute Hypoxic Resp Failure due to Covid 19 Viral pneumonia: Improving-minimal to no oxygen requirement this morning-CRP remains significantly  elevated-continue steroids/Remdesivir.  If hypoxia would worsen-we will benefit from either Actemra/baricitinib use.  Fever: afebrile O2 requirements:  SpO2: 94 % O2 Flow Rate (L/min): 3 L/min   COVID-19 Labs: Recent Labs    10/20/20 1400 10/21/20 0348  DDIMER 0.47 0.55*  FERRITIN 721* 1,079*  LDH 237*  --   CRP 7.9* 10.7*    No results found for: BNP  Recent Labs  Lab 10/20/20 1400  PROCALCITON <0.10    Lab Results  Component Value Date   [place] NEGATIVE 03/03/2020     Prone/Incentive Spirometry: encouraged  incentive spirometry use 3-4/hour.  Anemia: Uncertain etiology-?  Lab error-s/p 1 unit of PRBC on admission-hemoglobin went from 6.3 on admission -14.1 this morning (posttransfusion).  No indication of GI loss-FOBT negative.  Apart from observation-doubt further work-up is required as not sure if hemoglobin on admission was not a lab error.  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  Essential  tremor: Continue propanolol   ABG: No results found for: PHART, PCO2ART, PO2ART, HCO3, TCO2, ACIDBASEDEF, O2SAT  Vent Settings: N/A  Condition - Stable  Family Communication  :  Spouse-[name]-updated over the phone 1/5  Code Status:DNR  Diet :  Diet Order            Diet Heart Room service appropriate? Yes; Fluid consistency: Thin  Diet effective now                  Disposition Plan  :   Status is: Inpatient  Remains inpatient appropriate because:Inpatient level of care appropriate due to severity of illness   Dispo: The patient is from: Home              Anticipated d/c is to: Home              Anticipated d/c date is: 1-2 day              Patient currently is not medically stable to d/c.    Barriers to discharge: Hypoxia requiring O2 supplementation/complete 5 days of IV  Remdesivir  Antimicorbials  :    Anti-infectives (From admission, onward)   Start     Dose/Rate Route Frequency Ordered Stop   10/21/20 1000  remdesivir 100 mg in sodium chloride 0.9 % 100 mL IVPB       "Followed by" Linked Group Details   100 mg 200 mL/hr over 30 Minutes Intravenous Daily 10/20/20 1652 10/25/20 0959   10/21/20 0600  ceFEPIme (MAXIPIME) 2 g in sodium chloride 0.9 % 100 mL IVPB  Status:  Discontinued        2 g 200 mL/hr over 30 Minutes Intravenous Every 8 hours 10/20/20 1946 10/21/20 1010   10/20/20 2200  doxycycline (VIBRA-TABS) tablet 100 mg  Status:  Discontinued        100 mg Oral Every 12 hours 10/20/20 1849 10/21/20 1010   10/20/20 1915  ceFEPIme (MAXIPIME) 2 g in sodium chloride 0.9 % 100 mL IVPB        2 g 200 mL/hr over 30 Minutes Intravenous  Once 10/20/20 1904 10/20/20 2349   10/20/20 1800  remdesivir 200 mg in sodium chloride 0.9% 250 mL IVPB       "Followed by" Linked Group Details   200 mg 580 mL/hr over 30 Minutes Intravenous Once 10/20/20 1652 10/20/20 1811      Inpatient Medications  Scheduled Meds: . Chlorhexidine Gluconate Cloth  6 each Topical Daily  . enoxaparin (LOVENOX) injection  40 mg Subcutaneous Q24H  . losartan  100 mg Oral Daily  . methylPREDNISolone (SOLU-MEDROL) injection  60 mg Intravenous Q12H  . pantoprazole  40 mg Oral Q1200  . simvastatin  40 mg Oral QPM  . sodium chloride flush  10-40 mL Intracatheter Q12H   Continuous Infusions: . sodium chloride    . remdesivir 100 mg in NS 100 mL Stopped (10/21/20 0833)   PRN Meds:.acetaminophen, guaiFENesin-dextromethorphan, Ipratropium-Albuterol, propranolol, sodium chloride flush   Time Spent in  minutes  25  See all Orders from today for further details   Jeoffrey Massed M.D on 10/21/2020 at 3:23 PM  To page go to [email or web address] - use universal password  [facility] -  Office  [phone]    Objective:   Vitals:   10/21/20 0407 10/21/20 0809 10/21/20 1023  10/21/20 1219  BP: 123/82 (!) 142/95  116/72  Pulse: 99 97  96  Resp: (!) 23 20  20   Temp: 98 F (36.7 C) 97.8 F (36.6 C)  97.9 F (36.6 C)  TempSrc: Oral Oral  Oral  SpO2: 100% 97% 95% 94%  Weight:      Height:        Wt Readings from Last 3 Encounters:  10/20/20 87 kg  09/09/20 87.7 kg  08/19/20 87.9 kg     Intake/Output Summary (Last 24 hours) at 10/21/2020 1523 Last data filed at 10/21/2020 12/19/2020 Gross per 24 hour  Intake 1065.73 ml  Output 2175 ml  Net -1109.27 ml     Physical Exam Gen Exam:Alert awake-not in any distress HEENT:atraumatic, normocephalic Chest: B/L clear to auscultation anteriorly CVS:S1S2 regular Abdomen:soft non tender, non distended Extremities:no edema Neurology: Non focal Skin: no rash   Data Review:    CBC Recent Labs  Lab 10/20/20 1400 10/21/20 0035 10/21/20 0348  WBC 6.6 4.7  --   HGB 6.3* 14.1 14.2  HCT 19.5* 43.9 42.3  PLT 291 184  --   MCV 101.0* 99.1  --   MCH 32.6 31.8  --   MCHC 32.3 32.1  --   RDW 13.3 13.3  --   LYMPHSABS 0.8 0.2*  --   MONOABS 0.8 0.4  --   EOSABS 0.0 0.0  --   BASOSABS 0.0 0.0  --     Chemistries  Recent Labs  Lab 10/20/20 1400 10/21/20 0348  NA 135 136  K 4.1 4.6  CL 98 99  CO2 26 27  GLUCOSE 100* 150*  BUN 9 7*  CREATININE 0.57* 0.68  CALCIUM 8.6* 8.3*  AST 75* 98*  ALT 67* 88*  ALKPHOS 45 56  BILITOT 0.4 0.9   ------------------------------------------------------------------------------------------------------------------ Recent Labs    10/20/20 1400  TRIG 108    Lab Results  Component Value Date   HGBA1C 5.7 02/27/2020   ------------------------------------------------------------------------------------------------------------------ No results for input(s): TSH, T4TOTAL, T3FREE, THYROIDAB in the last 72 hours.  Invalid input(s): FREET3 ------------------------------------------------------------------------------------------------------------------ Recent Labs     10/20/20 1400 10/21/20 0348  FERRITIN 721* 1,079*    Coagulation profile No results for input(s): INR, PROTIME in the last 168 hours.  Recent Labs    10/20/20 1400 10/21/20 0348  DDIMER 0.47 0.55*    Cardiac Enzymes No results for input(s): CKMB, TROPONINI, MYOGLOBIN in the last 168 hours.  Invalid input(s): CK ------------------------------------------------------------------------------------------------------------------ No results found for: BNP  Micro Results Recent Results (from the past 240 hour(s))  Blood Culture (routine x 2)     Status: None (Preliminary result)   Collection Time: 10/20/20  2:00 PM   Specimen: BLOOD  Result Value Ref Range Status   Specimen Description   Final    BLOOD PORTA CATH Performed at [facility], [address], [place], [place]    Special Requests   Final    BOTTLES DRAWN AEROBIC AND ANAEROBIC Blood Culture adequate volume Performed at [facility], [address], [place], [place]    Culture   Final    NO GROWTH < 12  HOURS Performed at [facility], [place] [address] [place], [place] [address]    Report Status PENDING  Incomplete  Blood Culture (routine x 2)     Status: None (Preliminary result)   Collection Time: 10/20/20  2:00 PM   Specimen: BLOOD  Result Value Ref Range Status   Specimen Description   Final    BLOOD PORTA CATH Performed at [facility] [address] [place], [place] [address]    Special Requests   Final    BOTTLES DRAWN AEROBIC AND ANAEROBIC Blood Culture adequate volume Performed at [facility] [address] [place], [place] [address]    Culture   Final    NO GROWTH < 12 HOURS Performed at [place] [address] [place], [place] [address]    Report Status PENDING  Incomplete  MRSA PCR Screening     Status: None   Collection Time: 10/21/20 12:58 AM  Result Value Ref Range Status    MRSA by PCR NEGATIVE NEGATIVE Final    Comment:        The GeneXpert MRSA Assay (FDA approved for NASAL specimens only), is one component of a comprehensive MRSA colonization surveillance program. It is not intended to diagnose MRSA infection nor to guide or monitor treatment for MRSA infections. Performed at [facility], [place] [address] [place], [place] [address]     Radiology Reports CT CHEST WO CONTRAST  Result Date: 10/20/2020 CLINICAL DATA:  80 year old male with abnormal x-ray and pleural effusion. EXAM: CT CHEST WITHOUT CONTRAST TECHNIQUE: Multidetector CT imaging of the chest was performed following the standard protocol without IV contrast. COMPARISON:  Chest radiograph dated 10/20/2020. FINDINGS: Evaluation of this exam is limited in the absence of intravenous contrast. Cardiovascular: There is no cardiomegaly or pericardial effusion. There is coronary vascular calcification of the LAD. Mild atherosclerotic calcification of the thoracic aorta. No aneurysmal dilatation. The central pulmonary arteries are grossly unremarkable. Mediastinum/Nodes: There is no hilar or mediastinal adenopathy. The esophagus is grossly unremarkable. No mediastinal fluid collection. Right-sided Port-A-Cath with tip in central SVC close to the cavoatrial junction. Lungs/Pleura: There is eventration of the right hemidiaphragm. There are bibasilar patchy ground-glass densities, right greater than left as well as additional scattered ground-glass pulmonary opacities in the upper lobes most consistent with multifocal pneumonia, likely viral or atypical in etiology including COVID-19. Clinical correlation is recommended. There is no pleural effusion pneumothorax. The central airways are patent. Upper Abdomen: A 3 cm right renal upper pole cyst. Musculoskeletal: No chest wall mass or suspicious bone lesions identified. IMPRESSION: Multifocal pneumonia, likely viral or atypical in etiology. Clinical correlation  and follow-up to resolution recommended. Electronically Signed   By: Anner Crete M.D.   On: 10/20/2020 19:35   DG Chest Port 1 View  Result Date: 10/20/2020 CLINICAL DATA:  Recent COVID positive, new shortness of breath EXAM: PORTABLE CHEST 1 VIEW COMPARISON:  None. FINDINGS: Right chest wall port catheter tip overlies SVC. Opacification of the right mid and lower lung likely reflects combination of moderate pleural effusion and atelectasis. Patchy interstitial prominence in the remainder of the right lung as well as the left lung. No pneumothorax. Cardiomediastinal contours are partially obscured but otherwise unremarkable. IMPRESSION: Probable moderate pleural effusion and associated atelectasis. Patchy interstitial prominence in the remainder of the lungs may reflect edema or pneumonia. Electronically Signed   By: Macy Mis M.D.   On: 10/20/2020 14:30

## 2020-10-21 NOTE — Evaluation (Signed)
Physical Therapy Evaluation Patient Details Name: Antonio [name] MRN: [id number] DOB: Jun 27, 1941 Today's Date: 10/21/2020   History of Present Illness  This is an 80 year old male who is vaccinated against COVID-19 with past medical history of left bundle branch block, essential tremor, prior melanoma, right testicular large B-cell lymphoma , hypertension, hyperlipidemia, MGUS who presented to the ED 10/21/19 with cough, chest tightness and shortness of breath, fever.   Tested positive with a home Covid test about 8 days ago.  Clinical Impression  The patient  Resting in bed. Patient with noted increased tremors, patient reports more pronounced. Patient required mood assistance for standing and taking a few steps to recliner. Very unsteady, requires support. Patient's SPO2 from ear in 90's. HR max 114. Patient instructed in IS.  Patient independent /driving PTA.  Pt admitted with above diagnosis.  Pt currently with functional limitations due to the deficits listed below (see PT Problem List). Pt will benefit from skilled PT to increase their independence and safety with mobility to allow discharge to the venue listed below.       Follow Up Recommendations Home health PT    Equipment Recommendations  Rolling walker with 5" wheels    Recommendations for Other Services       Precautions / Restrictions Precautions Precautions: Fall Precaution Comments: very tremulous      Mobility  Bed Mobility Overal bed mobility: Needs Assistance Bed Mobility: Rolling;Sidelying to Sit Rolling: Supervision Sidelying to sit: Supervision       General bed mobility comments: use of bed rail to push up to sitting.    Transfers Overall transfer level: Needs assistance Equipment used: Rolling walker (2 wheeled);1 person hand held assist Transfers: Sit to/from [facility] Sit to Stand: Mod assist Stand pivot transfers: Mod assist       General transfer comment: patient held onto  Therapist's hand, did not want to use RW but did reach for it as he turned to recliner. Very shakey, entire body.  Ambulation/Gait             General Gait Details: tba  Stairs            Wheelchair Mobility    Modified Rankin (Stroke Patients Only)       Balance Overall balance assessment: Needs assistance Sitting-balance support: No upper extremity supported;Feet supported Sitting balance-Leahy Scale: Fair     Standing balance support: During functional activity;Bilateral upper extremity supported Standing balance-Leahy Scale: Poor Standing balance comment: relies on support. patient required steady assist                             Pertinent Vitals/Pain Pain Assessment: No/denies pain    Home Living Family/patient expects to be discharged to:: Private residence Living Arrangements: Spouse/significant other Available Help at Discharge: Family Type of Home: House Home Access: Level entry     Home Layout: One level Home Equipment: None      Prior Function Level of Independence: Independent               Hand Dominance   Dominant Hand: Right    Extremity/Trunk Assessment   Upper Extremity Assessment Upper Extremity Assessment: Defer to OT evaluation (very tremulous, worse with intension.)    Lower Extremity Assessment Lower Extremity Assessment: Generalized weakness (legs tremulous when standing.)    Cervical / Trunk Assessment Cervical / Trunk Assessment: Normal  Communication   Communication: No difficulties  Cognition Arousal/Alertness: Awake/alert Behavior  During Therapy: WFL for tasks assessed/performed;Anxious Overall Cognitive Status: Impaired/Different from baseline Area of Impairment: Orientation                 Orientation Level: Time             General Comments: askning what day it is      General Comments      Exercises General Exercises - Lower Extremity Long Arc Quad: AROM;15  reps;Seated;Both Hip Flexion/Marching: AROM;15 reps;Both;Seated Other Exercises Other Exercises: orange TB for UE's x 10 reps-shoulder flexion, ext and abduction.   Assessment/Plan    PT Assessment Patient needs continued PT services  PT Problem List Decreased strength;Decreased balance;Decreased knowledge of precautions;Decreased cognition;Decreased mobility;Decreased knowledge of use of DME;Decreased activity tolerance       PT Treatment Interventions DME instruction;Therapeutic activities;Gait training;Therapeutic exercise;Patient/family education;Functional mobility training;Balance training    PT Goals (Current goals can be found in the Care Plan section)  Acute Rehab PT Goals Patient Stated Goal: get some sleep PT Goal Formulation: With patient Time For Goal Achievement: 11/04/20 Potential to Achieve Goals: Good    Frequency Min 3X/week   Barriers to discharge        Co-evaluation               AM-PAC PT "6 Clicks" Mobility  Outcome Measure Help needed turning from your back to your side while in a flat bed without using bedrails?: A Little Help needed moving from lying on your back to sitting on the side of a flat bed without using bedrails?: A Little Help needed moving to and from a bed to a chair (including a wheelchair)?: A Lot Help needed standing up from a chair using your arms (e.g., wheelchair or bedside chair)?: A Lot Help needed to walk in hospital room?: A Lot Help needed climbing 3-5 steps with a railing? : Total 6 Click Score: 13    End of Session Equipment Utilized During Treatment: Gait belt Activity Tolerance: Patient tolerated treatment well Patient left: in chair;with call bell/phone within reach;with chair alarm set Nurse Communication: Mobility status PT Visit Diagnosis: Unsteadiness on feet (R26.81);Difficulty in walking, not elsewhere classified (R26.2)    Time: 4098-1191 PT Time Calculation (min) (ACUTE ONLY): 56 min   Charges:    PT Evaluation $PT Eval Low Complexity: 1 Low PT Treatments $Therapeutic Exercise: 8-22 mins $Therapeutic Activity: 8-22 mins $Self Care/Home Management: 8-22        Blanchard Kelch PT Acute Rehabilitation Services Pager [phone] Office [phone]   Rada Hay 10/21/2020, 1:25 PM

## 2020-10-21 NOTE — Plan of Care (Signed)
  Problem: Education: Goal: Knowledge of General Education information will improve Description: Including pain rating scale, medication(s)/side effects and non-pharmacologic comfort measures Outcome: Progressing   Problem: Health Behavior/Discharge Planning: Goal: Ability to manage health-related needs will improve Outcome: Progressing   Problem: Clinical Measurements: Goal: Diagnostic test results will improve Outcome: Progressing Goal: Respiratory complications will improve Outcome: Progressing   Problem: Activity: Goal: Risk for activity intolerance will decrease Outcome: Progressing   Problem: Education: Goal: Knowledge of risk factors and measures for prevention of condition will improve Outcome: Progressing   Problem: Respiratory: Goal: Will maintain a patent airway Outcome: Progressing

## 2020-10-22 DIAGNOSIS — Z8572 Personal history of non-Hodgkin lymphomas: Secondary | ICD-10-CM | POA: Diagnosis not present

## 2020-10-22 DIAGNOSIS — D649 Anemia, unspecified: Secondary | ICD-10-CM | POA: Diagnosis not present

## 2020-10-22 DIAGNOSIS — U071 COVID-19: Secondary | ICD-10-CM | POA: Diagnosis not present

## 2020-10-22 DIAGNOSIS — J9601 Acute respiratory failure with hypoxia: Secondary | ICD-10-CM | POA: Diagnosis not present

## 2020-10-22 LAB — COMPREHENSIVE METABOLIC PANEL
ALT: 90 U/L — ABNORMAL HIGH (ref 0–44)
AST: 79 U/L — ABNORMAL HIGH (ref 15–41)
Albumin: 2.6 g/dL — ABNORMAL LOW (ref 3.5–5.0)
Alkaline Phosphatase: 49 U/L (ref 38–126)
Anion gap: 11 (ref 5–15)
BUN: 16 mg/dL (ref 8–23)
CO2: 25 mmol/L (ref 22–32)
Calcium: 8.4 mg/dL — ABNORMAL LOW (ref 8.9–10.3)
Chloride: 101 mmol/L (ref 98–111)
Creatinine, Ser: 0.87 mg/dL (ref 0.61–1.24)
GFR, Estimated: >60 mL/min (ref >60)
Glucose, Bld: 188 mg/dL — ABNORMAL HIGH (ref 70–99)
Potassium: 4.5 mmol/L (ref 3.5–5.1)
Sodium: 137 mmol/L (ref 135–145)
Total Bilirubin: 0.6 mg/dL (ref 0.3–1.2)
Total Protein: 5.8 g/dL — ABNORMAL LOW (ref 6.5–8.1)

## 2020-10-22 LAB — CBC WITH DIFFERENTIAL/PLATELET
Abs Immature Granulocytes: 0.03 10*3/uL (ref 0.00–0.07)
Basophils Absolute: 0 10*3/uL (ref 0.0–0.1)
Basophils Relative: 0 %
Eosinophils Absolute: 0 10*3/uL (ref 0.0–0.5)
Eosinophils Relative: 0 %
HCT: 43 % (ref 39.0–52.0)
Hemoglobin: 14.3 g/dL (ref 13.0–17.0)
Immature Granulocytes: 1 %
Lymphocytes Relative: 6 %
Lymphs Abs: 0.2 10*3/uL — ABNORMAL LOW (ref 0.7–4.0)
MCH: 31.9 pg (ref 26.0–34.0)
MCHC: 33.3 g/dL (ref 30.0–36.0)
MCV: 96 fL (ref 80.0–100.0)
Monocytes Absolute: 0.3 10*3/uL (ref 0.1–1.0)
Monocytes Relative: 10 %
Neutro Abs: 3 10*3/uL (ref 1.7–7.7)
Neutrophils Relative %: 83 %
Platelets: 241 10*3/uL (ref 150–400)
RBC: 4.48 MIL/uL (ref 4.22–5.81)
RDW: 13.1 % (ref 11.5–15.5)
WBC: 3.5 10*3/uL — ABNORMAL LOW (ref 4.0–10.5)
nRBC: 0 % (ref 0.0–0.2)

## 2020-10-22 LAB — FERRITIN: Ferritin: 1339 ng/mL — ABNORMAL HIGH (ref 24–336)

## 2020-10-22 LAB — C-REACTIVE PROTEIN: CRP: 6.5 mg/dL — ABNORMAL HIGH (ref <1.0)

## 2020-10-22 LAB — D-DIMER, QUANTITATIVE: D-Dimer, Quant: 0.42 ug/mL-FEU (ref 0.00–0.50)

## 2020-10-22 MED ORDER — HYDROCOD POLST-CPM POLST ER 10-8 MG/5ML PO SUER: 5.0000 mL | Freq: Two times a day (BID) | ORAL | Status: DC | PRN

## 2020-10-22 MED ORDER — BENZONATATE 100 MG PO CAPS
200.0000 mg | ORAL_CAPSULE | Freq: Three times a day (TID) | ORAL | Status: DC
  Administered 2020-10-22 – 2020-10-23 (×3): 200 mg via ORAL
  Filled 2020-10-22 (×3): qty 2

## 2020-10-22 MED ORDER — POLYETHYLENE GLYCOL 3350 17 G PO PACK
17.0000 g | PACK | Freq: Two times a day (BID) | ORAL | Status: DC
  Administered 2020-10-22 – 2020-10-23 (×2): 17 g via ORAL
  Filled 2020-10-22 (×2): qty 1

## 2020-10-22 NOTE — Progress Notes (Signed)
Physical Therapy Treatment Patient Details Name: Antonio [name] MRN: [id number] DOB: 01-15-1941 Today's Date: 10/22/2020    History of Present Illness This is an 80 year old male who is vaccinated against COVID-19 with past medical history of left bundle branch block, essential tremor, prior melanoma, right testicular large B-cell lymphoma , hypertension, hyperlipidemia, MGUS who presented to the ED 10/21/19 with cough, chest tightness and shortness of breath, fever.   Tested positive with a home Covid test about 8 days ago.    PT Comments    Pt resting upon PT arrival to room, but agreeable to OOB mobility when asked. Pt ambulatory in hallway today, requiring min assist overall for steadying and activity pacing. Pt satting at 85% on RA during gait, and 86-90% on 2LO2 with cues for breathing technique and rest breaks throughout. PT continuing to follow acutely.    Follow Up Recommendations  Home health PT     Equipment Recommendations  Rolling walker with 5" wheels    Recommendations for Other Services       Precautions / Restrictions Precautions Precautions: Fall Precaution Comments: tremulous, baseline condition Restrictions Weight Bearing Restrictions: No    Mobility  Bed Mobility Overal bed mobility: Needs Assistance Bed Mobility: Supine to Sit     Supine to sit: Min guard     General bed mobility comments: for safety, pt with use of bedrails to pull to sit and increased time.  Transfers Overall transfer level: Needs assistance Equipment used: None Transfers: Sit to/from Stand Sit to Stand: Min guard Stand pivot transfers: Min assist       General transfer comment: Min guard for safety, pt requesting PT not physically assist him. STS x2, from EOB and recliner.  Ambulation/Gait Ambulation/Gait assistance: Min assist Gait Distance (Feet): 100 Feet (20+80) Assistive device: Rolling walker (2 wheeled) Gait Pattern/deviations: Step-through pattern;Decreased stride  length;Trunk flexed Gait velocity: decr   General Gait Details: Min assist to steady, physically guide pt and RW. Pt noticably unsteady in standing, but no overt LOB. SpO2 85% on RA during gait, placed on 2LO2 with SpO2 ranging 86-90% and higher with cues for breathing technique.   Stairs             Wheelchair Mobility    Modified Rankin (Stroke Patients Only)       Balance Overall balance assessment: Needs assistance Sitting-balance support: Feet supported Sitting balance-Leahy Scale: Fair     Standing balance support: During functional activity;Bilateral upper extremity supported Standing balance-Leahy Scale: Poor Standing balance comment: reliant on external assist in standing                            Cognition Arousal/Alertness: Awake/alert Behavior During Therapy: WFL for tasks assessed/performed Overall Cognitive Status: No family/caregiver present to determine baseline cognitive functioning Area of Impairment: Orientation;Attention;Memory;Safety/judgement;Awareness;Problem solving                 Orientation Level: Disoriented to;Time Current Attention Level: Selective Memory: Decreased short-term memory   Safety/Judgement: Decreased awareness of deficits Awareness: Intellectual;Emergent Problem Solving: Requires verbal cues;Requires tactile cues General Comments: Decreased recall of yesterday's PT session, initially does not understand correlation between SpO2 read and need for supplemental O2      Exercises      General Comments General comments (skin integrity, edema, etc.): decreased to 83% on RA with HR 124.  He was able to recover into low 90s with seated rest break.  02 sats remained 90  or higher with activity while pt on 2L supplemental 02      Pertinent Vitals/Pain Pain Assessment: No/denies pain    Home Living Family/patient expects to be discharged to:: Private residence Living Arrangements: Spouse/significant  other Available Help at Discharge: Family Type of Home: Independent living facility Home Access: Level entry   Home Layout: One level Home Equipment: Toilet riser;Grab bars - tub/shower;Grab bars - toilet Additional Comments: Pt lives with wife at [facility]    Prior Function Level of Independence: Independent      Comments: Pt was fully independent.  He travels frequently for duck and quail hunting trips, and enjoys fishing.  Retired Advice worker   PT Goals (current goals can now be found in the care plan section) Acute Rehab PT Goals Patient Stated Goal: get some sleep PT Goal Formulation: With patient Time For Goal Achievement: 11/04/20 Potential to Achieve Goals: Good Progress towards PT goals: Progressing toward goals    Frequency    Min 3X/week      PT Plan Current plan remains appropriate    Co-evaluation              AM-PAC PT "6 Clicks" Mobility   Outcome Measure  Help needed turning from your back to your side while in a flat bed without using bedrails?: A Little Help needed moving from lying on your back to sitting on the side of a flat bed without using bedrails?: A Little Help needed moving to and from a bed to a chair (including a wheelchair)?: A Little Help needed standing up from a chair using your arms (e.g., wheelchair or bedside chair)?: A Little Help needed to walk in hospital room?: A Little Help needed climbing 3-5 steps with a railing? : A Lot 6 Click Score: 17    End of Session   Activity Tolerance: Patient tolerated treatment well Patient left: in chair;with call bell/phone within reach;with chair alarm set Nurse Communication: Mobility status;Other (comment) (O2 requirements) PT Visit Diagnosis: Unsteadiness on feet (R26.81);Difficulty in walking, not elsewhere classified (R26.2)     Time: [id number] PT Time Calculation (min) (ACUTE ONLY): 33 min  Charges:  $Gait Training: 8-22 mins $Therapeutic Activity: 8-22 mins                      Stacie Glaze, PT Acute Rehabilitation Services Pager [phone]  Office [phone]   Roxine Caddy E Ruffin Pyo 10/22/2020, 4:21 PM

## 2020-10-22 NOTE — Progress Notes (Signed)
PROGRESS NOTE                                                                                                                                                                                                             Patient Demographics:    Antonio [name], is a 80 y.o. male, DOB - 12/27/1940, MB:[id number]  Outpatient Primary MD for the patient is [name], DO   Admit date - 10/20/2020   LOS - 2  Chief Complaint  Patient presents with  . Cough  . Shortness of Breath       Brief Narrative: Patient is a 79 y.o. 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  COVID-19 vaccinated status: Vaccinated including booster  Significant Events: 12/29>> Covid positive (per prior documentation) 1/4>> admit to [facility] for hypoxia due to COVID-19 pneumonia  Significant studies: 1/4>>Chest x-ray: Patchy interstitial prominence-likely pneumonia 1/4>> CT chest: Multifocal pneumonia, right hemidiaphragm.  COVID-19 medications: Steroids: 1/4>> Remdesivir: 1/4>>  Antibiotics: Doxycycline: 1/4>> 1/5 Cefepime: 1/4>> x1  Microbiology data: 1/4 >>blood culture: Negative  Procedures: None  Consults: None  DVT prophylaxis: enoxaparin (LOVENOX) injection 40 mg Start: 10/21/20 1600 SCDs Start: 10/20/20 1653    Subjective:   Continues to have coughing spells-but overall better-down to room air this morning-but it requires 2 L of oxygen with ambulation.  No major issues overnight per nursing staff.    Assessment  & Plan :   Acute Hypoxic Resp Failure due to Covid 19 Viral pneumonia:  Improved-on room air this morning-but requires around 2 L of oxygen with ambulation.  CRP downtrending.  Continue supportive care-suspect that if he continues to improve-he should be able to go home in the next 1-2 days-incomplete Remdesivir infusion in the outpatient setting.  Probably will require home O2 on discharge.  Fever: afebrile O2 requirements:  SpO2: (!) 89 % O2 Flow Rate (L/min): 3 L/min   COVID-19 Labs: Recent Labs    10/20/20 1400 10/21/20 0348 10/22/20 0156  DDIMER 0.47 0.55* 0.42  FERRITIN 721* 1,079* 1,339*  LDH 237*  --   --  CRP 7.9* 10.7* 6.5*    No results found for: BNP  Recent Labs  Lab 10/20/20 1400  PROCALCITON <0.10    Lab Results  Component Value Date   [place] NEGATIVE 03/03/2020     Prone/Incentive Spirometry: encouraged  incentive spirometry use 3-4/hour.  Anemia: Uncertain etiology-?  Lab error-s/p 1 unit of PRBC on admission-hemoglobin went from 6.3 on admission - to 15.1 posttransfusion.  Suspect this was a lab error.  No indication of GI bleeding-FOBT negative.  Continue to monitor closely.    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  Essential  tremor: Continue propanolol   ABG: No results found for: PHART, PCO2ART, PO2ART, HCO3, TCO2, ACIDBASEDEF, O2SAT  Vent Settings: N/A  Condition - Stable  Family Communication  :  Spouse-[name]-[phone]-updated over the phone 1/6  Code Status:DNR  Diet :  Diet Order            Diet Heart Room service appropriate? Yes; Fluid consistency: Thin  Diet effective now                  Disposition Plan  :   Status is: Inpatient  Remains inpatient appropriate because:Inpatient level of care appropriate due to severity of illness   Dispo: The patient is from: Home              Anticipated d/c is to: Home              Anticipated d/c date  is: 1-2 day              Patient currently is not medically stable to d/c.    Barriers to discharge: Hypoxia requiring O2 supplementation/complete 5 days of IV Remdesivir  Antimicorbials  :    Anti-infectives (From admission, onward)   Start     Dose/Rate Route Frequency Ordered Stop   10/21/20 1000  remdesivir 100 mg in sodium chloride 0.9 % 100 mL IVPB       "Followed by" Linked Group Details   100 mg 200 mL/hr over 30 Minutes Intravenous Daily 10/20/20 1652 10/25/20 0959   10/21/20 0600  ceFEPIme (MAXIPIME) 2 g in sodium chloride 0.9 % 100 mL IVPB  Status:  Discontinued        2 g 200 mL/hr over 30 Minutes Intravenous Every 8 hours 10/20/20 1946 10/21/20 1010   10/20/20 2200  doxycycline (VIBRA-TABS) tablet 100 mg  Status:  Discontinued        100 mg Oral Every 12 hours 10/20/20 1849 10/21/20 1010   10/20/20 1915  ceFEPIme (MAXIPIME) 2 g in sodium chloride 0.9 % 100 mL IVPB        2 g 200 mL/hr over 30 Minutes Intravenous  Once 10/20/20 1904 10/20/20 2349   10/20/20 1800  remdesivir 200 mg in sodium chloride 0.9% 250 mL IVPB       "Followed by" Linked Group Details   200 mg 580 mL/hr over 30 Minutes Intravenous Once 10/20/20 1652 10/20/20 1811      Inpatient Medications  Scheduled Meds: . Chlorhexidine Gluconate Cloth  6 each Topical Daily  . enoxaparin (LOVENOX) injection  40 mg Subcutaneous Q24H  . losartan  100 mg Oral Daily  . methylPREDNISolone (SOLU-MEDROL) injection  60 mg Intravenous Q12H  . pantoprazole  40 mg Oral Q1200  . simvastatin  40 mg Oral QPM  . sodium chloride flush  10-40 mL  Intracatheter Q12H   Continuous Infusions: . sodium chloride    . remdesivir 100 mg in NS 100 mL 100 mg (10/22/20 0911)   PRN Meds:.acetaminophen, guaiFENesin-dextromethorphan, Ipratropium-Albuterol, propranolol, sodium chloride flush   Time Spent in minutes  25  See all Orders from today for further details   Jeoffrey Massed M.D on 10/22/2020 at 1:45 PM  To page go to  [email or web address] - use universal password  [facility] -  Office  [phone]    Objective:   Vitals:   10/21/20 1959 10/22/20 0000 10/22/20 0408 10/22/20 0802  BP: 117/70 122/74 120/71 106/80  Pulse: 100 89 82 94  Resp: 19 20 20 13   Temp: 98.3 F (36.8 C) (!) 97.3 F (36.3 C) (!) 97.5 F (36.4 C) 97.8 F (36.6 C)  TempSrc: Oral Oral Oral Axillary  SpO2: 92% 90% 92% (!) 89%  Weight:      Height:        Wt Readings from Last 3 Encounters:  10/20/20 87 kg  09/09/20 87.7 kg  08/19/20 87.9 kg     Intake/Output Summary (Last 24 hours) at 10/22/2020 1345 Last data filed at 10/22/2020 0902 Gross per 24 hour  Intake 20 ml  Output 825 ml  Net -805 ml     Physical Exam Gen Exam:Alert awake-not in any distress HEENT:atraumatic, normocephalic Chest: B/L clear to auscultation anteriorly CVS:S1S2 regular Abdomen:soft non tender, non distended Extremities:no edema Neurology: Non focal Skin: no rash   Data Review:    CBC Recent Labs  Lab 10/20/20 1400 10/21/20 0035 10/21/20 0348 10/22/20 0156  WBC 6.6 4.7  --  3.5*  HGB 6.3* 14.1 14.2 14.3  HCT 19.5* 43.9 42.3 43.0  PLT 291 184  --  241  MCV 101.0* 99.1  --  96.0  MCH 32.6 31.8  --  31.9  MCHC 32.3 32.1  --  33.3  RDW 13.3 13.3  --  13.1  LYMPHSABS 0.8 0.2*  --  0.2*  MONOABS 0.8 0.4  --  0.3  EOSABS 0.0 0.0  --  0.0  BASOSABS 0.0 0.0  --  0.0    Chemistries  Recent Labs  Lab 10/20/20 1400 10/21/20 0348 10/22/20 0156  NA 135 136 137  K 4.1 4.6 4.5  CL 98 99 101  CO2 26 27 25   GLUCOSE 100* 150* 188*  BUN 9 7* 16  CREATININE 0.57* 0.68 0.87  CALCIUM 8.6* 8.3* 8.4*  AST 75* 98* 79*  ALT 67* 88* 90*  ALKPHOS 45 56 49  BILITOT 0.4 0.9 0.6   ------------------------------------------------------------------------------------------------------------------ Recent Labs    10/20/20 1400  TRIG 108    Lab Results  Component Value Date   HGBA1C 5.7 02/27/2020    ------------------------------------------------------------------------------------------------------------------ No results for input(s): TSH, T4TOTAL, T3FREE, THYROIDAB in the last 72 hours.  Invalid input(s): FREET3 ------------------------------------------------------------------------------------------------------------------ Recent Labs    10/21/20 0348 10/22/20 0156  FERRITIN 1,079* 1,339*    Coagulation profile No results for input(s): INR, PROTIME in the last 168 hours.  Recent Labs    10/21/20 0348 10/22/20 0156  DDIMER 0.55* 0.42    Cardiac Enzymes No results for input(s): CKMB, TROPONINI, MYOGLOBIN in the last 168 hours.  Invalid input(s): CK ------------------------------------------------------------------------------------------------------------------ No results found for: BNP  Micro Results Recent Results (from the past 240 hour(s))  Blood Culture (routine x 2)     Status: None (Preliminary result)   Collection Time: 10/20/20  2:00 PM   Specimen: BLOOD  Result Value Ref Range Status  Specimen Description   Final    BLOOD PORTA CATH Performed at [facility] [address] [place], [place] [address]    Special Requests   Final    BOTTLES DRAWN AEROBIC AND ANAEROBIC Blood Culture adequate volume Performed at [facility] [address] [place], [place] [address]    Culture   Final    NO GROWTH 2 DAYS Performed at [facility] [address] [place], [place] [address]    Report Status PENDING  Incomplete  Blood Culture (routine x 2)     Status: None (Preliminary result)   Collection Time: 10/20/20  2:00 PM   Specimen: BLOOD  Result Value Ref Range Status   Specimen Description   Final    BLOOD PORTA CATH Performed at [facility] [address], [place], [place] [address]    Special Requests   Final    BOTTLES DRAWN AEROBIC AND ANAEROBIC Blood Culture adequate  volume Performed at [facility] [address], [place], [place] [address]    Culture   Final    NO GROWTH 2 DAYS Performed at [facility] [address] [place], [place] [address]    Report Status PENDING  Incomplete  MRSA PCR Screening     Status: None   Collection Time: 10/21/20 12:58 AM  Result Value Ref Range Status   MRSA by PCR NEGATIVE NEGATIVE Final    Comment:        The GeneXpert MRSA Assay (FDA approved for NASAL specimens only), is one component of a comprehensive MRSA colonization surveillance program. It is not intended to diagnose MRSA infection nor to guide or monitor treatment for MRSA infections. Performed at [facility], [place] [address] [place],  [address]     Radiology Reports CT CHEST WO CONTRAST  Result Date: 10/20/2020 CLINICAL DATA:  80 year old male with abnormal x-ray and pleural effusion. EXAM: CT CHEST WITHOUT CONTRAST TECHNIQUE: Multidetector CT imaging of the chest was performed following the standard protocol without IV contrast. COMPARISON:  Chest radiograph dated 10/20/2020. FINDINGS: Evaluation of this exam is limited in the absence of intravenous contrast. Cardiovascular: There is no cardiomegaly or pericardial effusion. There is coronary vascular calcification of the LAD. Mild atherosclerotic calcification of the thoracic aorta. No aneurysmal dilatation. The central pulmonary arteries are grossly unremarkable. Mediastinum/Nodes: There is no hilar or mediastinal adenopathy. The esophagus is grossly unremarkable. No mediastinal fluid collection. Right-sided Port-A-Cath with tip in central SVC close to the cavoatrial junction. Lungs/Pleura: There is eventration of the right hemidiaphragm. There are bibasilar patchy ground-glass densities, right greater than left as well as additional scattered ground-glass pulmonary opacities in the upper lobes most consistent with multifocal pneumonia, likely viral or atypical  in etiology including COVID-19. Clinical correlation is recommended. There is no pleural effusion pneumothorax. The central airways are patent. Upper Abdomen: A 3 cm right renal upper pole cyst. Musculoskeletal: No chest wall mass or suspicious bone lesions identified. IMPRESSION: Multifocal pneumonia, likely viral or atypical in etiology. Clinical correlation and follow-up to resolution recommended. Electronically Signed   By: Anner Crete M.D.   On: 10/20/2020 19:35   DG Chest Port 1 View  Result Date: 10/20/2020 CLINICAL DATA:  Recent COVID positive, new shortness of breath EXAM: PORTABLE CHEST 1 VIEW COMPARISON:  None. FINDINGS: Right chest wall port catheter tip overlies SVC. Opacification of the right mid and lower lung likely reflects combination of moderate pleural effusion  and atelectasis. Patchy interstitial prominence in the remainder of the right lung as well as the left lung. No pneumothorax. Cardiomediastinal contours are partially obscured but otherwise unremarkable. IMPRESSION: Probable moderate pleural effusion and associated atelectasis. Patchy interstitial prominence in the remainder of the lungs may reflect edema or pneumonia. Electronically Signed   By: Macy Mis M.D.   On: 10/20/2020 14:30

## 2020-10-22 NOTE — Evaluation (Signed)
Occupational Therapy Evaluation Patient Details Name: Antonio [name] MRN: [id number] DOB: 15-Feb-1941 Today's Date: 10/22/2020    History of Present Illness This is an 80 year old male who is vaccinated against COVID-19 with past medical history of left bundle branch block, essential tremor, prior melanoma, right testicular large B-cell lymphoma , hypertension, hyperlipidemia, MGUS who presented to the ED 10/21/19 with cough, chest tightness and shortness of breath, fever.   Tested positive with a home Covid test about 8 days ago.   Clinical Impression   Pt admitted with above. He demonstrates the below listed deficits and will benefit from continued OT to maximize safety and independence with BADLs.  Pt presents to OT with generalized weakness, balance deficits, decreased activity tolerance, as well as mild cognitive deficits.  He currently requires set up assist - min A for ADLs and min guard - min A for functional mobility.   02 sats dropped to 83% on RA with HR 124 during activity. Pt reports he lives with his wife at [facility] and was fully independent PTA.  He is very motivated.  Will continue to follow.       Follow Up Recommendations  Home health OT;Supervision/Assistance - 24 hour    Equipment Recommendations  None recommended by OT    Recommendations for Other Services       Precautions / Restrictions Precautions Precautions: Fall Precaution Comments: tremulous, baseline condition Restrictions Weight Bearing Restrictions: No      Mobility Bed Mobility Overal bed mobility: Needs Assistance Bed Mobility: Supine to Sit     Supine to sit: Min guard     General bed mobility comments: for safety, pt with use of bedrails to pull to sit and increased time.    Transfers Overall transfer level: Needs assistance Equipment used: None Transfers: Sit to/from Stand Sit to Stand: Min guard Stand pivot transfers: Min assist       General transfer comment: Min guard for  safety, pt requesting PT not physically assist him. STS x2, from EOB and recliner.    Balance Overall balance assessment: Needs assistance Sitting-balance support: Feet supported Sitting balance-Leahy Scale: Fair     Standing balance support: During functional activity;Bilateral upper extremity supported Standing balance-Leahy Scale: Poor Standing balance comment: reliant on external assist in standing                           ADL either performed or assessed with clinical judgement   ADL Overall ADL's : Needs assistance/impaired Eating/Feeding: Independent   Grooming: Wash/dry hands;Wash/dry face;Oral care;Brushing hair;Min guard;Standing   Upper Body Bathing: Set up;Sitting   Lower Body Bathing: Sit to/from stand;Minimal assistance   Upper Body Dressing : Set up;Sitting   Lower Body Dressing: Sit to/from stand;Minimal assistance Lower Body Dressing Details (indicate cue type and reason): 02 sats dropped to 86% on RA while donning socks Toilet Transfer: Ambulation;Comfort height toilet;RW;Grab bars;Minimal assistance   Toileting- Clothing Manipulation and Hygiene: Sit to/from stand;Minimal assistance       Functional mobility during ADLs: Rolling walker;Minimal assistance General ADL Comments: Pt requires assist for balance and safety and requires rest breaks during ADLs.  Discussed recommendation to use shower seat to sit during shower to conserve energy. Also discussed taking temperate showers instead of hot showers to conserve energy.     Vision Baseline Vision/History: No visual deficits Patient Visual Report: No change from baseline       Perception     Praxis  Pertinent Vitals/Pain Pain Assessment: No/denies pain     Hand Dominance Right   Extremity/Trunk Assessment Upper Extremity Assessment Upper Extremity Assessment: RUE deficits/detail;LUE deficits/detail;Generalized weakness RUE Deficits / Details: bil. UEs tremulous.  Pt reports  this is worse than his baseline RUE Coordination: decreased gross motor;decreased fine motor LUE Deficits / Details: bil. UEs tremulous.  Pt reports this is worse than his baseline LUE Coordination: decreased gross motor;decreased fine motor   Lower Extremity Assessment Lower Extremity Assessment: Defer to PT evaluation   Cervical / Trunk Assessment Cervical / Trunk Assessment: Normal   Communication Communication Communication: No difficulties   Cognition Arousal/Alertness: Awake/alert Behavior During Therapy: WFL for tasks assessed/performed Overall Cognitive Status: No family/caregiver present to determine baseline cognitive functioning Area of Impairment: Orientation;Attention;Memory;Safety/judgement;Awareness;Problem solving                 Orientation Level: Disoriented to;Time Current Attention Level: Selective Memory: Decreased short-term memory   Safety/Judgement: Decreased awareness of deficits Awareness: Intellectual;Emergent Problem Solving: Requires verbal cues;Requires tactile cues General Comments: Decreased recall of yesterday's PT session, initially does not understand correlation between SpO2 read and need for supplemental O2   General Comments  decreased to 83% on RA with HR 124.  He was able to recover into low 90s with seated rest break.  02 sats remained 90 or higher with activity while pt on 2L supplemental 02    Exercises     Shoulder Instructions      Home Living Family/patient expects to be discharged to:: Private residence Living Arrangements: Spouse/significant other Available Help at Discharge: Family Type of Home: Independent living facility Home Access: Level entry     Home Layout: One level     Bathroom Shower/Tub: Occupational psychologist: Handicapped height     Home Equipment: Toilet riser;Grab bars - tub/shower;Grab bars - toilet   Additional Comments: Pt lives with wife at [facility]      Prior  Functioning/Environment Level of Independence: Independent        Comments: Pt was fully independent.  He travels frequently for duck and quail hunting trips, and enjoys fishing.  Retired Advice worker        OT Problem List: Decreased strength;Decreased activity tolerance;Impaired balance (sitting and/or standing);Decreased cognition;Decreased coordination;Decreased safety awareness;Decreased knowledge of use of DME or AE;Cardiopulmonary status limiting activity;Impaired UE functional use      OT Treatment/Interventions: Self-care/ADL training;Therapeutic exercise;DME and/or AE instruction;Energy conservation;Therapeutic activities;Cognitive remediation/compensation;Patient/family education;Balance training    OT Goals(Current goals can be found in the care plan section) Acute Rehab OT Goals Patient Stated Goal: get some sleep OT Goal Formulation: With patient Time For Goal Achievement: 11/05/20 Potential to Achieve Goals: Good ADL Goals Pt Will Perform Grooming: with supervision;standing Pt Will Perform Upper Body Bathing: with set-up;sitting Pt Will Perform Lower Body Bathing: with supervision;sit to/from stand Pt Will Perform Upper Body Dressing: with set-up;sitting Pt Will Perform Lower Body Dressing: with supervision;sit to/from stand Pt Will Transfer to Toilet: with supervision;ambulating;regular height toilet;bedside commode;grab bars Pt Will Perform Toileting - Clothing Manipulation and hygiene: with supervision;sit to/from stand Pt/caregiver will Perform Home Exercise Program: Increased strength;Right Upper extremity;Left upper extremity;With Supervision;With written HEP provided;With theraband Additional ADL Goal #1: Pt will independently incorporate energy conservation techniques during ADL Additional ADL Goal #2: Pt will demonstrate anticipatory awareness of deficits with no more than min cues  OT Frequency: Min 2X/week   Barriers to D/C:  Co-evaluation               AM-PAC OT "6 Clicks" Daily Activity     Outcome Measure Help from another person eating meals?: None Help from another person taking care of personal grooming?: A Little Help from another person toileting, which includes using toliet, bedpan, or urinal?: A Little Help from another person bathing (including washing, rinsing, drying)?: A Little Help from another person to put on and taking off regular upper body clothing?: A Little Help from another person to put on and taking off regular lower body clothing?: A Little 6 Click Score: 19   End of Session Equipment Utilized During Treatment: Rolling walker;Gait belt;Oxygen Nurse Communication: Mobility status  Activity Tolerance: Patient tolerated treatment well Patient left: in chair;with call bell/phone within reach;with chair alarm set  OT Visit Diagnosis: Unsteadiness on feet (R26.81);Cognitive communication deficit (R41.841)                Time: 1106-1200 OT Time Calculation (min): 54 min Charges:  OT General Charges $OT Visit: 1 Visit OT Evaluation $OT Eval Moderate Complexity: 1 Mod OT Treatments $Self Care/Home Management : 8-22 mins $Therapeutic Activity: 23-37 mins  Nilsa Nutting., OTR/L Acute Rehabilitation Services Pager [phone] Office [place], [place] 10/22/2020, 4:21 PM

## 2020-10-22 NOTE — Care Management (Signed)
Patient Information  Patient Name  Antonio [name], Antonio [name] Legal Sex  Male DOB  08-29-1941 SSN  [id number]    Order Information  Order Date/Time Release Date/Time Start Date/Time End Date/Time  10/22/20 01:45 PM None 10/22/20 01:45 PM Until Specified   Order History Inpatient Date/Time Action Taken User Additional Information  10/22/20 1345 Sign Maretta Bees, MD   10/22/20 1345 Release Instance Maretta Bees, MD (auto-released) Released Order: [id number]  10/22/20 1349 Acknowledge Jon Gills, RN New Order   Order Questions  Question Answer  To provide the following care/treatments PT   OT

## 2020-10-22 NOTE — TOC Initial Note (Addendum)
Transition of Care [facility]) - Initial/Assessment Note    Patient Details  Name: Antonio [name] MRN: [id number] Date of Birth: Mar 13, 1941  [facility] of Care [facility] CM/SW Contact:    Lockie Pares, RN Phone Number: 10/22/2020, 3:57 PM  Clinical Narrative:                 Patient with COVID, Will need home oxygen  Ordered through adapt.  Patient also requires home health will call patient for choices, not currently active with any agency per [facility], called patients room phone, patient could not hear me, will call spouse for plan. Spoke to paitent wife nad daughter who state that they wish to go through [facility] for PT and OT if they have it, if not they would like a agency that works well with His insurance. Discussed oxygen, and DME< they have a walker and a BSC, they do not think they will need anything further. rMade an ointment with primary MD.. [facility] will provide PT and OT, faxed order over to them.  [phone]  Expected Discharge Plan: Home w Home Health Services Barriers to Discharge: Continued Medical Work up   Patient Goals and CMS Choice        Expected Discharge Plan and Services Expected Discharge Plan: Home w Home Health Services   Discharge Planning Services: CM Consult   Living arrangements for the past 2 months: Single Family Home                 DME Arranged: Oxygen   Date DME Agency Contacted: 10/22/20 Time DME Agency Contacted: [id number] Representative spoke with at DME Agency: Cat            Prior Living Arrangements/Services Living arrangements for the past 2 months: Single Family Home Lives with:: Spouse Patient language and need for interpreter reviewed:: Yes        Need for Family Participation in Patient Care: No (Comment) Care giver support system in place?: No (comment)   Criminal Activity/Legal Involvement Pertinent to Current Situation/Hospitalization: No - Comment as needed  Activities of Daily Living Home Assistive  Devices/Equipment: Hearing aid,Eyeglasses (2 hearing aides) ADL Screening (condition at time of admission) Patient's cognitive ability adequate to safely complete daily activities?: Yes Is the patient deaf or have difficulty hearing?: Yes Does the patient have difficulty seeing, even when wearing glasses/contacts?: No Does the patient have difficulty concentrating, remembering, or making decisions?: Yes Patient able to express need for assistance with ADLs?: Yes Does the patient have difficulty dressing or bathing?: No Independently performs ADLs?: No Communication: Independent Dressing (OT): Independent Grooming: Independent Feeding: Independent Bathing: Independent Toileting: Needs assistance Is this a change from baseline?: Change from baseline, expected to last >3days In/Out Bed: Needs assistance Is this a change from baseline?: Change from baseline, expected to last >3 days Walks in Home: Needs assistance Is this a change from baseline?: Change from baseline, expected to last >3 days Does the patient have difficulty walking or climbing stairs?: Yes Weakness of Legs: Both Weakness of Arms/Hands: Both  Permission Sought/Granted                  Emotional Assessment       Orientation: : Oriented to Self,Oriented to Place Alcohol / Substance Use: Not Applicable    Admission diagnosis:  Hypoxia [R09.02] Symptomatic anemia [D64.9] COVID-19 [U07.1] Patient Active Problem List   Diagnosis Date Noted  . Symptomatic anemia 10/20/2020  . Acute respiratory failure with hypoxia (HCC) 10/20/2020  . Pleural  effusion 10/20/2020  . Testosterone deficiency 09/09/2020  . S/P orchiectomy 09/09/2020  . Diffuse large B cell lymphoma ([place]) 03/17/2020  . 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 Counseling regarding advance care planning and goals of care 03/17/2020  . Hyperglycemia 09/05/2018  . Mass of soft tissue of left lower extremity 09/05/2018  . Monoclonal  gammopathy of unknown significance (MGUS) 09/05/2018  . Macrocytosis without anemia 09/05/2018  . 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 Slow transit constipation 06/21/2017  . Sleep-wake disorder 06/21/2017  . Benign essential HTN 06/21/2017  . Pure hypercholesterolemia 06/21/2017  . History of B-cell lymphoma 06/21/2017  . Personal history of malignant melanoma 06/21/2017  . Left carotid artery stenosis 06/21/2017  . DDD (degenerative disc disease), cervical 06/21/2017  . History of kidney stones 06/21/2017  . Tubular adenoma of colon 06/21/2017  . 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  . Vitamin D deficiency 06/21/2017   PCP:  [name], DO Pharmacy:   [id number] - [place], [place] [address] Phone: [phone] Fax: [place], [place] [place] [address] Phone: [phone] Fax: [place], [place] [address] [place] [address] Phone: [phone] Fax: [phone]     Social Determinants of Health (SDOH) Interventions    Readmission Risk Interventions No flowsheet data found.

## 2020-10-22 NOTE — Progress Notes (Signed)
SATURATION QUALIFICATIONS: (This note is used to comply with regulatory documentation for home oxygen)  Patient Saturations on Room Air at Rest = 90  %  Patient Saturations on Room Air while Ambulating = 83%  Patient Saturations on 2 Liters of oxygen while Ambulating = 90%  Please briefly explain why patient needs home oxygen: Pt requires use of 02 to allow him to maintain 02 saturations >90% to safely complete ADLs and functional mobility.  Eber Jones., OTR/L Acute Rehabilitation Services Pager [phone] Office [phone]

## 2020-10-23 DIAGNOSIS — U071 COVID-19: Secondary | ICD-10-CM | POA: Diagnosis not present

## 2020-10-23 DIAGNOSIS — J9601 Acute respiratory failure with hypoxia: Secondary | ICD-10-CM | POA: Diagnosis not present

## 2020-10-23 DIAGNOSIS — I10 Essential (primary) hypertension: Secondary | ICD-10-CM | POA: Diagnosis not present

## 2020-10-23 DIAGNOSIS — Z8572 Personal history of non-Hodgkin lymphomas: Secondary | ICD-10-CM | POA: Diagnosis not present

## 2020-10-23 LAB — CBC WITH DIFFERENTIAL/PLATELET
Abs Immature Granulocytes: 0.05 10*3/uL (ref 0.00–0.07)
Basophils Absolute: 0 10*3/uL (ref 0.0–0.1)
Basophils Relative: 0 %
Eosinophils Absolute: 0 10*3/uL (ref 0.0–0.5)
Eosinophils Relative: 0 %
HCT: 39.9 % (ref 39.0–52.0)
Hemoglobin: 13.7 g/dL (ref 13.0–17.0)
Immature Granulocytes: 1 %
Lymphocytes Relative: 4 %
Lymphs Abs: 0.3 10*3/uL — ABNORMAL LOW (ref 0.7–4.0)
MCH: 33.3 pg (ref 26.0–34.0)
MCHC: 34.3 g/dL (ref 30.0–36.0)
MCV: 97.1 fL (ref 80.0–100.0)
Monocytes Absolute: 0.5 10*3/uL (ref 0.1–1.0)
Monocytes Relative: 6 %
Neutro Abs: 6.6 10*3/uL (ref 1.7–7.7)
Neutrophils Relative %: 89 %
Platelets: 296 10*3/uL (ref 150–400)
RBC: 4.11 MIL/uL — ABNORMAL LOW (ref 4.22–5.81)
RDW: 13.1 % (ref 11.5–15.5)
WBC: 7.5 10*3/uL (ref 4.0–10.5)
nRBC: 0 % (ref 0.0–0.2)

## 2020-10-23 LAB — COMPREHENSIVE METABOLIC PANEL
ALT: 78 U/L — ABNORMAL HIGH (ref 0–44)
AST: 52 U/L — ABNORMAL HIGH (ref 15–41)
Albumin: 2.8 g/dL — ABNORMAL LOW (ref 3.5–5.0)
Alkaline Phosphatase: 49 U/L (ref 38–126)
Anion gap: 11 (ref 5–15)
BUN: 24 mg/dL — ABNORMAL HIGH (ref 8–23)
CO2: 26 mmol/L (ref 22–32)
Calcium: 8.4 mg/dL — ABNORMAL LOW (ref 8.9–10.3)
Chloride: 100 mmol/L (ref 98–111)
Creatinine, Ser: 0.83 mg/dL (ref 0.61–1.24)
GFR, Estimated: >60 mL/min (ref >60)
Glucose, Bld: 192 mg/dL — ABNORMAL HIGH (ref 70–99)
Potassium: 4 mmol/L (ref 3.5–5.1)
Sodium: 137 mmol/L (ref 135–145)
Total Bilirubin: 0.8 mg/dL (ref 0.3–1.2)
Total Protein: 5.7 g/dL — ABNORMAL LOW (ref 6.5–8.1)

## 2020-10-23 LAB — D-DIMER, QUANTITATIVE: D-Dimer, Quant: 0.3 ug/mL-FEU (ref 0.00–0.50)

## 2020-10-23 LAB — C-REACTIVE PROTEIN: CRP: 2.2 mg/dL — ABNORMAL HIGH (ref <1.0)

## 2020-10-23 LAB — FERRITIN: Ferritin: 890 ng/mL — ABNORMAL HIGH (ref 24–336)

## 2020-10-23 MED ORDER — PREDNISONE 10 MG PO TABS: ORAL_TABLET | ORAL | 0 refills | Status: DC

## 2020-10-23 MED ORDER — HEPARIN SOD (PORK) LOCK FLUSH 100 UNIT/ML IV SOLN
500.0000 [IU] | INTRAVENOUS | Status: AC | PRN
  Administered 2020-10-23: 500 [IU]
  Filled 2020-10-23: qty 5

## 2020-10-23 NOTE — Progress Notes (Addendum)
Patient scheduled for outpatient Remdesivir infusion at 2:30pm on Saturday 1/8 at [facility]. Please inform the patient to park at [place], as staff will be escorting the patient through the [place] entrance of the hospital. Appointments take approximately 45 minutes.    There is a wave flag banner located near the entrance on [address]. Turn into this entrance and immediately turn left or right and park in 1 of the 10 designated Covid Infusion Parking spots. There is a phone number on the sign, please call and let the staff know what spot you are in and we will come out and get you. For questions call [phone].  Thanks.

## 2020-10-23 NOTE — Progress Notes (Signed)
Occupational Therapy Treatment Patient Details Name: Antonio [name] MRN: [id number] DOB: Mar 17, 1941 Today's Date: 10/23/2020    History of present illness This is an 80 year old male who is vaccinated against COVID-19 with past medical history of left bundle branch block, essential tremor, prior melanoma, right testicular large B-cell lymphoma , hypertension, hyperlipidemia, MGUS who presented to the ED 10/21/19 with cough, chest tightness and shortness of breath, fever.   Tested positive with a home Covid test about 8 days ago.   OT comments  Pt significantly improved this date.  He is less tremulous, and balance is improved.  He was able to perform ADLs and functional mobility with min guard assist.  02 sats remained 88-94% on RA during activity.  Reviewed energy conservation strategies with him.  He reports his wife and daughter will assist him as needed at discharge.   Follow Up Recommendations  Home health OT;Supervision/Assistance - 24 hour    Equipment Recommendations  None recommended by OT    Recommendations for Other Services      Precautions / Restrictions Precautions Precautions: Fall Precaution Comments: tremulous, baseline condition       Mobility Bed Mobility               General bed mobility comments: pt sitting up in chair  Transfers Overall transfer level: Needs assistance Equipment used: Rolling walker (2 wheeled) Transfers: Sit to/from [employer] Sit to Stand: Min guard Stand pivot transfers: Min guard       General transfer comment: min guard for safety    Balance Overall balance assessment: Needs assistance Sitting-balance support: Feet supported Sitting balance-Leahy Scale: Good     Standing balance support: During functional activity;No upper extremity supported Standing balance-Leahy Scale: Fair                             ADL either performed or assessed with clinical judgement   ADL Overall ADL's : Needs  assistance/impaired     Grooming: Wash/dry hands;Wash/dry face;Brushing hair;Supervision/safety;Standing                   Toilet Transfer: Min guard;Ambulation;Comfort height toilet;Grab bars;RW   Toileting- Water quality scientist and Hygiene: Min guard;Sit to/from stand       Functional mobility during ADLs: Min guard;Rolling walker General ADL Comments: reinforced recommendation to sit to shower     Vision       Perception     Praxis      Cognition Arousal/Alertness: Awake/alert Behavior During Therapy: WFL for tasks assessed/performed Overall Cognitive Status: No family/caregiver present to determine baseline cognitive functioning                                 General Comments: min cues for problem solving and awareness        Exercises Exercises: Other exercises Other Exercises Other Exercises: reviewed energy conservation strategies, and safety at home.  Pt voices concern about bed mobility since he has high beds at home.  offered to raise the height of his hospital bed and simulate it here, but he declined.  Discussed log rolling and pushing up from sidelying as opposed to supine to sit.  He reports his wife and daughter will assist hime   Shoulder Instructions       General Comments 02 sats remained 88-94% on RA with activity this date.  DOE 2/4  Pertinent Vitals/ Pain       Pain Assessment: No/denies pain  Home Living                                          Prior Functioning/Environment              Frequency  Min 2X/week        Progress Toward Goals  OT Goals(current goals can now be found in the care plan section)  Progress towards OT goals: Progressing toward goals     Plan Discharge plan remains appropriate    Co-evaluation                 AM-PAC OT "6 Clicks" Daily Activity     Outcome Measure   Help from another person eating meals?: None Help from another person taking care  of personal grooming?: A Little Help from another person toileting, which includes using toliet, bedpan, or urinal?: A Little Help from another person bathing (including washing, rinsing, drying)?: A Little Help from another person to put on and taking off regular upper body clothing?: A Little Help from another person to put on and taking off regular lower body clothing?: A Little 6 Click Score: 19    End of Session Equipment Utilized During Treatment: Rolling walker  OT Visit Diagnosis: Unsteadiness on feet (R26.81);Cognitive communication deficit (R41.841)   Activity Tolerance Patient tolerated treatment well   Patient Left in chair;with call bell/phone within reach;with chair alarm set   Nurse Communication Mobility status        Time: 5465-0354 OT Time Calculation (min): 30 min  Charges: OT General Charges $OT Visit: 1 Visit OT Treatments $Self Care/Home Management : 23-37 mins  Nilsa Nutting OTR/L Acute Rehabilitation Services Pager [phone] Office [phone]    Lucille Passy M 10/23/2020, 1:44 PM

## 2020-10-23 NOTE — Discharge Summary (Signed)
PATIENT DETAILS Name: Antonio [name] Age: 80 y.o. Sex: male Date of Birth: Feb 16, 1941 MRN: [id number]. Admitting Physician: Harold Hedge, MD [id number], Rexene Edison, DO  Admit Date: 10/20/2020 Discharge date: 10/23/2020  Recommendations for Outpatient Follow-up:  1. Follow up with PCP in 1-2 weeks 2. Please obtain CMP/CBC in one week 3. Repeat Chest Xray in 4-6 week  Admitted From:  Home  Disposition: Home with home health [place]: Yes  Equipment/Devices: None  Discharge Condition: Stable  CODE STATUS: FULL CODE  Diet recommendation:  Diet Order            Diet - low sodium heart healthy           Diet Heart Room service appropriate? Yes; Fluid consistency: Thin  Diet effective now                  Brief Narrative: Patient is a 80 y.o. 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  COVID-19 vaccinated status: Vaccinated including booster  Significant Events: 12/29>> Covid positive (per prior documentation) 1/4>> admit to [facility] for hypoxia due to COVID-19 pneumonia  Significant studies: 1/4>>Chest x-ray: Patchy interstitial prominence-likely pneumonia 1/4>> CT chest: Multifocal pneumonia, right hemidiaphragm.  COVID-19 medications: Steroids: 1/4>> Remdesivir: 1/4>>  Antibiotics: Doxycycline: 1/4>> 1/5 Cefepime: 1/4>> x1  Microbiology data: 1/4 >>blood culture: Negative  Procedures: None  Consults: None  Brief Hospital Course: Acute Hypoxic Resp Failure due to Covid 19 Viral pneumonia:  Significantly improved-had mild hypoxemia-he is down to room air for the past 2 days-but does require  O2 with ambulation.  CRP downtrending.  Stable for discharge today-we will finish his last dose of Remdesivir in the infusion center.  Continue tapering steroids.  He is aware that if in the unlikely event that he has worsening shortness of breath-he needs to seek immediate medical attention.   COVID-19 Labs:  Recent Labs    10/20/20 1400 10/21/20 0348 10/22/20 0156 10/23/20 0350  DDIMER 0.47 0.55* 0.42 0.30  FERRITIN 721* 1,079* 1,339* 890*  LDH 237*  --   --   --   CRP 7.9* 10.7* 6.5* 2.2*    Lab Results  Component Value Date   SARSCOV2NAA NEGATIVE [id number]     Anemia: Uncertain etiology-?  Lab error-s/p 1 unit of PRBC on admission-hemoglobin went from 6.3 on admission - to 15.1 posttransfusion.  Suspect this was a lab error.  No indication of GI bleeding-FOBT negative.  Continue to monitor closely.    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  Essential  tremor: Continue propanolol   Discharge Diagnoses:  Principal Problem:   Acute respiratory failure with hypoxia (HCC) Active Problems:   Benign essential HTN   History of B-cell lymphoma   Symptomatic anemia   Pleural effusion   Discharge Instructions:    Person Under Monitoring Name: GURSHAWN PICKET  Location: [address] [place] [facility] [address]   Infection Prevention Recommendations for Individuals Confirmed to have, or Being Evaluated for, 2019 Novel Coronavirus (COVID-19) Infection Who Receive Care at Home  Individuals who are confirmed to have,  or are being evaluated for, COVID-19 should follow the prevention steps below until a healthcare provider or local or state health department says they can return to normal activities.  Stay home except to get medical care You should restrict activities outside your home, except for getting medical care. Do not go  to work, school, or public areas, and do not use public transportation or taxis.  Call ahead before visiting your doctor Before your medical appointment, call the healthcare provider and tell them that you have, or are being evaluated for, COVID-19 infection. This will help the healthcare provider's office take steps to keep other people from getting infected. Ask your healthcare provider to call the local or state health department.  Monitor your symptoms Seek prompt medical attention if your illness is worsening (e.g., difficulty breathing). Before going to your medical appointment, call the healthcare provider and tell them that you have, or are being evaluated for, COVID-19 infection. Ask your healthcare provider to call the local or state health department.  Wear a facemask You should wear a facemask that covers your nose and mouth when you are in the same room with other people and when you visit a healthcare provider. People who live with or visit you should also wear a facemask while they are in the same room with you.  Separate yourself from other people in your home As much as possible, you should stay in a different room from other people in your home. Also, you should use a separate bathroom, if available.  Avoid sharing household items You should not share dishes, drinking glasses, cups, eating utensils, towels, bedding, or other items with other people in your home. After using these items, you should wash them thoroughly with soap and water.  Cover your coughs and sneezes Cover your mouth and nose with a tissue when you cough or sneeze, or you can cough or sneeze into your sleeve. Throw used tissues in a lined trash can, and immediately wash your hands with soap and water for at least 20 seconds or use an alcohol-based hand rub.  Wash your [facility] your hands often and thoroughly with soap and water for at least 20 seconds. You can use an alcohol-based hand sanitizer  if soap and water are not available and if your hands are not visibly dirty. Avoid touching your eyes, nose, and mouth with unwashed hands.   Prevention Steps for Caregivers and Household Members of Individuals Confirmed to have, or Being Evaluated for, COVID-19 Infection Being Cared for in the Home  If you live with, or provide care at home for, a person confirmed to have, or being evaluated for, COVID-19 infection please follow these guidelines to prevent infection:  Follow healthcare provider's instructions Make sure that you understand and can help the patient follow any healthcare provider instructions for all care.  Provide for the patient's basic needs You should help the patient with basic needs in the home and provide support for getting groceries, prescriptions, and other personal needs.  Monitor the patient's symptoms If they are getting sicker, call his or her medical provider and tell them that the patient has, or is being evaluated for, COVID-19 infection. This will help the healthcare provider's office take steps to keep other people from getting infected. Ask the healthcare provider to call the local or state health department.  Limit the number of people who have contact with the patient  If possible, have only one caregiver for the patient.  Other household members  should stay in another home or place of residence. If this is not possible, they should stay  in another room, or be separated from the patient as much as possible. Use a separate bathroom, if available.  Restrict visitors who do not have an essential need to be in the home.  Keep older adults, very young children, and other sick people away from the patient Keep older adults, very young children, and those who have compromised immune systems or chronic health conditions away from the patient. This includes people with chronic heart, lung, or kidney conditions, diabetes, and cancer.  Ensure good  ventilation Make sure that shared spaces in the home have good air flow, such as from an air conditioner or an opened window, weather permitting.  Wash your hands often  Wash your hands often and thoroughly with soap and water for at least 20 seconds. You can use an alcohol based hand sanitizer if soap and water are not available and if your hands are not visibly dirty.  Avoid touching your eyes, nose, and mouth with unwashed hands.  Use disposable paper towels to dry your hands. If not available, use dedicated cloth towels and replace them when they become wet.  Wear a facemask and gloves  Wear a disposable facemask at all times in the room and gloves when you touch or have contact with the patient's blood, body fluids, and/or secretions or excretions, such as sweat, saliva, sputum, nasal mucus, vomit, urine, or feces.  Ensure the mask fits over your nose and mouth tightly, and do not touch it during use.  Throw out disposable facemasks and gloves after using them. Do not reuse.  Wash your hands immediately after removing your facemask and gloves.  If your personal clothing becomes contaminated, carefully remove clothing and launder. Wash your hands after handling contaminated clothing.  Place all used disposable facemasks, gloves, and other waste in a lined container before disposing them with other household waste.  Remove gloves and wash your hands immediately after handling these items.  Do not share dishes, glasses, or other household items with the patient  Avoid sharing household items. You should not share dishes, drinking glasses, cups, eating utensils, towels, bedding, or other items with a patient who is confirmed to have, or being evaluated for, COVID-19 infection.  After the person uses these items, you should wash them thoroughly with soap and water.  Wash laundry thoroughly  Immediately remove and wash clothes or bedding that have blood, body fluids, and/or  secretions or excretions, such as sweat, saliva, sputum, nasal mucus, vomit, urine, or feces, on them.  Wear gloves when handling laundry from the patient.  Read and follow directions on labels of laundry or clothing items and detergent. In general, wash and dry with the warmest temperatures recommended on the label.  Clean all areas the individual has used often  Clean all touchable surfaces, such as counters, tabletops, doorknobs, bathroom fixtures, toilets, phones, keyboards, tablets, and bedside tables, every day. Also, clean any surfaces that may have blood, body fluids, and/or secretions or excretions on them.  Wear gloves when cleaning surfaces the patient has come in contact with.  Use a diluted bleach solution (e.g., dilute bleach with 1 part bleach and 10 parts water) or a household disinfectant with a label that says EPA-registered for coronaviruses. To make a bleach solution at home, add 1 tablespoon of bleach to 1 quart (4 cups) of water. For a larger supply, add  cup of bleach to 1  gallon (16 cups) of water.  Read labels of cleaning products and follow recommendations provided on product labels. Labels contain instructions for safe and effective use of the cleaning product including precautions you should take when applying the product, such as wearing gloves or eye protection and making sure you have good ventilation during use of the product.  Remove gloves and wash hands immediately after cleaning.  Monitor yourself for signs and symptoms of illness Caregivers and household members are considered close contacts, should monitor their health, and will be asked to limit movement outside of the home to the extent possible. Follow the monitoring steps for close contacts listed on the symptom monitoring form.   ? If you have additional questions, contact your local health department or call the epidemiologist on call at [phone] (available 24/7). ? This guidance is subject  to change. For the most up-to-date guidance from CDC, please refer to their website: YouBlogs.pl    Activity:  As tolerated with Full fall precautions use walker/cane & assistance as needed Discharge Instructions    Call MD for:  difficulty breathing, headache or visual disturbances   Complete by: As directed    Diet - low sodium heart healthy   Complete by: As directed    Discharge instructions   Complete by: As directed    1.)  21 days of isolation from the office first positive test.  2.)  If you develop worsening shortness of breath-please seek immediate medical attention.   Increase activity slowly   Complete by: As directed      Allergies as of 10/23/2020      Reactions   Latex Itching   Latex tape causes itching and redness.      Medication List    TAKE these medications   albuterol 108 (90 Base) MCG/ACT inhaler Commonly known as: VENTOLIN HFA Inhale 2 puffs into the lungs every 6 (six) hours as needed for shortness of breath.   ascorbic acid 500 MG tablet Commonly known as: VITAMIN C Take 1 tablet (500 mg total) by mouth daily.   B-12 PO Take 1 tablet by mouth daily.   benzonatate 100 MG capsule Commonly known as: TESSALON Take 1 capsule (100 mg total) by mouth 3 (three) times daily as needed for cough.   chlorpheniramine-HYDROcodone 10-8 MG/5ML Suer Commonly known as: Tussionex Pennkinetic ER Take 5 mLs by mouth at bedtime as needed for cough.   Cholecalciferol 100 MCG (4000 UT) Caps Take 1 capsule (4,000 Units total) by mouth daily.   diphenhydramine-acetaminophen 25-500 MG Tabs tablet Commonly known as: TYLENOL PM Take 1 tablet by mouth at bedtime as needed (sleep).   losartan 100 MG tablet Commonly known as: COZAAR Take 1 tablet (100 mg total) by mouth daily.   meloxicam 7.5 MG tablet Commonly known as: MOBIC TAKE 1 TABLET (7.5 MG TOTAL) BY MOUTH 2 (TWO) TIMES DAILY AS NEEDED FOR  PAIN.   predniSONE 10 MG tablet Commonly known as: DELTASONE Take 40 mg daily for 1 day, 30 mg daily for 1 day, 20 mg daily for 1 days,10 mg daily for 1 day, then stop   PRESERVISION AREDS 2 PO Take 1 capsule by mouth 2 (two) times daily.   propranolol 10 MG tablet Commonly known as: INDERAL 1 tablet up to three times a day for tremor   REFRESH EYE ITCH RELIEF OP Place 1 drop into both eyes daily as needed (itchy eyes).   simvastatin 40 MG tablet Commonly known as: ZOCOR TAKE 1 TABLET  BY MOUTH EVERY DAY   Testosterone 20.25 MG/ACT (1.62%) Gel Apply 40.5 mg topically daily.   valACYclovir 1000 MG tablet Commonly known as: Valtrex Take one tablet by mouth twice daily for 3 days as needed for flares.   zinc gluconate 50 MG tablet Take 2 tablets (100 mg total) by mouth daily.            Durable Medical Equipment  (From admission, onward)         Start     Ordered   10/23/20 0649  For home use only DME Walker rolling  Once       Question Answer Comment  Walker: With [employer] Wheels   Patient needs a walker to treat with the following condition Physical deconditioning      10/23/20 0648   10/22/20 1345  For home use only DME oxygen  Once       Question Answer Comment  Length of Need 6 Months   Mode or (Route) Nasal cannula   Liters per Minute 2   Frequency Continuous (stationary and portable oxygen unit needed)   Oxygen conserving device Yes   Oxygen delivery system Gas      10/22/20 1344          Follow-up Information    Reed, Tiffany L, DO. Go on 10/29/2020.   Specialty: Geriatric Medicine Why: 9:00 AM- follow up from hospitalization Contact information: [address]. [place] [address] [phone]        [facility] Follow up.   Why: Order faxed over to [employer] information: will provide pT and OT- order faxed              Allergies  Allergen Reactions  . Latex Itching    Latex tape causes itching and redness.      Other Procedures/Studies: CT CHEST WO CONTRAST  Result Date: 10/20/2020 CLINICAL DATA:  80 year old male with abnormal x-ray and pleural effusion. EXAM: CT CHEST WITHOUT CONTRAST TECHNIQUE: Multidetector CT imaging of the chest was performed following the standard protocol without IV contrast. COMPARISON:  Chest radiograph dated 10/20/2020. FINDINGS: Evaluation of this exam is limited in the absence of intravenous contrast. Cardiovascular: There is no cardiomegaly or pericardial effusion. There is coronary vascular calcification of the LAD. Mild atherosclerotic calcification of the thoracic aorta. No aneurysmal dilatation. The central pulmonary arteries are grossly unremarkable. Mediastinum/Nodes: There is no hilar or mediastinal adenopathy. The esophagus is grossly unremarkable. No mediastinal fluid collection. Right-sided Port-A-Cath with tip in central SVC close to the cavoatrial junction. Lungs/Pleura: There is eventration of the right hemidiaphragm. There are bibasilar patchy ground-glass densities, right greater than left as well as additional scattered ground-glass pulmonary opacities in the upper lobes most consistent with multifocal pneumonia, likely viral or atypical in etiology including COVID-19. Clinical correlation is recommended. There is no pleural effusion pneumothorax. The central airways are patent. Upper Abdomen: A 3 cm right renal upper pole cyst. Musculoskeletal: No chest wall mass or suspicious bone lesions identified. IMPRESSION: Multifocal pneumonia, likely viral or atypical in etiology. Clinical correlation and follow-up to resolution recommended. Electronically Signed   By: Anner Crete M.D.   On: 10/20/2020 19:35   DG Chest Port 1 View  Result Date: 10/20/2020 CLINICAL DATA:  Recent COVID positive, new shortness of breath EXAM: PORTABLE CHEST 1 VIEW COMPARISON:  None. FINDINGS: Right chest wall port catheter tip overlies SVC. Opacification of the right mid and lower lung  likely reflects combination of moderate pleural effusion and atelectasis.  Patchy interstitial prominence in the remainder of the right lung as well as the left lung. No pneumothorax. Cardiomediastinal contours are partially obscured but otherwise unremarkable. IMPRESSION: Probable moderate pleural effusion and associated atelectasis. Patchy interstitial prominence in the remainder of the lungs may reflect edema or pneumonia. Electronically Signed   By: Macy Mis M.D.   On: 10/20/2020 14:30     TODAY-DAY OF DISCHARGE:  Subjective:   [name] today has no headache,no chest abdominal pain,no new weakness tingling or numbness, feels much better wants to go home today.   Objective:   Blood pressure 114/63, pulse 87, temperature 98.8 F (37.1 C), temperature source Oral, resp. rate 20, height 6' (1.829 m), weight 87 kg, SpO2 (!) 88 %.  Intake/Output Summary (Last 24 hours) at 10/23/2020 1033 Last data filed at 10/23/2020 0847 Gross per 24 hour  Intake 250 ml  Output 850 ml  Net -600 ml   Filed Weights   10/20/20 2203  Weight: 87 kg    Exam: Awake Alert, Oriented *3, No new F.N deficits, Normal affect [place].AT,PERRAL Supple Neck,No JVD, No cervical lymphadenopathy appriciated.  Symmetrical Chest wall movement, Good air movement bilaterally, CTAB RRR,No Gallops,Rubs or new Murmurs, No Parasternal Heave +ve B.Sounds, Abd Soft, Non tender, No organomegaly appriciated, No rebound -guarding or rigidity. No Cyanosis, Clubbing or edema, No new Rash or bruise   PERTINENT RADIOLOGIC STUDIES: CT CHEST WO CONTRAST  Result Date: 10/20/2020 CLINICAL DATA:  80 year old male with abnormal x-ray and pleural effusion. EXAM: CT CHEST WITHOUT CONTRAST TECHNIQUE: Multidetector CT imaging of the chest was performed following the standard protocol without IV contrast. COMPARISON:  Chest radiograph dated 10/20/2020. FINDINGS: Evaluation of this exam is limited in the absence of intravenous contrast.  Cardiovascular: There is no cardiomegaly or pericardial effusion. There is coronary vascular calcification of the LAD. Mild atherosclerotic calcification of the thoracic aorta. No aneurysmal dilatation. The central pulmonary arteries are grossly unremarkable. Mediastinum/Nodes: There is no hilar or mediastinal adenopathy. The esophagus is grossly unremarkable. No mediastinal fluid collection. Right-sided Port-A-Cath with tip in central SVC close to the cavoatrial junction. Lungs/Pleura: There is eventration of the right hemidiaphragm. There are bibasilar patchy ground-glass densities, right greater than left as well as additional scattered ground-glass pulmonary opacities in the upper lobes most consistent with multifocal pneumonia, likely viral or atypical in etiology including COVID-19. Clinical correlation is recommended. There is no pleural effusion pneumothorax. The central airways are patent. Upper Abdomen: A 3 cm right renal upper pole cyst. Musculoskeletal: No chest wall mass or suspicious bone lesions identified. IMPRESSION: Multifocal pneumonia, likely viral or atypical in etiology. Clinical correlation and follow-up to resolution recommended. Electronically Signed   By: Anner Crete M.D.   On: 10/20/2020 19:35   DG Chest Port 1 View  Result Date: 10/20/2020 CLINICAL DATA:  Recent COVID positive, new shortness of breath EXAM: PORTABLE CHEST 1 VIEW COMPARISON:  None. FINDINGS: Right chest wall port catheter tip overlies SVC. Opacification of the right mid and lower lung likely reflects combination of moderate pleural effusion and atelectasis. Patchy interstitial prominence in the remainder of the right lung as well as the left lung. No pneumothorax. Cardiomediastinal contours are partially obscured but otherwise unremarkable. IMPRESSION: Probable moderate pleural effusion and associated atelectasis. Patchy interstitial prominence in the remainder of the lungs may reflect edema or pneumonia.  Electronically Signed   By: Macy Mis M.D.   On: 10/20/2020 14:30     PERTINENT LAB RESULTS: CBC: Recent Labs  10/22/20 0156 10/23/20 0350  WBC 3.5* 7.5  HGB 14.3 13.7  HCT 43.0 39.9  PLT 241 296   CMET CMP     Component Value Date/Time   NA 137 10/23/2020 0350   NA 141 03/01/2018 0600   K 4.0 10/23/2020 0350   CL 100 10/23/2020 0350   CO2 26 10/23/2020 0350   GLUCOSE 192 (H) 10/23/2020 0350   BUN 24 (H) 10/23/2020 0350   BUN 17 03/01/2018 0600   CREATININE 0.83 10/23/2020 0350   CREATININE 0.86 08/19/2020 0854   CALCIUM 8.4 (L) 10/23/2020 0350   PROT 5.7 (L) 10/23/2020 0350   ALBUMIN 2.8 (L) 10/23/2020 0350   AST 52 (H) 10/23/2020 0350   AST 20 08/19/2020 0854   ALT 78 (H) 10/23/2020 0350   ALT 27 08/19/2020 0854   ALKPHOS 49 10/23/2020 0350   BILITOT 0.8 10/23/2020 0350   BILITOT 0.7 08/19/2020 0854   GFRNONAA >60 10/23/2020 0350   GFRNONAA >60 08/19/2020 0854   GFRAA >60 07/08/2020 1015    GFR Estimated Creatinine Clearance: 76.6 mL/min (by C-G formula based on SCr of 0.83 mg/dL). No results for input(s): LIPASE, AMYLASE in the last 72 hours. No results for input(s): CKTOTAL, CKMB, CKMBINDEX, TROPONINI in the last 72 hours. Invalid input(s): [place]    10/22/20 0156 10/23/20 0350  DDIMER 0.42 0.30   No results for input(s): HGBA1C in the last 72 hours. Recent Labs    10/20/20 1400  TRIG 108   No results for input(s): TSH, T4TOTAL, T3FREE, THYROIDAB in the last 72 hours.  Invalid input(s): FREET3 Recent Labs    10/22/20 0156 10/23/20 0350  FERRITIN 1,339* 890*   Coags: No results for input(s): INR in the last 72 hours.  Invalid input(s): PT Microbiology: Recent Results (from the past 240 hour(s))  Blood Culture (routine x 2)     Status: None (Preliminary result)   Collection Time: 10/20/20  2:00 PM   Specimen: BLOOD  Result Value Ref Range Status   Specimen Description   Final    BLOOD PORTA CATH Performed at [facility] [address] [place], [place] [address]    Special Requests   Final    BOTTLES DRAWN AEROBIC AND ANAEROBIC Blood Culture adequate volume Performed at [facility] [address] [place], [place] [address]    Culture   Final    NO GROWTH 2 DAYS Performed at [facility] [address], [place], [place] [address]    Report Status PENDING  Incomplete  Blood Culture (routine x 2)     Status: None (Preliminary result)   Collection Time: 10/20/20  2:00 PM   Specimen: BLOOD  Result Value Ref Range Status   Specimen Description   Final    BLOOD PORTA CATH Performed at [facility] [address], [place], [place] [address]    Special Requests   Final    BOTTLES DRAWN AEROBIC AND ANAEROBIC Blood Culture adequate volume Performed at [facility] [address] [place], [place] [address]    Culture   Final    NO GROWTH 2 DAYS Performed at [facility] [address] [place],  [address]    Report Status PENDING  Incomplete  MRSA PCR Screening     Status: None   Collection Time: 10/21/20 12:58 AM  Result Value Ref Range Status   MRSA by PCR NEGATIVE NEGATIVE Final  Comment:        The GeneXpert MRSA Assay (FDA approved for NASAL specimens only), is one component of a comprehensive MRSA colonization surveillance program. It is not intended to diagnose MRSA infection nor to guide or monitor treatment for MRSA infections. Performed at [facility], [place] [address], [place], [place] [address]     FURTHER DISCHARGE INSTRUCTIONS:  Get Medicines reviewed and adjusted: Please take all your medications with you for your next visit with your Primary MD  Laboratory/radiological data: Please request your Primary MD to go over all hospital tests and procedure/radiological results at the follow up, please ask your Primary MD to get all Hospital records sent to his/her  office.  In some cases, they will be blood work, cultures and biopsy results pending at the time of your discharge. Please request that your primary care M.D. goes through all the records of your hospital data and follows up on these results.  Also Note the following: If you experience worsening of your admission symptoms, develop shortness of breath, life threatening emergency, suicidal or homicidal thoughts you must seek medical attention immediately by calling 911 or calling your MD immediately  if symptoms less severe.  You must read complete instructions/literature along with all the possible adverse reactions/side effects for all the Medicines you take and that have been prescribed to you. Take any new Medicines after you have completely understood and accpet all the possible adverse reactions/side effects.   Do not drive when taking Pain medications or sleeping medications (Benzodaizepines)  Do not take more than prescribed Pain, Sleep and Anxiety Medications. It is not advisable to combine anxiety,sleep and pain medications without talking with your primary care practitioner  Special Instructions: If you have smoked or chewed Tobacco  in the last 2 yrs please stop smoking, stop any regular Alcohol  and or any Recreational drug use.  Wear Seat belts while driving.  Please note: You were cared for by a hospitalist during your hospital stay. Once you are discharged, your primary care physician will handle any further medical issues. Please note that NO REFILLS for any discharge medications will be authorized once you are discharged, as it is imperative that you return to your primary care physician (or establish a relationship with a primary care physician if you do not have one) for your post hospital discharge needs so that they can reassess your need for medications and monitor your lab values.  Total Time spent coordinating discharge including counseling, education and face to face time  equals 35 minutes.  SignedOren Binet 10/23/2020 10:33 AM

## 2020-10-23 NOTE — Discharge Instructions (Addendum)
Person Under Monitoring Name: Antonio [name]  Location: [address] [place] [address]   Infection Prevention Recommendations for Individuals Confirmed to have, or Being Evaluated for, 2019 Novel Coronavirus (COVID-19) Infection Who Receive Care at Home  Individuals who are confirmed to have, or are being evaluated for, COVID-19 should follow the prevention steps below until a healthcare provider or local or state health department says they can return to normal activities.  Stay home except to get medical care You should restrict activities outside your home, except for getting medical care. Do not go to work, school, or public areas, and do not use public transportation or taxis.  Call ahead before visiting your doctor Before your medical appointment, call the healthcare provider and tell them that you have, or are being evaluated for, COVID-19 infection. This will help the healthcare provider's office take steps to keep other people from getting infected. Ask your healthcare provider to call the local or state health department.  Monitor your symptoms Seek prompt medical attention if your illness is worsening (e.g., difficulty breathing). Before going to your medical appointment, call the healthcare provider and tell them that you have, or are being evaluated for, COVID-19 infection. Ask your healthcare provider to call the local or state health department.  Wear a facemask You should wear a facemask that covers your nose and mouth when you are in the same room with other people and when you visit a healthcare provider. People who live with or visit you should also wear a facemask while they are in the same room with you.  Separate yourself from other people in your home As much as possible, you should stay in a different room from other people in your home. Also, you should use a separate bathroom, if available.  Avoid sharing household items You should  not share dishes, drinking glasses, cups, eating utensils, towels, bedding, or other items with other people in your home. After using these items, you should wash them thoroughly with soap and water.  Cover your coughs and sneezes Cover your mouth and nose with a tissue when you cough or sneeze, or you can cough or sneeze into your sleeve. Throw used tissues in a lined trash can, and immediately wash your hands with soap and water for at least 20 seconds or use an alcohol-based hand rub.  Wash your [facility] your hands often and thoroughly with soap and water for at least 20 seconds. You can use an alcohol-based hand sanitizer if soap and water are not available and if your hands are not visibly dirty. Avoid touching your eyes, nose, and mouth with unwashed hands.   Prevention Steps for Caregivers and Household Members of Individuals Confirmed to have, or Being Evaluated for, COVID-19 Infection Being Cared for in the Home  If you live with, or provide care at home for, a person confirmed to have, or being evaluated for, COVID-19 infection please follow these guidelines to prevent infection:  Follow healthcare provider's instructions Make sure that you understand and can help the patient follow any healthcare provider instructions for all care.  Provide for the patient's basic needs You should help the patient with basic needs in the home and provide support for getting groceries, prescriptions, and other personal needs.  Monitor the patient's symptoms If they are getting sicker, call his or her medical provider and tell them that the patient has, or is being evaluated for, COVID-19 infection. This will help the healthcare provider's  office take steps to keep other people from getting infected. Ask the healthcare provider to call the local or state health department.  Limit the number of people who have contact with the patient  If possible, have only one caregiver for the  patient.  Other household members should stay in another home or place of residence. If this is not possible, they should stay  in another room, or be separated from the patient as much as possible. Use a separate bathroom, if available.  Restrict visitors who do not have an essential need to be in the home.  Keep older adults, very young children, and other sick people away from the patient Keep older adults, very young children, and those who have compromised immune systems or chronic health conditions away from the patient. This includes people with chronic heart, lung, or kidney conditions, diabetes, and cancer.  Ensure good ventilation Make sure that shared spaces in the home have good air flow, such as from an air conditioner or an opened window, weather permitting.  Wash your hands often  Wash your hands often and thoroughly with soap and water for at least 20 seconds. You can use an alcohol based hand sanitizer if soap and water are not available and if your hands are not visibly dirty.  Avoid touching your eyes, nose, and mouth with unwashed hands.  Use disposable paper towels to dry your hands. If not available, use dedicated cloth towels and replace them when they become wet.  Wear a facemask and gloves  Wear a disposable facemask at all times in the room and gloves when you touch or have contact with the patient's blood, body fluids, and/or secretions or excretions, such as sweat, saliva, sputum, nasal mucus, vomit, urine, or feces.  Ensure the mask fits over your nose and mouth tightly, and do not touch it during use.  Throw out disposable facemasks and gloves after using them. Do not reuse.  Wash your hands immediately after removing your facemask and gloves.  If your personal clothing becomes contaminated, carefully remove clothing and launder. Wash your hands after handling contaminated clothing.  Place all used disposable facemasks, gloves, and other waste in a lined  container before disposing them with other household waste.  Remove gloves and wash your hands immediately after handling these items.  Do not share dishes, glasses, or other household items with the patient  Avoid sharing household items. You should not share dishes, drinking glasses, cups, eating utensils, towels, bedding, or other items with a patient who is confirmed to have, or being evaluated for, COVID-19 infection.  After the person uses these items, you should wash them thoroughly with soap and water.  Wash laundry thoroughly  Immediately remove and wash clothes or bedding that have blood, body fluids, and/or secretions or excretions, such as sweat, saliva, sputum, nasal mucus, vomit, urine, or feces, on them.  Wear gloves when handling laundry from the patient.  Read and follow directions on labels of laundry or clothing items and detergent. In general, wash and dry with the warmest temperatures recommended on the label.  Clean all areas the individual has used often  Clean all touchable surfaces, such as counters, tabletops, doorknobs, bathroom fixtures, toilets, phones, keyboards, tablets, and bedside tables, every day. Also, clean any surfaces that may have blood, body fluids, and/or secretions or excretions on them.  Wear gloves when cleaning surfaces the patient has come in contact with.  Use a diluted bleach solution (e.g., dilute bleach with 1  part bleach and 10 parts water) or a household disinfectant with a label that says EPA-registered for coronaviruses. To make a bleach solution at home, add 1 tablespoon of bleach to 1 quart (4 cups) of water. For a larger supply, add  cup of bleach to 1 gallon (16 cups) of water.  Read labels of cleaning products and follow recommendations provided on product labels. Labels contain instructions for safe and effective use of the cleaning product including precautions you should take when applying the product, such as wearing gloves or  eye protection and making sure you have good ventilation during use of the product.  Remove gloves and wash hands immediately after cleaning.  Monitor yourself for signs and symptoms of illness Caregivers and household members are considered close contacts, should monitor their health, and will be asked to limit movement outside of the home to the extent possible. Follow the monitoring steps for close contacts listed on the symptom monitoring form.   ? If you have additional questions, contact your local health department or call the epidemiologist on call at [phone] (available 24/7). ? This guidance is subject to change. For the most up-to-date guidance from [facility], please refer to their website: YouBlogs.pl        You are scheduled for an outpatient Remdesivir infusion at 2:30pm on Saturday 1/8 at [facility]. Please park at Post, as staff will be escorting you through the [place] entrance of the hospital. Appointments take approximately 45 minutes.    The address for the infusion clinic site is:  --GPS address is [place] - the parking is located near [employer] building where you will see  COVID19 Infusion feather banner marking the entrance to parking.   (see photos below)            --Enter into the 2nd entrance where the "wave, flag banner" is at the road. Turn into this 2nd entrance and immediately turn left to park in 1 of the 5 parking spots.   --Please stay in your car and call the desk for assistance inside [phone].   The day of your visit you should: Marland Kitchen Get plenty of rest the night before and drink plenty of water . Eat a light meal/snack before coming and take your medications as prescribed  . Wear warm, comfortable clothes with a shirt that can roll-up over the elbow (will need IV start).  . Wear a mask  . Consider bringing some activity to help pass the  time

## 2020-10-23 NOTE — TOC Transition Note (Signed)
Transition of Care [facility]) - CM/SW Discharge Note   Patient Details  Name: Antonio [name] MRN: [id number] Date of Birth: 05-22-1941  [facility] CM/SW Contact:  Verdell Carmine, RN Phone Number: 10/23/2020, 10:51 AM   Clinical Narrative:     Wife informed of appointment made, adapt oxygen was at the home setting up, she was also informed that [facility] will be doing the PT and OT at home.    Barriers to Discharge: Continued Medical Work up   Patient Goals and CMS Choice        Discharge Placement                       Discharge Plan and Services   Discharge Planning Services: CM Consult            DME Arranged: Oxygen   Date DME Agency Contacted: 10/22/20 Time DME Agency Contacted: [id number] Representative spoke with at DME Agency: Cat            Social Determinants of Health ([place]) Interventions     Readmission Risk Interventions No flowsheet data found.

## 2020-10-23 NOTE — Care Management Important Message (Signed)
Important Message  Patient Details  Name: Antonio [name] MRN: [id number] Date of Birth: 20-Nov-1940   Medicare Important Message Given:  Yes - Important Message mailed due to current National Emergency   Verbal consent obtained due to current National Emergency  Relationship to patient: Self Contact Name: Jovan Colligan Call Date: 10/23/20  Time: 1435 Phone: [phone] Outcome: No Answer/Busy Important Message mailed to: Patient address on file    [name] 10/23/2020, 2:35 PM

## 2020-10-23 NOTE — Progress Notes (Addendum)
Antonio [name] D/C'd home per MD order. D/C instructions discussed with the patient and wife all questions fully answered. An After Visit Summary was printed and given to the patient. Patient escorted via [place], and D/C home via private auto. Portable O2 and concentrator O2 sent with patient. Personal belongings sent with patient.  [name] J Adrienne Delay  10/23/2020 4:30 PM

## 2020-10-24 ENCOUNTER — Ambulatory Visit (HOSPITAL_COMMUNITY)
Admit: 2020-10-24 | Discharge: 2020-10-24 | Disposition: A | Discharge status: Home / Self Care | Payer: PPO | Source: Ambulatory Visit | Attending: Pulmonary Disease | Admitting: Pulmonary Disease

## 2020-10-24 DIAGNOSIS — H353130 Nonexudative age-related macular degeneration, bilateral, stage unspecified: Secondary | ICD-10-CM | POA: Diagnosis not present

## 2020-10-24 DIAGNOSIS — J9621 Acute and chronic respiratory failure with hypoxia: Secondary | ICD-10-CM | POA: Diagnosis not present

## 2020-10-24 DIAGNOSIS — Z923 Personal history of irradiation: Secondary | ICD-10-CM | POA: Diagnosis not present

## 2020-10-24 DIAGNOSIS — J9601 Acute respiratory failure with hypoxia: Secondary | ICD-10-CM | POA: Diagnosis not present

## 2020-10-24 DIAGNOSIS — R0902 Hypoxemia: Secondary | ICD-10-CM | POA: Diagnosis not present

## 2020-10-24 DIAGNOSIS — R0602 Shortness of breath: Secondary | ICD-10-CM | POA: Diagnosis not present

## 2020-10-24 DIAGNOSIS — U071 COVID-19: Secondary | ICD-10-CM | POA: Insufficient documentation

## 2020-10-24 DIAGNOSIS — I447 Left bundle-branch block, unspecified: Secondary | ICD-10-CM | POA: Diagnosis not present

## 2020-10-24 DIAGNOSIS — Z96651 Presence of right artificial knee joint: Secondary | ICD-10-CM | POA: Diagnosis not present

## 2020-10-24 DIAGNOSIS — Z961 Presence of intraocular lens: Secondary | ICD-10-CM | POA: Diagnosis not present

## 2020-10-24 DIAGNOSIS — R52 Pain, unspecified: Secondary | ICD-10-CM | POA: Diagnosis not present

## 2020-10-24 DIAGNOSIS — J1282 Pneumonia due to coronavirus disease 2019: Secondary | ICD-10-CM | POA: Diagnosis not present

## 2020-10-24 DIAGNOSIS — J80 Acute respiratory distress syndrome: Secondary | ICD-10-CM | POA: Diagnosis not present

## 2020-10-24 DIAGNOSIS — Z9221 Personal history of antineoplastic chemotherapy: Secondary | ICD-10-CM | POA: Diagnosis not present

## 2020-10-24 DIAGNOSIS — J181 Lobar pneumonia, unspecified organism: Secondary | ICD-10-CM | POA: Diagnosis not present

## 2020-10-24 DIAGNOSIS — Z9079 Acquired absence of other genital organ(s): Secondary | ICD-10-CM | POA: Diagnosis not present

## 2020-10-24 DIAGNOSIS — K5900 Constipation, unspecified: Secondary | ICD-10-CM | POA: Diagnosis not present

## 2020-10-24 DIAGNOSIS — I110 Hypertensive heart disease with heart failure: Secondary | ICD-10-CM | POA: Diagnosis not present

## 2020-10-24 DIAGNOSIS — I471 Supraventricular tachycardia: Secondary | ICD-10-CM | POA: Diagnosis not present

## 2020-10-24 DIAGNOSIS — Z87442 Personal history of urinary calculi: Secondary | ICD-10-CM | POA: Diagnosis not present

## 2020-10-24 DIAGNOSIS — D649 Anemia, unspecified: Secondary | ICD-10-CM | POA: Diagnosis not present

## 2020-10-24 DIAGNOSIS — Z85820 Personal history of malignant melanoma of skin: Secondary | ICD-10-CM | POA: Diagnosis not present

## 2020-10-24 DIAGNOSIS — Z66 Do not resuscitate: Secondary | ICD-10-CM | POA: Diagnosis not present

## 2020-10-24 DIAGNOSIS — C8339 Diffuse large B-cell lymphoma, extranodal and solid organ sites: Secondary | ICD-10-CM | POA: Diagnosis not present

## 2020-10-24 DIAGNOSIS — A419 Sepsis, unspecified organism: Secondary | ICD-10-CM | POA: Diagnosis not present

## 2020-10-24 DIAGNOSIS — J9811 Atelectasis: Secondary | ICD-10-CM | POA: Diagnosis not present

## 2020-10-24 DIAGNOSIS — R0600 Dyspnea, unspecified: Secondary | ICD-10-CM | POA: Diagnosis not present

## 2020-10-24 DIAGNOSIS — T380X5A Adverse effect of glucocorticoids and synthetic analogues, initial encounter: Secondary | ICD-10-CM | POA: Diagnosis not present

## 2020-10-24 DIAGNOSIS — Z9841 Cataract extraction status, right eye: Secondary | ICD-10-CM | POA: Diagnosis not present

## 2020-10-24 DIAGNOSIS — I5032 Chronic diastolic (congestive) heart failure: Secondary | ICD-10-CM | POA: Diagnosis not present

## 2020-10-24 DIAGNOSIS — Z8249 Family history of ischemic heart disease and other diseases of the circulatory system: Secondary | ICD-10-CM | POA: Diagnosis not present

## 2020-10-24 DIAGNOSIS — Z85828 Personal history of other malignant neoplasm of skin: Secondary | ICD-10-CM | POA: Diagnosis not present

## 2020-10-24 DIAGNOSIS — G250 Essential tremor: Secondary | ICD-10-CM | POA: Diagnosis not present

## 2020-10-24 DIAGNOSIS — E782 Mixed hyperlipidemia: Secondary | ICD-10-CM | POA: Diagnosis not present

## 2020-10-24 DIAGNOSIS — R000 Tachycardia, unspecified: Secondary | ICD-10-CM | POA: Diagnosis not present

## 2020-10-24 DIAGNOSIS — G9341 Metabolic encephalopathy: Secondary | ICD-10-CM | POA: Diagnosis not present

## 2020-10-24 DIAGNOSIS — I10 Essential (primary) hypertension: Secondary | ICD-10-CM | POA: Diagnosis not present

## 2020-10-24 DIAGNOSIS — J189 Pneumonia, unspecified organism: Secondary | ICD-10-CM | POA: Diagnosis not present

## 2020-10-24 DIAGNOSIS — N529 Male erectile dysfunction, unspecified: Secondary | ICD-10-CM | POA: Diagnosis not present

## 2020-10-24 LAB — TYPE AND SCREEN
ABO/RH(D): A POS
Antibody Screen: NEGATIVE
Unit division: 0
Unit division: 0

## 2020-10-24 LAB — BPAM RBC
Blood Product Expiration Date: 202201162359
Blood Product Expiration Date: 202201242359
ISSUE DATE / TIME: 202201042025
Unit Type and Rh: 600
Unit Type and Rh: 6200

## 2020-10-24 MED ORDER — SODIUM CHLORIDE 0.9 % IV SOLN
100.0000 mg | Freq: Once | INTRAVENOUS | Status: AC
  Administered 2020-10-24: 100 mg via INTRAVENOUS

## 2020-10-24 MED ORDER — ALBUTEROL SULFATE HFA 108 (90 BASE) MCG/ACT IN AERS: 2.0000 | INHALATION_SPRAY | Freq: Once | RESPIRATORY_TRACT | Status: DC | PRN

## 2020-10-24 MED ORDER — SODIUM CHLORIDE 0.9 % IV SOLN: 100.0000 mg | Freq: Once | INTRAVENOUS | Status: DC

## 2020-10-24 MED ORDER — DIPHENHYDRAMINE HCL 50 MG/ML IJ SOLN: 50.0000 mg | Freq: Once | INTRAMUSCULAR | Status: DC | PRN

## 2020-10-24 MED ORDER — HEPARIN SOD (PORK) LOCK FLUSH 100 UNIT/ML IV SOLN
500.0000 [IU] | Freq: Once | INTRAVENOUS | Status: AC
  Administered 2020-10-24: 500 [IU] via INTRAVENOUS

## 2020-10-24 MED ORDER — EPINEPHRINE 0.3 MG/0.3ML IJ SOAJ: 0.3000 mg | Freq: Once | INTRAMUSCULAR | Status: DC | PRN

## 2020-10-24 MED ORDER — METHYLPREDNISOLONE SODIUM SUCC 125 MG IJ SOLR: 125.0000 mg | Freq: Once | INTRAMUSCULAR | Status: DC | PRN

## 2020-10-24 MED ORDER — SODIUM CHLORIDE 0.9 % IV SOLN: INTRAVENOUS | Status: DC | PRN

## 2020-10-24 MED ORDER — FAMOTIDINE IN NACL 20-0.9 MG/50ML-% IV SOLN: 20.0000 mg | Freq: Once | INTRAVENOUS | Status: DC | PRN

## 2020-10-24 NOTE — Discharge Instructions (Signed)
10 Things You Can Do to Manage Your COVID-19 Symptoms at Home If you have possible or confirmed COVID-19: 1. Stay home from work and school. And stay away from other public places. If you must go out, avoid using any kind of public transportation, ridesharing, or taxis. 2. Monitor your symptoms carefully. If your symptoms get worse, call your healthcare provider immediately. 3. Get rest and stay hydrated. 4. If you have a medical appointment, call the healthcare provider ahead of time and tell them that you have or may have COVID-19. 5. For medical emergencies, call 911 and notify the dispatch personnel that you have or may have COVID-19. 6. Cover your cough and sneezes with a tissue or use the inside of your elbow. 7. Wash your hands often with soap and water for at least 20 seconds or clean your hands with an alcohol-based hand sanitizer that contains at least 60% alcohol. 8. As much as possible, stay in a specific room and away from other people in your home. Also, you should use a separate bathroom, if available. If you need to be around other people in or outside of the home, wear a mask. 9. Avoid sharing personal items with other people in your household, like dishes, towels, and bedding. 10. Clean all surfaces that are touched often, like counters, tabletops, and doorknobs. Use household cleaning sprays or wipes according to the label instructions. cdc.gov/coronavirus 04/17/2019 This information is not intended to replace advice given to you by your health care provider. Make sure you discuss any questions you have with your health care provider. Document Revised: 09/19/2019 Document Reviewed: 09/19/2019 Elsevier Patient Education  2020 Elsevier [employer].  If you have any questions or concerns after the infusion please call the Advanced Practice Provider on call at [phone]. This number is ONLY intended for your use regarding questions or concerns about the infusion post-treatment  side-effects.  Please do not provide this number to others for use. For return to work notes please contact your primary care provider.   If someone you know is interested in receiving treatment please have them call the COVID hotline at [phone].    

## 2020-10-24 NOTE — Progress Notes (Signed)
  Diagnosis: COVID-19  Physician: Dr. Asencion Noble  Procedure: [facility]fusion [facility] Med: remdesivir infusion - Provided patient with remdesivir fact sheet for patients, parents and caregivers prior to infusion.  Complications: No immediate complications noted.  Discharge: Discharged home   Antonio [name] 10/24/2020

## 2020-10-24 NOTE — Progress Notes (Signed)
Patient reviewed Fact Sheet for Patients, Parents, and Caregivers for Emergency Use Authorization (EUA) of remdesivir for the Treatment of Coronavirus. Patient also reviewed and is agreeable to the estimated cost of treatment. Patient is agreeable to proceed.    

## 2020-10-25 LAB — CULTURE, BLOOD (ROUTINE X 2)
Culture: NO GROWTH
Culture: NO GROWTH
Special Requests: ADEQUATE
Special Requests: ADEQUATE

## 2020-10-26 ENCOUNTER — Inpatient Hospital Stay (HOSPITAL_COMMUNITY)
Admission: EM | Admit: 2020-10-26 | Discharge: 2020-11-11 | DRG: 177 | Disposition: A | Discharge status: Skilled Nursing Facility | Payer: PPO | Attending: Internal Medicine | Admitting: Internal Medicine

## 2020-10-26 ENCOUNTER — Emergency Department (HOSPITAL_COMMUNITY): Payer: PPO

## 2020-10-26 ENCOUNTER — Encounter (HOSPITAL_COMMUNITY): Payer: Self-pay

## 2020-10-26 ENCOUNTER — Other Ambulatory Visit: Payer: Self-pay

## 2020-10-26 DIAGNOSIS — Z9221 Personal history of antineoplastic chemotherapy: Secondary | ICD-10-CM

## 2020-10-26 DIAGNOSIS — Z8249 Family history of ischemic heart disease and other diseases of the circulatory system: Secondary | ICD-10-CM | POA: Diagnosis not present

## 2020-10-26 DIAGNOSIS — N529 Male erectile dysfunction, unspecified: Secondary | ICD-10-CM | POA: Diagnosis present

## 2020-10-26 DIAGNOSIS — K5900 Constipation, unspecified: Secondary | ICD-10-CM | POA: Diagnosis present

## 2020-10-26 DIAGNOSIS — A419 Sepsis, unspecified organism: Secondary | ICD-10-CM

## 2020-10-26 DIAGNOSIS — C8339 Diffuse large B-cell lymphoma, extranodal and solid organ sites: Secondary | ICD-10-CM | POA: Diagnosis not present

## 2020-10-26 DIAGNOSIS — C8330 Diffuse large B-cell lymphoma, unspecified site: Secondary | ICD-10-CM | POA: Diagnosis present

## 2020-10-26 DIAGNOSIS — J181 Lobar pneumonia, unspecified organism: Secondary | ICD-10-CM | POA: Diagnosis not present

## 2020-10-26 DIAGNOSIS — U071 COVID-19: Principal | ICD-10-CM | POA: Diagnosis present

## 2020-10-26 DIAGNOSIS — I5032 Chronic diastolic (congestive) heart failure: Secondary | ICD-10-CM | POA: Diagnosis present

## 2020-10-26 DIAGNOSIS — Z9841 Cataract extraction status, right eye: Secondary | ICD-10-CM | POA: Diagnosis not present

## 2020-10-26 DIAGNOSIS — Z961 Presence of intraocular lens: Secondary | ICD-10-CM | POA: Diagnosis present

## 2020-10-26 DIAGNOSIS — J9621 Acute and chronic respiratory failure with hypoxia: Secondary | ICD-10-CM | POA: Diagnosis present

## 2020-10-26 DIAGNOSIS — I110 Hypertensive heart disease with heart failure: Secondary | ICD-10-CM | POA: Diagnosis present

## 2020-10-26 DIAGNOSIS — I471 Supraventricular tachycardia: Secondary | ICD-10-CM | POA: Diagnosis not present

## 2020-10-26 DIAGNOSIS — H353130 Nonexudative age-related macular degeneration, bilateral, stage unspecified: Secondary | ICD-10-CM | POA: Diagnosis present

## 2020-10-26 DIAGNOSIS — Z87442 Personal history of urinary calculi: Secondary | ICD-10-CM | POA: Diagnosis not present

## 2020-10-26 DIAGNOSIS — G9341 Metabolic encephalopathy: Secondary | ICD-10-CM | POA: Diagnosis present

## 2020-10-26 DIAGNOSIS — Z85820 Personal history of malignant melanoma of skin: Secondary | ICD-10-CM

## 2020-10-26 DIAGNOSIS — T380X5A Adverse effect of glucocorticoids and synthetic analogues, initial encounter: Secondary | ICD-10-CM | POA: Diagnosis present

## 2020-10-26 DIAGNOSIS — R0602 Shortness of breath: Secondary | ICD-10-CM

## 2020-10-26 DIAGNOSIS — Z85828 Personal history of other malignant neoplasm of skin: Secondary | ICD-10-CM

## 2020-10-26 DIAGNOSIS — Z96651 Presence of right artificial knee joint: Secondary | ICD-10-CM | POA: Diagnosis present

## 2020-10-26 DIAGNOSIS — I10 Essential (primary) hypertension: Secondary | ICD-10-CM | POA: Diagnosis present

## 2020-10-26 DIAGNOSIS — E782 Mixed hyperlipidemia: Secondary | ICD-10-CM | POA: Diagnosis present

## 2020-10-26 DIAGNOSIS — Z9079 Acquired absence of other genital organ(s): Secondary | ICD-10-CM | POA: Diagnosis not present

## 2020-10-26 DIAGNOSIS — Z923 Personal history of irradiation: Secondary | ICD-10-CM | POA: Diagnosis not present

## 2020-10-26 DIAGNOSIS — J1282 Pneumonia due to coronavirus disease 2019: Secondary | ICD-10-CM | POA: Diagnosis present

## 2020-10-26 DIAGNOSIS — Z66 Do not resuscitate: Secondary | ICD-10-CM | POA: Diagnosis present

## 2020-10-26 DIAGNOSIS — J9600 Acute respiratory failure, unspecified whether with hypoxia or hypercapnia: Secondary | ICD-10-CM

## 2020-10-26 DIAGNOSIS — J9601 Acute respiratory failure with hypoxia: Secondary | ICD-10-CM

## 2020-10-26 DIAGNOSIS — Z7952 Long term (current) use of systemic steroids: Secondary | ICD-10-CM

## 2020-10-26 DIAGNOSIS — Z9842 Cataract extraction status, left eye: Secondary | ICD-10-CM

## 2020-10-26 DIAGNOSIS — Z79899 Other long term (current) drug therapy: Secondary | ICD-10-CM

## 2020-10-26 DIAGNOSIS — Z83438 Family history of other disorder of lipoprotein metabolism and other lipidemia: Secondary | ICD-10-CM

## 2020-10-26 DIAGNOSIS — E875 Hyperkalemia: Secondary | ICD-10-CM | POA: Diagnosis not present

## 2020-10-26 DIAGNOSIS — G250 Essential tremor: Secondary | ICD-10-CM | POA: Diagnosis present

## 2020-10-26 DIAGNOSIS — Z91040 Latex allergy status: Secondary | ICD-10-CM

## 2020-10-26 DIAGNOSIS — Z8572 Personal history of non-Hodgkin lymphomas: Secondary | ICD-10-CM

## 2020-10-26 HISTORY — DX: Mixed hyperlipidemia: E78.2

## 2020-10-26 LAB — URINALYSIS, ROUTINE W REFLEX MICROSCOPIC
Bilirubin Urine: NEGATIVE
Glucose, UA: NEGATIVE mg/dL
Hgb urine dipstick: NEGATIVE
Ketones, ur: NEGATIVE mg/dL
Leukocytes,Ua: NEGATIVE
Nitrite: NEGATIVE
Protein, ur: NEGATIVE mg/dL
Specific Gravity, Urine: 1.026 (ref 1.005–1.030)
pH: 6 (ref 5.0–8.0)

## 2020-10-26 LAB — I-STAT ARTERIAL BLOOD GAS, ED
Acid-Base Excess: 5 mmol/L — ABNORMAL HIGH (ref 0.0–2.0)
Bicarbonate: 29.6 mmol/L — ABNORMAL HIGH (ref 20.0–28.0)
Calcium, Ion: 1.14 mmol/L — ABNORMAL LOW (ref 1.15–1.40)
HCT: 41 % (ref 39.0–52.0)
Hemoglobin: 13.9 g/dL (ref 13.0–17.0)
O2 Saturation: 92 %
Patient temperature: 98.1
Potassium: 4 mmol/L (ref 3.5–5.1)
Sodium: 136 mmol/L (ref 135–145)
TCO2: 31 mmol/L (ref 22–32)
pCO2 arterial: 40.2 mmHg (ref 32.0–48.0)
pH, Arterial: 7.474 — ABNORMAL HIGH (ref 7.350–7.450)
pO2, Arterial: 58 mmHg — ABNORMAL LOW (ref 83.0–108.0)

## 2020-10-26 LAB — COMPREHENSIVE METABOLIC PANEL
ALT: 59 U/L — ABNORMAL HIGH (ref 0–44)
AST: 22 U/L (ref 15–41)
Albumin: 2.8 g/dL — ABNORMAL LOW (ref 3.5–5.0)
Alkaline Phosphatase: 48 U/L (ref 38–126)
Anion gap: 10 (ref 5–15)
BUN: 13 mg/dL (ref 8–23)
CO2: 26 mmol/L (ref 22–32)
Calcium: 8.3 mg/dL — ABNORMAL LOW (ref 8.9–10.3)
Chloride: 100 mmol/L (ref 98–111)
Creatinine, Ser: 0.72 mg/dL (ref 0.61–1.24)
GFR, Estimated: >60 mL/min (ref >60)
Glucose, Bld: 159 mg/dL — ABNORMAL HIGH (ref 70–99)
Potassium: 3.7 mmol/L (ref 3.5–5.1)
Sodium: 136 mmol/L (ref 135–145)
Total Bilirubin: 1.1 mg/dL (ref 0.3–1.2)
Total Protein: 5.9 g/dL — ABNORMAL LOW (ref 6.5–8.1)

## 2020-10-26 LAB — CBC WITH DIFFERENTIAL/PLATELET
Abs Immature Granulocytes: 0.17 10*3/uL — ABNORMAL HIGH (ref 0.00–0.07)
Basophils Absolute: 0 10*3/uL (ref 0.0–0.1)
Basophils Relative: 0 %
Eosinophils Absolute: 0 10*3/uL (ref 0.0–0.5)
Eosinophils Relative: 0 %
HCT: 44.7 % (ref 39.0–52.0)
Hemoglobin: 14.1 g/dL (ref 13.0–17.0)
Immature Granulocytes: 2 %
Lymphocytes Relative: 2 %
Lymphs Abs: 0.3 10*3/uL — ABNORMAL LOW (ref 0.7–4.0)
MCH: 31.5 pg (ref 26.0–34.0)
MCHC: 31.5 g/dL (ref 30.0–36.0)
MCV: 100 fL (ref 80.0–100.0)
Monocytes Absolute: 1.7 10*3/uL — ABNORMAL HIGH (ref 0.1–1.0)
Monocytes Relative: 15 %
Neutro Abs: 8.9 10*3/uL — ABNORMAL HIGH (ref 1.7–7.7)
Neutrophils Relative %: 81 %
Platelets: 236 10*3/uL (ref 150–400)
RBC: 4.47 MIL/uL (ref 4.22–5.81)
RDW: 13 % (ref 11.5–15.5)
WBC: 11 10*3/uL — ABNORMAL HIGH (ref 4.0–10.5)
nRBC: 0 % (ref 0.0–0.2)

## 2020-10-26 LAB — PROTIME-INR
INR: 1.2 (ref 0.8–1.2)
Prothrombin Time: 14.4 seconds (ref 11.4–15.2)

## 2020-10-26 LAB — LACTIC ACID, PLASMA: Lactic Acid, Venous: 1.7 mmol/L (ref 0.5–1.9)

## 2020-10-26 LAB — STREP PNEUMONIAE URINARY ANTIGEN: Strep Pneumo Urinary Antigen: NEGATIVE

## 2020-10-26 MED ORDER — SODIUM CHLORIDE 0.9 % IV SOLN
500.0000 mg | INTRAVENOUS | Status: DC
  Administered 2020-10-26: 500 mg via INTRAVENOUS
  Filled 2020-10-26: qty 500

## 2020-10-26 MED ORDER — ZINC SULFATE 220 (50 ZN) MG PO CAPS
220.0000 mg | ORAL_CAPSULE | Freq: Every day | ORAL | Status: DC
  Administered 2020-10-26 – 2020-11-11 (×17): 220 mg via ORAL
  Filled 2020-10-26 (×17): qty 1

## 2020-10-26 MED ORDER — SIMVASTATIN 20 MG PO TABS
40.0000 mg | ORAL_TABLET | Freq: Every day | ORAL | Status: DC
  Administered 2020-10-26 – 2020-11-10 (×15): 40 mg via ORAL
  Filled 2020-10-26 (×15): qty 2

## 2020-10-26 MED ORDER — ONDANSETRON HCL 4 MG PO TABS: 4.0000 mg | ORAL_TABLET | Freq: Four times a day (QID) | ORAL | Status: DC | PRN

## 2020-10-26 MED ORDER — ALBUTEROL SULFATE HFA 108 (90 BASE) MCG/ACT IN AERS
2.0000 | INHALATION_SPRAY | Freq: Four times a day (QID) | RESPIRATORY_TRACT | Status: DC | PRN
  Filled 2020-10-26: qty 6.7

## 2020-10-26 MED ORDER — ONDANSETRON HCL 4 MG/2ML IJ SOLN: 4.0000 mg | Freq: Four times a day (QID) | INTRAMUSCULAR | Status: DC | PRN

## 2020-10-26 MED ORDER — LOSARTAN POTASSIUM 50 MG PO TABS
100.0000 mg | ORAL_TABLET | Freq: Every day | ORAL | Status: DC
  Administered 2020-10-27 – 2020-10-28 (×2): 100 mg via ORAL
  Filled 2020-10-26 (×2): qty 2

## 2020-10-26 MED ORDER — ASCORBIC ACID 500 MG PO TABS
500.0000 mg | ORAL_TABLET | Freq: Every day | ORAL | Status: DC
  Administered 2020-10-26 – 2020-11-11 (×17): 500 mg via ORAL
  Filled 2020-10-26 (×18): qty 1

## 2020-10-26 MED ORDER — GUAIFENESIN-DM 100-10 MG/5ML PO SYRP
10.0000 mL | ORAL_SOLUTION | ORAL | Status: DC | PRN
  Administered 2020-10-28 – 2020-11-07 (×8): 10 mL via ORAL
  Filled 2020-10-26 (×10): qty 10

## 2020-10-26 MED ORDER — POLYETHYLENE GLYCOL 3350 17 G PO PACK
17.0000 g | PACK | Freq: Every day | ORAL | Status: DC | PRN
  Administered 2020-10-27 – 2020-11-08 (×2): 17 g via ORAL
  Filled 2020-10-26: qty 1

## 2020-10-26 MED ORDER — METHYLPREDNISOLONE SODIUM SUCC 125 MG IJ SOLR
0.5000 mg/kg | Freq: Two times a day (BID) | INTRAMUSCULAR | Status: AC
  Administered 2020-10-26 – 2020-10-29 (×6): 43.75 mg via INTRAVENOUS
  Filled 2020-10-26 (×6): qty 2

## 2020-10-26 MED ORDER — ENOXAPARIN SODIUM 40 MG/0.4ML ~~LOC~~ SOLN
40.0000 mg | SUBCUTANEOUS | Status: DC
  Administered 2020-10-26 – 2020-11-10 (×15): 40 mg via SUBCUTANEOUS
  Filled 2020-10-26 (×15): qty 0.4

## 2020-10-26 MED ORDER — PREDNISONE 20 MG PO TABS
50.0000 mg | ORAL_TABLET | Freq: Every day | ORAL | Status: DC
  Administered 2020-10-29 – 2020-10-31 (×3): 50 mg via ORAL
  Filled 2020-10-26 (×3): qty 2

## 2020-10-26 MED ORDER — CEFTRIAXONE SODIUM 2 G IJ SOLR
2.0000 g | INTRAMUSCULAR | Status: DC
  Administered 2020-10-26: 2 g via INTRAVENOUS
  Filled 2020-10-26: qty 20

## 2020-10-26 MED ORDER — ACETAMINOPHEN 325 MG PO TABS
650.0000 mg | ORAL_TABLET | Freq: Four times a day (QID) | ORAL | Status: DC | PRN
  Administered 2020-10-27 – 2020-10-28 (×2): 650 mg via ORAL
  Filled 2020-10-26 (×2): qty 2

## 2020-10-26 MED ORDER — IOHEXOL 350 MG/ML SOLN
75.0000 mL | Freq: Once | INTRAVENOUS | Status: AC | PRN
  Administered 2020-10-26: 75 mL via INTRAVENOUS

## 2020-10-26 MED ORDER — DIPHENHYDRAMINE HCL 25 MG PO CAPS
25.0000 mg | ORAL_CAPSULE | Freq: Every evening | ORAL | Status: DC | PRN
  Administered 2020-10-27 – 2020-10-31 (×4): 25 mg via ORAL
  Filled 2020-10-26 (×4): qty 1

## 2020-10-26 NOTE — ED Notes (Signed)
Dinner Trays Ordered @ [phone].

## 2020-10-26 NOTE — H&P (Signed)
History and Physical    Antonio [name] FBP:[id number] DOB: 1941-05-28 DOA: 10/26/2020  PCP: [name], DO  Patient coming from: Home via EMS   Chief Complaint:  Chief Complaint  Patient presents with  . Shortness of Breath  . Covid Positive     HPI:    80 year old male with past medical history of B-cell lymphoma, hypertension, hyperlipidemia, essential tremor and recent diagnosis of COVID-19 infection who presents to [facility] emergency department due to progressively worsening lethargy confusion shortness of breath and hypoxia.  Of note, patient was recently diagnosed with COVID-19 on 10/14/2020.  Patient was then admitted to [facility] from 1/4 until 1/7.  Patient was treated with several days of intravenous remdesivir and steroids.  Patient was sent home on 2 L of oxygen via nasal cannula on 1/7.  Arrangements were made for the patient to receive intravenous remdesivir in the outpatient infusion center.  Patient is a somewhat poor historian due to encephalopathy and therefore the majority the history is been obtained from both the daughter and wife via phone conversation.    According to the daughter, patient was only able to receive 1 dose of outpatient intravenous remdesivir.  Family explains that in the days since patient's discharge patient has exhibited increasingly worsening lethargy.  Patient has been "sitting in his chair all day."  Patient has been exhibiting associated poor appetite.  There have been no associated fevers.  Is exhibited no evidence of nausea or vomiting.  Over the span of time patient has exhibited worsening shortness of breath with occasional cough.  Patient has also exhibited intermittent confusion over the span of time.  Daughter reports that on the day of his presentation to the emergency department, home pulse oximetry was 77%.  He was at this point that EMS was contacted and the patient was brought into [facility]  emergency department for evaluation.  In the emergency department patient has been found to be lethargic, tachypneic and exhibiting substantial hypoxia requiring significant amounts of supplemental oxygen.  Patient underwent CT angiogram of the chest which reveals no evidence of pulmonary embolism but does reveal evidence of persisting bilateral airspace disease.  The hospitalist group was then called to assess the patient for admission to the hospital.  Review of Systems:   Review of Systems  Unable to perform ROS: Mental status change    Past Medical History:  Diagnosis Date  . Age-related macular degeneration, dry, both eyes   . 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 Carotid stenosis, asymptomatic, left ?  . ED (erectile dysfunction)   . Elevated diaphragm    right  . Erectile dysfunction   . History of basal cell carcinoma (BCC) excision    multiple excision in office  . History of kidney stones   . History of seizure    03-03-2020 per pt x1 while taking interferan for melanoma in 2001 ,  none since, told a side effect of interferan  . History of squamous cell carcinoma excision    multiple skin excision in office  . Hyperlipidemia   . Hypertension    followed by pcp  (03-03-2020 per pt had a stress test approx. 2010, told normal)  . LBBB (left bundle branch block)   . Lymphoma of testis ([place]) 2000  s/p right orchiectomy   . Mass of left testis   . MGUS (monoclonal gammopathy of unknown significance)    oncologist--- dr Irene Limbo  . Mixed hyperlipidemia 10/26/2020  . Personal history of malignant melanoma of skin dermatologist--- dr Wilhemina Bonito   dx 2001 left calf area  s/p WLE ,  completed one year interferan (03-03-2020 pt states did not involve lymph nodes, no recurrence)  . Urgency of  urination   . Wears hearing aid in both ears     Past Surgical History:  Procedure Laterality Date  . CATARACT EXTRACTION W/ INTRAOCULAR LENS IMPLANT Bilateral 2018  . IR IMAGING GUIDED PORT INSERTION  03/31/2020  . MELANOMA EXCISION  10/1999  . ORCHIECTOMY Right 2000  . ORCHIECTOMY Left 03/05/2020   Procedure: ORCHIECTOMY;  Surgeon: [name], MD;  Location: [facility];  Service: Urology;  Laterality: Left;  . TONSILLECTOMY  child  . TOTAL KNEE ARTHROPLASTY Right 06/2009     reports that he has never smoked. He has never used smokeless tobacco. He reports current alcohol use of about 14.0 standard drinks of alcohol per week. He reports that he does not use drugs.  Allergies  Allergen Reactions  . Latex Itching    Latex tape causes itching and redness.    Family History  Problem Relation Age of Onset  . Cataracts Mother   . Transient ischemic attack Mother   . Asthma Mother   . Cataracts Father   . Diabetes Father        borderline  . Hypertension Father   . Hyperlipidemia Father   . Hyperlipidemia Son   . Hypertension Son   . Amblyopia Neg Hx   . Blindness Neg Hx   . Glaucoma Neg Hx   . Macular degeneration Neg Hx   . Retinal detachment Neg Hx   . Strabismus Neg Hx   . Retinitis pigmentosa Neg Hx      Prior to Admission medications   Medication Sig Start Date End Date Taking? Authorizing Provider  albuterol (VENTOLIN HFA) 108 (90 Base) MCG/ACT inhaler Inhale 2 puffs into the lungs every 6 (six) hours as needed for shortness of breath. 10/19/20  Yes Reed, Tiffany L, DO  ascorbic acid (VITAMIN C) 500 MG tablet Take 1 tablet (500 mg total) by mouth daily. 10/19/20  Yes Reed, Tiffany L, DO  benzonatate (TESSALON) 100 MG capsule Take 1 capsule (100 mg total) by mouth 3 (three) times daily as needed for cough. 10/15/20  Yes Reed, Tiffany L, DO  chlorpheniramine-HYDROcodone (TUSSIONEX PENNKINETIC ER) 10-8 MG/5ML SUER Take 5 mLs by mouth at bedtime as needed  for cough. 10/19/20  Yes Reed, Tiffany L, DO  Cholecalciferol 100 MCG (4000 UT) CAPS Take 1 capsule (4,000 Units total) by mouth daily. 10/19/20  Yes Reed, Tiffany L, DO  Cyanocobalamin (B-12 PO) Take 1 tablet by mouth daily.   Yes [provider]  diphenhydramine-acetaminophen (TYLENOL PM) 25-500 MG TABS tablet Take 1 tablet by mouth at bedtime as needed (sleep).   Yes [provider]  Ketotifen Fumarate (REFRESH EYE ITCH RELIEF OP) Place 1 drop into both eyes daily as needed (itchy eyes).    Yes [provider]  losartan (COZAAR) 100 MG tablet Take 1 tablet (100 mg total) by mouth daily. 02/17/20  Yes Reed, Tiffany L, DO  meloxicam (MOBIC) 7.5 MG tablet TAKE 1 TABLET (7.5 MG TOTAL) BY MOUTH 2 (TWO) TIMES DAILY AS NEEDED FOR PAIN. 09/07/20  Yes Reed, Tiffany L, DO  Multiple Vitamins-Minerals (PRESERVISION AREDS 2 PO) Take 1 capsule by mouth 2 (two) times daily.    Yes [provider]  predniSONE (DELTASONE) 10 MG tablet Take 40 mg daily for 1 day, 30 mg daily for 1 day, 20 mg daily for 1 days,10 mg daily for 1 day, then stop 10/23/20  Yes Ghimire, Henreitta Leber, MD  simvastatin (ZOCOR) 40 MG tablet TAKE 1 TABLET BY MOUTH EVERY DAY Patient taking differently: Take 40 mg by mouth daily at 6 PM. 06/24/20  Yes Reed, Tiffany L, DO  Testosterone 20.25 MG/ACT (1.62%) GEL Apply 40.5 mg topically daily. 03/23/20  Yes [provider]  valACYclovir (VALTREX) 1000 MG tablet Take one tablet by mouth twice daily for 3 days as needed for flares. Patient taking differently: Take 500 mg by mouth See admin instructions. Take one tablet by mouth twice daily for 3 days as needed for flares. 09/09/20  Yes Reed, Tiffany L, DO  propranolol (INDERAL) 10 MG tablet 1 tablet up to three times a day for tremor Patient taking differently: Take 10 mg by mouth See admin instructions. 1 tablet up to three times a day for tremor 09/17/20   Reed, Tiffany L, DO  zinc gluconate 50 MG tablet Take 2 tablets  (100 mg total) by mouth daily. 10/19/20   [name], DO    Physical Exam: Vitals:   10/26/20 1900 10/26/20 2000 10/26/20 2200 10/26/20 2300  BP: 126/78 138/78 131/77 137/71  Pulse: 97 98 89 91  Resp: (!) 28 (!) 25 (!) 22 (!) 21  Temp:      TempSrc:      SpO2: 93% 90% 93% 90%  Weight:      Height:        Constitutional: Lethargic but arousable, oriented x3, patient is in mild respiratory distress. Skin: no rashes, no lesions, poor skin turgor noted. Eyes: Pupils are equally reactive to light.  No evidence of scleral icterus or conjunctival pallor.  ENMT: Somewhat dry mucous membranes noted.  Posterior pharynx clear of any exudate or lesions.   Neck: normal, supple, no masses, no thyromegaly.  No evidence of jugular venous distension.   Respiratory: Coarse breath sounds with bibasilar and mid field rales.  No evidence of wheezing.  Somewhat tachypneic without accessory muscle use.  Cardiovascular: Regular rate and rhythm, no murmurs / rubs / gallops. No extremity edema. 2+ pedal pulses. No carotid bruits.  Chest:   Nontender without crepitus or deformity.   Back:   Nontender without crepitus or deformity. Abdomen: Abdomen is soft and nontender.  No evidence of intra-abdominal masses.  Positive bowel sounds noted in all quadrants.   Musculoskeletal: No joint deformity upper and lower extremities. Good ROM, no contractures. Normal muscle tone.  Neurologic: Patient is lethargic but arousable and oriented x3.  CN 2-12 grossly intact. Sensation intact.  Patient moving all 4 extremities spontaneously.  Patient is following all commands.  Patient is responsive to verbal stimuli.   Psychiatric: Patient is quite lethargic making assessment difficult.  Patient currently does not seem to possess insight as to his current situation however.   Labs on Admission: I have personally reviewed following labs and imaging studies -   CBC: Recent Labs  Lab 10/20/20 1400 10/21/20 0035 10/21/20 0348  10/22/20 0156 10/23/20 0350 10/26/20 1046 10/26/20 1540  WBC 6.6 4.7  --  3.5* 7.5 11.0*  --   NEUTROABS 5.0 4.2  --  3.0 6.6 8.9*  --   HGB 6.3* 14.1 14.2  14.3 13.7 14.1 13.9  HCT 19.5* 43.9 42.3 43.0 39.9 44.7 41.0  MCV 101.0* 99.1  --  96.0 97.1 100.0  --   PLT 291 184  --  241 296 236  --    Basic Metabolic Panel: Recent Labs  Lab 10/20/20 1400 10/21/20 0348 10/22/20 0156 10/23/20 0350 10/26/20 1046 10/26/20 1540  NA 135 136 137 137 136 136  K 4.1 4.6 4.5 4.0 3.7 4.0  CL 98 99 101 100 100  --   CO2 26 27 25 26 26   --   GLUCOSE 100* 150* 188* 192* 159*  --   BUN 9 7* 16 24* 13  --   CREATININE 0.57* 0.68 0.87 0.83 0.72  --   CALCIUM 8.6* 8.3* 8.4* 8.4* 8.3*  --    GFR: Estimated Creatinine Clearance: 79.5 mL/min (by C-G formula based on SCr of 0.72 mg/dL). Liver Function Tests: Recent Labs  Lab 10/20/20 1400 10/21/20 0348 10/22/20 0156 10/23/20 0350 10/26/20 1046  AST 75* 98* 79* 52* 22  ALT 67* 88* 90* 78* 59*  ALKPHOS 45 56 49 49 48  BILITOT 0.4 0.9 0.6 0.8 1.1  PROT 6.2* 6.2* 5.8* 5.7* 5.9*  ALBUMIN 3.3* 3.0* 2.6* 2.8* 2.8*   No results for input(s): LIPASE, AMYLASE in the last 168 hours. No results for input(s): AMMONIA in the last 168 hours. Coagulation Profile: Recent Labs  Lab 10/26/20 1046  INR 1.2   Cardiac Enzymes: No results for input(s): CKTOTAL, CKMB, CKMBINDEX, TROPONINI in the last 168 hours. BNP (last 3 results) No results for input(s): PROBNP in the last 8760 hours. HbA1C: No results for input(s): HGBA1C in the last 72 hours. CBG: No results for input(s): GLUCAP in the last 168 hours. Lipid Profile: No results for input(s): CHOL, HDL, LDLCALC, TRIG, CHOLHDL, LDLDIRECT in the last 72 hours. Thyroid Function Tests: No results for input(s): TSH, T4TOTAL, FREET4, T3FREE, THYROIDAB in the last 72 hours. Anemia Panel: No results for input(s): VITAMINB12, FOLATE, FERRITIN, TIBC, IRON, RETICCTPCT in the last 72 hours. Urine analysis:     Component Value Date/Time   COLORURINE YELLOW 10/26/2020 1501   APPEARANCEUR CLEAR 10/26/2020 1501   LABSPEC 1.026 10/26/2020 1501   PHURINE 6.0 10/26/2020 1501   GLUCOSEU NEGATIVE 10/26/2020 1501   HGBUR NEGATIVE 10/26/2020 1501   BILIRUBINUR NEGATIVE 10/26/2020 1501   KETONESUR NEGATIVE 10/26/2020 1501   PROTEINUR NEGATIVE 10/26/2020 1501   NITRITE NEGATIVE 10/26/2020 1501   LEUKOCYTESUR NEGATIVE 10/26/2020 1501    Radiological Exams on Admission - Personally Reviewed: CT Angio Chest PE W and/or Wo Contrast  Result Date: 10/26/2020 CLINICAL DATA:  Hypoxia, shortness of breath EXAM: CT ANGIOGRAPHY CHEST WITH CONTRAST TECHNIQUE: Multidetector CT imaging of the chest was performed using the standard protocol during bolus administration of intravenous contrast. Multiplanar CT image reconstructions and MIPs were obtained to evaluate the vascular anatomy. CONTRAST:  65mL OMNIPAQUE IOHEXOL 350 MG/ML SOLN COMPARISON:  10/20/2020 FINDINGS: Cardiovascular: Satisfactory opacification of the pulmonary arteries to the segmental level. No evidence of pulmonary embolism. Normal heart size. No pericardial effusion. Mediastinum/Nodes: No enlarged mediastinal, hilar, or axillary lymph nodes. Thyroid gland, trachea, and esophagus demonstrate no significant findings. Lungs/Pleura: No pleural effusion or pneumothorax. Patchy ground-glass airspace disease in the upper lobe in a peripheral distribution bilaterally, right greater than left. Patchy right lower lobe airspace disease with associated right lower lobe atelectasis. Patchy ground-glass opacities in the right middle lobe and left lower lobe. Upper Abdomen: No acute abnormality. Musculoskeletal: No acute osseous  abnormality. No aggressive osseous lesion. Review of the MIP images confirms the above findings. IMPRESSION: 1. No evidence of pulmonary embolus. 2. Bilateral airspace disease with a predominantly peripheral distribution. Findings are concerning for  multilobar pneumonia including atypical viral pneumonia. Electronically Signed   By: Kathreen Devoid   On: 10/26/2020 14:52   DG Chest Port 1 View  Result Date: 10/26/2020 CLINICAL DATA:  Ongoing shortness of breath. COVID-19 diagnosed 2 weeks ago EXAM: PORTABLE CHEST 1 VIEW COMPARISON:  Six days ago FINDINGS: Chronic elevation of the right diaphragm. Bilateral peripheral airspace disease with increased involvement in the left lung peripherally. Borderline heart size accentuated by diaphragm elevation. Porta catheter on the right with tip at the SVC. No visible air leak or suspected pulmonary edema. Extensive artifact from EKG leads. IMPRESSION: 1. Multifocal pneumonia with progressive opacity from 6 days ago. 2. Chronic elevation of the right diaphragm. Electronically Signed   By: Monte Fantasia M.D.   On: 10/26/2020 11:04    EKG: Personally reviewed.  Rhythm is sinus tachycardia with heart rate of 104 bpm.  Evidence of left bundle branch block no dynamic ST segment changes appreciated.  Assessment/Plan Principal Problem:   Acute respiratory failure with hypoxia [facility])   Patient presenting with evidence of acute hypoxic respiratory failure, substantial progression compared to discharge on 1/7.  Review of previous hospitalization reveals inflammatory markers were downtrending the entire hospitalization, making me question whether this hospitalization is secondary to a bacterial coinfection  Patient has already received a 4-day course of remdesivir (got one dose as an outpatient) as well as steroids.  Will place patient back on steroids for now and empirically treat with intravenous antibacterials  In the meantime, obtaining inflammatory markers and procalcitonin.  If procalcitonin is unremarkable then antibacterials can be discontinued  If patient's oxygen requirements continued to increase with normal procalcitonin then consideration can be undertaken for initiation of tocilizumab or  baricitinib   Continue to provide patient with submental oxygen  There and bronchodilator therapy  As needed antitussives  Continuing zinc and vitamin C supplementation   Active Problems:   COVID-19 virus infection   Please see assessment and plan above.    Acute metabolic encephalopathy   Patient presenting with acute metabolic encephalopathy secondary to underlying infection and hypoxia  Treating underlying condition with submental oxygen and intravenous antibacterials  Monitoring for symptomatic improvement    Essential hypertension  Continue home regimen of antihypertensive therapy    Mixed hyperlipidemia   Continue home regimen of statin therapy    Code Status:  DNR Family Communication: Patient's daughter and wife have been contacted via phone conversation and have been updated on plan of care.  Status is: Inpatient  Remains inpatient appropriate because:Ongoing diagnostic testing needed not appropriate for outpatient work up, IV treatments appropriate due to intensity of illness or inability to take PO and Inpatient level of care appropriate due to severity of illness   Dispo: The patient is from: Home              Anticipated d/c is to: Home              Anticipated d/c date is: > 3 days              Patient currently is not medically stable to d/c.        Vernelle Emerald MD [facility] Pager [phone]  If 7PM-7AM, please contact night-coverage [email or web address] Use universal [place] password for that web site.  If you do not have the password, please call the hospital operator.  10/26/2020, 11:44 PM

## 2020-10-26 NOTE — ED Notes (Signed)
Pt transferred to hospital bed for comfort and assistance provided with urinal.

## 2020-10-26 NOTE — ED Triage Notes (Signed)
Pt BIB [facility] for ongoing SOB, pt dx w/Covid 2 weeks ago, dc from inpatient 3 days ago for the same. Pt sats were 74% 4L [place], increased to 94% 15L [facility]. Pt c/o ongoing fevers and chills, temp 101.2 with EMS, gave 1000mg  Tylenol 0940.   20G RAC 400cc bolus NS  Co2 28 Rhonchi LLL  HR 110 LLBB RR 26 BP 136/70 CBG 100

## 2020-10-26 NOTE — ED Provider Notes (Signed)
[facility] EMERGENCY DEPARTMENT Provider Note   CSN: [id number] Arrival date & time: 10/26/20  1008     History Chief Complaint  Patient presents with   Shortness of Breath   Covid Positive    Antonio [name] is a 80 y.o. male.  HPI 80 year old male with a history of B-cell lymphoma, basal cell carcinoma, hyperlipidemia, hypertension presents to the ER with shortness of breath and hypoxia.  Patient was admitted here on 10/20/2020 with acute respiratory failure in the setting of COVID-19.  He was discharged with supplemental oxygen for ambulation.  Patient states he had just continued to felt tired, and short of breath, however no more than when he was discharged.  He was discharged approximately 3 days ago.  EMS found the patient to have O2 sats at 74% on 4 L, increased to 94% with 15 L nonrebreather.  He states he continues to have fevers as high as 101.2.  Denies any chest pain.  Spoke with the patient's daughter [name], pt has been more lethargic, sleeping a lot. 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   ED (erectile dysfunction)    Elevated diaphragm    right   Erectile dysfunction    History of basal cell carcinoma (BCC) excision    multiple excision in office   History of kidney stones    History of seizure    03-03-2020 per pt x1 while taking interferan for melanoma in 2001 ,  none since, told a side effect of interferan   History of squamous cell carcinoma excision    multiple skin excision in office   Hyperlipidemia     Hypertension    followed by pcp  (03-03-2020 per pt had a stress test approx. 2010, told normal)   LBBB (left bundle branch block)    Lymphoma of testis ([place]) 2000   s/p right orchiectomy    Mass of left testis    MGUS (monoclonal gammopathy of unknown significance)    oncologist--- dr Irene Limbo   Personal history of malignant melanoma of skin dermatologist--- dr Wilhemina Bonito   dx 2001 left calf area  s/p WLE ,  completed one year interferan (03-03-2020 pt states did not involve lymph nodes, no recurrence)   Urgency of urination    Wears hearing aid in both ears     Patient Active Problem List   Diagnosis Date Noted   COVID-19 virus infection 10/26/2020   Symptomatic anemia 10/20/2020   Acute respiratory failure with hypoxia ([place]) 10/20/2020   Pleural effusion 10/20/2020   Testosterone deficiency 09/09/2020   S/P orchiectomy 09/09/2020   Diffuse large B cell lymphoma ([place]) 03/17/2020   Testicular malignant neoplasm ([place]) 03/17/2020   Counseling regarding advance care planning and goals of care 03/17/2020   Hyperglycemia 09/05/2018   Mass of soft tissue of left lower extremity 09/05/2018   Monoclonal gammopathy of unknown significance (MGUS) 09/05/2018   Macrocytosis without anemia 09/05/2018   Macular degeneration of both eyes 06/21/2017   Slow transit constipation 06/21/2017   Sleep-wake disorder 06/21/2017   Benign essential  HTN 06/21/2017   Pure hypercholesterolemia 06/21/2017   History of B-cell lymphoma 06/21/2017   Personal history of malignant melanoma 06/21/2017   Left carotid artery stenosis 06/21/2017   DDD (degenerative disc disease), cervical 06/21/2017   History of kidney stones 06/21/2017   Tubular adenoma of colon 06/21/2017   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   Vitamin D deficiency 06/21/2017    Past Surgical History:  Procedure Laterality Date   CATARACT EXTRACTION W/ INTRAOCULAR LENS IMPLANT Bilateral 2018   IR IMAGING  GUIDED PORT INSERTION  03/31/2020   MELANOMA EXCISION  10/1999   ORCHIECTOMY Right 2000   ORCHIECTOMY Left 03/05/2020   Procedure: ORCHIECTOMY;  Surgeon: [name], MD;  Location: [facility] - Closed;  Service: Urology;  Laterality: Left;   TONSILLECTOMY  child   TOTAL KNEE ARTHROPLASTY Right 06/2009       Family History  Problem Relation Age of Onset   Cataracts Mother    Transient ischemic attack Mother    Asthma Mother    Cataracts Father    Diabetes Father        borderline   Hypertension Father    Hyperlipidemia Father    Hyperlipidemia Son    Hypertension Son    Amblyopia Neg Hx    Blindness Neg Hx    Glaucoma Neg Hx    Macular degeneration Neg Hx    Retinal detachment Neg Hx    Strabismus Neg Hx    Retinitis pigmentosa Neg Hx     Social History   Tobacco Use   Smoking status: Never Smoker   Smokeless tobacco: Never Used  Vaping Use   Vaping Use: Never used  Substance Use Topics   Alcohol use: Yes    Alcohol/week: 14.0 standard drinks    Types: 14 Glasses of wine per week    Comment: 2 wine daily   Drug use: Never    Home Medications Prior to Admission medications   Medication Sig Start Date End Date Taking? Authorizing Provider  albuterol (VENTOLIN HFA) 108 (90 Base) MCG/ACT inhaler Inhale 2 puffs into the lungs every 6 (six) hours as needed for shortness of breath. 10/19/20  Yes Reed, Tiffany L, DO  ascorbic acid (VITAMIN C) 500 MG tablet Take 1 tablet (500 mg total) by mouth daily. 10/19/20  Yes Reed, Tiffany L, DO  benzonatate (TESSALON) 100 MG capsule Take 1 capsule (100 mg total) by mouth 3 (three) times daily as needed for cough. 10/15/20  Yes Reed, Tiffany L, DO  chlorpheniramine-HYDROcodone (TUSSIONEX PENNKINETIC ER) 10-8 MG/5ML SUER Take 5 mLs by mouth at bedtime as needed for cough. 10/19/20  Yes Reed, Tiffany L, DO  Cholecalciferol 100 MCG (4000 UT) CAPS Take 1 capsule (4,000 Units total) by mouth daily. 10/19/20  Yes  Reed, Tiffany L, DO  Cyanocobalamin (B-12 PO) Take 1 tablet by mouth daily.   Yes [provider]  diphenhydramine-acetaminophen (TYLENOL PM) 25-500 MG TABS tablet Take 1 tablet by mouth at bedtime as needed (sleep).   Yes [provider]  Ketotifen Fumarate (REFRESH EYE ITCH RELIEF OP) Place 1 drop into both eyes daily as needed (itchy eyes).    Yes [provider]  losartan (COZAAR) 100 MG tablet Take 1 tablet (100 mg total) by mouth daily. 02/17/20  Yes Reed, Tiffany L, DO  meloxicam (MOBIC) 7.5 MG tablet TAKE 1 TABLET (7.5 MG TOTAL) BY MOUTH 2 (TWO) TIMES DAILY AS NEEDED FOR PAIN. 09/07/20  Yes Reed, Tiffany L, DO  Multiple Vitamins-Minerals (PRESERVISION  AREDS 2 PO) Take 1 capsule by mouth 2 (two) times daily.    Yes [provider]  predniSONE (DELTASONE) 10 MG tablet Take 40 mg daily for 1 day, 30 mg daily for 1 day, 20 mg daily for 1 days,10 mg daily for 1 day, then stop 10/23/20  Yes Ghimire, Henreitta Leber, MD  simvastatin (ZOCOR) 40 MG tablet TAKE 1 TABLET BY MOUTH EVERY DAY Patient taking differently: Take 40 mg by mouth daily at 6 PM. 06/24/20  Yes Reed, Tiffany L, DO  Testosterone 20.25 MG/ACT (1.62%) GEL Apply 40.5 mg topically daily. 03/23/20  Yes [provider]  valACYclovir (VALTREX) 1000 MG tablet Take one tablet by mouth twice daily for 3 days as needed for flares. Patient taking differently: Take 500 mg by mouth See admin instructions. Take one tablet by mouth twice daily for 3 days as needed for flares. 09/09/20  Yes Reed, Tiffany L, DO  propranolol (INDERAL) 10 MG tablet 1 tablet up to three times a day for tremor Patient taking differently: Take 10 mg by mouth See admin instructions. 1 tablet up to three times a day for tremor 09/17/20   Reed, Tiffany L, DO  zinc gluconate 50 MG tablet Take 2 tablets (100 mg total) by mouth daily. 10/19/20   Reed, Tiffany L, DO    Allergies    Latex  Review of Systems   Review of Systems  Constitutional:  Positive for fever. Negative for chills and unexpected weight change.  HENT: Negative for ear pain and sore throat.   Eyes: Negative for pain and visual disturbance.  Respiratory: Positive for cough and shortness of breath.   Cardiovascular: Negative for chest pain and palpitations.  Gastrointestinal: Negative for abdominal pain and vomiting.  Genitourinary: Negative for dysuria and hematuria.  Musculoskeletal: Negative for arthralgias and back pain.  Skin: Negative for color change and rash.  Neurological: Positive for weakness. Negative for seizures and syncope.  All other systems reviewed and are negative.   Physical Exam Updated Vital Signs BP 128/77    Pulse 92    Temp 98.1 F (36.7 C) (Oral)    Resp (!) 22    Ht 6' (1.829 m)    Wt 87 kg    SpO2 93%    BMI 26.01 kg/m   Physical Exam Vitals and nursing note reviewed.  Constitutional:      Appearance: He is well-developed and well-nourished. He is ill-appearing.  HENT:     Head: Normocephalic and atraumatic.  Eyes:     Conjunctiva/sclera: Conjunctivae normal.  Cardiovascular:     Rate and Rhythm: Normal rate and regular rhythm.     Heart sounds: No murmur heard.   Pulmonary:     Effort: Pulmonary effort is normal. No respiratory distress.     Breath sounds: Normal breath sounds. No decreased breath sounds, wheezing or rhonchi.     Comments: On 15 L nonrebreather mask sats at 96% Abdominal:     Palpations: Abdomen is soft.     Tenderness: There is no abdominal tenderness.  Musculoskeletal:        General: No edema.     Cervical back: Neck supple.     Right lower leg: No edema.     Left lower leg: No edema.  Skin:    General: Skin is warm and dry.  Neurological:     Mental Status: He is alert.  Psychiatric:        Mood and Affect: Mood and affect normal.  ED Results / Procedures / Treatments   Labs (all labs ordered are listed, but only abnormal results are displayed) Labs Reviewed  COMPREHENSIVE METABOLIC  PANEL - Abnormal; Notable for the following components:      Result Value   Glucose, Bld 159 (*)    Calcium 8.3 (*)    Total Protein 5.9 (*)    Albumin 2.8 (*)    ALT 59 (*)    All other components within normal limits  CBC WITH DIFFERENTIAL/PLATELET - Abnormal; Notable for the following components:   WBC 11.0 (*)    Neutro Abs 8.9 (*)    Lymphs Abs 0.3 (*)    Monocytes Absolute 1.7 (*)    Abs Immature Granulocytes 0.17 (*)    All other components within normal limits  I-STAT ARTERIAL BLOOD GAS, ED - Abnormal; Notable for the following components:   pH, Arterial 7.474 (*)    pO2, Arterial 58 (*)    Bicarbonate 29.6 (*)    Acid-Base Excess 5.0 (*)    Calcium, Ion 1.14 (*)    All other components within normal limits  CULTURE, BLOOD (ROUTINE X 2)  CULTURE, BLOOD (ROUTINE X 2)  LACTIC ACID, PLASMA  PROTIME-INR  URINALYSIS, ROUTINE W REFLEX MICROSCOPIC  BLOOD GAS, ARTERIAL    EKG EKG Interpretation  Date/Time:  Monday October 26 2020 10:27:49 EST Ventricular Rate:  104 PR Interval:    QRS Duration: 131 QT Interval:  360 QTC Calculation: 474 R Axis:   -64 Text Interpretation: Sinus tachycardia Left bundle branch block Since prior ECG< rate has increased Confirmed by Gareth Morgan [phone]) on 10/26/2020 11:35:17 AM   Radiology CT Angio Chest PE W and/or Wo Contrast  Result Date: 10/26/2020 CLINICAL DATA:  Hypoxia, shortness of breath EXAM: CT ANGIOGRAPHY CHEST WITH CONTRAST TECHNIQUE: Multidetector CT imaging of the chest was performed using the standard protocol during bolus administration of intravenous contrast. Multiplanar CT image reconstructions and MIPs were obtained to evaluate the vascular anatomy. CONTRAST:  15mL OMNIPAQUE IOHEXOL 350 MG/ML SOLN COMPARISON:  10/20/2020 FINDINGS: Cardiovascular: Satisfactory opacification of the pulmonary arteries to the segmental level. No evidence of pulmonary embolism. Normal heart size. No pericardial effusion. Mediastinum/Nodes:  No enlarged mediastinal, hilar, or axillary lymph nodes. Thyroid gland, trachea, and esophagus demonstrate no significant findings. Lungs/Pleura: No pleural effusion or pneumothorax. Patchy ground-glass airspace disease in the upper lobe in a peripheral distribution bilaterally, right greater than left. Patchy right lower lobe airspace disease with associated right lower lobe atelectasis. Patchy ground-glass opacities in the right middle lobe and left lower lobe. Upper Abdomen: No acute abnormality. Musculoskeletal: No acute osseous abnormality. No aggressive osseous lesion. Review of the MIP images confirms the above findings. IMPRESSION: 1. No evidence of pulmonary embolus. 2. Bilateral airspace disease with a predominantly peripheral distribution. Findings are concerning for multilobar pneumonia including atypical viral pneumonia. Electronically Signed   By: Kathreen Devoid   On: 10/26/2020 14:52   DG Chest Port 1 View  Result Date: 10/26/2020 CLINICAL DATA:  Ongoing shortness of breath. COVID-19 diagnosed 2 weeks ago EXAM: PORTABLE CHEST 1 VIEW COMPARISON:  Six days ago FINDINGS: Chronic elevation of the right diaphragm. Bilateral peripheral airspace disease with increased involvement in the left lung peripherally. Borderline heart size accentuated by diaphragm elevation. Porta catheter on the right with tip at the SVC. No visible air leak or suspected pulmonary edema. Extensive artifact from EKG leads. IMPRESSION: 1. Multifocal pneumonia with progressive opacity from 6 days ago. 2. Chronic elevation of the  right diaphragm. Electronically Signed   By: Monte Fantasia M.D.   On: 10/26/2020 11:04    Procedures Procedures (including critical care time)  Medications Ordered in ED Medications  iohexol (OMNIPAQUE) 350 MG/ML injection 75 mL (75 mLs Intravenous Contrast Given 10/26/20 1447)    ED Course  I have reviewed the triage vital signs and the nursing notes.  Pertinent labs & imaging results that  were available during my care of the patient were reviewed by me and considered in my medical decision making (see chart for details).    MDM Rules/Calculators/A&P                          80 year old male with hypoxia in the setting of COVID-19. On arrival, patient on 15 L nonrebreather with sats at 96%.  Afebrile, not tachycardic or tachypneic.  Speaking in full sentences without increased work of breathing on my exam.  CBC with a leukocytosis of 11, CMP without any significant electrode abnormalities, mild elevation of ALT likely consistent with COVID-19.  Lactic acid normal.  PT and INR normal.  Chest x-ray with worsening pneumonia.  CT angio was ordered by myself in order to rule out possible PE, this however was negative and did confirm multifocal pneumonia.  ABG pending.  Nursing staff reports that the patient was weaned down to 6 L of oxygen, however dropped to 85 to 86% when ambulating to the bedside commode.  EKG on arrival sinus tach, however patient not tachycardic on my exam.  Patient will require admission.   Consulted Dr. Cyd Silence with the hospitalist team.  Reviewed i-STAT ABG, pH of 7.47, normal PCO2, bicarb of 29.6.  Patient appears to be compensating at this time.  Will admit to hospitalist team.  No need for BiPAP at this time.  This was a shared visit with my supervising physician Dr. Billy Fischer who independently saw and evaluated the patient & provided guidance in evaluation/management/disposition ,in agreement with care   Final Clinical Impression(s) / ED Diagnoses Final diagnoses:  Acute respiratory failure due to COVID-19 [facility])    Rx / [place] Orders ED Discharge Orders    None       Lyndel Safe 10/26/20 1601    Gareth Morgan, MD 10/27/20 1513

## 2020-10-26 NOTE — ED Notes (Signed)
Pt transported to CT ?

## 2020-10-27 ENCOUNTER — Telehealth: Payer: Self-pay

## 2020-10-27 DIAGNOSIS — U071 COVID-19: Secondary | ICD-10-CM | POA: Diagnosis not present

## 2020-10-27 DIAGNOSIS — J181 Lobar pneumonia, unspecified organism: Secondary | ICD-10-CM

## 2020-10-27 DIAGNOSIS — C8339 Diffuse large B-cell lymphoma, extranodal and solid organ sites: Secondary | ICD-10-CM | POA: Diagnosis not present

## 2020-10-27 DIAGNOSIS — G9341 Metabolic encephalopathy: Secondary | ICD-10-CM | POA: Diagnosis not present

## 2020-10-27 LAB — CBC WITH DIFFERENTIAL/PLATELET
Abs Immature Granulocytes: 0.18 10*3/uL — ABNORMAL HIGH (ref 0.00–0.07)
Basophils Absolute: 0 10*3/uL (ref 0.0–0.1)
Basophils Relative: 0 %
Eosinophils Absolute: 0 10*3/uL (ref 0.0–0.5)
Eosinophils Relative: 0 %
HCT: 44.4 % (ref 39.0–52.0)
Hemoglobin: 14.2 g/dL (ref 13.0–17.0)
Immature Granulocytes: 2 %
Lymphocytes Relative: 4 %
Lymphs Abs: 0.3 10*3/uL — ABNORMAL LOW (ref 0.7–4.0)
MCH: 31.4 pg (ref 26.0–34.0)
MCHC: 32 g/dL (ref 30.0–36.0)
MCV: 98.2 fL (ref 80.0–100.0)
Monocytes Absolute: 0.9 10*3/uL (ref 0.1–1.0)
Monocytes Relative: 11 %
Neutro Abs: 7.2 10*3/uL (ref 1.7–7.7)
Neutrophils Relative %: 83 %
Platelets: 272 10*3/uL (ref 150–400)
RBC: 4.52 MIL/uL (ref 4.22–5.81)
RDW: 12.9 % (ref 11.5–15.5)
WBC: 8.6 10*3/uL (ref 4.0–10.5)
nRBC: 0 % (ref 0.0–0.2)

## 2020-10-27 LAB — COMPREHENSIVE METABOLIC PANEL
ALT: 56 U/L — ABNORMAL HIGH (ref 0–44)
AST: 27 U/L (ref 15–41)
Albumin: 2.6 g/dL — ABNORMAL LOW (ref 3.5–5.0)
Alkaline Phosphatase: 57 U/L (ref 38–126)
Anion gap: 12 (ref 5–15)
BUN: 12 mg/dL (ref 8–23)
CO2: 25 mmol/L (ref 22–32)
Calcium: 8.3 mg/dL — ABNORMAL LOW (ref 8.9–10.3)
Chloride: 97 mmol/L — ABNORMAL LOW (ref 98–111)
Creatinine, Ser: 0.68 mg/dL (ref 0.61–1.24)
GFR, Estimated: >60 mL/min (ref >60)
Glucose, Bld: 143 mg/dL — ABNORMAL HIGH (ref 70–99)
Potassium: 4.6 mmol/L (ref 3.5–5.1)
Sodium: 134 mmol/L — ABNORMAL LOW (ref 135–145)
Total Bilirubin: 0.8 mg/dL (ref 0.3–1.2)
Total Protein: 5.8 g/dL — ABNORMAL LOW (ref 6.5–8.1)

## 2020-10-27 LAB — LEGIONELLA PNEUMOPHILA SEROGP 1 UR AG: L. pneumophila Serogp 1 Ur Ag: NEGATIVE

## 2020-10-27 LAB — D-DIMER, QUANTITATIVE: D-Dimer, Quant: 1.27 ug/mL-FEU — ABNORMAL HIGH (ref 0.00–0.50)

## 2020-10-27 LAB — FERRITIN: Ferritin: 1251 ng/mL — ABNORMAL HIGH (ref 24–336)

## 2020-10-27 LAB — PROCALCITONIN: Procalcitonin: 0.1 ng/mL

## 2020-10-27 LAB — LACTATE DEHYDROGENASE: LDH: 223 U/L — ABNORMAL HIGH (ref 98–192)

## 2020-10-27 LAB — MAGNESIUM: Magnesium: 2.3 mg/dL (ref 1.7–2.4)

## 2020-10-27 LAB — C-REACTIVE PROTEIN: CRP: 18.7 mg/dL — ABNORMAL HIGH (ref <1.0)

## 2020-10-27 NOTE — ED Notes (Signed)
Attempted report x 2. Per the secretary, everyone is still tied up. Soil scientist.

## 2020-10-27 NOTE — Telephone Encounter (Signed)
Call came in from [place]  to inform Dr. Mariea Clonts that the patient has been at the ED for the past 2 days and is being treated for Pulmonary infections and Dr. Mariea Clonts could call or text her for more information

## 2020-10-27 NOTE — ED Notes (Signed)
Attempted report x 3. Per Network engineer, everyone is in report at this time. Soil scientist

## 2020-10-27 NOTE — ED Notes (Signed)
Lunch tray provided to pt. Encouraged pt to try to at least eat something.

## 2020-10-27 NOTE — Plan of Care (Signed)

## 2020-10-27 NOTE — Hospital Course (Addendum)
80 year old man PMH B cell lymphoma last chemotherapy approximately 2000, discharged 1/7 after being treated for acute hypoxic respiratory failure secondary to [place] with positive test 12/29, treated with remdesivir and steroids.  Discharged on 2 L.  PRESENTED to the emergency department 1/11 with increasing shortness of breath and lethargy, hypoxia.  Admitted for acute hypoxic respiratory failure secondary to multifocal pneumonia secondary to COVID.  No signs or symptoms of bacterial infection.  Acute on chronic hypoxic respiratory failure secondary to COVID multifocal pneumonia with associated acute metabolic encephalopathy -- Appears stable at this point, appears to have been stable on 6 L for approximately 24 hours now, he is asymptomatic at this point with no shortness of breath.  Inflammatory markers elevated compared to previous discharge.  Continue steroids.  Previously treated with remdesivir.  If worsens consider Actemra.  Discussed in detail with patient as below as well as wife and sister, they are hesitant will consider.  CT chest on admit: No PE.  Bilateral airspace disease concerning for multilobar pneumonia atypical viral pneumonia.  No effusion.  Chest x-ray showed chronic elevation right diaphragm.   Fever: afebrile Oxygen requirement: 6L [place] Antibiotics: stopped Remdesivir: treated earlier this month Steroids: 1/10 > Actemra: Discussed in detail with patient risk and benefit, emergency use, off label use, side effects, he is reluctant at this point, will revisit if hypoxia worsens.  No history of TB, hepatitis or experimental medications.  Only caution seen his malignancy last treated with chemotherapy September 2021.  Will reach out to oncologist for opinion.  Discussed in detail with family. Vitamin C and Zinc Proning: recommended as tolerated   Inflammatory markers: CRP: 18.7 >  D-dimer: 1.27 >  Ferritin 1251 > Procalcitonin: .1 >  Acute metabolic encephalopathy appears  resolved at this point. --Continue treatment for COVID.  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 Irene Limbo

## 2020-10-27 NOTE — ED Notes (Signed)
Pt is NSR w/BBB on monitor 

## 2020-10-27 NOTE — ED Notes (Signed)
SDU Breakfast Ordered 

## 2020-10-27 NOTE — ED Notes (Signed)
Lunch Tray Ordered @ 1715. 

## 2020-10-27 NOTE — Progress Notes (Addendum)
PROGRESS NOTE  Antonio [name] ACZ:[id number] DOB: 13-Jan-1941 DOA: 10/26/2020 PCP: [name], DO  Brief History   80 year old man PMH B cell lymphoma last chemotherapy approximately 2000, discharged 1/7 after being treated for acute hypoxic respiratory failure secondary to [place] with positive test 12/29, treated with remdesivir and steroids.  Discharged on 2 L.  PRESENTED to the emergency department 1/11 with increasing shortness of breath and lethargy, hypoxia.  Admitted for acute hypoxic respiratory failure secondary to multifocal pneumonia secondary to COVID.  No signs or symptoms of bacterial infection.  Acute on chronic hypoxic respiratory failure secondary to COVID multifocal pneumonia with associated acute metabolic encephalopathy -- Appears stable at this point, appears to have been stable on 6 L for approximately 24 hours now, he is asymptomatic at this point with no shortness of breath.  Inflammatory markers elevated compared to previous discharge.  Continue steroids.  Previously treated with remdesivir.  If worsens consider Actemra.  Discussed in detail with patient as below as well as wife and sister, they are hesitant will consider.  . CT chest on admit: No PE.  Bilateral airspace disease concerning for multilobar pneumonia atypical viral pneumonia.  No effusion.  Chest x-ray showed chronic elevation right diaphragm.   . Fever: afebrile . Oxygen requirement: 6L [place] . Antibiotics: stopped . Remdesivir: treated earlier this month . Steroids: 1/10 > . Actemra: Discussed in detail with patient risk and benefit, emergency use, off label use, side effects, he is reluctant at this point, will revisit if hypoxia worsens.  No history of TB, hepatitis or experimental medications.  Only caution seen his malignancy last treated with chemotherapy September 2021.  Will reach out to oncologist for opinion.  Discussed in detail with family. ADDENDUM Reevaluated 1600. Doing well on 6L, no change. D/w  Dr. Irene Limbo, no absolute contraindication to Actemra, he is far enough out from chemo and cancer is in remission. Patient, wife, daughter consent to use of Actemra if needed . Vitamin C and Zinc . Proning: recommended as tolerated   Inflammatory markers: . CRP: 18.7 >  . D-dimer: 1.27 >  . Ferritin 1251 > . Procalcitonin: .1 >  Acute metabolic encephalopathy appears resolved at this point. --Continue treatment for COVID.  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 Irene Limbo  Disposition Plan:  Discussion: Currently stable on 6 L the last 24 hours, if his condition were to worsen would consider Actemra, discussed in detail with family and the patient, they are currently considering whether or not they would approve this.  We will hold off unless the patient worsens.  Keep progressive status.  Status is: Inpatient  Remains inpatient appropriate because:IV treatments appropriate due to intensity of illness or inability to take PO and Inpatient level of care appropriate due to severity of illness   Dispo: The patient is from: Home              Anticipated d/c is to: Home              Anticipated d/c date is: > 3 days              Patient currently is not medically stable to d/c.  DVT prophylaxis: enoxaparin (LOVENOX) injection 40 mg Start: 10/26/20 1715   Code Status: Full Code Family Communication: 56 minute conversation with wife and sister discussing current  care and treatment recommendations.  All questions answered.  Murray Hodgkins, MD  [facility] Direct contact: see [email or web address] (further directions at bottom of note if needed) 7PM-7AM contact night coverage as at bottom of note 10/27/2020, 11:32 AM  LOS: 1 day   Significant Hospital Events   .    Consults:  .    Procedures:  .   Significant  Diagnostic Tests:  Marland Kitchen    Micro Data:  .    Antimicrobials:  .   Interval History/Subjective  CC: f/u SOB  No shortness of breath now, no pain, no complaints.  Reports feeling about the same compared to yesterday.  Objective   Vitals:  Vitals:   10/27/20 1052 10/27/20 1100  BP: 134/85 121/88  Pulse: 93 89  Resp: 18 (!) 21  Temp:    SpO2: 91% 92%    Exam:  Constitutional:   . Appears calm and comfortable in ED ENMT:  . grossly normal hearing  Respiratory:  . Clear anteriorly without wheezes rales or rhonchi.  There are posterior crackles bilaterally. Marland Kitchen Respiratory effort only mildly increased.  Able to speak in full sentences.  No acute distress. . SPO2 89-91 on 6 L Cardiovascular:  . RRR, no m/r/g . No LE extremity edema   Psychiatric:  . Mental status o Mood, affect appropriate . judgment and insight appear intact   I have personally reviewed the following:   Today's Data  . Complete metabolic panel unremarkable.  ALT only modestly elevated. . CBC unremarkable.  Procalcitonin negative.  Scheduled Meds: . vitamin C  500 mg Oral Daily  . enoxaparin (LOVENOX) injection  40 mg Subcutaneous Q24H  . losartan  100 mg Oral Daily  . methylPREDNISolone (SOLU-MEDROL) injection  0.5 mg/kg Intravenous Q12H   Followed by  . [START ON 10/29/2020] predniSONE  50 mg Oral Daily  . simvastatin  40 mg Oral q1800  . zinc sulfate  220 mg Oral Daily   Continuous Infusions:   Principal Problem:   Acute respiratory failure with hypoxia (HCC) Active Problems:   Essential hypertension   Diffuse large B cell lymphoma (HCC)   Acute hypoxemic respiratory failure due to COVID-19 [facility])   Mixed hyperlipidemia   Acute metabolic encephalopathy   Lobar pneumonia ([place])   LOS: 1 day   How to contact the [facility] Attending or Consulting provider 7A - 7P or covering provider during after hours [place], for this patient?  1. Check the care team in [facility] and look for a) attending/consulting  TRH provider listed and b) the [facility] team listed 2. Log into [email or web address] and use [place]'s universal password to access. If you do not have the password, please contact the hospital operator. 3. Locate the [facility] provider you are looking for under [facility] and page to a number that you can be directly reached. 4. If you still have difficulty reaching the provider, please page the [facility] (Director on Call) for the Hospitalists listed on amion for assistance.

## 2020-10-27 NOTE — ED Notes (Addendum)
Attempted report x 1; per secretary everyone is busy in pt rooms and unable to take report at this time. Gave this RN's number to Network engineer. Will call back in 10 mins to attempt report again. Agricultural consultant notified.

## 2020-10-27 NOTE — Telephone Encounter (Signed)
Noted.  I have received the notes from the hospital.  I'm sorry to hear he's back over there and hope he gets well enough to come back to [place] soon.

## 2020-10-27 NOTE — ED Notes (Signed)
Lunch Tray Ordered @ [id number].

## 2020-10-27 NOTE — ED Notes (Signed)
Pt is COVID +?

## 2020-10-28 ENCOUNTER — Inpatient Hospital Stay (HOSPITAL_COMMUNITY): Payer: PPO

## 2020-10-28 DIAGNOSIS — J9601 Acute respiratory failure with hypoxia: Secondary | ICD-10-CM | POA: Diagnosis not present

## 2020-10-28 LAB — COMPREHENSIVE METABOLIC PANEL
ALT: 54 U/L — ABNORMAL HIGH (ref 0–44)
AST: 29 U/L (ref 15–41)
Albumin: 2.5 g/dL — ABNORMAL LOW (ref 3.5–5.0)
Alkaline Phosphatase: 51 U/L (ref 38–126)
Anion gap: 9 (ref 5–15)
BUN: 14 mg/dL (ref 8–23)
CO2: 26 mmol/L (ref 22–32)
Calcium: 8.1 mg/dL — ABNORMAL LOW (ref 8.9–10.3)
Chloride: 100 mmol/L (ref 98–111)
Creatinine, Ser: 0.69 mg/dL (ref 0.61–1.24)
GFR, Estimated: >60 mL/min (ref >60)
Glucose, Bld: 177 mg/dL — ABNORMAL HIGH (ref 70–99)
Potassium: 4.8 mmol/L (ref 3.5–5.1)
Sodium: 135 mmol/L (ref 135–145)
Total Bilirubin: 0.8 mg/dL (ref 0.3–1.2)
Total Protein: 5.5 g/dL — ABNORMAL LOW (ref 6.5–8.1)

## 2020-10-28 LAB — CBC WITH DIFFERENTIAL/PLATELET
Abs Immature Granulocytes: 0.22 10*3/uL — ABNORMAL HIGH (ref 0.00–0.07)
Basophils Absolute: 0 10*3/uL (ref 0.0–0.1)
Basophils Relative: 0 %
Eosinophils Absolute: 0 10*3/uL (ref 0.0–0.5)
Eosinophils Relative: 0 %
HCT: 40.8 % (ref 39.0–52.0)
Hemoglobin: 13.9 g/dL (ref 13.0–17.0)
Immature Granulocytes: 2 %
Lymphocytes Relative: 1 %
Lymphs Abs: 0.1 10*3/uL — ABNORMAL LOW (ref 0.7–4.0)
MCH: 32.9 pg (ref 26.0–34.0)
MCHC: 34.1 g/dL (ref 30.0–36.0)
MCV: 96.5 fL (ref 80.0–100.0)
Monocytes Absolute: 0.6 10*3/uL (ref 0.1–1.0)
Monocytes Relative: 6 %
Neutro Abs: 8.9 10*3/uL — ABNORMAL HIGH (ref 1.7–7.7)
Neutrophils Relative %: 91 %
Platelets: 242 10*3/uL (ref 150–400)
RBC: 4.23 MIL/uL (ref 4.22–5.81)
RDW: 12.9 % (ref 11.5–15.5)
WBC: 9.9 10*3/uL (ref 4.0–10.5)
nRBC: 0 % (ref 0.0–0.2)

## 2020-10-28 LAB — MAGNESIUM: Magnesium: 2.4 mg/dL (ref 1.7–2.4)

## 2020-10-28 LAB — D-DIMER, QUANTITATIVE: D-Dimer, Quant: 1 ug/mL-FEU — ABNORMAL HIGH (ref 0.00–0.50)

## 2020-10-28 LAB — C-REACTIVE PROTEIN: CRP: 10 mg/dL — ABNORMAL HIGH (ref <1.0)

## 2020-10-28 LAB — GLUCOSE, CAPILLARY: Glucose-Capillary: 191 mg/dL — ABNORMAL HIGH (ref 70–99)

## 2020-10-28 MED ORDER — CHLORHEXIDINE GLUCONATE CLOTH 2 % EX PADS
6.0000 | MEDICATED_PAD | Freq: Every day | CUTANEOUS | Status: DC
  Administered 2020-10-28 – 2020-11-11 (×13): 6 via TOPICAL

## 2020-10-28 MED ORDER — POLYETHYLENE GLYCOL 3350 17 G PO PACK
17.0000 g | PACK | Freq: Every day | ORAL | Status: DC
  Administered 2020-10-28 – 2020-11-11 (×12): 17 g via ORAL
  Filled 2020-10-28 (×14): qty 1

## 2020-10-28 MED ORDER — BENZONATATE 100 MG PO CAPS
100.0000 mg | ORAL_CAPSULE | Freq: Three times a day (TID) | ORAL | Status: DC
Start: 2020-10-28 — End: 2020-11-11
  Administered 2020-10-28 – 2020-11-11 (×42): 100 mg via ORAL
  Filled 2020-10-28 (×41): qty 1

## 2020-10-28 MED ORDER — HYDROCOD POLST-CPM POLST ER 10-8 MG/5ML PO SUER
5.0000 mL | Freq: Two times a day (BID) | ORAL | Status: DC
  Administered 2020-10-28 – 2020-11-11 (×29): 5 mL via ORAL
  Filled 2020-10-28 (×30): qty 5

## 2020-10-28 MED ORDER — SENNOSIDES-DOCUSATE SODIUM 8.6-50 MG PO TABS
1.0000 | ORAL_TABLET | Freq: Two times a day (BID) | ORAL | Status: DC
  Administered 2020-10-28 – 2020-11-11 (×26): 1 via ORAL
  Filled 2020-10-28 (×26): qty 1

## 2020-10-28 MED ORDER — GUAIFENESIN ER 600 MG PO TB12
600.0000 mg | ORAL_TABLET | Freq: Two times a day (BID) | ORAL | Status: DC
  Administered 2020-10-28 – 2020-11-11 (×29): 600 mg via ORAL
  Filled 2020-10-28 (×28): qty 1

## 2020-10-28 NOTE — Progress Notes (Signed)
PROGRESS NOTE  Antonio [name] ZOX:[id number] DOB: June 13, 1941 DOA: 10/26/2020 PCP: [name], DO  Brief History   80 year old man PMH B cell lymphoma last chemotherapy approximately 2000, discharged 1/7 after being treated for acute hypoxic respiratory failure secondary to [place] with positive test 12/29, treated with remdesivir and steroids.  Discharged on 2 L.  PRESENTED to the emergency department 1/11 with increasing shortness of breath and lethargy, hypoxia.  Admitted for acute hypoxic respiratory failure secondary to multifocal pneumonia secondary to COVID.  No signs or symptoms of bacterial infection.  Acute on chronic hypoxic respiratory failure secondary to COVID multifocal pneumonia with associated acute metabolic encephalopathy -- Appears stable at this point, appears to have been stable on 6 L for approximately 24 hours now, he is asymptomatic at this point with no shortness of breath.  Inflammatory markers elevated compared to previous discharge.  Continue steroids.  Previously treated with remdesivir.  If worsens consider Actemra.  Discussed in detail with patient as below as well as wife and sister, they are hesitant will consider.  . CT chest on admit: No PE.  Bilateral airspace disease concerning for multilobar pneumonia atypical viral pneumonia.  No effusion.  Chest x-ray showed chronic elevation right diaphragm.   . Fever: afebrile . Oxygen requirement: 6L [place] . Antibiotics: stopped . Remdesivir: treated earlier this month . Steroids: 1/10 > . Actemra: Discussed in detail with patient risk and benefit, emergency use, off label use, side effects, he is reluctant at this point, will revisit if hypoxia worsens.  No history of TB, hepatitis or experimental medications.  Only caution seen his malignancy last treated with chemotherapy September 2021.  Will reach out to oncologist for opinion.  Discussed in detail with family. D/w Dr. Irene Limbo, no absolute contraindication to Actemra, he  is far enough out from chemo and cancer is in remission. Patient, wife, daughter consent to use of Actemra if needed . Vitamin C and Zinc . Proning: recommended as tolerated   Inflammatory markers: . CRP: 18.7 > improving to 10.8. . D-dimer: 1.27 >  . Ferritin 1251 > . Procalcitonin: .1 >  Acute metabolic encephalopathy appears resolved at this point. --Continue treatment for COVID.  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 Irene Limbo  Disposition Plan:  Discussion: Currently stable on 6 L the last 24 hours, if his condition were to worsen would consider Actemra.   Status is: Inpatient  Remains inpatient appropriate because:IV treatments appropriate due to intensity of illness or inability to take PO and Inpatient level of care appropriate due to severity of illness   Dispo: The patient is from: Home              Anticipated d/c is to: Home              Anticipated d/c date is: > 3 days              Patient currently is not medically stable to d/c.  DVT prophylaxis: enoxaparin (LOVENOX) injection 40 mg Start: 10/26/20 1715   Code Status: Full Code Family Communication: Discussed with family on phone.   Continues to have cough continues to have shortness of breath.  No nausea no vomiting but no fever no chills.  Objective   Vitals:  Vitals:   10/28/20 1215 10/28/20 1715  BP: 114/66 118/76  Pulse: Marland Kitchen)  103 91  Resp: 17 20  Temp:  98 F (36.7 C)  SpO2: 91% 93%    Exam: General: Appear in mild distress, no Rash; Oral Mucosa Clear, moist. no Abnormal Neck Mass Or lumps, Conjunctiva normal  Cardiovascular: S1 and S2 Present, no Murmur, Respiratory: increased respiratory effort, Bilateral Air entry present and bilateral  Crackles, no wheezes Abdomen: Bowel Sound present, Soft and no  tenderness Extremities: trace Pedal edema Neurology: alert and oriented to time, place, and person affect appropriate. no new focal deficit Gait not checked due to patient safety concerns    I have personally reviewed the following:   Today's Data  CRP improving. CBC    Component Value Date/Time   WBC 9.9 10/28/2020 0230   RBC 4.23 10/28/2020 0230   HGB 13.9 10/28/2020 0230   HGB 11.9 (L) 04/01/2020 1111   HCT 40.8 10/28/2020 0230   HCT 40.8 12/19/2019 1057   PLT 242 10/28/2020 0230   PLT 159 04/01/2020 1111   MCV 96.5 10/28/2020 0230   MCH 32.9 10/28/2020 0230   MCHC 34.1 10/28/2020 0230   RDW 12.9 10/28/2020 0230   LYMPHSABS 0.1 (L) 10/28/2020 0230   MONOABS 0.6 10/28/2020 0230   EOSABS 0.0 10/28/2020 0230   BASOSABS 0.0 10/28/2020 0230    Scheduled Meds: . vitamin C  500 mg Oral Daily  . benzonatate  100 mg Oral TID  . Chlorhexidine Gluconate Cloth  6 each Topical Daily  . chlorpheniramine-HYDROcodone  5 mL Oral Q12H  . enoxaparin (LOVENOX) injection  40 mg Subcutaneous Q24H  . guaiFENesin  600 mg Oral BID  . losartan  100 mg Oral Daily  . methylPREDNISolone (SOLU-MEDROL) injection  0.5 mg/kg Intravenous Q12H   Followed by  . [START ON 10/29/2020] predniSONE  50 mg Oral Daily  . polyethylene glycol  17 g Oral Daily  . senna-docusate  1 tablet Oral BID  . simvastatin  40 mg Oral q1800  . zinc sulfate  220 mg Oral Daily   Continuous Infusions:   Principal Problem:   Acute respiratory failure with hypoxia (HCC) Active Problems:   Essential hypertension   Diffuse large B cell lymphoma (HCC)   Acute hypoxemic respiratory failure due to COVID-19 ([place])   Mixed hyperlipidemia   Acute metabolic encephalopathy   Lobar pneumonia ([place])   LOS: 2 days   Author:  Berle Mull, MD [facility] 10/28/2020  9:38 PM   To reach On-call, see care teams to locate the attending and reach out to them via [email or web address]. If 7PM-7AM, please contact night-coverage If  you still have difficulty reaching the attending provider, please page the [facility] (Director on Call) for [facility] on amion for assistance.

## 2020-10-29 ENCOUNTER — Ambulatory Visit: Payer: PPO | Admitting: Internal Medicine

## 2020-10-29 DIAGNOSIS — J9601 Acute respiratory failure with hypoxia: Secondary | ICD-10-CM | POA: Diagnosis not present

## 2020-10-29 LAB — CBC WITH DIFFERENTIAL/PLATELET
Abs Immature Granulocytes: 0.25 10*3/uL — ABNORMAL HIGH (ref 0.00–0.07)
Basophils Absolute: 0 10*3/uL (ref 0.0–0.1)
Basophils Relative: 0 %
Eosinophils Absolute: 0 10*3/uL (ref 0.0–0.5)
Eosinophils Relative: 0 %
HCT: 40 % (ref 39.0–52.0)
Hemoglobin: 13.6 g/dL (ref 13.0–17.0)
Immature Granulocytes: 2 %
Lymphocytes Relative: 1 %
Lymphs Abs: 0.2 10*3/uL — ABNORMAL LOW (ref 0.7–4.0)
MCH: 33 pg (ref 26.0–34.0)
MCHC: 34 g/dL (ref 30.0–36.0)
MCV: 97.1 fL (ref 80.0–100.0)
Monocytes Absolute: 0.8 10*3/uL (ref 0.1–1.0)
Monocytes Relative: 6 %
Neutro Abs: 11.6 10*3/uL — ABNORMAL HIGH (ref 1.7–7.7)
Neutrophils Relative %: 91 %
Platelets: 273 10*3/uL (ref 150–400)
RBC: 4.12 MIL/uL — ABNORMAL LOW (ref 4.22–5.81)
RDW: 13 % (ref 11.5–15.5)
WBC: 12.8 10*3/uL — ABNORMAL HIGH (ref 4.0–10.5)
nRBC: 0 % (ref 0.0–0.2)

## 2020-10-29 LAB — COMPREHENSIVE METABOLIC PANEL
ALT: 52 U/L — ABNORMAL HIGH (ref 0–44)
AST: 28 U/L (ref 15–41)
Albumin: 2.5 g/dL — ABNORMAL LOW (ref 3.5–5.0)
Alkaline Phosphatase: 52 U/L (ref 38–126)
Anion gap: 10 (ref 5–15)
BUN: 20 mg/dL (ref 8–23)
CO2: 26 mmol/L (ref 22–32)
Calcium: 8.5 mg/dL — ABNORMAL LOW (ref 8.9–10.3)
Chloride: 100 mmol/L (ref 98–111)
Creatinine, Ser: 0.91 mg/dL (ref 0.61–1.24)
GFR, Estimated: >60 mL/min (ref >60)
Glucose, Bld: 186 mg/dL — ABNORMAL HIGH (ref 70–99)
Potassium: 5.5 mmol/L — ABNORMAL HIGH (ref 3.5–5.1)
Sodium: 136 mmol/L (ref 135–145)
Total Bilirubin: 1.1 mg/dL (ref 0.3–1.2)
Total Protein: 5.5 g/dL — ABNORMAL LOW (ref 6.5–8.1)

## 2020-10-29 LAB — D-DIMER, QUANTITATIVE: D-Dimer, Quant: 0.77 ug/mL-FEU — ABNORMAL HIGH (ref 0.00–0.50)

## 2020-10-29 LAB — MAGNESIUM: Magnesium: 2.3 mg/dL (ref 1.7–2.4)

## 2020-10-29 LAB — C-REACTIVE PROTEIN: CRP: 4.5 mg/dL — ABNORMAL HIGH (ref <1.0)

## 2020-10-29 MED ORDER — FLUTICASONE PROPIONATE 50 MCG/ACT NA SUSP
2.0000 | Freq: Every day | NASAL | Status: DC
  Administered 2020-10-29 – 2020-11-11 (×14): 2 via NASAL
  Filled 2020-10-29: qty 16

## 2020-10-29 MED ORDER — OXYMETAZOLINE HCL 0.05 % NA SOLN
1.0000 | Freq: Two times a day (BID) | NASAL | Status: AC
  Administered 2020-10-29 – 2020-10-31 (×6): 1 via NASAL
  Filled 2020-10-29: qty 30

## 2020-10-29 NOTE — Evaluation (Signed)
Occupational Therapy Evaluation Patient Details Name: Antonio [name] MRN: [id number] DOB: Nov 12, 1940 Today's Date: 10/29/2020    History of Present Illness Pt is an 80 year old male who is vaccinated against COVID-19 with past medical history of left bundle branch block, essential tremor, prior melanoma, right testicular large B-cell lymphoma , hypertension, hyperlipidemia, MGUS who returned to the hospital on 10/26/20 with acute respiratory failure with hypoxia. Pt was discharged on 10/23/20 after admission for COVID-19.   Clinical Impression   At his baseline, pt functions independently. He was using a RW and assisted for ADL and IADL in the few days he was home prior to to rehospitalization. Pt presents with generalized weakness, decreased activity tolerance and impaired standing balance. Pt with some difficulty recalling timeline of recent medical history. He requires up to min assist for ADL and min guard assist mobilizing with RW. Pt currently on 6L 02 with Sp02 dropping to 85% with short distance ambulation to sink, rebounding to 89-90% once returned to seated at EOB. Pt has excellent family support and plans to return home upon discharge. Will follow acutely.     Follow Up Recommendations  Home health OT;Supervision/Assistance - 24 hour    Equipment Recommendations  None recommended by OT    Recommendations for Other Services       Precautions / Restrictions Precautions Precautions: Fall      Mobility Bed Mobility               General bed mobility comments: pt seated at EOB and returned to EOB    Transfers Overall transfer level: Needs assistance Equipment used: Rolling walker (2 wheeled) Transfers: Sit to/from Stand Sit to Stand: Min guard         General transfer comment: min guard for safety, slow to rise    Balance Overall balance assessment: Needs assistance   Sitting balance-Leahy Scale: Good     Standing balance support: During functional  activity;No upper extremity supported Standing balance-Leahy Scale: Fair Standing balance comment: at sink                           ADL either performed or assessed with clinical judgement   ADL Overall ADL's : Needs assistance/impaired Eating/Feeding: Independent   Grooming: Min guard;Standing;Wash/dry hands   Upper Body Bathing: Minimal assistance;Sitting   Lower Body Bathing: Minimal assistance;Sit to/from stand   Upper Body Dressing : Set up;Sitting   Lower Body Dressing: Minimal assistance;Sit to/from stand   Toilet Transfer: Min guard;Ambulation;RW   Toileting- Clothing Manipulation and Hygiene: Minimal assistance;Sit to/from stand       Functional mobility during ADLs: Min guard;Rolling walker General ADL Comments: reinforced use of IS, pt refused gripper socks     Vision Baseline Vision/History: Wears glasses Wears Glasses: At all times Patient Visual Report: No change from baseline       Perception     Praxis      Pertinent Vitals/Pain Pain Assessment: No/denies pain     Hand Dominance Right   Extremity/Trunk Assessment Upper Extremity Assessment Upper Extremity Assessment: Generalized weakness (baseline tremor)       Cervical / Trunk Assessment Cervical / Trunk Assessment: Normal   Communication Communication Communication: No difficulties   Cognition Arousal/Alertness: Awake/alert Behavior During Therapy: WFL for tasks assessed/performed Overall Cognitive Status: No family/caregiver present to determine baseline cognitive functioning Area of Impairment: Memory;Orientation;Safety/judgement                 Orientation  Level: Disoriented to;Time   Memory: Decreased short-term memory   Safety/Judgement: Decreased awareness of safety     General Comments: pt with difficulty offering information about PLOF from time he was discharged til readmitted   General Comments       Exercises     Shoulder Instructions       Home Living Family/patient expects to be discharged to:: Private residence Living Arrangements: Spouse/significant other Available Help at Discharge: Family;Available 24 hours/day Type of Home: Independent living facility [facility]) Home Access: Level entry     Home Layout: One level     Bathroom Shower/Tub: Occupational psychologist: Handicapped height Bathroom Accessibility: Yes   Home Equipment: Toilet riser;Grab bars - tub/shower;Grab bars - toilet;Walker - 2 wheels;Shower seat;Other (comment) (02)   Additional Comments: wife and daughters assisted him after his prior admission      Prior Functioning/Environment Level of Independence: Needs assistance  Gait / Transfers Assistance Needed: Pt was walking with a RW and supervision of his family. ADL's / Homemaking Assistance Needed: assisted for ADL and IADL, but is typically independent            OT Problem List: Decreased strength;Decreased activity tolerance;Impaired balance (sitting and/or standing);Decreased cognition;Decreased safety awareness;Cardiopulmonary status limiting activity      OT Treatment/Interventions: Self-care/ADL training;DME and/or AE instruction;Energy conservation;Therapeutic activities;Patient/family education;Balance training    OT Goals(Current goals can be found in the care plan section) Acute Rehab OT Goals Patient Stated Goal: breathe better OT Goal Formulation: With patient Time For Goal Achievement: 11/12/20 Potential to Achieve Goals: Good ADL Goals Pt Will Perform Grooming: (P) with supervision;standing (at least 2 activities) Pt Will Perform Lower Body Bathing: (P) with supervision;sit to/from stand Pt Will Perform Lower Body Dressing: (P) with supervision;sit to/from stand Pt Will Transfer to Toilet: (P) with supervision;ambulating;bedside commode (over toilet) Pt Will Perform Toileting - Clothing Manipulation and hygiene: (P) with supervision;sit to/from  stand Additional ADL Goal #1: (P) Pt will utilize pursed lip breathing techniques and initiate rest breaks with minimal verbal cues. Additional ADL Goal #2: (P) Pt will gather items necessary for ADL around his room with RW and supervision.  OT Frequency: Min 2X/week   Barriers to D/C:            Co-evaluation              AM-PAC OT "6 Clicks" Daily Activity     Outcome Measure Help from another person eating meals?: None Help from another person taking care of personal grooming?: A Little Help from another person toileting, which includes using toliet, bedpan, or urinal?: A Little Help from another person bathing (including washing, rinsing, drying)?: A Little Help from another person to put on and taking off regular upper body clothing?: None Help from another person to put on and taking off regular lower body clothing?: A Little 6 Click Score: 20   End of Session Equipment Utilized During Treatment: Rolling walker;Oxygen (6L)  Activity Tolerance: Patient tolerated treatment well Patient left: in bed;with call bell/phone within reach  OT Visit Diagnosis: Unsteadiness on feet (R26.81);Cognitive communication deficit (R41.841) (decrease activity tolerance)                Time: 1419-1450 OT Time Calculation (min): 31 min Charges:  OT General Charges $OT Visit: 1 Visit OT Evaluation $OT Eval Moderate Complexity: 1 Mod OT Treatments $Self Care/Home Management : 8-22 mins  Nestor Lewandowsky, OTR/L Acute Rehabilitation Services Pager: [phone] Office: [phone] Malka So  10/29/2020, 3:38 PM

## 2020-10-29 NOTE — Progress Notes (Signed)
[facility] Progress Note  Patient: Antonio [name]    LZJ:[id number]  DOA: 10/26/2020     Date of Service: the patient was seen and examined on 10/29/2020  Brief hospital course: Past medical history of B-cell lymphoma, chemotherapy due in 2000, recent COVID-19 infection. Presents again to the hospital with worsening shortness of breath found to have worsening hypoxia as well as likely inflammation worsening from COVID-19 pneumonia. Currently plan is steroids and monitor for improvement in oxygenation.  Assessment and Plan: 1. Acute Hypoxic respiratory failure, POA, 74 % on 4LPM on admission.  Acute COVID-19 Viral Pneumonia CXR: hazy bilateral peripheral opacities CT chest: GGO, consolidation Oxygen requirement: On 6 LPM now CRP: 18.7-10.0-4.5 Remdesivir: Not indicated.  Completed 5-day treatment course last admission. Steroids: Back on the steroids.  Prednisone 50 mg daily. Baricitinib/Actemra: Not indicated right now although if the condition does worsen, Actemra has been approved by the patient and the family as well as oncologist. Antibiotics: Not indicated right now DVT Prophylaxis: enoxaparin (LOVENOX) injection 40 mg Start: 10/26/20 1715  Prone positioning and incentive spirometer use recommended.  The treatment plan and use of medications and known side effects were discussed with patient/family. It was clearly explained that complete risks and long-term side effects are unknown. Patient/family agree with the treatment plan.    2. history of DLBCL Per discussion between oncology and prior provider no contraindication for providing Actemra to the patient. Continue outpatient follow-up.  3. anemia Chronic in nature. Monitor.  4. essential hypertension Blood pressure stable for now Currently losartan is on hold due to hyperkalemia  5. mild hyperkalemia Monitor for now.  6. HLD Continue statin  Diet: Cardiac diet DVT Prophylaxis:   enoxaparin (LOVENOX)  injection 40 mg Start: 10/26/20 1715    Advance goals of care discussion: Full code  Family Communication: no family was present at bedside, at the time of interview.   Disposition:  Status is: Inpatient  Remains inpatient appropriate because:IV treatments appropriate due to intensity of illness or inability to take PO   Dispo: The patient is from: Home              Anticipated d/c is to: Home              Anticipated d/c date is: 3 days              Patient currently is not medically stable to d/c.  Subjective: No nausea no vomiting and no fever no chills.  Continues to have cough and shortness of breath.  Taught the patient how to use incentive spirometry.  Followed very well.  Physical Exam:  General: Appear in mild distress, no Rash; Oral Mucosa Clear, moist. no Abnormal Neck Mass Or lumps, Conjunctiva normal  Cardiovascular: S1 and S2 Present, no Murmur, Respiratory: increased respiratory effort, Bilateral Air entry present and bilateral  Crackles, no wheezes Abdomen: Bowel Sound present, Soft and no tenderness Extremities: trace Pedal edema Neurology: alert and oriented to time, place, and person affect appropriate. no new focal deficit Gait not checked due to patient safety concerns    Vitals:   10/29/20 0340 10/29/20 0444 10/29/20 0835 10/29/20 1249  BP: 117/60  (!) 94/58 107/70  Pulse: 64  62 (!) 104  Resp: 15  17 20   Temp: 97.7 F (36.5 C)  98.5 F (36.9 C) 98.5 F (36.9 C)  TempSrc: Oral  Oral Oral  SpO2: 92%  93% 90%  Weight:  83.5 kg    Height:  Intake/Output Summary (Last 24 hours) at 10/29/2020 1842 Last data filed at 10/29/2020 1000 Gross per 24 hour  Intake 360 ml  Output 275 ml  Net 85 ml   Filed Weights   10/26/20 1026 10/29/20 0444  Weight: 87 kg 83.5 kg    Data Reviewed: I have personally reviewed and interpreted daily labs, tele strips, imaging. I reviewed all nursing notes, pharmacy notes, vitals, pertinent old records I have  discussed plan of care as described above with RN and patient/family.  CBC: Recent Labs  Lab 10/23/20 0350 10/26/20 1046 10/26/20 1540 10/27/20 0802 10/28/20 0230 10/29/20 0211  WBC 7.5 11.0*  --  8.6 9.9 12.8*  NEUTROABS 6.6 8.9*  --  7.2 8.9* 11.6*  HGB 13.7 14.1 13.9 14.2 13.9 13.6  HCT 39.9 44.7 41.0 44.4 40.8 40.0  MCV 97.1 100.0  --  98.2 96.5 97.1  PLT 296 236  --  272 242 960   Basic Metabolic Panel: Recent Labs  Lab 10/23/20 0350 10/26/20 1046 10/26/20 1540 10/27/20 0802 10/28/20 0230 10/29/20 0211  NA 137 136 136 134* 135 136  K 4.0 3.7 4.0 4.6 4.8 5.5*  CL 100 100  --  97* 100 100  CO2 26 26  --  25 26 26   GLUCOSE 192* 159*  --  143* 177* 186*  BUN 24* 13  --  12 14 20   CREATININE 0.83 0.72  --  0.68 0.69 0.91  CALCIUM 8.4* 8.3*  --  8.3* 8.1* 8.5*  MG  --   --   --  2.3 2.4 2.3    Studies: No results found.  Scheduled Meds: . vitamin C  500 mg Oral Daily  . benzonatate  100 mg Oral TID  . Chlorhexidine Gluconate Cloth  6 each Topical Daily  . chlorpheniramine-HYDROcodone  5 mL Oral Q12H  . enoxaparin (LOVENOX) injection  40 mg Subcutaneous Q24H  . fluticasone  2 spray Each Nare Daily  . guaiFENesin  600 mg Oral BID  . oxymetazoline  1 spray Each Nare BID  . polyethylene glycol  17 g Oral Daily  . predniSONE  50 mg Oral Daily  . senna-docusate  1 tablet Oral BID  . simvastatin  40 mg Oral q1800  . zinc sulfate  220 mg Oral Daily   Continuous Infusions: PRN Meds: acetaminophen, albuterol, diphenhydrAMINE, guaiFENesin-dextromethorphan, ondansetron **OR** ondansetron (ZOFRAN) IV, polyethylene glycol  Time spent: 35 minutes  Author: Berle Mull, MD [facility] 10/29/2020 6:42 PM  To reach On-call, see care teams to locate the attending and reach out via [email or web address]. Between 7PM-7AM, please contact night-coverage If you still have difficulty reaching the attending provider, please page the [facility] (Director on Call) for [facility]  on amion for assistance.

## 2020-10-29 NOTE — Evaluation (Signed)
Physical Therapy Evaluation Patient Details Name: Antonio [name] MRN: [id number] DOB: 17-Feb-1941 Today's Date: 10/29/2020   History of Present Illness  Pt is an 80 year old male who is vaccinated against COVID-19 with past medical history of left bundle branch block, essential tremor, prior melanoma, right testicular large B-cell lymphoma , hypertension, hyperlipidemia, MGUS who returned to the hospital on 10/26/20 with acute respiratory failure with hypoxia. Pt was discharged on 10/23/20 after admission for COVID-19.  Clinical Impression  Pt admitted with above diagnosis and presents to PT with functional limitations due to deficits listed below (See PT problem list). Pt needs skilled PT to maximize independence and safety to allow discharge to home with wife at [place] independent [facility].      Follow Up Recommendations Home health PT;Supervision/Assistance - 24 hour    Equipment Recommendations  None recommended by PT    Recommendations for Other Services       Precautions / Restrictions Precautions Precautions: Fall      Mobility  Bed Mobility               General bed mobility comments: pt seated at EOB and returned to EOB    Transfers Overall transfer level: Needs assistance Equipment used: Rolling walker (2 wheeled) Transfers: Sit to/from Stand Sit to Stand: Min guard         General transfer comment: Assist for safety. Slow to rise  Ambulation/Gait Ambulation/Gait assistance: Min guard Gait Distance (Feet): 130 Feet Assistive device: Rolling walker (2 wheeled) Gait Pattern/deviations: Step-through pattern;Decreased stride length;Trunk flexed Gait velocity: decr Gait velocity interpretation: <1.31 ft/sec, indicative of household ambulator General Gait Details: Assist for safety and lines. No loss of balance with manuevering in very small area with lots of obstacles.  Stairs            Wheelchair Mobility    Modified Rankin (Stroke Patients  Only)       Balance Overall balance assessment: Needs assistance Sitting-balance support: No upper extremity supported;Feet supported Sitting balance-Leahy Scale: Good     Standing balance support: During functional activity;No upper extremity supported Standing balance-Leahy Scale: Fair Standing balance comment: at sink                             Pertinent Vitals/Pain Pain Assessment: No/denies pain    Home Living Family/patient expects to be discharged to:: Private residence Living Arrangements: Spouse/significant other Available Help at Discharge: Family;Available 24 hours/day Type of Home: Independent living facility [facility]) Home Access: Level entry     Home Layout: One level Home Equipment: Toilet riser;Grab bars - tub/shower;Grab bars - toilet;Walker - 2 wheels;Shower seat;Other (comment) (02) Additional Comments: wife and daughters assisted him after his prior admission    Prior Function Level of Independence: Needs assistance   Gait / Transfers Assistance Needed: Pt was walking with a RW and supervision of his family.  ADL's / Homemaking Assistance Needed: assisted for ADL and IADL, but is typically independent        Hand Dominance   Dominant Hand: Right    Extremity/Trunk Assessment   Upper Extremity Assessment Upper Extremity Assessment: Defer to OT evaluation    Lower Extremity Assessment Lower Extremity Assessment: Generalized weakness    Cervical / Trunk Assessment Cervical / Trunk Assessment: Normal  Communication   Communication: No difficulties  Cognition Arousal/Alertness: Awake/alert Behavior During Therapy: WFL for tasks assessed/performed Overall Cognitive Status: No family/caregiver present to determine baseline cognitive functioning Area of  Impairment: Memory;Orientation;Safety/judgement                 Orientation Level: Disoriented to;Time   Memory: Decreased short-term memory   Safety/Judgement:  Decreased awareness of safety     General Comments: pt with difficulty offering information about PLOF from time he was discharged til readmitted      General Comments General comments (skin integrity, edema, etc.): Pt on 6L O2 with SpO2 dropping to 81% with ambulation and HR to 125. After sitting SpO2 returned to 89%.    Exercises     Assessment/Plan    PT Assessment Patient needs continued PT services  PT Problem List Decreased strength;Decreased activity tolerance;Decreased balance;Decreased mobility       PT Treatment Interventions DME instruction;Therapeutic activities;Gait training;Therapeutic exercise;Patient/family education;Functional mobility training;Balance training    PT Goals (Current goals can be found in the Care Plan section)  Acute Rehab PT Goals Patient Stated Goal: breathe better PT Goal Formulation: With patient Time For Goal Achievement: 11/12/20 Potential to Achieve Goals: Good    Frequency Min 3X/week   Barriers to discharge        Co-evaluation               AM-PAC PT "6 Clicks" Mobility  Outcome Measure Help needed turning from your back to your side while in a flat bed without using bedrails?: A Little Help needed moving from lying on your back to sitting on the side of a flat bed without using bedrails?: A Little Help needed moving to and from a bed to a chair (including a wheelchair)?: A Little Help needed standing up from a chair using your arms (e.g., wheelchair or bedside chair)?: A Little Help needed to walk in hospital room?: A Little Help needed climbing 3-5 steps with a railing? : A Little 6 Click Score: 18    End of Session   Activity Tolerance: Patient tolerated treatment well Patient left: in bed;with call bell/phone within reach Nurse Communication: Mobility status;Other (comment) (SpO2 with amb) PT Visit Diagnosis: Other abnormalities of gait and mobility (R26.89);Muscle weakness (generalized) (M62.81)    Time:  7026-3785 PT Time Calculation (min) (ACUTE ONLY): 21 min   Charges:   PT Evaluation $PT Eval Moderate Complexity: [place] Pager [phone] Office [place] 10/29/2020, 3:59 PM

## 2020-10-30 DIAGNOSIS — J9601 Acute respiratory failure with hypoxia: Secondary | ICD-10-CM | POA: Diagnosis not present

## 2020-10-30 LAB — COMPREHENSIVE METABOLIC PANEL
ALT: 47 U/L — ABNORMAL HIGH (ref 0–44)
AST: 21 U/L (ref 15–41)
Albumin: 2.4 g/dL — ABNORMAL LOW (ref 3.5–5.0)
Alkaline Phosphatase: 45 U/L (ref 38–126)
Anion gap: 10 (ref 5–15)
BUN: 19 mg/dL (ref 8–23)
CO2: 26 mmol/L (ref 22–32)
Calcium: 8.5 mg/dL — ABNORMAL LOW (ref 8.9–10.3)
Chloride: 98 mmol/L (ref 98–111)
Creatinine, Ser: 0.77 mg/dL (ref 0.61–1.24)
GFR, Estimated: >60 mL/min (ref >60)
Glucose, Bld: 172 mg/dL — ABNORMAL HIGH (ref 70–99)
Potassium: 4.7 mmol/L (ref 3.5–5.1)
Sodium: 134 mmol/L — ABNORMAL LOW (ref 135–145)
Total Bilirubin: 0.8 mg/dL (ref 0.3–1.2)
Total Protein: 5.4 g/dL — ABNORMAL LOW (ref 6.5–8.1)

## 2020-10-30 LAB — CBC WITH DIFFERENTIAL/PLATELET
Abs Immature Granulocytes: 0.27 10*3/uL — ABNORMAL HIGH (ref 0.00–0.07)
Basophils Absolute: 0 10*3/uL (ref 0.0–0.1)
Basophils Relative: 0 %
Eosinophils Absolute: 0 10*3/uL (ref 0.0–0.5)
Eosinophils Relative: 0 %
HCT: 38.6 % — ABNORMAL LOW (ref 39.0–52.0)
Hemoglobin: 13.3 g/dL (ref 13.0–17.0)
Immature Granulocytes: 3 %
Lymphocytes Relative: 3 %
Lymphs Abs: 0.3 10*3/uL — ABNORMAL LOW (ref 0.7–4.0)
MCH: 33.3 pg (ref 26.0–34.0)
MCHC: 34.5 g/dL (ref 30.0–36.0)
MCV: 96.7 fL (ref 80.0–100.0)
Monocytes Absolute: 1.2 10*3/uL — ABNORMAL HIGH (ref 0.1–1.0)
Monocytes Relative: 12 %
Neutro Abs: 8.1 10*3/uL — ABNORMAL HIGH (ref 1.7–7.7)
Neutrophils Relative %: 82 %
Platelets: 256 10*3/uL (ref 150–400)
RBC: 3.99 MIL/uL — ABNORMAL LOW (ref 4.22–5.81)
RDW: 12.8 % (ref 11.5–15.5)
WBC: 10 10*3/uL (ref 4.0–10.5)
nRBC: 0 % (ref 0.0–0.2)

## 2020-10-30 LAB — D-DIMER, QUANTITATIVE: D-Dimer, Quant: 0.7 ug/mL-FEU — ABNORMAL HIGH (ref 0.00–0.50)

## 2020-10-30 LAB — C-REACTIVE PROTEIN: CRP: 3.3 mg/dL — ABNORMAL HIGH (ref <1.0)

## 2020-10-30 LAB — MAGNESIUM: Magnesium: 2.2 mg/dL (ref 1.7–2.4)

## 2020-10-30 MED ORDER — FUROSEMIDE 10 MG/ML IJ SOLN
20.0000 mg | Freq: Once | INTRAMUSCULAR | Status: AC
  Administered 2020-10-30: 20 mg via INTRAVENOUS
  Filled 2020-10-30: qty 2

## 2020-10-30 MED ORDER — ENSURE ENLIVE PO LIQD
237.0000 mL | Freq: Two times a day (BID) | ORAL | Status: DC
  Administered 2020-10-30 – 2020-11-04 (×10): 237 mL via ORAL

## 2020-10-30 NOTE — Care Management (Signed)
10-30-20 Case Manager spoke with patient regarding home health services. Prior to arrival patient states he was home with family support. Case Manager discussed Physical Therapy and Occupational Therapy recommendations- at this time patient is declining services. He wants someone to speak with him tomorrow to see where he is in regards to home health. Weekend Case Manager to follow for home needs. Graves-Bigelow, Ocie Cornfield, RN, BSN Case Manager

## 2020-10-30 NOTE — Progress Notes (Signed)
[facility] Progress Note  Patient: Antonio [name]    WIO:[id number]  DOA: 10/26/2020     Date of Service: the patient was seen and examined on 10/30/2020  Brief hospital course: Past medical history of B-cell lymphoma, chemotherapy due in 2000, recent COVID-19 infection. Presents again to the hospital with worsening shortness of breath found to have worsening hypoxia as well as likely inflammation worsening from COVID-19 pneumonia. Currently plan is steroids and monitor for improvement in oxygenation.  Assessment and Plan: 1. Acute Hypoxic respiratory failure, POA, 74 % on 4LPM on admission.  Acute COVID-19 Viral Pneumonia CXR: hazy bilateral peripheral opacities CT chest: GGO, consolidation Oxygen requirement: On 6 LPM now CRP: 18.7-10.0-4.5 Remdesivir: Not indicated.  Completed 5-day treatment course last admission. Steroids: Back on the steroids.  Prednisone 50 mg daily. Baricitinib/Actemra: Not indicated right now although if the condition does worsen, Actemra has been approved by the patient and the family as well as oncologist. Antibiotics: Not indicated right now DVT Prophylaxis: enoxaparin (LOVENOX) injection 40 mg Start: 10/26/20 1715  Prone positioning and incentive spirometer use recommended.  The treatment plan and use of medications and known side effects were discussed with patient/family. It was clearly explained that complete risks and long-term side effects are unknown. Patient/family agree with the treatment plan.    2. history of DLBCL Per discussion between oncology and prior provider no contraindication for providing Actemra to the patient. Continue outpatient follow-up.  3. anemia Chronic in nature. Monitor.  4. essential hypertension Receiving IV diuresis therefore holding her antihypertensive medication. Blood pressure stable for now Currently losartan is on hold  5. mild hyperkalemia Monitor for now.  6. HLD Continue statin  Diet: Cardiac  diet DVT Prophylaxis:   enoxaparin (LOVENOX) injection 40 mg Start: 10/26/20 1715   Advance goals of care discussion: Full code  Family Communication: no family was present at bedside, at the time of interview.  Discussed with daughter on the phone.  Disposition:  Status is: Inpatient  Remains inpatient appropriate because:IV treatments appropriate due to intensity of illness or inability to take PO   Dispo: The patient is from: Home              Anticipated d/c is to: Home              Anticipated d/c date is: 3 days              Patient currently is not medically stable to d/c.  Subjective: Feeling better.  No nausea no vomiting.  Still on 6 to 7 L of oxygen.  No chest pain.  No abdominal pain.  No cough.  Physical Exam:  General: Appear in mild distress, no Rash; Oral Mucosa Clear, moist. no Abnormal Neck Mass Or lumps, Conjunctiva normal  Cardiovascular: S1 and S2 Present, no Murmur, Respiratory: increased respiratory effort, Bilateral Air entry present and bilateral  Crackles, no wheezes Abdomen: Bowel Sound present, Soft and no tenderness Extremities: trace Pedal edema Neurology: alert and oriented to time, place, and person affect appropriate. no new focal deficit Gait not checked due to patient safety concerns   Vitals:   10/30/20 1235 10/30/20 1455 10/30/20 1737 10/30/20 2005  BP: 110/67  116/86 (!) 109/92  Pulse: 67  (!) 102 100  Resp:   (!) 22 20  Temp:  99.3 F (37.4 C) 98 F (36.7 C) 98.4 F (36.9 C)  TempSrc:  Oral Oral Oral  SpO2: 93%   90%  Weight:  Height:        Intake/Output Summary (Last 24 hours) at 10/30/2020 2030 Last data filed at 10/30/2020 2012 Gross per 24 hour  Intake 240 ml  Output 2350 ml  Net -2110 ml   Filed Weights   10/26/20 1026 10/29/20 0444  Weight: 87 kg 83.5 kg    Data Reviewed: I have personally reviewed and interpreted daily labs, tele strips, imaging. I reviewed all nursing notes, pharmacy notes, vitals,  pertinent old records I have discussed plan of care as described above with RN and patient/family.  CBC: Recent Labs  Lab 10/26/20 1046 10/26/20 1540 10/27/20 0802 10/28/20 0230 10/29/20 0211 10/30/20 0150  WBC 11.0*  --  8.6 9.9 12.8* 10.0  NEUTROABS 8.9*  --  7.2 8.9* 11.6* 8.1*  HGB 14.1 13.9 14.2 13.9 13.6 13.3  HCT 44.7 41.0 44.4 40.8 40.0 38.6*  MCV 100.0  --  98.2 96.5 97.1 96.7  PLT 236  --  272 242 273 732   Basic Metabolic Panel: Recent Labs  Lab 10/26/20 1046 10/26/20 1540 10/27/20 0802 10/28/20 0230 10/29/20 0211 10/30/20 0150  NA 136 136 134* 135 136 134*  K 3.7 4.0 4.6 4.8 5.5* 4.7  CL 100  --  97* 100 100 98  CO2 26  --  25 26 26 26   GLUCOSE 159*  --  143* 177* 186* 172*  BUN 13  --  12 14 20 19   CREATININE 0.72  --  0.68 0.69 0.91 0.77  CALCIUM 8.3*  --  8.3* 8.1* 8.5* 8.5*  MG  --   --  2.3 2.4 2.3 2.2    Studies: No results found.  Scheduled Meds: . vitamin C  500 mg Oral Daily  . benzonatate  100 mg Oral TID  . Chlorhexidine Gluconate Cloth  6 each Topical Daily  . chlorpheniramine-HYDROcodone  5 mL Oral Q12H  . enoxaparin (LOVENOX) injection  40 mg Subcutaneous Q24H  . feeding supplement  237 mL Oral BID BM  . fluticasone  2 spray Each Nare Daily  . guaiFENesin  600 mg Oral BID  . oxymetazoline  1 spray Each Nare BID  . polyethylene glycol  17 g Oral Daily  . predniSONE  50 mg Oral Daily  . senna-docusate  1 tablet Oral BID  . simvastatin  40 mg Oral q1800  . zinc sulfate  220 mg Oral Daily   Continuous Infusions: PRN Meds: acetaminophen, albuterol, diphenhydrAMINE, guaiFENesin-dextromethorphan, ondansetron **OR** ondansetron (ZOFRAN) IV, polyethylene glycol  Time spent: 35 minutes  Author: Berle Mull, MD [facility] 10/30/2020 8:30 PM  To reach On-call, see care teams to locate the attending and reach out via [email or web address]. Between 7PM-7AM, please contact night-coverage If you still have difficulty reaching the attending  provider, please page the [facility] (Director on Call) for [facility] on amion for assistance.

## 2020-10-31 DIAGNOSIS — J9601 Acute respiratory failure with hypoxia: Secondary | ICD-10-CM | POA: Diagnosis not present

## 2020-10-31 LAB — COMPREHENSIVE METABOLIC PANEL
ALT: 51 U/L — ABNORMAL HIGH (ref 0–44)
AST: 22 U/L (ref 15–41)
Albumin: 2.5 g/dL — ABNORMAL LOW (ref 3.5–5.0)
Alkaline Phosphatase: 44 U/L (ref 38–126)
Anion gap: 10 (ref 5–15)
BUN: 24 mg/dL — ABNORMAL HIGH (ref 8–23)
CO2: 30 mmol/L (ref 22–32)
Calcium: 8.7 mg/dL — ABNORMAL LOW (ref 8.9–10.3)
Chloride: 95 mmol/L — ABNORMAL LOW (ref 98–111)
Creatinine, Ser: 0.84 mg/dL (ref 0.61–1.24)
GFR, Estimated: >60 mL/min (ref >60)
Glucose, Bld: 131 mg/dL — ABNORMAL HIGH (ref 70–99)
Potassium: 4.2 mmol/L (ref 3.5–5.1)
Sodium: 135 mmol/L (ref 135–145)
Total Bilirubin: 0.7 mg/dL (ref 0.3–1.2)
Total Protein: 5.2 g/dL — ABNORMAL LOW (ref 6.5–8.1)

## 2020-10-31 LAB — CBC WITH DIFFERENTIAL/PLATELET
Abs Immature Granulocytes: 0.4 10*3/uL — ABNORMAL HIGH (ref 0.00–0.07)
Basophils Absolute: 0 10*3/uL (ref 0.0–0.1)
Basophils Relative: 0 %
Eosinophils Absolute: 0 10*3/uL (ref 0.0–0.5)
Eosinophils Relative: 0 %
HCT: 41.5 % (ref 39.0–52.0)
Hemoglobin: 13.4 g/dL (ref 13.0–17.0)
Immature Granulocytes: 4 %
Lymphocytes Relative: 3 %
Lymphs Abs: 0.3 10*3/uL — ABNORMAL LOW (ref 0.7–4.0)
MCH: 31.7 pg (ref 26.0–34.0)
MCHC: 32.3 g/dL (ref 30.0–36.0)
MCV: 98.1 fL (ref 80.0–100.0)
Monocytes Absolute: 1.5 10*3/uL — ABNORMAL HIGH (ref 0.1–1.0)
Monocytes Relative: 16 %
Neutro Abs: 7.7 10*3/uL (ref 1.7–7.7)
Neutrophils Relative %: 77 %
Platelets: 252 10*3/uL (ref 150–400)
RBC: 4.23 MIL/uL (ref 4.22–5.81)
RDW: 12.8 % (ref 11.5–15.5)
WBC: 10 10*3/uL (ref 4.0–10.5)
nRBC: 0 % (ref 0.0–0.2)

## 2020-10-31 LAB — CULTURE, BLOOD (ROUTINE X 2)
Culture: NO GROWTH
Culture: NO GROWTH
Special Requests: ADEQUATE

## 2020-10-31 LAB — MAGNESIUM: Magnesium: 2.1 mg/dL (ref 1.7–2.4)

## 2020-10-31 LAB — D-DIMER, QUANTITATIVE: D-Dimer, Quant: 0.78 ug/mL-FEU — ABNORMAL HIGH (ref 0.00–0.50)

## 2020-10-31 LAB — C-REACTIVE PROTEIN: CRP: 1.3 mg/dL — ABNORMAL HIGH (ref <1.0)

## 2020-10-31 MED ORDER — FUROSEMIDE 40 MG PO TABS
40.0000 mg | ORAL_TABLET | Freq: Every day | ORAL | Status: DC
Start: 2020-11-01 — End: 2020-11-01

## 2020-10-31 MED ORDER — FUROSEMIDE 10 MG/ML IJ SOLN
20.0000 mg | Freq: Once | INTRAMUSCULAR | Status: AC
  Administered 2020-10-31: 20 mg via INTRAVENOUS
  Filled 2020-10-31: qty 2

## 2020-10-31 NOTE — Progress Notes (Signed)
[facility] Progress Note  Patient: Antonio [name]    SAY:[id number]  DOA: 10/26/2020     Date of Service: the patient was seen and examined on 10/31/2020  Brief hospital course: Past medical history of B-cell lymphoma, chemotherapy due in 2000, recent COVID-19 infection. Presents again to the hospital with worsening shortness of breath found to have worsening hypoxia as well as likely inflammation worsening from COVID-19 pneumonia. Currently plan is steroids and monitor for improvement in oxygenation.  Assessment and Plan: 1. Acute Hypoxic respiratory failure, POA, 74 % on 4LPM on admission.  Acute COVID-19 Viral Pneumonia CXR: hazy bilateral peripheral opacities CT chest: GGO, consolidation Oxygen requirement: Was on 11 L of oxygen when I saw the patient.  I was able to drop the patient to 7 L with saturation 88%.  Saturation above 88 is adequate.  On 3 L saturation dropped down to 86% with increasing heart rate CRP: 18.7-10.0-4.5-1.3 Remdesivir: Not indicated.  Completed 5-day treatment course last admission. Steroids: Back on the steroids.  Prednisone 50 mg daily. Baricitinib/Actemra: Not indicated right now although if the condition does worsen, Actemra has been approved by the patient and the family as well as oncologist. Antibiotics: Not indicated right now DVT Prophylaxis: enoxaparin (LOVENOX) injection 40 mg Start: 10/26/20 1715  Prone positioning and incentive spirometer use recommended.  The treatment plan and use of medications and known side effects were discussed with patient/family. It was clearly explained that complete risks and long-term side effects are unknown. Patient/family agree with the treatment plan.    2. history of DLBCL Per discussion between oncology and prior provider No contraindication for providing Actemra to the patient. Continue outpatient follow-up.  3. anemia Chronic in nature. Monitor.  4. essential hypertension Receiving IV diuresis  therefore holding her antihypertensive medication. Blood pressure stable for now Currently losartan is on hold  5. mild hyperkalemia Monitor for now.  6. HLD Continue statin  Diet: Cardiac diet DVT Prophylaxis:   enoxaparin (LOVENOX) injection 40 mg Start: 10/26/20 1715   Advance goals of care discussion: Full code  Family Communication: no family was present at bedside, at the time of interview.  Discussed with daughter on the phone.  Disposition:  Status is: Inpatient  Remains inpatient appropriate because:IV treatments appropriate due to intensity of illness or inability to take PO  Dispo: The patient is from: Home              Anticipated d/c is to: Home              Anticipated d/c date is: 3 days              Patient currently is not medically stable to d/c.  Subjective: Denies any acute complaint.  Continues to have cough.  Continues to have shortness of breath.  For some reason overnight his oxygenation worsened to 14 L.  At the time of my evaluation was on 11 L.  Physical Exam:  General: Appear in mild distress, no Rash; Oral Mucosa Clear, moist. no Abnormal Neck Mass Or lumps, Conjunctiva normal  Cardiovascular: S1 and S2 Present, no Murmur, Respiratory: increased respiratory effort, Bilateral Air entry present and bilateral  Crackles, no wheezes Abdomen: Bowel Sound present, Soft and no tenderness Extremities: trace Pedal edema Neurology: alert and oriented to time, place, and person affect appropriate. no new focal deficit Gait not checked due to patient safety concerns   Vitals:   10/31/20 0919 10/31/20 0920 10/31/20 0947 10/31/20 0951  BP:  Pulse: 75  78 72  Resp:      Temp:      TempSrc:      SpO2: (!) 84% (!) 85% 92% (!) 89%  Weight:      Height:        Intake/Output Summary (Last 24 hours) at 10/31/2020 1509 Last data filed at 10/31/2020 0500 Gross per 24 hour  Intake 240 ml  Output 900 ml  Net -660 ml   Filed Weights   10/26/20 1026  10/29/20 0444  Weight: 87 kg 83.5 kg    Data Reviewed: I have personally reviewed and interpreted daily labs, tele strips, imaging. I reviewed all nursing notes, pharmacy notes, vitals, pertinent old records I have discussed plan of care as described above with RN and patient/family.  CBC: Recent Labs  Lab 10/27/20 0802 10/28/20 0230 10/29/20 0211 10/30/20 0150 10/31/20 0302  WBC 8.6 9.9 12.8* 10.0 10.0  NEUTROABS 7.2 8.9* 11.6* 8.1* 7.7  HGB 14.2 13.9 13.6 13.3 13.4  HCT 44.4 40.8 40.0 38.6* 41.5  MCV 98.2 96.5 97.1 96.7 98.1  PLT 272 242 273 256 425   Basic Metabolic Panel: Recent Labs  Lab 10/27/20 0802 10/28/20 0230 10/29/20 0211 10/30/20 0150 10/31/20 0302  NA 134* 135 136 134* 135  K 4.6 4.8 5.5* 4.7 4.2  CL 97* 100 100 98 95*  CO2 25 26 26 26 30   GLUCOSE 143* 177* 186* 172* 131*  BUN 12 14 20 19  24*  CREATININE 0.68 0.69 0.91 0.77 0.84  CALCIUM 8.3* 8.1* 8.5* 8.5* 8.7*  MG 2.3 2.4 2.3 2.2 2.1    Studies: No results found.  Scheduled Meds: . vitamin C  500 mg Oral Daily  . benzonatate  100 mg Oral TID  . Chlorhexidine Gluconate Cloth  6 each Topical Daily  . chlorpheniramine-HYDROcodone  5 mL Oral Q12H  . enoxaparin (LOVENOX) injection  40 mg Subcutaneous Q24H  . feeding supplement  237 mL Oral BID BM  . fluticasone  2 spray Each Nare Daily  . furosemide  20 mg Intravenous Once  . guaiFENesin  600 mg Oral BID  . oxymetazoline  1 spray Each Nare BID  . polyethylene glycol  17 g Oral Daily  . predniSONE  50 mg Oral Daily  . senna-docusate  1 tablet Oral BID  . simvastatin  40 mg Oral q1800  . zinc sulfate  220 mg Oral Daily   Continuous Infusions: PRN Meds: acetaminophen, albuterol, diphenhydrAMINE, guaiFENesin-dextromethorphan, ondansetron **OR** ondansetron (ZOFRAN) IV, polyethylene glycol  Time spent: 35 minutes  Author: Berle Mull, MD [facility] 10/31/2020 3:09 PM  To reach On-call, see care teams to locate the attending and  reach out via [email or web address]. Between 7PM-7AM, please contact night-coverage If you still have difficulty reaching the attending provider, please page the [facility] (Director on Call) for [facility] on amion for assistance.

## 2020-11-01 DIAGNOSIS — J9601 Acute respiratory failure with hypoxia: Secondary | ICD-10-CM | POA: Diagnosis not present

## 2020-11-01 LAB — CBC WITH DIFFERENTIAL/PLATELET
Abs Immature Granulocytes: 0.35 10*3/uL — ABNORMAL HIGH (ref 0.00–0.07)
Basophils Absolute: 0.1 10*3/uL (ref 0.0–0.1)
Basophils Relative: 1 %
Eosinophils Absolute: 0 10*3/uL (ref 0.0–0.5)
Eosinophils Relative: 0 %
HCT: 42.6 % (ref 39.0–52.0)
Hemoglobin: 14.2 g/dL (ref 13.0–17.0)
Immature Granulocytes: 4 %
Lymphocytes Relative: 6 %
Lymphs Abs: 0.6 10*3/uL — ABNORMAL LOW (ref 0.7–4.0)
MCH: 32.6 pg (ref 26.0–34.0)
MCHC: 33.3 g/dL (ref 30.0–36.0)
MCV: 97.7 fL (ref 80.0–100.0)
Monocytes Absolute: 1.3 10*3/uL — ABNORMAL HIGH (ref 0.1–1.0)
Monocytes Relative: 14 %
Neutro Abs: 7.1 10*3/uL (ref 1.7–7.7)
Neutrophils Relative %: 75 %
Platelets: 230 10*3/uL (ref 150–400)
RBC: 4.36 MIL/uL (ref 4.22–5.81)
RDW: 12.8 % (ref 11.5–15.5)
WBC: 9.4 10*3/uL (ref 4.0–10.5)
nRBC: 0 % (ref 0.0–0.2)

## 2020-11-01 LAB — COMPREHENSIVE METABOLIC PANEL
ALT: 50 U/L — ABNORMAL HIGH (ref 0–44)
AST: 17 U/L (ref 15–41)
Albumin: 2.6 g/dL — ABNORMAL LOW (ref 3.5–5.0)
Alkaline Phosphatase: 50 U/L (ref 38–126)
Anion gap: 11 (ref 5–15)
BUN: 27 mg/dL — ABNORMAL HIGH (ref 8–23)
CO2: 35 mmol/L — ABNORMAL HIGH (ref 22–32)
Calcium: 8.7 mg/dL — ABNORMAL LOW (ref 8.9–10.3)
Chloride: 96 mmol/L — ABNORMAL LOW (ref 98–111)
Creatinine, Ser: 0.96 mg/dL (ref 0.61–1.24)
GFR, Estimated: >60 mL/min (ref >60)
Glucose, Bld: 115 mg/dL — ABNORMAL HIGH (ref 70–99)
Potassium: 4.7 mmol/L (ref 3.5–5.1)
Sodium: 142 mmol/L (ref 135–145)
Total Bilirubin: 0.8 mg/dL (ref 0.3–1.2)
Total Protein: 5.6 g/dL — ABNORMAL LOW (ref 6.5–8.1)

## 2020-11-01 LAB — D-DIMER, QUANTITATIVE: D-Dimer, Quant: 0.94 ug/mL-FEU — ABNORMAL HIGH (ref 0.00–0.50)

## 2020-11-01 LAB — C-REACTIVE PROTEIN: CRP: 2.8 mg/dL — ABNORMAL HIGH (ref <1.0)

## 2020-11-01 MED ORDER — SODIUM CHLORIDE 0.9% FLUSH
10.0000 mL | INTRAVENOUS | Status: DC | PRN
  Administered 2020-11-03 – 2020-11-10 (×2): 10 mL

## 2020-11-01 MED ORDER — SODIUM CHLORIDE 0.9 % IV SOLN: INTRAVENOUS | Status: DC

## 2020-11-01 MED ORDER — METHYLPREDNISOLONE SODIUM SUCC 40 MG IJ SOLR
40.0000 mg | Freq: Two times a day (BID) | INTRAMUSCULAR | Status: DC
  Administered 2020-11-01 – 2020-11-05 (×9): 40 mg via INTRAVENOUS
  Filled 2020-11-01 (×9): qty 1

## 2020-11-01 NOTE — Progress Notes (Signed)
[facility] Progress Note  Patient: Antonio [name]    CWC:[id number]  DOA: 10/26/2020     Date of Service: the patient was seen and examined on 11/01/2020  Brief hospital course: Past medical history of B-cell lymphoma, chemotherapy due in 2000, recent COVID-19 infection. Presents again to the hospital with worsening shortness of breath found to have worsening hypoxia as well as likely inflammation worsening from COVID-19 pneumonia. Currently plan is steroids and monitor for improvement in oxygenation.  Assessment and Plan: 1. Acute Hypoxic respiratory failure, POA, 74 % on 4LPM on admission.  Acute COVID-19 Viral Pneumonia CXR: hazy bilateral peripheral opacities CT chest: GGO, consolidation Oxygen requirement: Was on 11 L of oxygen when I saw the patient.  I was able to drop the patient to 7 L with saturation 88%.  Saturation above 88 is adequate.  On 3 L saturation dropped down to 86% with increasing heart rate CRP: 18.7-10.0-4.5-1.3-2.8  Remdesivir: Not indicated.  Completed 5-day treatment course last admission. Steroids: Back on the steroids.  Prednisone 50 mg daily. Was increased to IV Solu-Medrol given patient's persistent hypoxia and mild increase in CRP. Baricitinib/Actemra: If the CRP continues to rise further tomorrow will provide Actemra. Or hypoxia worsens we will provide Actemra as well. Although given that the patient's symptoms actually started a week and a half prior to Christmas not sure how effective Actemra will be and therefore the risk benefit analysis is not favorable for now, and thus currently holding off. Antibiotics: Not indicated right now DVT Prophylaxis: enoxaparin (LOVENOX) injection 40 mg Start: 10/26/20 1715  Prone positioning and incentive spirometer use recommended.  The treatment plan and use of medications and known side effects were discussed with patient/family. It was clearly explained that complete risks and long-term side effects are unknown.  Patient/family agree with the treatment plan.    2. history of DLBCL Per discussion between oncology and prior provider No contraindication for providing Actemra to the patient. Continue outpatient follow-up.  3. Anemia Chronic in nature. Monitor.  4. Essential hypertension Blood pressure soft today. Continue to hold antihypertensive regimen. Also hold diuresis.  5. mild hyperkalemia Monitor for now.  6. HLD Continue statin  Diet: Cardiac diet DVT Prophylaxis:   enoxaparin (LOVENOX) injection 40 mg Start: 10/26/20 1715   Advance goals of care discussion: Full code  Family Communication: no family was present at bedside, at the time of interview.  Disposition:  Status is: Inpatient  Remains inpatient appropriate because:IV treatments appropriate due to intensity of illness or inability to take PO  Dispo: The patient is from: Home              Anticipated d/c is to: Home              Anticipated d/c date is: 3 days              Patient currently is not medically stable to d/c.  Subjective: No nausea no vomiting. No fever no chills. No chest pain. No abdominal pain. No dizziness no lightheadedness. Continues to have cough. Continues to have shortness of breath. Continues to have fatigue. Unchanged from yesterday.  Physical Exam: General: Appear in mild distress, no Rash; Oral Mucosa Clear, moist. no Abnormal Neck Mass Or lumps, Conjunctiva normal  Cardiovascular: S1 and S2 Present, no Murmur, Respiratory: increased respiratory effort, Bilateral Air entry present and bilateral  Crackles, no wheezes Abdomen: Bowel Sound present, Soft and no tenderness Extremities: trace Pedal edema Neurology: alert and oriented to time,  place, and person affect appropriate. no new focal deficit Gait not checked due to patient safety concerns   Vitals:   11/01/20 0455 11/01/20 0845 11/01/20 1042 11/01/20 1245  BP: 106/62 98/70  (!) 88/54  Pulse: (!) 53 97  87  Resp: 15     Temp: 98 F  (36.7 C)  97.9 F (36.6 C)   TempSrc: Oral  Oral   SpO2: 93% 92%  96%  Weight: 82.8 kg     Height:        Intake/Output Summary (Last 24 hours) at 11/01/2020 1420 Last data filed at 10/31/2020 2120 Gross per 24 hour  Intake 100 ml  Output 420 ml  Net -320 ml   Filed Weights   10/26/20 1026 10/29/20 0444 11/01/20 0455  Weight: 87 kg 83.5 kg 82.8 kg    Data Reviewed: I have personally reviewed and interpreted daily labs, tele strips, imaging. I reviewed all nursing notes, pharmacy notes, vitals, pertinent old records I have discussed plan of care as described above with RN and patient/family.  CBC: Recent Labs  Lab 10/28/20 0230 10/29/20 0211 10/30/20 0150 10/31/20 0302 11/01/20 0649  WBC 9.9 12.8* 10.0 10.0 9.4  NEUTROABS 8.9* 11.6* 8.1* 7.7 7.1  HGB 13.9 13.6 13.3 13.4 14.2  HCT 40.8 40.0 38.6* 41.5 42.6  MCV 96.5 97.1 96.7 98.1 97.7  PLT 242 273 256 252 962   Basic Metabolic Panel: Recent Labs  Lab 10/27/20 0802 10/28/20 0230 10/29/20 0211 10/30/20 0150 10/31/20 0302 11/01/20 0649  NA 134* 135 136 134* 135 142  K 4.6 4.8 5.5* 4.7 4.2 4.7  CL 97* 100 100 98 95* 96*  CO2 25 26 26 26 30  35*  GLUCOSE 143* 177* 186* 172* 131* 115*  BUN 12 14 20 19  24* 27*  CREATININE 0.68 0.69 0.91 0.77 0.84 0.96  CALCIUM 8.3* 8.1* 8.5* 8.5* 8.7* 8.7*  MG 2.3 2.4 2.3 2.2 2.1  --     Studies: No results found.  Scheduled Meds: . vitamin C  500 mg Oral Daily  . benzonatate  100 mg Oral TID  . Chlorhexidine Gluconate Cloth  6 each Topical Daily  . chlorpheniramine-HYDROcodone  5 mL Oral Q12H  . enoxaparin (LOVENOX) injection  40 mg Subcutaneous Q24H  . feeding supplement  237 mL Oral BID BM  . fluticasone  2 spray Each Nare Daily  . guaiFENesin  600 mg Oral BID  . methylPREDNISolone (SOLU-MEDROL) injection  40 mg Intravenous BID  . polyethylene glycol  17 g Oral Daily  . senna-docusate  1 tablet Oral BID  . simvastatin  40 mg Oral q1800  . zinc sulfate  220 mg Oral  Daily   Continuous Infusions: PRN Meds: acetaminophen, albuterol, diphenhydrAMINE, guaiFENesin-dextromethorphan, ondansetron **OR** ondansetron (ZOFRAN) IV, polyethylene glycol  Time spent: 35 minutes  Author: Berle Mull, MD [facility] 11/01/2020 2:20 PM  To reach On-call, see care teams to locate the attending and reach out via [email or web address]. Between 7PM-7AM, please contact night-coverage If you still have difficulty reaching the attending provider, please page the [facility] (Director on Call) for [facility] on amion for assistance.

## 2020-11-02 ENCOUNTER — Inpatient Hospital Stay (HOSPITAL_COMMUNITY): Payer: PPO

## 2020-11-02 DIAGNOSIS — J9601 Acute respiratory failure with hypoxia: Secondary | ICD-10-CM

## 2020-11-02 LAB — CBC WITH DIFFERENTIAL/PLATELET
Abs Immature Granulocytes: 0.46 10*3/uL — ABNORMAL HIGH (ref 0.00–0.07)
Basophils Absolute: 0.1 10*3/uL (ref 0.0–0.1)
Basophils Relative: 0 %
Eosinophils Absolute: 0 10*3/uL (ref 0.0–0.5)
Eosinophils Relative: 0 %
HCT: 40.7 % (ref 39.0–52.0)
Hemoglobin: 13.5 g/dL (ref 13.0–17.0)
Immature Granulocytes: 4 %
Lymphocytes Relative: 3 %
Lymphs Abs: 0.3 10*3/uL — ABNORMAL LOW (ref 0.7–4.0)
MCH: 32.9 pg (ref 26.0–34.0)
MCHC: 33.2 g/dL (ref 30.0–36.0)
MCV: 99.3 fL (ref 80.0–100.0)
Monocytes Absolute: 0.3 10*3/uL (ref 0.1–1.0)
Monocytes Relative: 3 %
Neutro Abs: 10.1 10*3/uL — ABNORMAL HIGH (ref 1.7–7.7)
Neutrophils Relative %: 90 %
Platelets: 242 10*3/uL (ref 150–400)
RBC: 4.1 MIL/uL — ABNORMAL LOW (ref 4.22–5.81)
RDW: 12.8 % (ref 11.5–15.5)
WBC: 11.3 10*3/uL — ABNORMAL HIGH (ref 4.0–10.5)
nRBC: 0 % (ref 0.0–0.2)

## 2020-11-02 LAB — COMPREHENSIVE METABOLIC PANEL
ALT: 45 U/L — ABNORMAL HIGH (ref 0–44)
AST: 16 U/L (ref 15–41)
Albumin: 2.4 g/dL — ABNORMAL LOW (ref 3.5–5.0)
Alkaline Phosphatase: 46 U/L (ref 38–126)
Anion gap: 9 (ref 5–15)
BUN: 25 mg/dL — ABNORMAL HIGH (ref 8–23)
CO2: 32 mmol/L (ref 22–32)
Calcium: 8.4 mg/dL — ABNORMAL LOW (ref 8.9–10.3)
Chloride: 99 mmol/L (ref 98–111)
Creatinine, Ser: 0.85 mg/dL (ref 0.61–1.24)
GFR, Estimated: >60 mL/min (ref >60)
Glucose, Bld: 155 mg/dL — ABNORMAL HIGH (ref 70–99)
Potassium: 4.6 mmol/L (ref 3.5–5.1)
Sodium: 140 mmol/L (ref 135–145)
Total Bilirubin: 0.7 mg/dL (ref 0.3–1.2)
Total Protein: 5.3 g/dL — ABNORMAL LOW (ref 6.5–8.1)

## 2020-11-02 LAB — ECHOCARDIOGRAM LIMITED
Area-P 1/2: 2.8 cm2
Height: 72 in
S' Lateral: 3.2 cm
Weight: 2850.11 oz

## 2020-11-02 LAB — PROCALCITONIN: Procalcitonin: <0.1 ng/mL

## 2020-11-02 LAB — D-DIMER, QUANTITATIVE: D-Dimer, Quant: 0.84 ug/mL-FEU — ABNORMAL HIGH (ref 0.00–0.50)

## 2020-11-02 LAB — C-REACTIVE PROTEIN: CRP: 3.1 mg/dL — ABNORMAL HIGH (ref <1.0)

## 2020-11-02 MED ORDER — LACTULOSE 10 GM/15ML PO SOLN
10.0000 g | Freq: Every day | ORAL | Status: DC
  Administered 2020-11-02 – 2020-11-11 (×10): 10 g via ORAL
  Filled 2020-11-02 (×10): qty 15

## 2020-11-02 MED ORDER — OXYMETAZOLINE HCL 0.05 % NA SOLN
1.0000 | Freq: Two times a day (BID) | NASAL | Status: DC
  Administered 2020-11-02 – 2020-11-10 (×17): 1 via NASAL
  Filled 2020-11-02: qty 30

## 2020-11-02 NOTE — Progress Notes (Signed)
Occupational Therapy Treatment Patient Details Name: Antonio [name] MRN: [id number] DOB: Dec 10, 1940 Today's Date: 11/02/2020    History of present illness Pt is an 80 year old male who is vaccinated against COVID-19 with past medical history of left bundle branch block, essential tremor, prior melanoma, right testicular large B-cell lymphoma , hypertension, hyperlipidemia, MGUS who returned to the hospital on 10/26/20 with acute respiratory failure with hypoxia. Pt was discharged on 10/23/20 after admission for COVID-19.   OT comments  Pt immediately willing to work with OT. Ambulated laps in room x 3 and stood at sink for oral care. Pt with Sp02 81-83% on 8L with exertion, but with seated rest break and deep breathing rebounded to 91%. Returned pt to 7L at end of session and notified RN of need for cough medicine. Pt in good spirits.   Follow Up Recommendations  Home health OT;Supervision/Assistance - 24 hour    Equipment Recommendations  None recommended by OT    Recommendations for Other Services      Precautions / Restrictions Precautions Precautions: Fall       Mobility Bed Mobility Overal bed mobility: Modified Independent                Transfers Overall transfer level: Needs assistance Equipment used: Rolling walker (2 wheeled) Transfers: Sit to/from Stand Sit to Stand: Supervision              Balance     Sitting balance-Leahy Scale: Good       Standing balance-Leahy Scale: Poor Standing balance comment: at sink, stabilizes one hand on sink                           ADL either performed or assessed with clinical judgement   ADL Overall ADL's : Needs assistance/impaired     Grooming: Oral care;Min guard;Standing Grooming Details (indicate cue type and reason): x2 min                             Functional mobility during ADLs: Min guard;Rolling walker General ADL Comments: ambulated 2 laps in room, rest break and one more  lap with RW     Vision       Perception     Praxis      Cognition Arousal/Alertness: Awake/alert Behavior During Therapy: WFL for tasks assessed/performed Overall Cognitive Status: No family/caregiver present to determine baseline cognitive functioning Area of Impairment: Memory                     Memory: Decreased short-term memory                  Exercises     Shoulder Instructions       General Comments      Pertinent Vitals/ Pain       Pain Assessment: No/denies pain  Home Living                                          Prior Functioning/Environment              Frequency  Min 2X/week        Progress Toward Goals  OT Goals(current goals can now be found in the care plan section)  Progress towards OT goals: Progressing toward goals  Acute  Rehab OT Goals Patient Stated Goal: breathe better OT Goal Formulation: With patient Time For Goal Achievement: 11/12/20 Potential to Achieve Goals: Good  Plan Discharge plan remains appropriate    Co-evaluation                 AM-PAC OT "6 Clicks" Daily Activity     Outcome Measure   Help from another person eating meals?: None Help from another person taking care of personal grooming?: A Little Help from another person toileting, which includes using toliet, bedpan, or urinal?: A Little Help from another person bathing (including washing, rinsing, drying)?: A Little Help from another person to put on and taking off regular upper body clothing?: None Help from another person to put on and taking off regular lower body clothing?: A Little 6 Click Score: 20    End of Session Equipment Utilized During Treatment: Rolling walker;Oxygen  OT Visit Diagnosis: Unsteadiness on feet (R26.81);Cognitive communication deficit (R41.841)   Activity Tolerance Patient tolerated treatment well   Patient Left in bed;with call bell/phone within reach   Nurse Communication  Other (comment) (wants cough meds and fruit cup)        Time: 1510-1536 OT Time Calculation (min): 26 min  Charges: OT General Charges $OT Visit: 1 Visit OT Treatments $Self Care/Home Management : 8-22 mins $Therapeutic Activity: 8-22 mins  Nestor Lewandowsky, OTR/L Acute Rehabilitation Services Pager: [phone] Office: [phone]   Malka So 11/02/2020, 3:45 PM

## 2020-11-02 NOTE — Plan of Care (Signed)

## 2020-11-02 NOTE — Progress Notes (Signed)
  Echocardiogram 2D Echocardiogram has been performed.  Jennette Dubin 11/02/2020, 2:10 PM

## 2020-11-02 NOTE — Progress Notes (Signed)
[facility] Progress Note  Patient: Antonio [name]    [id number]  DOA: 10/26/2020     Date of Service: the patient was seen and examined on 11/02/2020  Brief hospital course: Past medical history of B-cell lymphoma, chemotherapy due in 2000, recent COVID-19 infection. Presents again to the hospital with worsening shortness of breath found to have worsening hypoxia as well as likely inflammation worsening from COVID-19 pneumonia. Currently plan is steroids and monitor for improvement in oxygenation.  Assessment and Plan: 1. Acute Hypoxic respiratory failure, POA, 74 % on 4LPM on admission.  Acute COVID-19 Viral Pneumonia Elevated right hemidiaphragm. CXR: hazy bilateral peripheral opacities CT chest: GGO, consolidation Oxygen requirement: On 6 LPM right 90% saturation. CRP: 18.7-10.0-4.5-1.3-2.8-3.1 Remdesivir: Not indicated.  Completed 5-day treatment course last admission. Steroids: Back on the steroids.  Prednisone 50 mg daily. Was increased to IV Solu-Medrol given patient's persistent hypoxia and mild increase in CRP. Baricitinib/Actemra: Discussed with patient.  Currently agreeable for both acetaminophen as well as Actemra.  Not contraindicated right now in any which way. Currently given that the patient is actually showing improvement in oxygenation I am holding off on use of this medication to avoid side effects.  This Antibiotics: Not indicated right now DVT Prophylaxis: enoxaparin (LOVENOX) injection 40 mg Start: 10/26/20 1715 Patient has significantly elevated right diaphragm which likely is contributing to his hypoxia. Procalcitonin level negative. Prone positioning and incentive spirometer use recommended.  The treatment plan and use of medications and known side effects were discussed with patient/family. It was clearly explained that complete risks and long-term side effects are unknown. Patient/family agree with the treatment plan.    2. history of DLBCL Per  discussion between oncology and prior provider No contraindication for providing Actemra to the patient. Continue outpatient follow-up.  3.  Abnormal hemoglobin, lab error, no anemia Patient had an hemoglobin level of 6.  This was on Adderall.  No anemia.  4. Essential hypertension Blood pressure soft today. Continue to hold antihypertensive regimen. Also hold diuresis.  5. mild hyperkalemia-resolved. Monitor for now.  6. HLD Continue statin  Diet: Cardiac diet DVT Prophylaxis:   enoxaparin (LOVENOX) injection 40 mg Start: 10/26/20 1715   Advance goals of care discussion: Full code  Family Communication: no family was present at bedside, at the time of interview.  Disposition:  Status is: Inpatient  Remains inpatient appropriate because:IV treatments appropriate due to intensity of illness or inability to take PO  Dispo: The patient is from: Home              Anticipated d/c is to: Home              Anticipated d/c date is: 3 days              Patient currently is not medically stable to d/c.  Subjective: No acute complaint.  No fever or chills but continues to have fatigue.  Physical Exam: General: Appear in mild distress, no Rash; Oral Mucosa Clear, moist. no Abnormal Neck Mass Or lumps, Conjunctiva normal  Cardiovascular: S1 and S2 Present, no Murmur, Respiratory: increased respiratory effort, Bilateral Air entry present and bilateral  Crackles, no wheezes Abdomen: Bowel Sound present, Soft and no tenderness Extremities: trace Pedal edema Neurology: alert and oriented to time, place, and person affect appropriate. no new focal deficit Gait not checked due to patient safety concerns  Vitals:   11/02/20 0513 11/02/20 0758 11/02/20 1439 11/02/20 2114  BP:  (!) 98/58 128/80 126/83  Pulse:  97 80 78  Resp:  18 16 20   Temp:  98.5 F (36.9 C) 97.6 F (36.4 C) 98.8 F (37.1 C)  TempSrc:  Axillary Axillary Oral  SpO2:  96% 91% 92%  Weight: 80.8 kg     Height:         Intake/Output Summary (Last 24 hours) at 11/02/2020 2137 Last data filed at 11/02/2020 1900 Gross per 24 hour  Intake 675.13 ml  Output 750 ml  Net -74.87 ml   Filed Weights   10/29/20 0444 11/01/20 0455 11/02/20 0513  Weight: 83.5 kg 82.8 kg 80.8 kg    Data Reviewed: I have personally reviewed and interpreted daily labs, tele strips, imaging. I reviewed all nursing notes, pharmacy notes, vitals, pertinent old records I have discussed plan of care as described above with RN and patient/family.  CBC: Recent Labs  Lab 10/29/20 0211 10/30/20 0150 10/31/20 0302 11/01/20 0649 11/02/20 0521  WBC 12.8* 10.0 10.0 9.4 11.3*  NEUTROABS 11.6* 8.1* 7.7 7.1 10.1*  HGB 13.6 13.3 13.4 14.2 13.5  HCT 40.0 38.6* 41.5 42.6 40.7  MCV 97.1 96.7 98.1 97.7 99.3  PLT 273 256 252 230 979   Basic Metabolic Panel: Recent Labs  Lab 10/27/20 0802 10/28/20 0230 10/29/20 0211 10/30/20 0150 10/31/20 0302 11/01/20 0649 11/02/20 0521  NA 134* 135 136 134* 135 142 140  K 4.6 4.8 5.5* 4.7 4.2 4.7 4.6  CL 97* 100 100 98 95* 96* 99  CO2 25 26 26 26 30  35* 32  GLUCOSE 143* 177* 186* 172* 131* 115* 155*  BUN 12 14 20 19  24* 27* 25*  CREATININE 0.68 0.69 0.91 0.77 0.84 0.96 0.85  CALCIUM 8.3* 8.1* 8.5* 8.5* 8.7* 8.7* 8.4*  MG 2.3 2.4 2.3 2.2 2.1  --   --     Studies: DG CHEST PORT 1 VIEW  Result Date: 11/02/2020 CLINICAL DATA:  Shortness of breath. EXAM: PORTABLE CHEST 1 VIEW COMPARISON:  October 28, 2020. FINDINGS: Stable cardiomediastinal silhouette. Stable elevated right hemidiaphragm is noted with increased right basilar atelectasis or infiltrate. No pneumothorax is noted. Mild left basilar atelectasis or infiltrate is noted. Right internal jugular Port-A-Cath is unchanged. Bony thorax is unremarkable. IMPRESSION: Stable elevated right hemidiaphragm with increased right basilar atelectasis or infiltrate. Mild left basilar atelectasis or infiltrate is noted. Electronically Signed   By: Marijo Conception M.D.   On: 11/02/2020 13:19   DG Abd Portable 1V  Result Date: 11/02/2020 CLINICAL DATA:  Shortness of breath. EXAM: PORTABLE ABDOMEN - 1 VIEW COMPARISON:  July 24, 2019. FINDINGS: The bowel gas pattern is normal. No radio-opaque calculi or other significant radiographic abnormality are seen. IMPRESSION: Negative. Electronically Signed   By: Marijo Conception M.D.   On: 11/02/2020 13:20   ECHOCARDIOGRAM LIMITED  Result Date: 11/02/2020    ECHOCARDIOGRAM LIMITED REPORT   Patient Name:   Antonio [name] Date of Exam: 11/02/2020 Medical Rec #:  [id number]      Height:       72.0 in Accession #:    [id number]     Weight:       178.1 lb Date of Birth:  Nov 25, 1940      BSA:          2.028 m Patient Age:    37 years       BP:           98/58 mmHg Patient Gender: M  HR:           97 bpm. Exam Location:  Inpatient Procedure: Limited Echo, Limited Color Doppler and Cardiac Doppler Indications:    Dyspnea R06.00  History:        Patient has no prior history of Echocardiogram examinations.                 Covid-19 Positive; Risk Factors:Hypertension and Dyslipidemia.  Sonographer:    Mikki Santee RDCS (AE) Referring Phys: [id number] [place]  1. Left ventricular ejection fraction, by estimation, is 50%. The left ventricle has low normal function. The left ventricle has no regional wall motion abnormalities. There is mild left ventricular hypertrophy. Left ventricular diastolic parameters are  consistent with Grade I diastolic dysfunction (impaired relaxation). There is abnormal septal motion due to conduction delay.  2. Right ventricular systolic function is mildly reduced. The right ventricular size is normal. Tricuspid regurgitation signal is inadequate for assessing PA pressure.  3. The mitral valve is grossly normal. No evidence of mitral valve regurgitation.  4. The aortic valve is grossly normal. There is mild calcification of the aortic valve. Aortic valve regurgitation is  not visualized. No aortic stenosis is present.  5. The inferior vena cava is normal in size with greater than 50% respiratory variability, suggesting right atrial pressure of 3 mmHg. FINDINGS  Left Ventricle: Left ventricular ejection fraction, by estimation, is 50%. The left ventricle has low normal function. The left ventricle has no regional wall motion abnormalities. The left ventricular internal cavity size was normal in size. There is mild left ventricular hypertrophy. Abnormal septal motion due to conduction delay. Left ventricular diastolic parameters are consistent with Grade I diastolic dysfunction (impaired relaxation). Right Ventricle: The right ventricular size is normal. Right ventricular systolic function is mildly reduced. Tricuspid regurgitation signal is inadequate for assessing PA pressure. Mitral Valve: The mitral valve is grossly normal. Tricuspid Valve: The tricuspid valve is normal in structure. Tricuspid valve regurgitation is trivial. Aortic Valve: The aortic valve is grossly normal. There is mild calcification of the aortic valve. Aortic valve regurgitation is not visualized. No aortic stenosis is present. Aorta: The aortic root is normal in size and structure. Venous: The inferior vena cava is normal in size with greater than 50% respiratory variability, suggesting right atrial pressure of 3 mmHg. LEFT VENTRICLE PLAX 2D LVIDd:         4.70 cm  Diastology LVIDs:         3.20 cm  LV e' medial:    5.77 cm/s LV PW:         1.30 cm  LV E/e' medial:  13.8 LV IVS:        1.30 cm  LV e' lateral:   9.25 cm/s LVOT diam:     2.20 cm  LV E/e' lateral: 8.6 LVOT Area:     3.80 cm  LEFT ATRIUM             Index       RIGHT ATRIUM           Index LA diam:        3.20 cm 1.58 cm/m  RA Area:     11.60 cm LA Vol (A2C):   27.0 ml 13.31 ml/m RA Volume:   20.10 ml  9.91 ml/m LA Vol (A4C):   30.3 ml 14.94 ml/m LA Biplane Vol: 29.6 ml 14.59 ml/m   AORTA Ao Root diam: 3.50 cm MITRAL VALVE MV Area (PHT):  2.80 cm     SHUNTS MV Decel Time: 271 msec     Systemic Diam: 2.20 cm MV E velocity: 79.60 cm/s MV A velocity: 103.00 cm/s MV E/A ratio:  0.77 Cherlynn Kaiser MD Electronically signed by Cherlynn Kaiser MD Signature Date/Time: 11/02/2020/5:59:16 PM    Final     Scheduled Meds: . vitamin C  500 mg Oral Daily  . benzonatate  100 mg Oral TID  . Chlorhexidine Gluconate Cloth  6 each Topical Daily  . chlorpheniramine-HYDROcodone  5 mL Oral Q12H  . enoxaparin (LOVENOX) injection  40 mg Subcutaneous Q24H  . feeding supplement  237 mL Oral BID BM  . fluticasone  2 spray Each Nare Daily  . guaiFENesin  600 mg Oral BID  . lactulose  10 g Oral Daily  . methylPREDNISolone (SOLU-MEDROL) injection  40 mg Intravenous BID  . oxymetazoline  1 spray Each Nare BID  . polyethylene glycol  17 g Oral Daily  . senna-docusate  1 tablet Oral BID  . simvastatin  40 mg Oral q1800  . zinc sulfate  220 mg Oral Daily   Continuous Infusions: PRN Meds: acetaminophen, albuterol, diphenhydrAMINE, guaiFENesin-dextromethorphan, ondansetron **OR** ondansetron (ZOFRAN) IV, polyethylene glycol, sodium chloride flush  Time spent: 35 minutes  Author: Berle Mull, MD [facility] 11/02/2020 9:37 PM  To reach On-call, see care teams to locate the attending and reach out via [email or web address]. Between 7PM-7AM, please contact night-coverage If you still have difficulty reaching the attending provider, please page the [facility] (Director on Call) for [facility] on amion for assistance.

## 2020-11-03 DIAGNOSIS — J9601 Acute respiratory failure with hypoxia: Secondary | ICD-10-CM | POA: Diagnosis not present

## 2020-11-03 LAB — CBC WITH DIFFERENTIAL/PLATELET
Abs Immature Granulocytes: 0.45 10*3/uL — ABNORMAL HIGH (ref 0.00–0.07)
Basophils Absolute: 0 10*3/uL (ref 0.0–0.1)
Basophils Relative: 0 %
Eosinophils Absolute: 0 10*3/uL (ref 0.0–0.5)
Eosinophils Relative: 0 %
HCT: 41 % (ref 39.0–52.0)
Hemoglobin: 12.9 g/dL — ABNORMAL LOW (ref 13.0–17.0)
Immature Granulocytes: 3 %
Lymphocytes Relative: 2 %
Lymphs Abs: 0.4 10*3/uL — ABNORMAL LOW (ref 0.7–4.0)
MCH: 31.3 pg (ref 26.0–34.0)
MCHC: 31.5 g/dL (ref 30.0–36.0)
MCV: 99.5 fL (ref 80.0–100.0)
Monocytes Absolute: 0.8 10*3/uL (ref 0.1–1.0)
Monocytes Relative: 5 %
Neutro Abs: 13.5 10*3/uL — ABNORMAL HIGH (ref 1.7–7.7)
Neutrophils Relative %: 90 %
Platelets: 247 10*3/uL (ref 150–400)
RBC: 4.12 MIL/uL — ABNORMAL LOW (ref 4.22–5.81)
RDW: 12.7 % (ref 11.5–15.5)
WBC: 15.2 10*3/uL — ABNORMAL HIGH (ref 4.0–10.5)
nRBC: 0 % (ref 0.0–0.2)

## 2020-11-03 LAB — COMPREHENSIVE METABOLIC PANEL
ALT: 39 U/L (ref 0–44)
AST: 17 U/L (ref 15–41)
Albumin: 2.5 g/dL — ABNORMAL LOW (ref 3.5–5.0)
Alkaline Phosphatase: 43 U/L (ref 38–126)
Anion gap: 9 (ref 5–15)
BUN: 26 mg/dL — ABNORMAL HIGH (ref 8–23)
CO2: 31 mmol/L (ref 22–32)
Calcium: 8.6 mg/dL — ABNORMAL LOW (ref 8.9–10.3)
Chloride: 98 mmol/L (ref 98–111)
Creatinine, Ser: 0.77 mg/dL (ref 0.61–1.24)
GFR, Estimated: >60 mL/min (ref >60)
Glucose, Bld: 147 mg/dL — ABNORMAL HIGH (ref 70–99)
Potassium: 4.6 mmol/L (ref 3.5–5.1)
Sodium: 138 mmol/L (ref 135–145)
Total Bilirubin: 0.8 mg/dL (ref 0.3–1.2)
Total Protein: 5.2 g/dL — ABNORMAL LOW (ref 6.5–8.1)

## 2020-11-03 LAB — GLUCOSE, CAPILLARY: Glucose-Capillary: 200 mg/dL — ABNORMAL HIGH (ref 70–99)

## 2020-11-03 LAB — PROCALCITONIN: Procalcitonin: <0.1 ng/mL

## 2020-11-03 LAB — C-REACTIVE PROTEIN: CRP: 1.3 mg/dL — ABNORMAL HIGH (ref <1.0)

## 2020-11-03 LAB — D-DIMER, QUANTITATIVE: D-Dimer, Quant: 0.76 ug/mL-FEU — ABNORMAL HIGH (ref 0.00–0.50)

## 2020-11-03 NOTE — Progress Notes (Addendum)
[facility] Progress Note  Patient: Antonio [name]    BJY:[id number]  DOA: 10/26/2020     Date of Service: the patient was seen and examined on 11/03/2020  Brief hospital course: Past medical history of B-cell lymphoma, chemotherapy due in 2000, recent COVID-19 infection. Presents again to the hospital with worsening shortness of breath found to have worsening hypoxia as well as likely inflammation worsening from COVID-19 pneumonia.  Currently plan is continue IV steroids and monitor for improvement in oxygenation.  Assessment and Plan: 1. Acute Hypoxic respiratory failure, POA,  74 % on 4LPM on admission.  Acute COVID-19 Viral Pneumonia Chronically elevated right hemidiaphragm.  CXR: hazy bilateral peripheral opacities CT chest: GGO, consolidation Oxygen requirement: On 6 LPM right 90% saturation on exertion.  4 LPM at rest 92%. CRP: 18.7-10.0-4.5-1.3-2.8-3.1-1.3 Remdesivir: Not indicated.  Completed 5-day treatment course last admission. Steroids: Back on the steroids.  Was on IV steroids initially with rapid taper to prednisone but due to worsening CRP and hypoxia back on IV steroids.  Start tapering IV steroids 1/19.  Baricitinib/Actemra: Discussed with patient.  Currently agreeable for both baricitinib as well as Actemra.  Not contraindicated despite history of lymphoma and chemotherapy per discussion between prior provider and Dr. Irene Limbo patient's oncologist.  Antibiotics: Not indicated right now DVT Prophylaxis: enoxaparin (LOVENOX) injection 40 mg Start: 10/26/20 1715 Patient has significantly elevated right diaphragm which likely is contributing to his hypoxia. Procalcitonin level negative. Prone positioning and incentive spirometer use recommended. Current goal is to continue IV steroids for another 24 hours to ensure improvement on lower dose.  The treatment plan and use of medications and known side effects were discussed with patient/family. It was clearly explained  that complete risks and long-term side effects are unknown. Patient/family agree with the treatment plan.    2. history of DLBCL Per discussion between oncology and prior provider No contraindication for providing Actemra to the patient. Continue outpatient follow-up.  3.  Abnormal hemoglobin, lab error, no anemia Patient had an hemoglobin level of 6.  This was on Adderall.  No anemia.  4. Essential hypertension Chronic HFpEF Blood pressure stable without any medication. Continue to hold antihypertensive regimen. Also hold diuresis. Echocardiogram shows preserved EF.   5. mild hyperkalemia-resolved. Monitor for now.  6. HLD Continue statin  7. Leucocytosis Due to steroids, no stress.   8. Severe constipation  Continue aggressive BM.   Diet: Regular diet DVT Prophylaxis:   enoxaparin (LOVENOX) injection 40 mg Start: 10/26/20 1715   Advance goals of care discussion: Full code  Family Communication: no family was present at bedside, at the time of interview.  Discussed on the phone with the family.  On 1/17. Unable to discuss with the family on 1/18.  Disposition:  Status is: Inpatient  Remains inpatient appropriate because:IV treatments appropriate due to intensity of illness or inability to take PO and Inpatient level of care appropriate due to severity of illness   Dispo: The patient is from: Home              Anticipated d/c is to: Home              Anticipated d/c date is: 2 days              Patient currently is not medically stable to d/c.  Subjective: Continues to have shortness of breath continues to have cough.  Also reports fatigue.  Bowel movement x1 yesterday.  No blood in the stool.  Physical Exam:  General: Appear in mild distress, no Rash; Oral Mucosa Clear, moist. no Abnormal Neck Mass Or lumps, Conjunctiva normal  Cardiovascular: S1 and S2 Present, no Murmur, Respiratory: increased respiratory effort, Bilateral Air entry present and bilateral   Crackles, no wheezes Abdomen: Bowel Sound present, Soft and no tenderness Extremities: trace Pedal edema Neurology: alert and oriented to time, place, and person affect appropriate. no new focal deficit Gait not checked due to patient safety concerns  Vitals:   11/03/20 0608 11/03/20 0621 11/03/20 0906 11/03/20 1434  BP: 110/69   125/89  Pulse: 66   82  Resp: 20   15  Temp: 98.3 F (36.8 C)   98 F (36.7 C)  TempSrc: Oral   Oral  SpO2: 93%  95% 92%  Weight:  79.9 kg    Height:        Intake/Output Summary (Last 24 hours) at 11/03/2020 1554 Last data filed at 11/03/2020 1300 Gross per 24 hour  Intake 240 ml  Output 600 ml  Net -360 ml   Filed Weights   11/01/20 0455 11/02/20 0513 11/03/20 0621  Weight: 82.8 kg 80.8 kg 79.9 kg    Data Reviewed: I have personally reviewed and interpreted daily labs, tele strips, imaging. I reviewed all nursing notes, pharmacy notes, vitals, pertinent old records I have discussed plan of care as described above with RN and patient/family.  CBC: Recent Labs  Lab 10/30/20 0150 10/31/20 0302 11/01/20 0649 11/02/20 0521 11/03/20 0621  WBC 10.0 10.0 9.4 11.3* 15.2*  NEUTROABS 8.1* 7.7 7.1 10.1* 13.5*  HGB 13.3 13.4 14.2 13.5 12.9*  HCT 38.6* 41.5 42.6 40.7 41.0  MCV 96.7 98.1 97.7 99.3 99.5  PLT 256 252 230 242 427   Basic Metabolic Panel: Recent Labs  Lab 10/28/20 0230 10/29/20 0211 10/30/20 0150 10/31/20 0302 11/01/20 0649 11/02/20 0521 11/03/20 0621  NA 135 136 134* 135 142 140 138  K 4.8 5.5* 4.7 4.2 4.7 4.6 4.6  CL 100 100 98 95* 96* 99 98  CO2 26 26 26 30  35* 32 31  GLUCOSE 177* 186* 172* 131* 115* 155* 147*  BUN 14 20 19  24* 27* 25* 26*  CREATININE 0.69 0.91 0.77 0.84 0.96 0.85 0.77  CALCIUM 8.1* 8.5* 8.5* 8.7* 8.7* 8.4* 8.6*  MG 2.4 2.3 2.2 2.1  --   --   --     Studies: No results found.  Scheduled Meds: . vitamin C  500 mg Oral Daily  . benzonatate  100 mg Oral TID  . Chlorhexidine Gluconate Cloth  6 each  Topical Daily  . chlorpheniramine-HYDROcodone  5 mL Oral Q12H  . enoxaparin (LOVENOX) injection  40 mg Subcutaneous Q24H  . feeding supplement  237 mL Oral BID BM  . fluticasone  2 spray Each Nare Daily  . guaiFENesin  600 mg Oral BID  . lactulose  10 g Oral Daily  . methylPREDNISolone (SOLU-MEDROL) injection  40 mg Intravenous BID  . oxymetazoline  1 spray Each Nare BID  . polyethylene glycol  17 g Oral Daily  . senna-docusate  1 tablet Oral BID  . simvastatin  40 mg Oral q1800  . zinc sulfate  220 mg Oral Daily   Continuous Infusions: PRN Meds: acetaminophen, albuterol, diphenhydrAMINE, guaiFENesin-dextromethorphan, ondansetron **OR** ondansetron (ZOFRAN) IV, polyethylene glycol, sodium chloride flush  Time spent: 35 minutes  Author: Berle Mull, MD [facility] 11/03/2020 3:54 PM  To reach On-call, see care teams to locate the attending and reach out via [email or web address]. Between  7PM-7AM, please contact night-coverage If you still have difficulty reaching the attending provider, please page the [facility] (Director on Call) for [facility] on amion for assistance.

## 2020-11-03 NOTE — Plan of Care (Signed)

## 2020-11-03 NOTE — Progress Notes (Signed)
Physical Therapy Treatment Patient Details Name: Antonio [name] MRN: [id number] DOB: [birth year] Today's Date: 11/03/2020    History of Present Illness Pt is an 80 year old male who is vaccinated against COVID-19 with past medical history of left bundle branch block, essential tremor, prior melanoma, right testicular large B-cell lymphoma , hypertension, hyperlipidemia, MGUS who returned to the hospital on 10/26/20 with acute respiratory failure with hypoxia. Pt was discharged on 10/23/20 after admission for COVID-19.    PT Comments    Pt doing well with mobility. Still requiring supplemental O2.  Pt on 5L O2 at rest with SpO2 95%. Amb on 6L O2 with SpO2 89-90%.   Follow Up Recommendations  Home health PT;Supervision/Assistance - 24 hour     Equipment Recommendations  None recommended by PT    Recommendations for Other Services       Precautions / Restrictions Precautions Precautions: Fall    Mobility  Bed Mobility Overal bed mobility: Modified Independent Bed Mobility: Supine to Sit;Sit to Supine     Supine to sit: Modified independent (Device/Increase time);HOB elevated Sit to supine: Modified independent (Device/Increase time);HOB elevated      Transfers Overall transfer level: Needs assistance Equipment used: Rolling walker (2 wheeled) Transfers: Sit to/from Stand Sit to Stand: Supervision         General transfer comment: supervision for safety/lines  Ambulation/Gait Ambulation/Gait assistance: Supervision Gait Distance (Feet): 150 Feet Assistive device: Rolling walker (2 wheeled) Gait Pattern/deviations: Step-through pattern;Decreased stride length;Trunk flexed Gait velocity: decr Gait velocity interpretation: <1.31 ft/sec, indicative of household ambulator General Gait Details: Assist for safety and to monitor O2 levels. Pt walked laps back and forth in room due to room confinement   Stairs             Wheelchair Mobility    Modified Rankin  (Stroke Patients Only)       Balance Overall balance assessment: Needs assistance Sitting-balance support: No upper extremity supported Sitting balance-Leahy Scale: Good     Standing balance support: Single extremity supported;During functional activity Standing balance-Leahy Scale: Poor Standing balance comment: UE support                            Cognition Arousal/Alertness: Awake/alert Behavior During Therapy: WFL for tasks assessed/performed Overall Cognitive Status: No family/caregiver present to determine baseline cognitive functioning Area of Impairment: Memory                     Memory: Decreased short-term memory                Exercises      General Comments General comments (skin integrity, edema, etc.): Pt on 5L O2 at rest with SpO2 95%. Amb on 6L O2 with SpO2 89-90%.      Pertinent Vitals/Pain Pain Assessment: No/denies pain    Home Living                      Prior Function            PT Goals (current goals can now be found in the care plan section) Acute Rehab PT Goals Patient Stated Goal: breathe better Progress towards PT goals: Progressing toward goals    Frequency    Min 3X/week      PT Plan Current plan remains appropriate    Co-evaluation              AM-PAC PT "6 Clicks" Mobility  Outcome Measure  Help needed turning from your back to your side while in a flat bed without using bedrails?: None Help needed moving from lying on your back to sitting on the side of a flat bed without using bedrails?: None Help needed moving to and from a bed to a chair (including a wheelchair)?: A Little Help needed standing up from a chair using your arms (e.g., wheelchair or bedside chair)?: A Little Help needed to walk in hospital room?: A Little Help needed climbing 3-5 steps with a railing? : A Little 6 Click Score: 20    End of Session   Activity Tolerance: Patient tolerated treatment  well Patient left: in bed;with call bell/phone within reach Nurse Communication: Mobility status PT Visit Diagnosis: Other abnormalities of gait and mobility (R26.89);Muscle weakness (generalized) (M62.81)     Time: 9767-3419 PT Time Calculation (min) (ACUTE ONLY): 21 min  Charges:  $Gait Training: 8-22 mins                     [place] Pager [phone] [place] 11/03/2020, 3:31 PM

## 2020-11-04 DIAGNOSIS — I10 Essential (primary) hypertension: Secondary | ICD-10-CM | POA: Diagnosis not present

## 2020-11-04 DIAGNOSIS — J1282 Pneumonia due to coronavirus disease 2019: Secondary | ICD-10-CM | POA: Diagnosis not present

## 2020-11-04 DIAGNOSIS — J9601 Acute respiratory failure with hypoxia: Secondary | ICD-10-CM | POA: Diagnosis not present

## 2020-11-04 DIAGNOSIS — U071 COVID-19: Secondary | ICD-10-CM | POA: Diagnosis not present

## 2020-11-04 LAB — CBC WITH DIFFERENTIAL/PLATELET
Abs Immature Granulocytes: 0.42 10*3/uL — ABNORMAL HIGH (ref 0.00–0.07)
Basophils Absolute: 0.1 10*3/uL (ref 0.0–0.1)
Basophils Relative: 0 %
Eosinophils Absolute: 0 10*3/uL (ref 0.0–0.5)
Eosinophils Relative: 0 %
HCT: 38.5 % — ABNORMAL LOW (ref 39.0–52.0)
Hemoglobin: 13 g/dL (ref 13.0–17.0)
Immature Granulocytes: 3 %
Lymphocytes Relative: 3 %
Lymphs Abs: 0.3 10*3/uL — ABNORMAL LOW (ref 0.7–4.0)
MCH: 32.9 pg (ref 26.0–34.0)
MCHC: 33.8 g/dL (ref 30.0–36.0)
MCV: 97.5 fL (ref 80.0–100.0)
Monocytes Absolute: 0.6 10*3/uL (ref 0.1–1.0)
Monocytes Relative: 5 %
Neutro Abs: 10.8 10*3/uL — ABNORMAL HIGH (ref 1.7–7.7)
Neutrophils Relative %: 89 %
Platelets: 225 10*3/uL (ref 150–400)
RBC: 3.95 MIL/uL — ABNORMAL LOW (ref 4.22–5.81)
RDW: 12.6 % (ref 11.5–15.5)
WBC: 12.2 10*3/uL — ABNORMAL HIGH (ref 4.0–10.5)
nRBC: 0 % (ref 0.0–0.2)

## 2020-11-04 LAB — COMPREHENSIVE METABOLIC PANEL
ALT: 43 U/L (ref 0–44)
AST: 17 U/L (ref 15–41)
Albumin: 2.5 g/dL — ABNORMAL LOW (ref 3.5–5.0)
Alkaline Phosphatase: 43 U/L (ref 38–126)
Anion gap: 8 (ref 5–15)
BUN: 26 mg/dL — ABNORMAL HIGH (ref 8–23)
CO2: 31 mmol/L (ref 22–32)
Calcium: 8.7 mg/dL — ABNORMAL LOW (ref 8.9–10.3)
Chloride: 95 mmol/L — ABNORMAL LOW (ref 98–111)
Creatinine, Ser: 0.71 mg/dL (ref 0.61–1.24)
GFR, Estimated: >60 mL/min (ref >60)
Glucose, Bld: 164 mg/dL — ABNORMAL HIGH (ref 70–99)
Potassium: 4.8 mmol/L (ref 3.5–5.1)
Sodium: 134 mmol/L — ABNORMAL LOW (ref 135–145)
Total Bilirubin: 0.8 mg/dL (ref 0.3–1.2)
Total Protein: 5.3 g/dL — ABNORMAL LOW (ref 6.5–8.1)

## 2020-11-04 LAB — D-DIMER, QUANTITATIVE: D-Dimer, Quant: 0.79 ug/mL-FEU — ABNORMAL HIGH (ref 0.00–0.50)

## 2020-11-04 LAB — C-REACTIVE PROTEIN: CRP: 0.7 mg/dL (ref <1.0)

## 2020-11-04 MED ORDER — MOMETASONE FURO-FORMOTEROL FUM 100-5 MCG/ACT IN AERO
2.0000 | INHALATION_SPRAY | Freq: Two times a day (BID) | RESPIRATORY_TRACT | Status: DC
  Administered 2020-11-04 – 2020-11-11 (×14): 2 via RESPIRATORY_TRACT
  Filled 2020-11-04 (×2): qty 8.8

## 2020-11-04 MED ORDER — ENSURE ENLIVE PO LIQD
237.0000 mL | Freq: Three times a day (TID) | ORAL | Status: DC
  Administered 2020-11-04 – 2020-11-11 (×18): 237 mL via ORAL
  Filled 2020-11-04 (×3): qty 237

## 2020-11-04 MED ORDER — FAMOTIDINE 20 MG PO TABS
20.0000 mg | ORAL_TABLET | Freq: Every day | ORAL | Status: DC
  Administered 2020-11-04 – 2020-11-11 (×8): 20 mg via ORAL
  Filled 2020-11-04 (×8): qty 1

## 2020-11-04 MED ORDER — FUROSEMIDE 10 MG/ML IJ SOLN
40.0000 mg | Freq: Once | INTRAMUSCULAR | Status: AC
  Administered 2020-11-04: 40 mg via INTRAVENOUS
  Filled 2020-11-04: qty 4

## 2020-11-04 MED ORDER — ADULT MULTIVITAMIN W/MINERALS CH
1.0000 | ORAL_TABLET | Freq: Every day | ORAL | Status: DC
  Administered 2020-11-04 – 2020-11-11 (×8): 1 via ORAL
  Filled 2020-11-04 (×8): qty 1

## 2020-11-04 NOTE — Progress Notes (Signed)
Initial Nutrition Assessment  DOCUMENTATION CODES:   Not applicable  INTERVENTION:   -Increase Ensure Enlive po to TID, each supplement provides 350 kcal and 20 grams of protein -MVI with minerals daily -Magic cup TID with meals, each supplement provides 290 kcal and 9 grams of protein -Recommend liberalizing diet to regular  NUTRITION DIAGNOSIS:   Increased nutrient needs related to acute illness (COVID-19) as evidenced by estimated needs.  GOAL:   Patient will meet greater than or equal to 90% of their needs  MONITOR:   PO intake,Supplement acceptance,Diet advancement,Labs,Weight trends,Skin,I & O's  REASON FOR ASSESSMENT:   Malnutrition Screening Tool    ASSESSMENT:   80 year old male with past medical history of B-cell lymphoma, hypertension, hyperlipidemia, essential tremor and recent diagnosis of COVID-19 infection who presents to [facility] emergency department due to progressively worsening lethargy confusion shortness of breath and hypoxia.  Pt admitted with acute respiratory failure secondary to COVID-19.   Reviewed I/O's: -110 ml x 24 hours and 3.2 L since admission  UOP: 350 ml x 24 hours  Pt unavailable at times of attempted contact x 2.   Pt with variable intake. Noted meal completion 20-70%. Pt has Ensure Enlive supplements ordered, which he is consuming.   Reviewed wt hx; pt has experienced a 8.6% wt loss over the past 2 months, which is significant for time frame.   Reached out to MD to discuss liberalizing diet to improve oral intake.   Pt with increased nutritional needs due to COVID-19 and would greatly benefit from oral nutrition supplements. Pt also at high risk for malnutrition, but unable to identify at this time.   Medications reviewed and include vitamin C, lactulose, solu-medrol, miralax, senokot, and zinc sulfate.   Labs reviewed: Na: 134, CBGS: 200.  Diet Order:   Diet Order            Diet Heart Room service appropriate?  Yes; Fluid consistency: Thin  Diet effective now                 EDUCATION NEEDS:   No education needs have been identified at this time  Skin:  Skin Assessment: Reviewed RN Assessment  Last BM:  11/04/20  Height:   Ht Readings from Last 1 Encounters:  10/26/20 6' (1.829 m)    Weight:   Wt Readings from Last 1 Encounters:  11/04/20 80.2 kg    Ideal Body Weight:  80.9 kg  BMI:  Body mass index is 23.99 kg/m.  Estimated Nutritional Needs:   Kcal:  2200-2400  Protein:  120-135 grams  Fluid:  > 2 L    Loistine Chance, RD, LDN, [place] Registered Dietitian II Certified Diabetes Care and Education Specialist Please refer to [facility] for RD and/or RD on-call/weekend/after hours pager

## 2020-11-04 NOTE — Plan of Care (Signed)

## 2020-11-04 NOTE — Progress Notes (Signed)
Occupational Therapy Treatment Patient Details Name: Antonio [name] MRN: [id number] DOB: 1941-04-05 Today's Date: 11/04/2020    History of present illness Pt is an 80 year old male who is vaccinated against COVID-19 with past medical history of left bundle branch block, essential tremor, prior melanoma, right testicular large B-cell lymphoma , hypertension, hyperlipidemia, MGUS who returned to the hospital on 10/26/20 with acute respiratory failure with hypoxia. Pt was discharged on 10/23/20 after admission for COVID-19.   OT comments  Pt received seated on BSC attempting to complete pericare. Pt required min guard and set- up assist of wash cloths to complete posterior pericare mostly for line mgmt and safety. Pt on 4L HFNC with sats WFL at start of session. Pt currently requires MIN A for UB/LB ADLs. Pt completed x6 laps in room needing a break after 3 laps. Pt with brief desat to 88% with HR increasing to 140 bpm needing a seated rest break to return to [facility]. Pt would continue to benefit from skilled occupational therapy while admitted and after d/c to address the below listed limitations in order to improve overall functional mobility and facilitate independence with BADL participation. DC plan remains appropriate, will follow acutely per POC.    Follow Up Recommendations  Home health OT;Supervision/Assistance - 24 hour    Equipment Recommendations  None recommended by OT    Recommendations for Other Services      Precautions / Restrictions Precautions Precautions: Fall Restrictions Weight Bearing Restrictions: No       Mobility Bed Mobility               General bed mobility comments: pt recieved ob BSC and returned to EOB  Transfers Overall transfer level: Needs assistance Equipment used: Rolling walker (2 wheeled) Transfers: Sit to/from [facility] Sit to Stand: Min guard Stand pivot transfers: Min guard       General transfer comment: minguard mostly  for safety and line mgmt    Balance Overall balance assessment: Needs assistance Sitting-balance support: No upper extremity supported Sitting balance-Leahy Scale: Good     Standing balance support: Single extremity supported;During functional activity Standing balance-Leahy Scale: Poor Standing balance comment: at least one UE supported during ADLs                           ADL either performed or assessed with clinical judgement   ADL Overall ADL's : Needs assistance/impaired             Lower Body Bathing: Min guard;Set up;Sit to/from stand Lower Body Bathing Details (indicate cue type and reason): simulated via pericare Upper Body Dressing : Minimal assistance;Sitting Upper Body Dressing Details (indicate cue type and reason): to don new gown Lower Body Dressing: Minimal assistance;Sit to/from stand Lower Body Dressing Details (indicate cue type and reason): to don new underwear from EOB Toilet Transfer: Min guard;Ambulation;RW;Stand-pivot;BSC Toilet Transfer Details (indicate cue type and reason): pt completed stand pivot transfer from BSC>EOB with no AD and min guard for balance with pt also able to complete functional mobility in room as simulated toilet transfer with RW and MIN guard assist Toileting- Clothing Manipulation and Hygiene: Set up;Sit to/from stand;Min guard       Functional mobility during ADLs: Min guard;Rolling walker General ADL Comments: pt continues to present with dyspnea with exertion on 4L HFNC but sats rebound quicky with seated rest break, pt very motivated completing toileting tasks, LB dressing and functional mobility  Vision       Perception     Praxis      Cognition Arousal/Alertness: Awake/alert Behavior During Therapy: WFL for tasks assessed/performed Overall Cognitive Status: Within Functional Limits for tasks assessed                                 General Comments: overall WFL for simple mobility  tasks        Exercises Other Exercises Other Exercises: encouraged pt to work on functional sit<>stands from EOB, seated marches LAQ, and punches to facilitate increase activity tolerance   Shoulder Instructions       General Comments pt on 4L HFNC during session with sats briefly dropping to 88% but quickly rebound with seated rest break, pt with good awareness into deficits able to state appropriate O2 sat, HR increase to 140 bpm with mobility tasks    Pertinent Vitals/ Pain       Pain Assessment: No/denies pain  Home Living                                          Prior Functioning/Environment              Frequency  Min 2X/week        Progress Toward Goals  OT Goals(current goals can now be found in the care plan section)  Progress towards OT goals: Progressing toward goals  Acute Rehab OT Goals Patient Stated Goal: breathe better OT Goal Formulation: With patient Time For Goal Achievement: 11/12/20 Potential to Achieve Goals: Good  Plan Discharge plan remains appropriate;Frequency remains appropriate    Co-evaluation                 AM-PAC OT "6 Clicks" Daily Activity     Outcome Measure   Help from another person eating meals?: None Help from another person taking care of personal grooming?: A Little Help from another person toileting, which includes using toliet, bedpan, or urinal?: A Little Help from another person bathing (including washing, rinsing, drying)?: A Little Help from another person to put on and taking off regular upper body clothing?: None Help from another person to put on and taking off regular lower body clothing?: A Little 6 Click Score: 20    End of Session Equipment Utilized During Treatment: Rolling walker;Other (comment);Oxygen (4L HFNC, BSC)  OT Visit Diagnosis: Unsteadiness on feet (R26.81);Cognitive communication deficit (R41.841)   Activity Tolerance Patient tolerated treatment well   Patient  Left in bed;with call bell/phone within reach;Other (comment) (sitting EOB)   Nurse Communication Mobility status;Other (comment) (pt had BM)        Time: 4132-4401 OT Time Calculation (min): 29 min  Charges: OT General Charges $OT Visit: 1 Visit OT Treatments $Self Care/Home Management : 23-37 mins  Harley Alto., COTA/L Acute Rehabilitation Services [phone]    Precious Haws 11/04/2020, 4:27 PM

## 2020-11-04 NOTE — Progress Notes (Signed)
[facility] Progress Note  Patient: Antonio [name]    YWV:[id number]  DOA: 10/26/2020     Date of Service: the patient was seen and examined on 11/04/2020  Brief hospital course: Past medical history of B-cell lymphoma, chemotherapy due in 2000, recent COVID-19 infection. Presents again to the hospital with worsening shortness of breath found to have worsening hypoxia as well as likely inflammation worsening from COVID-19 pneumonia.   Assessment and Plan:  Acute Hypoxic Resp. Failure/Pneumonia due to COVID-19  Recent Labs  Lab 10/31/20 0302 11/01/20 0649 11/02/20 0521 11/03/20 0621 11/04/20 0510  DDIMER 0.78* 0.94* 0.84* 0.76* 0.79*  CRP 1.3* 2.8* 3.1* 1.3* 0.7  ALT 51* 50* 45* 39 43  PROCALCITON  --   --  <0.10 <0.10  --     Objective findings: Fever: Noted to be afebrile Oxygen requirements: High flow nasal cannula at 4 L/min.  Saturating in the early 90s.  COVID 19 Therapeutics: Antibacterials: None Remdesivir: Completed 5-day course during previous hospitalization Steroids: On Solu-Medrol Diuretics: None currently Inhaled Steroids: Initiate inhaled steroids Actemra/Baricitinib: Not given yet PUD Prophylaxis: Initiate Pepcid DVT Prophylaxis:  Lovenox  Tested positive on 1/4  From respiratory standpoint patient seems to be slowly improving.  He was requiring 7 to 8 L of oxygen previously.  Currently down to 4 L.  Continue steroids.  D-dimer only minimally elevated.  CRP down to normal.  Procalcitonin was less than 0.1.  Incentive spirometry mobilization.  Give 1 dose of furosemide today.  The treatment plan and use of medications and known side effects were discussed with patient/family. Some of the medications used are based on case reports/anecdotal data.  All other medications being used in the management of COVID-19 based on limited study data.  Complete risks and long-term side effects are unknown, however in the best clinical judgment they seem to be of some  benefit.  Patient/family wanted to proceed with treatment options provided.  History of B-cell lymphoma  Outpatient follow-up with medical oncology  Essential hypertension/Chronic HFpEF Blood pressure is reasonably well controlled.  Continue to monitor.  Echocardiogram shows preserved EF.   Mild hyperkalemia Resolved  For lipidemia Continue statin  Leucocytosis Due to steroids   Severe constipation  Continue with bowel regimen.   DVT Prophylaxis:   enoxaparin (LOVENOX) injection 40 mg Start: 10/26/20 1715   Advance goals of care discussion: Full code  Family Communication: No family at bedside.  Will call them later today.  Disposition: Home health recommended by PT  Status is: Inpatient  Remains inpatient appropriate because:IV treatments appropriate due to intensity of illness or inability to take PO and Inpatient level of care appropriate due to severity of illness   Dispo: The patient is from: Home              Anticipated d/c is to: Home              Anticipated d/c date is: 2 days              Patient currently is not medically stable to d/c.    Subjective: Continues to have shortness of breath with exertion.  Also has cough.  Denies any chest pain nausea or vomiting.   Physical Exam:  General appearance: Awake alert.  In no distress Resp: Noted to be tachypneic without use of accessory muscles.  Scattered wheezing.  Few crackles at the bases. Cardio: S1-S2 is normal regular.  No S3-S4.  No rubs murmurs or bruit GI: Abdomen is  soft.  Nontender nondistended.  Bowel sounds are present normal.  No masses organomegaly Extremities: No edema.  Full range of motion of lower extremities. Neurologic: Alert and oriented x3.  No focal neurological deficits.    Vitals:   11/04/20 0046 11/04/20 0458 11/04/20 0521 11/04/20 0821  BP: 122/70 120/66  125/86  Pulse: 65 (!) 56  74  Resp: 18 18  18   Temp: 98 F (36.7 C) 97.9 F (36.6 C)  98.7 F (37.1 C)  TempSrc:  Oral Oral  Oral  SpO2: 96% 93%  93%  Weight:   80.2 kg   Height:        Intake/Output Summary (Last 24 hours) at 11/04/2020 1245 Last data filed at 11/04/2020 1019 Gross per 24 hour  Intake 240 ml  Output 825 ml  Net -585 ml   Filed Weights   11/02/20 0513 11/03/20 0621 11/04/20 0521  Weight: 80.8 kg 79.9 kg 80.2 kg    Data Reviewed:  CBC: Recent Labs  Lab 10/31/20 0302 11/01/20 0649 11/02/20 0521 11/03/20 0621 11/04/20 0510  WBC 10.0 9.4 11.3* 15.2* 12.2*  NEUTROABS 7.7 7.1 10.1* 13.5* 10.8*  HGB 13.4 14.2 13.5 12.9* 13.0  HCT 41.5 42.6 40.7 41.0 38.5*  MCV 98.1 97.7 99.3 99.5 97.5  PLT 252 230 242 247 161   Basic Metabolic Panel: Recent Labs  Lab 10/29/20 0211 10/30/20 0150 10/31/20 0302 11/01/20 0649 11/02/20 0521 11/03/20 0621 11/04/20 0510  NA 136 134* 135 142 140 138 134*  K 5.5* 4.7 4.2 4.7 4.6 4.6 4.8  CL 100 98 95* 96* 99 98 95*  CO2 26 26 30  35* 32 31 31  GLUCOSE 186* 172* 131* 115* 155* 147* 164*  BUN 20 19 24* 27* 25* 26* 26*  CREATININE 0.91 0.77 0.84 0.96 0.85 0.77 0.71  CALCIUM 8.5* 8.5* 8.7* 8.7* 8.4* 8.6* 8.7*  MG 2.3 2.2 2.1  --   --   --   --     Studies: No results found.  Scheduled Meds: . vitamin C  500 mg Oral Daily  . benzonatate  100 mg Oral TID  . Chlorhexidine Gluconate Cloth  6 each Topical Daily  . chlorpheniramine-HYDROcodone  5 mL Oral Q12H  . enoxaparin (LOVENOX) injection  40 mg Subcutaneous Q24H  . feeding supplement  237 mL Oral TID BM  . fluticasone  2 spray Each Nare Daily  . guaiFENesin  600 mg Oral BID  . lactulose  10 g Oral Daily  . methylPREDNISolone (SOLU-MEDROL) injection  40 mg Intravenous BID  . multivitamin with minerals  1 tablet Oral Daily  . oxymetazoline  1 spray Each Nare BID  . polyethylene glycol  17 g Oral Daily  . senna-docusate  1 tablet Oral BID  . simvastatin  40 mg Oral q1800  . zinc sulfate  220 mg Oral Daily   Continuous Infusions: PRN Meds: acetaminophen, albuterol,  diphenhydrAMINE, guaiFENesin-dextromethorphan, ondansetron **OR** ondansetron (ZOFRAN) IV, polyethylene glycol, sodium chloride flush   Author: Bonnielee Haff  [facility] 11/04/2020 12:45 PM  To reach On-call, see care teams to locate the attending and reach out via [email or web address]. Between 7PM-7AM, please contact night-coverage If you still have difficulty reaching the attending provider, please page the [facility] (Director on Call) for [facility] on amion for assistance.

## 2020-11-05 LAB — COMPREHENSIVE METABOLIC PANEL
ALT: 42 U/L (ref 0–44)
AST: 17 U/L (ref 15–41)
Albumin: 2.8 g/dL — ABNORMAL LOW (ref 3.5–5.0)
Alkaline Phosphatase: 52 U/L (ref 38–126)
Anion gap: 11 (ref 5–15)
BUN: 32 mg/dL — ABNORMAL HIGH (ref 8–23)
CO2: 32 mmol/L (ref 22–32)
Calcium: 9 mg/dL (ref 8.9–10.3)
Chloride: 94 mmol/L — ABNORMAL LOW (ref 98–111)
Creatinine, Ser: 0.85 mg/dL (ref 0.61–1.24)
GFR, Estimated: >60 mL/min (ref >60)
Glucose, Bld: 291 mg/dL — ABNORMAL HIGH (ref 70–99)
Potassium: 4.5 mmol/L (ref 3.5–5.1)
Sodium: 137 mmol/L (ref 135–145)
Total Bilirubin: 0.6 mg/dL (ref 0.3–1.2)
Total Protein: 5.7 g/dL — ABNORMAL LOW (ref 6.5–8.1)

## 2020-11-05 LAB — CBC WITH DIFFERENTIAL/PLATELET
Abs Immature Granulocytes: 0.6 10*3/uL — ABNORMAL HIGH (ref 0.00–0.07)
Basophils Absolute: 0.1 10*3/uL (ref 0.0–0.1)
Basophils Relative: 1 %
Eosinophils Absolute: 0 10*3/uL (ref 0.0–0.5)
Eosinophils Relative: 0 %
HCT: 42 % (ref 39.0–52.0)
Hemoglobin: 14.1 g/dL (ref 13.0–17.0)
Immature Granulocytes: 5 %
Lymphocytes Relative: 3 %
Lymphs Abs: 0.4 10*3/uL — ABNORMAL LOW (ref 0.7–4.0)
MCH: 32.9 pg (ref 26.0–34.0)
MCHC: 33.6 g/dL (ref 30.0–36.0)
MCV: 97.9 fL (ref 80.0–100.0)
Monocytes Absolute: 0.7 10*3/uL (ref 0.1–1.0)
Monocytes Relative: 5 %
Neutro Abs: 11.4 10*3/uL — ABNORMAL HIGH (ref 1.7–7.7)
Neutrophils Relative %: 86 %
Platelets: 224 10*3/uL (ref 150–400)
RBC: 4.29 MIL/uL (ref 4.22–5.81)
RDW: 12.9 % (ref 11.5–15.5)
WBC: 13.2 10*3/uL — ABNORMAL HIGH (ref 4.0–10.5)
nRBC: 0 % (ref 0.0–0.2)

## 2020-11-05 MED ORDER — PREDNISONE 20 MG PO TABS
40.0000 mg | ORAL_TABLET | Freq: Every day | ORAL | Status: DC
  Administered 2020-11-06 – 2020-11-08 (×3): 40 mg via ORAL
  Filled 2020-11-05 (×4): qty 2

## 2020-11-05 MED ORDER — FUROSEMIDE 10 MG/ML IJ SOLN
20.0000 mg | Freq: Once | INTRAMUSCULAR | Status: AC
  Administered 2020-11-05: 20 mg via INTRAVENOUS
  Filled 2020-11-05: qty 2

## 2020-11-05 NOTE — Progress Notes (Signed)
Physical Therapy Treatment Patient Details Name: Antonio [name] MRN: [id number] DOB: 22-Aug-1941 Today's Date: 11/05/2020    History of Present Illness Pt is an 80 year old male who is vaccinated against COVID-19 with past medical history of left bundle branch block, essential tremor, prior melanoma, right testicular large B-cell lymphoma , hypertension, hyperlipidemia, MGUS who returned to the hospital on 10/26/20 with acute respiratory failure with hypoxia. Pt was discharged on 10/23/20 after admission for COVID-19.    PT Comments    Pt continues to show good progress with mobility and balance. Was able to amb without walker with supervision. SpO2 90% on 4L O2.    Follow Up Recommendations  Home health PT;Supervision/Assistance - 24 hour     Equipment Recommendations  None recommended by PT    Recommendations for Other Services       Precautions / Restrictions Precautions Precautions: Fall    Mobility  Bed Mobility Overal bed mobility: Modified Independent Bed Mobility: Supine to Sit;Sit to Supine     Supine to sit: Modified independent (Device/Increase time);HOB elevated Sit to supine: Modified independent (Device/Increase time);HOB elevated      Transfers Overall transfer level: Needs assistance Equipment used: Rolling walker (2 wheeled) Transfers: Sit to/from Stand Sit to Stand: Supervision         General transfer comment: supervision for safety/lines  Ambulation/Gait Ambulation/Gait assistance: Supervision Gait Distance (Feet): 200 Feet Assistive device: Rolling walker (2 wheeled);None Gait Pattern/deviations: Step-through pattern;Decreased stride length Gait velocity: decr Gait velocity interpretation: 1.31 - 2.62 ft/sec, indicative of limited community ambulator General Gait Details: Pt amb initially with rolling walker and then was able to amb without assistive deviceAssist for safety and to monitor O2 levels. Pt walked laps back and forth in room due to  room confinement   Stairs             Wheelchair Mobility    Modified Rankin (Stroke Patients Only)       Balance Overall balance assessment: Needs assistance Sitting-balance support: No upper extremity supported Sitting balance-Leahy Scale: Good     Standing balance support: During functional activity;No upper extremity supported Standing balance-Leahy Scale: Fair                              Cognition Arousal/Alertness: Awake/alert Behavior During Therapy: WFL for tasks assessed/performed Overall Cognitive Status: No family/caregiver present to determine baseline cognitive functioning Area of Impairment: Memory                     Memory: Decreased short-term memory                Exercises      General Comments General comments (skin integrity, edema, etc.): Amb on 4L O2 with SpO2 90% and HR 117.      Pertinent Vitals/Pain Pain Assessment: No/denies pain    Home Living                      Prior Function            PT Goals (current goals can now be found in the care plan section) Acute Rehab PT Goals Patient Stated Goal: breathe better Progress towards PT goals: Progressing toward goals    Frequency    Min 3X/week      PT Plan Current plan remains appropriate    Co-evaluation  AM-PAC PT "6 Clicks" Mobility   Outcome Measure  Help needed turning from your back to your side while in a flat bed without using bedrails?: None Help needed moving from lying on your back to sitting on the side of a flat bed without using bedrails?: None Help needed moving to and from a bed to a chair (including a wheelchair)?: A Little Help needed standing up from a chair using your arms (e.g., wheelchair or bedside chair)?: None Help needed to walk in hospital room?: A Little Help needed climbing 3-5 steps with a railing? : A Little 6 Click Score: 21    End of Session Equipment Utilized During  Treatment: Oxygen Activity Tolerance: Patient tolerated treatment well Patient left: in bed;with call bell/phone within reach;with nursing/sitter in room Nurse Communication: Mobility status PT Visit Diagnosis: Other abnormalities of gait and mobility (R26.89);Muscle weakness (generalized) (M62.81)     Time: 5449-2010 PT Time Calculation (min) (ACUTE ONLY): 20 min  Charges:  $Gait Training: 8-22 mins                     [place] Pager [phone] Office [place] 11/05/2020, 2:24 PM

## 2020-11-05 NOTE — TOC Initial Note (Addendum)
Transition of Care [facility]) - Initial/Assessment Note    Patient Details  Name: Antonio [name] MRN: [id number] Date of Birth: 1941-05-12  [facility]) CM/SW Contact:    Trula Ore, [place] Phone Number: 11/05/2020, 3:46 PM  Clinical Narrative:                  CSW spoke with Minette Headland patients daughter as well as patient and patient and patients daughter is in agreement for patient to go to [facility] SNF. CSW called [facility] and they confirmed they can accept patient back and that he can go to short term rehab there. Patient has received both Covid vaccines as well as his booster vaccine. CSW started insurance authorization for patient.No further questions reported at this time. CSW to continue to follow and assist with discharge planning needs.   Expected Discharge Plan: Skilled Nursing Facility Barriers to Discharge: Continued Medical Work up   Patient Goals and CMS Choice   CMS Medicare.gov Compare Post Acute Care list provided to:: Patient Represenative (must comment) Minette Headland or Santiago Glad) Choice offered to / list presented to : Adult Children Nurse, mental health)  Expected Discharge Plan and Services Expected Discharge Plan: [place] In-house Referral: Clinical Social Work     Living arrangements for the past 2 months: Single Family Home                                      Prior Living Arrangements/Services Living arrangements for the past 2 months: Single Family Home Lives with:: Self,Spouse (Lives at [place]) Patient language and need for interpreter reviewed:: Yes Do you feel safe going back to the place where you live?: No   SNF  Need for Family Participation in Patient Care: Yes (Comment) Care giver support system in place?: Yes (comment)   Criminal Activity/Legal Involvement Pertinent to Current Situation/Hospitalization: No - Comment as needed  Activities of Daily Living Home Assistive Devices/Equipment: Bedside  commode/3-in-1,Eyeglasses,Hearing aid ADL Screening (condition at time of admission) Patient's cognitive ability adequate to safely complete daily activities?: Yes Is the patient deaf or have difficulty hearing?: Yes (hearing aids) Does the patient have difficulty seeing, even when wearing glasses/contacts?: Yes (glasses) Does the patient have difficulty concentrating, remembering, or making decisions?: No Patient able to express need for assistance with ADLs?: Yes Does the patient have difficulty dressing or bathing?: No Independently performs ADLs?: Yes (appropriate for developmental age) Communication: Independent Dressing (OT): Independent Grooming: Independent Feeding: Independent Bathing: Independent Toileting: Needs assistance (related to tele and oxygen) Is this a change from baseline?: Change from baseline, expected to last <3 days In/Out Bed: Needs assistance (related to tele and oxygen) Is this a change from baseline?: Change from baseline, expected to last <3 days Walks in Home: Independent (stand by assist) Is this a change from baseline?: Pre-admission baseline Does the patient have difficulty walking or climbing stairs?: Yes Weakness of Legs: Both Weakness of Arms/Hands: None  Permission Sought/Granted Permission sought to share information with : Case Manager,Family Chief Financial Officer Permission granted to share information with : Yes, Verbal Permission Granted  Share Information with NAME: Santiago Glad  Permission granted to share info w AGENCY: SNF  Permission granted to share info w Relationship: daughter  Permission granted to share info w Contact Information: Santiago Glad [phone]  Emotional Assessment       Orientation: : Oriented to Self,Oriented to Place,Oriented to  Time,Oriented to Situation Alcohol /  Substance Use: Not Applicable Psych Involvement: No (comment)  Admission diagnosis:  Sepsis ([place]) [A41.9] Acute respiratory failure due to  COVID-19 ([place]) [U07.1, J96.00] COVID-19 virus infection [U07.1] Patient Active Problem List   Diagnosis Date Noted  . Lobar pneumonia ([place]) 10/27/2020  . Acute hypoxemic respiratory failure due to COVID-19 ([place]) 10/26/2020  . Mixed hyperlipidemia 10/26/2020  . Acute metabolic encephalopathy 93/23/5573  . Symptomatic anemia 10/20/2020  . Acute respiratory failure with hypoxia ([place]) 10/20/2020  . Pleural effusion 10/20/2020  . Testosterone deficiency 09/09/2020  . S/P orchiectomy 09/09/2020  . Diffuse large B cell lymphoma ([place]) 03/17/2020  . 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 Counseling regarding advance care planning and goals of care 03/17/2020  . Hyperglycemia 09/05/2018  . Mass of soft tissue of left lower extremity 09/05/2018  . Monoclonal gammopathy of unknown significance (MGUS) 09/05/2018  . Macrocytosis without anemia 09/05/2018  . 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 Slow transit constipation 06/21/2017  . Sleep-wake disorder 06/21/2017  . Essential hypertension 06/21/2017  . Pure hypercholesterolemia 06/21/2017  . History of B-cell lymphoma 06/21/2017  . Personal history of malignant melanoma 06/21/2017  . Left carotid artery stenosis 06/21/2017  . DDD (degenerative disc disease), cervical 06/21/2017  . History of kidney stones 06/21/2017  . Tubular adenoma of colon 06/21/2017  . 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  . Vitamin D deficiency 06/21/2017   PCP:  [name], DO Pharmacy:   [id number] - [place], [place] [address] Phone: [phone] Fax: [place], [address] [place] [address] [place] [address] Phone: [phone] Fax: [place], [place] [address] [place] [address] Phone: [phone] Fax:  [phone]  [facility] [place], [place] - [address] [place] [address] Phone: [phone] Fax: [phone]     Social Determinants of Health (SDOH) Interventions    Readmission Risk Interventions No flowsheet data found.

## 2020-11-05 NOTE — NC FL2 (Signed)
[place] LEVEL OF CARE SCREENING TOOL     IDENTIFICATION  Patient Name: Antonio [name] Birthdate: Sep 07, 1941 Sex: male Admission Date (Current Location): 10/26/2020  [facility] and [place] Number:  Herbalist and Address:  [facility] [place], [place] [address] [place], [place] [address]      Provider Number: [id number]  Attending Physician Name and Address:  Bonnielee Haff, MD  Relative Name and Phone Number:  Santiago Glad [phone]    Current Level of Care: Hospital Recommended Level of Care: [place] Prior Approval Number:    Date Approved/Denied:   PASRR Number: [id number]  Discharge Plan: SNF    Current Diagnoses: Patient Active Problem List   Diagnosis Date Noted  . Lobar pneumonia ([place]) 10/27/2020  . Acute hypoxemic respiratory failure due to COVID-19 ([place]) 10/26/2020  . Mixed hyperlipidemia 10/26/2020  . Acute metabolic encephalopathy 44/10/270  . Symptomatic anemia 10/20/2020  . Acute respiratory failure with hypoxia ([place]) 10/20/2020  . Pleural effusion 10/20/2020  . Testosterone deficiency 09/09/2020  . S/P orchiectomy 09/09/2020  . Diffuse large B cell lymphoma ([place]) 03/17/2020  . 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 Counseling regarding advance care planning and goals of care 03/17/2020  . Hyperglycemia 09/05/2018  . Mass of soft tissue of left lower extremity 09/05/2018  . Monoclonal gammopathy of unknown significance (MGUS) 09/05/2018  . Macrocytosis without anemia 09/05/2018  . 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 Slow transit constipation 06/21/2017  . Sleep-wake disorder 06/21/2017  . Essential hypertension 06/21/2017  . Pure hypercholesterolemia 06/21/2017  . History of B-cell lymphoma 06/21/2017  . Personal history of malignant melanoma 06/21/2017  . Left carotid artery stenosis 06/21/2017  . DDD (degenerative disc disease), cervical 06/21/2017  . History of kidney  stones 06/21/2017  . Tubular adenoma of colon 06/21/2017  . 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  . Vitamin D deficiency 06/21/2017    Orientation RESPIRATION BLADDER Height & Weight     Self,Time,Situation,Place  O2 (Nasal Cannula 3 liters) Continent Weight: 176 lb 2.4 oz (79.9 kg) Height:  6' (182.9 cm)  BEHAVIORAL SYMPTOMS/MOOD NEUROLOGICAL BOWEL NUTRITION STATUS      Continent (WDL) Diet (See Discharge Summary)  AMBULATORY STATUS COMMUNICATION OF NEEDS Skin   Limited Assist Verbally Other (Comment) (Dry,Flaky, petechiae chest upper)                       Personal Care Assistance Level of Assistance  Bathing,Feeding,Dressing Bathing Assistance: Limited assistance Feeding assistance: Independent (able to feed self; Cardiac) Dressing Assistance: Limited assistance     Functional Limitations Info  Sight,Hearing,Speech Sight Info: Impaired Hearing Info: Adequate Speech Info: Adequate    SPECIAL CARE FACTORS FREQUENCY  PT (By licensed PT),OT (By licensed OT)     PT Frequency: 5x min weekly OT Frequency: 5x min weekly            Contractures Contractures Info: Not present    Additional Factors Info  Code Status,Allergies Code Status Info: DNR Allergies Info: Latex           Current Medications (11/05/2020):  This is the current hospital active medication list Current Facility-Administered Medications  Medication Dose Route Frequency Provider Last Rate Last Admin  . acetaminophen (TYLENOL) tablet 650 mg  650 mg Oral Q6H PRN Vernelle Emerald, MD   650 mg at 10/28/20 2135  . albuterol (VENTOLIN HFA) 108 (90 Base) MCG/ACT inhaler 2 puff  2 puff Inhalation Q6H  PRN Vernelle Emerald, MD      . ascorbic acid (VITAMIN C) tablet 500 mg  500 mg Oral Daily Shalhoub, Sherryll Burger, MD   500 mg at 11/05/20 [id number]  . benzonatate (TESSALON) capsule 100 mg  100 mg Oral TID Lavina Hamman, MD   100 mg at 11/05/20 [id number]  . Chlorhexidine Gluconate Cloth 2 % PADS 6 each  6 each  Topical Daily Lavina Hamman, MD   6 each at 11/05/20 1009  . chlorpheniramine-HYDROcodone (TUSSIONEX) 10-8 MG/5ML suspension 5 mL  5 mL Oral Q12H Lavina Hamman, MD   5 mL at 11/05/20 0957  . diphenhydrAMINE (BENADRYL) capsule 25 mg  25 mg Oral QHS PRN Vernelle Emerald, MD   25 mg at 10/31/20 2123  . enoxaparin (LOVENOX) injection 40 mg  40 mg Subcutaneous Q24H Shalhoub, Sherryll Burger, MD   40 mg at 11/04/20 1716  . famotidine (PEPCID) tablet 20 mg  20 mg Oral Daily Bonnielee Haff, MD   20 mg at 11/05/20 0959  . feeding supplement (ENSURE ENLIVE / ENSURE PLUS) liquid 237 mL  237 mL Oral TID BM Bonnielee Haff, MD   237 mL at 11/05/20 1043  . fluticasone (FLONASE) 50 MCG/ACT nasal spray 2 spray  2 spray Each Nare Daily Lavina Hamman, MD   2 spray at 11/05/20 1002  . guaiFENesin (MUCINEX) 12 hr tablet 600 mg  600 mg Oral BID Lavina Hamman, MD   600 mg at 11/05/20 0959  . guaiFENesin-dextromethorphan (ROBITUSSIN DM) 100-10 MG/5ML syrup 10 mL  10 mL Oral Q4H PRN Shalhoub, Sherryll Burger, MD   10 mL at 11/04/20 1920  . lactulose (CHRONULAC) 10 GM/15ML solution 10 g  10 g Oral Daily Lavina Hamman, MD   10 g at 11/05/20 1002  . mometasone-formoterol (DULERA) 100-5 MCG/ACT inhaler 2 puff  2 puff Inhalation BID Bonnielee Haff, MD   2 puff at 11/05/20 [phone]  . multivitamin with minerals tablet 1 tablet  1 tablet Oral Daily Bonnielee Haff, MD   1 tablet at 11/05/20 0959  . ondansetron (ZOFRAN) tablet 4 mg  4 mg Oral Q6H PRN Shalhoub, Sherryll Burger, MD       Or  . ondansetron [facility]) injection 4 mg  4 mg Intravenous Q6H PRN Shalhoub, Sherryll Burger, MD      . oxymetazoline (AFRIN) 0.05 % nasal spray 1 spray  1 spray Each Nare BID Lavina Hamman, MD   1 spray at 11/05/20 1002  . polyethylene glycol (MIRALAX / GLYCOLAX) packet 17 g  17 g Oral Daily PRN Vernelle Emerald, MD   17 g at 10/27/20 2141  . polyethylene glycol (MIRALAX / GLYCOLAX) packet 17 g  17 g Oral Daily Lavina Hamman, MD   17 g at 11/05/20 0959  .  [START ON 11/06/2020] predniSONE (DELTASONE) tablet 40 mg  40 mg Oral Q breakfast Bonnielee Haff, MD      . senna-docusate (Senokot-S) tablet 1 tablet  1 tablet Oral BID Lavina Hamman, MD   1 tablet at 11/05/20 [phone]  . simvastatin (ZOCOR) tablet 40 mg  40 mg Oral q1800 Vernelle Emerald, MD   40 mg at 11/04/20 1716  . sodium chloride flush (NS) 0.9 % injection 10-40 mL  10-40 mL Intracatheter PRN Lavina Hamman, MD   10 mL at 11/03/20 2217  . zinc sulfate capsule 220 mg  220 mg Oral Daily Shalhoub, Sherryll Burger, MD   220 mg at  11/05/20 0959     Discharge Medications: Please see discharge summary for a list of discharge medications.  Relevant Imaging Results:  Relevant Lab Results:   Additional Information [id number]  Trula Ore, LCSWA

## 2020-11-05 NOTE — Progress Notes (Signed)
Patient's wife called and requested that Dr. Maryland Pink call her tomorrow from patient's room to review DNR status again with her and patient.  Advised that I could have Dr. Maryland Pink call now, but wife declined and stated she would rather wait until tomorrow when patient was less tired.  Confirmed patient is currently DNR.  Advised will pass on for Dr. Maryland Pink to call her tomorrow when he sees patient.

## 2020-11-05 NOTE — Progress Notes (Addendum)
[facility] Progress Note  Patient: Antonio [name]    ZOX:[id number]  DOA: 10/26/2020     Date of Service: the patient was seen and examined on 11/05/2020  Brief hospital course: Past medical history of B-cell lymphoma, chemotherapy due in 2000, recent COVID-19 infection. Presents again to the hospital with worsening shortness of breath found to have worsening hypoxia as well as likely inflammation worsening from COVID-19 pneumonia.   Assessment and Plan:  Acute Hypoxic Resp. Failure/Pneumonia due to COVID-19  Recent Labs  Lab 10/31/20 0302 11/01/20 0649 11/02/20 0521 11/03/20 0621 11/04/20 0510 11/05/20 0325  DDIMER 0.78* 0.94* 0.84* 0.76* 0.79*  --   CRP 1.3* 2.8* 3.1* 1.3* 0.7  --   ALT 51* 50* 45* 39 43 42  PROCALCITON  --   --  <0.10 <0.10  --   --     Objective findings: Oxygen requirements: Nasal cannula at 3 to 4 L/min.  Saturating in the early 90s.    COVID 19 Therapeutics: Antibacterials: None Remdesivir: Completed 5-day course during previous hospitalization Steroids: On Solu-Medrol.  We will start tapering Diuretics: None currently Inhaled Steroids: Dulera Actemra/Baricitinib: Not given yet PUD Prophylaxis: Pepcid DVT Prophylaxis:  Lovenox  Tested positive on 1/4  From a respiratory standpoint patient seems to be slowly improving.  He was initially requiring 7 to 8 L of oxygen.  Now down to 3 L.  Noted to be hyperglycemic which is due to steroids.  We will start cutting back on the dose.  Inflammatory markers had improved.  Procalcitonin was less than 0.1.  Continue with incentive spirometry.  He was given 1 dose of furosemide yesterday.  We will repeat another dose today.  The treatment plan and use of medications and known side effects were discussed with patient/family. Some of the medications used are based on case reports/anecdotal data.  All other medications being used in the management of COVID-19 based on limited study data.  Complete risks and  long-term side effects are unknown, however in the best clinical judgment they seem to be of some benefit.  Patient/family wanted to proceed with treatment options provided.  History of B-cell lymphoma  Outpatient follow-up with medical oncology  Essential hypertension/Chronic HFpEF Blood pressure is reasonably well controlled.  Continue to monitor.  Echocardiogram shows preserved EF.   Mild hyperkalemia Resolved  Hyperlipidemia Continue statin  Leucocytosis Due to steroids   Severe constipation  Continue with bowel regimen.   DVT Prophylaxis:   enoxaparin (LOVENOX) injection 40 mg Start: 10/26/20 1715   Advance goals of care discussion: DNR. Discussed with his wife since he was listed as Full Code and there was a [place] DNR form in his physical chart. Patient was not certain and deferred to wife. Wife confirmed DNR status.  Family Communication: Daughter was updated yesterday.  We will do so again today.  Disposition: Home health was recommended by PT as long as there was 24/7 supervision.  Patient's wife unable to provide adequate care.  TOC consulted for SNF.  Patient agreeable.  Status is: Inpatient  Remains inpatient appropriate because:IV treatments appropriate due to intensity of illness or inability to take PO and Inpatient level of care appropriate due to severity of illness   Dispo: The patient is from: Home              Anticipated d/c is to: SNF              Anticipated d/c date is: 2 days  Patient currently is not medically stable to d/c.    Subjective: Patient eating breakfast this morning.  Overall he states that he is feeling better.  Denies any chest pain nausea vomiting.  Shortness of breath is improving.  Cough is getting better.     Physical Exam:  General appearance: Awake alert.  In no distress Resp: Less tachypneic today compared to yesterday.  Few crackles at the bases.  No wheezing appreciated Cardio: S1-S2 is normal regular.   No S3-S4.  No rubs murmurs or bruit GI: Abdomen is soft.  Nontender nondistended.  Bowel sounds are present normal.  No masses organomegaly Extremities: No edema.  Able to move all of his extremities. Neurologic: Alert and oriented x3.  No focal neurological deficits.     Vitals:   11/04/20 2130 11/05/20 0115 11/05/20 0307 11/05/20 0838  BP:   123/72 110/68  Pulse: 95 78 88 72  Resp:   16 18  Temp:   97.8 F (36.6 C) 98.9 F (37.2 C)  TempSrc:   Oral Oral  SpO2: 94% 96% 92% 93%  Weight:   79.9 kg   Height:        Intake/Output Summary (Last 24 hours) at 11/05/2020 1158 Last data filed at 11/05/2020 0840 Gross per 24 hour  Intake -  Output 1050 ml  Net -1050 ml   Filed Weights   11/03/20 0621 11/04/20 0521 11/05/20 0307  Weight: 79.9 kg 80.2 kg 79.9 kg    Data Reviewed:  CBC: Recent Labs  Lab 11/01/20 0649 11/02/20 0521 11/03/20 0621 11/04/20 0510 11/05/20 0325  WBC 9.4 11.3* 15.2* 12.2* 13.2*  NEUTROABS 7.1 10.1* 13.5* 10.8* 11.4*  HGB 14.2 13.5 12.9* 13.0 14.1  HCT 42.6 40.7 41.0 38.5* 42.0  MCV 97.7 99.3 99.5 97.5 97.9  PLT 230 242 247 225 [id number]   Basic Metabolic Panel: Recent Labs  Lab 10/30/20 0150 10/31/20 0302 11/01/20 0649 11/02/20 0521 11/03/20 0621 11/04/20 0510 11/05/20 0325  NA 134* 135 142 140 138 134* 137  K 4.7 4.2 4.7 4.6 4.6 4.8 4.5  CL 98 95* 96* 99 98 95* 94*  CO2 26 30 35* 32 31 31 32  GLUCOSE 172* 131* 115* 155* 147* 164* 291*  BUN 19 24* 27* 25* 26* 26* 32*  CREATININE 0.77 0.84 0.96 0.85 0.77 0.71 0.85  CALCIUM 8.5* 8.7* 8.7* 8.4* 8.6* 8.7* 9.0  MG 2.2 2.1  --   --   --   --   --     Studies: No results found.  Scheduled Meds: . vitamin C  500 mg Oral Daily  . benzonatate  100 mg Oral TID  . Chlorhexidine Gluconate Cloth  6 each Topical Daily  . chlorpheniramine-HYDROcodone  5 mL Oral Q12H  . enoxaparin (LOVENOX) injection  40 mg Subcutaneous Q24H  . famotidine  20 mg Oral Daily  . feeding supplement  237 mL Oral TID BM   . fluticasone  2 spray Each Nare Daily  . guaiFENesin  600 mg Oral BID  . lactulose  10 g Oral Daily  . methylPREDNISolone (SOLU-MEDROL) injection  40 mg Intravenous BID  . mometasone-formoterol  2 puff Inhalation BID  . multivitamin with minerals  1 tablet Oral Daily  . oxymetazoline  1 spray Each Nare BID  . polyethylene glycol  17 g Oral Daily  . senna-docusate  1 tablet Oral BID  . simvastatin  40 mg Oral q1800  . zinc sulfate  220 mg Oral Daily   Continuous  Infusions: PRN Meds: acetaminophen, albuterol, diphenhydrAMINE, guaiFENesin-dextromethorphan, ondansetron **OR** ondansetron (ZOFRAN) IV, polyethylene glycol, sodium chloride flush   Author: Bonnielee Haff  [facility] 11/05/2020 11:58 AM  To reach On-call, see care teams to locate the attending and reach out via [email or web address]. Between 7PM-7AM, please contact night-coverage If you still have difficulty reaching the attending provider, please page the [facility] (Director on Call) for [facility] on amion for assistance.

## 2020-11-06 LAB — BASIC METABOLIC PANEL
Anion gap: 11 (ref 5–15)
BUN: 40 mg/dL — ABNORMAL HIGH (ref 8–23)
CO2: 34 mmol/L — ABNORMAL HIGH (ref 22–32)
Calcium: 9.2 mg/dL (ref 8.9–10.3)
Chloride: 96 mmol/L — ABNORMAL LOW (ref 98–111)
Creatinine, Ser: 0.91 mg/dL (ref 0.61–1.24)
GFR, Estimated: >60 mL/min (ref >60)
Glucose, Bld: 133 mg/dL — ABNORMAL HIGH (ref 70–99)
Potassium: 4.2 mmol/L (ref 3.5–5.1)
Sodium: 141 mmol/L (ref 135–145)

## 2020-11-06 MED ORDER — FUROSEMIDE 10 MG/ML IJ SOLN
20.0000 mg | Freq: Once | INTRAMUSCULAR | Status: AC
  Administered 2020-11-06: 20 mg via INTRAVENOUS
  Filled 2020-11-06: qty 2

## 2020-11-06 NOTE — Plan of Care (Signed)

## 2020-11-06 NOTE — Progress Notes (Signed)
Physical Therapy Treatment Patient Details Name: Antonio [name] MRN: [id number] DOB: November 06, 1940 Today's Date: 11/06/2020    History of Present Illness Pt is an 80 year old male who is vaccinated against COVID-19 with past medical history of left bundle branch block, essential tremor, prior melanoma, right testicular large B-cell lymphoma , hypertension, hyperlipidemia, MGUS who returned to the hospital on 10/26/20 with acute respiratory failure with hypoxia. Pt was discharged on 10/23/20 after admission for COVID-19.    PT Comments    Pt continues to progress with mobility. Have been working on amb without assistive device to work on balance but will recommend pt initially use walker at DC.  Pt first tested positive on Dec 28th at [facility] hopefully can come off precautions so he can begin mobilizing in hallways. The small confines of his room make mobilizing difficult.   Follow Up Recommendations  Home health PT;Supervision for mobility/OOB     Equipment Recommendations  None recommended by PT    Recommendations for Other Services       Precautions / Restrictions Precautions Precautions: Fall    Mobility  Bed Mobility Overal bed mobility: Modified Independent Bed Mobility: Supine to Sit;Sit to Supine     Supine to sit: Modified independent (Device/Increase time);HOB elevated Sit to supine: Modified independent (Device/Increase time);HOB elevated      Transfers Overall transfer level: Needs assistance Equipment used: Rolling walker (2 wheeled) Transfers: Sit to/from Stand Sit to Stand: Supervision         General transfer comment: supervision for safety/lines  Ambulation/Gait Ambulation/Gait assistance: Supervision Gait Distance (Feet): 200 Feet Assistive device: None Gait Pattern/deviations: Step-through pattern;Decreased stride length Gait velocity: decr Gait velocity interpretation: 1.31 - 2.62 ft/sec, indicative of limited community ambulator General Gait  Details: Slightly unsteady with a couple of minor losses of balance which he was able to self correct.   Stairs             Wheelchair Mobility    Modified Rankin (Stroke Patients Only)       Balance Overall balance assessment: Needs assistance Sitting-balance support: No upper extremity supported Sitting balance-Leahy Scale: Good     Standing balance support: During functional activity;No upper extremity supported Standing balance-Leahy Scale: Fair                              Cognition Arousal/Alertness: Awake/alert Behavior During Therapy: WFL for tasks assessed/performed Overall Cognitive Status: No family/caregiver present to determine baseline cognitive functioning Area of Impairment: Memory                     Memory: Decreased short-term memory                Exercises Other Exercises Other Exercises: repeated sit to stand with use of UE's x 3 Other Exercises: repeated sit to stand    General Comments General comments (skin integrity, edema, etc.): Amb on 4L with SpO2 90% or greater and HR 138. At rest on 4L SpO2 96%.      Pertinent Vitals/Pain Pain Assessment: No/denies pain    Home Living                      Prior Function            PT Goals (current goals can now be found in the care plan section) Acute Rehab PT Goals Patient Stated Goal: breathe better Progress towards PT goals: Progressing  toward goals    Frequency    Min 3X/week      PT Plan Current plan remains appropriate    Co-evaluation              AM-PAC PT "6 Clicks" Mobility   Outcome Measure  Help needed turning from your back to your side while in a flat bed without using bedrails?: None Help needed moving from lying on your back to sitting on the side of a flat bed without using bedrails?: None Help needed moving to and from a bed to a chair (including a wheelchair)?: None Help needed standing up from a chair using your  arms (e.g., wheelchair or bedside chair)?: None Help needed to walk in hospital room?: A Little Help needed climbing 3-5 steps with a railing? : A Little 6 Click Score: 22    End of Session Equipment Utilized During Treatment: Oxygen Activity Tolerance: Patient tolerated treatment well Patient left: in bed;with call bell/phone within reach;with nursing/sitter in room Nurse Communication: Mobility status PT Visit Diagnosis: Other abnormalities of gait and mobility (R26.89);Muscle weakness (generalized) (M62.81)     Time: 6295-2841 PT Time Calculation (min) (ACUTE ONLY): 23 min  Charges:  $Gait Training: 23-37 mins                     [place] Pager [phone] Office [place] 11/06/2020, 4:58 PM

## 2020-11-06 NOTE — Care Management Important Message (Signed)
Important Message  Patient Details  Name: ALESANDRO STUEVE MRN: [id number] Date of Birth: 07-14-1941   Medicare Important Message Given:  Yes  Pt. On precautions gave IM Letter to [employer], to deliver to pt.     Holli Humbles Smith 11/06/2020, 1:20 PM

## 2020-11-06 NOTE — TOC Progression Note (Signed)
Transition of Care [facility]) - Progression Note    Patient Details  Name: Antonio [name] MRN: [id number] Date of Birth: 10/21/1940  Transition of Care [facility]) CM/SW [place], [place] Phone Number: 11/06/2020, 2:26 PM  Clinical Narrative:     CSW received call from patients insurance that SNF and PTAR was not approved and went to peer to peer. CSW gave information to MD to call Dr. Coralie Carpen for a peer to peer. Tele#[phone].  CSW will continue to follow.   Expected Discharge Plan: [place] Barriers to Discharge: Continued Medical Work up  Expected Discharge Plan and Services Expected Discharge Plan: [place] In-house Referral: Clinical Social Work     Living arrangements for the past 2 months: Single Family Home                                       Social Determinants of Health (SDOH) Interventions    Readmission Risk Interventions No flowsheet data found.

## 2020-11-06 NOTE — Progress Notes (Addendum)
[facility] Progress Note  Patient: Antonio [name]    KDT:[id number]  DOA: 10/26/2020       Brief hospital course: Past medical history of B-cell lymphoma, chemotherapy due in 2000, recent COVID-19 infection. Presents again to the hospital with worsening shortness of breath found to have worsening hypoxia as well as likely inflammation worsening from COVID-19 pneumonia.   Assessment and Plan:  Acute Hypoxic Resp. Failure/Pneumonia due to COVID-19  Recent Labs  Lab 10/31/20 0302 11/01/20 0649 11/02/20 0521 11/03/20 0621 11/04/20 0510 11/05/20 0325  DDIMER 0.78* 0.94* 0.84* 0.76* 0.79*  --   CRP 1.3* 2.8* 3.1* 1.3* 0.7  --   ALT 51* 50* 45* 39 43 42  PROCALCITON  --   --  <0.10 <0.10  --   --     Objective findings: Oxygen requirements: Patient was noted to be on 67 L of oxygen this morning.  Reason for higher O2 requirements not clear.  Patient was dropped down back to 4 L and saturations noted to be in the late 90s.  Nursing staff notified.  COVID 19 Therapeutics: Antibacterials: None Remdesivir: Completed 5-day course during previous hospitalization Steroids: He was changed over to prednisone this morning Diuretics: Patient was given furosemide the last 2 days.  Will give additional dose today. Inhaled Steroids: Dulera Actemra/Baricitinib: Not given yet PUD Prophylaxis: Pepcid DVT Prophylaxis:  Lovenox  Tested positive on 1/4  From a respiratory standpoint patient seems to be stable.  Reason for higher amount of oxygen this morning is not clear.  Patient denies any new complaints.  Overall he feels better.  Dropped him back to 4 L.  Nursing staff was notified.  Inflammatory markers had improved.  D-dimer was also stable.  It was found that patient tested positive for COVID 19 at his [facility] on 10/13/20. Verified by Case manager. Since patient has improved and it has been more than 21 days since positive result, he can come off isolation. Will discontinue airborne and  contact precautions.  The treatment plan and use of medications and known side effects were discussed with patient/family. Some of the medications used are based on case reports/anecdotal data.  All other medications being used in the management of COVID-19 based on limited study data.  Complete risks and long-term side effects are unknown, however in the best clinical judgment they seem to be of some benefit.  Patient/family wanted to proceed with treatment options provided.  History of B-cell lymphoma  Outpatient follow-up with medical oncology  Essential hypertension/Chronic HFpEF Blood pressure is reasonably well controlled.  Continue to monitor.  Echocardiogram shows preserved EF.   Mild hyperkalemia Resolved  Hyperlipidemia Continue statin  Leucocytosis Due to steroids   Severe constipation  Continue with bowel regimen.   DVT Prophylaxis:   enoxaparin (LOVENOX) injection 40 mg Start: 10/26/20 1715   Advance goals of care discussion: DNR.  Discussed with patient this morning and he was very clear that he did not want CPR or intubation/mechanical ventilation.  Family Communication: Wife being updated daily  Disposition: Home health was recommended by PT as long as there was 24/7 supervision.  Patient's wife unable to provide adequate care.  TOC consulted for SNF.  Patient agreeable.  Status is: Inpatient  Remains inpatient appropriate because:IV treatments appropriate due to intensity of illness or inability to take PO and Inpatient level of care appropriate due to severity of illness   Dispo: The patient is from: Home  Anticipated d/c is to: SNF              Anticipated d/c date is: 2 days              Patient currently is not medically stable to d/c.    Subjective: Patient denies any new complaints.  Denies any chest pain.  No worsening shortness of breath.  Cough is about the same    Physical Exam:  General appearance: Awake alert.  In no  distress Resp: Improving effort.  Few crackles at the bases.  No wheezing or rhonchi at this time Cardio: S1-S2 is normal regular.  No S3-S4.  No rubs murmurs or bruit GI: Abdomen is soft.  Nontender nondistended.  Bowel sounds are present normal.  No masses organomegaly Extremities: No edema.  Neurologic: Alert and oriented x3.  No focal neurological deficits.      Vitals:   11/05/20 1617 11/05/20 2109 11/05/20 2112 11/06/20 0406  BP: 102/74 132/80  122/72  Pulse: (!) 103  98 67  Resp: 20   18  Temp: 98.7 F (37.1 C) 97.9 F (36.6 C)  98.2 F (36.8 C)  TempSrc: Oral Oral  Oral  SpO2: 92%  92% 92%  Weight:    79.6 kg  Height:        Intake/Output Summary (Last 24 hours) at 11/06/2020 1149 Last data filed at 11/06/2020 0405 Gross per 24 hour  Intake 360 ml  Output 725 ml  Net -365 ml   Filed Weights   11/04/20 0521 11/05/20 0307 11/06/20 0406  Weight: 80.2 kg 79.9 kg 79.6 kg    Data Reviewed:  CBC: Recent Labs  Lab 11/01/20 0649 11/02/20 0521 11/03/20 0621 11/04/20 0510 11/05/20 0325  WBC 9.4 11.3* 15.2* 12.2* 13.2*  NEUTROABS 7.1 10.1* 13.5* 10.8* 11.4*  HGB 14.2 13.5 12.9* 13.0 14.1  HCT 42.6 40.7 41.0 38.5* 42.0  MCV 97.7 99.3 99.5 97.5 97.9  PLT 230 242 247 225 419   Basic Metabolic Panel: Recent Labs  Lab 10/31/20 0302 11/01/20 0649 11/02/20 0521 11/03/20 0621 11/04/20 0510 11/05/20 0325 11/06/20 0446  NA 135   < > 140 138 134* 137 141  K 4.2   < > 4.6 4.6 4.8 4.5 4.2  CL 95*   < > 99 98 95* 94* 96*  CO2 30   < > 32 31 31 32 34*  GLUCOSE 131*   < > 155* 147* 164* 291* 133*  BUN 24*   < > 25* 26* 26* 32* 40*  CREATININE 0.84   < > 0.85 0.77 0.71 0.85 0.91  CALCIUM 8.7*   < > 8.4* 8.6* 8.7* 9.0 9.2  MG 2.1  --   --   --   --   --   --    < > = values in this interval not displayed.    Studies: No results found.  Scheduled Meds:  vitamin C  500 mg Oral Daily   benzonatate  100 mg Oral TID   Chlorhexidine Gluconate Cloth  6 each  Topical Daily   chlorpheniramine-HYDROcodone  5 mL Oral Q12H   enoxaparin (LOVENOX) injection  40 mg Subcutaneous Q24H   famotidine  20 mg Oral Daily   feeding supplement  237 mL Oral TID BM   fluticasone  2 spray Each Nare Daily   guaiFENesin  600 mg Oral BID   lactulose  10 g Oral Daily   mometasone-formoterol  2 puff Inhalation BID   multivitamin with  minerals  1 tablet Oral Daily   oxymetazoline  1 spray Each Nare BID   polyethylene glycol  17 g Oral Daily   predniSONE  40 mg Oral Q breakfast   senna-docusate  1 tablet Oral BID   simvastatin  40 mg Oral q1800   zinc sulfate  220 mg Oral Daily   Continuous Infusions: PRN Meds: acetaminophen, albuterol, diphenhydrAMINE, guaiFENesin-dextromethorphan, ondansetron **OR** ondansetron (ZOFRAN) IV, polyethylene glycol, sodium chloride flush   Author: Bonnielee Haff  [facility] 11/06/2020 11:49 AM  To reach On-call, see care teams to locate the attending and reach out via [email or web address]. Between 7PM-7AM, please contact night-coverage If you still have difficulty reaching the attending provider, please page the [facility] (Director on Call) for [facility] on amion for assistance.

## 2020-11-07 LAB — BASIC METABOLIC PANEL
Anion gap: 10 (ref 5–15)
BUN: 38 mg/dL — ABNORMAL HIGH (ref 8–23)
CO2: 36 mmol/L — ABNORMAL HIGH (ref 22–32)
Calcium: 9 mg/dL (ref 8.9–10.3)
Chloride: 96 mmol/L — ABNORMAL LOW (ref 98–111)
Creatinine, Ser: 0.9 mg/dL (ref 0.61–1.24)
GFR, Estimated: >60 mL/min (ref >60)
Glucose, Bld: 134 mg/dL — ABNORMAL HIGH (ref 70–99)
Potassium: 4.3 mmol/L (ref 3.5–5.1)
Sodium: 142 mmol/L (ref 135–145)

## 2020-11-07 NOTE — Progress Notes (Signed)
PROGRESS NOTE  PINK MAYE NWG:[id number] DOB: 1941-08-09 DOA: 10/26/2020 PCP: [name], DO  Brief History   Past medical history of B-cell lymphoma, chemotherapy due in 2000, recent COVID-19 infection. Presents again to the hospital with worsening shortness of breath found to have worsening hypoxia as well as likely inflammation worsening from COVID-19 pneumonia.  The patient is now past the 21 day period of isolation and has been transferred out of the [place] room to the floor. He is now saturating 95% on 3L.  Consultants  . None  Procedures  . None  Antibiotics   Anti-infectives (From admission, onward)   Start     Dose/Rate Route Frequency Ordered Stop   10/26/20 1700  cefTRIAXone (ROCEPHIN) 2 g in sodium chloride 0.9 % 100 mL IVPB  Status:  Discontinued        2 g 200 mL/hr over 30 Minutes Intravenous Every 24 hours 10/26/20 1649 10/27/20 1131   10/26/20 1700  azithromycin (ZITHROMAX) 500 mg in sodium chloride 0.9 % 250 mL IVPB  Status:  Discontinued        500 mg 250 mL/hr over 60 Minutes Intravenous Every 24 hours 10/26/20 1649 10/27/20 1131    .   Subjective  The patient is resting comfortably. No new complaints.  Objective   Vitals:  Vitals:   11/07/20 0432 11/07/20 0837  BP: 113/80 103/72  Pulse: 81   Resp:  18  Temp: 98.6 F (37 C) 98.6 F (37 C)  SpO2: 96% 93%   Exam:  Constitutional:  . The patient is awake, alert, and oriented x 3. No acute distress. Respiratory:  . No increased work of breathing. . No wheezes, rales, or rhonchi . No tactile fremitus Cardiovascular:  . Regular rate and rhythm . No murmurs, ectopy, or gallups. . No lateral PMI. No thrills. Abdomen:  . Abdomen is soft, non-tender, non-distended . No hernias, masses, or organomegaly . Normoactive bowel sounds.  Musculoskeletal:  . No cyanosis, clubbing, or edema Skin:  . No rashes, lesions, ulcers . palpation of skin: no induration or nodules Neurologic:  . CN  2-12 intact . Sensation all 4 extremities intact Psychiatric:  . Mental status o Mood, affect appropriate o Orientation to person, place, time  . judgment and insight appear intact  I have personally reviewed the following:   Today's Data  . Vitals, BMP  Micro Data  . Blood culture x 2  Imaging  . CXR 11/02/2020 . Abd Film 11/02/2020 . CTA chest 10/26/2020  Cardiology Data  . EKG 10/27/2020 . Echocardiogram: EF 50% with low normal function of the LV. No regional wall motion abnormalities. There is mild left ventricular hypertrophy. There is evidence for Grade 1 diastolic dysfunction. RV function is mildly reduced.   Scheduled Meds: . vitamin C  500 mg Oral Daily  . benzonatate  100 mg Oral TID  . Chlorhexidine Gluconate Cloth  6 each Topical Daily  . chlorpheniramine-HYDROcodone  5 mL Oral Q12H  . enoxaparin (LOVENOX) injection  40 mg Subcutaneous Q24H  . famotidine  20 mg Oral Daily  . feeding supplement  237 mL Oral TID BM  . fluticasone  2 spray Each Nare Daily  . guaiFENesin  600 mg Oral BID  . lactulose  10 g Oral Daily  . mometasone-formoterol  2 puff Inhalation BID  . multivitamin with minerals  1 tablet Oral Daily  . oxymetazoline  1 spray Each Nare BID  . polyethylene glycol  17 g Oral Daily  .  predniSONE  40 mg Oral Q breakfast  . senna-docusate  1 tablet Oral BID  . simvastatin  40 mg Oral q1800  . zinc sulfate  220 mg Oral Daily   Principal Problem:   Acute respiratory failure with hypoxia (HCC) Active Problems:   Essential hypertension   Diffuse large B cell lymphoma (HCC)   Acute hypoxemic respiratory failure due to COVID-19 [facility])   Mixed hyperlipidemia   Acute metabolic encephalopathy   Lobar pneumonia (HCC)   LOS: 12 days   A & P   Acute Hypoxic Resp. Failure/Pneumonia due to COVID-19: Respiratory status is trending towards baseline. Today he is saturating 95% on 3L by [place]. The patient has completed the following in terms of COVID-19  therapeutics: Antibacterials: None Remdesivir: Completed 5-day course during previous hospitalization Steroids: He was changed over to prednisone this morning Diuretics: Patient was given furosemide the last 2 days.  Will give additional dose today. Inhaled Steroids: Dulera Actemra/Baricitinib: Not given yet PUD Prophylaxis: Pepcid DVT Prophylaxis:  Lovenox Tested positive on 1/4. Inflammatory markers are steadily improving.   History of B-cell lymphoma: Outpatient follow-up with medical oncology  Essential hypertension/Chronic HFpEF: Blood pressure is reasonably well controlled. Continue to monitor. Echocardiogram shows preserved EF.   Mild hyperkalemia: Resolved  Hyperlipidemia: Continue statin  Leukocytosis: Due to steroids   Severe constipation: Continue with bowel regimen.  I have seen and examined this patient myself. I have spent 32 minutes in his evaluation and care.  DVT Prophylaxis:   enoxaparin (LOVENOX) injection 40 mg Start: 10/26/20 1715 Advance goals of care discussion: DNR.   Family Communication: Wife being updated daily Disposition: Home health was recommended by PT as long as there was 24/7 supervision.  Patient's wife unable to provide adequate care.  TOC consulted for SNF.  Patient agreeable.  Raiana Pharris, DO [facility] Direct contact: see [email or web address]  7PM-7AM contact night coverage as above 11/07/2020, 1:22 PM  LOS: 12 days

## 2020-11-07 NOTE — TOC Progression Note (Signed)
Transition of Care [facility]) - Progression Note    Patient Details  Name: Antonio [name] MRN: [id number] Date of Birth: 10-14-1941  Transition of Care [facility]) CM/SW Contact  Ella Bodo, RN Phone Number: 11/07/2020, 1058 Clinical Narrative:   Peer to peer has been completed; patient once again denied for skilled nursing facility by insurance, likely as PT/OT recommending home health follow-up.  Spoke with patient's daughter, [name]: She states that patient's wife is unable to care for patient at home, and is concerned about readmission.  I spoke with Junie Panning in admissions at [facility]; she states that facility has its own home care agency that could provide home health aides and skilled PT/OT.  She states that she will call daughter and discuss this option with her.  Followed up with [name], patient's daughter, after conversation with [facility] representative.  She states that [facility] is saying that patient is eligible for SNF for rehab, but this cannot be confirmed until Monday when social worker is available.  She states this would be best scenario for patient, though they are considering in-home aides/home health follow-up.  Daughter states will follow-up with [facility] social worker on Monday to determine disposition.   Expected Discharge Plan: [place] Barriers to Discharge: Continued Medical Work up  Expected Discharge Plan and Services Expected Discharge Plan: [place] In-house Referral: Clinical Social Work     Living arrangements for the past 2 months: Single Family Home                                       Social Determinants of Health (SDOH) Interventions    Readmission Risk Interventions No flowsheet data found.  Reinaldo Raddle, RN, BSN  Trauma/Neuro ICU Case Manager [phone]

## 2020-11-07 NOTE — Plan of Care (Signed)

## 2020-11-07 NOTE — TOC Progression Note (Addendum)
Transition of Care [facility]) - Progression Note    Patient Details  Name: Antonio [name] MRN: [id number] Date of Birth: 1941-03-02  Transition of Care [facility]) CM/SW [place], [place] Phone Number: [phone] 11/07/2020, 8:58 AM  Clinical Narrative:     CSW reached out to [employer] to ascertain the outcome of the authorization and CSW was informed it appeared that the peer to peer to had not been completed yet. CSW was notified that she would check for sure and follow up with CSW.  10:00am- received a call back from [employer] and pt's authorization was denied after peer to peer.  TOC team will continue to assist with discharge planning needs.   Expected Discharge Plan: [place] Barriers to Discharge: Continued Medical Work up  Expected Discharge Plan and Services Expected Discharge Plan: [place] In-house Referral: Clinical Social Work     Living arrangements for the past 2 months: Single Family Home                                       Social Determinants of Health (SDOH) Interventions    Readmission Risk Interventions No flowsheet data found.

## 2020-11-08 LAB — BASIC METABOLIC PANEL
Anion gap: 9 (ref 5–15)
BUN: 35 mg/dL — ABNORMAL HIGH (ref 8–23)
CO2: 34 mmol/L — ABNORMAL HIGH (ref 22–32)
Calcium: 8.9 mg/dL (ref 8.9–10.3)
Chloride: 99 mmol/L (ref 98–111)
Creatinine, Ser: 0.9 mg/dL (ref 0.61–1.24)
GFR, Estimated: >60 mL/min (ref >60)
Glucose, Bld: 123 mg/dL — ABNORMAL HIGH (ref 70–99)
Potassium: 4.2 mmol/L (ref 3.5–5.1)
Sodium: 142 mmol/L (ref 135–145)

## 2020-11-08 LAB — CBC WITH DIFFERENTIAL/PLATELET
Abs Immature Granulocytes: 0.85 10*3/uL — ABNORMAL HIGH (ref 0.00–0.07)
Basophils Absolute: 0.1 10*3/uL (ref 0.0–0.1)
Basophils Relative: 1 %
Eosinophils Absolute: 0.1 10*3/uL (ref 0.0–0.5)
Eosinophils Relative: 0 %
HCT: 43.1 % (ref 39.0–52.0)
Hemoglobin: 13.5 g/dL (ref 13.0–17.0)
Immature Granulocytes: 7 %
Lymphocytes Relative: 6 %
Lymphs Abs: 0.7 10*3/uL (ref 0.7–4.0)
MCH: 31.8 pg (ref 26.0–34.0)
MCHC: 31.3 g/dL (ref 30.0–36.0)
MCV: 101.7 fL — ABNORMAL HIGH (ref 80.0–100.0)
Monocytes Absolute: 1.6 10*3/uL — ABNORMAL HIGH (ref 0.1–1.0)
Monocytes Relative: 13 %
Neutro Abs: 9.4 10*3/uL — ABNORMAL HIGH (ref 1.7–7.7)
Neutrophils Relative %: 73 %
Platelets: 187 10*3/uL (ref 150–400)
RBC: 4.24 MIL/uL (ref 4.22–5.81)
RDW: 13.2 % (ref 11.5–15.5)
WBC: 12.8 10*3/uL — ABNORMAL HIGH (ref 4.0–10.5)
nRBC: 0 % (ref 0.0–0.2)

## 2020-11-08 MED ORDER — PREDNISONE 20 MG PO TABS
20.0000 mg | ORAL_TABLET | Freq: Every day | ORAL | Status: AC
  Administered 2020-11-09 – 2020-11-11 (×3): 20 mg via ORAL
  Filled 2020-11-08 (×3): qty 1

## 2020-11-08 NOTE — Progress Notes (Addendum)
PROGRESS NOTE  Antonio [name] YWV:[id number] DOB: 20-Apr-1941 DOA: 10/26/2020 PCP: [name], DO  Brief History   Past medical history of B-cell lymphoma, chemotherapy due in 2000, recent COVID-19 infection. Presents again to the hospital with worsening shortness of breath found to have worsening hypoxia as well as likely inflammation worsening from COVID-19 pneumonia.  The patient is now past the 21 day period of isolation and has been transferred out of the [place] room to the floor. He is now saturating 95% on 2L.  The patient has been evaluated by PT. They have recommended SNF placement.  Consultants  . None  Procedures  . None  Antibiotics   Anti-infectives (From admission, onward)   Start     Dose/Rate Route Frequency Ordered Stop   10/26/20 1700  cefTRIAXone (ROCEPHIN) 2 g in sodium chloride 0.9 % 100 mL IVPB  Status:  Discontinued        2 g 200 mL/hr over 30 Minutes Intravenous Every 24 hours 10/26/20 1649 10/27/20 1131   10/26/20 1700  azithromycin (ZITHROMAX) 500 mg in sodium chloride 0.9 % 250 mL IVPB  Status:  Discontinued        500 mg 250 mL/hr over 60 Minutes Intravenous Every 24 hours 10/26/20 1649 10/27/20 1131      Subjective  The patient is resting comfortably. No new complaints.  Objective   Vitals:  Vitals:   11/08/20 0826 11/08/20 1202  BP: 94/66 106/71  Pulse: 68 99  Resp: 19 18  Temp: 98.3 F (36.8 C) 98.2 F (36.8 C)  SpO2: 92% 93%   Exam:  Constitutional:  . The patient is awake, alert, and oriented x 3. No acute distress. Respiratory:  . No increased work of breathing. . No wheezes, rales, or rhonchi . No tactile fremitus Cardiovascular:  . Regular rate and rhythm . No murmurs, ectopy, or gallups. . No lateral PMI. No thrills. Abdomen:  . Abdomen is soft, non-tender, non-distended . No hernias, masses, or organomegaly . Normoactive bowel sounds.  Musculoskeletal:  . No cyanosis, clubbing, or edema Skin:  . No rashes,  lesions, ulcers . palpation of skin: no induration or nodules Neurologic:  . CN 2-12 intact . Sensation all 4 extremities intact Psychiatric:  . Mental status o Mood, affect appropriate o Orientation to person, place, time  . judgment and insight appear intact  I have personally reviewed the following:   Today's Data  . Vitals, BMP, CBC  Micro Data  . Blood culture x 2  Imaging  . CXR 11/02/2020 . Abd Film 11/02/2020 . CTA chest 10/26/2020  Cardiology Data  . EKG 10/27/2020 . Echocardiogram: EF 50% with low normal function of the LV. No regional wall motion abnormalities. There is mild left ventricular hypertrophy. There is evidence for Grade 1 diastolic dysfunction. RV function is mildly reduced.   Scheduled Meds: . vitamin C  500 mg Oral Daily  . benzonatate  100 mg Oral TID  . Chlorhexidine Gluconate Cloth  6 each Topical Daily  . chlorpheniramine-HYDROcodone  5 mL Oral Q12H  . enoxaparin (LOVENOX) injection  40 mg Subcutaneous Q24H  . famotidine  20 mg Oral Daily  . feeding supplement  237 mL Oral TID BM  . fluticasone  2 spray Each Nare Daily  . guaiFENesin  600 mg Oral BID  . lactulose  10 g Oral Daily  . mometasone-formoterol  2 puff Inhalation BID  . multivitamin with minerals  1 tablet Oral Daily  . oxymetazoline  1  spray Each Nare BID  . polyethylene glycol  17 g Oral Daily  . predniSONE  40 mg Oral Q breakfast  . senna-docusate  1 tablet Oral BID  . simvastatin  40 mg Oral q1800  . zinc sulfate  220 mg Oral Daily   Principal Problem:   Acute respiratory failure with hypoxia (HCC) Active Problems:   Essential hypertension   Diffuse large B cell lymphoma (HCC)   Acute hypoxemic respiratory failure due to COVID-19 [facility])   Mixed hyperlipidemia   Acute metabolic encephalopathy   Lobar pneumonia ([place])   LOS: 13 days   A & P   Acute Hypoxic Resp. Failure/Pneumonia due to COVID-19: Respiratory status is trending towards baseline. Today he is saturating 95%  on 2L by [place]. The patient has completed the following in terms of COVID-19 therapeutics: Antibacterials: None Remdesivir: Completed 5-day course during previous hospitalization Steroids: Prednisone is being weaned down. Diuretics: Patient was given furosemide the last 2 days.  Will give additional dose today. Inhaled Steroids: Dulera Actemra/Baricitinib: Not given yet PUD Prophylaxis: Pepcid DVT Prophylaxis:  Lovenox Tested positive on 1/4. Inflammatory markers are steadily improving.   History of B-cell lymphoma: Outpatient follow-up with medical oncology  Essential hypertension/Chronic HFpEF: Blood pressure is reasonably well controlled. Continue to monitor. Echocardiogram shows preserved EF.   Mild hyperkalemia: Resolved  Hyperlipidemia: Continue statin  Leukocytosis: Due to steroids   Severe constipation: Continue with bowel regimen.  I have seen and examined this patient myself. I have spent 30 minutes in his evaluation and care.  DVT Prophylaxis:   enoxaparin (LOVENOX) injection 40 mg Start: 10/26/20 1715 Advance goals of care discussion: DNR.   Family Communication: Wife being updated daily Disposition: Home health was recommended by PT as long as there was 24/7 supervision.  Patient's wife unable to provide adequate care.  TOC consulted for SNF.  Patient agreeable.  Oda Lansdowne, DO [facility] Direct contact: see [email or web address]  7PM-7AM contact night coverage as above 11/08/2020, 1:57 PM  LOS: 12 days

## 2020-11-09 LAB — BASIC METABOLIC PANEL
Anion gap: 7 (ref 5–15)
BUN: 32 mg/dL — ABNORMAL HIGH (ref 8–23)
CO2: 36 mmol/L — ABNORMAL HIGH (ref 22–32)
Calcium: 8.9 mg/dL (ref 8.9–10.3)
Chloride: 101 mmol/L (ref 98–111)
Creatinine, Ser: 0.96 mg/dL (ref 0.61–1.24)
GFR, Estimated: >60 mL/min (ref >60)
Glucose, Bld: 111 mg/dL — ABNORMAL HIGH (ref 70–99)
Potassium: 4.4 mmol/L (ref 3.5–5.1)
Sodium: 144 mmol/L (ref 135–145)

## 2020-11-09 LAB — SARS CORONAVIRUS 2 (TAT 6-24 HRS): SARS Coronavirus 2: POSITIVE — AB

## 2020-11-09 NOTE — Progress Notes (Signed)
Occupational Therapy Treatment Patient Details Name: Antonio [name] MRN: [id number] DOB: 13-Feb-1941 Today's Date: 11/09/2020    History of present illness Pt is an 80 year old male who is vaccinated against COVID-19 with past medical history of left bundle branch block, essential tremor, prior melanoma, right testicular large B-cell lymphoma , hypertension, hyperlipidemia, MGUS who returned to the hospital on 10/26/20 with acute respiratory failure with hypoxia. Pt was discharged on 10/23/20 after admission for COVID-19.   OT comments  Pt making good progress with functional goals. Will return to [facility] when medically ready. OT will continue to follow acutely  Follow Up Recommendations  Home health OT;Supervision/Assistance - 24 hour    Equipment Recommendations  None recommended by OT    Recommendations for Other Services      Precautions / Restrictions Precautions Precautions: Fall Restrictions Weight Bearing Restrictions: No       Mobility Bed Mobility Overal bed mobility: Modified Independent Bed Mobility: Sit to Supine       Sit to supine: Modified independent (Device/Increase time)   General bed mobility comments: Pt up in chair upon arrival. pt back to bed at end of session  Transfers Overall transfer level: Modified independent Equipment used: None Transfers: Sit to/from [facility] Sit to Stand: Modified independent (Device/Increase time) Stand pivot transfers: Modified independent (Device/Increase time)            Balance Overall balance assessment: Needs assistance Sitting-balance support: No upper extremity supported Sitting balance-Leahy Scale: Good     Standing balance support: During functional activity;No upper extremity supported Standing balance-Leahy Scale: Fair                             ADL either performed or assessed with clinical judgement   ADL Overall ADL's : Needs assistance/impaired     Grooming:  Wash/dry hands;Wash/dry face;Standing       Lower Body Bathing: Min guard;Set up;Sit to/from stand       Lower Body Dressing: Sit to/from stand;Min guard   Toilet Transfer: Ambulation;RW;Modified Independent;Comfort height toilet   Toileting- Clothing Manipulation and Hygiene: Supervision/safety;Sit to/from stand       Functional mobility during ADLs: Modified independent       Vision Baseline Vision/History: Wears glasses Wears Glasses: At all times Patient Visual Report: No change from baseline     Perception     Praxis      Cognition Arousal/Alertness: Awake/alert Behavior During Therapy: WFL for tasks assessed/performed Overall Cognitive Status: No family/caregiver present to determine baseline cognitive functioning Area of Impairment: Memory                     Memory: Decreased short-term memory                  Exercises Exercises: Other exercises Other Exercises Other Exercises: reviewed incentive spirometer x 5   Shoulder Instructions       General Comments Amb on 4L with SpO2 >90%. HR to 130's with amb    Pertinent Vitals/ Pain       Pain Assessment: No/denies pain  Home Living                                          Prior Functioning/Environment              Frequency  Min  2X/week        Progress Toward Goals  OT Goals(current goals can now be found in the care plan section)  Progress towards OT goals: Progressing toward goals  Acute Rehab OT Goals Patient Stated Goal: breathe better and go home  Plan Discharge plan remains appropriate    Co-evaluation                 AM-PAC OT "6 Clicks" Daily Activity     Outcome Measure   Help from another person eating meals?: None Help from another person taking care of personal grooming?: A Little Help from another person toileting, which includes using toliet, bedpan, or urinal?: A Little Help from another person bathing (including washing,  rinsing, drying)?: A Little Help from another person to put on and taking off regular upper body clothing?: None Help from another person to put on and taking off regular lower body clothing?: A Little 6 Click Score: 20    End of Session    OT Visit Diagnosis: Unsteadiness on feet (R26.81);Cognitive communication deficit (R41.841)   Activity Tolerance Patient tolerated treatment well   Patient Left in bed;with call bell/phone within reach   Nurse Communication          Time: 4270-6237 OT Time Calculation (min): 16 min  Charges: OT General Charges $OT Visit: 1 Visit OT Treatments $Self Care/Home Management : 8-22 mins     Britt Bottom 11/09/2020, 3:12 PM

## 2020-11-09 NOTE — Progress Notes (Signed)
PROGRESS NOTE  Antonio [name] JKD:[id number] DOB: Jul 08, 1941 DOA: 10/26/2020 PCP: Antonio Curry, DO  Brief History   Past medical history of B-cell lymphoma, chemotherapy due in 2000, recent COVID-19 infection. Presents again to the hospital with worsening shortness of breath found to have worsening hypoxia as well as likely inflammation worsening from COVID-19 pneumonia.  The patient is now past the 21 day period of isolation and has been transferred out of the [place] room to the floor. He is now saturating 95% on 2L.  The patient has been evaluated by PT. They have recommended SNF placement. The facility will be able to accept the patient tomorrow.  Consultants  . None  Procedures  . None  Antibiotics   Anti-infectives (From admission, onward)   Start     Dose/Rate Route Frequency Ordered Stop   10/26/20 1700  cefTRIAXone (ROCEPHIN) 2 g in sodium chloride 0.9 % 100 mL IVPB  Status:  Discontinued        2 g 200 mL/hr over 30 Minutes Intravenous Every 24 hours 10/26/20 1649 10/27/20 1131   10/26/20 1700  azithromycin (ZITHROMAX) 500 mg in sodium chloride 0.9 % 250 mL IVPB  Status:  Discontinued        500 mg 250 mL/hr over 60 Minutes Intravenous Every 24 hours 10/26/20 1649 10/27/20 1131      Subjective  The patient is resting comfortably. No new complaints.  Objective   Vitals:  Vitals:   11/09/20 1218 11/09/20 1649  BP: 104/73 109/78  Pulse: 91 92  Resp:  20  Temp: (!) 97.2 F (36.2 C) 97.6 F (36.4 C)  SpO2: 95% 98%   Exam:  Constitutional:  . The patient is awake, alert, and oriented x 3. No acute distress. Respiratory:  . No increased work of breathing. . No wheezes, rales, or rhonchi . No tactile fremitus Cardiovascular:  . Regular rate and rhythm . No murmurs, ectopy, or gallups. . No lateral PMI. No thrills. Abdomen:  . Abdomen is soft, non-tender, non-distended . No hernias, masses, or organomegaly . Normoactive bowel sounds.  Musculoskeletal:   . No cyanosis, clubbing, or edema Skin:  . No rashes, lesions, ulcers . palpation of skin: no induration or nodules Neurologic:  . CN 2-12 intact . Sensation all 4 extremities intact Psychiatric:  . Mental status o Mood, affect appropriate o Orientation to person, place, time  . judgment and insight appear intact  I have personally reviewed the following:   Today's Data  . Vitals, BMP, CBC  Micro Data  . Blood culture x 2  Imaging  . CXR 11/02/2020 . Abd Film 11/02/2020 . CTA chest 10/26/2020  Cardiology Data  . EKG 10/27/2020 . Echocardiogram: EF 50% with low normal function of the LV. No regional wall motion abnormalities. There is mild left ventricular hypertrophy. There is evidence for Grade 1 diastolic dysfunction. RV function is mildly reduced.   Scheduled Meds: . vitamin C  500 mg Oral Daily  . benzonatate  100 mg Oral TID  . Chlorhexidine Gluconate Cloth  6 each Topical Daily  . chlorpheniramine-HYDROcodone  5 mL Oral Q12H  . enoxaparin (LOVENOX) injection  40 mg Subcutaneous Q24H  . famotidine  20 mg Oral Daily  . feeding supplement  237 mL Oral TID BM  . fluticasone  2 spray Each Nare Daily  . guaiFENesin  600 mg Oral BID  . lactulose  10 g Oral Daily  . mometasone-formoterol  2 puff Inhalation BID  . multivitamin with  minerals  1 tablet Oral Daily  . oxymetazoline  1 spray Each Nare BID  . polyethylene glycol  17 g Oral Daily  . predniSONE  20 mg Oral Q breakfast  . senna-docusate  1 tablet Oral BID  . simvastatin  40 mg Oral q1800  . zinc sulfate  220 mg Oral Daily   Principal Problem:   Acute respiratory failure with hypoxia (HCC) Active Problems:   Essential hypertension   Diffuse large B cell lymphoma (HCC)   Acute hypoxemic respiratory failure due to COVID-19 [name])   Mixed hyperlipidemia   Acute metabolic encephalopathy   Lobar pneumonia (HCC)   LOS: 14 days   A & P   Acute Hypoxic Resp. Failure/Pneumonia due to COVID-19: Respiratory  status is trending towards baseline. Today he is saturating 98% on 5L by [place]. The patient has completed the following in terms of COVID-19 therapeutics: Antibacterials: None Remdesivir: Completed 5-day course during previous hospitalization Steroids: Prednisone is being weaned down. Diuretics: Patient was given furosemide the last 2 days.  Will give additional dose today. Inhaled Steroids: Dulera Actemra/Baricitinib: Not given yet PUD Prophylaxis: Pepcid DVT Prophylaxis:  Lovenox Tested positive on 1/4. Inflammatory markers are steadily improving.   History of B-cell lymphoma: Outpatient follow-up with medical oncology  Essential hypertension/Chronic HFpEF: Blood pressure is reasonably well controlled. Continue to monitor. Echocardiogram shows preserved EF.   Mild hyperkalemia: Resolved  Hyperlipidemia: Continue statin  Leukocytosis: Due to steroids   Severe constipation: Continue with bowel regimen.  I have seen and examined this patient myself. I have spent 30 minutes in his evaluation and care.  DVT Prophylaxis:   enoxaparin (LOVENOX) injection 40 mg Start: 10/26/20 1715 Advance goals of care discussion: DNR.   Family Communication: Wife being updated daily Disposition: The patient will discharge to SNF tomorrow.  Antonio Smolenski, DO [facility] Direct contact: see [email or web address]  7PM-7AM contact night coverage as above 11/09/2020, 5:29 PM  LOS: 12 days

## 2020-11-09 NOTE — Progress Notes (Signed)
Physical Therapy Treatment Patient Details Name: Antonio [name] MRN: [id number] DOB: 08/30/1941 Today's Date: 11/09/2020    History of Present Illness Pt is an 80 year old male who is vaccinated against COVID-19 with past medical history of left bundle branch block, essential tremor, prior melanoma, right testicular large B-cell lymphoma , hypertension, hyperlipidemia, MGUS who returned to the hospital on 10/26/20 with acute respiratory failure with hypoxia. Pt was discharged on 10/23/20 after admission for COVID-19.    PT Comments    Pt now off covid precautions and able to amb in hallway. Able to incr amb to >500' using rollator for part of it and no device for part of it. Amb on 4L of O2 with SpO2 >90%. HR continues to elevate to 130's with amb.   Follow Up Recommendations  Home health PT;Supervision for mobility/OOB     Equipment Recommendations  Other (comment) (possibly rollator)    Recommendations for Other Services       Precautions / Restrictions Precautions Precautions: Fall    Mobility  Bed Mobility           Sit to supine: Modified independent (Device/Increase time);HOB elevated   General bed mobility comments: Pt up in chair  Transfers Overall transfer level: Modified independent Equipment used: None Transfers: Sit to/from [facility] Sit to Stand: Modified independent (Device/Increase time) Stand pivot transfers: Modified independent (Device/Increase time)          Ambulation/Gait Ambulation/Gait assistance: Supervision Gait Distance (Feet): 525 Feet Assistive device: 4-wheeled walker;None Gait Pattern/deviations: Step-through pattern;Decreased stride length Gait velocity: decr Gait velocity interpretation: 1.31 - 2.62 ft/sec, indicative of limited community ambulator General Gait Details: Steady gait using rollator. Without rollator no loss of balance but gait not as confident   Stairs             Wheelchair Mobility     Modified Rankin (Stroke Patients Only)       Balance Overall balance assessment: Needs assistance Sitting-balance support: No upper extremity supported Sitting balance-Leahy Scale: Good     Standing balance support: During functional activity;No upper extremity supported Standing balance-Leahy Scale: Fair                              Cognition Arousal/Alertness: Awake/alert Behavior During Therapy: WFL for tasks assessed/performed Overall Cognitive Status: No family/caregiver present to determine baseline cognitive functioning Area of Impairment: Memory                     Memory: Decreased short-term memory                Exercises Other Exercises Other Exercises: reviewed incentive spirometer x 5    General Comments General comments (skin integrity, edema, etc.): Amb on 4L with SpO2 >90%. HR to 130's with amb      Pertinent Vitals/Pain      Home Living                      Prior Function            PT Goals (current goals can now be found in the care plan section) Acute Rehab PT Goals Patient Stated Goal: breathe better Progress towards PT goals: Progressing toward goals    Frequency    Min 3X/week      PT Plan Current plan remains appropriate    Co-evaluation  AM-PAC PT "6 Clicks" Mobility   Outcome Measure  Help needed turning from your back to your side while in a flat bed without using bedrails?: None Help needed moving from lying on your back to sitting on the side of a flat bed without using bedrails?: None Help needed moving to and from a bed to a chair (including a wheelchair)?: None Help needed standing up from a chair using your arms (e.g., wheelchair or bedside chair)?: None Help needed to walk in hospital room?: A Little Help needed climbing 3-5 steps with a railing? : A Little 6 Click Score: 22    End of Session Equipment Utilized During Treatment: Oxygen Activity Tolerance:  Patient tolerated treatment well Patient left: with call bell/phone within reach;in chair Nurse Communication: Mobility status PT Visit Diagnosis: Other abnormalities of gait and mobility (R26.89);Muscle weakness (generalized) (M62.81)     Time: 7371-0626 PT Time Calculation (min) (ACUTE ONLY): 21 min  Charges:  $Gait Training: 8-22 mins                     [place] Pager [phone] Office [place] 11/09/2020, 11:40 AM

## 2020-11-09 NOTE — Care Management Important Message (Signed)
Important Message  Patient Details  Name: Antonio [name] MRN: [id number] Date of Birth: 06/06/1941   Medicare Important Message Given:  Yes     Shelda Altes 11/09/2020, 3:52 PM

## 2020-11-09 NOTE — TOC Progression Note (Signed)
Transition of Care [facility]) - Progression Note    Patient Details  Name: Antonio [name] MRN: [id number] Date of Birth: [birth year]  Transition of Care [facility]) CM/SW [place], [place] Phone Number: 11/09/2020, 11:53 AM  Clinical Narrative:     CSW spoke with [facility] who confirmed that they can accept patient for rehab when medically ready. Per [place] at [employer] does not require insurance authorization.for patient to go to short term rehab.  Patient has SN[facility] bed [facility].  CSW will continue to follow.    Expected Discharge Plan: [place] Barriers to Discharge: Continued Medical Work up  Expected Discharge Plan and Services Expected Discharge Plan: [place] In-house Referral: Clinical Social Work     Living arrangements for the past 2 months: Single Family Home                                       Social Determinants of Health (SDOH) Interventions    Readmission Risk Interventions No flowsheet data found.

## 2020-11-09 NOTE — Progress Notes (Signed)
PROGRESS NOTE  Antonio [name] EXH:[id number] DOB: [birth year] DOA: 10/26/2020 PCP: [name], DO  Brief History   Past medical history of B-cell lymphoma, chemotherapy due in 2000, recent COVID-19 infection. Presents again to the hospital with worsening shortness of breath found to have worsening hypoxia as well as likely inflammation worsening from COVID-19 pneumonia.  The patient is now past the 21 day period of isolation and has been transferred out of the [place] room to the floor. He is now saturating 95% on 2L.  The patient has been evaluated by PT. They have recommended SNF placement.   Consultants  . None  Procedures  . None  Antibiotics   Anti-infectives (From admission, onward)   Start     Dose/Rate Route Frequency Ordered Stop   10/26/20 1700  cefTRIAXone (ROCEPHIN) 2 g in sodium chloride 0.9 % 100 mL IVPB  Status:  Discontinued        2 g 200 mL/hr over 30 Minutes Intravenous Every 24 hours 10/26/20 1649 10/27/20 1131   10/26/20 1700  azithromycin (ZITHROMAX) 500 mg in sodium chloride 0.9 % 250 mL IVPB  Status:  Discontinued        500 mg 250 mL/hr over 60 Minutes Intravenous Every 24 hours 10/26/20 1649 10/27/20 1131      Subjective  The patient is resting comfortably. No new complaints.  Objective   Vitals:  Vitals:   11/09/20 1218 11/09/20 1649  BP: 104/73 109/78  Pulse: 91 92  Resp:  20  Temp: (!) 97.2 F (36.2 C) 97.6 F (36.4 C)  SpO2: 95% 98%   Exam:  Constitutional:  . The patient is awake, alert, and oriented x 3. No acute distress. Respiratory:  . No increased work of breathing. . No wheezes, rales, or rhonchi . No tactile fremitus Cardiovascular:  . Regular rate and rhythm . No murmurs, ectopy, or gallups. . No lateral PMI. No thrills. Abdomen:  . Abdomen is soft, non-tender, non-distended . No hernias, masses, or organomegaly . Normoactive bowel sounds.  Musculoskeletal:  . No cyanosis, clubbing, or edema Skin:  . No rashes,  lesions, ulcers . palpation of skin: no induration or nodules Neurologic:  . CN 2-12 intact . Sensation all 4 extremities intact Psychiatric:  . Mental status o Mood, affect appropriate o Orientation to person, place, time  . judgment and insight appear intact  I have personally reviewed the following:   Today's Data  . Vitals, BMP, CBC  Micro Data  . Blood culture x 2  Imaging  . CXR 11/02/2020 . Abd Film 11/02/2020 . CTA chest 10/26/2020  Cardiology Data  . EKG 10/27/2020 . Echocardiogram: EF 50% with low normal function of the LV. No regional wall motion abnormalities. There is mild left ventricular hypertrophy. There is evidence for Grade 1 diastolic dysfunction. RV function is mildly reduced.   Scheduled Meds: . vitamin C  500 mg Oral Daily  . benzonatate  100 mg Oral TID  . Chlorhexidine Gluconate Cloth  6 each Topical Daily  . chlorpheniramine-HYDROcodone  5 mL Oral Q12H  . enoxaparin (LOVENOX) injection  40 mg Subcutaneous Q24H  . famotidine  20 mg Oral Daily  . feeding supplement  237 mL Oral TID BM  . fluticasone  2 spray Each Nare Daily  . guaiFENesin  600 mg Oral BID  . lactulose  10 g Oral Daily  . mometasone-formoterol  2 puff Inhalation BID  . multivitamin with minerals  1 tablet Oral Daily  . oxymetazoline  1 spray Each Nare BID  . polyethylene glycol  17 g Oral Daily  . predniSONE  20 mg Oral Q breakfast  . senna-docusate  1 tablet Oral BID  . simvastatin  40 mg Oral q1800  . zinc sulfate  220 mg Oral Daily   Principal Problem:   Acute respiratory failure with hypoxia (HCC) Active Problems:   Essential hypertension   Diffuse large B cell lymphoma (HCC)   Acute hypoxemic respiratory failure due to COVID-19 [facility])   Mixed hyperlipidemia   Acute metabolic encephalopathy   Lobar pneumonia (HCC)   LOS: 14 days   A & P   Acute Hypoxic Resp. Failure/Pneumonia due to COVID-19: Respiratory status is trending towards baseline. Today he is saturating 98%  on 5L by [place]. The patient has completed the following in terms of COVID-19 therapeutics: Antibacterials: None Remdesivir: Completed 5-day course during previous hospitalization Steroids: Prednisone is being weaned down. Diuretics: Patient was given furosemide the last 2 days.  Will give additional dose today. Inhaled Steroids: Dulera Actemra/Baricitinib: Not given yet PUD Prophylaxis: Pepcid DVT Prophylaxis:  Lovenox Tested positive on 1/4. Inflammatory markers are steadily improving.   History of B-cell lymphoma: Outpatient follow-up with medical oncology  Essential hypertension/Chronic HFpEF: Blood pressure is reasonably well controlled. Continue to monitor. Echocardiogram shows preserved EF.   Mild hyperkalemia: Resolved  Hyperlipidemia: Continue statin  Leukocytosis: Due to steroids   Severe constipation: Continue with bowel regimen.  I have seen and examined this patient myself. I have spent 28 minutes in his evaluation and care.  DVT Prophylaxis:   enoxaparin (LOVENOX) injection 40 mg Start: 10/26/20 1715 Advance goals of care discussion: DNR.   Family Communication: Wife being updated daily Disposition: The patient will discharge to SNF tomorrow.  Olukemi Panchal, DO [facility] Direct contact: see [email or web address]  7PM-7AM contact night coverage as above 11/09/2020, 5:29 PM  LOS: 12 days

## 2020-11-10 DIAGNOSIS — I471 Supraventricular tachycardia: Secondary | ICD-10-CM

## 2020-11-10 LAB — BASIC METABOLIC PANEL
Anion gap: 11 (ref 5–15)
BUN: 34 mg/dL — ABNORMAL HIGH (ref 8–23)
CO2: 32 mmol/L (ref 22–32)
Calcium: 8.9 mg/dL (ref 8.9–10.3)
Chloride: 99 mmol/L (ref 98–111)
Creatinine, Ser: 0.9 mg/dL (ref 0.61–1.24)
GFR, Estimated: >60 mL/min (ref >60)
Glucose, Bld: 94 mg/dL (ref 70–99)
Potassium: 3.9 mmol/L (ref 3.5–5.1)
Sodium: 142 mmol/L (ref 135–145)

## 2020-11-10 LAB — MAGNESIUM: Magnesium: 2.4 mg/dL (ref 1.7–2.4)

## 2020-11-10 MED ORDER — ZINC SULFATE 220 (50 ZN) MG PO CAPS: 220.0000 mg | ORAL_CAPSULE | Freq: Every day | ORAL | 0 refills | Status: DC

## 2020-11-10 MED ORDER — IPRATROPIUM-ALBUTEROL 0.5-2.5 (3) MG/3ML IN SOLN: 3.0000 mL | Freq: Four times a day (QID) | RESPIRATORY_TRACT | 0 refills | Status: DC

## 2020-11-10 MED ORDER — ENSURE ENLIVE PO LIQD: 237.0000 mL | Freq: Three times a day (TID) | ORAL | 12 refills | Status: DC

## 2020-11-10 MED ORDER — LACTULOSE 10 GM/15ML PO SOLN: 10.0000 g | Freq: Every day | ORAL | 0 refills | Status: DC

## 2020-11-10 MED ORDER — PREDNISONE 20 MG PO TABS: ORAL_TABLET | ORAL | 0 refills | Status: AC

## 2020-11-10 MED ORDER — METOPROLOL TARTRATE 12.5 MG HALF TABLET
12.5000 mg | ORAL_TABLET | Freq: Two times a day (BID) | ORAL | Status: DC
  Administered 2020-11-10 – 2020-11-11 (×3): 12.5 mg via ORAL
  Filled 2020-11-10 (×3): qty 1

## 2020-11-10 MED ORDER — POLYETHYLENE GLYCOL 3350 17 G PO PACK: 17.0000 g | PACK | Freq: Every day | ORAL | 0 refills | Status: DC

## 2020-11-10 MED ORDER — METOPROLOL TARTRATE 25 MG PO TABS: 12.5000 mg | ORAL_TABLET | Freq: Two times a day (BID) | ORAL | 0 refills | Status: DC

## 2020-11-10 MED ORDER — SENNOSIDES-DOCUSATE SODIUM 8.6-50 MG PO TABS: 1.0000 | ORAL_TABLET | Freq: Two times a day (BID) | ORAL | 0 refills | Status: DC

## 2020-11-10 MED ORDER — FAMOTIDINE 20 MG PO TABS: 20.0000 mg | ORAL_TABLET | Freq: Every day | ORAL | 0 refills | Status: DC

## 2020-11-10 MED ORDER — FLUTICASONE PROPIONATE 50 MCG/ACT NA SUSP: 2.0000 | Freq: Every day | NASAL | 0 refills | Status: DC

## 2020-11-10 MED ORDER — GUAIFENESIN ER 600 MG PO TB12: 600.0000 mg | ORAL_TABLET | Freq: Two times a day (BID) | ORAL | 0 refills | Status: DC

## 2020-11-10 NOTE — Significant Event (Signed)
Rapid Response Event Note   Reason for Call :  SVT, HR 200s  Initial Focused Assessment:  Pt lying in bed, AO. Skin is warm, moist. Lung sounds are clear, diminished in the bases. Heart rate is regular, he is now NSR. No adventitious heart sounds. He denies pain, lightheadedness, or dizziness. He states he was using urinating during the time his heart rate was elevated. He denies straining at that time or strenuous movements.   Review of cardiac monitor shows he sustained a HR of 198-204 bpm from [phone].   Interventions:  -EKG- pt at this time had spontaneously converted back to NSR  Plan of Care:  -New orders received for 12.5mg  Lopressor PO BID and to check his Magnesium level  Call rapid response for additional needs  Event Summary:  MD Notified: Dr. Benny Lennert  Call Time: 0935 Arrival Time: 0940 End Time: [place], RN

## 2020-11-10 NOTE — Progress Notes (Addendum)
Physical Therapy Treatment Patient Details Name: Antonio [name] MRN: [id number] DOB: 1941/02/10 Today's Date: 11/10/2020    History of Present Illness Pt is an 80 year old male who is vaccinated against COVID-19 with past medical history of left bundle branch block, essential tremor, prior melanoma, right testicular large B-cell lymphoma , hypertension, hyperlipidemia, MGUS who returned to the hospital on 10/26/20 with acute respiratory failure with hypoxia. Pt was discharged on 10/23/20 after admission for COVID-19.    PT Comments    Pt continues to improve with activity tolerance and mobility. Pt with better HR control today with activity. Supplemental O2 down to 3L with SpO2 > 90%. Recommend at DC that pt continue to use rollator or rolling walker and continue to work on more balance activities. [facility] plans to place him in their short term rehab when he leaves hospital.    Follow Up Recommendations  Home health PT;Supervision for mobility/OOB     Equipment Recommendations  Other (comment) (possibly rollator)    Recommendations for Other Services       Precautions / Restrictions Precautions Precautions: Fall    Mobility  Bed Mobility Overal bed mobility: Modified Independent                Transfers Overall transfer level: Modified independent Equipment used: None Transfers: Sit to/from Stand Sit to Stand: Modified independent (Device/Increase time)            Ambulation/Gait Ambulation/Gait assistance: Supervision;Min guard Gait Distance (Feet): 800 Feet Assistive device: 4-wheeled walker;None Gait Pattern/deviations: Step-through pattern;Decreased stride length Gait velocity: decr Gait velocity interpretation: 1.31 - 2.62 ft/sec, indicative of limited community ambulator General Gait Details: Steady gait using rollator. Without rollator some instability requiring min guard for safety   Stairs             Wheelchair Mobility    Modified Rankin  (Stroke Patients Only)       Balance Overall balance assessment: Needs assistance Sitting-balance support: No upper extremity supported Sitting balance-Leahy Scale: Good     Standing balance support: During functional activity;No upper extremity supported Standing balance-Leahy Scale: Fair                              Cognition Arousal/Alertness: Awake/alert Behavior During Therapy: WFL for tasks assessed/performed Overall Cognitive Status: No family/caregiver present to determine baseline cognitive functioning Area of Impairment: Memory                     Memory: Decreased short-term memory                Exercises Other Exercises Other Exercises: Incentive spirometer x 5    General Comments General comments (skin integrity, edema, etc.): Amb on 3L with SpO2 >90% and HR 105.      Pertinent Vitals/Pain Pain Assessment: No/denies pain    Home Living                      Prior Function            PT Goals (current goals can now be found in the care plan section) Acute Rehab PT Goals Patient Stated Goal: breathe better Progress towards PT goals: Progressing toward goals    Frequency    Min 3X/week      PT Plan Current plan remains appropriate    Co-evaluation              AM-PAC  PT "6 Clicks" Mobility   Outcome Measure  Help needed turning from your back to your side while in a flat bed without using bedrails?: None Help needed moving from lying on your back to sitting on the side of a flat bed without using bedrails?: None Help needed moving to and from a bed to a chair (including a wheelchair)?: None Help needed standing up from a chair using your arms (e.g., wheelchair or bedside chair)?: None Help needed to walk in hospital room?: A Little Help needed climbing 3-5 steps with a railing? : A Little 6 Click Score: 22    End of Session Equipment Utilized During Treatment: Oxygen Activity Tolerance: Patient  tolerated treatment well Patient left: with call bell/phone within reach;in chair Nurse Communication: Mobility status PT Visit Diagnosis: Other abnormalities of gait and mobility (R26.89);Muscle weakness (generalized) (M62.81)     Time: 1975-8832 PT Time Calculation (min) (ACUTE ONLY): 22 min  Charges:  $Gait Training: 8-22 mins                     [place] Pager [phone] Office [place] 11/10/2020, 4:24 PM

## 2020-11-10 NOTE — Progress Notes (Signed)
PROGRESS NOTE  Antonio [name] HKV:[id number] DOB: [birth year] DOA: 10/26/2020 PCP: [name], DO  Brief History   Past medical history of B-cell lymphoma, chemotherapy due in 2000, recent COVID-19 infection. Presents again to the hospital with worsening shortness of breath found to have worsening hypoxia as well as likely inflammation worsening from COVID-19 pneumonia.  The patient is now past the 21 day period of isolation and has been transferred out of the [place] room to the floor. He is now saturating 95% on 2L.  The patient has been evaluated by PT. They have recommended SNF placement. The facility was to have accepted the patient on 11/10/2020, but in the morning the patient had an episode of symptomatic SVT. He converted back to sinus quickly. Magnesium and potassium were within normal limits. I have added metoprolol 12.5 mg bid to prevent recurrences.   It was for this reason that the patient's discharge to SNF was delayed.  Consultants  . None  Procedures  . None  Antibiotics   Anti-infectives (From admission, onward)   Start     Dose/Rate Route Frequency Ordered Stop   10/26/20 1700  cefTRIAXone (ROCEPHIN) 2 g in sodium chloride 0.9 % 100 mL IVPB  Status:  Discontinued        2 g 200 mL/hr over 30 Minutes Intravenous Every 24 hours 10/26/20 1649 10/27/20 1131   10/26/20 1700  azithromycin (ZITHROMAX) 500 mg in sodium chloride 0.9 % 250 mL IVPB  Status:  Discontinued        500 mg 250 mL/hr over 60 Minutes Intravenous Every 24 hours 10/26/20 1649 10/27/20 1131      Subjective  The patient is resting comfortably. No new complaints.  Objective   Vitals:  Vitals:   11/10/20 0948 11/10/20 1006  BP:  114/79  Pulse: 93 94  Resp: 20   Temp:    SpO2: 93%    Exam:  Constitutional:  . The patient is awake, alert, and oriented x 3. No acute distress. Respiratory:  . No increased work of breathing. . No wheezes, rales, or rhonchi . No tactile  fremitus Cardiovascular:  . Regular rate and rhythm . No murmurs, ectopy, or gallups. . No lateral PMI. No thrills. Abdomen:  . Abdomen is soft, non-tender, non-distended . No hernias, masses, or organomegaly . Normoactive bowel sounds.  Musculoskeletal:  . No cyanosis, clubbing, or edema Skin:  . No rashes, lesions, ulcers . palpation of skin: no induration or nodules Neurologic:  . CN 2-12 intact . Sensation all 4 extremities intact Psychiatric:  . Mental status o Mood, affect appropriate o Orientation to person, place, time  . judgment and insight appear intact  I have personally reviewed the following:   Today's Data  . Vitals, BMP, CBC  Micro Data  . Blood culture x 2  Imaging  . CXR 11/02/2020 . Abd Film 11/02/2020 . CTA chest 10/26/2020  Cardiology Data  . EKG 10/27/2020 . Echocardiogram: EF 50% with low normal function of the LV. No regional wall motion abnormalities. There is mild left ventricular hypertrophy. There is evidence for Grade 1 diastolic dysfunction. RV function is mildly reduced.   Scheduled Meds: . vitamin C  500 mg Oral Daily  . benzonatate  100 mg Oral TID  . Chlorhexidine Gluconate Cloth  6 each Topical Daily  . chlorpheniramine-HYDROcodone  5 mL Oral Q12H  . enoxaparin (LOVENOX) injection  40 mg Subcutaneous Q24H  . famotidine  20 mg Oral Daily  . feeding supplement  237 mL Oral TID BM  . fluticasone  2 spray Each Nare Daily  . guaiFENesin  600 mg Oral BID  . lactulose  10 g Oral Daily  . metoprolol tartrate  12.5 mg Oral BID  . mometasone-formoterol  2 puff Inhalation BID  . multivitamin with minerals  1 tablet Oral Daily  . oxymetazoline  1 spray Each Nare BID  . polyethylene glycol  17 g Oral Daily  . predniSONE  20 mg Oral Q breakfast  . senna-docusate  1 tablet Oral BID  . simvastatin  40 mg Oral q1800  . zinc sulfate  220 mg Oral Daily   Principal Problem:   Acute respiratory failure with hypoxia (HCC) Active Problems:    Essential hypertension   Diffuse large B cell lymphoma (HCC)   Acute hypoxemic respiratory failure due to COVID-19 [facility])   Mixed hyperlipidemia   Acute metabolic encephalopathy   Lobar pneumonia (HCC)   LOS: 15 days   A & P   Acute Hypoxic Resp. Failure/Pneumonia due to COVID-19: Respiratory status is trending towards baseline. Today he is saturating 98% on 5L by [place]. The patient has completed the following in terms of COVID-19 therapeutics: Antibacterials: None Remdesivir: Completed 5-day course during previous hospitalization Steroids: Prednisone is being weaned down. Diuretics: Patient was given furosemide the last 2 days.  Will give additional dose today. Inhaled Steroids: Dulera Actemra/Baricitinib: Not given yet PUD Prophylaxis: Pepcid DVT Prophylaxis:  Lovenox Tested positive on 1/4. Inflammatory markers are steadily improving.   History of B-cell lymphoma: Outpatient follow-up with medical oncology  SVT: Symptomatic. Rhythm reverted to sinus quickly. Magnesium and potassium were within normal limits. I have added metoprolol 12.5 mg bid to prevent recurrences.   Essential hypertension/Chronic HFpEF: Blood pressure is reasonably well controlled. Continue to monitor. Echocardiogram shows preserved EF.   Mild hyperkalemia: Resolved  Hyperlipidemia: Continue statin  Leukocytosis: Due to steroids   Severe constipation: Continue with bowel regimen.  I have seen and examined this patient myself. I have spent 36 minutes in his evaluation and care.  DVT Prophylaxis:   enoxaparin (LOVENOX) injection 40 mg Start: 10/26/20 1715 Advance goals of care discussion: DNR.   Family Communication: Wife being updated daily Disposition: The patient will discharge to SNF tomorrow.  Avanthika Dehnert, DO [facility] Direct contact: see [email or web address]  7PM-7AM contact night coverage as above 11/10/2020, 4:33 PM  LOS: 12 days

## 2020-11-11 MED ORDER — HEPARIN SOD (PORK) LOCK FLUSH 100 UNIT/ML IV SOLN
500.0000 [IU] | INTRAVENOUS | Status: AC | PRN
  Administered 2020-11-11: 500 [IU]
  Filled 2020-11-11: qty 5

## 2020-11-11 NOTE — TOC Transition Note (Signed)
Transition of Care [facility]) - CM/SW Discharge Note   Patient Details  Name: Antonio [name] MRN: [id number] Date of Birth: 12-26-1940  [facility] CM/SW Contact:  Vinie Sill, [place] Phone Number: 11/11/2020, 12:31 PM   Clinical Narrative:     Patient will Discharge to: [facility] Discharge Date: 11/11/2020 Family Notified: Karen,daugter Transport By: [place]    Per MD patient is ready for discharge. RN, patient, and facility notified of DC. Discharge Summary sent to facility. RN given number for [phone]. Ambulance transport requested for patient.   Clinical Social Worker signing off.  Thurmond Butts, MSW, LCSW Clinical Social Worker   Final next level of care: Skilled Nursing Facility Barriers to Discharge: Barriers Resolved   Patient Goals and CMS Choice   CMS Medicare.gov Compare Post Acute Care list provided to:: Patient Represenative (must comment) [facility] or [facility]) Choice offered to / list presented to : Adult Children [facility])  Discharge Placement              Patient chooses bed at: [facility] Patient to be transferred to facility by: [facility] Name of family member notified: daughter,Karen Patient and family notified of of transfer: 11/11/20  Discharge Plan and Services In-house Referral: Clinical Social Work                                   Social Determinants of Health (SDOH) Interventions     Readmission Risk Interventions No flowsheet data found.

## 2020-11-11 NOTE — Progress Notes (Signed)
Mrs. Monette notified that Mr. Snowdon laptop cord is at the desk, states she will try to come today to pick up.

## 2020-11-11 NOTE — Progress Notes (Signed)
Occupational Therapy Treatment Patient Details Name: Antonio [name] MRN: [id number] DOB: 1941/05/15 Today's Date: 11/11/2020    History of present illness Pt is an 80 year old male who is vaccinated against COVID-19 with past medical history of left bundle branch block, essential tremor, prior melanoma, right testicular large B-cell lymphoma , hypertension, hyperlipidemia, MGUS who returned to the hospital on 10/26/20 with acute respiratory failure with hypoxia. Pt was discharged on 10/23/20 after admission for COVID-19.   OT comments  Pt eager to discharge today. Performed ADL at sink with set up to mod assist. Pt with stable VS on 1.5L 02. HR mid 80s.  Follow Up Recommendations  Home health OT;Supervision/Assistance - 24 hour (plans to go to healthcare section at [facility])    Equipment Recommendations  None recommended by OT    Recommendations for Other Services      Precautions / Restrictions Precautions Precautions: Fall       Mobility Bed Mobility Overal bed mobility: Modified Independent             General bed mobility comments: HOB up  Transfers Overall transfer level: Needs assistance Equipment used: None Transfers: Sit to/from Stand Sit to Stand: Supervision         General transfer comment: supervision for line and safety    Balance Overall balance assessment: Needs assistance   Sitting balance-Leahy Scale: Good       Standing balance-Leahy Scale: Fair                             ADL either performed or assessed with clinical judgement   ADL Overall ADL's : Needs assistance/impaired     Grooming: Wash/dry hands;Wash/dry face;Oral care;Sitting;Set up   Upper Body Bathing: Moderate assistance;Sitting   Lower Body Bathing: Min guard;Set up;Sit to/from stand   Upper Body Dressing : Set up;Sitting                   Functional mobility during ADLs: Min guard General ADL Comments: Sp02 95% on 1.5L     Vision        Perception     Praxis      Cognition Arousal/Alertness: Awake/alert Behavior During Therapy: Impulsive Overall Cognitive Status: Impaired/Different from baseline Area of Impairment: Memory                     Memory: Decreased short-term memory         General Comments: cues for sequencing        Exercises     Shoulder Instructions       General Comments      Pertinent Vitals/ Pain       Pain Assessment: No/denies pain  Home Living                                          Prior Functioning/Environment              Frequency  Min 2X/week        Progress Toward Goals  OT Goals(current goals can now be found in the care plan section)  Progress towards OT goals: Progressing toward goals  Acute Rehab OT Goals Patient Stated Goal: breathe better OT Goal Formulation: With patient Time For Goal Achievement: 11/12/20 Potential to Achieve Goals: Good  Plan Discharge plan remains appropriate  Co-evaluation                 AM-PAC OT "6 Clicks" Daily Activity     Outcome Measure   Help from another person eating meals?: None Help from another person taking care of personal grooming?: A Little Help from another person toileting, which includes using toliet, bedpan, or urinal?: A Little Help from another person bathing (including washing, rinsing, drying)?: A Little Help from another person to put on and taking off regular upper body clothing?: None Help from another person to put on and taking off regular lower body clothing?: A Little 6 Click Score: 20    End of Session Equipment Utilized During Treatment: Oxygen  OT Visit Diagnosis: Unsteadiness on feet (R26.81);Cognitive communication deficit (R41.841)   Activity Tolerance Patient tolerated treatment well   Patient Left in bed;with call bell/phone within reach   Nurse Communication          Time: 4818-5631 OT Time Calculation (min): 54 min  Charges: OT  General Charges $OT Visit: 1 Visit OT Treatments $Self Care/Home Management : 53-67 mins  Nestor Lewandowsky, OTR/L Acute Rehabilitation Services Pager: [phone] Office: [phone]   Malka So 11/11/2020, 10:46 AM

## 2020-11-11 NOTE — Plan of Care (Signed)
  Problem: Education: Goal: Knowledge of General Education information will improve Description: Including pain rating scale, medication(s)/side effects and non-pharmacologic comfort measures Outcome: Progressing   Problem: Clinical Measurements: Goal: Respiratory complications will improve Outcome: Progressing   Problem: Activity: Goal: Risk for activity intolerance will decrease Outcome: Progressing   Problem: Nutrition: Goal: Adequate nutrition will be maintained Outcome: Progressing   Problem: Coping: Goal: Level of anxiety will decrease Outcome: Not Applicable   Problem: Elimination: Goal: Will not experience complications related to bowel motility Outcome: Completed/Met Goal: Will not experience complications related to urinary retention Outcome: Completed/Met   Problem: Pain Managment: Goal: General experience of comfort will improve Outcome: Completed/Met   Problem: Safety: Goal: Ability to remain free from injury will improve Outcome: Progressing   Problem: Skin Integrity: Goal: Risk for impaired skin integrity will decrease Outcome: Progressing   Problem: Education: Goal: Knowledge of risk factors and measures for prevention of condition will improve Outcome: Progressing   Problem: Coping: Goal: Psychosocial and spiritual needs will be supported Outcome: Progressing   Problem: Respiratory: Goal: Will maintain a patent airway Outcome: Progressing Goal: Complications related to the disease process, condition or treatment will be avoided or minimized Outcome: Progressing

## 2020-11-11 NOTE — Progress Notes (Signed)
Report called to [facility] RN [facility]).  RN also informed that patient forgot laptop cord.

## 2020-11-11 NOTE — Discharge Summary (Signed)
Physician Discharge Summary  Antonio [name] [id number] DOB: 1941/04/23 DOA: 10/26/2020  PCP: [name], DO  Admit date: 10/26/2020 Discharge date: 11/11/2020  Admitted From: Home Disposition: SNF  Recommendations for Outpatient Follow-up:  1. Follow up with PCP in 1-2 weeks 2. Please obtain BMP/CBC in one week your next doctors visit.  3. Continue oral prednisone 20 mg until 1/27 4. Metoprolol 12.5 mg twice daily 5.  Discharge Condition: Stable CODE STATUS: DNR Diet recommendation: Heart healthy  Brief/Interim Summary: 80 year old with history of B-cell lymphoma, recent COVID-19 infection admitted for worsening hypoxic respiratory failure secondary to COVID-19 pneumonia.  Initially tested positive for COVID-19 on 10/18/2020.  Patient has past 21 days of isolation and transferred out.  Currently on 95 % 2 L nasal cannula.  PT recommended SNF but prior to placement developed SVT but quickly converted back to normal sinus rhythm Metoprolol 12.5 mg twice daily was started.  Patient tolerated this well.  Stable for discharge to SNF.  He is very eager to leave to get his rehab started, answered all questions.    Acute on chronic hypoxic respiratory failure secondary to recent COVID-19 infection -Patient still remains on 2 L of nasal cannula.  Continue supplemental oxygen as needed. -Completed course for remdesivir -Prednisone 20 mg daily, last day 1/27 -Continue supportive care and bronchodilators as necessary  SVT -Resolved.  Recent echocardiogram EF 50 %.  Metoprolol 12.5 mg twice daily.  Monitor electrolytes  Congestive heart failure preserved ejection fraction, 50 % -Currently appears to be euvolemic.  Supportive care  Hyperlipidemia -Statin  Severe constipation -Bowel regimen  B-cell lymphoma-follow-up outpatient oncology   Body mass index is 23.77 kg/m.         Discharge Diagnoses:  Principal Problem:   Acute respiratory failure with hypoxia  (HCC) Active Problems:   Essential hypertension   Diffuse large B cell lymphoma (HCC)   Acute hypoxemic respiratory failure due to COVID-19 [facility])   Mixed hyperlipidemia   Acute metabolic encephalopathy   Lobar pneumonia (HCC)     Subjective: Feels great no complaints.  Wishes to be discharged today.  Discharge Exam: Vitals:   11/11/20 0900 11/11/20 1000  BP: 112/70 113/69  Pulse: 85 97  Resp: 17   Temp:    SpO2: 95%    Vitals:   11/11/20 0442 11/11/20 0831 11/11/20 0900 11/11/20 1000  BP: 104/67  112/70 113/69  Pulse: 69  85 97  Resp: 13  17   Temp: (!) 97.4 F (36.3 C)     TempSrc: Oral     SpO2: 96% 97% 95%   Weight: 79.5 kg     Height:        General: Pt is alert, awake, not in acute distress Cardiovascular: RRR, S1/S2 +, no rubs, no gallops Respiratory: CTA bilaterally, no wheezing, no rhonchi Abdominal: Soft, NT, ND, bowel sounds + Extremities: no edema, no cyanosis  Discharge Instructions   Allergies as of 11/11/2020      Reactions   Latex Itching   Latex tape causes itching and redness.      Medication List    STOP taking these medications   meloxicam 7.5 MG tablet Commonly known as: MOBIC   propranolol 10 MG tablet Commonly known as: INDERAL   zinc gluconate 50 MG tablet     TAKE these medications   albuterol 108 (90 Base) MCG/ACT inhaler Commonly known as: VENTOLIN HFA Inhale 2 puffs into the lungs every 6 (six) hours as needed for shortness of  breath.   ascorbic acid 500 MG tablet Commonly known as: VITAMIN C Take 1 tablet (500 mg total) by mouth daily.   B-12 PO Take 1 tablet by mouth daily.   benzonatate 100 MG capsule Commonly known as: TESSALON Take 1 capsule (100 mg total) by mouth 3 (three) times daily as needed for cough.   chlorpheniramine-HYDROcodone 10-8 MG/5ML Suer Commonly known as: Tussionex Pennkinetic ER Take 5 mLs by mouth at bedtime as needed for cough.   Cholecalciferol 100 MCG (4000 UT) Caps Take 1  capsule (4,000 Units total) by mouth daily.   diphenhydramine-acetaminophen 25-500 MG Tabs tablet Commonly known as: TYLENOL PM Take 1 tablet by mouth at bedtime as needed (sleep).   famotidine 20 MG tablet Commonly known as: PEPCID Take 1 tablet (20 mg total) by mouth daily.   feeding supplement Liqd Take 237 mLs by mouth 3 (three) times daily between meals.   fluticasone 50 MCG/ACT nasal spray Commonly known as: FLONASE Place 2 sprays into both nostrils daily.   guaiFENesin 600 MG 12 hr tablet Commonly known as: MUCINEX Take 1 tablet (600 mg total) by mouth 2 (two) times daily.   ipratropium-albuterol 0.5-2.5 (3) MG/3ML Soln Commonly known as: DUONEB Inhale 3 mLs into the lungs every 6 (six) hours.   lactulose 10 GM/15ML solution Commonly known as: CHRONULAC Take 15 mLs (10 g total) by mouth daily.   losartan 100 MG tablet Commonly known as: COZAAR Take 1 tablet (100 mg total) by mouth daily.   metoprolol tartrate 25 MG tablet Commonly known as: LOPRESSOR Take 0.5 tablets (12.5 mg total) by mouth 2 (two) times daily.   polyethylene glycol 17 g packet Commonly known as: MIRALAX / GLYCOLAX Take 17 g by mouth daily.   predniSONE 20 MG tablet Commonly known as: DELTASONE Take 1 tablet (20 mg total) by mouth daily with breakfast for 2 days, THEN 0.5 tablets (10 mg total) daily with breakfast for 3 days. Start taking on: November 11, 2020 What changed:   medication strength  See the new instructions.   PRESERVISION AREDS 2 PO Take 1 capsule by mouth 2 (two) times daily.   REFRESH EYE ITCH RELIEF OP Place 1 drop into both eyes daily as needed (itchy eyes).   senna-docusate 8.6-50 MG tablet Commonly known as: Senokot-S Take 1 tablet by mouth 2 (two) times daily.   simvastatin 40 MG tablet Commonly known as: ZOCOR TAKE 1 TABLET BY MOUTH EVERY DAY What changed: when to take this   Testosterone 20.25 MG/ACT (1.62%) Gel Apply 40.5 mg topically daily.    valACYclovir 1000 MG tablet Commonly known as: Valtrex Take one tablet by mouth twice daily for 3 days as needed for flares. What changed:   how much to take  how to take this  when to take this   zinc sulfate 220 (50 Zn) MG capsule Take 1 capsule (220 mg total) by mouth daily.       Follow-up Information    Reed, Tiffany L, DO. Schedule an appointment as soon as possible for a visit in 1 week(s).   Specialty: Geriatric Medicine Contact information: [address]. [place] [address] [phone]              Allergies  Allergen Reactions  . Latex Itching    Latex tape causes itching and redness.    You were cared for by a hospitalist during your hospital stay. If you have any questions about your discharge medications or the care you  received while you were in the hospital after you are discharged, you can call the unit and asked to speak with the hospitalist on call if the hospitalist that took care of you is not available. Once you are discharged, your primary care physician will handle any further medical issues. Please note that no refills for any discharge medications will be authorized once you are discharged, as it is imperative that you return to your primary care physician (or establish a relationship with a primary care physician if you do not have one) for your aftercare needs so that they can reassess your need for medications and monitor your lab values.   Procedures/Studies: CT CHEST WO CONTRAST  Result Date: 10/20/2020 CLINICAL DATA:  80 year old male with abnormal x-ray and pleural effusion. EXAM: CT CHEST WITHOUT CONTRAST TECHNIQUE: Multidetector CT imaging of the chest was performed following the standard protocol without IV contrast. COMPARISON:  Chest radiograph dated 10/20/2020. FINDINGS: Evaluation of this exam is limited in the absence of intravenous contrast. Cardiovascular: There is no cardiomegaly or pericardial effusion. There is coronary  vascular calcification of the LAD. Mild atherosclerotic calcification of the thoracic aorta. No aneurysmal dilatation. The central pulmonary arteries are grossly unremarkable. Mediastinum/Nodes: There is no hilar or mediastinal adenopathy. The esophagus is grossly unremarkable. No mediastinal fluid collection. Right-sided Port-A-Cath with tip in central SVC close to the cavoatrial junction. Lungs/Pleura: There is eventration of the right hemidiaphragm. There are bibasilar patchy ground-glass densities, right greater than left as well as additional scattered ground-glass pulmonary opacities in the upper lobes most consistent with multifocal pneumonia, likely viral or atypical in etiology including COVID-19. Clinical correlation is recommended. There is no pleural effusion pneumothorax. The central airways are patent. Upper Abdomen: A 3 cm right renal upper pole cyst. Musculoskeletal: No chest wall mass or suspicious bone lesions identified. IMPRESSION: Multifocal pneumonia, likely viral or atypical in etiology. Clinical correlation and follow-up to resolution recommended. Electronically Signed   By: Anner Crete M.D.   On: 10/20/2020 19:35   CT Angio Chest PE W and/or Wo Contrast  Result Date: 10/26/2020 CLINICAL DATA:  Hypoxia, shortness of breath EXAM: CT ANGIOGRAPHY CHEST WITH CONTRAST TECHNIQUE: Multidetector CT imaging of the chest was performed using the standard protocol during bolus administration of intravenous contrast. Multiplanar CT image reconstructions and MIPs were obtained to evaluate the vascular anatomy. CONTRAST:  8mL OMNIPAQUE IOHEXOL 350 MG/ML SOLN COMPARISON:  10/20/2020 FINDINGS: Cardiovascular: Satisfactory opacification of the pulmonary arteries to the segmental level. No evidence of pulmonary embolism. Normal heart size. No pericardial effusion. Mediastinum/Nodes: No enlarged mediastinal, hilar, or axillary lymph nodes. Thyroid gland, trachea, and esophagus demonstrate no  significant findings. Lungs/Pleura: No pleural effusion or pneumothorax. Patchy ground-glass airspace disease in the upper lobe in a peripheral distribution bilaterally, right greater than left. Patchy right lower lobe airspace disease with associated right lower lobe atelectasis. Patchy ground-glass opacities in the right middle lobe and left lower lobe. Upper Abdomen: No acute abnormality. Musculoskeletal: No acute osseous abnormality. No aggressive osseous lesion. Review of the MIP images confirms the above findings. IMPRESSION: 1. No evidence of pulmonary embolus. 2. Bilateral airspace disease with a predominantly peripheral distribution. Findings are concerning for multilobar pneumonia including atypical viral pneumonia. Electronically Signed   By: Kathreen Devoid   On: 10/26/2020 14:52   DG CHEST PORT 1 VIEW  Result Date: 11/02/2020 CLINICAL DATA:  Shortness of breath. EXAM: PORTABLE CHEST 1 VIEW COMPARISON:  October 28, 2020. FINDINGS: Stable cardiomediastinal silhouette. Stable elevated right hemidiaphragm  is noted with increased right basilar atelectasis or infiltrate. No pneumothorax is noted. Mild left basilar atelectasis or infiltrate is noted. Right internal jugular Port-A-Cath is unchanged. Bony thorax is unremarkable. IMPRESSION: Stable elevated right hemidiaphragm with increased right basilar atelectasis or infiltrate. Mild left basilar atelectasis or infiltrate is noted. Electronically Signed   By: Marijo Conception M.D.   On: 11/02/2020 13:19   DG CHEST PORT 1 VIEW  Result Date: 10/28/2020 CLINICAL DATA:  Dyspnea, COVID EXAM: PORTABLE CHEST 1 VIEW COMPARISON:  10/26/2020 chest radiograph. FINDINGS: Right internal jugular Port-A-Cath terminates at the cavoatrial junction. Stable cardiomediastinal silhouette with normal heart size. No pneumothorax. No pleural effusion. Stable prominent elevation of the right hemidiaphragm. Moderate patchy opacities in the mid to lower lungs bilaterally stable. No  pulmonary edema. IMPRESSION: Stable moderate patchy opacities in the mid to lower lungs bilaterally, compatible with COVID-19 pneumonia. Electronically Signed   By: Ilona Sorrel M.D.   On: 10/28/2020 12:24   DG Chest Port 1 View  Result Date: 10/26/2020 CLINICAL DATA:  Ongoing shortness of breath. COVID-19 diagnosed 2 weeks ago EXAM: PORTABLE CHEST 1 VIEW COMPARISON:  Six days ago FINDINGS: Chronic elevation of the right diaphragm. Bilateral peripheral airspace disease with increased involvement in the left lung peripherally. Borderline heart size accentuated by diaphragm elevation. Porta catheter on the right with tip at the SVC. No visible air leak or suspected pulmonary edema. Extensive artifact from EKG leads. IMPRESSION: 1. Multifocal pneumonia with progressive opacity from 6 days ago. 2. Chronic elevation of the right diaphragm. Electronically Signed   By: Monte Fantasia M.D.   On: 10/26/2020 11:04   DG Chest Port 1 View  Result Date: 10/20/2020 CLINICAL DATA:  Recent COVID positive, new shortness of breath EXAM: PORTABLE CHEST 1 VIEW COMPARISON:  None. FINDINGS: Right chest wall port catheter tip overlies SVC. Opacification of the right mid and lower lung likely reflects combination of moderate pleural effusion and atelectasis. Patchy interstitial prominence in the remainder of the right lung as well as the left lung. No pneumothorax. Cardiomediastinal contours are partially obscured but otherwise unremarkable. IMPRESSION: Probable moderate pleural effusion and associated atelectasis. Patchy interstitial prominence in the remainder of the lungs may reflect edema or pneumonia. Electronically Signed   By: Macy Mis M.D.   On: 10/20/2020 14:30   DG Abd Portable 1V  Result Date: 11/02/2020 CLINICAL DATA:  Shortness of breath. EXAM: PORTABLE ABDOMEN - 1 VIEW COMPARISON:  July 24, 2019. FINDINGS: The bowel gas pattern is normal. No radio-opaque calculi or other significant radiographic  abnormality are seen. IMPRESSION: Negative. Electronically Signed   By: Marijo Conception M.D.   On: 11/02/2020 13:20   ECHOCARDIOGRAM LIMITED  Result Date: 11/02/2020    ECHOCARDIOGRAM LIMITED REPORT   Patient Name:   Antonio [name] Date of Exam: 11/02/2020 Medical Rec #:  [id number]      Height:       72.0 in Accession #:    [id number]     Weight:       178.1 lb Date of Birth:  1941/04/28      BSA:          2.028 m Patient Age:    64 years       BP:           98/58 mmHg Patient Gender: M              HR:           97  bpm. Exam Location:  Inpatient Procedure: Limited Echo, Limited Color Doppler and Cardiac Doppler Indications:    Dyspnea R06.00  History:        Patient has no prior history of Echocardiogram examinations.                 Covid-19 Positive; Risk Factors:Hypertension and Dyslipidemia.  Sonographer:    Mikki Santee RDCS (AE) Referring Phys: [id number] [place]  1. Left ventricular ejection fraction, by estimation, is 50%. The left ventricle has low normal function. The left ventricle has no regional wall motion abnormalities. There is mild left ventricular hypertrophy. Left ventricular diastolic parameters are  consistent with Grade I diastolic dysfunction (impaired relaxation). There is abnormal septal motion due to conduction delay.  2. Right ventricular systolic function is mildly reduced. The right ventricular size is normal. Tricuspid regurgitation signal is inadequate for assessing PA pressure.  3. The mitral valve is grossly normal. No evidence of mitral valve regurgitation.  4. The aortic valve is grossly normal. There is mild calcification of the aortic valve. Aortic valve regurgitation is not visualized. No aortic stenosis is present.  5. The inferior vena cava is normal in size with greater than 50% respiratory variability, suggesting right atrial pressure of 3 mmHg. FINDINGS  Left Ventricle: Left ventricular ejection fraction, by estimation, is 50%. The left ventricle  has low normal function. The left ventricle has no regional wall motion abnormalities. The left ventricular internal cavity size was normal in size. There is mild left ventricular hypertrophy. Abnormal septal motion due to conduction delay. Left ventricular diastolic parameters are consistent with Grade I diastolic dysfunction (impaired relaxation). Right Ventricle: The right ventricular size is normal. Right ventricular systolic function is mildly reduced. Tricuspid regurgitation signal is inadequate for assessing PA pressure. Mitral Valve: The mitral valve is grossly normal. Tricuspid Valve: The tricuspid valve is normal in structure. Tricuspid valve regurgitation is trivial. Aortic Valve: The aortic valve is grossly normal. There is mild calcification of the aortic valve. Aortic valve regurgitation is not visualized. No aortic stenosis is present. Aorta: The aortic root is normal in size and structure. Venous: The inferior vena cava is normal in size with greater than 50% respiratory variability, suggesting right atrial pressure of 3 mmHg. LEFT VENTRICLE PLAX 2D LVIDd:         4.70 cm  Diastology LVIDs:         3.20 cm  LV e' medial:    5.77 cm/s LV PW:         1.30 cm  LV E/e' medial:  13.8 LV IVS:        1.30 cm  LV e' lateral:   9.25 cm/s LVOT diam:     2.20 cm  LV E/e' lateral: 8.6 LVOT Area:     3.80 cm  LEFT ATRIUM             Index       RIGHT ATRIUM           Index LA diam:        3.20 cm 1.58 cm/m  RA Area:     11.60 cm LA Vol (A2C):   27.0 ml 13.31 ml/m RA Volume:   20.10 ml  9.91 ml/m LA Vol (A4C):   30.3 ml 14.94 ml/m LA Biplane Vol: 29.6 ml 14.59 ml/m   AORTA Ao Root diam: 3.50 cm MITRAL VALVE MV Area (PHT): 2.80 cm     SHUNTS MV Decel Time: 271 msec  Systemic Diam: 2.20 cm MV E velocity: 79.60 cm/s MV A velocity: 103.00 cm/s MV E/A ratio:  0.77 Cherlynn Kaiser MD Electronically signed by Cherlynn Kaiser MD Signature Date/Time: 11/02/2020/5:59:16 PM    Final       The results of  significant diagnostics from this hospitalization (including imaging, microbiology, ancillary and laboratory) are listed below for reference.     Microbiology: Recent Results (from the past 240 hour(s))  SARS CORONAVIRUS 2 (TAT 6-24 HRS) Nasopharyngeal Nasopharyngeal Swab     Status: Abnormal   Collection Time: 11/09/20  1:03 PM   Specimen: Nasopharyngeal Swab  Result Value Ref Range Status   SARS Coronavirus 2 POSITIVE (A) NEGATIVE Final    Comment: (NOTE) SARS-CoV-2 target nucleic acids are DETECTED.  The SARS-CoV-2 RNA is generally detectable in upper and lower respiratory specimens during the acute phase of infection. Positive results are indicative of the presence of SARS-CoV-2 RNA. Clinical correlation with patient history and other diagnostic information is  necessary to determine patient infection status. Positive results do not rule out bacterial infection or co-infection with other viruses.  The expected result is Negative.  Fact Sheet for Patients: SugarRoll.be  Fact Sheet for Healthcare Providers: https://www.woods-mathews.com/  This test is not yet approved or cleared by the [place] FDA and  has been authorized for detection and/or diagnosis of SARS-CoV-2 by FDA under an Emergency Use Authorization (EUA). This EUA will remain  in effect (meaning this test can be used) for the duration of the COVID-19 declaration under Section 564(b)(1) of the Act, 21 U. S.C. section 360bbb-3(b)(1), unless the authorization is terminated or revoked sooner.   Performed at [facility], [place] [address] [place], [place] [address]      Labs: BNP (last 3 results) No results for input(s): BNP in the last 8760 hours. Basic Metabolic Panel: Recent Labs  Lab 11/06/20 0446 11/07/20 0440 11/08/20 0635 11/09/20 0705 11/10/20 0756  NA 141 142 142 144 142  K 4.2 4.3 4.2 4.4 3.9  CL 96* 96* 99 101 99  CO2 34* 36* 34* 36* 32   GLUCOSE 133* 134* 123* 111* 94  BUN 40* 38* 35* 32* 34*  CREATININE 0.91 0.90 0.90 0.96 0.90  CALCIUM 9.2 9.0 8.9 8.9 8.9  MG  --   --   --   --  2.4   Liver Function Tests: Recent Labs  Lab 11/05/20 0325  AST 17  ALT 42  ALKPHOS 52  BILITOT 0.6  PROT 5.7*  ALBUMIN 2.8*   No results for input(s): LIPASE, AMYLASE in the last 168 hours. No results for input(s): AMMONIA in the last 168 hours. CBC: Recent Labs  Lab 11/05/20 0325 11/08/20 0635  WBC 13.2* 12.8*  NEUTROABS 11.4* 9.4*  HGB 14.1 13.5  HCT 42.0 43.1  MCV 97.9 101.7*  PLT 224 187   Cardiac Enzymes: No results for input(s): CKTOTAL, CKMB, CKMBINDEX, TROPONINI in the last 168 hours. BNP: Invalid input(s): POCBNP CBG: No results for input(s): GLUCAP in the last 168 hours. D-Dimer No results for input(s): DDIMER in the last 72 hours. Hgb A1c No results for input(s): HGBA1C in the last 72 hours. Lipid Profile No results for input(s): CHOL, HDL, LDLCALC, TRIG, CHOLHDL, LDLDIRECT in the last 72 hours. Thyroid function studies No results for input(s): TSH, T4TOTAL, T3FREE, THYROIDAB in the last 72 hours.  Invalid input(s): FREET3 Anemia work up No results for input(s): VITAMINB12, FOLATE, FERRITIN, TIBC, IRON, RETICCTPCT in the last 72 hours. Urinalysis  Component Value Date/Time   COLORURINE YELLOW 10/26/2020 1501   APPEARANCEUR CLEAR 10/26/2020 1501   LABSPEC 1.026 10/26/2020 1501   PHURINE 6.0 10/26/2020 1501   GLUCOSEU NEGATIVE 10/26/2020 1501   HGBUR NEGATIVE 10/26/2020 1501   BILIRUBINUR NEGATIVE 10/26/2020 1501   KETONESUR NEGATIVE 10/26/2020 1501   PROTEINUR NEGATIVE 10/26/2020 1501   NITRITE NEGATIVE 10/26/2020 1501   LEUKOCYTESUR NEGATIVE 10/26/2020 1501   Sepsis Labs Invalid input(s): PROCALCITONIN,  WBC,  LACTICIDVEN Microbiology Recent Results (from the past 240 hour(s))  SARS CORONAVIRUS 2 (TAT 6-24 HRS) Nasopharyngeal Nasopharyngeal Swab     Status: Abnormal   Collection Time:  11/09/20  1:03 PM   Specimen: Nasopharyngeal Swab  Result Value Ref Range Status   SARS Coronavirus 2 POSITIVE (A) NEGATIVE Final    Comment: (NOTE) SARS-CoV-2 target nucleic acids are DETECTED.  The SARS-CoV-2 RNA is generally detectable in upper and lower respiratory specimens during the acute phase of infection. Positive results are indicative of the presence of SARS-CoV-2 RNA. Clinical correlation with patient history and other diagnostic information is  necessary to determine patient infection status. Positive results do not rule out bacterial infection or co-infection with other viruses.  The expected result is Negative.  Fact Sheet for Patients: SugarRoll.be  Fact Sheet for Healthcare Providers: https://www.woods-mathews.com/  This test is not yet approved or cleared by the [place] FDA and  has been authorized for detection and/or diagnosis of SARS-CoV-2 by FDA under an Emergency Use Authorization (EUA). This EUA will remain  in effect (meaning this test can be used) for the duration of the COVID-19 declaration under Section 564(b)(1) of the Act, 21 U. S.C. section 360bbb-3(b)(1), unless the authorization is terminated or revoked sooner.   Performed at [facility], [place] [address] [place], [place] [address]      Time coordinating discharge:  I have spent 35 minutes face to face with the patient and on the ward discussing the patients care, assessment, plan and disposition with other care givers. >50% of the time was devoted counseling the patient about the risks and benefits of treatment/Discharge disposition and coordinating care.   SIGNED:   Damita Lack, MD  [facility] 11/11/2020, 11:18 AM   If 7PM-7AM, please contact night-coverage

## 2020-11-12 ENCOUNTER — Other Ambulatory Visit: Payer: Self-pay | Admitting: Internal Medicine

## 2020-11-12 DIAGNOSIS — M6281 Muscle weakness (generalized): Secondary | ICD-10-CM | POA: Diagnosis not present

## 2020-11-12 DIAGNOSIS — R278 Other lack of coordination: Secondary | ICD-10-CM | POA: Diagnosis not present

## 2020-11-12 DIAGNOSIS — G9341 Metabolic encephalopathy: Secondary | ICD-10-CM | POA: Diagnosis not present

## 2020-11-12 DIAGNOSIS — J9601 Acute respiratory failure with hypoxia: Secondary | ICD-10-CM | POA: Diagnosis not present

## 2020-11-12 DIAGNOSIS — J1281 Pneumonia due to SARS-associated coronavirus: Secondary | ICD-10-CM | POA: Diagnosis not present

## 2020-11-12 DIAGNOSIS — Z8572 Personal history of non-Hodgkin lymphomas: Secondary | ICD-10-CM | POA: Diagnosis not present

## 2020-11-12 DIAGNOSIS — G250 Essential tremor: Secondary | ICD-10-CM

## 2020-11-12 DIAGNOSIS — R2689 Other abnormalities of gait and mobility: Secondary | ICD-10-CM | POA: Diagnosis not present

## 2020-11-13 DIAGNOSIS — Z8572 Personal history of non-Hodgkin lymphomas: Secondary | ICD-10-CM | POA: Diagnosis not present

## 2020-11-13 DIAGNOSIS — R2689 Other abnormalities of gait and mobility: Secondary | ICD-10-CM | POA: Diagnosis not present

## 2020-11-13 DIAGNOSIS — M6281 Muscle weakness (generalized): Secondary | ICD-10-CM | POA: Diagnosis not present

## 2020-11-13 DIAGNOSIS — J9601 Acute respiratory failure with hypoxia: Secondary | ICD-10-CM | POA: Diagnosis not present

## 2020-11-13 DIAGNOSIS — G9341 Metabolic encephalopathy: Secondary | ICD-10-CM | POA: Diagnosis not present

## 2020-11-13 DIAGNOSIS — R278 Other lack of coordination: Secondary | ICD-10-CM | POA: Diagnosis not present

## 2020-11-13 DIAGNOSIS — J1281 Pneumonia due to SARS-associated coronavirus: Secondary | ICD-10-CM | POA: Diagnosis not present

## 2020-11-16 ENCOUNTER — Telehealth: Payer: Self-pay | Admitting: Internal Medicine

## 2020-11-16 DIAGNOSIS — G9341 Metabolic encephalopathy: Secondary | ICD-10-CM | POA: Diagnosis not present

## 2020-11-16 DIAGNOSIS — R2689 Other abnormalities of gait and mobility: Secondary | ICD-10-CM | POA: Diagnosis not present

## 2020-11-16 DIAGNOSIS — R278 Other lack of coordination: Secondary | ICD-10-CM | POA: Diagnosis not present

## 2020-11-16 DIAGNOSIS — J9601 Acute respiratory failure with hypoxia: Secondary | ICD-10-CM | POA: Diagnosis not present

## 2020-11-16 DIAGNOSIS — M6281 Muscle weakness (generalized): Secondary | ICD-10-CM | POA: Diagnosis not present

## 2020-11-16 DIAGNOSIS — J1281 Pneumonia due to SARS-associated coronavirus: Secondary | ICD-10-CM | POA: Diagnosis not present

## 2020-11-16 DIAGNOSIS — Z8572 Personal history of non-Hodgkin lymphomas: Secondary | ICD-10-CM | POA: Diagnosis not present

## 2020-11-16 NOTE — Telephone Encounter (Signed)
He has been on prednisone which ended 1/30.  As not been sleeping.  We discussed that it will still be in his system a while.  I had chosen not to given him "excedrin pm" while in rehab due to his advanced age and side effects.  He was wanting something to help him rest so I ordered melatonin 5mg  qhs prn insomnia.

## 2020-11-17 DIAGNOSIS — R278 Other lack of coordination: Secondary | ICD-10-CM | POA: Diagnosis not present

## 2020-11-17 DIAGNOSIS — U099 Post covid-19 condition, unspecified: Secondary | ICD-10-CM | POA: Diagnosis not present

## 2020-11-17 DIAGNOSIS — R2689 Other abnormalities of gait and mobility: Secondary | ICD-10-CM | POA: Diagnosis not present

## 2020-11-17 DIAGNOSIS — G9341 Metabolic encephalopathy: Secondary | ICD-10-CM | POA: Diagnosis not present

## 2020-11-17 DIAGNOSIS — J9601 Acute respiratory failure with hypoxia: Secondary | ICD-10-CM | POA: Diagnosis not present

## 2020-11-17 DIAGNOSIS — J1281 Pneumonia due to SARS-associated coronavirus: Secondary | ICD-10-CM | POA: Diagnosis not present

## 2020-11-17 DIAGNOSIS — M6281 Muscle weakness (generalized): Secondary | ICD-10-CM | POA: Diagnosis not present

## 2020-11-17 DIAGNOSIS — Z8572 Personal history of non-Hodgkin lymphomas: Secondary | ICD-10-CM | POA: Diagnosis not present

## 2020-11-17 LAB — CBC AND DIFFERENTIAL
HCT: 39 — AB (ref 41–53)
Hemoglobin: 13.5 (ref 13.5–17.5)
Platelets: 152 (ref 150–399)
WBC: 6.4

## 2020-11-17 LAB — BASIC METABOLIC PANEL
BUN: 17 (ref 4–21)
CO2: 29 — AB (ref 13–22)
Chloride: 102 (ref 99–108)
Creatinine: 0.7 (ref 0.6–1.3)
Glucose: 108
Potassium: 4 (ref 3.4–5.3)
Sodium: 141 (ref 137–147)

## 2020-11-17 LAB — CBC: RBC: 4.09 (ref 3.87–5.11)

## 2020-11-17 LAB — COMPREHENSIVE METABOLIC PANEL: Calcium: 8.7 (ref 8.7–10.7)

## 2020-11-18 ENCOUNTER — Other Ambulatory Visit: Payer: Self-pay | Admitting: *Deleted

## 2020-11-18 ENCOUNTER — Encounter: Payer: Self-pay | Admitting: Internal Medicine

## 2020-11-18 ENCOUNTER — Non-Acute Institutional Stay (SKILLED_NURSING_FACILITY): Payer: PPO | Admitting: Internal Medicine

## 2020-11-18 DIAGNOSIS — R278 Other lack of coordination: Secondary | ICD-10-CM | POA: Diagnosis not present

## 2020-11-18 DIAGNOSIS — B372 Candidiasis of skin and nail: Secondary | ICD-10-CM

## 2020-11-18 DIAGNOSIS — J1281 Pneumonia due to SARS-associated coronavirus: Secondary | ICD-10-CM | POA: Diagnosis not present

## 2020-11-18 DIAGNOSIS — U071 COVID-19: Secondary | ICD-10-CM | POA: Diagnosis not present

## 2020-11-18 DIAGNOSIS — C8339 Diffuse large B-cell lymphoma, extranodal and solid organ sites: Secondary | ICD-10-CM

## 2020-11-18 DIAGNOSIS — D849 Immunodeficiency, unspecified: Secondary | ICD-10-CM

## 2020-11-18 DIAGNOSIS — J181 Lobar pneumonia, unspecified organism: Secondary | ICD-10-CM

## 2020-11-18 DIAGNOSIS — R2689 Other abnormalities of gait and mobility: Secondary | ICD-10-CM | POA: Diagnosis not present

## 2020-11-18 DIAGNOSIS — G9341 Metabolic encephalopathy: Secondary | ICD-10-CM | POA: Diagnosis not present

## 2020-11-18 DIAGNOSIS — M6281 Muscle weakness (generalized): Secondary | ICD-10-CM | POA: Diagnosis not present

## 2020-11-18 DIAGNOSIS — I10 Essential (primary) hypertension: Secondary | ICD-10-CM

## 2020-11-18 DIAGNOSIS — J9601 Acute respiratory failure with hypoxia: Secondary | ICD-10-CM | POA: Diagnosis not present

## 2020-11-18 DIAGNOSIS — Z8572 Personal history of non-Hodgkin lymphomas: Secondary | ICD-10-CM | POA: Diagnosis not present

## 2020-11-18 NOTE — Progress Notes (Signed)
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 male who has been referred to [place] by Dr. Hollace [name] for evaluation and management of Bone lesion of left lower leg. The pt reports that he is doing well [name].   The pt reports that he cannot feel the ovoid finding from his 04/09/18 MRI as noted below. He denies being able to feel anything in his skin as well. He notes that he received a needle injection in his knee prior to this MRI. The pt notes that his knee has lost much cartilage and is considering a knee replacement. He denies any recent injuries to his knee or falls. He has contacted Dr. Alphonsa [name] in surgery and another surgeon as well and will be making an appointment.   The pt notes that he does not feel any differently recently as compared to 6 months to a year ago. He does note that he normally has constipation in the spring, and has continued to have mild constipation. He denies any other concerns or symptoms.   The pt previously saw my colleague Dr. Lurline [name] for primary testicular B-cell lymphoma in 2000. The pt notes that the involvement was limited to only one testicle. He received 6 intrathecal treatments in his spine, and received 4 cycles of chemotherapy (thinks this was CHOP) and radiation as well. The pt was followed by an oncologist for several years at the [facility] in [place] after moving from [place] after his [place] treatment.   The pt notes that he was first diagnosed with melanoma in October 2000. He noticed a strange spot on his left leg that was surgically resected. The pt's personal notes report a 2.9cm thick, level 4, no LN involvement, high risk stage  II, treated with Interferon three times a week for one year. He notes some squamous cell and basal cell involvements as well and sees a dermatologist, Dr. Wilhemina [name], regularly.   Of note prior to the patient's visit today, pt has had MRI Left Knee completed on 04/09/18 with results revealing An ovoid, lobulated, 74m T2 hyperintense focus within the medial subcutaneous far 1.994mdistal to the joint demonstrates demonstrates nonspecific imaging characteristics.   Most recent lab results (03/01/18) of CBC w/diff is as follows: all values are WNL except for RBC at 4.54, MCV at 100.4, MCH at 33.8.   On review of systems, pt reports left knee pain, mild constipation, and denies new or concerning symptoms, noticing any new lumps or bumps, pain along the spine, abdominal pains, problems passing urine, testicular pain/swelling, and any other symptoms.   Interval History:   WiDAQUAN CRAPPSeturns today for management and evaluation evaluation of Left Primary Testicular Large B-cell Lymphoma. The patient's last visit with [place] on 08/19/2020. The pt reports that he is doing well [name].  The pt was recently hospitalized with COVID on 10/20/2020. He received Remdesivir. He was hospitalized with acute respiratory failure on 10/26/2020. He [place]. His CT showed viral pnemonia-like COVID. He is currently in a rehabilitation facility and hopes to be discharged in two days.  The pt notes prior to his  infection he received both his vaccines and booster.  The pt reports that he still experiences low oxygen levels. When resting, he is at 94. When moving, he is around 54. He has stopped using oxygen at home six days ago and notes he is trying to build his strength. He has a small tank of oxygen if needed when walking around.  Lab results today 11/19/2020 of CBC w/diff and CMP is as follows: all values are WNL except for RBC of  3.95, Hgb of 12.7, MCV of 101.5, Lymph Abs of 0.6K, Abs Immature Granulocytes of 0.11K, Glucose of 100, Calcium of 8.7, Total Protein of 6.2, Albumin of 3.2. 11/19/2020 LDH of 160.  On review of systems, pt reports SOB, weakness and denies new lumps/bumps, fevers, abdominal pain, back pain, decreased appetite and any other symptoms.  MEDICAL HISTORY:  Past Medical History:  Diagnosis Date  . Age-related macular degeneration, dry, both eyes   . B-cell lymphoma [facility]) oncologist--- dr Antonio [name]   dx 2000 primary right testicular diffuse large b-cell lymphoma;  s/p  right orchiectomy 2000, completed chemo, intrathcal chemo (spine injection's), and radiation to left testis in 2000  . Carotid stenosis, asymptomatic, left ?  . ED (erectile dysfunction)   . Elevated diaphragm    right  . Erectile dysfunction   . History of basal cell carcinoma (BCC) excision    multiple excision in office  . History of kidney stones   . History of seizure    03-03-2020 per pt x1 while taking interferan for melanoma in 2001 ,  none since, told a side effect of interferan  . History of squamous cell carcinoma excision    multiple skin excision in office  . Hyperlipidemia   . Hypertension    followed by pcp  (03-03-2020 per pt had a stress test approx. 2010, told normal)  . LBBB (left bundle branch block)   . Lymphoma of testis ([facility]) 2000   s/p right orchiectomy   . Mass of left testis   . MGUS (monoclonal gammopathy of unknown significance)    oncologist--- dr Antonio [name]  . Mixed hyperlipidemia 10/26/2020  . Personal history of malignant melanoma of skin dermatologist--- dr Antonio [name]   dx 2001 left calf area  s/p WLE ,  completed one year interferan (03-03-2020 pt states did not involve lymph nodes, no recurrence)  . Urgency of urination   . Wears hearing aid in both ears     SURGICAL HISTORY: Past Surgical History:  Procedure Laterality Date  . CATARACT EXTRACTION W/ INTRAOCULAR LENS IMPLANT Bilateral 2018  .  IR IMAGING GUIDED PORT INSERTION  03/31/2020  . MELANOMA EXCISION  10/1999  . ORCHIECTOMY Right 2000  . ORCHIECTOMY Left 03/05/2020   Procedure: ORCHIECTOMY;  Surgeon: [name], MD;  Location: [facility];  Service: Urology;  Laterality: Left;  . TONSILLECTOMY  child  . TOTAL KNEE ARTHROPLASTY Right 06/2009    SOCIAL HISTORY: Social History   Socioeconomic History  . Marital status: Married    Spouse name: Not on file  . Number of children: Not on file  . Years of education: Not on file  . Highest education level: Not on file  Occupational History  . Not on file  Tobacco Use  . Smoking status: Never Smoker  . Smokeless tobacco: Never Used  Vaping Use  . Vaping Use: Never used  Substance and Sexual Activity  . Alcohol use: Yes    Alcohol/week: 14.0 standard drinks    Types:  14 Glasses of wine per week    Comment: 2 wine daily  . Drug use: Never  . Sexual activity: Not on file  Other Topics Concern  . Not on file  Social History Narrative   Social History      Diet?good      Do you drink/eat things with caffeine? seldom      Marital status?          yes                          What year were you married? 1962      Do you live in a house, apartment, assisted living, condo, trailer, etc.? house      Is it one or more stories?1      How many persons live in your home? 2      Do you have any pets in your home? (please list) no      Highest level of education completed? college      Current or past profession: president and [employer]      Do you exercise?   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 yes      Do you have a DNR form?       yes                            If not, do you want to discuss one?      Do you have signed POA/HPOA for forms?  yes      Functional Status      Do you have difficulty bathing or dressing yourself? no      Do  you have difficulty preparing food or eating? no      Do you have difficulty managing your medications? no      Do you have difficulty managing your finances? no      Do you have difficulty affording your medications? no   Social Determinants of Health   Financial Resource Strain: Not on file  Food Insecurity: Not on file  Transportation Needs: Not on file  Physical Activity: Not on file  Stress: Not on file  Social Connections: Not on file  Intimate Partner Violence: Not on file    FAMILY HISTORY: Family History  Problem Relation Age of Onset  . Cataracts Mother   . Transient ischemic attack Mother   . Asthma Mother   . Cataracts Father   . Diabetes Father        borderline  . Hypertension Father   . Hyperlipidemia Father   . Hyperlipidemia Son   . Hypertension Son   . Amblyopia Neg Hx   . Blindness Neg Hx   . Glaucoma Neg Hx   . Macular degeneration Neg Hx   . Retinal detachment Neg Hx   . Strabismus Neg Hx   . Retinitis pigmentosa Neg Hx     ALLERGIES:  is allergic to latex.  MEDICATIONS:  Current Outpatient Medications  Medication Sig Dispense Refill  . albuterol (VENTOLIN HFA) 108 (90 Base) MCG/ACT inhaler Inhale 2 puffs  into the lungs every 6 (six) hours as needed for shortness of breath. 8 g 2  . ascorbic acid (VITAMIN C) 500 MG tablet Take 1 tablet (500 mg total) by mouth daily. 14 tablet 0  . benzonatate (TESSALON) 100 MG capsule Take 1 capsule (100 mg total) by mouth 3 (three) times daily as needed for cough. 60 capsule 0  . chlorpheniramine-HYDROcodone (TUSSIONEX PENNKINETIC ER) 10-8 MG/5ML SUER Take 5 mLs by mouth at bedtime as needed for cough. 115 mL 0  . Cholecalciferol 100 MCG (4000 UT) CAPS Take 1 capsule (4,000 Units total) by mouth daily. 14 capsule 0  . Cyanocobalamin (B-12 PO) Take 1 tablet by mouth daily.    . diphenhydramine-acetaminophen (TYLENOL PM) 25-500 MG TABS tablet Take 1 tablet by mouth at bedtime as needed (sleep).    . famotidine  (PEPCID) 20 MG tablet Take 1 tablet (20 mg total) by mouth daily. 30 tablet 0  . feeding supplement (ENSURE ENLIVE / ENSURE PLUS) LIQD Take 237 mLs by mouth 3 (three) times daily between meals. 237 mL 12  . fluticasone (FLONASE) 50 MCG/ACT nasal spray Place 2 sprays into both nostrils daily. 11.1 mL 0  . guaiFENesin (MUCINEX) 600 MG 12 hr tablet Take 1 tablet (600 mg total) by mouth 2 (two) times daily. 60 tablet 0  . ipratropium-albuterol (DUONEB) 0.5-2.5 (3) MG/3ML SOLN Inhale 3 mLs into the lungs every 6 (six) hours. 3 mL 0  . Ketotifen Fumarate (REFRESH EYE ITCH RELIEF OP) Place 1 drop into both eyes daily as needed (itchy eyes).     Marland Kitchen lactulose (CHRONULAC) 10 GM/15ML solution Take 15 mLs (10 g total) by mouth daily. 236 mL 0  . losartan (COZAAR) 100 MG tablet Take 1 tablet (100 mg total) by mouth daily. 90 tablet 1  . metoprolol tartrate (LOPRESSOR) 25 MG tablet Take 0.5 tablets (12.5 mg total) by mouth 2 (two) times daily. 60 tablet 0  . Multiple Vitamins-Minerals (PRESERVISION AREDS 2 PO) Take 1 capsule by mouth 2 (two) times daily.     . polyethylene glycol (MIRALAX / GLYCOLAX) 17 g packet Take 17 g by mouth daily. 14 each 0  . senna-docusate (SENOKOT-S) 8.6-50 MG tablet Take 1 tablet by mouth 2 (two) times daily. 60 tablet 0  . simvastatin (ZOCOR) 40 MG tablet TAKE 1 TABLET BY MOUTH EVERY DAY (Patient taking differently: Take 40 mg by mouth daily at 6 PM.) 90 tablet 1  . Testosterone 20.25 MG/ACT (1.62%) GEL Apply 40.5 mg topically daily.    . valACYclovir (VALTREX) 1000 MG tablet Take one tablet by mouth twice daily for 3 days as needed for flares. (Patient taking differently: Take 500 mg by mouth See admin instructions. Take one tablet by mouth twice daily for 3 days as needed for flares.) 30 tablet 3  . zinc sulfate 220 (50 Zn) MG capsule Take 1 capsule (220 mg total) by mouth daily. 30 capsule 0   No current facility-administered medications for this visit.    REVIEW OF SYSTEMS:    10 Point review of Systems was done is negative except as noted above.  PHYSICAL EXAMINATION: ECOG PERFORMANCE STATUS: 0 - Asymptomatic VS reviewed - stable  Vitals:   11/19/20 1339  BP: (!) 98/47  Pulse: 93  Resp: 16  Temp: 98.4 F (36.9 C)  SpO2: 93%   Wt Readings from Last 3 Encounters:  11/11/20 175 lb 4.8 oz (79.5 kg)  10/20/20 191 lb 12.8 oz (87 kg)  09/09/20 193 lb 6.4  oz (87.7 kg)   Body mass index is 24.98 kg/m.    Exam was given in a chair.  GENERAL:alert, in no acute distress and comfortable SKIN: no acute rashes, no significant lesions EYES: conjunctiva are pink and non-injected, sclera anicteric OROPHARYNX: MMM, no exudates, no oropharyngeal erythema or ulceration NECK: supple, no JVD LYMPH:  no palpable lymphadenopathy in the cervical, axillary or inguinal regions LUNGS: clear to auscultation b/l with normal respiratory effort HEART: regular rate & rhythm ABDOMEN:  normoactive bowel sounds , non tender, not distended. Extremity: no pedal edema PSYCH: alert & oriented x 3 with fluent speech NEURO: no focal motor/sensory deficits  LABORATORY DATA:  I have reviewed the data as listed  . CBC Latest Ref Rng & Units 11/19/2020 11/17/2020 11/08/2020  WBC 4.0 - 10.5 K/uL 7.8 6.4 12.8(H)  Hemoglobin 13.0 - 17.0 g/dL 12.7(L) 13.5 13.5  Hematocrit 39.0 - 52.0 % 40.1 39(A) 43.1  Platelets 150 - 400 K/uL 170 152 187   . CBC    Component Value Date/Time   WBC 7.8 11/19/2020 1323   WBC 12.8 (H) 11/08/2020 0635   RBC 3.95 (L) 11/19/2020 1323   HGB 12.7 (L) 11/19/2020 1323   HCT 40.1 11/19/2020 1323   HCT 40.8 12/19/2019 1057   PLT 170 11/19/2020 1323   MCV 101.5 (H) 11/19/2020 1323   MCH 32.2 11/19/2020 1323   MCHC 31.7 11/19/2020 1323   RDW 14.8 11/19/2020 1323   LYMPHSABS 0.6 (L) 11/19/2020 1323   MONOABS 0.9 11/19/2020 1323   EOSABS 0.2 11/19/2020 1323   BASOSABS 0.0 11/19/2020 1323    . CMP Latest Ref Rng & Units 11/19/2020 11/17/2020 11/10/2020   Glucose 70 - 99 mg/dL 100(H) - 94  BUN 8 - 23 mg/dL 17 17 34(H)  Creatinine 0.61 - 1.24 mg/dL 0.83 0.7 0.90  Sodium 135 - 145 mmol/L 141 141 142  Potassium 3.5 - 5.1 mmol/L 3.9 4.0 3.9  Chloride 98 - 111 mmol/L 104 102 99  CO2 22 - 32 mmol/L 29 29(A) 32  Calcium 8.9 - 10.3 mg/dL 8.7(L) 8.7 8.9  Total Protein 6.5 - 8.1 g/dL 6.2(L) - -  Total Bilirubin 0.3 - 1.2 mg/dL 0.6 - -  Alkaline Phos 38 - 126 U/L 54 - -  AST 15 - 41 U/L 16 - -  ALT 0 - 44 U/L 28 - -   03/05/2020 Left Testicular Flow Pathology Report [id number]):    03/05/2020 FISH Panel:    03/05/2020 Left Testicular Mass Surgical Pathology ([id number]):    Component     Latest Ref Rng & Units 12/19/2019  Folate, Hemolysate     Not Estab. ng/mL 390.0  HCT     37.5 - 51.0 % 40.8  Folate, RBC     >498 ng/mL 956  Vitamin B12     180 - 914 pg/mL 882   03/01/18 CBC w/diff:   RADIOGRAPHIC STUDIES: I have personally reviewed the radiological images as listed and agreed with the findings in the report. CT CHEST WO CONTRAST  Result Date: 10/20/2020 CLINICAL DATA:  80 year old male with abnormal x-ray and pleural effusion. EXAM: CT CHEST WITHOUT CONTRAST TECHNIQUE: Multidetector CT imaging of the chest was performed following the standard protocol without IV contrast. COMPARISON:  Chest radiograph dated 10/20/2020. FINDINGS: Evaluation of this exam is limited in the absence of intravenous contrast. Cardiovascular: There is no cardiomegaly or pericardial effusion. There is coronary vascular calcification of the LAD. Mild atherosclerotic calcification of the thoracic aorta. No aneurysmal  dilatation. The central pulmonary arteries are grossly unremarkable. Mediastinum/Nodes: There is no hilar or mediastinal adenopathy. The esophagus is grossly unremarkable. No mediastinal fluid collection. Right-sided Port-A-Cath with tip in central SVC close to the cavoatrial junction. Lungs/Pleura: There is eventration of the right  hemidiaphragm. There are bibasilar patchy ground-glass densities, right greater than left as well as additional scattered ground-glass pulmonary opacities in the upper lobes most consistent with multifocal pneumonia, likely viral or atypical in etiology including COVID-19. Clinical correlation is recommended. There is no pleural effusion pneumothorax. The central airways are patent. Upper Abdomen: A 3 cm right renal upper pole cyst. Musculoskeletal: No chest wall mass or suspicious bone lesions identified. IMPRESSION: Multifocal pneumonia, likely viral or atypical in etiology. Clinical correlation and follow-up to resolution recommended. Electronically Signed   By: Anner Crete M.D.   On: 10/20/2020 19:35   CT Angio Chest PE W and/or Wo Contrast  Result Date: 10/26/2020 CLINICAL DATA:  Hypoxia, shortness of breath EXAM: CT ANGIOGRAPHY CHEST WITH CONTRAST TECHNIQUE: Multidetector CT imaging of the chest was performed using the standard protocol during bolus administration of intravenous contrast. Multiplanar CT image reconstructions and MIPs were obtained to evaluate the vascular anatomy. CONTRAST:  43m OMNIPAQUE IOHEXOL 350 MG/ML SOLN COMPARISON:  10/20/2020 FINDINGS: Cardiovascular: Satisfactory opacification of the pulmonary arteries to the segmental level. No evidence of pulmonary embolism. Normal heart size. No pericardial effusion. Mediastinum/Nodes: No enlarged mediastinal, hilar, or axillary lymph nodes. Thyroid gland, trachea, and esophagus demonstrate no significant findings. Lungs/Pleura: No pleural effusion or pneumothorax. Patchy ground-glass airspace disease in the upper lobe in a peripheral distribution bilaterally, right greater than left. Patchy right lower lobe airspace disease with associated right lower lobe atelectasis. Patchy ground-glass opacities in the right middle lobe and left lower lobe. Upper Abdomen: No acute abnormality. Musculoskeletal: No acute osseous abnormality. No  aggressive osseous lesion. Review of the MIP images confirms the above findings. IMPRESSION: 1. No evidence of pulmonary embolus. 2. Bilateral airspace disease with a predominantly peripheral distribution. Findings are concerning for multilobar pneumonia including atypical viral pneumonia. Electronically Signed   By: HKathreen Devoid  On: 10/26/2020 14:52   DG CHEST PORT 1 VIEW  Result Date: 11/02/2020 CLINICAL DATA:  Shortness of breath. EXAM: PORTABLE CHEST 1 VIEW COMPARISON:  October 28, 2020. FINDINGS: Stable cardiomediastinal silhouette. Stable elevated right hemidiaphragm is noted with increased right basilar atelectasis or infiltrate. No pneumothorax is noted. Mild left basilar atelectasis or infiltrate is noted. Right internal jugular Port-A-Cath is unchanged. Bony thorax is unremarkable. IMPRESSION: Stable elevated right hemidiaphragm with increased right basilar atelectasis or infiltrate. Mild left basilar atelectasis or infiltrate is noted. Electronically Signed   By: JMarijo ConceptionM.D.   On: 11/02/2020 13:19   DG CHEST PORT 1 VIEW  Result Date: 10/28/2020 CLINICAL DATA:  Dyspnea, COVID EXAM: PORTABLE CHEST 1 VIEW COMPARISON:  10/26/2020 chest radiograph. FINDINGS: Right internal jugular Port-A-Cath terminates at the cavoatrial junction. Stable cardiomediastinal silhouette with normal heart size. No pneumothorax. No pleural effusion. Stable prominent elevation of the right hemidiaphragm. Moderate patchy opacities in the mid to lower lungs bilaterally stable. No pulmonary edema. IMPRESSION: Stable moderate patchy opacities in the mid to lower lungs bilaterally, compatible with COVID-19 pneumonia. Electronically Signed   By: JIlona SorrelM.D.   On: 10/28/2020 12:24   DG Chest Port 1 View  Result Date: 10/26/2020 CLINICAL DATA:  Ongoing shortness of breath. COVID-19 diagnosed 2 weeks ago EXAM: PORTABLE CHEST 1 VIEW COMPARISON:  Six days ago  FINDINGS: Chronic elevation of the right diaphragm.  Bilateral peripheral airspace disease with increased involvement in the left lung peripherally. Borderline heart size accentuated by diaphragm elevation. Porta catheter on the right with tip at the SVC. No visible air leak or suspected pulmonary edema. Extensive artifact from EKG leads. IMPRESSION: 1. Multifocal pneumonia with progressive opacity from 6 days ago. 2. Chronic elevation of the right diaphragm. Electronically Signed   By: Monte Fantasia M.D.   On: 10/26/2020 11:04   DG Chest Port 1 View  Result Date: 10/20/2020 CLINICAL DATA:  Recent COVID positive, new shortness of breath EXAM: PORTABLE CHEST 1 VIEW COMPARISON:  None. FINDINGS: Right chest wall port catheter tip overlies SVC. Opacification of the right mid and lower lung likely reflects combination of moderate pleural effusion and atelectasis. Patchy interstitial prominence in the remainder of the right lung as well as the left lung. No pneumothorax. Cardiomediastinal contours are partially obscured but otherwise unremarkable. IMPRESSION: Probable moderate pleural effusion and associated atelectasis. Patchy interstitial prominence in the remainder of the lungs may reflect edema or pneumonia. Electronically Signed   By: Macy Mis M.D.   On: 10/20/2020 14:30   DG Abd Portable 1V  Result Date: 11/02/2020 CLINICAL DATA:  Shortness of breath. EXAM: PORTABLE ABDOMEN - 1 VIEW COMPARISON:  July 24, 2019. FINDINGS: The bowel gas pattern is normal. No radio-opaque calculi or other significant radiographic abnormality are seen. IMPRESSION: Negative. Electronically Signed   By: Marijo Conception M.D.   On: 11/02/2020 13:20   ECHOCARDIOGRAM LIMITED  Result Date: 11/02/2020    ECHOCARDIOGRAM LIMITED REPORT   Patient Name:   Antonio [name] Date of Exam: 11/02/2020 Medical Rec #:  [id number]      Height:       72.0 in Accession #:    [id number]     Weight:       178.1 lb Date of Birth:  July 26, 1941      BSA:          2.028 m Patient Age:    58 years        BP:           98/58 mmHg Patient Gender: M              HR:           97 bpm. Exam Location:  Inpatient Procedure: Limited Echo, Limited Color Doppler and Cardiac Doppler Indications:    Dyspnea R06.00  History:        Patient has no prior history of Echocardiogram examinations.                 Covid-19 Positive; Risk Factors:Hypertension and Dyslipidemia.  Sonographer:    Mikki Santee RDCS (AE) Referring Phys: [id number] [place]  1. Left ventricular ejection fraction, by estimation, is 50%. The left ventricle has low normal function. The left ventricle has no regional wall motion abnormalities. There is mild left ventricular hypertrophy. Left ventricular diastolic parameters are  consistent with Grade I diastolic dysfunction (impaired relaxation). There is abnormal septal motion due to conduction delay.  2. Right ventricular systolic function is mildly reduced. The right ventricular size is normal. Tricuspid regurgitation signal is inadequate for assessing PA pressure.  3. The mitral valve is grossly normal. No evidence of mitral valve regurgitation.  4. The aortic valve is grossly normal. There is mild calcification of the aortic valve. Aortic valve regurgitation is not visualized. No aortic stenosis is present.  5. The  inferior vena cava is normal in size with greater than 50% respiratory variability, suggesting right atrial pressure of 3 mmHg. FINDINGS  Left Ventricle: Left ventricular ejection fraction, by estimation, is 50%. The left ventricle has low normal function. The left ventricle has no regional wall motion abnormalities. The left ventricular internal cavity size was normal in size. There is mild left ventricular hypertrophy. Abnormal septal motion due to conduction delay. Left ventricular diastolic parameters are consistent with Grade I diastolic dysfunction (impaired relaxation). Right Ventricle: The right ventricular size is normal. Right ventricular systolic function is mildly  reduced. Tricuspid regurgitation signal is inadequate for assessing PA pressure. Mitral Valve: The mitral valve is grossly normal. Tricuspid Valve: The tricuspid valve is normal in structure. Tricuspid valve regurgitation is trivial. Aortic Valve: The aortic valve is grossly normal. There is mild calcification of the aortic valve. Aortic valve regurgitation is not visualized. No aortic stenosis is present. Aorta: The aortic root is normal in size and structure. Venous: The inferior vena cava is normal in size with greater than 50% respiratory variability, suggesting right atrial pressure of 3 mmHg. LEFT VENTRICLE PLAX 2D LVIDd:         4.70 cm  Diastology LVIDs:         3.20 cm  LV e' medial:    5.77 cm/s LV PW:         1.30 cm  LV E/e' medial:  13.8 LV IVS:        1.30 cm  LV e' lateral:   9.25 cm/s LVOT diam:     2.20 cm  LV E/e' lateral: 8.6 LVOT Area:     3.80 cm  LEFT ATRIUM             Index       RIGHT ATRIUM           Index LA diam:        3.20 cm 1.58 cm/m  RA Area:     11.60 cm LA Vol (A2C):   27.0 ml 13.31 ml/m RA Volume:   20.10 ml  9.91 ml/m LA Vol (A4C):   30.3 ml 14.94 ml/m LA Biplane Vol: 29.6 ml 14.59 ml/m   AORTA Ao Root diam: 3.50 cm MITRAL VALVE MV Area (PHT): 2.80 cm     SHUNTS MV Decel Time: 271 msec     Systemic Diam: 2.20 cm MV E velocity: 79.60 cm/s MV A velocity: 103.00 cm/s MV E/A ratio:  0.77 Cherlynn Kaiser MD Electronically signed by Cherlynn Kaiser MD Signature Date/Time: 11/02/2020/5:59:16 PM    Final     03/05/20 of Surgical Pathology ([id number]) SURGICAL PATHOLOGY  CASE: [id number]  PATIENT: Antonio [name]  Surgical Pathology Report      Clinical History: Left testicular mass (jmc)      FINAL MICROSCOPIC DIAGNOSIS:   A. TESTICLE AND CORD, LEFT, ORCHIECTOMY:  - Diffuse large B-cell lymphoma  -See comment    COMMENT:   The sections of the testis and spermatic cord nodules show effacement of  the architecture by an atypical lymphoid infiltrate  characterized by  predominance of medium and large lymphoid cells with vesicular chromatin  and small nucleoli associated with brisk mitosis. The appearance is  primarily diffuse with lack of atypical follicles. Variable number of  admixed smaller lymphocytes are seen. To further evaluate this process,  flow cytometric analysis was performed ([id number]) and shows a  monoclonal kappa-restricted B-cell population. In addition, a battery  of immunohistochemical stains was performed and shows that  the atypical  lymphoid cells are positive for CD20, CD79a, PAX 5, CD10 (weak), BCL-2,  MUM-1 and cytoplasmic kappa. No significant staining is seen with  CD138, cyclin D1, CD30, CD34, TdT, EBV in situ hybridization or  cytoplasmic lambda. Ki67 shows variable expression ranging from 10% to  >50%. There is an admixed T-cell population to a lesser extent as seen  with CD3 and CD5 and there is no apparent co-expression of CD5 in B-cell  areas. The [name] features are consistent with involvement by diffuse  large B-cell lymphoma, GCB type. The results were discussed with Dr.  Jeffie Pollock and Antonio [name] on 03/11/2020.    ASSESSMENT & PLAN:   80 y.o. male with  1. Ovoid lesion near the medial aspect of the left knee ? Injection granuloma vs other etiology for lesion. however given previous h/o melanoma and lymphoma will need to be cautious and try to wotrk this up.  04/09/18 MRI of Left Knee revealed An ovoid, lobulated, 43m T2 hyperintense focus within the medial subcutaneous far 1.987mdistal to the joint demonstrates demonstrates nonspecific imaging characteristics.   05/17/18 US[place]oreauided Soft Tissue Biopsy of the Left Knee did not identify any malignant cells and revealed inflammatory cells.  2. MGUS- IgM kappa monoclonal paraproteinemia 05/02/18 MMP revealed M Protein at 0.5 with immunofixation revealing IgM with kappa light chains  3/ h/o Melanoma   4. 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 Appears to be a new event since it is occurring 20 yrs after his previous rt testicular lymphoma S/p 6 cycles of R-CEOP -- currently in remission.  PLAN: -Discussed pt labwork today, 11/19/2020; blood counts stable and LDH normal. Not anemic with Hgb of 12.7. -No lab or clinical evidence of Large B-cell Lymphoma recurrence at this time.  -Advised pt Lymphoma is not likely to be cause of his recent symptoms.  -Recommend pt connect with Pulmonary specialist for post covid mx -Recommend pt continue to rehab and strengthen himself and his lungs. -Advised pt to continue to optimize lung function as he improves. -Recommended that the pt continue to eat well, drink at least 48-64 oz of water each day, and walk 20-30 minutes each day.  -Will see back in 3 months with labs.   FOLLOW UP: RTC with Dr KaIrene Limboith labs in 3 months   The total time spent in the appointment was 20 minutes and more than 50% was on counseling and direct patient cares.  All of the patient's questions were answered with apparent satisfaction. The patient knows to call the clinic with any problems, questions or concerns.    GaSullivan LoneD MSSpencerAHIVMS SCWeimar Medical CenterTOutpatient Surgery Center Of Bocaematology/Oncology Physician CoKeokuk County Health Center(Office):       [phone] cell):  [phone]):           [phone]/11/2020 11:44 AM  I, RoReinaldo Raddleam acting as scribe for Dr. GaSullivan LoneMD.     .I have reviewed the above documentation for accuracy and completeness, and I agree with the above. .GBrunetta GeneraD

## 2020-11-18 NOTE — Progress Notes (Signed)
Provider:  Rexene Edison. Mariea Clonts, D.O., C.M.D. Location:  [facility] Room Number: 151 Place of Service:  SNF (31)  PCP: [name], DO Patient Care Team: [name], DO as PCP - General (Geriatric Medicine)  Extended Emergency Contact Information Primary Emergency Contact: [place] Mobile Phone: [phone] Relation: Daughter Secondary Emergency Contact: Kargbo,[name] Address: [address] [address] [name] [place] Phone: [phone] Mobile Phone: [phone] Relation: Spouse  Code Status: DNR Goals of Care: Advanced Directive information Advanced Directives 11/20/2020  Does Patient Have a Medical Advance Directive? Yes  Type of Advance Directive Out of facility DNR (pink MOST or yellow form)  Does patient want to make changes to medical advance directive? No - Patient declined  Copy of [place] in Chart? -  Pre-existing out of facility DNR order (yellow form or pink MOST form) Yellow form placed in chart (order not valid for inpatient use);Pink MOST form placed in chart (order not valid for inpatient use)   Chief Complaint  Patient presents with  . New Admit To SNF    New Admit to [facility]     HPI: Patient is a 80 y.o. male seen today for admission to [facility] rehab s/p hospitalization at [facility] for covid with acute respiratory failure with hypoxia 1/10-1/26.  He'd had a group of 23 at the [employer] prior to getting Covid and suspects that's where he got it.  I'd done a virtual visit with him in the midst of this so refer to that note also.  He has completed his prednisone taper and had been started on metoprolol 12.5mg  bid for SVT he had while on the prednisone and nebulizers.  Since he's been here, his HR has been elevated especially after the nebs and after he had the metoprolol held for soft bp.  Parameters were added yesterday to hold if SBP<100.    When  seen, he noted feeling much better.  His wife had concerns about his memory, but it turned out he seemed to be having a period of amnesia around his covid illness, but his memory in reference to events since return from the hospital has been normal.  He's been up walking around the unit and has been able to maintain his sats over 88 even when ambulating (decreased to 88% when he went around the unit 2x and he was a bit winded).  His appetite is still less, but he is not used to eating 3 full meals per day--normally eats smaller portions.  Encouraged protein intake.  He also reviewed his code status and confirms DNR code status is correct and what he wants on file.  He c/o rash in his groin that he's had before and wants nystatin cream for it--he did not want me to look at this.  There are plans for him to return home next week if all goes well.   Past Medical History:  Diagnosis Date  . Age-related macular degeneration, dry, both eyes   . B-cell lymphoma [facility]) oncologist--- dr Irene Limbo   dx 2000 primary right testicular diffuse large b-cell lymphoma;  s/p  right orchiectomy 2000, completed chemo, intrathcal chemo (spine injection's), and radiation to left testis in 2000  . Carotid stenosis, asymptomatic, left ?  . ED (erectile dysfunction)   . Elevated diaphragm    right  . Erectile dysfunction   . History of basal cell carcinoma (BCC) excision  multiple excision in office  . History of kidney stones   . History of seizure    03-03-2020 per pt x1 while taking interferan for melanoma in 2001 ,  none since, told a side effect of interferan  . History of squamous cell carcinoma excision    multiple skin excision in office  . Hyperlipidemia   . Hypertension    followed by pcp  (03-03-2020 per pt had a stress test approx. 2010, told normal)  . LBBB (left bundle branch block)   . Lymphoma of testis ([place]) 2000   s/p right orchiectomy   . Mass of left testis   . MGUS (monoclonal gammopathy of  unknown significance)    oncologist--- dr Irene Limbo  . Mixed hyperlipidemia 10/26/2020  . Personal history of malignant melanoma of skin dermatologist--- dr Wilhemina Bonito   dx 2001 left calf area  s/p WLE ,  completed one year interferan (03-03-2020 pt states did not involve lymph nodes, no recurrence)  . Urgency of urination   . Wears hearing aid in both ears    Past Surgical History:  Procedure Laterality Date  . CATARACT EXTRACTION W/ INTRAOCULAR LENS IMPLANT Bilateral 2018  . IR IMAGING GUIDED PORT INSERTION  03/31/2020  . MELANOMA EXCISION  10/1999  . ORCHIECTOMY Right 2000  . ORCHIECTOMY Left 03/05/2020   Procedure: ORCHIECTOMY;  Surgeon: [name], MD;  [facility]: [facility];  Service: Urology;  Laterality: Left;  . TONSILLECTOMY  child  . TOTAL KNEE ARTHROPLASTY Right 06/2009    Social History   Socioeconomic History  . Marital status: Married    Spouse name: Not on file  . Number of children: Not on file  . Years of education: Not on file  . Highest education level: Not on file  Occupational History  . Not on file  Tobacco Use  . Smoking status: Never Smoker  . Smokeless tobacco: Never Used  Vaping Use  . Vaping Use: Never used  Substance and Sexual Activity  . Alcohol use: Yes    Alcohol/week: 14.0 standard drinks    Types: 14 Glasses of wine per week    Comment: 2 wine daily  . Drug use: Never  . Sexual activity: Not on file  Other Topics Concern  . Not on file  Social History Narrative   Social History      Diet?good      Do you drink/eat things with caffeine? seldom      Marital status?          yes                          What year were you married? 1962      Do you live in a house, apartment, assisted living, condo, trailer, etc.? house      Is it one or more stories?1      How many persons live in your home? 2      Do you have any pets in your home? (please list) no      Highest level of education completed? college      Current or  past profession: president and [employer]      Do you exercise?   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 yes      Do you have a DNR form?       yes                            If not, do you want to discuss one?      Do you have signed POA/HPOA for forms?  yes      Functional Status      Do you have difficulty bathing or dressing yourself? no      Do you have difficulty preparing food or eating? no      Do you have difficulty managing your medications? no      Do you have difficulty managing your finances? no      Do you have difficulty affording your medications? no   Social Determinants of Health   Financial Resource Strain: Not on file  Food Insecurity: Not on file  Transportation Needs: Not on file  Physical Activity: Not on file  Stress: Not on file  Social Connections: Not on file    reports that he has never smoked. He has never used smokeless tobacco. He reports current alcohol use of about 14.0 standard drinks of alcohol per week. He reports that he does not use drugs.  Functional Status Survey:    Family History  Problem Relation Age of Onset  . Cataracts Mother   . Transient ischemic attack Mother   . Asthma Mother   . Cataracts Father   . Diabetes Father        borderline  . Hypertension Father   . Hyperlipidemia Father   . Hyperlipidemia Son   . Hypertension Son   . Amblyopia Neg Hx   . Blindness Neg Hx   . Glaucoma Neg Hx   . Macular degeneration Neg Hx   . Retinal detachment Neg Hx   . Strabismus Neg Hx   . Retinitis pigmentosa Neg Hx     Health Maintenance  Topic Date Due  . COVID-19 Vaccine (4 - Booster for Moderna series) 12/12/2020  . TETANUS/TDAP  03/04/2024  . INFLUENZA VACCINE  Completed  . PNA vac Low Risk Adult  Completed    Allergies  Allergen Reactions  . Latex Itching    Latex tape causes itching and redness.     Outpatient Encounter Medications as of 11/18/2020  Medication Sig  . albuterol (VENTOLIN HFA) 108 (90 Base) MCG/ACT inhaler Inhale 2 puffs into the lungs every 6 (six) hours as needed for shortness of breath.  . benzonatate (TESSALON) 100 MG capsule Take 1 capsule (100 mg total) by mouth 3 (three) times daily as needed for cough.  . Cholecalciferol 100 MCG (4000 UT) CAPS Take 1 capsule (4,000 Units total) by mouth daily.  . fluticasone (FLONASE) 50 MCG/ACT nasal spray Place 2 sprays into both nostrils daily.  Marland Kitchen Ketotifen Fumarate (REFRESH EYE ITCH RELIEF OP) Place 1 drop into both eyes daily as needed (itchy eyes).   Marland Kitchen lactulose (CHRONULAC) 10 GM/15ML solution Take 15 mLs (10 g total) by mouth daily.  Marland Kitchen losartan (COZAAR) 100 MG tablet Take 1 tablet (100 mg total) by mouth daily.  . melatonin 5 MG TABS Take 5 mg by mouth.  . Multiple Vitamins-Minerals (PRESERVISION AREDS 2 PO) Take 1 capsule by mouth 2 (two) times daily.   . polyethylene glycol (MIRALAX / GLYCOLAX) 17 g  packet Take 17 g by mouth daily.  Marland Kitchen senna-docusate (SENOKOT-S) 8.6-50 MG tablet Take 1 tablet by mouth 2 (two) times daily.  . simvastatin (ZOCOR) 40 MG tablet TAKE 1 TABLET BY MOUTH EVERY DAY  . Testosterone 20.25 MG/ACT (1.62%) GEL Apply 40.5 mg topically daily.  . valACYclovir (VALTREX) 1000 MG tablet Take 1,000 mg by mouth. Take 1000 (1 grams) PO 2 times daily for 3 days as needed for flares (herpes)  . [DISCONTINUED] ascorbic acid (VITAMIN C) 500 MG tablet Take 1 tablet (500 mg total) by mouth daily.  . [DISCONTINUED] Cyanocobalamin (B-12 PO) Take 1 tablet by mouth daily.  . [DISCONTINUED] famotidine (PEPCID) 20 MG tablet Take 1 tablet (20 mg total) by mouth daily.  . [DISCONTINUED] feeding supplement (ENSURE ENLIVE / ENSURE PLUS) LIQD Take 237 mLs by mouth 3 (three) times daily between meals.  . [DISCONTINUED] guaiFENesin (MUCINEX) 600 MG 12 hr tablet Take 1 tablet (600 mg total) by mouth 2 (two) times daily.  .  [DISCONTINUED] ipratropium-albuterol (DUONEB) 0.5-2.5 (3) MG/3ML SOLN Inhale 3 mLs into the lungs every 6 (six) hours.  . [DISCONTINUED] metoprolol tartrate (LOPRESSOR) 25 MG tablet Take 0.5 tablets (12.5 mg total) by mouth 2 (two) times daily.  . [DISCONTINUED] zinc sulfate 220 (50 Zn) MG capsule Take 1 capsule (220 mg total) by mouth daily.  . [DISCONTINUED] chlorpheniramine-HYDROcodone (TUSSIONEX PENNKINETIC ER) 10-8 MG/5ML SUER Take 5 mLs by mouth at bedtime as needed for cough.  . [DISCONTINUED] diphenhydramine-acetaminophen (TYLENOL PM) 25-500 MG TABS tablet Take 1 tablet by mouth at bedtime as needed (sleep).  . [DISCONTINUED] valACYclovir (VALTREX) 1000 MG tablet Take one tablet by mouth twice daily for 3 days as needed for flares. (Patient taking differently: Take one tablet by mouth twice daily for 3 days as needed for flares.)   No facility-administered encounter medications on file as of 11/18/2020.    Review of Systems  Constitutional: Positive for weight loss. Negative for chills, fever and malaise/fatigue.  HENT: Negative for congestion and sore throat.   Eyes:       Glasses for reading  Respiratory: Positive for shortness of breath. Negative for cough, sputum production and wheezing.   Cardiovascular: Negative for chest pain, palpitations and leg swelling.  Gastrointestinal: Negative for abdominal pain, blood in stool, constipation and melena.  Genitourinary: Negative for dysuria.  Musculoskeletal: Negative for falls and joint pain.  Skin: Negative for rash.  Neurological: Negative for dizziness and loss of consciousness.  Endo/Heme/Allergies: Bruises/bleeds easily.  Psychiatric/Behavioral: Negative for depression and memory loss. The patient is not nervous/anxious and does not have insomnia.        Amnesia re: much of covid illness    Vitals:   11/18/20 1327  BP: 109/73  Pulse: 90  Temp: 97.7 F (36.5 C)  Weight: 180 lb (81.6 kg)  Height: 6' (1.829 m)   Body mass  index is 24.41 kg/m. Physical Exam Vitals reviewed.  Constitutional:      General: He is not in acute distress.    Appearance: Normal appearance. He is not toxic-appearing.  HENT:     Head: Normocephalic and atraumatic.     Right Ear: External ear normal.     Left Ear: External ear normal.     Nose: No congestion.     Mouth/Throat:     Pharynx: Oropharynx is clear.  Eyes:     Extraocular Movements: Extraocular movements intact.     Conjunctiva/sclera: Conjunctivae normal.     Pupils: Pupils are equal, round,  and reactive to light.  Cardiovascular:     Rate and Rhythm: Normal rate and regular rhythm.     Pulses: Normal pulses.     Heart sounds: Normal heart sounds.  Pulmonary:     Effort: Pulmonary effort is normal.     Breath sounds: Normal breath sounds. No wheezing, rhonchi or rales.     Comments: Has not used oxygen recently, but bringing with him if needed for haircut Abdominal:     General: Bowel sounds are normal. There is no distension.     Palpations: Abdomen is soft.     Tenderness: There is no abdominal tenderness.  Musculoskeletal:        General: Normal range of motion.     Cervical back: Neck supple.     Right lower leg: No edema.     Left lower leg: No edema.  Lymphadenopathy:     Cervical: No cervical adenopathy.  Skin:    Comments: Reports erythematous excoriated rash in groin; staff note he's had a vesicular breakout already treated, too  Neurological:     General: No focal deficit present.     Mental Status: He is alert and oriented to person, place, and time.     Cranial Nerves: No cranial nerve deficit.     Motor: No weakness.     Gait: Gait normal.  Psychiatric:        Mood and Affect: Mood normal.        Behavior: Behavior normal.     Labs reviewed: Basic Metabolic Panel: Recent Labs    10/30/20 0150 10/31/20 0302 11/01/20 0649 11/09/20 0705 11/10/20 0756 11/17/20 0000 11/19/20 1323  NA 134* 135   < > 144 142 141 141  K 4.7 4.2   <  > 4.4 3.9 4.0 3.9  CL 98 95*   < > 101 99 102 104  CO2 26 30   < > 36* 32 29* 29  GLUCOSE 172* 131*   < > 111* 94  --  100*  BUN 19 24*   < > 32* 34* 17 17  CREATININE 0.77 0.84   < > 0.96 0.90 0.7 0.83  CALCIUM 8.5* 8.7*   < > 8.9 8.9 8.7 8.7*  MG 2.2 2.1  --   --  2.4  --   --    < > = values in this interval not displayed.   Liver Function Tests: Recent Labs    11/04/20 0510 11/05/20 0325 11/19/20 1323  AST 17 17 16   ALT 43 42 28  ALKPHOS 43 52 54  BILITOT 0.8 0.6 0.6  PROT 5.3* 5.7* 6.2*  ALBUMIN 2.5* 2.8* 3.2*   No results for input(s): LIPASE, AMYLASE in the last 8760 hours. No results for input(s): AMMONIA in the last 8760 hours. CBC: Recent Labs    11/05/20 0325 11/08/20 0635 11/17/20 0000 11/19/20 1323  WBC 13.2* 12.8* 6.4 7.8  NEUTROABS 11.4* 9.4*  --  6.0  HGB 14.1 13.5 13.5 12.7*  HCT 42.0 43.1 39* 40.1  MCV 97.9 101.7*  --  101.5*  PLT 224 187 152 170   Cardiac Enzymes: No results for input(s): CKTOTAL, CKMB, CKMBINDEX, TROPONINI in the last 8760 hours. BNP: Invalid input(s): POCBNP Lab Results  Component Value Date   HGBA1C 5.7 02/27/2020   Lab Results  Component Value Date   TSH 1.569 11/12/2018   Lab Results  Component Value Date   [id number] 735 12/19/2019   No results found for: FOLATE Lab  Results  Component Value Date   FERRITIN 1,251 (H) 10/27/2020    Imaging and Procedures obtained prior to SNF admission: CT Angio Chest PE W and/or Wo Contrast  Result Date: 10/26/2020 CLINICAL DATA:  Hypoxia, shortness of breath EXAM: CT ANGIOGRAPHY CHEST WITH CONTRAST TECHNIQUE: Multidetector CT imaging of the chest was performed using the standard protocol during bolus administration of intravenous contrast. Multiplanar CT image reconstructions and MIPs were obtained to evaluate the vascular anatomy. CONTRAST:  49mL OMNIPAQUE IOHEXOL 350 MG/ML SOLN COMPARISON:  10/20/2020 FINDINGS: Cardiovascular: Satisfactory opacification of the pulmonary  arteries to the segmental level. No evidence of pulmonary embolism. Normal heart size. No pericardial effusion. Mediastinum/Nodes: No enlarged mediastinal, hilar, or axillary lymph nodes. Thyroid gland, trachea, and esophagus demonstrate no significant findings. Lungs/Pleura: No pleural effusion or pneumothorax. Patchy ground-glass airspace disease in the upper lobe in a peripheral distribution bilaterally, right greater than left. Patchy right lower lobe airspace disease with associated right lower lobe atelectasis. Patchy ground-glass opacities in the right middle lobe and left lower lobe. Upper Abdomen: No acute abnormality. Musculoskeletal: No acute osseous abnormality. No aggressive osseous lesion. Review of the MIP images confirms the above findings. IMPRESSION: 1. No evidence of pulmonary embolus. 2. Bilateral airspace disease with a predominantly peripheral distribution. Findings are concerning for multilobar pneumonia including atypical viral pneumonia. Electronically Signed   By: Kathreen Devoid   On: 10/26/2020 14:52   DG Chest Port 1 View  Result Date: 10/26/2020 CLINICAL DATA:  Ongoing shortness of breath. COVID-19 diagnosed 2 weeks ago EXAM: PORTABLE CHEST 1 VIEW COMPARISON:  Six days ago FINDINGS: Chronic elevation of the right diaphragm. Bilateral peripheral airspace disease with increased involvement in the left lung peripherally. Borderline heart size accentuated by diaphragm elevation. Porta catheter on the right with tip at the SVC. No visible air leak or suspected pulmonary edema. Extensive artifact from EKG leads. IMPRESSION: 1. Multifocal pneumonia with progressive opacity from 6 days ago. 2. Chronic elevation of the right diaphragm. Electronically Signed   By: Monte Fantasia M.D.   On: 10/26/2020 11:04    Assessment/Plan 1. Acute hypoxemic respiratory failure due to COVID-19 [facility]) -sats getting better and he's been able to walk around the unit -he's eager to do more but did advise  careful increases in activity in his fragile state -has oxygen available if needed but sats have maintained now until he really exerts  2. Lobar pneumonia ([place]) -resolved (during second admission)  3. Acute metabolic encephalopathy -resolved--suspect due to hypoxia, covid and usual delirium with acute illness in older adults  4. COVID-19 in immunocompromised patient [facility]) -a few months after completing chemo for B cell lymphoma -gradually recovering with PT, OT here in rehab   5. 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 Irene Limbo  6. Candidal skin infection -nystatin added to treat this at his request  7. Benign essential HTN -bp soft recently, monitor carefully for dizziness  Family/ staff Communication: d/w snf nurse and wife, [name]  Labs/tests ordered:  No new added today  Chenika Nevils L. Shandy Vi, D.O. [facility] [address], [place] [address] Cell Phone (Mon-Fri 8am-5pm):  [phone] On Call:  [phone] & follow prompts after 5pm & weekends Office Phone:  [phone] Office Fax:  [phone]

## 2020-11-19 ENCOUNTER — Inpatient Hospital Stay: Payer: PPO | Admitting: Hematology

## 2020-11-19 ENCOUNTER — Other Ambulatory Visit: Payer: Self-pay

## 2020-11-19 ENCOUNTER — Inpatient Hospital Stay: Payer: PPO | Attending: Hematology

## 2020-11-19 VITALS — BP 98/47 | HR 93 | Temp 98.4°F | Resp 16 | Ht 72.0 in | Wt 184.2 lb

## 2020-11-19 DIAGNOSIS — C8339 Diffuse large B-cell lymphoma, extranodal and solid organ sites: Secondary | ICD-10-CM

## 2020-11-19 DIAGNOSIS — R2689 Other abnormalities of gait and mobility: Secondary | ICD-10-CM | POA: Diagnosis not present

## 2020-11-19 DIAGNOSIS — Z8572 Personal history of non-Hodgkin lymphomas: Secondary | ICD-10-CM | POA: Diagnosis not present

## 2020-11-19 DIAGNOSIS — J1281 Pneumonia due to SARS-associated coronavirus: Secondary | ICD-10-CM | POA: Diagnosis not present

## 2020-11-19 DIAGNOSIS — R278 Other lack of coordination: Secondary | ICD-10-CM | POA: Diagnosis not present

## 2020-11-19 DIAGNOSIS — Z85820 Personal history of malignant melanoma of skin: Secondary | ICD-10-CM | POA: Insufficient documentation

## 2020-11-19 DIAGNOSIS — J9601 Acute respiratory failure with hypoxia: Secondary | ICD-10-CM | POA: Diagnosis not present

## 2020-11-19 DIAGNOSIS — G9341 Metabolic encephalopathy: Secondary | ICD-10-CM | POA: Diagnosis not present

## 2020-11-19 DIAGNOSIS — M6281 Muscle weakness (generalized): Secondary | ICD-10-CM | POA: Diagnosis not present

## 2020-11-19 LAB — CMP (CANCER CENTER ONLY)
ALT: 28 U/L (ref 0–44)
AST: 16 U/L (ref 15–41)
Albumin: 3.2 g/dL — ABNORMAL LOW (ref 3.5–5.0)
Alkaline Phosphatase: 54 U/L (ref 38–126)
Anion gap: 8 (ref 5–15)
BUN: 17 mg/dL (ref 8–23)
CO2: 29 mmol/L (ref 22–32)
Calcium: 8.7 mg/dL — ABNORMAL LOW (ref 8.9–10.3)
Chloride: 104 mmol/L (ref 98–111)
Creatinine: 0.83 mg/dL (ref 0.61–1.24)
GFR, Estimated: >60 mL/min (ref >60)
Glucose, Bld: 100 mg/dL — ABNORMAL HIGH (ref 70–99)
Potassium: 3.9 mmol/L (ref 3.5–5.1)
Sodium: 141 mmol/L (ref 135–145)
Total Bilirubin: 0.6 mg/dL (ref 0.3–1.2)
Total Protein: 6.2 g/dL — ABNORMAL LOW (ref 6.5–8.1)

## 2020-11-19 LAB — CBC WITH DIFFERENTIAL (CANCER CENTER ONLY)
Abs Immature Granulocytes: 0.11 10*3/uL — ABNORMAL HIGH (ref 0.00–0.07)
Basophils Absolute: 0 10*3/uL (ref 0.0–0.1)
Basophils Relative: 0 %
Eosinophils Absolute: 0.2 10*3/uL (ref 0.0–0.5)
Eosinophils Relative: 2 %
HCT: 40.1 % (ref 39.0–52.0)
Hemoglobin: 12.7 g/dL — ABNORMAL LOW (ref 13.0–17.0)
Immature Granulocytes: 1 %
Lymphocytes Relative: 8 %
Lymphs Abs: 0.6 10*3/uL — ABNORMAL LOW (ref 0.7–4.0)
MCH: 32.2 pg (ref 26.0–34.0)
MCHC: 31.7 g/dL (ref 30.0–36.0)
MCV: 101.5 fL — ABNORMAL HIGH (ref 80.0–100.0)
Monocytes Absolute: 0.9 10*3/uL (ref 0.1–1.0)
Monocytes Relative: 11 %
Neutro Abs: 6 10*3/uL (ref 1.7–7.7)
Neutrophils Relative %: 78 %
Platelet Count: 170 10*3/uL (ref 150–400)
RBC: 3.95 MIL/uL — ABNORMAL LOW (ref 4.22–5.81)
RDW: 14.8 % (ref 11.5–15.5)
WBC Count: 7.8 10*3/uL (ref 4.0–10.5)
nRBC: 0 % (ref 0.0–0.2)

## 2020-11-19 LAB — LACTATE DEHYDROGENASE: LDH: 160 U/L (ref 98–192)

## 2020-11-20 ENCOUNTER — Other Ambulatory Visit: Payer: Self-pay | Admitting: Adult Health

## 2020-11-20 ENCOUNTER — Encounter: Payer: Self-pay | Admitting: Adult Health

## 2020-11-20 ENCOUNTER — Non-Acute Institutional Stay (SKILLED_NURSING_FACILITY): Payer: PPO | Admitting: Adult Health

## 2020-11-20 DIAGNOSIS — G9341 Metabolic encephalopathy: Secondary | ICD-10-CM | POA: Diagnosis not present

## 2020-11-20 DIAGNOSIS — R2689 Other abnormalities of gait and mobility: Secondary | ICD-10-CM | POA: Diagnosis not present

## 2020-11-20 DIAGNOSIS — J9601 Acute respiratory failure with hypoxia: Secondary | ICD-10-CM

## 2020-11-20 DIAGNOSIS — U071 Immunodeficiency, unspecified: Secondary | ICD-10-CM

## 2020-11-20 DIAGNOSIS — I471 Supraventricular tachycardia: Secondary | ICD-10-CM | POA: Diagnosis not present

## 2020-11-20 DIAGNOSIS — Z8572 Personal history of non-Hodgkin lymphomas: Secondary | ICD-10-CM | POA: Diagnosis not present

## 2020-11-20 DIAGNOSIS — K5901 Slow transit constipation: Secondary | ICD-10-CM

## 2020-11-20 DIAGNOSIS — I10 Essential (primary) hypertension: Secondary | ICD-10-CM

## 2020-11-20 DIAGNOSIS — J1281 Pneumonia due to SARS-associated coronavirus: Secondary | ICD-10-CM | POA: Diagnosis not present

## 2020-11-20 DIAGNOSIS — R278 Other lack of coordination: Secondary | ICD-10-CM | POA: Diagnosis not present

## 2020-11-20 DIAGNOSIS — D849 Immunodeficiency, unspecified: Secondary | ICD-10-CM

## 2020-11-20 DIAGNOSIS — G250 Essential tremor: Secondary | ICD-10-CM

## 2020-11-20 DIAGNOSIS — M6281 Muscle weakness (generalized): Secondary | ICD-10-CM | POA: Diagnosis not present

## 2020-11-20 MED ORDER — METOPROLOL TARTRATE 25 MG PO TABS: 12.5000 mg | ORAL_TABLET | Freq: Two times a day (BID) | ORAL | 0 refills | Status: DC

## 2020-11-20 NOTE — Progress Notes (Signed)
Location:  [facility] Room Number: 151-A Place of Service:  SNF [phone])  Provider: Royal Hawthorn, NP   PCP: [name], DO Patient Care Team: [name], DO as PCP - General (Geriatric Medicine)  Extended Emergency Contact Information Primary Emergency Contact: Antonio [name] Mobile Phone: [phone] Relation: Daughter Secondary Emergency Contact: Berwanger,[name] Address: [address] [address] Antonio [name] of [place] Phone: [phone] Mobile Phone: [phone] Relation: Spouse  Code Status: DNR  Goals of care:  Advanced Directive information Advanced Directives 11/20/2020  Does Patient Have a Medical Advance Directive? Yes  Type of Advance Directive Out of facility DNR (pink MOST or yellow form)  Does patient want to make changes to medical advance directive? No - Patient declined  Copy of [place] in Chart? -  Pre-existing out of facility DNR order (yellow form or pink MOST form) Yellow form placed in chart (order not valid for inpatient use);Pink MOST form placed in chart (order not valid for inpatient use)     Allergies  Allergen Reactions  . Latex Itching    Latex tape causes itching and redness.    Chief Complaint  Patient presents with  . Discharge Note    Discharge from Rehab at [facility]     HPI:  80 y.o. male seen for discharge from [facility] rehab status post hospitalization January 10 through November 11, 2020 for acute hypoxic respiratory failure secondary to COVID-19. He has a history of B cell lymphoma, as well as congestive heart failure, hyperlipidemia, essential tremor, hypertension, anemia, among others. Initially he tested positive for COVID-19 on January 2 and developed hypoxia. He was discharged on 2 L of oxygen but is now on room air with sats in the 90s. He received remdesivir and prednisone as well as DuoNeb's. He reports he does not have any shortness  of breath or cough and feels back to his baseline. During his hospital stay he had some SVT and was placed on metoprolol and Inderal was discontinued. He does have an essential tremor in the right hand that is slightly worse in causes problems when he picks up a cup. He is ambulating independently and able to feed and dress himself without any help. MMSE was 28 out of 30 but he missed 1 point for not knowing the floor he was on and then because of his tremor he missed another point with his sentence. His daughter is called requesting a cognitive test by speech therapy. [name] does not feel he has a problem and declined the recommendation.   Past Medical History:  Diagnosis Date  . Age-related macular degeneration, dry, both eyes   . 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 Carotid stenosis, asymptomatic, left ?  . ED (erectile dysfunction)   . Elevated diaphragm    right  . Erectile dysfunction   . History of basal cell carcinoma (BCC) excision    multiple excision in office  . History of kidney stones   . History of seizure    03-03-2020 per pt x1 while taking interferan for melanoma in 2001 ,  none since, told a side effect of interferan  . History of squamous cell carcinoma excision    multiple skin excision in office  .  Hyperlipidemia   . Hypertension    followed by pcp  (03-03-2020 per pt had a stress test approx. 2010, told normal)  . LBBB (left bundle branch block)   . Lymphoma of testis ([facility]) 2000   s/p right orchiectomy   . Mass of left testis   . MGUS (monoclonal gammopathy of unknown significance)    oncologist--- dr Irene Limbo  . Mixed hyperlipidemia 10/26/2020  . Personal history of malignant melanoma of skin dermatologist--- dr Wilhemina Bonito   dx 2001 left calf area  s/p WLE ,  completed  one year interferan (03-03-2020 pt states did not involve lymph nodes, no recurrence)  . Urgency of urination   . Wears hearing aid in both ears     Past Surgical History:  Procedure Laterality Date  . CATARACT EXTRACTION W/ INTRAOCULAR LENS IMPLANT Bilateral 2018  . IR IMAGING GUIDED PORT INSERTION  03/31/2020  . MELANOMA EXCISION  10/1999  . ORCHIECTOMY Right 2000  . ORCHIECTOMY Left 03/05/2020   Procedure: ORCHIECTOMY;  Surgeon: [name], MD;  Location: [facility];  Service: Urology;  Laterality: Left;  . TONSILLECTOMY  child  . TOTAL KNEE ARTHROPLASTY Right 06/2009      reports that he has never smoked. He has never used smokeless tobacco. He reports current alcohol use of about 14.0 standard drinks of alcohol per week. He reports that he does not use drugs. Social History   Socioeconomic History  . Marital status: Married    Spouse name: Not on file  . Number of children: Not on file  . Years of education: Not on file  . Highest education level: Not on file  Occupational History  . Not on file  Tobacco Use  . Smoking status: Never Smoker  . Smokeless tobacco: Never Used  Vaping Use  . Vaping Use: Never used  Substance and Sexual Activity  . Alcohol use: Yes    Alcohol/week: 14.0 standard drinks    Types: 14 Glasses of wine per week    Comment: 2 wine daily  . Drug use: Never  . Sexual activity: Not on file  Other Topics Concern  . Not on file  Social History Narrative   Social History      Diet?good      Do you drink/eat things with caffeine? seldom      Marital status?          yes                          What year were you married? 1962      Do you live in a house, apartment, assisted living, condo, trailer, etc.? house      Is it one or more stories?1      How many persons live in your home? 2      Do you have any pets in your home? (please list) no      Highest level of education completed? college      Current or past  profession: president and [employer]      Do you exercise?   yes                                   Type & how often? [employer] work, Teacher, adult education care      Do you have a living  will? yes      Do you have a DNR form?       yes                            If not, do you want to discuss one?      Do you have signed POA/HPOA for forms?  yes      Functional Status      Do you have difficulty bathing or dressing yourself? no      Do you have difficulty preparing food or eating? no      Do you have difficulty managing your medications? no      Do you have difficulty managing your finances? no      Do you have difficulty affording your medications? no   Social Determinants of Health   Financial Resource Strain: Not on file  Food Insecurity: Not on file  Transportation Needs: Not on file  Physical Activity: Not on file  Stress: Not on file  Social Connections: Not on file  Intimate Partner Violence: Not on file   Functional Status Survey:    Allergies  Allergen Reactions  . Latex Itching    Latex tape causes itching and redness.    Pertinent  Health Maintenance Due  Topic Date Due  . INFLUENZA VACCINE  Completed  . PNA vac Low Risk Adult  Completed    Medications: Outpatient Encounter Medications as of 11/20/2020  Medication Sig  . albuterol (VENTOLIN HFA) 108 (90 Base) MCG/ACT inhaler Inhale 2 puffs into the lungs every 6 (six) hours as needed for shortness of breath.  . benzonatate (TESSALON) 100 MG capsule Take 1 capsule (100 mg total) by mouth 3 (three) times daily as needed for cough.  . Cholecalciferol 100 MCG (4000 UT) CAPS Take 1 capsule (4,000 Units total) by mouth daily.  . fluticasone (FLONASE) 50 MCG/ACT nasal spray Place 2 sprays into both nostrils daily.  Marland Kitchen Ketotifen Fumarate (REFRESH EYE ITCH RELIEF OP) Place 1 drop into both eyes daily as needed (itchy eyes).   . lactose free nutrition (BOOST) LIQD Take 237 mLs by mouth 3 (three) times  daily between meals.  . lactulose (CHRONULAC) 10 GM/15ML solution Take 15 mLs (10 g total) by mouth daily.  Marland Kitchen losartan (COZAAR) 100 MG tablet Take 1 tablet (100 mg total) by mouth daily.  . melatonin 5 MG TABS Take 5 mg by mouth.  . Multiple Vitamins-Minerals (PRESERVISION AREDS 2 PO) Take 1 capsule by mouth 2 (two) times daily.   Marland Kitchen nystatin (NYSTATIN) powder Apply 1 application topically 4 (four) times daily. Apply to groin area for yeast infection until result, may keep at bedside  . polyethylene glycol (MIRALAX / GLYCOLAX) 17 g packet Take 17 g by mouth daily.  Marland Kitchen senna-docusate (SENOKOT-S) 8.6-50 MG tablet Take 1 tablet by mouth 2 (two) times daily.  . simvastatin (ZOCOR) 40 MG tablet TAKE 1 TABLET BY MOUTH EVERY DAY  . Testosterone 20.25 MG/ACT (1.62%) GEL Apply 40.5 mg topically daily.  . valACYclovir (VALTREX) 1000 MG tablet Take 1,000 mg by mouth. Take 1000 (1 grams) PO 2 times daily for 3 days as needed for flares (herpes)  . [DISCONTINUED] ascorbic acid (VITAMIN C) 500 MG tablet Take 1 tablet (500 mg total) by mouth daily.  . [DISCONTINUED] famotidine (PEPCID) 20 MG tablet Take 1 tablet (20 mg total) by mouth daily.  . [DISCONTINUED] guaiFENesin (MUCINEX) 600 MG 12 hr tablet Take 1  tablet (600 mg total) by mouth 2 (two) times daily.  . [DISCONTINUED] ipratropium-albuterol (DUONEB) 0.5-2.5 (3) MG/3ML SOLN Inhale 3 mLs into the lungs every 6 (six) hours.  . [DISCONTINUED] metoprolol tartrate (LOPRESSOR) 25 MG tablet Take 0.5 tablets (12.5 mg total) by mouth 2 (two) times daily.  . [DISCONTINUED] OXYGEN Inhale 2 L into the lungs as directed. Continue Supplement O2 as needed.  . [DISCONTINUED] zinc sulfate 220 (50 Zn) MG capsule Take 1 capsule (220 mg total) by mouth daily.  . [DISCONTINUED] Cyanocobalamin (B-12 PO) Take 1 tablet by mouth daily.  . [DISCONTINUED] feeding supplement (ENSURE ENLIVE / ENSURE PLUS) LIQD Take 237 mLs by mouth 3 (three) times daily between meals.   No  facility-administered encounter medications on file as of 11/20/2020.    Review of Systems  Constitutional: Negative for activity change, appetite change, chills, diaphoresis, fatigue, fever and unexpected weight change.  Respiratory: Negative for cough, shortness of breath, wheezing and stridor.   Cardiovascular: Negative for chest pain, palpitations and leg swelling.  Gastrointestinal: Negative for abdominal distention, abdominal pain, constipation and diarrhea.  Genitourinary: Negative for difficulty urinating and dysuria.  Musculoskeletal: Negative for arthralgias, back pain, gait problem, joint swelling and myalgias.  Neurological: Positive for tremors. Negative for dizziness, seizures, syncope, facial asymmetry, speech difficulty, weakness and headaches.  Hematological: Negative for adenopathy. Does not bruise/bleed easily.  Psychiatric/Behavioral: Negative for agitation, behavioral problems and confusion.    Vitals:   11/20/20 0941  BP: 102/65  Pulse: (!) 103  Resp: 17  Temp: 98.6 F (37 C)  SpO2: 92%  Weight: 180 lb (81.6 kg)  Height: 6' (1.829 m)   Body mass index is 24.41 kg/m. Physical Exam Vitals and nursing note reviewed.  Constitutional:      General: He is not in acute distress.    Appearance: He is not diaphoretic.  HENT:     Head: Normocephalic and atraumatic.  Neck:     Thyroid: No thyromegaly.     Vascular: No JVD.     Trachea: No tracheal deviation.  Cardiovascular:     Rate and Rhythm: Normal rate and regular rhythm.     Heart sounds: No murmur heard.   Pulmonary:     Effort: Pulmonary effort is normal. No respiratory distress.     Breath sounds: Normal breath sounds. No wheezing.  Abdominal:     General: Bowel sounds are normal. There is no distension.     Palpations: Abdomen is soft.     Tenderness: There is no abdominal tenderness.  Musculoskeletal:     Right lower leg: No edema.  Lymphadenopathy:     Cervical: No cervical adenopathy.   Skin:    General: Skin is warm and dry.  Neurological:     Mental Status: He is alert and oriented to person, place, and time.     Cranial Nerves: No cranial nerve deficit.  Psychiatric:        Mood and Affect: Mood and affect normal.     Labs reviewed: Basic Metabolic Panel: Recent Labs    10/30/20 0150 10/31/20 0302 11/01/20 0649 11/09/20 0705 11/10/20 0756 11/17/20 0000 11/19/20 1323  NA 134* 135   < > 144 142 141 141  K 4.7 4.2   < > 4.4 3.9 4.0 3.9  CL 98 95*   < > 101 99 102 104  CO2 26 30   < > 36* 32 29* 29  GLUCOSE 172* 131*   < > 111* 94  --  100*  BUN 19 24*   < > 32* 34* 17 17  CREATININE 0.77 0.84   < > 0.96 0.90 0.7 0.83  CALCIUM 8.5* 8.7*   < > 8.9 8.9 8.7 8.7*  MG 2.2 2.1  --   --  2.4  --   --    < > = values in this interval not displayed.   Liver Function Tests: Recent Labs    11/04/20 0510 11/05/20 0325 11/19/20 1323  AST 17 17 16   ALT 43 42 28  ALKPHOS 43 52 54  BILITOT 0.8 0.6 0.6  PROT 5.3* 5.7* 6.2*  ALBUMIN 2.5* 2.8* 3.2*   No results for input(s): LIPASE, AMYLASE in the last 8760 hours. No results for input(s): AMMONIA in the last 8760 hours. CBC: Recent Labs    11/05/20 0325 11/08/20 0635 11/17/20 0000 11/19/20 1323  WBC 13.2* 12.8* 6.4 7.8  NEUTROABS 11.4* 9.4*  --  6.0  HGB 14.1 13.5 13.5 12.7*  HCT 42.0 43.1 39* 40.1  MCV 97.9 101.7*  --  101.5*  PLT 224 187 152 170   Cardiac Enzymes: No results for input(s): CKTOTAL, CKMB, CKMBINDEX, TROPONINI in the last 8760 hours. BNP: Invalid input(s): POCBNP CBG: Recent Labs    08/13/20 0659 10/28/20 0055 11/03/20 0855  GLUCAP 97 191* 200*    Procedures and Imaging Studies During Stay: CT Angio Chest PE W and/or Wo Contrast  Result Date: 10/26/2020 CLINICAL DATA:  Hypoxia, shortness of breath EXAM: CT ANGIOGRAPHY CHEST WITH CONTRAST TECHNIQUE: Multidetector CT imaging of the chest was performed using the standard protocol during bolus administration of intravenous  contrast. Multiplanar CT image reconstructions and MIPs were obtained to evaluate the vascular anatomy. CONTRAST:  44mL OMNIPAQUE IOHEXOL 350 MG/ML SOLN COMPARISON:  10/20/2020 FINDINGS: Cardiovascular: Satisfactory opacification of the pulmonary arteries to the segmental level. No evidence of pulmonary embolism. Normal heart size. No pericardial effusion. Mediastinum/Nodes: No enlarged mediastinal, hilar, or axillary lymph nodes. Thyroid gland, trachea, and esophagus demonstrate no significant findings. Lungs/Pleura: No pleural effusion or pneumothorax. Patchy ground-glass airspace disease in the upper lobe in a peripheral distribution bilaterally, right greater than left. Patchy right lower lobe airspace disease with associated right lower lobe atelectasis. Patchy ground-glass opacities in the right middle lobe and left lower lobe. Upper Abdomen: No acute abnormality. Musculoskeletal: No acute osseous abnormality. No aggressive osseous lesion. Review of the MIP images confirms the above findings. IMPRESSION: 1. No evidence of pulmonary embolus. 2. Bilateral airspace disease with a predominantly peripheral distribution. Findings are concerning for multilobar pneumonia including atypical viral pneumonia. Electronically Signed   By: Kathreen Devoid   On: 10/26/2020 14:52   DG CHEST PORT 1 VIEW  Result Date: 11/02/2020 CLINICAL DATA:  Shortness of breath. EXAM: PORTABLE CHEST 1 VIEW COMPARISON:  October 28, 2020. FINDINGS: Stable cardiomediastinal silhouette. Stable elevated right hemidiaphragm is noted with increased right basilar atelectasis or infiltrate. No pneumothorax is noted. Mild left basilar atelectasis or infiltrate is noted. Right internal jugular Port-A-Cath is unchanged. Bony thorax is unremarkable. IMPRESSION: Stable elevated right hemidiaphragm with increased right basilar atelectasis or infiltrate. Mild left basilar atelectasis or infiltrate is noted. Electronically Signed   By: Marijo Conception M.D.    On: 11/02/2020 13:19   DG CHEST PORT 1 VIEW  Result Date: 10/28/2020 CLINICAL DATA:  Dyspnea, COVID EXAM: PORTABLE CHEST 1 VIEW COMPARISON:  10/26/2020 chest radiograph. FINDINGS: Right internal jugular Port-A-Cath terminates at the cavoatrial junction. Stable cardiomediastinal silhouette with normal heart size. No  pneumothorax. No pleural effusion. Stable prominent elevation of the right hemidiaphragm. Moderate patchy opacities in the mid to lower lungs bilaterally stable. No pulmonary edema. IMPRESSION: Stable moderate patchy opacities in the mid to lower lungs bilaterally, compatible with COVID-19 pneumonia. Electronically Signed   By: Ilona Sorrel M.D.   On: 10/28/2020 12:24   DG Chest Port 1 View  Result Date: 10/26/2020 CLINICAL DATA:  Ongoing shortness of breath. COVID-19 diagnosed 2 weeks ago EXAM: PORTABLE CHEST 1 VIEW COMPARISON:  Six days ago FINDINGS: Chronic elevation of the right diaphragm. Bilateral peripheral airspace disease with increased involvement in the left lung peripherally. Borderline heart size accentuated by diaphragm elevation. Porta catheter on the right with tip at the SVC. No visible air leak or suspected pulmonary edema. Extensive artifact from EKG leads. IMPRESSION: 1. Multifocal pneumonia with progressive opacity from 6 days ago. 2. Chronic elevation of the right diaphragm. Electronically Signed   By: Monte Fantasia M.D.   On: 10/26/2020 11:04   DG Abd Portable 1V  Result Date: 11/02/2020 CLINICAL DATA:  Shortness of breath. EXAM: PORTABLE ABDOMEN - 1 VIEW COMPARISON:  July 24, 2019. FINDINGS: The bowel gas pattern is normal. No radio-opaque calculi or other significant radiographic abnormality are seen. IMPRESSION: Negative. Electronically Signed   By: Marijo Conception M.D.   On: 11/02/2020 13:20   ECHOCARDIOGRAM LIMITED  Result Date: 11/02/2020    ECHOCARDIOGRAM LIMITED REPORT   Patient Name:   Antonio [name] Date of Exam: 11/02/2020 Medical Rec #:  [id number]       Height:       72.0 in Accession #:    [id number]     Weight:       178.1 lb Date of Birth:  May 03, 1941      BSA:          2.028 m Patient Age:    70 years       BP:           98/58 mmHg Patient Gender: M              HR:           97 bpm. Exam Location:  Inpatient Procedure: Limited Echo, Limited Color Doppler and Cardiac Doppler Indications:    Dyspnea R06.00  History:        Patient has no prior history of Echocardiogram examinations.                 Covid-19 Positive; Risk Factors:Hypertension and Dyslipidemia.  Sonographer:    Mikki Santee RDCS (AE) Referring Phys: [id number] [place]  1. Left ventricular ejection fraction, by estimation, is 50%. The left ventricle has low normal function. The left ventricle has no regional wall motion abnormalities. There is mild left ventricular hypertrophy. Left ventricular diastolic parameters are  consistent with Grade I diastolic dysfunction (impaired relaxation). There is abnormal septal motion due to conduction delay.  2. Right ventricular systolic function is mildly reduced. The right ventricular size is normal. Tricuspid regurgitation signal is inadequate for assessing PA pressure.  3. The mitral valve is grossly normal. No evidence of mitral valve regurgitation.  4. The aortic valve is grossly normal. There is mild calcification of the aortic valve. Aortic valve regurgitation is not visualized. No aortic stenosis is present.  5. The inferior vena cava is normal in size with greater than 50% respiratory variability, suggesting right atrial pressure of 3 mmHg. FINDINGS  Left Ventricle: Left ventricular ejection fraction, by estimation,  is 50%. The left ventricle has low normal function. The left ventricle has no regional wall motion abnormalities. The left ventricular internal cavity size was normal in size. There is mild left ventricular hypertrophy. Abnormal septal motion due to conduction delay. Left ventricular diastolic parameters are  consistent with Grade I diastolic dysfunction (impaired relaxation). Right Ventricle: The right ventricular size is normal. Right ventricular systolic function is mildly reduced. Tricuspid regurgitation signal is inadequate for assessing PA pressure. Mitral Valve: The mitral valve is grossly normal. Tricuspid Valve: The tricuspid valve is normal in structure. Tricuspid valve regurgitation is trivial. Aortic Valve: The aortic valve is grossly normal. There is mild calcification of the aortic valve. Aortic valve regurgitation is not visualized. No aortic stenosis is present. Aorta: The aortic root is normal in size and structure. Venous: The inferior vena cava is normal in size with greater than 50% respiratory variability, suggesting right atrial pressure of 3 mmHg. LEFT VENTRICLE PLAX 2D LVIDd:         4.70 cm  Diastology LVIDs:         3.20 cm  LV e' medial:    5.77 cm/s LV PW:         1.30 cm  LV E/e' medial:  13.8 LV IVS:        1.30 cm  LV e' lateral:   9.25 cm/s LVOT diam:     2.20 cm  LV E/e' lateral: 8.6 LVOT Area:     3.80 cm  LEFT ATRIUM             Index       RIGHT ATRIUM           Index LA diam:        3.20 cm 1.58 cm/m  RA Area:     11.60 cm LA Vol (A2C):   27.0 ml 13.31 ml/m RA Volume:   20.10 ml  9.91 ml/m LA Vol (A4C):   30.3 ml 14.94 ml/m LA Biplane Vol: 29.6 ml 14.59 ml/m   AORTA Ao Root diam: 3.50 cm MITRAL VALVE MV Area (PHT): 2.80 cm     SHUNTS MV Decel Time: 271 msec     Systemic Diam: 2.20 cm MV E velocity: 79.60 cm/s MV A velocity: 103.00 cm/s MV E/A ratio:  0.77 Cherlynn Kaiser MD Electronically signed by Cherlynn Kaiser MD Signature Date/Time: 11/02/2020/5:59:16 PM    Final     Assessment/Plan:    1. COVID-19 in immunocompromised patient ([place]) Resolved Will d/c zinc, vit c, oxygen, mucinex, and duonebs Can take home albuterol inhaler to use as needed Ready for discharge F/U with Dr. Mariea Clonts in 2 weeks   2. Acute respiratory failure with hypoxia (HCC) Resolved   3. Benign  essential tremor Slightly worse. Hopefully he will have improvement in this as he gains strength and independence. He was instructed to let Dr. Mariea Clonts know if things were worsening or no improving at his appointment  4. SVT (supraventricular tachycardia) (HCC) Improved. Continue metoprolol 12.5 mg bid   5. Slow transit constipation Controlled with miralax and senokot.   6. Primary hypertension Controlled Continue losartan 100 mg qd  He did not exhibit memory loss during his stay and declined further evaluation. Instructed him to report any concerns to [facility] or Dr. Mariea Clonts for additional feed back.   Discharge review, assessment, plan, and coordination took >30 min Patient is being discharged with the following home health services:  NA  Patient is being discharged with the following durable medical  equipment:  NA  Patient has been advised to f/u with their PCP in 1-2 weeks to for a transitions of care visit.  Social services at their facility was responsible for arranging this appointment.  Pt was provided with adequate prescriptions of noncontrolled medications to reach the scheduled appointment .  For controlled substances, a limited supply was provided as appropriate for the individual patient.  If the pt normally receives these medications from a pain clinic or has a contract with another physician, these medications should be received from that clinic or physician only).    Future labs/tests needed:  NA

## 2020-11-22 DIAGNOSIS — U071 COVID-19: Secondary | ICD-10-CM | POA: Diagnosis not present

## 2020-11-23 DIAGNOSIS — U071 COVID-19: Secondary | ICD-10-CM | POA: Diagnosis not present

## 2020-11-24 DIAGNOSIS — J9601 Acute respiratory failure with hypoxia: Secondary | ICD-10-CM | POA: Diagnosis not present

## 2020-11-24 DIAGNOSIS — Z8572 Personal history of non-Hodgkin lymphomas: Secondary | ICD-10-CM | POA: Diagnosis not present

## 2020-11-24 DIAGNOSIS — R2689 Other abnormalities of gait and mobility: Secondary | ICD-10-CM | POA: Diagnosis not present

## 2020-11-24 DIAGNOSIS — M6281 Muscle weakness (generalized): Secondary | ICD-10-CM | POA: Diagnosis not present

## 2020-11-24 DIAGNOSIS — R278 Other lack of coordination: Secondary | ICD-10-CM | POA: Diagnosis not present

## 2020-11-24 DIAGNOSIS — J1281 Pneumonia due to SARS-associated coronavirus: Secondary | ICD-10-CM | POA: Diagnosis not present

## 2020-11-24 DIAGNOSIS — G9341 Metabolic encephalopathy: Secondary | ICD-10-CM | POA: Diagnosis not present

## 2020-11-25 DIAGNOSIS — R2689 Other abnormalities of gait and mobility: Secondary | ICD-10-CM | POA: Diagnosis not present

## 2020-11-25 DIAGNOSIS — M6281 Muscle weakness (generalized): Secondary | ICD-10-CM | POA: Diagnosis not present

## 2020-11-25 DIAGNOSIS — G9341 Metabolic encephalopathy: Secondary | ICD-10-CM | POA: Diagnosis not present

## 2020-11-25 DIAGNOSIS — Z8572 Personal history of non-Hodgkin lymphomas: Secondary | ICD-10-CM | POA: Diagnosis not present

## 2020-11-25 DIAGNOSIS — R278 Other lack of coordination: Secondary | ICD-10-CM | POA: Diagnosis not present

## 2020-11-25 DIAGNOSIS — J1281 Pneumonia due to SARS-associated coronavirus: Secondary | ICD-10-CM | POA: Diagnosis not present

## 2020-11-25 DIAGNOSIS — J9601 Acute respiratory failure with hypoxia: Secondary | ICD-10-CM | POA: Diagnosis not present

## 2020-11-25 NOTE — Addendum Note (Signed)
Encounter addended by: Paul Dykes, RN on: 11/25/2020 6:22 PM  Actions taken: Charge Capture section accepted

## 2020-11-27 DIAGNOSIS — M6281 Muscle weakness (generalized): Secondary | ICD-10-CM | POA: Diagnosis not present

## 2020-11-27 DIAGNOSIS — R2689 Other abnormalities of gait and mobility: Secondary | ICD-10-CM | POA: Diagnosis not present

## 2020-11-27 DIAGNOSIS — Z8572 Personal history of non-Hodgkin lymphomas: Secondary | ICD-10-CM | POA: Diagnosis not present

## 2020-11-27 DIAGNOSIS — G9341 Metabolic encephalopathy: Secondary | ICD-10-CM | POA: Diagnosis not present

## 2020-11-27 DIAGNOSIS — J1281 Pneumonia due to SARS-associated coronavirus: Secondary | ICD-10-CM | POA: Diagnosis not present

## 2020-11-27 DIAGNOSIS — R278 Other lack of coordination: Secondary | ICD-10-CM | POA: Diagnosis not present

## 2020-11-27 DIAGNOSIS — J9601 Acute respiratory failure with hypoxia: Secondary | ICD-10-CM | POA: Diagnosis not present

## 2020-11-30 DIAGNOSIS — J1281 Pneumonia due to SARS-associated coronavirus: Secondary | ICD-10-CM | POA: Diagnosis not present

## 2020-11-30 DIAGNOSIS — R278 Other lack of coordination: Secondary | ICD-10-CM | POA: Diagnosis not present

## 2020-11-30 DIAGNOSIS — Z8572 Personal history of non-Hodgkin lymphomas: Secondary | ICD-10-CM | POA: Diagnosis not present

## 2020-11-30 DIAGNOSIS — G9341 Metabolic encephalopathy: Secondary | ICD-10-CM | POA: Diagnosis not present

## 2020-11-30 DIAGNOSIS — J9601 Acute respiratory failure with hypoxia: Secondary | ICD-10-CM | POA: Diagnosis not present

## 2020-11-30 DIAGNOSIS — M6281 Muscle weakness (generalized): Secondary | ICD-10-CM | POA: Diagnosis not present

## 2020-11-30 DIAGNOSIS — R2689 Other abnormalities of gait and mobility: Secondary | ICD-10-CM | POA: Diagnosis not present

## 2020-12-02 ENCOUNTER — Non-Acute Institutional Stay: Payer: PPO | Admitting: Internal Medicine

## 2020-12-02 ENCOUNTER — Other Ambulatory Visit: Payer: Self-pay

## 2020-12-02 ENCOUNTER — Encounter: Payer: Self-pay | Admitting: Internal Medicine

## 2020-12-02 VITALS — BP 118/70 | HR 85 | Temp 97.3°F | Ht 72.0 in | Wt 191.8 lb

## 2020-12-02 DIAGNOSIS — Z8616 Personal history of COVID-19: Secondary | ICD-10-CM | POA: Diagnosis not present

## 2020-12-02 DIAGNOSIS — M5030 Other cervical disc degeneration, unspecified cervical region: Secondary | ICD-10-CM | POA: Diagnosis not present

## 2020-12-02 DIAGNOSIS — B372 Candidiasis of skin and nail: Secondary | ICD-10-CM

## 2020-12-02 DIAGNOSIS — G250 Essential tremor: Secondary | ICD-10-CM

## 2020-12-02 DIAGNOSIS — C8339 Diffuse large B-cell lymphoma, extranodal and solid organ sites: Secondary | ICD-10-CM

## 2020-12-02 MED ORDER — NYSTATIN 100000 UNIT/GM EX POWD: 1.0000 "application " | Freq: Four times a day (QID) | CUTANEOUS | 3 refills | Status: DC

## 2020-12-02 MED ORDER — ZINC OXIDE 13 % EX CREA: TOPICAL_OINTMENT | Freq: Two times a day (BID) | CUTANEOUS | 3 refills | Status: DC | PRN

## 2020-12-02 MED ORDER — MELOXICAM 7.5 MG PO TABS: 7.5000 mg | ORAL_TABLET | Freq: Every day | ORAL | 3 refills | Status: DC

## 2020-12-02 NOTE — Progress Notes (Addendum)
Location:  [facility] Provider: Zula Hovsepian L. Mariea Clonts, D.O., C.M.D.  Code Status: DNR Goals of Care:  Advanced Directives 12/02/2020  Does Patient Have a Medical Advance Directive? Yes  Type of Advance Directive Out of facility DNR (pink MOST or yellow form)  Does patient want to make changes to medical advance directive? -  Copy of [place] in Chart? No - copy requested  Pre-existing out of facility DNR order (yellow form or pink MOST form) Yellow form placed in chart (order not valid for inpatient use);Pink MOST form placed in chart (order not valid for inpatient use)     Chief Complaint  Patient presents with  . Medical Management of Chronic Issues    Follow up from being discharged for Rehab     HPI: Patient is a 80 y.o. male seen today for hospital follow-up s/p admission with acute hypoxic respiratory failure due to covid x 2 last of which was 1/10-1/26/22 and rehab stay thru 2/4.   His sats are still dropping when ambulating a long distance--down to 86% today when he walked into the clinic nurse's office.  He was then able to catch his breath and sats came up over 90%.  Wants to go back on meloxicam due to neck pain and stiffness. He is getting limited turning to the right.  Discussed taking with food and hydrating well.  Wondering about med refills--has gotten supply left from rehab--nothing will change with this.  I had seen him due to his wife and daughter's concerns about his memory also, but the memory loss seemed to be related to events amid his covid infection as he has a period of amnesia.  He's had a few ensure drinks since leaving rehab but not taking them daily.  Appetite is good.  Says life is normal other than the trip he [place] to the mountains where his sats dropped to 80stg after he unpacked everything and brought it into their home there and they decided to turn around and come back to [employer].  It came back up like clockwork as they  drove down the mountain.  Melatonin is not helping him rest.  He's tried it over the years also and it's not been effective. He's been using the benadryl (just three so far).  Advised against it b/c it dries him up and could affect his cognition and balance.  He is trying to take a nap after working hard for several hours beforehand.  He does not think that's the problem.  He's now off the steroids long enough that they can't be responsible.  Rested well 10pm to 6am last night and also rested well the night before. He's hoping this is a sign that his sleeping problem is resolving.  He's doing well working with Thamas Jaegers from [place].  They're monitoring his oxygen as he does his exercises.  He's getting stronger and also going to the gym on his own.  Past Medical History:  Diagnosis Date  . Age-related macular degeneration, dry, both eyes   . 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 Carotid stenosis, asymptomatic, left ?  . ED (erectile dysfunction)   . Elevated diaphragm    right  . Erectile dysfunction   . History of basal cell carcinoma (BCC) excision    multiple excision in office  .  History of kidney stones   . History of seizure    03-03-2020 per pt x1 while taking interferan for melanoma in 2001 ,  none since, told a side effect of interferan  . History of squamous cell carcinoma excision    multiple skin excision in office  . Hyperlipidemia   . Hypertension    followed by pcp  (03-03-2020 per pt had a stress test approx. 2010, told normal)  . LBBB (left bundle branch block)   . Lymphoma of testis ([place]) 2000   s/p right orchiectomy   . Mass of left testis   . MGUS (monoclonal gammopathy of unknown significance)    oncologist--- dr Irene Limbo  . Mixed hyperlipidemia 10/26/2020  . Personal history of  malignant melanoma of skin dermatologist--- dr Wilhemina Bonito   dx 2001 left calf area  s/p WLE ,  completed one year interferan (03-03-2020 pt states did not involve lymph nodes, no recurrence)  . Urgency of urination   . Wears hearing aid in both ears     Past Surgical History:  Procedure Laterality Date  . CATARACT EXTRACTION W/ INTRAOCULAR LENS IMPLANT Bilateral 2018  . IR IMAGING GUIDED PORT INSERTION  03/31/2020  . MELANOMA EXCISION  10/1999  . ORCHIECTOMY Right 2000  . ORCHIECTOMY Left 03/05/2020   Procedure: ORCHIECTOMY;  Surgeon: [name], MD;  Location: [facility];  Service: Urology;  Laterality: Left;  . TONSILLECTOMY  child  . TOTAL KNEE ARTHROPLASTY Right 06/2009    Allergies  Allergen Reactions  . Latex Itching    Latex tape causes itching and redness.    Outpatient Encounter Medications as of 12/02/2020  Medication Sig  . meloxicam (MOBIC) 7.5 MG tablet Take 1 tablet (7.5 mg total) by mouth daily.  . mupirocin 2% oint-hydrocortisone 2.5% cream-nystatin cream-zinc oxide 13% oint 1:1:1:5 mixture Apply topically 2 (two) times daily as needed for rash, itching or irritation.  Marland Kitchen albuterol (VENTOLIN HFA) 108 (90 Base) MCG/ACT inhaler Inhale 2 puffs into the lungs every 6 (six) hours as needed for shortness of breath.  . benzonatate (TESSALON) 100 MG capsule Take 1 capsule (100 mg total) by mouth 3 (three) times daily as needed for cough.  . Cholecalciferol 100 MCG (4000 UT) CAPS Take 1 capsule (4,000 Units total) by mouth daily.  . fluticasone (FLONASE) 50 MCG/ACT nasal spray Place 2 sprays into both nostrils daily.  Marland Kitchen Ketotifen Fumarate (REFRESH EYE ITCH RELIEF OP) Place 1 drop into both eyes daily as needed (itchy eyes).   . lactose free nutrition (BOOST) LIQD Take 237 mLs by mouth 3 (three) times daily between meals.  . lactulose (CHRONULAC) 10 GM/15ML solution Take 15 mLs (10 g total) by mouth daily.  Marland Kitchen losartan (COZAAR) 100 MG tablet Take 1 tablet (100 mg  total) by mouth daily.  . melatonin 5 MG TABS Take 5 mg by mouth.  . metoprolol tartrate (LOPRESSOR) 25 MG tablet Take 0.5 tablets (12.5 mg total) by mouth 2 (two) times daily.  . Multiple Vitamins-Minerals (PRESERVISION AREDS 2 PO) Take 1 capsule by mouth 2 (two) times daily.   Marland Kitchen nystatin (NYSTATIN) powder Apply 1 application topically 4 (four) times daily. Apply to groin area for yeast infection until resolved  . polyethylene glycol (MIRALAX / GLYCOLAX) 17 g packet Take 17 g by mouth daily.  Marland Kitchen senna-docusate (SENOKOT-S) 8.6-50 MG tablet Take 1 tablet by mouth 2 (two) times daily.  . simvastatin (ZOCOR) 40 MG tablet TAKE 1 TABLET BY MOUTH EVERY DAY  .  Testosterone 20.25 MG/ACT (1.62%) GEL Apply 40.5 mg topically daily.  . valACYclovir (VALTREX) 1000 MG tablet Take 1,000 mg by mouth. Take 1000 (1 grams) PO 2 times daily for 3 days as needed for flares (herpes)  . [DISCONTINUED] nystatin (NYSTATIN) powder Apply 1 application topically 4 (four) times daily. Apply to groin area for yeast infection until result, may keep at bedside   No facility-administered encounter medications on file as of 12/02/2020.    Review of Systems:  Review of Systems  Constitutional: Negative for chills and fever.  HENT: Positive for congestion. Negative for sore throat.        Flonase helps  Eyes: Negative for blurred vision.  Respiratory: Negative for cough, sputum production, shortness of breath and wheezing.        Notes benefit to albuterol inhaler  Cardiovascular: Negative for chest pain, palpitations and leg swelling.  Gastrointestinal: Negative for abdominal pain and constipation.  Genitourinary: Negative for dysuria.  Musculoskeletal: Negative for back pain, falls and joint pain.  Neurological: Negative for dizziness and loss of consciousness.  Endo/Heme/Allergies: Bruises/bleeds easily.  Psychiatric/Behavioral: Negative for depression. The patient has insomnia. The patient is not nervous/anxious.         Amnesia re: events surrounding covid admissions    Health Maintenance  Topic Date Due  . COVID-19 Vaccine (4 - Booster for Moderna series) 12/12/2020  . TETANUS/TDAP  03/04/2024  . INFLUENZA VACCINE  Completed  . PNA vac Low Risk Adult  Completed    Physical Exam: Vitals:   12/02/20 0904  BP: 118/70  Pulse: 85  Temp: (!) 97.3 F (36.3 C)  TempSrc: Temporal  SpO2: 96%  Weight: 191 lb 12.8 oz (87 kg)  Height: 6' (1.829 m)   Body mass index is 26.01 kg/m. Physical Exam Vitals reviewed.  Constitutional:      General: He is not in acute distress.    Appearance: Normal appearance. He is not toxic-appearing.  HENT:     Head: Normocephalic and atraumatic.  Eyes:     Conjunctiva/sclera: Conjunctivae normal.     Pupils: Pupils are equal, round, and reactive to light.  Cardiovascular:     Rate and Rhythm: Normal rate and regular rhythm.     Pulses: Normal pulses.     Heart sounds: Normal heart sounds.  Pulmonary:     Effort: Pulmonary effort is normal.     Breath sounds: Normal breath sounds. No wheezing, rhonchi or rales.  Abdominal:     General: Bowel sounds are normal.     Palpations: Abdomen is soft.  Musculoskeletal:        General: Normal range of motion.     Right lower leg: No edema.     Left lower leg: No edema.  Skin:    General: Skin is warm and dry.  Neurological:     General: No focal deficit present.     Mental Status: He is alert.  Psychiatric:        Mood and Affect: Mood normal.        Behavior: Behavior normal.    Labs reviewed: Basic Metabolic Panel: Recent Labs    10/30/20 0150 10/31/20 0302 11/01/20 0649 11/09/20 0705 11/10/20 0756 11/17/20 0000 11/19/20 1323  NA 134* 135   < > 144 142 141 141  K 4.7 4.2   < > 4.4 3.9 4.0 3.9  CL 98 95*   < > 101 99 102 104  CO2 26 30   < > 36* 32 29*  29  GLUCOSE 172* 131*   < > 111* 94  --  100*  BUN 19 24*   < > 32* 34* 17 17  CREATININE 0.77 0.84   < > 0.96 0.90 0.7 0.83  CALCIUM 8.5* 8.7*   <  > 8.9 8.9 8.7 8.7*  MG 2.2 2.1  --   --  2.4  --   --    < > = values in this interval not displayed.   Liver Function Tests: Recent Labs    11/04/20 0510 11/05/20 0325 11/19/20 1323  AST 17 17 16   ALT 43 42 28  ALKPHOS 43 52 54  BILITOT 0.8 0.6 0.6  PROT 5.3* 5.7* 6.2*  ALBUMIN 2.5* 2.8* 3.2*   No results for input(s): LIPASE, AMYLASE in the last 8760 hours. No results for input(s): AMMONIA in the last 8760 hours. CBC: Recent Labs    11/05/20 0325 11/08/20 0635 11/17/20 0000 11/19/20 1323  WBC 13.2* 12.8* 6.4 7.8  NEUTROABS 11.4* 9.4*  --  6.0  HGB 14.1 13.5 13.5 12.7*  HCT 42.0 43.1 39* 40.1  MCV 97.9 101.7*  --  101.5*  PLT 224 187 152 170   Lipid Panel: Recent Labs    02/27/20 0000 10/20/20 1400  CHOL 132  --   HDL 65  --   LDLCALC 56  --   TRIG 56 108   Lab Results  Component Value Date   HGBA1C 5.7 02/27/2020    Procedures since last visit: CT Angio Chest PE W and/or Wo Contrast  Result Date: 10/26/2020 CLINICAL DATA:  Hypoxia, shortness of breath EXAM: CT ANGIOGRAPHY CHEST WITH CONTRAST TECHNIQUE: Multidetector CT imaging of the chest was performed using the standard protocol during bolus administration of intravenous contrast. Multiplanar CT image reconstructions and MIPs were obtained to evaluate the vascular anatomy. CONTRAST:  63mL OMNIPAQUE IOHEXOL 350 MG/ML SOLN COMPARISON:  10/20/2020 FINDINGS: Cardiovascular: Satisfactory opacification of the pulmonary arteries to the segmental level. No evidence of pulmonary embolism. Normal heart size. No pericardial effusion. Mediastinum/Nodes: No enlarged mediastinal, hilar, or axillary lymph nodes. Thyroid gland, trachea, and esophagus demonstrate no significant findings. Lungs/Pleura: No pleural effusion or pneumothorax. Patchy ground-glass airspace disease in the upper lobe in a peripheral distribution bilaterally, right greater than left. Patchy right lower lobe airspace disease with associated right lower  lobe atelectasis. Patchy ground-glass opacities in the right middle lobe and left lower lobe. Upper Abdomen: No acute abnormality. Musculoskeletal: No acute osseous abnormality. No aggressive osseous lesion. Review of the MIP images confirms the above findings. IMPRESSION: 1. No evidence of pulmonary embolus. 2. Bilateral airspace disease with a predominantly peripheral distribution. Findings are concerning for multilobar pneumonia including atypical viral pneumonia. Electronically Signed   By: Kathreen Devoid   On: 10/26/2020 14:52   DG Chest Port 1 View  Result Date: 10/26/2020 CLINICAL DATA:  Ongoing shortness of breath. COVID-19 diagnosed 2 weeks ago EXAM: PORTABLE CHEST 1 VIEW COMPARISON:  Six days ago FINDINGS: Chronic elevation of the right diaphragm. Bilateral peripheral airspace disease with increased involvement in the left lung peripherally. Borderline heart size accentuated by diaphragm elevation. Porta catheter on the right with tip at the SVC. No visible air leak or suspected pulmonary edema. Extensive artifact from EKG leads. IMPRESSION: 1. Multifocal pneumonia with progressive opacity from 6 days ago. 2. Chronic elevation of the right diaphragm. Electronically Signed   By: Monte Fantasia M.D.   On: 10/26/2020 11:04   Assessment/Plan 1. Candidal skin infection -ongoing, but  somewhat improved -also had herpes treatment - nystatin (NYSTATIN) powder; Apply 1 application topically 4 (four) times daily. Apply to groin area for yeast infection until resolved  Dispense: 60 g; Refill: 3 - mupirocin 2% oint-hydrocortisone 2.5% cream-nystatin cream-zinc oxide 13% oint 1:1:1:5 mixture; Apply topically 2 (two) times daily as needed for rash, itching or irritation.  Dispense: 120 g; Refill: 3  2. DDD (degenerative disc disease), cervical -has been off mobic for a while and pain has started to return in his neck, wants to restart -recommended WITH food and regular hydration - meloxicam (MOBIC) 7.5 MG  tablet; Take 1 tablet (7.5 mg total) by mouth daily.  Dispense: 90 tablet; Refill: 3  3. History of COVID-19 -still having some desaturation when he exerts himself and at high elevation  4. Benign essential tremor -appears to be getting worse, but he was stressed and anxious about some bills he received from ambulance transport  5. 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 Jeffie Pollock  Labs/tests ordered:  no new added Next appt:  03/10/2021  Makya Yurko L. Damean Poffenberger, D.O. [facility] [address], [place] [address] Cell Phone (Mon-Fri 8am-5pm):  [phone] On Call:  [phone] & follow prompts after 5pm & weekends Office Phone:  [phone] Office Fax:  [phone]

## 2020-12-04 DIAGNOSIS — G9341 Metabolic encephalopathy: Secondary | ICD-10-CM | POA: Diagnosis not present

## 2020-12-04 DIAGNOSIS — Z8572 Personal history of non-Hodgkin lymphomas: Secondary | ICD-10-CM | POA: Diagnosis not present

## 2020-12-04 DIAGNOSIS — R278 Other lack of coordination: Secondary | ICD-10-CM | POA: Diagnosis not present

## 2020-12-04 DIAGNOSIS — M6281 Muscle weakness (generalized): Secondary | ICD-10-CM | POA: Diagnosis not present

## 2020-12-04 DIAGNOSIS — R2689 Other abnormalities of gait and mobility: Secondary | ICD-10-CM | POA: Diagnosis not present

## 2020-12-04 DIAGNOSIS — J1281 Pneumonia due to SARS-associated coronavirus: Secondary | ICD-10-CM | POA: Diagnosis not present

## 2020-12-04 DIAGNOSIS — J9601 Acute respiratory failure with hypoxia: Secondary | ICD-10-CM | POA: Diagnosis not present

## 2020-12-06 ENCOUNTER — Other Ambulatory Visit: Payer: Self-pay | Admitting: Internal Medicine

## 2020-12-06 DIAGNOSIS — M5030 Other cervical disc degeneration, unspecified cervical region: Secondary | ICD-10-CM

## 2020-12-07 ENCOUNTER — Encounter: Payer: Self-pay | Admitting: Internal Medicine

## 2020-12-08 NOTE — Progress Notes (Signed)
[facility] Note  12/09/2020     CHIEF COMPLAINT Patient presents for Retina Follow Up   HISTORY OF PRESENT ILLNESS: Antonio [name] is a 80 y.o. male who presents to the clinic today for:   HPI    Retina Follow Up    Patient presents with  Dry AMD.  In both eyes.  Duration of 6 months.  Since onset it is stable.  I, the attending physician,  performed the HPI with the patient and updated documentation appropriately.          Comments    6 month follow up AMD OU- No new problems since last visit, vision stable OU. Uses Refresh prn.        Last edited by Bernarda Caffey, MD on 12/09/2020  2:10 PM. (History)    pt states his lymphoma came back and he had chemo again, he states he then got pneumonia in one of his lungs which turned to covid, he ended up in the ICU for several weeks with sepsis, pt is doing better now, no change in vision  Referring physician: [name], DO [place],  [place] [address]  HISTORICAL INFORMATION:   Selected notes from the [facility] for ARMD OU; Moved from [place], [place]  Ocular Hx - nonexudative ARMD OU; PVD OU; pseudophakia OU (Toric OU - 12/2016 by Dr. Nance Pear, [place], [facility]);  PMH - HTN; [facility] - ARMD - Mother   CURRENT MEDICATIONS: Current Outpatient Medications (Ophthalmic Drugs)  Medication Sig  . Ketotifen Fumarate (REFRESH EYE ITCH RELIEF OP) Place 1 drop into both eyes daily as needed (itchy eyes).    No current facility-administered medications for this visit. (Ophthalmic Drugs)   Current Outpatient Medications (Other)  Medication Sig  . albuterol (VENTOLIN HFA) 108 (90 Base) MCG/ACT inhaler Inhale 2 puffs into the lungs every 6 (six) hours as needed for shortness of breath.  . benzonatate (TESSALON) 100 MG capsule Take 1 capsule (100 mg total) by mouth 3 (three) times daily as needed for cough.  . Cholecalciferol 100 MCG (4000 UT) CAPS Take 1 capsule (4,000  Units total) by mouth daily.  . fluticasone (FLONASE) 50 MCG/ACT nasal spray Place 2 sprays into both nostrils daily.  Marland Kitchen lactose free nutrition (BOOST) LIQD Take 237 mLs by mouth 3 (three) times daily between meals.  . lactulose (CHRONULAC) 10 GM/15ML solution Take 15 mLs (10 g total) by mouth daily.  Marland Kitchen losartan (COZAAR) 100 MG tablet Take 1 tablet (100 mg total) by mouth daily.  . melatonin 5 MG TABS Take 5 mg by mouth.  . meloxicam (MOBIC) 7.5 MG tablet Take 1 tablet (7.5 mg total) by mouth daily.  . metoprolol tartrate (LOPRESSOR) 25 MG tablet Take 0.5 tablets (12.5 mg total) by mouth 2 (two) times daily.  . Multiple Vitamins-Minerals (PRESERVISION AREDS 2 PO) Take 1 capsule by mouth 2 (two) times daily.   . mupirocin 2% oint-hydrocortisone 2.5% cream-nystatin cream-zinc oxide 13% oint 1:1:1:5 mixture Apply topically 2 (two) times daily as needed for rash, itching or irritation.  Marland Kitchen nystatin (NYSTATIN) powder Apply 1 application topically 4 (four) times daily. Apply to groin area for yeast infection until resolved  . polyethylene glycol (MIRALAX / GLYCOLAX) 17 g packet Take 17 g by mouth daily.  Marland Kitchen senna-docusate (SENOKOT-S) 8.6-50 MG tablet Take 1 tablet by mouth 2 (two) times daily.  . simvastatin (ZOCOR) 40 MG tablet TAKE 1 TABLET BY MOUTH  EVERY DAY  . Testosterone 20.25 MG/ACT (1.62%) GEL Apply 40.5 mg topically daily.  . valACYclovir (VALTREX) 1000 MG tablet Take 1,000 mg by mouth. Take 1000 (1 grams) PO 2 times daily for 3 days as needed for flares (herpes)   No current facility-administered medications for this visit. (Other)      REVIEW OF SYSTEMS: ROS    Positive for: Neurological, Genitourinary, Musculoskeletal, Endocrine, Cardiovascular, Eyes, Heme/Lymph   Negative for: Constitutional, Gastrointestinal, Skin, HENT, Respiratory, Psychiatric, Allergic/Imm   Last edited by Leonie Douglas, COA on 12/09/2020  1:33 PM. (History)       ALLERGIES Allergies  Allergen Reactions  .  Latex Itching    Latex tape causes itching and redness.    PAST MEDICAL HISTORY Past Medical History:  Diagnosis Date  . Age-related macular degeneration, dry, both eyes   . 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 Carotid stenosis, asymptomatic, left ?  . ED (erectile dysfunction)   . Elevated diaphragm    right  . Erectile dysfunction   . History of basal cell carcinoma (BCC) excision    multiple excision in office  . History of kidney stones   . History of seizure    03-03-2020 per pt x1 while taking interferan for melanoma in 2001 ,  none since, told a side effect of interferan  . History of squamous cell carcinoma excision    multiple skin excision in office  . Hyperlipidemia   . Hypertension    followed by pcp  (03-03-2020 per pt had a stress test approx. 2010, told normal)  . LBBB (left bundle branch block)   . Lymphoma of testis ([facility]) 2000   s/p right orchiectomy   . Mass of left testis   . MGUS (monoclonal gammopathy of unknown significance)    oncologist--- dr Irene Limbo  . Mixed hyperlipidemia 10/26/2020  . Personal history of malignant melanoma of skin dermatologist--- dr Wilhemina Bonito   dx 2001 left calf area  s/p WLE ,  completed one year interferan (03-03-2020 pt states did not involve lymph nodes, no recurrence)  . Urgency of urination   . Wears hearing aid in both ears    Past Surgical History:  Procedure Laterality Date  . CATARACT EXTRACTION W/ INTRAOCULAR LENS IMPLANT Bilateral 2018  . IR IMAGING GUIDED PORT INSERTION  03/31/2020  . MELANOMA EXCISION  10/1999  . ORCHIECTOMY Right 2000  . ORCHIECTOMY Left 03/05/2020   Procedure: ORCHIECTOMY;  Surgeon: [name], MD;  Location: [facility];  Service: Urology;  Laterality: Left;  . TONSILLECTOMY  child  .  TOTAL KNEE ARTHROPLASTY Right 06/2009    FAMILY HISTORY Family History  Problem Relation Age of Onset  . Cataracts Mother   . Transient ischemic attack Mother   . Asthma Mother   . Cataracts Father   . Diabetes Father        borderline  . Hypertension Father   . Hyperlipidemia Father   . Hyperlipidemia Son   . Hypertension Son   . Amblyopia Neg Hx   . Blindness Neg Hx   . Glaucoma Neg Hx   . Macular degeneration Neg Hx   . Retinal detachment Neg Hx   . Strabismus Neg Hx   . Retinitis pigmentosa Neg Hx     SOCIAL HISTORY Social History  Tobacco Use  . Smoking status: Never Smoker  . Smokeless tobacco: Never Used  Vaping Use  . Vaping Use: Never used  Substance Use Topics  . Alcohol use: Yes    Alcohol/week: 14.0 standard drinks    Types: 14 Glasses of wine per week    Comment: 2 wine daily  . Drug use: Never         OPHTHALMIC EXAM:  Base Eye Exam    Visual Acuity (Snellen - Linear)      Right Left   Dist cc 20/20 20/20 -2   Correction: Glasses       Tonometry (Tonopen, 1:38 PM)      Right Left   Pressure 13 14       Pupils      Dark Light Shape React APD   Right 3 2 Round Brisk None   Left 3 2 Round Brisk None       Visual Fields (Counting fingers)      Left Right    Full Full       Extraocular Movement      Right Left    Full Full       Neuro/Psych    Oriented x3: Yes   Mood/Affect: Normal       Dilation    Both eyes: 1.0% Mydriacyl, 2.5% Phenylephrine @ 1:38 PM        Slit Lamp and Fundus Exam    External Exam      Right Left   External Brow ptosis - mild Brow ptosis - mild       Slit Lamp Exam      Right Left   Lids/Lashes Dermatochalasis - upper lid Dermatochalasis - upper lid   Conjunctiva/Sclera mild inferior Conjunctivochalasis mild inferior Conjunctivochalasis, nasal Pinguecula   Cornea Arcus, trace Punctate epithelial erosions, Well healed temporal cataract wounds Arcus, Well healed cataract wounds, trace Punctate  epithelial erosions   Anterior Chamber Deep and quiet Deep and quiet   Iris Round with moderate dilated to 29mm Round with moderate dilated to 59mm   Lens toric Posterior chamber intraocular lens in good postion with marks at 0300 and 0900, trace Posterior capsular opacification Toric Posterior chamber intraocular lens in good position with marks at 0200 and 0800; trace Posterior capsular opacification   Vitreous Vitreous syneresis, Posterior vitreous detachment Vitreous syneresis, Posterior vitreous detachment, vitreous condensations       Fundus Exam      Right Left   Disc Mild pallor, sharp rim, Compact, Tilted with Peripapillary atrophy 360 compact, mild tilt, Peripapillary atrophy 360, mild Pallor, Sharp rim   C/D Ratio 0.4 0.3   Macula Flat, Blunted foveal reflex, Drusen, RPE mottling, clumping and atrophy - stable, mild, No heme or edema, focal atrophy Good foveal reflex, Drusen, RPE mottling, clumping and atrophy - stable, mild, No heme or edema, focal atrophy   Vessels attenuated, Tortuous attenuated, Tortuous   Periphery Attached; scattered Reticular degeneration, No heme  Attached; scattered Reticular degeneration, No heme           IMAGING AND PROCEDURES  Imaging and Procedures for 11/10/17  OCT, Retina - OU - Both Eyes       Right Eye Quality was good. Central Foveal Thickness: 318. Progression has been stable. Findings include normal foveal contour, no IRF, no SRF, retinal drusen , outer retinal atrophy, myopic contour (Stable focal areas of outer retinal atrophy / ellipsoid dropout; scattered drusen).   Left Eye Quality was good. Central Foveal  Thickness: 315. Progression has been stable. Findings include normal foveal contour, no IRF, no SRF, retinal drusen , outer retinal atrophy, myopic contour (Scattered drusen; focal ORA IT to fovea).   Notes Images captured and stored on drive  Diagnosis / Impression:  Nonexudative ARMD OU - no significant change from  prior  Clinical management:  See below  Abbreviations: NFP - Normal foveal profile. CME - cystoid macular edema. PED - pigment epithelial detachment. IRF - intraretinal fluid. SRF - subretinal fluid. EZ - ellipsoid zone. ERM - epiretinal membrane. ORA - outer retinal atrophy. ORT - outer retinal tubulation. SRHM - subretinal hyper-reflective material                  ASSESSMENT/PLAN:    ICD-10-CM   1. Intermediate stage nonexudative age-related macular degeneration of both eyes  H35.3132   2. Retinal edema  H35.81 OCT, Retina - OU - Both Eyes  3. Pseudophakia of both eyes  Z96.1   4. Ocular hypertension, bilateral  H40.053   5. Bilateral posterior capsular opacification  H26.493     1. Age related macular degeneration, non-exudative, both eyes   - intermediate stage -- stable  - The incidence, anatomy, and pathology of dry AMD, risk of progression, and the AREDS and AREDS 2 study including smoking risks discussed with patient.  - no longer using Foresee AMD monitoring device due to insurance issues  - no significant progression on exam or OCT -- scattered drusen and ORA  - f/u 9 months, sooner PRN -- DFE/OCT  2. No retinal edema on exam or OCT  3. Pseudophakia OU  - s/p CE/PCIOL OU (Toric + femto) 12/2016 by Dr. Nance Pear in [facility]  - had post op IOP issue OS  - beautiful surgery, doing well  - monitro  4. H/o ocular hypertension OU  - IOP good today (13,14) off combigan  - monitor  5. PCO OU  - Discussed YAG Cap treatment for PCO;  - not yet visually significant  - monitor for now  Ophthalmic Meds Ordered this visit:  No orders of the defined types were placed in this encounter.      Return in about 9 months (around 09/08/2021) for f/u non-exu ARMD OU, DFE, OCT.  There are no Patient Instructions on file for this visit.  This document serves as a record of services personally performed by Gardiner Sleeper, MD, PhD. It was created on their behalf by Roselee Nova, COMT. The creation of this record is the provider's dictation and/or activities during the visit.  Electronically signed by: Roselee Nova, COMT 12/09/20 4:47 PM   This document serves as a record of services personally performed by Gardiner Sleeper, MD, PhD. It was created on their behalf by San Jetty. Owens Shark, OA an ophthalmic technician. The creation of this record is the provider's dictation and/or activities during the visit.    Electronically signed by: San Jetty. Owens Shark, [place] 02.23.2022 4:47 PM  Gardiner Sleeper, M.D., Ph.D. Diseases & Surgery of the Retina and Vitreous [facility]  I have reviewed the above documentation for accuracy and completeness, and I agree with the above. Gardiner Sleeper, M.D., Ph.D. 12/09/20 4:47 PM  Abbreviations: M myopia (nearsighted); A astigmatism; H hyperopia (farsighted); P presbyopia; Mrx spectacle prescription;  CTL contact lenses; OD right eye; OS left eye; OU both eyes  XT exotropia; ET esotropia; PEK punctate epithelial keratitis; PEE punctate epithelial erosions; DES dry eye syndrome; MGD meibomian  gland dysfunction; ATs artificial tears; PFAT's preservative free artificial tears; [place] nuclear sclerotic cataract; PSC posterior subcapsular cataract; ERM epi-retinal membrane; PVD posterior vitreous detachment; RD retinal detachment; DM diabetes mellitus; DR diabetic retinopathy; NPDR non-proliferative diabetic retinopathy; PDR proliferative diabetic retinopathy; CSME clinically significant macular edema; DME diabetic macular edema; dbh dot blot hemorrhages; CWS cotton wool spot; POAG primary open angle glaucoma; C/D cup-to-disc ratio; HVF humphrey visual field; GVF goldmann visual field; OCT optical coherence tomography; IOP intraocular pressure; BRVO Branch retinal vein occlusion; CRVO central retinal vein occlusion; CRAO central retinal artery occlusion; BRAO branch retinal artery occlusion; RT retinal tear; SB scleral buckle; PPV pars  plana vitrectomy; VH Vitreous hemorrhage; PRP panretinal laser photocoagulation; IVK intravitreal kenalog; VMT vitreomacular traction; MH Macular hole;  NVD neovascularization of the disc; NVE neovascularization elsewhere; AREDS age related eye disease study; ARMD age related macular degeneration; POAG primary open angle glaucoma; EBMD epithelial/anterior basement membrane dystrophy; ACIOL anterior chamber intraocular lens; IOL intraocular lens; PCIOL posterior chamber intraocular lens; Phaco/IOL phacoemulsification with intraocular lens placement; [place] photorefractive keratectomy; LASIK laser assisted in situ keratomileusis; HTN hypertension; DM diabetes mellitus; COPD chronic obstructive pulmonary disease

## 2020-12-09 ENCOUNTER — Ambulatory Visit (INDEPENDENT_AMBULATORY_CARE_PROVIDER_SITE_OTHER): Payer: PPO | Admitting: Ophthalmology

## 2020-12-09 ENCOUNTER — Encounter (INDEPENDENT_AMBULATORY_CARE_PROVIDER_SITE_OTHER): Payer: Self-pay | Admitting: Ophthalmology

## 2020-12-09 ENCOUNTER — Other Ambulatory Visit: Payer: Self-pay

## 2020-12-09 DIAGNOSIS — H3581 Retinal edema: Secondary | ICD-10-CM

## 2020-12-09 DIAGNOSIS — H26493 Other secondary cataract, bilateral: Secondary | ICD-10-CM

## 2020-12-09 DIAGNOSIS — H40053 Ocular hypertension, bilateral: Secondary | ICD-10-CM

## 2020-12-09 DIAGNOSIS — H353132 Nonexudative age-related macular degeneration, bilateral, intermediate dry stage: Secondary | ICD-10-CM | POA: Diagnosis not present

## 2020-12-09 DIAGNOSIS — Z961 Presence of intraocular lens: Secondary | ICD-10-CM | POA: Diagnosis not present

## 2020-12-11 DIAGNOSIS — M6281 Muscle weakness (generalized): Secondary | ICD-10-CM | POA: Diagnosis not present

## 2020-12-11 DIAGNOSIS — J9601 Acute respiratory failure with hypoxia: Secondary | ICD-10-CM | POA: Diagnosis not present

## 2020-12-11 DIAGNOSIS — R2689 Other abnormalities of gait and mobility: Secondary | ICD-10-CM | POA: Diagnosis not present

## 2020-12-11 DIAGNOSIS — R278 Other lack of coordination: Secondary | ICD-10-CM | POA: Diagnosis not present

## 2020-12-11 DIAGNOSIS — G9341 Metabolic encephalopathy: Secondary | ICD-10-CM | POA: Diagnosis not present

## 2020-12-11 DIAGNOSIS — J1281 Pneumonia due to SARS-associated coronavirus: Secondary | ICD-10-CM | POA: Diagnosis not present

## 2020-12-11 DIAGNOSIS — Z8572 Personal history of non-Hodgkin lymphomas: Secondary | ICD-10-CM | POA: Diagnosis not present

## 2020-12-16 ENCOUNTER — Encounter: Payer: Self-pay | Admitting: Internal Medicine

## 2020-12-16 ENCOUNTER — Non-Acute Institutional Stay: Payer: PPO | Admitting: Internal Medicine

## 2020-12-16 ENCOUNTER — Other Ambulatory Visit: Payer: Self-pay

## 2020-12-16 ENCOUNTER — Other Ambulatory Visit: Payer: Self-pay | Admitting: Internal Medicine

## 2020-12-16 VITALS — BP 130/86 | HR 125 | Temp 97.5°F | Ht 72.0 in | Wt 192.0 lb

## 2020-12-16 DIAGNOSIS — I471 Supraventricular tachycardia, unspecified: Secondary | ICD-10-CM

## 2020-12-16 DIAGNOSIS — R2990 Unspecified symptoms and signs involving the nervous system: Secondary | ICD-10-CM

## 2020-12-16 DIAGNOSIS — U099 Post covid-19 condition, unspecified: Secondary | ICD-10-CM

## 2020-12-16 DIAGNOSIS — K5901 Slow transit constipation: Secondary | ICD-10-CM

## 2020-12-16 DIAGNOSIS — G4700 Insomnia, unspecified: Secondary | ICD-10-CM

## 2020-12-16 DIAGNOSIS — R0609 Other forms of dyspnea: Secondary | ICD-10-CM

## 2020-12-16 DIAGNOSIS — J9611 Chronic respiratory failure with hypoxia: Secondary | ICD-10-CM | POA: Diagnosis not present

## 2020-12-16 MED ORDER — METOPROLOL TARTRATE 25 MG PO TABS: 12.5000 mg | ORAL_TABLET | Freq: Two times a day (BID) | ORAL | 3 refills | Status: DC

## 2020-12-16 MED ORDER — FLUTICASONE PROPIONATE 50 MCG/ACT NA SUSP: 2.0000 | Freq: Every day | NASAL | 5 refills | Status: DC

## 2020-12-16 NOTE — Progress Notes (Signed)
Location:  [facility] clinic Provider: Rodolfo Gaster L. Mariea Clonts, D.O., C.M.D.  Code Status: DNR Goals of Care:  Advanced Directives 12/02/2020  Does Patient Have a Medical Advance Directive? Yes  Type of Advance Directive Out of facility DNR (pink MOST or yellow form)  Does patient want to make changes to medical advance directive? -  Copy of [place] in Chart? No - copy requested  Pre-existing out of facility DNR order (yellow form or pink MOST form) Yellow form placed in chart (order not valid for inpatient use);Pink MOST form placed in chart (order not valid for inpatient use)   Chief Complaint  Patient presents with  . Acute Visit    Requesting letter of necessity for Ambulance and trying to get Oxygen tank.    HPI: Patient is a 80 y.o. male seen today for an acute visit for fulfilling medicare requirements for portable oxygen and to request a letter to see if insurance will authorize coverage of his 1/4 trip to the ED for COVID-19 and hypoxic respiratory failure.  He works out with Thamas Jaegers on [employer] and sats have been ok with him per pt.  He denies sob walking in here.  However, when 6 minute walk was done, POX dropped to 75% on room air and he was quite winded and needed rest.  sats went up to 93% on 2L via [place].    The last two nights, he's gotten up and he'll be sitting on the bed and his sats are reading 60-70% with his pulse oximeter.  Gradually it works its' way up to 90%. We discussed potential sleep apnea testing, but he was resistant to this.     As in my last note, [place] home and they then improved.    Past Medical History:  Diagnosis Date  . Age-related macular degeneration, dry, both eyes   . 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 Carotid stenosis, asymptomatic, left ?  . ED (erectile dysfunction)   . Elevated diaphragm    right  . Erectile dysfunction   . History of basal cell carcinoma (BCC) excision    multiple excision in office  . History of kidney stones   . History of seizure    03-03-2020 per pt x1 while taking interferan for melanoma in 2001 ,  none since, told a side effect of interferan  . History of squamous cell carcinoma excision    multiple skin excision in office  . Hyperlipidemia   . Hypertension    followed by pcp  (03-03-2020 per pt had a stress test approx. 2010, told normal)  . LBBB (left bundle branch block)   . Lymphoma of testis ([place]) 2000   s/p right orchiectomy   . Mass of left testis   . MGUS (monoclonal gammopathy of unknown significance)    oncologist--- dr Irene Limbo  . Mixed hyperlipidemia 10/26/2020  . Personal history of malignant melanoma of skin dermatologist--- dr Wilhemina Bonito   dx 2001 left calf area  s/p WLE ,  completed one year interferan (03-03-2020 pt states did not involve lymph nodes, no recurrence)  . Urgency of urination   . Wears hearing aid in both ears     Past Surgical  History:  Procedure Laterality Date  . CATARACT EXTRACTION W/ INTRAOCULAR LENS IMPLANT Bilateral 2018  . IR IMAGING GUIDED PORT INSERTION  03/31/2020  . MELANOMA EXCISION  10/1999  . ORCHIECTOMY Right 2000  . ORCHIECTOMY Left 03/05/2020   Procedure: ORCHIECTOMY;  Surgeon: [name], MD;  Location: [facility];  Service: Urology;  Laterality: Left;  . TONSILLECTOMY  child  . TOTAL KNEE ARTHROPLASTY Right 06/2009    Allergies  Allergen Reactions  . Latex Itching    Latex tape causes itching and redness.    Outpatient Encounter Medications as of 12/16/2020  Medication Sig  . albuterol (VENTOLIN HFA) 108 (90 Base) MCG/ACT inhaler Inhale 2 puffs into the lungs every 6 (six) hours as needed for shortness of breath.  . benzonatate (TESSALON)  100 MG capsule Take 1 capsule (100 mg total) by mouth 3 (three) times daily as needed for cough.  . Carboxymethylcellul-Glycerin (REFRESH RELIEVA OP) One drop both eyes daily as needed for itchy eyes.  . Cholecalciferol 100 MCG (4000 UT) CAPS Take 1 capsule (4,000 Units total) by mouth daily.  Marland Kitchen lactose free nutrition (BOOST) LIQD Take 237 mLs by mouth 3 (three) times daily between meals.  . lactulose (CHRONULAC) 10 GM/15ML solution Take 15 mLs (10 g total) by mouth daily.  Marland Kitchen losartan (COZAAR) 100 MG tablet Take 1 tablet (100 mg total) by mouth daily.  . melatonin 5 MG TABS Take 5 mg by mouth.  . meloxicam (MOBIC) 7.5 MG tablet Take 1 tablet (7.5 mg total) by mouth daily.  . Multiple Vitamins-Minerals (PRESERVISION AREDS 2 PO) Take 1 capsule by mouth 2 (two) times daily.   . mupirocin 2% oint-hydrocortisone 2.5% cream-nystatin cream-zinc oxide 13% oint 1:1:1:5 mixture Apply topically 2 (two) times daily as needed for rash, itching or irritation.  Marland Kitchen nystatin (NYSTATIN) powder Apply 1 application topically 4 (four) times daily. Apply to groin area for yeast infection until resolved  . polyethylene glycol (MIRALAX / GLYCOLAX) 17 g packet Take 17 g by mouth daily.  Marland Kitchen senna-docusate (SENOKOT-S) 8.6-50 MG tablet Take 1 tablet by mouth 2 (two) times daily.  . simvastatin (ZOCOR) 40 MG tablet TAKE 1 TABLET BY MOUTH EVERY DAY  . Testosterone 20.25 MG/ACT (1.62%) GEL Apply 40.5 mg topically daily.  . valACYclovir (VALTREX) 1000 MG tablet Take 1000 (1 grams) PO 2 times daily for 3 days as needed for flares (herpes)  . [DISCONTINUED] fluticasone (FLONASE) 50 MCG/ACT nasal spray Place 2 sprays into both nostrils daily.  . [DISCONTINUED] metoprolol tartrate (LOPRESSOR) 25 MG tablet Take 0.5 tablets (12.5 mg total) by mouth 2 (two) times daily.  . [DISCONTINUED] Ketotifen Fumarate (REFRESH EYE ITCH RELIEF OP) Place 1 drop into both eyes daily as needed (itchy eyes).    No facility-administered encounter  medications on file as of 12/16/2020.    Review of Systems:  Review of Systems  Constitutional: Negative for chills and fever.  HENT: Positive for congestion.        Using nasonex with benefit  Eyes: Negative for blurred vision.  Respiratory: Positive for cough and shortness of breath. Negative for sputum production.        Dry cough, has not needed nebs in 3 days  Cardiovascular: Negative for chest pain, palpitations and leg swelling.  Gastrointestinal: Negative for abdominal pain.  Genitourinary: Negative for dysuria.  Musculoskeletal: Negative for falls and joint pain.  Neurological: Negative for dizziness and loss of consciousness.  Endo/Heme/Allergies: Bruises/bleeds easily.  Psychiatric/Behavioral: Positive for memory loss. Negative  for depression. The patient has insomnia. The patient is not nervous/anxious.     Health Maintenance  Topic Date Due  . COVID-19 Vaccine (4 - Booster for Moderna series) 12/12/2020  . TETANUS/TDAP  03/04/2024  . INFLUENZA VACCINE  Completed  . PNA vac Low Risk Adult  Completed  . HPV VACCINES  Aged Out    Physical Exam: Vitals:   12/16/20 1437 12/16/20 1533  BP: 130/86   Pulse: 87 (!) 125  Temp: (!) 97.5 F (36.4 C)   TempSrc: Skin   SpO2: 99% (!) 75%  Weight: 192 lb (87.1 kg)   Height: 6' (1.829 m)    Body mass index is 26.04 kg/m. Physical Exam Vitals reviewed.  Constitutional:      Appearance: Normal appearance.  Cardiovascular:     Rate and Rhythm: Normal rate and regular rhythm.  Pulmonary:     Effort: Pulmonary effort is normal.     Breath sounds: Normal breath sounds. No wheezing, rhonchi or rales.  Musculoskeletal:        General: Normal range of motion.  Skin:    General: Skin is warm and dry.  Neurological:     General: No focal deficit present.     Mental Status: He is alert and oriented to person, place, and time.     Labs reviewed: Basic Metabolic Panel: Recent Labs    10/30/20 0150 10/31/20 0302  11/01/20 0649 11/09/20 0705 11/10/20 0756 11/17/20 0000 11/19/20 1323  NA 134* 135   < > 144 142 141 141  K 4.7 4.2   < > 4.4 3.9 4.0 3.9  CL 98 95*   < > 101 99 102 104  CO2 26 30   < > 36* 32 29* 29  GLUCOSE 172* 131*   < > 111* 94  --  100*  BUN 19 24*   < > 32* 34* 17 17  CREATININE 0.77 0.84   < > 0.96 0.90 0.7 0.83  CALCIUM 8.5* 8.7*   < > 8.9 8.9 8.7 8.7*  MG 2.2 2.1  --   --  2.4  --   --    < > = values in this interval not displayed.   Liver Function Tests: Recent Labs    11/04/20 0510 11/05/20 0325 11/19/20 1323  AST 17 17 16   ALT 43 42 28  ALKPHOS 43 52 54  BILITOT 0.8 0.6 0.6  PROT 5.3* 5.7* 6.2*  ALBUMIN 2.5* 2.8* 3.2*   No results for input(s): LIPASE, AMYLASE in the last 8760 hours. No results for input(s): AMMONIA in the last 8760 hours. CBC: Recent Labs    11/05/20 0325 11/08/20 0635 11/17/20 0000 11/19/20 1323  WBC 13.2* 12.8* 6.4 7.8  NEUTROABS 11.4* 9.4*  --  6.0  HGB 14.1 13.5 13.5 12.7*  HCT 42.0 43.1 39* 40.1  MCV 97.9 101.7*  --  101.5*  PLT 224 187 152 170   Lipid Panel: Recent Labs    02/27/20 0000 10/20/20 1400  CHOL 132  --   HDL 65  --   LDLCALC 56  --   TRIG 56 108   Lab Results  Component Value Date   HGBA1C 5.7 02/27/2020    Procedures since last visit: OCT, Retina - OU - Both Eyes  Result Date: 12/09/2020 Right Eye Quality was good. Central Foveal Thickness: 318. Progression has been stable. Findings include normal foveal contour, no IRF, no SRF, retinal drusen , outer retinal atrophy, myopic contour (Stable focal areas  of outer retinal atrophy / ellipsoid dropout; scattered drusen). Left Eye Quality was good. Central Foveal Thickness: 315. Progression has been stable. Findings include normal foveal contour, no IRF, no SRF, retinal drusen , outer retinal atrophy, myopic contour (Scattered drusen; focal ORA IT to fovea). Notes Images captured and stored on drive Diagnosis / Impression: Nonexudative ARMD OU - no  significant change from prior Clinical management: See below Abbreviations: NFP - Normal foveal profile. CME - cystoid macular edema. PED - pigment epithelial detachment. IRF - intraretinal fluid. SRF - subretinal fluid. EZ - ellipsoid zone. ERM - epiretinal membrane. ORA - outer retinal atrophy. ORT - outer retinal tubulation. SRHM - subretinal hyper-reflective material    Assessment/Plan 1. Chronic respiratory failure with hypoxia (HCC) -qualifies for portable oxygen with sats dropping to 75% with exertion, returns to 93% with 2L [place]  -sats at rest ranged from 93-99% here in clinic without oxygen -he is notably dyspneic on exertion -will send DME order for portable oxygen to [employer]  -his wife would like him referred to Dr. Chase Caller for further eval -cont nebs  2. 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 Post-COVID chronic neurologic symptoms -his short-term memory has been affected since his covid infection -most notably, he has amnesia to events around his acute illness, but he's also struggling to retain information provided last visit to this visit which could be due to periods of hypoxia or "covid brain"  4. SVT (supraventricular tachycardia) (HCC) -cont low dose metoprolol started at the hospital for periods of tachycardia - explained that the only meds he was to stop after completing were the vitamins he received in rehab post-covid  5. Slow transit constipation -may reduce senokot-s to as needed basis  6. Insomnia, unspecified type -wakes up some nights one or two times, but says he's able to rest again later -his wife brought up sleep apnea, but he declined eval for this despite my suggestion--"i'm not there yet"  Labs/tests ordered:  Pulmonary referral, letter done for ambulance bill coverage and dme  order for portable oxygen  Next appt:  03/10/2021  One hour spent in total on patient visit and coordination of care with [facility] staff and family after visit  [place]. Bliss Tsang, D.O. [facility] [address],  [address] Cell Phone (Mon-Fri 8am-5pm):  [phone] On Call:  [phone] & follow prompts after 5pm & weekends Office Phone:  [phone] Office Fax:  [phone]

## 2020-12-17 ENCOUNTER — Other Ambulatory Visit: Payer: Self-pay | Admitting: Internal Medicine

## 2020-12-18 ENCOUNTER — Telehealth: Payer: Self-pay | Admitting: *Deleted

## 2020-12-18 ENCOUNTER — Other Ambulatory Visit: Payer: PPO

## 2020-12-18 NOTE — Telephone Encounter (Signed)
LMOM to return call.

## 2020-12-18 NOTE — Telephone Encounter (Signed)
Per Dr. Allison Quarry send Antonio [name] note along with the DME order for portable oxygen gas machine to use 2L via [place] prn sats under 90% to [facility].    Order and OV notes faxed to [facility] Fax: [phone]

## 2020-12-18 NOTE — Telephone Encounter (Signed)
Patient called and stated that he received a letter from his insurance stating that OT and Therapeutic exercises were approved through his insurance with [facility].   Patient is wanting to know if this is something you ordered.  Please Advise.

## 2020-12-18 NOTE — Telephone Encounter (Signed)
That's his therapy at [facility].  It was [facility] and now has the new name, [facility].

## 2020-12-21 ENCOUNTER — Telehealth: Payer: Self-pay

## 2020-12-21 NOTE — Telephone Encounter (Signed)
Patient notified and agreed.  

## 2020-12-21 NOTE — Telephone Encounter (Signed)
Patient called to check on the status of his referral for the pulmonologist and when I spoke with Rodena Piety she said the referral had been sent to [facility] patient also stated he would like to be referred to a Dr. Corey Skains Pulmonolgist this message was sent to [facility] as she has the referral

## 2020-12-22 ENCOUNTER — Telehealth: Payer: Self-pay | Admitting: Internal Medicine

## 2020-12-22 ENCOUNTER — Telehealth: Payer: Self-pay | Admitting: *Deleted

## 2020-12-22 NOTE — Telephone Encounter (Signed)
Patient called and stated that [facility] called him yesterday and told him that he did not Qualify for the Free Portable Oxygen but he could Purchase.  They told him that he uses too much Oxygen.  Patient thinks there is a disconnect somewhere because he had only used the Oxygen 3-4 days between [facility].   I called [facility] [phone] and spoke with Aura. She stated that patient was APPROVED but patient has a monthly Copay with his Insurance of $32.56. She stated that patient understood and told her that he had to think about it and call her back.   Please Advise.

## 2020-12-22 NOTE — Telephone Encounter (Signed)
He needs to have this oxygen.  That copay is not unreasonable given the cost of the equipment.  I hope he will agree to proceed.

## 2020-12-22 NOTE — Telephone Encounter (Signed)
Call from Dr Lorin Picket Velora Heckler - Sela Hua - is his neighbor. Having post covid worsening. Wants to be seen this week. I am in hospital. Please facilitate to who can be seen this week

## 2020-12-22 NOTE — Telephone Encounter (Signed)
Patient worked in with Dr. Vaughan Browner on 12/24/2020 - pt aware of appt-pr

## 2020-12-22 NOTE — Telephone Encounter (Signed)
Patient notified

## 2020-12-22 NOTE — Telephone Encounter (Signed)
Spoke with MR Hauswirth 12/22/20, provided # [phone] to call & sch appt [facility]  Thanks, Vilinda Blanks

## 2020-12-23 DIAGNOSIS — J9601 Acute respiratory failure with hypoxia: Secondary | ICD-10-CM | POA: Diagnosis not present

## 2020-12-23 DIAGNOSIS — E291 Testicular hypofunction: Secondary | ICD-10-CM | POA: Diagnosis not present

## 2020-12-24 ENCOUNTER — Encounter: Payer: Self-pay | Admitting: Pulmonary Disease

## 2020-12-24 ENCOUNTER — Ambulatory Visit: Payer: PPO | Admitting: Pulmonary Disease

## 2020-12-24 ENCOUNTER — Other Ambulatory Visit: Payer: Self-pay

## 2020-12-24 VITALS — BP 116/70 | HR 76 | Temp 97.2°F | Ht 72.0 in | Wt 189.2 lb

## 2020-12-24 DIAGNOSIS — R0609 Other forms of dyspnea: Secondary | ICD-10-CM

## 2020-12-24 DIAGNOSIS — U099 Post covid-19 condition, unspecified: Secondary | ICD-10-CM | POA: Diagnosis not present

## 2020-12-24 MED ORDER — PREDNISONE 10 MG PO TABS
40.0000 mg | ORAL_TABLET | Freq: Every day | ORAL | 0 refills | Status: DC
Start: 2020-12-24 — End: 2021-03-10

## 2020-12-24 MED ORDER — AZITHROMYCIN 250 MG PO TABS: ORAL_TABLET | ORAL | 0 refills | Status: DC

## 2020-12-24 NOTE — Progress Notes (Signed)
KALIM KISSEL    [id number]    [birth year]  Primary Care Physician:Reed, Rexene Edison, DO  Referring Physician: [name], DO [place],  [place] [address]  Chief complaint:  Consult for post Covid syndrome  HPI: 80 year old with history of hypertension, recurrent testicular large B-cell lymphoma Hospitalized for COVID-19 in January 2022.  He was immunized and boosted against COVID Treated with remdesivir, prednisone.  Hospital course complicated by SVT, diastolic heart failure.  He went to rehab till mid February.  Weaned off oxygen before being sent home. He was on a Pred taper till mid February.  Complains of chronic dyspnea on exertion.  Reports slightly increased dyspnea with congestion over the past week with O2 sats dropping to the mid 80s.  He also has post Covid cognitive issues and forgetfulness. Denies any fevers, chills.  He has history of large B-cell lymphoma in 2009 recurrence in 2021.  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  No significant recent travel Relevant family history: No family history of lung disease  Outpatient Encounter Medications as of 12/24/2020  Medication Sig  . benzonatate (TESSALON) 100 MG capsule Take 1 capsule (100 mg total) by mouth 3 (three) times daily as needed for cough.  . Carboxymethylcellul-Glycerin (REFRESH RELIEVA OP) One drop both eyes daily as needed for itchy eyes.  . Cholecalciferol 100 MCG (4000 UT) CAPS Take 1 capsule (4,000 Units total) by mouth daily.  . fluticasone (FLONASE) 50 MCG/ACT nasal spray Place 2 sprays into both nostrils daily.  Marland Kitchen lactose free nutrition (BOOST) LIQD Take 237 mLs by mouth 3 (three) times daily between meals.  Marland Kitchen losartan (COZAAR) 100 MG tablet Take 1  tablet (100 mg total) by mouth daily.  . meloxicam (MOBIC) 7.5 MG tablet Take 1 tablet (7.5 mg total) by mouth daily.  . metoprolol tartrate (LOPRESSOR) 25 MG tablet Take 0.5 tablets (12.5 mg total) by mouth 2 (two) times daily.  . Multiple Vitamins-Minerals (PRESERVISION AREDS 2 PO) Take 1 capsule by mouth 2 (two) times daily.   . mupirocin 2% oint-hydrocortisone 2.5% cream-nystatin cream-zinc oxide 13% oint 1:1:1:5 mixture Apply topically 2 (two) times daily as needed for rash, itching or irritation.  Marland Kitchen nystatin (NYSTATIN) powder Apply 1 application topically 4 (four) times daily. Apply to groin area for yeast infection until resolved  . senna-docusate (SENOKOT-S) 8.6-50 MG tablet Take 1 tablet by mouth 2 (two) times daily.  . simvastatin (ZOCOR) 40 MG tablet TAKE 1 TABLET BY MOUTH EVERY DAY  . Testosterone 20.25 MG/ACT (1.62%) GEL Apply 40.5 mg topically daily.  . valACYclovir (VALTREX) 1000 MG tablet Take 1000 (1 grams) PO 2 times daily for 3 days as needed for flares (herpes)  . albuterol (VENTOLIN HFA) 108 (90 Base) MCG/ACT inhaler Inhale 2 puffs into the lungs every 6 (six) hours as needed for shortness of breath. (Patient not taking: Reported on 12/24/2020)  . lactulose (CHRONULAC) 10 GM/15ML solution Take 15 mLs (10 g total) by mouth daily. (Patient not taking: Reported on 12/24/2020)  . melatonin 5 MG TABS Take 5 mg by mouth. (Patient not taking: Reported on 12/24/2020)  . polyethylene glycol (MIRALAX / GLYCOLAX) 17 g packet Take 17 g by mouth daily. (Patient not taking: Reported on 12/24/2020)   No facility-administered encounter  medications on file as of 12/24/2020.    Allergies as of 12/24/2020 - Review Complete 12/24/2020  Allergen Reaction Noted  . Latex Itching 03/12/2020    Past Medical History:  Diagnosis Date  . Age-related macular degeneration, dry, both eyes   . B-cell lymphoma [facility]) oncologist--- dr Irene Limbo   dx 2000 primary right testicular diffuse large b-cell lymphoma;   s/p  right orchiectomy 2000, completed chemo, intrathcal chemo (spine injection's), and radiation to left testis in 2000  . Carotid stenosis, asymptomatic, left ?  . ED (erectile dysfunction)   . Elevated diaphragm    right  . Erectile dysfunction   . History of basal cell carcinoma (BCC) excision    multiple excision in office  . History of kidney stones   . History of seizure    03-03-2020 per pt x1 while taking interferan for melanoma in 2001 ,  none since, told a side effect of interferan  . History of squamous cell carcinoma excision    multiple skin excision in office  . Hyperlipidemia   . Hypertension    followed by pcp  (03-03-2020 per pt had a stress test approx. 2010, told normal)  . LBBB (left bundle branch block)   . Lymphoma of testis ([place]) 2000   s/p right orchiectomy   . Mass of left testis   . MGUS (monoclonal gammopathy of unknown significance)    oncologist--- dr Irene Limbo  . Mixed hyperlipidemia 10/26/2020  . Personal history of malignant melanoma of skin dermatologist--- dr Wilhemina Bonito   dx 2001 left calf area  s/p WLE ,  completed one year interferan (03-03-2020 pt states did not involve lymph nodes, no recurrence)  . Urgency of urination   . Wears hearing aid in both ears     Past Surgical History:  Procedure Laterality Date  . CATARACT EXTRACTION W/ INTRAOCULAR LENS IMPLANT Bilateral 2018  . IR IMAGING GUIDED PORT INSERTION  03/31/2020  . MELANOMA EXCISION  10/1999  . ORCHIECTOMY Right 2000  . ORCHIECTOMY Left 03/05/2020   Procedure: ORCHIECTOMY;  Surgeon: [name], MD;  Location: [facility];  Service: Urology;  Laterality: Left;  . TONSILLECTOMY  child  . TOTAL KNEE ARTHROPLASTY Right 06/2009    Family History  Problem Relation Age of Onset  . Cataracts Mother   . Transient ischemic attack Mother   . Asthma Mother   . Cataracts Father   . Diabetes Father        borderline  . Hypertension Father   . Hyperlipidemia Father   .  Hyperlipidemia Son   . Hypertension Son   . Amblyopia Neg Hx   . Blindness Neg Hx   . Glaucoma Neg Hx   . Macular degeneration Neg Hx   . Retinal detachment Neg Hx   . Strabismus Neg Hx   . Retinitis pigmentosa Neg Hx     Social History   Socioeconomic History  . Marital status: Married    Spouse name: Not on file  . Number of children: Not on file  . Years of education: Not on file  . Highest education level: Not on file  Occupational History  . Not on file  Tobacco Use  . Smoking status: Never Smoker  . Smokeless tobacco: Never Used  Vaping Use  . Vaping Use: Never used  Substance and Sexual Activity  . Alcohol use: Yes    Alcohol/week: 14.0 standard drinks    Types: 14 Glasses of wine per week  Comment: 2 wine daily  . Drug use: Never  . Sexual activity: Not on file  Other Topics Concern  . Not on file  Social History Narrative   Social History      Diet?good      Do you drink/eat things with caffeine? seldom      Marital status?          yes                          What year were you married? 1962      Do you live in a house, apartment, assisted living, condo, trailer, etc.? house      Is it one or more stories?1      How many persons live in your home? 2      Do you have any pets in your home? (please list) no      Highest level of education completed? college      Current or past profession: president and [employer]      Do you exercise?   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 yes      Do you have a DNR form?       yes                            If not, do you want to discuss one?      Do you have signed POA/HPOA for forms?  yes      Functional Status      Do you have difficulty bathing or dressing yourself? no      Do you have difficulty preparing food or eating? no      Do you have difficulty managing your medications? no      Do  you have difficulty managing your finances? no      Do you have difficulty affording your medications? no   Social Determinants of Health   Financial Resource Strain: Not on file  Food Insecurity: Not on file  Transportation Needs: Not on file  Physical Activity: Not on file  Stress: Not on file  Social Connections: Not on file  Intimate Partner Violence: Not on file    Review of systems: Review of Systems  Constitutional: Negative for fever and chills.  HENT: Negative.   Eyes: Negative for blurred vision.  Respiratory: as per HPI  Cardiovascular: Negative for chest pain and palpitations.  Gastrointestinal: Negative for vomiting, diarrhea, blood per rectum. Genitourinary: Negative for dysuria, urgency, frequency and hematuria.  Musculoskeletal: Negative for myalgias, back pain and joint pain.  Skin: Negative for itching and rash.  Neurological: Negative for dizziness, tremors, focal weakness, seizures and loss of consciousness.  Endo/Heme/Allergies: Negative for environmental allergies.  Psychiatric/Behavioral: Negative for depression, suicidal ideas and hallucinations.  All other systems reviewed and are negative.  Physical Exam: Blood pressure 116/70, pulse 76, temperature (!) 97.2 F (36.2 C), temperature source Temporal, height 6' (1.829 m), weight 189 lb 3.2 oz (85.8 kg), SpO2 94 %. Gen:      No acute distress HEENT:  EOMI, sclera anicteric  Neck:     No masses; no thyromegaly Lungs:    Clear to auscultation bilaterally; normal respiratory effort CV:         Regular rate and rhythm; no murmurs Abd:      + bowel sounds; soft, non-tender; no palpable masses, no distension Ext:    No edema; adequate peripheral perfusion Skin:      Warm and dry; no rash Neuro: alert and oriented x 3 Psych: normal mood and affect  Data Reviewed: Imaging: CT chest 10/20/2020-multifocal infiltrates consistent with pneumonia CTA 10/26/2020-no PE, multifocal infiltrates.  I have reviewed the  images personally.  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  I am not sure if there is worsening of post Covid pneumonitis or an infection I will treat him with a Z-Pak, prednisone for 7 days.  He would like to avoid a longer course of prednisone as previously steroids caused insomnia  Check high-res CT and PFTs Qualify for supplemental oxygen via concentrator  Plan/Recommendations: Z-Pak, prednisone 40 mg a day for 7 days High-res CT, PFTs Supplemental oxygen  Marshell Garfinkel MD [facility] 12/24/2020, 10:01 AM  CC: Hollace Kinnier L, DO

## 2020-12-24 NOTE — Patient Instructions (Signed)
Will prescribe a Z-Pak Prednisone 40 mg a day for 7 days  Schedule high-res CT and pulmonary function test Return to clinic in the next 1 to 2 months for reevaluation.

## 2020-12-25 ENCOUNTER — Other Ambulatory Visit (HOSPITAL_COMMUNITY): Payer: PPO

## 2020-12-25 ENCOUNTER — Ambulatory Visit: Payer: PPO | Admitting: Hematology

## 2020-12-25 NOTE — Telephone Encounter (Signed)
Patient called and stated that he has received his Oxygen Equipment but they told him that he uses too much Oxygen to qualify for the Portable O2 tank.   Patient is wondering what to do.  Please Advise.

## 2020-12-26 NOTE — Telephone Encounter (Signed)
[place] clinic nurse was going to check on the status of this oxygen situation.  He needed the portable for when he was active.  Sats at rest have been normal.

## 2020-12-28 ENCOUNTER — Encounter: Payer: Self-pay | Admitting: Pulmonary Disease

## 2020-12-29 ENCOUNTER — Telehealth: Payer: Self-pay | Admitting: Pulmonary Disease

## 2020-12-29 ENCOUNTER — Telehealth: Payer: Self-pay | Admitting: *Deleted

## 2020-12-29 NOTE — Telephone Encounter (Signed)
Called and spoke with patient's daughter. She stated that her dad was able to see the office notes from 12/24/20. They were confused about the O2 order. When they called earlier, they were told that he needed to be on 6L and does not qualify for a POC based on 6L of O2. Patient stated that he has been using 2L of O2 with exertion.   I went over the order with both the patient and daughter on the phone. They do not remember him ever being on 6L despite it being documented during his walk. He also stated that he did not complete all 3 laps as documented.   The order for O2 that was placed was for 4-6L. He is concerned that this will cause him to not get the POC he needs.   The order was sent to [employer] on 12/24/20 but they have not heard anything yet. I advised them that I would call [employer] in the morning.   Dr. Vaughan Browner, can you please advise?

## 2020-12-29 NOTE — Telephone Encounter (Signed)
Autumn, [facility] Nurse, Called and stated that patient is requesting a Rx for 3 Small Oxygen Cylinders.   Nurse stated that patient is filling them himself and needs extra to use continuously when he leaves home.   Needs Rx faxed to [employer] Fax: [phone]  Please Advise.

## 2020-12-29 NOTE — Telephone Encounter (Signed)
Call returned to patient daughter, confirmed DOB. Made aware patient requires 6L and he is not eligible for a POC at that liter flow.   Patient daughter states she may have to have patient call back because has not been using 2L. I made her aware if they need to call back we can re-educate the patient on his oxygen requirements.   Voiced understanding.   Nothing further needed at this time.

## 2020-12-30 DIAGNOSIS — R5383 Other fatigue: Secondary | ICD-10-CM | POA: Diagnosis not present

## 2020-12-30 DIAGNOSIS — E291 Testicular hypofunction: Secondary | ICD-10-CM | POA: Diagnosis not present

## 2020-12-30 NOTE — Telephone Encounter (Signed)
pt's daughter returning a phone call. Pt s daughter can be reached at [phone]

## 2020-12-30 NOTE — Telephone Encounter (Signed)
Order was written and given to clinic nurse 3/15 to be faxed as requested.

## 2020-12-31 ENCOUNTER — Institutional Professional Consult (permissible substitution): Payer: PPO | Admitting: Pulmonary Disease

## 2020-12-31 ENCOUNTER — Other Ambulatory Visit: Payer: Self-pay | Admitting: Internal Medicine

## 2021-01-01 ENCOUNTER — Ambulatory Visit: Payer: PPO

## 2021-01-01 ENCOUNTER — Other Ambulatory Visit: Payer: Self-pay

## 2021-01-01 DIAGNOSIS — U099 Post covid-19 condition, unspecified: Secondary | ICD-10-CM

## 2021-01-01 DIAGNOSIS — R0609 Other forms of dyspnea: Secondary | ICD-10-CM

## 2021-01-01 NOTE — Telephone Encounter (Signed)
Received a call from patient and his daughter, [name].  Advised that the next time they are in the office to have them update the DPR to include his Daughter Santiago Glad so we can talk to her going forward.  They verbalized understanding.  Advised that POCs are all pulsed, not continuous oxygen and that they do not go to 6L.  Advised that Dr. Vaughan Browner had tried to call them, however, was unsuccessful and that he stated they could come in for another 6 minute walk to see if he could keep his sats above 88% on the POC.  Advised that per Dr. Vaughan Browner, he requires 6L of oxygen with exertion.  Patient states that while he is sitting, his sats are 95% on 2L and wanted to know if that was sufficient.  Advised that he wants to keep his sats above 88% and if he is sitting and his sats are 95% that is acceptable.  He sates when he exerts himself he uses 4-5 liters and his sats are 95% and wanted to know if that was acceptable.  Advised that again, as long as his sats are above 88% it is acceptable.  He did let me know that his current oxygen concentrator only goes up to 5L, so currently does not have the capability to use 6L.  Advised we could contact his DME company and get him a larger concentrator to allow him to use up to 6L and they would switch out the current concentrator with a new one that goes up to 10L.  He uses Social research officer, [employer]).  He also states he is going to need another small tank as well.  He will be taking a car trip that will be 4-5 hours and wants to make sure he has enough oxygen to last the length of the trip.  I advised them that the length the tank lasts depends on the liter flow, if he is at 2L it will last longer than if he is at 6L.  They verbalized understanding.  He determined that he wants to come in today to do a 6 minute walk and walk with the POC to see if he qualifies to use it.  I did advise them that since Covid, the POCs are hard to get as there is a high demand a not enough of them.   Will do the 6 minute walk and see what he needs after the walk.

## 2021-01-01 NOTE — Telephone Encounter (Signed)
I tired to call the daughter but got voice mail and requested call back.  He did need 6lt O2 to recover so did not qualify for portable concentrator. I believe they were told about it by the staff. We can offer to test him again to see if he qualifies for portable concentrator.

## 2021-01-04 ENCOUNTER — Telehealth: Payer: Self-pay | Admitting: Pulmonary Disease

## 2021-01-04 NOTE — Telephone Encounter (Signed)
LMTCB for Antonio [name]  

## 2021-01-05 NOTE — Telephone Encounter (Signed)
Called and spoke to [place] with [employer]. She states the pt's current oxygen rx has been changed. Pt is now needing 6lpm with activity but pt has a home O2 concentrator that only goes to 5lpm. Melissa states when the pt was called he refused the new concentrator.   Called and spoke to pt. Pt states he never outright refused he was only questioning why he needed a new one. Explained to the pt the need and answered his questions. Pt states he will call [employer] back about the concentrator. Will follow back up with pt.

## 2021-01-05 NOTE — Telephone Encounter (Signed)
Spoke with the pt  He spoke with [employer] and will accept the new concentrator that goes to 6lpm  He will reach out if this is not delivered soon  Nothing further needed

## 2021-01-05 NOTE — Telephone Encounter (Signed)
Pt returning a phone call. Pt can be reached at [phone].

## 2021-01-07 ENCOUNTER — Other Ambulatory Visit: Payer: PPO

## 2021-01-08 DIAGNOSIS — U099 Post covid-19 condition, unspecified: Secondary | ICD-10-CM

## 2021-01-08 DIAGNOSIS — R0609 Other forms of dyspnea: Secondary | ICD-10-CM

## 2021-01-08 NOTE — Telephone Encounter (Signed)
MR please advise on the pts questions below:  Unless I have exerted myself I don't need oxygen. I constantly monitor myself and make sure I am at 90 or above. I always have my cylinder with me and after any exertion I quickly monitor and if it is below 90 I watch for a few seconds and if it is going  up and back above 90  I don't connect to the oxygen. When this occurs the process takes 20 to 30 seconds.Is this acceptable? Probably happens five times a day max.

## 2021-01-08 NOTE — Telephone Encounter (Signed)
pls direct question to Dr Vaughan Browner. His patient

## 2021-01-11 NOTE — Telephone Encounter (Signed)
I called and spoke with patient. He has transient desats of <10 sec to 80s and recovers He states that his breathing is stable with no change  Please make referral to pulmonary rehab He is scheduled to get high-resolution CT next week and I will review it when completed

## 2021-01-11 NOTE — Telephone Encounter (Signed)
Pulmonary rehab order placed. Nothing further at this time.

## 2021-01-13 ENCOUNTER — Ambulatory Visit (INDEPENDENT_AMBULATORY_CARE_PROVIDER_SITE_OTHER)
Admission: RE | Admit: 2021-01-13 | Discharge: 2021-01-13 | Disposition: A | Discharge status: Home / Self Care | Payer: PPO | Source: Ambulatory Visit | Attending: Pulmonary Disease | Admitting: Pulmonary Disease

## 2021-01-13 ENCOUNTER — Other Ambulatory Visit: Payer: Self-pay

## 2021-01-13 DIAGNOSIS — R0600 Dyspnea, unspecified: Secondary | ICD-10-CM | POA: Diagnosis not present

## 2021-01-13 DIAGNOSIS — I2510 Atherosclerotic heart disease of native coronary artery without angina pectoris: Secondary | ICD-10-CM | POA: Diagnosis not present

## 2021-01-13 DIAGNOSIS — U099 Post covid-19 condition, unspecified: Secondary | ICD-10-CM

## 2021-01-13 DIAGNOSIS — R0609 Other forms of dyspnea: Secondary | ICD-10-CM | POA: Diagnosis not present

## 2021-01-13 DIAGNOSIS — U071 COVID-19: Secondary | ICD-10-CM | POA: Diagnosis not present

## 2021-01-13 DIAGNOSIS — J9811 Atelectasis: Secondary | ICD-10-CM | POA: Diagnosis not present

## 2021-01-14 ENCOUNTER — Encounter (HOSPITAL_COMMUNITY): Payer: Self-pay | Admitting: *Deleted

## 2021-01-14 NOTE — Progress Notes (Signed)
Received referral from Dr. 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  Pt diagnosed and admitted to the hospital on 10/18/20.  Pt discharged on 11/11/20 to rehab.  Pt discharged from rehab on 11/20/20.  Documentation noted on follow up appt with his PCP - Dr. Mariea Clonts with dyspnea and decreased O2 saturation.  Able to demonstrate a greater than 4 weeks continuance of persistent respiratory dysfunction.  Rush Landmark was referred to pulmonology. Clinical review of pt follow up appt on 3/10  Pulmonary office note.  Pt with Covid Risk Score - 7. Pt appropriate for scheduling for Pulmonary rehab.  Will forward to support staff for scheduling and verification of insurance eligibility/benefits with pt consent. Cherre Huger, BSN Cardiac and Training and development officer

## 2021-01-23 DIAGNOSIS — J9601 Acute respiratory failure with hypoxia: Secondary | ICD-10-CM | POA: Diagnosis not present

## 2021-01-25 DIAGNOSIS — Z85820 Personal history of malignant melanoma of skin: Secondary | ICD-10-CM | POA: Diagnosis not present

## 2021-01-25 DIAGNOSIS — L570 Actinic keratosis: Secondary | ICD-10-CM | POA: Diagnosis not present

## 2021-01-25 DIAGNOSIS — L814 Other melanin hyperpigmentation: Secondary | ICD-10-CM | POA: Diagnosis not present

## 2021-01-25 DIAGNOSIS — L718 Other rosacea: Secondary | ICD-10-CM | POA: Diagnosis not present

## 2021-01-25 DIAGNOSIS — C44519 Basal cell carcinoma of skin of other part of trunk: Secondary | ICD-10-CM | POA: Diagnosis not present

## 2021-01-25 DIAGNOSIS — Z85828 Personal history of other malignant neoplasm of skin: Secondary | ICD-10-CM | POA: Diagnosis not present

## 2021-01-25 DIAGNOSIS — D485 Neoplasm of uncertain behavior of skin: Secondary | ICD-10-CM | POA: Diagnosis not present

## 2021-01-25 DIAGNOSIS — C44612 Basal cell carcinoma of skin of right upper limb, including shoulder: Secondary | ICD-10-CM | POA: Diagnosis not present

## 2021-01-25 DIAGNOSIS — L821 Other seborrheic keratosis: Secondary | ICD-10-CM | POA: Diagnosis not present

## 2021-02-04 ENCOUNTER — Other Ambulatory Visit (HOSPITAL_COMMUNITY): Payer: PPO

## 2021-02-08 ENCOUNTER — Ambulatory Visit: Payer: PPO | Admitting: Pulmonary Disease

## 2021-02-17 NOTE — Progress Notes (Signed)
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 male who has been referred to [place] by Dr. Hollace Kinnier for evaluation and management of Bone lesion of left lower leg. The pt reports that he is doing well overall.   The pt reports that he cannot feel the ovoid finding from his 04/09/18 MRI as noted below. He denies being able to feel anything in his skin as well. He notes that he received a needle injection in his knee prior to this MRI. The pt notes that his knee has lost much cartilage and is considering a knee replacement. He denies any recent injuries to his knee or falls. He has contacted Dr. Alphonsa Overall in surgery and another surgeon as well and will be making an appointment.   The pt notes that he does not feel any differently recently as compared to 6 months to a year ago. He does note that he normally has constipation in the spring, and has continued to have mild constipation. He denies any other concerns or symptoms.   The pt previously saw my colleague Dr. Lurline Del for primary testicular B-cell lymphoma in 2000. The pt notes that the involvement was limited to only one testicle. He received 6 intrathecal treatments in his spine, and received 4 cycles of chemotherapy (thinks this was CHOP) and radiation as well. The pt was followed by an oncologist for several years at the [facility] in [place] after moving from [place] after his [place] treatment.   The pt notes that he was first diagnosed with melanoma in October 2000. He noticed a strange spot on his left leg that was surgically resected. The pt's personal notes report a 2.9cm thick, level 4, no LN involvement, high risk stage  II, treated with Interferon three times a week for one year. He notes some squamous cell and basal cell involvements as well and sees a dermatologist, Dr. Wilhemina Bonito, regularly.   Of note prior to the patient's visit today, pt has had MRI Left Knee completed on 04/09/18 with results revealing An ovoid, lobulated, 36m T2 hyperintense focus within the medial subcutaneous far 1.936mdistal to the joint demonstrates demonstrates nonspecific imaging characteristics.   Most recent lab results (03/01/18) of CBC w/diff is as follows: all values are WNL except for RBC at 4.54, MCV at 100.4, MCH at 33.8.   On review of systems, pt reports left knee pain, mild constipation, and denies new or concerning symptoms, noticing any new lumps or bumps, pain along the spine, abdominal pains, problems passing urine, testicular pain/swelling, and any other symptoms.   INTERVAL HISTORY:   WiHELIX LAFONTAINEeturns today for management and evaluation evaluation of Left Primary Testicular Large B-cell Lymphoma. The patient's last visit with [place] on 11/19/2020 The pt reports that he is doing well overall.  The pt reports that he has been gradually recovering from his very severe bout with COVID. The pt notes he is no longer on the oxygen. The pt notes he tested his breathing at different altitudes and noted some issues with exertion at 4000 feet and above. The pt notes that he has not flown due to the airlines telling him he cannot fly due to his  oxygen needs. The pt notes his current issue is the Pulmonologist is telling him that he is not recovering as well as expected due to his symptoms and exertion. The pt notes that he has been failing many of the tests he has been doing to determine if he needs oxygen at baseline or not. The pt notes that Medicare is saying he does not qualify for long term oxygen as they want him on continuous and not pulsing oxygen. The pt notes there are no good continuous portable oxygen machines, which is  very frustrating.  The pt notes that there is a bump around his [place] he has noticed, that the pt believes is strange. The pt notes that he [place] going through an hour of testing next week.  Lab results today 02/18/2021 of CBC w/diff and CMP is as follows: all values are WNL except for MCV of 101.8, Glucose of 178. 02/18/2021 LDH of 170.  On review of systems, pt reports recovering SOB and denies abdominal pain, chest pain, back pain, leg swelling, and any other symptoms.  MEDICAL HISTORY:  Past Medical History:  Diagnosis Date  . Age-related macular degeneration, dry, both eyes   . B-cell lymphoma [facility]) oncologist--- dr Irene Limbo   dx 2000 primary right testicular diffuse large b-cell lymphoma;  s/p  right orchiectomy 2000, completed chemo, intrathcal chemo (spine injection's), and radiation to left testis in 2000  . Carotid stenosis, asymptomatic, left ?  . ED (erectile dysfunction)   . Elevated diaphragm    right  . Erectile dysfunction   . History of basal cell carcinoma (BCC) excision    multiple excision in office  . History of kidney stones   . History of seizure    03-03-2020 per pt x1 while taking interferan for melanoma in 2001 ,  none since, told a side effect of interferan  . History of squamous cell carcinoma excision    multiple skin excision in office  . Hyperlipidemia   . Hypertension    followed by pcp  (03-03-2020 per pt had a stress test approx. 2010, told normal)  . LBBB (left bundle branch block)   . Lymphoma of testis ([place]) 2000   s/p right orchiectomy   . Mass of left testis   . MGUS (monoclonal gammopathy of unknown significance)    oncologist--- dr Irene Limbo  . Mixed hyperlipidemia 10/26/2020  . Personal history of malignant melanoma of skin dermatologist--- dr Wilhemina Bonito   dx 2001 left calf area  s/p WLE ,  completed one year interferan (03-03-2020 pt states did not involve lymph nodes, no recurrence)  . Urgency of urination   . Wears hearing aid in both ears      SURGICAL HISTORY: Past Surgical History:  Procedure Laterality Date  . CATARACT EXTRACTION W/ INTRAOCULAR LENS IMPLANT Bilateral 2018  . IR IMAGING GUIDED PORT INSERTION  03/31/2020  . MELANOMA EXCISION  10/1999  . ORCHIECTOMY Right 2000  . ORCHIECTOMY Left 03/05/2020   Procedure: ORCHIECTOMY;  Surgeon: [name], MD;  Location: [facility];  Service: Urology;  Laterality: Left;  . TONSILLECTOMY  child  . TOTAL KNEE ARTHROPLASTY Right 06/2009    SOCIAL HISTORY: Social History   Socioeconomic History  . Marital status: Married    Spouse name: Not on file  . Number of children: Not on file  . Years of education: Not on file  . Highest education level: Not on file  Occupational History  . Not on file  Tobacco Use  .  Smoking status: Never Smoker  . Smokeless tobacco: Never Used  Vaping Use  . Vaping Use: Never used  Substance and Sexual Activity  . Alcohol use: Yes    Alcohol/week: 14.0 standard drinks    Types: 14 Glasses of wine per week    Comment: 2 wine daily  . Drug use: Never  . Sexual activity: Not on file  Other Topics Concern  . Not on file  Social History Narrative   Social History      Diet?good      Do you drink/eat things with caffeine? seldom      Marital status?          yes                          What year were you married? 1962      Do you live in a house, apartment, assisted living, condo, trailer, etc.? house      Is it one or more stories?1      How many persons live in your home? 2      Do you have any pets in your home? (please list) no      Highest level of education completed? college      Current or past profession: president and [employer]      Do you exercise?   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 yes      Do you have a DNR form?       yes                            If not, do you want to discuss one?       Do you have signed POA/HPOA for forms?  yes      Functional Status      Do you have difficulty bathing or dressing yourself? no      Do you have difficulty preparing food or eating? no      Do you have difficulty managing your medications? no      Do you have difficulty managing your finances? no      Do you have difficulty affording your medications? no   Social Determinants of Health   Financial Resource Strain: Not on file  Food Insecurity: Not on file  Transportation Needs: Not on file  Physical Activity: Not on file  Stress: Not on file  Social Connections: Not on file  Intimate Partner Violence: Not on file    FAMILY HISTORY: Family History  Problem Relation Age of Onset  . Cataracts Mother   . Transient ischemic attack Mother   . Asthma Mother   . Cataracts Father   . Diabetes Father        borderline  . Hypertension Father   . Hyperlipidemia Father   . Hyperlipidemia Son   . Hypertension Son   . Amblyopia Neg Hx   . Blindness Neg Hx   . Glaucoma Neg Hx   . Macular degeneration Neg Hx   . Retinal detachment Neg Hx   . Strabismus Neg Hx   .  Retinitis pigmentosa Neg Hx     ALLERGIES:  is allergic to latex.  MEDICATIONS:  Current Outpatient Medications  Medication Sig Dispense Refill  . albuterol (VENTOLIN HFA) 108 (90 Base) MCG/ACT inhaler Inhale 2 puffs into the lungs every 6 (six) hours as needed for shortness of breath. 8 g 2  . benzonatate (TESSALON) 100 MG capsule Take 1 capsule (100 mg total) by mouth 3 (three) times daily as needed for cough. 60 capsule 0  . Carboxymethylcellul-Glycerin (REFRESH RELIEVA OP) One drop both eyes daily as needed for itchy eyes.    . fluticasone (FLONASE) 50 MCG/ACT nasal spray Place 2 sprays into both nostrils daily. 11.1 mL 5  . lactose free nutrition (BOOST) LIQD Take 237 mLs by mouth 3 (three) times daily between meals.    Marland Kitchen losartan (COZAAR) 100 MG tablet TAKE 1 TABLET BY MOUTH EVERY DAY 90 tablet 1  .  meloxicam (MOBIC) 7.5 MG tablet Take 1 tablet (7.5 mg total) by mouth daily. 90 tablet 3  . metoprolol tartrate (LOPRESSOR) 25 MG tablet Take 0.5 tablets (12.5 mg total) by mouth 2 (two) times daily. 60 tablet 3  . Multiple Vitamins-Minerals (PRESERVISION AREDS 2 PO) Take 1 capsule by mouth 2 (two) times daily.     . simvastatin (ZOCOR) 40 MG tablet TAKE 1 TABLET BY MOUTH EVERY DAY 90 tablet 0  . Testosterone 20.25 MG/ACT (1.62%) GEL Apply 40.5 mg topically daily.    . valACYclovir (VALTREX) 1000 MG tablet Take 1000 (1 grams) PO 2 times daily for 3 days as needed for flares (herpes)    . azithromycin (ZITHROMAX Z-PAK) 250 MG tablet Take 2 tabs today, then 1 tab until gone (Patient not taking: Reported on 02/18/2021) 6 each 0  . Cholecalciferol 100 MCG (4000 UT) CAPS Take 1 capsule (4,000 Units total) by mouth daily. (Patient not taking: Reported on 02/18/2021) 14 capsule 0  . lactulose (CHRONULAC) 10 GM/15ML solution Take 15 mLs (10 g total) by mouth daily. (Patient not taking: No sig reported) 236 mL 0  . melatonin 5 MG TABS Take 5 mg by mouth. (Patient not taking: Reported on 02/18/2021)    . mupirocin 2% oint-hydrocortisone 2.5% cream-nystatin cream-zinc oxide 13% oint 1:1:1:5 mixture Apply topically 2 (two) times daily as needed for rash, itching or irritation. (Patient not taking: Reported on 02/18/2021) 120 g 3  . nystatin (NYSTATIN) powder Apply 1 application topically 4 (four) times daily. Apply to groin area for yeast infection until resolved (Patient not taking: Reported on 02/18/2021) 60 g 3  . polyethylene glycol (MIRALAX / GLYCOLAX) 17 g packet Take 17 g by mouth daily. (Patient not taking: No sig reported) 14 each 0  . predniSONE (DELTASONE) 10 MG tablet Take 4 tablets (40 mg total) by mouth daily with breakfast. (Patient not taking: Reported on 02/18/2021) 28 tablet 0  . senna-docusate (SENOKOT-S) 8.6-50 MG tablet Take 1 tablet by mouth 2 (two) times daily. (Patient not taking: Reported on 02/18/2021)  60 tablet 0   No current facility-administered medications for this visit.    REVIEW OF SYSTEMS:   10 Point review of Systems was done is negative except as noted above.  PHYSICAL EXAMINATION: ECOG PERFORMANCE STATUS: 0 - Asymptomatic VS reviewed - stable  Vitals:   02/18/21 1357  BP: (!) 145/86  Pulse: 78  Resp: 16  Temp: 97.8 F (36.6 C)  SpO2: 94%   Wt Readings from Last 3 Encounters:  02/18/21 195 lb (88.5 kg)  12/24/20 189 lb 3.2 oz (  85.8 kg)  12/16/20 192 lb (87.1 kg)   Body mass index is 26.45 kg/m.    Exam was given in a chair. GENERAL:alert, in no acute distress and comfortable SKIN: no acute rashes, no significant lesions EYES: conjunctiva are pink and non-injected, sclera anicteric OROPHARYNX: MMM, no exudates, no oropharyngeal erythema or ulceration NECK: supple, no JVD LYMPH:  no palpable lymphadenopathy in the cervical, axillary or inguinal regions LUNGS: clear to auscultation b/l with normal respiratory effort HEART: regular rate & rhythm ABDOMEN:  normoactive bowel sounds , non tender, not distended. Extremity: no pedal edema PSYCH: alert & oriented x 3 with fluent speech NEURO: no focal motor/sensory deficits  LABORATORY DATA:  I have reviewed the data as listed  . CBC Latest Ref Rng & Units 02/18/2021 11/19/2020 11/17/2020  WBC 4.0 - 10.5 K/uL 7.5 7.8 6.4  Hemoglobin 13.0 - 17.0 g/dL 15.0 12.7(L) 13.5  Hematocrit 39.0 - 52.0 % 46.1 40.1 39(A)  Platelets 150 - 400 K/uL 200 170 152   . CBC    Component Value Date/Time   WBC 7.5 02/18/2021 1328   RBC 4.53 02/18/2021 1328   HGB 15.0 02/18/2021 1328   HGB 12.7 (L) 11/19/2020 1323   HCT 46.1 02/18/2021 1328   HCT 40.8 12/19/2019 1057   PLT 200 02/18/2021 1328   PLT 170 11/19/2020 1323   MCV 101.8 (H) 02/18/2021 1328   MCH 33.1 02/18/2021 1328   MCHC 32.5 02/18/2021 1328   RDW 13.1 02/18/2021 1328   LYMPHSABS 1.5 02/18/2021 1328   MONOABS 0.9 02/18/2021 1328   EOSABS 0.1 02/18/2021 1328    BASOSABS 0.1 02/18/2021 1328    . CMP Latest Ref Rng & Units 02/18/2021 11/19/2020 11/17/2020  Glucose 70 - 99 mg/dL 178(H) 100(H) -  BUN 8 - 23 mg/dL _0 Creatinine 0.61 - 1.24 mg/dL 0.91 0.83 0.7  Sodium 135 - 145 mmol/L 141 141 141  Potassium 3.5 - 5.1 mmol/L 4.9 3.9 4.0  Chloride 98 - 111 mmol/L 103 104 102  CO2 22 - 32 mmol/L 31 29 29(A)  Calcium 8.9 - 10.3 mg/dL 9.2 8.7(L) 8.7  Total Protein 6.5 - 8.1 g/dL 6.5 6.2(L) -  Total Bilirubin 0.3 - 1.2 mg/dL 0.5 0.6 -  Alkaline Phos 38 - 126 U/L 69 54 -  AST 15 - 41 U/L 29 16 -  ALT 0 - 44 U/L 33 28 -   03/05/2020 Left Testicular Flow Pathology Report ([id number]):    03/05/2020 FISH Panel:    03/05/2020 Left Testicular Mass Surgical Pathology ([id number]):    Component     Latest Ref Rng & Units 12/19/2019  Folate, Hemolysate     Not Estab. ng/mL 390.0  HCT     37.5 - 51.0 % 40.8  Folate, RBC     >498 ng/mL 956  Vitamin B12     180 - 914 pg/mL 882   03/01/18 CBC w/diff:   RADIOGRAPHIC STUDIES: I have personally reviewed the radiological images as listed and agreed with the findings in the report. No results found.  03/05/20 of Surgical Pathology ([id number]) SURGICAL PATHOLOGY  CASE: [id number]  PATIENT: [name]  Surgical Pathology Report      Clinical History: Left testicular mass (jmc)      FINAL MICROSCOPIC DIAGNOSIS:   A. TESTICLE AND CORD, LEFT, ORCHIECTOMY:  - Diffuse large B-cell lymphoma  -See comment    COMMENT:   The sections of the testis and spermatic cord nodules show effacement of  the architecture by an atypical lymphoid infiltrate characterized by  predominance of medium and large lymphoid cells with vesicular chromatin  and small nucleoli associated with brisk mitosis. The appearance is  primarily diffuse with lack of atypical follicles. Variable number of  admixed smaller lymphocytes are seen. To further evaluate this process,  flow cytometric analysis was  performed ([id number]) and shows a  monoclonal kappa-restricted B-cell population. In addition, a battery  of immunohistochemical stains was performed and shows that the atypical  lymphoid cells are positive for CD20, CD79a, PAX 5, CD10 (weak), BCL-2,  MUM-1 and cytoplasmic kappa. No significant staining is seen with  CD138, cyclin D1, CD30, CD34, TdT, EBV in situ hybridization or  cytoplasmic lambda. Ki67 shows variable expression ranging from 10% to  >50%. There is an admixed T-cell population to a lesser extent as seen  with CD3 and CD5 and there is no apparent co-expression of CD5 in B-cell  areas. The overall features are consistent with involvement by diffuse  large B-cell lymphoma, GCB type. The results were discussed with Dr.  Jeffie Pollock and Irene Limbo on 03/11/2020.    ASSESSMENT & PLAN:   80 y.o. male with  1. Ovoid lesion near the medial aspect of the left knee ? Injection granuloma vs other etiology for lesion. however given previous h/o melanoma and lymphoma will need to be cautious and try to wotrk this up.  04/09/18 MRI of Left Knee revealed An ovoid, lobulated, 90m T2 hyperintense focus within the medial subcutaneous far 1.986mdistal to the joint demonstrates demonstrates nonspecific imaging characteristics.   05/17/18 [place] Soft Tissue Biopsy of the Left Knee did not identify any malignant cells and revealed inflammatory cells.  2. MGUS- IgM kappa monoclonal paraproteinemia 05/02/18 MMP revealed M Protein at 0.5 with immunofixation revealing IgM with kappa light chains  3/ h/o Melanoma   4. H/o Rt Primary testicular large B cell lymphoma in 2000 treatment with R-CHOP x 6 cycles as part of clinical trial + IT MTX x 4 cycles and RT to left testicle  5. 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 Appears to be a new event since it is occurring 20 yrs after his previous rt testicular lymphoma S/p 6 cycles of R-CEOP -- currently in  remission.   PLAN: -Discussed pt labwork today, 02/18/2021; blood counts and chemistries normal, LDH normal. -Recommended pt receive the second COVID booster shot as recently approved. The pt notes he has already received this. -Discussed Evusheld and pt's eligibility. Will send referral. -No lab or clinical evidence of Large B-cell Lymphoma recurrence at this time.  -Recommended that the pt continue to eat well, drink at least 48-64 oz of water each day, and walk 20-30 minutes each day.  -Will see back in 4 months with labs.   FOLLOW UP: RTC with Dr KaIrene Limboith labs in 3 months   The total time spent in the appointment was 20 minutes and more than 50% was on counseling and direct patient cares.  All of the patient's questions were answered with apparent satisfaction. The patient knows to call the clinic with any problems, questions or concerns.    GaSullivan LoneD MSCoultervilleAHIVMS SCMedical Park Tower Surgery CenterTBryan W. Whitfield Memorial Hospitalematology/Oncology Physician CoHudson County Meadow[facility](Office):       [phone] cell):  [phone]):           [phone]/02/2021 2:36 PM  I, RoReinaldo Raddleam acting as scribe for Dr. GaSullivan LoneMD.  .  I have reviewed the above documentation for accuracy and completeness, and I agree with the above. Brunetta Genera MD

## 2021-02-18 ENCOUNTER — Inpatient Hospital Stay: Payer: PPO | Admitting: Hematology

## 2021-02-18 ENCOUNTER — Other Ambulatory Visit: Payer: Self-pay

## 2021-02-18 ENCOUNTER — Inpatient Hospital Stay: Payer: PPO | Attending: Hematology

## 2021-02-18 VITALS — BP 145/86 | HR 78 | Temp 97.8°F | Resp 16 | Ht 72.0 in | Wt 195.0 lb

## 2021-02-18 DIAGNOSIS — Z9221 Personal history of antineoplastic chemotherapy: Secondary | ICD-10-CM | POA: Insufficient documentation

## 2021-02-18 DIAGNOSIS — M2580 Other specified joint disorders, unspecified joint: Secondary | ICD-10-CM | POA: Diagnosis not present

## 2021-02-18 DIAGNOSIS — C8339 Diffuse large B-cell lymphoma, extranodal and solid organ sites: Secondary | ICD-10-CM | POA: Insufficient documentation

## 2021-02-18 DIAGNOSIS — Z5111 Encounter for antineoplastic chemotherapy: Secondary | ICD-10-CM

## 2021-02-18 DIAGNOSIS — Z85820 Personal history of malignant melanoma of skin: Secondary | ICD-10-CM | POA: Diagnosis not present

## 2021-02-18 LAB — CMP (CANCER CENTER ONLY)
ALT: 33 U/L (ref 0–44)
AST: 29 U/L (ref 15–41)
Albumin: 4 g/dL (ref 3.5–5.0)
Alkaline Phosphatase: 69 U/L (ref 38–126)
Anion gap: 7 (ref 5–15)
BUN: 16 mg/dL (ref 8–23)
CO2: 31 mmol/L (ref 22–32)
Calcium: 9.2 mg/dL (ref 8.9–10.3)
Chloride: 103 mmol/L (ref 98–111)
Creatinine: 0.91 mg/dL (ref 0.61–1.24)
GFR, Estimated: >60 mL/min (ref >60)
Glucose, Bld: 178 mg/dL — ABNORMAL HIGH (ref 70–99)
Potassium: 4.9 mmol/L (ref 3.5–5.1)
Sodium: 141 mmol/L (ref 135–145)
Total Bilirubin: 0.5 mg/dL (ref 0.3–1.2)
Total Protein: 6.5 g/dL (ref 6.5–8.1)

## 2021-02-18 LAB — CBC WITH DIFFERENTIAL/PLATELET
Abs Immature Granulocytes: 0.02 10*3/uL (ref 0.00–0.07)
Basophils Absolute: 0.1 10*3/uL (ref 0.0–0.1)
Basophils Relative: 1 %
Eosinophils Absolute: 0.1 10*3/uL (ref 0.0–0.5)
Eosinophils Relative: 1 %
HCT: 46.1 % (ref 39.0–52.0)
Hemoglobin: 15 g/dL (ref 13.0–17.0)
Immature Granulocytes: 0 %
Lymphocytes Relative: 19 %
Lymphs Abs: 1.5 10*3/uL (ref 0.7–4.0)
MCH: 33.1 pg (ref 26.0–34.0)
MCHC: 32.5 g/dL (ref 30.0–36.0)
MCV: 101.8 fL — ABNORMAL HIGH (ref 80.0–100.0)
Monocytes Absolute: 0.9 10*3/uL (ref 0.1–1.0)
Monocytes Relative: 11 %
Neutro Abs: 5.1 10*3/uL (ref 1.7–7.7)
Neutrophils Relative %: 68 %
Platelets: 200 10*3/uL (ref 150–400)
RBC: 4.53 MIL/uL (ref 4.22–5.81)
RDW: 13.1 % (ref 11.5–15.5)
WBC: 7.5 10*3/uL (ref 4.0–10.5)
nRBC: 0 % (ref 0.0–0.2)

## 2021-02-18 LAB — LACTATE DEHYDROGENASE: LDH: 170 U/L (ref 98–192)

## 2021-02-19 ENCOUNTER — Telehealth: Payer: Self-pay | Admitting: Hematology

## 2021-02-19 NOTE — Telephone Encounter (Signed)
Scheduled follow-up appointment per 5/5 los. Patient is aware. ?

## 2021-02-22 ENCOUNTER — Telehealth: Payer: Self-pay | Admitting: Adult Health

## 2021-02-22 DIAGNOSIS — J9601 Acute respiratory failure with hypoxia: Secondary | ICD-10-CM | POA: Diagnosis not present

## 2021-02-22 NOTE — Telephone Encounter (Signed)
Reviewed referral from Dr. Irene Limbo for patient to receive Evusheld.  I sent a message inquiring for additional information, once received, I will call the patient and review.    Wilber Bihari, NP

## 2021-03-01 ENCOUNTER — Other Ambulatory Visit (HOSPITAL_COMMUNITY)
Admission: RE | Admit: 2021-03-01 | Discharge: 2021-03-01 | Disposition: A | Discharge status: Home / Self Care | Payer: PPO | Source: Ambulatory Visit | Attending: Pulmonary Disease | Admitting: Pulmonary Disease

## 2021-03-01 DIAGNOSIS — Z01812 Encounter for preprocedural laboratory examination: Secondary | ICD-10-CM | POA: Diagnosis not present

## 2021-03-01 DIAGNOSIS — U071 COVID-19: Secondary | ICD-10-CM | POA: Diagnosis not present

## 2021-03-01 LAB — SARS CORONAVIRUS 2 (TAT 6-24 HRS): SARS Coronavirus 2: POSITIVE — AB

## 2021-03-02 ENCOUNTER — Telehealth (HOSPITAL_COMMUNITY): Payer: Self-pay

## 2021-03-02 ENCOUNTER — Other Ambulatory Visit: Payer: Self-pay

## 2021-03-02 ENCOUNTER — Telehealth: Payer: Self-pay

## 2021-03-02 DIAGNOSIS — R0609 Other forms of dyspnea: Secondary | ICD-10-CM

## 2021-03-02 DIAGNOSIS — U099 Post covid-19 condition, unspecified: Secondary | ICD-10-CM

## 2021-03-02 NOTE — Telephone Encounter (Signed)
03/02/21 - Contacted surgeon - advised of patient's Positive Covid19 lab results. MBM

## 2021-03-02 NOTE — Telephone Encounter (Signed)
Called to discuss with patient about COVID-19 symptoms and the use of one of the available treatments for those with mild to moderate Covid symptoms and at a high risk of hospitalization.  Pt appears to qualify for outpatient treatment due to co-morbid conditions and/or a member of an at-risk group in accordance with the FDA Emergency Use Authorization.    Symptom onset: Unknown Vaccinated: Yes Booster? Yes Immunocompromised? No Qualifiers: HTN, Respiratory failure NIH Criteria: Tier 1  Unable to reach pt - Left message and call back number [phone].   Marcello Moores

## 2021-03-03 ENCOUNTER — Encounter: Payer: Self-pay | Admitting: Pulmonary Disease

## 2021-03-03 ENCOUNTER — Telehealth (HOSPITAL_COMMUNITY): Payer: Self-pay

## 2021-03-03 ENCOUNTER — Other Ambulatory Visit: Payer: Self-pay

## 2021-03-03 ENCOUNTER — Ambulatory Visit (INDEPENDENT_AMBULATORY_CARE_PROVIDER_SITE_OTHER): Payer: PPO | Admitting: Pulmonary Disease

## 2021-03-03 DIAGNOSIS — U099 Post covid-19 condition, unspecified: Secondary | ICD-10-CM | POA: Diagnosis not present

## 2021-03-03 DIAGNOSIS — R0609 Other forms of dyspnea: Secondary | ICD-10-CM

## 2021-03-03 NOTE — Telephone Encounter (Signed)
Pt insurance is active and benefits verified through [employer]. Co-pay $15.00, DED $0.00/$0.00 met, out of pocket $3,450.00/$3,908.40 met, co-insurance 0%. No pre-authorization required. Neal/HTA, 03/03/21 @ 12:21PM, LTE#[id number]  Will contact patient to see if he is interested in the Pulmonary Rehab Program.

## 2021-03-03 NOTE — Progress Notes (Signed)
Antonio [name]    [id number]    11/02/1940  Primary Care Physician:Gupta, Rene Kocher, MD  Referring Physician: [name], DO [place],  [place] [id number]   Virtual Visit via Telephone Note  I connected with Antonio [name] on 03/03/21 at 10:00 AM EDT by telephone and verified that I am speaking with the correct person using two identifiers.  Location: Patient: Home Provider: [facility], [place], [place]   I discussed the limitations, risks, security and privacy concerns of performing an evaluation and management service by telephone and the availability of in person appointments. I also discussed with the patient that there may be a patient responsible charge related to this service. The patient expressed understanding and agreed to proceed.   Chief complaint:  Follow up post Covid syndrome  HPI: 80 year old with history of hypertension, recurrent testicular large B-cell lymphoma Hospitalized for COVID-19 in January 2022.  He was immunized and boosted against COVID Treated with remdesivir, prednisone.  Hospital course complicated by SVT, diastolic heart failure.  He went to rehab till mid February.  Weaned off oxygen before being sent home. He was on a Pred taper till mid February.  Complains of chronic dyspnea on exertion.  Reports slightly increased dyspnea with congestion over the past week with O2 sats dropping to the mid 80s.  He also has post Covid cognitive issues and forgetfulness. Denies any fevers, chills.  He has history of large B-cell lymphoma in 2009 recurrence in 2021.  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  No significant recent travel Relevant family history: No family history of lung  disease  Interim history: At last visit he was given Z-Pak and prednisone for post-COVID pneumonitis, started on supplemental oxygen.  Overall he is much improved and feels back to baseline.  He has weaned himself off oxygen and states that he only needs it [place].  Reports that he had some minor cold episode 10 days ago that resolved.  No fevers or chills.  Denies any cough or dyspnea.  He was supposed to get PFTs today but his preprocedure COVID testing was positive and hence it was canceled and clinic visit changed to virtual.  Outpatient Encounter Medications as of 03/03/2021  Medication Sig  . albuterol (VENTOLIN HFA) 108 (90 Base) MCG/ACT inhaler Inhale 2 puffs into the lungs every 6 (six) hours as needed for shortness of breath.  Marland Kitchen azithromycin (ZITHROMAX Z-PAK) 250 MG tablet Take 2 tabs today, then 1 tab until gone  . benzonatate (TESSALON) 100 MG capsule Take 1 capsule (100 mg total) by mouth 3 (three) times daily as needed for cough.  . Carboxymethylcellul-Glycerin (REFRESH RELIEVA OP) One drop both eyes daily as needed for itchy eyes.  . Cholecalciferol 100 MCG (4000 UT) CAPS Take 1 capsule (4,000 Units total) by mouth daily.  . fluticasone (FLONASE) 50 MCG/ACT nasal spray Place 2 sprays into both nostrils daily.  Marland Kitchen lactose free nutrition (BOOST) LIQD Take 237 mLs by mouth 3 (three) times daily between meals.  . lactulose (CHRONULAC) 10 GM/15ML solution Take 15 mLs (10 g total) by mouth daily.  Marland Kitchen losartan (COZAAR) 100 MG tablet TAKE 1 TABLET BY MOUTH EVERY DAY  . melatonin 5 MG  TABS Take 5 mg by mouth.  . meloxicam (MOBIC) 7.5 MG tablet Take 1 tablet (7.5 mg total) by mouth daily.  . metoprolol tartrate (LOPRESSOR) 25 MG tablet Take 0.5 tablets (12.5 mg total) by mouth 2 (two) times daily.  . Multiple Vitamins-Minerals (PRESERVISION AREDS 2 PO) Take 1 capsule by mouth 2 (two) times daily.   . mupirocin 2% oint-hydrocortisone 2.5% cream-nystatin cream-zinc  oxide 13% oint 1:1:1:5 mixture Apply topically 2 (two) times daily as needed for rash, itching or irritation.  Marland Kitchen nystatin (NYSTATIN) powder Apply 1 application topically 4 (four) times daily. Apply to groin area for yeast infection until resolved  . polyethylene glycol (MIRALAX / GLYCOLAX) 17 g packet Take 17 g by mouth daily.  . predniSONE (DELTASONE) 10 MG tablet Take 4 tablets (40 mg total) by mouth daily with breakfast.  . senna-docusate (SENOKOT-S) 8.6-50 MG tablet Take 1 tablet by mouth 2 (two) times daily.  . simvastatin (ZOCOR) 40 MG tablet TAKE 1 TABLET BY MOUTH EVERY DAY  . Testosterone 20.25 MG/ACT (1.62%) GEL Apply 40.5 mg topically daily.  . valACYclovir (VALTREX) 1000 MG tablet Take 1000 (1 grams) PO 2 times daily for 3 days as needed for flares (herpes)   No facility-administered encounter medications on file as of 03/03/2021.    Allergies as of 03/03/2021 - Review Complete 03/03/2021  Allergen Reaction Noted  . Latex Itching 03/12/2020   Physical Exam: Blood pressure 116/70, pulse 76, temperature (!) 97.2 F (36.2 C), temperature source Temporal, height 6' (1.829 m), weight 189 lb 3.2 oz (85.8 kg), SpO2 94 %. Gen:      No acute distress HEENT:  EOMI, sclera anicteric Neck:     No masses; no thyromegaly Lungs:    Clear to auscultation bilaterally; normal respiratory effort CV:         Regular rate and rhythm; no murmurs Abd:      + bowel sounds; soft, non-tender; no palpable masses, no distension Ext:    No edema; adequate peripheral perfusion Skin:      Warm and dry; no rash Neuro: alert and oriented x 3 Psych: normal mood and affect  Data Reviewed: Imaging: CT chest 10/20/2020-multifocal infiltrates consistent with pneumonia CTA 10/26/2020-no PE, multifocal infiltrates.   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  I not sure if he ever cleared his virus or got reinfected He may be considered immunocompromised due to chemotherapy for lymphoma total last dose was in 2021  He has been referred for Evusheld for COVID prophylaxis but given positive COVID PCR he may be a candidate for Bebtelovimab.  Plan/Recommendations: Check COVID IgG.  If negative then discuss with pharmacy about Bebtelovimab  I discussed the assessment and treatment plan with the patient. The patient was provided an opportunity to ask questions and all were answered. The patient agreed with the plan and demonstrated an understanding of the instructions.   The patient was advised to call back or seek an in-person evaluation if the symptoms worsen or if the condition fails to improve as anticipated.  I provided 25 minutes of non-face-to-face time during this encounter.  Marshell Garfinkel MD [facility] 03/03/2021, 10:06 AM  CC:  Reed, Tiffany L, DO

## 2021-03-03 NOTE — Addendum Note (Signed)
Addended by: Gavin Potters R on: 03/03/2021 10:42 AM   Modules accepted: Orders

## 2021-03-03 NOTE — Patient Instructions (Signed)
We will check a COVID IgG.  I will be in touch with you with the results and may make a referral for antibody infusion

## 2021-03-03 NOTE — Telephone Encounter (Signed)
Called and spoke with pt in regards to PR, pt stated he is not interested at this time.   Closed referral 

## 2021-03-03 NOTE — Telephone Encounter (Signed)
Pt insurance is active and benefits verified through [employer]. Co-pay $15.00, DED $0.00/$0.00 met, out of pocket $3,450.00/$3,908.40 met, co-insurance 0%. No pre-authorization required. Neal/[employer], 03/03/21 @ 12:21PM, FXT#[id number]  Will contact patient to see if he is interested in the Pulmonary Rehab Program.

## 2021-03-09 ENCOUNTER — Telehealth: Payer: Self-pay

## 2021-03-09 IMAGING — DX DG CHEST 1V PORT
1 series · 1 of 1 positions shown · non-contrast
Comparison: None.

CLINICAL DATA: Recent COVID positive, new shortness of breath

EXAM:
PORTABLE CHEST 1 VIEW

[chest ap]
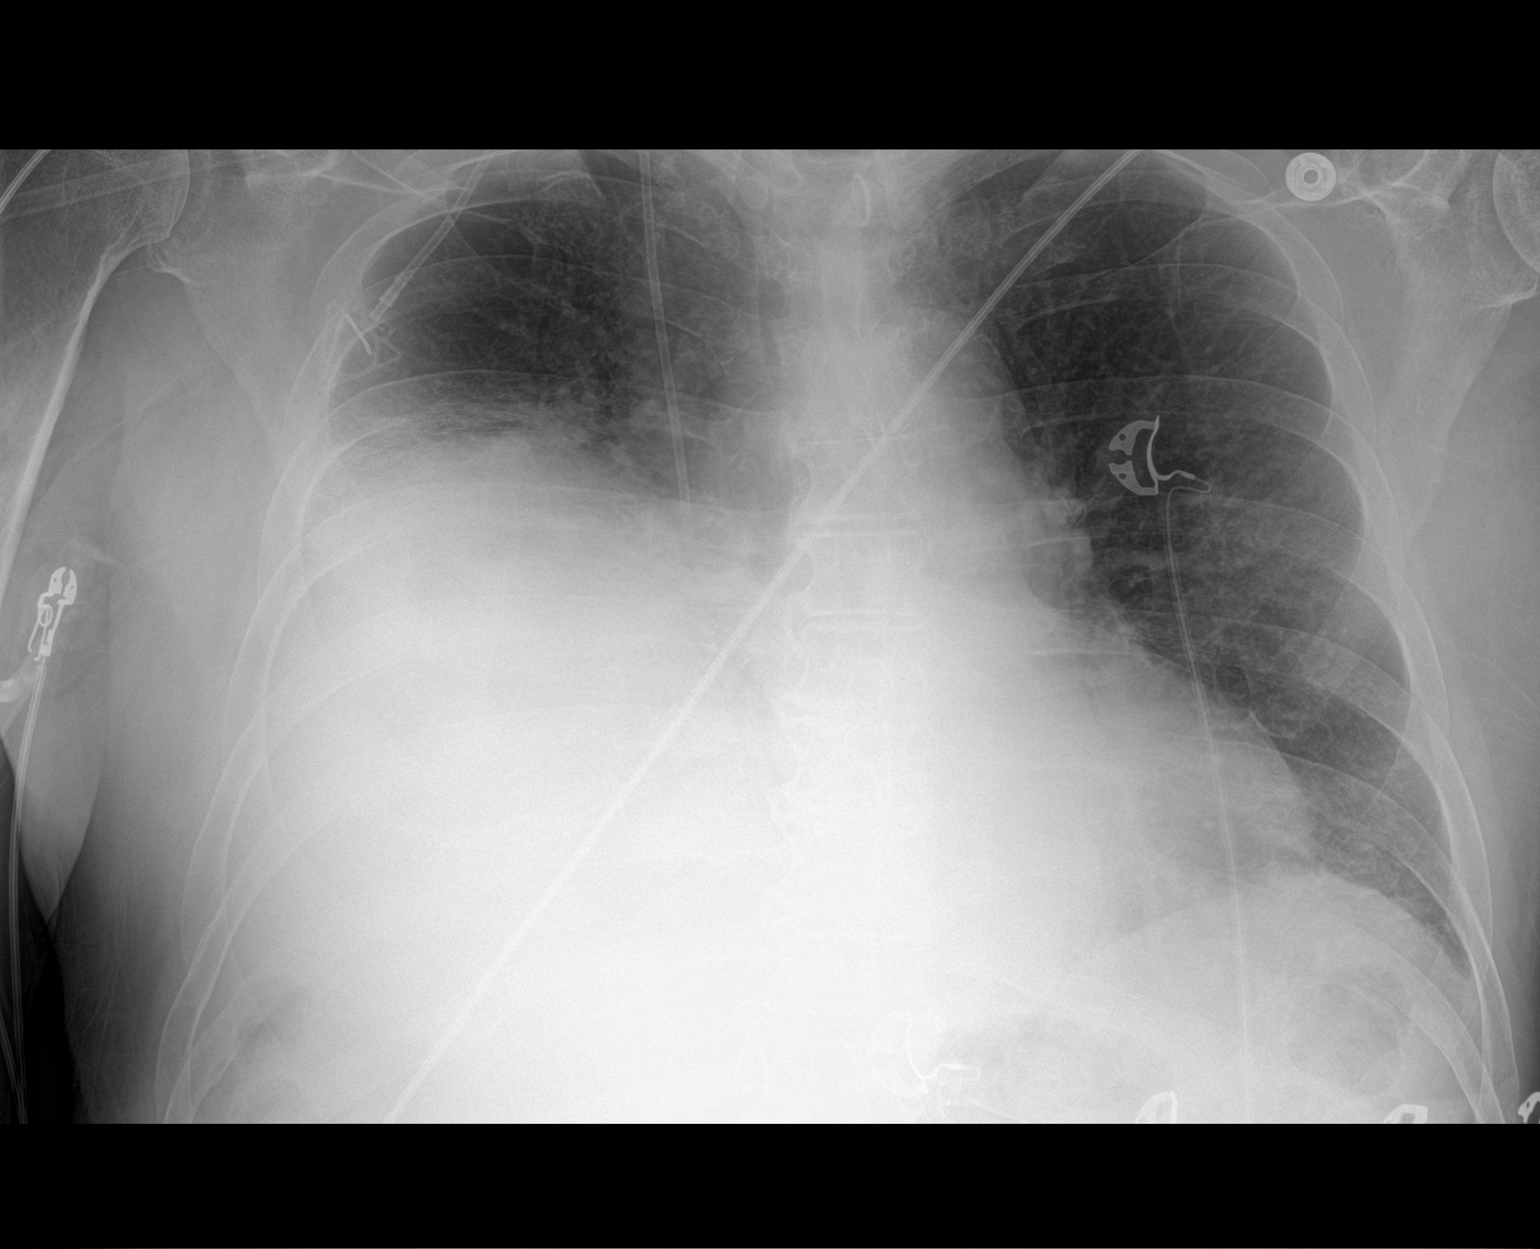

[1 of 1 positions shown; findings below may reference images not displayed]

FINDINGS: Right chest wall port catheter tip overlies SVC. Opacification of
the right mid and lower lung likely reflects combination of moderate
pleural effusion and atelectasis. Patchy interstitial prominence in
the remainder of the right lung as well as the left lung. No
pneumothorax. Cardiomediastinal contours are partially obscured but
otherwise unremarkable.
IMPRESSION: Probable moderate pleural effusion and associated atelectasis.
Patchy interstitial prominence in the remainder of the lungs may
reflect edema or pneumonia.

## 2021-03-09 NOTE — Telephone Encounter (Signed)
Refill request received from [facility] for propanolol 10mg  tablet. Medication not on medication list. Please advise whether ok to fill. Medication pended and routed to Royal Hawthorn, NP for appoval.

## 2021-03-10 ENCOUNTER — Encounter: Payer: Self-pay | Admitting: Internal Medicine

## 2021-03-10 ENCOUNTER — Other Ambulatory Visit: Payer: PPO

## 2021-03-10 ENCOUNTER — Other Ambulatory Visit: Payer: Self-pay

## 2021-03-10 ENCOUNTER — Non-Acute Institutional Stay: Payer: PPO | Admitting: Internal Medicine

## 2021-03-10 VITALS — BP 144/102 | HR 70 | Temp 98.1°F | Ht 72.0 in | Wt 193.2 lb

## 2021-03-10 DIAGNOSIS — M542 Cervicalgia: Secondary | ICD-10-CM

## 2021-03-10 DIAGNOSIS — I471 Supraventricular tachycardia, unspecified: Secondary | ICD-10-CM

## 2021-03-10 DIAGNOSIS — C8339 Diffuse large B-cell lymphoma, extranodal and solid organ sites: Secondary | ICD-10-CM | POA: Diagnosis not present

## 2021-03-10 DIAGNOSIS — Z7189 Other specified counseling: Secondary | ICD-10-CM | POA: Diagnosis not present

## 2021-03-10 DIAGNOSIS — C83398 Diffuse large b-cell lymphoma of other extranodal and solid organ sites: Secondary | ICD-10-CM

## 2021-03-10 DIAGNOSIS — U099 Post covid-19 condition, unspecified: Secondary | ICD-10-CM

## 2021-03-10 DIAGNOSIS — E349 Endocrine disorder, unspecified: Secondary | ICD-10-CM | POA: Diagnosis not present

## 2021-03-10 DIAGNOSIS — I10 Essential (primary) hypertension: Secondary | ICD-10-CM | POA: Diagnosis not present

## 2021-03-10 DIAGNOSIS — R0609 Other forms of dyspnea: Secondary | ICD-10-CM

## 2021-03-10 DIAGNOSIS — G250 Essential tremor: Secondary | ICD-10-CM | POA: Diagnosis not present

## 2021-03-10 DIAGNOSIS — E7800 Pure hypercholesterolemia, unspecified: Secondary | ICD-10-CM

## 2021-03-10 NOTE — Progress Notes (Addendum)
Location:  [place] of Service:  Clinic (12)  Provider:   Code Status: DNR Goals of Care:  Advanced Directives 12/02/2020  Does Patient Have a Medical Advance Directive? Yes  Type of Advance Directive Out of facility DNR (pink MOST or yellow form)  Does patient want to make changes to medical advance directive? -  Copy of [place] in Chart? No - copy requested  Pre-existing out of facility DNR order (yellow form or pink MOST form) Yellow form placed in chart (order not valid for inpatient use);Pink MOST form placed in chart (order not valid for inpatient use)     Chief Complaint  Patient presents with   Medical Management of Chronic Issues    6 month follow up. Patient would like to discuss his advance directives.     HPI: Patient is a 80 y.o. male seen today for medical management of chronic diseases.    Patient has a history of COVID-pneumonia in January 2022.  Was treated with remdesivir and prednisone.  Hospital course was complicated by SVT and diastolic CHF. He was on prednisone taper until mid February Is seen by pulmonology for chronic dyspnea on exertion.  Was recommended continuous oxygen but does not need it anymore.  Only uses it when he goes to [place] Also has some post-COVID cognitive issues. Had appointment with Dr. Vaughan Browner on 5/18.   And plan was for PFTs.  But patient was tested again and was positive for COVID.    Plan is now to check IgG antibodies and consider Bebtelovimab/ Evusheld  He continues to be asymptomatic  Also has history of Testicular B-cell lymphoma in 2019 with recurrence in 2021 s/p chemotherapy And s/p right orchiectomy.  Per oncology he is in remission  Benign tremors in right hand unable to sign Hypertension Arthritis  takes meloxicam   Past Medical History:  Diagnosis Date   Age-related macular degeneration, dry, both eyes    B-cell lymphoma [facility]) oncologist--- dr Irene Limbo   dx  2000 primary right testicular diffuse large b-cell lymphoma;  s/p  right orchiectomy 2000, completed chemo, intrathcal chemo (spine injection's), and radiation to left testis in 2000   Carotid stenosis, asymptomatic, left ?   ED (erectile dysfunction)    Elevated diaphragm    right   Erectile dysfunction    History of basal cell carcinoma (BCC) excision    multiple excision in office   History of kidney stones    History of seizure    03-03-2020 per pt x1 while taking interferan for melanoma in 2001 ,  none since, told a side effect of interferan   History of squamous cell carcinoma excision    multiple skin excision in office   Hyperlipidemia    Hypertension    followed by pcp  (03-03-2020 per pt had a stress test approx. 2010, told normal)   LBBB (left bundle branch block)    Lymphoma of testis ([place]) 2000   s/p right orchiectomy    Mass of left testis    MGUS (monoclonal gammopathy of unknown significance)    oncologist--- dr Irene Limbo   Mixed hyperlipidemia 10/26/2020   Personal history of malignant melanoma of skin dermatologist--- dr Wilhemina Bonito   dx 2001 left calf area  s/p WLE ,  completed one year interferan (03-03-2020 pt states did not involve lymph nodes, no recurrence)   Urgency of urination    Wears hearing aid in both ears     Past  Surgical History:  Procedure Laterality Date   CATARACT EXTRACTION W/ INTRAOCULAR LENS IMPLANT Bilateral 2018   IR IMAGING GUIDED PORT INSERTION  03/31/2020   MELANOMA EXCISION  10/1999   ORCHIECTOMY Right 2000   ORCHIECTOMY Left 03/05/2020   Procedure: ORCHIECTOMY;  Surgeon: [name], MD;  [facility];  Service: Urology;  Laterality: Left;   TONSILLECTOMY  child   TOTAL KNEE ARTHROPLASTY Right 06/2009    Allergies  Allergen Reactions   Latex Itching    Latex tape causes itching and redness.    Outpatient Encounter Medications as of 03/10/2021  Medication Sig   albuterol (VENTOLIN HFA) 108 (90 Base) MCG/ACT  inhaler Inhale 2 puffs into the lungs every 6 (six) hours as needed for shortness of breath.   Cholecalciferol 100 MCG (4000 UT) CAPS Take 1 capsule (4,000 Units total) by mouth daily.   fluticasone (FLONASE) 50 MCG/ACT nasal spray Place 2 sprays into both nostrils daily.   losartan (COZAAR) 100 MG tablet TAKE 1 TABLET BY MOUTH EVERY DAY   meloxicam (MOBIC) 7.5 MG tablet Take 1 tablet (7.5 mg total) by mouth daily.   Multiple Vitamins-Minerals (PRESERVISION AREDS 2 PO) Take 1 capsule by mouth 2 (two) times daily.    mupirocin 2% oint-hydrocortisone 2.5% cream-nystatin cream-zinc oxide 13% oint 1:1:1:5 mixture Apply topically 2 (two) times daily as needed for rash, itching or irritation.   mupirocin ointment (BACTROBAN) 2 % Apply topically.   nystatin (NYSTATIN) powder Apply 1 application topically 4 (four) times daily. Apply to groin area for yeast infection until resolved   polyethylene glycol (MIRALAX / GLYCOLAX) 17 g packet Take 17 g by mouth daily.   simvastatin (ZOCOR) 40 MG tablet TAKE 1 TABLET BY MOUTH EVERY DAY   Testosterone 20.25 MG/ACT (1.62%) GEL Apply 40.5 mg topically daily.   valACYclovir (VALTREX) 1000 MG tablet Take 1000 (1 grams) PO 2 times daily for 3 days as needed for flares (herpes)   lactulose (CHRONULAC) 10 GM/15ML solution Take 15 mLs (10 g total) by mouth daily.   [DISCONTINUED] azithromycin (ZITHROMAX Z-PAK) 250 MG tablet Take 2 tabs today, then 1 tab until gone   [DISCONTINUED] benzonatate (TESSALON) 100 MG capsule Take 1 capsule (100 mg total) by mouth 3 (three) times daily as needed for cough.   [DISCONTINUED] Carboxymethylcellul-Glycerin (REFRESH RELIEVA OP) One drop both eyes daily as needed for itchy eyes.   [DISCONTINUED] lactose free nutrition (BOOST) LIQD Take 237 mLs by mouth 3 (three) times daily between meals.   [DISCONTINUED] melatonin 5 MG TABS Take 5 mg by mouth.   [DISCONTINUED] metoprolol tartrate (LOPRESSOR) 25 MG tablet Take 0.5 tablets (12.5 mg total)  by mouth 2 (two) times daily. (Patient not taking: Reported on 03/10/2021)   [DISCONTINUED] predniSONE (DELTASONE) 10 MG tablet Take 4 tablets (40 mg total) by mouth daily with breakfast.   [DISCONTINUED] senna-docusate (SENOKOT-S) 8.6-50 MG tablet Take 1 tablet by mouth 2 (two) times daily.   No facility-administered encounter medications on file as of 03/10/2021.    Review of Systems:  Review of Systems Review of Systems  Constitutional: Negative for activity change, appetite change, chills, diaphoresis, fatigue and fever.  HENT: Negative for mouth sores, postnasal drip, rhinorrhea, sinus pain and sore throat.   Respiratory: Negative for apnea, cough, chest tightness, shortness of breath and wheezing.   Cardiovascular: Negative for chest pain, palpitations and leg swelling.  Gastrointestinal: Negative for abdominal distention, abdominal pain, constipation, diarrhea, nausea and vomiting.  Genitourinary: Negative for dysuria and  frequency.  Musculoskeletal: Negative for arthralgias, joint swelling and myalgias.  Skin: Negative for rash.  Neurological: Negative for dizziness, syncope, weakness, light-headedness and numbness.  Psychiatric/Behavioral: Negative for behavioral problems, confusion and sleep disturbance.    Health Maintenance  Topic Date Due   INFLUENZA VACCINE  05/17/2021   TETANUS/TDAP  03/04/2024   COVID-19 Vaccine  Completed   PNA vac Low Risk Adult  Completed   HPV VACCINES  Aged Out    Physical Exam: Vitals:   03/10/21 1040  BP: (!) 144/102  Pulse: 70  Temp: 98.1 F (36.7 C)  SpO2: 96%  Weight: 193 lb 3.2 oz (87.6 kg)  Height: 6' (1.829 m)   Body mass index is 26.2 kg/m. Physical Exam Constitutional: Oriented to person, place, and time. Well-developed and well-nourished.  HENT:  Head: Normocephalic.  Mouth/Throat: Oropharynx is clear and moist.  Eyes: Pupils are equal, round, and reactive to light.  Neck: Neck supple.  Cardiovascular: Normal rate and  normal heart sounds.  No murmur heard. Pulmonary/Chest: Effort normal and breath sounds normal. No respiratory distress. No wheezes. Has rales Bilateral Right more then Left Abdominal: Soft. Bowel sounds are normal. No distension. There is no tenderness. There is no rebound.  Musculoskeletal: No edema.  Lymphadenopathy: none Neurological: Alert and oriented to person, place, and time. Gait stable Skin: Skin is warm and dry.  Psychiatric: Normal mood and affect. Behavior is normal. Thought content normal.   Labs reviewed: Basic Metabolic Panel: Recent Labs    10/30/20 0150 10/31/20 0302 11/01/20 0649 11/10/20 0756 11/17/20 0000 11/19/20 1323 02/18/21 1328  NA 134* 135   < > 142 141 141 141  K 4.7 4.2   < > 3.9 4.0 3.9 4.9  CL 98 95*   < > 99 102 104 103  CO2 26 30   < > 32 29* 29 31  GLUCOSE 172* 131*   < > 94  --  100* 178*  BUN 19 24*   < > 34* 17 17 16   CREATININE 0.77 0.84   < > 0.90 0.7 0.83 0.91  CALCIUM 8.5* 8.7*   < > 8.9 8.7 8.7* 9.2  MG 2.2 2.1  --  2.4  --   --   --    < > = values in this interval not displayed.   Liver Function Tests: Recent Labs    11/05/20 0325 11/19/20 1323 02/18/21 1328  AST 17 16 29   ALT 42 28 33  ALKPHOS 52 54 69  BILITOT 0.6 0.6 0.5  PROT 5.7* 6.2* 6.5  ALBUMIN 2.8* 3.2* 4.0   No results for input(s): LIPASE, AMYLASE in the last 8760 hours. No results for input(s): AMMONIA in the last 8760 hours. CBC: Recent Labs    11/08/20 0635 11/17/20 0000 11/19/20 1323 02/18/21 1328  WBC 12.8* 6.4 7.8 7.5  NEUTROABS 9.4*  --  6.0 5.1  HGB 13.5 13.5 12.7* 15.0  HCT 43.1 39* 40.1 46.1  MCV 101.7*  --  101.5* 101.8*  PLT 187 152 170 200   Lipid Panel: Recent Labs    10/20/20 1400  TRIG 108   Lab Results  Component Value Date   HGBA1C 5.7 02/27/2020    Procedures since last visit: No results found.  Assessment/Plan 1. 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 CT scan in 3/22 showed  Covid related changes On Continous Oxygen when in [place] Double boosted Has Follow up with Dr Vaughan Browner  2. Benign essential tremor He plans  to restart his Propanol Had some side effects before But wants to give try again Cannot Sign  3. Primary hypertension Elevated today  Was on Lopressor he had stopped but planning to  restart Propanolol  4. SVT (supraventricular tachycardia) ([place]) Has stopped his Lopressor Echo was normal in Hospital  5. Neck pain Takes Mobic Discussed with side effect of gastritis and CKD Make sure he takes with food  6. Testosterone deficiency On testosterone injection by urology  7. Pure hypercholesterolemia Continue on statin  8. 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 ACP (advance care planning) Patient is DNR. Limited intervention with IV fluids, Antibiotics and Feeding tube for limited time D/W him     Labs/tests ordered:  * No order type specified * Next appt:  09/06/2021

## 2021-03-16 ENCOUNTER — Telehealth: Payer: Self-pay | Admitting: Pulmonary Disease

## 2021-03-16 LAB — SPECIMEN STATUS REPORT

## 2021-03-16 NOTE — Telephone Encounter (Signed)
I have called and LM on VM for the pt to make him aware that the lab results are still pending.  We will call with these results once they are reviewed by the provider.

## 2021-03-16 NOTE — Telephone Encounter (Signed)
Not back yet in testing.

## 2021-03-16 NOTE — Telephone Encounter (Signed)
I have called the pt and he is aware that these results are not back yet.  Irma do you know anything about these results?  thanks

## 2021-03-16 NOTE — Telephone Encounter (Signed)
Component 6 d ago  specimen status report Comment   Comment: Ambiguous Test Order  Ambiguous Test Order  Attempts to contact your facility to clarify an ambiguous test order  were unsuccessful. Please contact the laboratory to verify the  request. Specimens are routinely held for 7 days from date of receipt.  Ambiguous test order:  REQUEST: [id number] SARS COV2 AB, IGM  Effective December 29, 2020, Test [id number] SARS-CoV-2 Antibody,            IgM will be discontinued.    Pt called back and stated that he did not understand what this is in his mychart.  Why is this cancelled?  He stated that he has family coming into town starting tomorrow and he doesn't know if he needs to cancel this outing with his family or not.  Benay Spice is there any way that we can check on this from the lab?  There is another order that stated it has been collected.  I thought that it was just 2 of the same order placed.  Please advise. Thanks

## 2021-03-17 NOTE — Telephone Encounter (Signed)
I called and spoke with patient regarding lab results. Patient verbalized understanding, nothing further needed. 

## 2021-03-17 NOTE — Telephone Encounter (Signed)
The original test ordered was no longer available testing is in progress and will result by 03/22/2021.

## 2021-03-18 LAB — SAR COV2 SEROLOGY (COVID19)AB(IGG),IA
SARS-CoV-2 Semi-Quant IgG Ab: >800 AU/mL (ref <13.0)
SARS-CoV-2 Spike Ab Interp: POSITIVE

## 2021-03-18 LAB — SPECIMEN STATUS REPORT

## 2021-04-29 ENCOUNTER — Other Ambulatory Visit: Payer: Self-pay

## 2021-04-29 ENCOUNTER — Non-Acute Institutional Stay: Payer: PPO | Admitting: Adult Health

## 2021-04-29 ENCOUNTER — Encounter: Payer: Self-pay | Admitting: Adult Health

## 2021-04-29 VITALS — BP 116/92 | HR 73 | Temp 96.7°F | Ht 72.0 in | Wt 191.0 lb

## 2021-04-29 DIAGNOSIS — I459 Conduction disorder, unspecified: Secondary | ICD-10-CM | POA: Diagnosis not present

## 2021-04-29 NOTE — Progress Notes (Signed)
Location:  [facility]  Provider:  Cindi Carbon, [place] [id number]   Goals of Care:  Advanced Directives 04/29/2021  Does Patient Have a Medical Advance Directive? Yes  Type of Advance Directive Out of facility DNR (pink MOST or yellow form)  Does patient want to make changes to medical advance directive? No - Patient declined  Copy of [place] in Chart? -  Pre-existing out of facility DNR order (yellow form or pink MOST form) Yellow form placed in chart (order not valid for inpatient use);Pink MOST form placed in chart (order not valid for inpatient use)     Chief Complaint  Patient presents with   Medical Management of Chronic Issues    Patient returns to the clinic for follow up on his [employer] screening.     HPI: Patient is a 80 y.o. male seen today for concern for ventricular ectopy. He had a [employer] screening and they did an ekg which showed ventricular ectopy.  He has not had any feelings of palpitations or skipped beats. No fainting or chest pain. He does have some chronic doe due to prior covid infection but has improved and is now off oxygen. He continues to monitor is 02 sats which are good. He reports he had lab done with the [employer] as well and is not aware of any abnormalities.  12 lead ekg obtained which showed no acute ST abnormalities and a LBBB which is not new. 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   ED (erectile dysfunction)    Elevated diaphragm    right   Erectile dysfunction    History of basal cell carcinoma (BCC) excision    multiple excision in  office   History of kidney stones    History of seizure    03-03-2020 per pt x1 while taking interferan for melanoma in 2001 ,  none since, told a side effect of interferan   History of squamous cell carcinoma excision    multiple skin excision in office   Hyperlipidemia    Hypertension    followed by pcp  (03-03-2020 per pt had a stress test approx. 2010, told normal)   LBBB (left bundle branch block)    Lymphoma of testis ([place]) 2000   s/p right orchiectomy    Mass of left testis    MGUS (monoclonal gammopathy of unknown significance)    oncologist--- dr Irene Limbo   Mixed hyperlipidemia 10/26/2020   Personal history of malignant melanoma of skin dermatologist--- dr Wilhemina Bonito   dx 2001 left calf area  s/p WLE ,  completed one year interferan (03-03-2020 pt states did not involve lymph nodes, no recurrence)   Urgency of urination    Wears hearing aid in both ears     Past Surgical History:  Procedure Laterality Date   CATARACT EXTRACTION W/ INTRAOCULAR LENS IMPLANT Bilateral 2018   IR IMAGING GUIDED PORT INSERTION  03/31/2020   MELANOMA EXCISION  10/1999   ORCHIECTOMY Right 2000   ORCHIECTOMY Left 03/05/2020   Procedure: ORCHIECTOMY;  Surgeon: [name], MD;  Location: [facility];  Service: Urology;  Laterality:  Left;   TONSILLECTOMY  child   TOTAL KNEE ARTHROPLASTY Right 06/2009    Allergies  Allergen Reactions   Latex Itching    Latex tape causes itching and redness.    Outpatient Encounter Medications as of 04/29/2021  Medication Sig   albuterol (VENTOLIN HFA) 108 (90 Base) MCG/ACT inhaler Inhale 2 puffs into the lungs every 6 (six) hours as needed for shortness of breath.   Cholecalciferol 100 MCG (4000 UT) CAPS Take 1 capsule (4,000 Units total) by mouth daily.   fluticasone (FLONASE) 50 MCG/ACT nasal spray Place 2 sprays into both nostrils daily.   losartan (COZAAR) 100 MG tablet TAKE 1 TABLET BY MOUTH EVERY DAY   meloxicam (MOBIC) 7.5 MG tablet Take 1  tablet (7.5 mg total) by mouth daily.   Multiple Vitamins-Minerals (PRESERVISION AREDS 2 PO) Take 1 capsule by mouth 2 (two) times daily.    mupirocin 2% oint-hydrocortisone 2.5% cream-nystatin cream-zinc oxide 13% oint 1:1:1:5 mixture Apply topically 2 (two) times daily as needed for rash, itching or irritation.   mupirocin ointment (BACTROBAN) 2 % Apply topically.   simvastatin (ZOCOR) 40 MG tablet TAKE 1 TABLET BY MOUTH EVERY DAY   Testosterone 20.25 MG/ACT (1.62%) GEL Apply 40.5 mg topically daily.   valACYclovir (VALTREX) 1000 MG tablet Take 1000 (1 grams) PO 2 times daily for 3 days as needed for flares (herpes)   lactulose (CHRONULAC) 10 GM/15ML solution Take 15 mLs (10 g total) by mouth daily. (Patient not taking: Reported on 04/29/2021)   nystatin (NYSTATIN) powder Apply 1 application topically 4 (four) times daily. Apply to groin area for yeast infection until resolved (Patient not taking: Reported on 04/29/2021)   polyethylene glycol (MIRALAX / GLYCOLAX) 17 g packet Take 17 g by mouth daily. (Patient not taking: Reported on 04/29/2021)   No facility-administered encounter medications on file as of 04/29/2021.    Review of Systems:  Review of Systems  Constitutional:  Positive for fatigue. Negative for activity change, appetite change, chills, diaphoresis, fever and unexpected weight change.  Respiratory:  Negative for cough, shortness of breath (on exertion improving over time after covid), wheezing and stridor.   Cardiovascular:  Negative for chest pain, palpitations and leg swelling.  Gastrointestinal:  Negative for abdominal distention, abdominal pain, constipation and diarrhea.  Genitourinary:  Negative for difficulty urinating and dysuria.  Musculoskeletal:  Negative for arthralgias, back pain, gait problem, joint swelling and myalgias.  Neurological:  Negative for dizziness, seizures, syncope, facial asymmetry, speech difficulty, weakness and headaches.  Hematological:  Negative  for adenopathy. Does not bruise/bleed easily.  Psychiatric/Behavioral:  Negative for agitation, behavioral problems and confusion.    Health Maintenance  Topic Date Due   INFLUENZA VACCINE  05/17/2021   COVID-19 Vaccine (5 - Booster for Moderna series) 05/27/2021   TETANUS/TDAP  03/04/2024   PNA vac Low Risk Adult  Completed   Zoster Vaccines- Shingrix  Completed   HPV VACCINES  Aged Out    Physical Exam: Vitals:   04/29/21 1426  BP: (!) 116/92  Pulse: 73  Temp: (!) 96.7 F (35.9 C)  SpO2: 97%  Weight: 191 lb (86.6 kg)  Height: 6' (1.829 m)   Body mass index is 25.9 kg/m. Physical Exam Vitals and nursing note reviewed.  Constitutional:      General: He is not in acute distress.    Appearance: He is not diaphoretic.  HENT:     Head: Normocephalic and atraumatic.  Neck:     Thyroid: No thyromegaly.  Vascular: No JVD.     Trachea: No tracheal deviation.  Cardiovascular:     Rate and Rhythm: Normal rate and regular rhythm.     Heart sounds: No murmur heard.    Comments: With skipped beats Pulmonary:     Effort: Pulmonary effort is normal. No respiratory distress.     Breath sounds: Normal breath sounds. No wheezing.  Abdominal:     General: Bowel sounds are normal. There is no distension.     Palpations: Abdomen is soft.     Tenderness: There is no abdominal tenderness.  Lymphadenopathy:     Cervical: No cervical adenopathy.  Skin:    General: Skin is warm and dry.  Neurological:     Mental Status: He is alert and oriented to person, place, and time.     Cranial Nerves: No cranial nerve deficit.    Labs reviewed: Basic Metabolic Panel: Recent Labs    10/30/20 0150 10/31/20 0302 11/01/20 0649 11/10/20 0756 11/17/20 0000 11/19/20 1323 02/18/21 1328  NA 134* 135   < > 142 141 141 141  K 4.7 4.2   < > 3.9 4.0 3.9 4.9  CL 98 95*   < > 99 102 104 103  CO2 26 30   < > 32 29* 29 31  GLUCOSE 172* 131*   < > 94  --  100* 178*  BUN 19 24*   < > 34* 17 17  16   CREATININE 0.77 0.84   < > 0.90 0.7 0.83 0.91  CALCIUM 8.5* 8.7*   < > 8.9 8.7 8.7* 9.2  MG 2.2 2.1  --  2.4  --   --   --    < > = values in this interval not displayed.   Liver Function Tests: Recent Labs    11/05/20 0325 11/19/20 1323 02/18/21 1328  AST 17 16 29   ALT 42 28 33  ALKPHOS 52 54 69  BILITOT 0.6 0.6 0.5  PROT 5.7* 6.2* 6.5  ALBUMIN 2.8* 3.2* 4.0   No results for input(s): LIPASE, AMYLASE in the last 8760 hours. No results for input(s): AMMONIA in the last 8760 hours. CBC: Recent Labs    11/08/20 0635 11/17/20 0000 11/19/20 1323 02/18/21 1328  WBC 12.8* 6.4 7.8 7.5  NEUTROABS 9.4*  --  6.0 5.1  HGB 13.5 13.5 12.7* 15.0  HCT 43.1 39* 40.1 46.1  MCV 101.7*  --  101.5* 101.8*  PLT 187 152 170 200   Lipid Panel: Recent Labs    10/20/20 1400  TRIG 108   Lab Results  Component Value Date   HGBA1C 5.7 02/27/2020    Procedures since last visit: No results found.  Assessment/Plan  1. Skipped beats Pt is asymptomatic and this was not noted on EKG but an occasional skipped beat on exam was noted. Recommend pt monitor for symptoms of dizziness, palpitations, and let us know if symptoms develop.   Total time 32min:  time greater than 50% of total time spent doing pt counseling and coordination of care    Labs/tests ordered:  * No order type specified * Next appt:  09/06/2021

## 2021-05-08 ENCOUNTER — Other Ambulatory Visit: Payer: Self-pay

## 2021-05-08 ENCOUNTER — Encounter (HOSPITAL_BASED_OUTPATIENT_CLINIC_OR_DEPARTMENT_OTHER): Payer: Self-pay | Admitting: Urology

## 2021-05-08 ENCOUNTER — Emergency Department (HOSPITAL_BASED_OUTPATIENT_CLINIC_OR_DEPARTMENT_OTHER)
Admission: EM | Admit: 2021-05-08 | Discharge: 2021-05-08 | Disposition: A | Discharge status: Home / Self Care | Payer: PPO | Attending: Emergency Medicine | Admitting: Emergency Medicine

## 2021-05-08 DIAGNOSIS — Z8616 Personal history of COVID-19: Secondary | ICD-10-CM | POA: Diagnosis not present

## 2021-05-08 DIAGNOSIS — Z79899 Other long term (current) drug therapy: Secondary | ICD-10-CM | POA: Insufficient documentation

## 2021-05-08 DIAGNOSIS — Z96651 Presence of right artificial knee joint: Secondary | ICD-10-CM | POA: Diagnosis not present

## 2021-05-08 DIAGNOSIS — Z86010 Personal history of colonic polyps: Secondary | ICD-10-CM | POA: Insufficient documentation

## 2021-05-08 DIAGNOSIS — I10 Essential (primary) hypertension: Secondary | ICD-10-CM | POA: Insufficient documentation

## 2021-05-08 DIAGNOSIS — Z8547 Personal history of malignant neoplasm of testis: Secondary | ICD-10-CM | POA: Insufficient documentation

## 2021-05-08 DIAGNOSIS — Z85828 Personal history of other malignant neoplasm of skin: Secondary | ICD-10-CM | POA: Insufficient documentation

## 2021-05-08 DIAGNOSIS — Z91040 Latex allergy status: Secondary | ICD-10-CM | POA: Insufficient documentation

## 2021-05-08 DIAGNOSIS — Z8572 Personal history of non-Hodgkin lymphomas: Secondary | ICD-10-CM | POA: Insufficient documentation

## 2021-05-08 DIAGNOSIS — H5789 Other specified disorders of eye and adnexa: Secondary | ICD-10-CM | POA: Diagnosis present

## 2021-05-08 DIAGNOSIS — H1132 Conjunctival hemorrhage, left eye: Secondary | ICD-10-CM

## 2021-05-08 MED ORDER — TETRACAINE HCL 0.5 % OP SOLN
2.0000 [drp] | Freq: Once | OPHTHALMIC | Status: AC
  Administered 2021-05-08: 2 [drp] via OPHTHALMIC
  Filled 2021-05-08: qty 4

## 2021-05-08 MED ORDER — FLUORESCEIN SODIUM 1 MG OP STRP
1.0000 | ORAL_STRIP | Freq: Once | OPHTHALMIC | Status: AC
  Administered 2021-05-08: 1 via OPHTHALMIC
  Filled 2021-05-08: qty 1

## 2021-05-08 NOTE — ED Notes (Signed)
Pt dc home. Pt voiced understanding of dc instructions.

## 2021-05-08 NOTE — ED Provider Notes (Signed)
[facility] EMERGENCY DEPT Provider Note   CSN: [id number] Arrival date & time: 05/08/21  1157     History Chief Complaint  Patient presents with   Eye Problem    Antonio [name] is a 80 y.o. male.  The history is provided by the patient.  Eye Problem Location:  Left eye Duration:  1 day Timing:  Constant Progression:  Unchanged Chronicity:  New Context comment:  Redness to left eye, no pain, no injury Relieved by:  Nothing Worsened by:  Nothing Associated symptoms: redness   Associated symptoms: no blurred vision, no crusting, no decreased vision, no discharge, no double vision, no facial rash, no headaches, no inflammation, no itching, no nausea, no numbness, no photophobia, no scotomas, no swelling, no tearing, no tingling, no vomiting and no weakness       Past Medical History:  Diagnosis Date   Age-related macular degeneration, dry, both eyes    B-cell lymphoma [facility]) oncologist--- dr Irene Limbo   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   ED (erectile dysfunction)    Elevated diaphragm    right   Erectile dysfunction    History of basal cell carcinoma (BCC) excision    multiple excision in office   History of kidney stones    History of seizure    03-03-2020 per pt x1 while taking interferan for melanoma in 2001 ,  none since, told a side effect of interferan   History of squamous cell carcinoma excision    multiple skin excision in office   Hyperlipidemia    Hypertension    followed by pcp  (03-03-2020 per pt had a stress test approx. 2010, told normal)   LBBB (left bundle branch block)    Lymphoma of testis ([place]) 2000   s/p right orchiectomy    Mass of left testis    MGUS (monoclonal gammopathy of unknown significance)    oncologist--- dr Irene Limbo   Mixed  hyperlipidemia 10/26/2020   Personal history of malignant melanoma of skin dermatologist--- dr Wilhemina Bonito   dx 2001 left calf area  s/p WLE ,  completed one year interferan (03-03-2020 pt states did not involve lymph nodes, no recurrence)   Urgency of urination    Wears hearing aid in both ears     Patient Active Problem List   Diagnosis Date Noted   Chronic respiratory failure with hypoxia ([place]) 12/16/2020   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 12/16/2020   Post-COVID chronic neurologic symptoms 12/16/2020   Lobar pneumonia ([place]) 10/27/2020   Acute hypoxemic respiratory failure due to COVID-19 ([place]) 10/26/2020   Mixed hyperlipidemia [id number]   Acute metabolic encephalopathy [id number]   Symptomatic anemia 10/20/2020   Acute respiratory failure with hypoxia ([place]) 10/20/2020   Pleural effusion 10/20/2020   Testosterone deficiency 09/09/2020   S/P orchiectomy 09/09/2020   Diffuse large B cell lymphoma ([place]) 03/17/2020   Testicular malignant neoplasm ([place]) 03/17/2020   Counseling regarding advance care planning and goals of care 03/17/2020   Hyperglycemia 09/05/2018   Mass of soft tissue of left lower extremity 09/05/2018   Monoclonal gammopathy of unknown significance (MGUS) 09/05/2018   Macrocytosis without anemia 09/05/2018   Macular degeneration of both eyes 06/21/2017   Slow transit constipation 06/21/2017   Sleep-wake disorder 06/21/2017   Essential hypertension 06/21/2017   Pure hypercholesterolemia 06/21/2017  History of B-cell lymphoma 06/21/2017   Personal history of malignant melanoma 06/21/2017   Left carotid artery stenosis 06/21/2017   DDD (degenerative disc disease), cervical 06/21/2017   History of kidney stones 06/21/2017   Tubular adenoma of colon 06/21/2017   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   Vitamin D deficiency 06/21/2017    Past Surgical History:  Procedure Laterality Date   CATARACT EXTRACTION W/ INTRAOCULAR LENS IMPLANT Bilateral  2018   IR IMAGING GUIDED PORT INSERTION  03/31/2020   MELANOMA EXCISION  10/1999   ORCHIECTOMY Right 2000   ORCHIECTOMY Left 03/05/2020   Procedure: ORCHIECTOMY;  Surgeon: [name], MD;  Location: [facility];  Service: Urology;  Laterality: Left;   TONSILLECTOMY  child   TOTAL KNEE ARTHROPLASTY Right 06/2009       Family History  Problem Relation Age of Onset   Cataracts Mother    Transient ischemic attack Mother    Asthma Mother    Cataracts Father    Diabetes Father        borderline   Hypertension Father    Hyperlipidemia Father    Hyperlipidemia Son    Hypertension Son    Amblyopia Neg Hx    Blindness Neg Hx    Glaucoma Neg Hx    Macular degeneration Neg Hx    Retinal detachment Neg Hx    Strabismus Neg Hx    Retinitis pigmentosa Neg Hx     Social History   Tobacco Use   Smoking status: Never   Smokeless tobacco: Never  Vaping Use   Vaping Use: Never used  Substance Use Topics   Alcohol use: Yes    Alcohol/week: 14.0 standard drinks    Types: 14 Glasses of wine per week    Comment: 2 wine daily   Drug use: Never    Home Medications Prior to Admission medications   Medication Sig Start Date End Date Taking? Authorizing Provider  albuterol (VENTOLIN HFA) 108 (90 Base) MCG/ACT inhaler Inhale 2 puffs into the lungs every 6 (six) hours as needed for shortness of breath. 10/19/20   Reed, Tiffany L, DO  Cholecalciferol 100 MCG (4000 UT) CAPS Take 1 capsule (4,000 Units total) by mouth daily. 10/19/20   Reed, Tiffany L, DO  fluticasone (FLONASE) 50 MCG/ACT nasal spray Place 2 sprays into both nostrils daily. 12/16/20   Reed, Tiffany L, DO  lactulose (CHRONULAC) 10 GM/15ML solution Take 15 mLs (10 g total) by mouth daily. Patient not taking: Reported on 04/29/2021 11/11/20   Swayze, Ava, DO  losartan (COZAAR) 100 MG tablet TAKE 1 TABLET BY MOUTH EVERY DAY 12/31/20   Reed, Tiffany L, DO  meloxicam (MOBIC) 7.5 MG tablet Take 1 tablet (7.5 mg total) by mouth  daily. 12/02/20   Reed, Tiffany L, DO  Multiple Vitamins-Minerals (PRESERVISION AREDS 2 PO) Take 1 capsule by mouth 2 (two) times daily.     [provider]  mupirocin 2% oint-hydrocortisone 2.5% cream-nystatin cream-zinc oxide 13% oint 1:1:1:5 mixture Apply topically 2 (two) times daily as needed for rash, itching or irritation. 12/02/20   Reed, Tiffany L, DO  mupirocin ointment (BACTROBAN) 2 % Apply topically. 12/02/20   [provider]  nystatin (NYSTATIN) powder Apply 1 application topically 4 (four) times daily. Apply to groin area for yeast infection until resolved Patient not taking: Reported on 04/29/2021 12/02/20   Mariea Clonts, Tiffany L, DO  polyethylene glycol (MIRALAX / GLYCOLAX) 17 g packet Take 17 g by mouth daily. Patient not taking:  Reported on 04/29/2021 11/11/20   Swayze, Ava, DO  simvastatin (ZOCOR) 40 MG tablet TAKE 1 TABLET BY MOUTH EVERY DAY 12/17/20   Reed, Tiffany L, DO  Testosterone 20.25 MG/ACT (1.62%) GEL Apply 40.5 mg topically daily. 03/23/20   [provider]  valACYclovir (VALTREX) 1000 MG tablet Take 1000 (1 grams) PO 2 times daily for 3 days as needed for flares (herpes)    [provider]    Allergies    Latex  Review of Systems   Review of Systems  Constitutional:  Negative for fever.  Eyes:  Positive for redness. Negative for blurred vision, double vision, photophobia, pain, discharge, itching and visual disturbance.  Gastrointestinal:  Negative for nausea and vomiting.  Neurological:  Negative for dizziness, tingling, tremors, seizures, speech difficulty, weakness, light-headedness, numbness and headaches.   Physical Exam Updated Vital Signs BP (!) 142/79 (BP Location: Right Arm)   Pulse 63   Temp 98.3 F (36.8 C)   Resp 18   Ht 6' (1.829 m)   Wt 86.6 kg   SpO2 97%   BMI 25.90 kg/m   Physical Exam Constitutional:      Appearance: Normal appearance.  HENT:     Head: Normocephalic and atraumatic.     Nose: Nose normal.      Mouth/Throat:     Mouth: Mucous membranes are moist.     Pharynx: No oropharyngeal exudate.  Eyes:     Extraocular Movements: Extraocular movements intact.     Pupils: Pupils are equal, round, and reactive to light.     Comments: Right conjunctiva clear, left conjunctiva with subconjunctival hemorrhage, fluorescein uptake is negative in both eyes, pressure in both eyes is within normal limits, visual acuity intact  Musculoskeletal:     Cervical back: Normal range of motion.  Neurological:     Mental Status: He is alert.    ED Results / Procedures / Treatments   Labs (all labs ordered are listed, but only abnormal results are displayed) Labs Reviewed - No data to display  EKG None  Radiology No results found.  Procedures Procedures   Medications Ordered in ED Medications  fluorescein ophthalmic strip 1 strip (1 strip Both Eyes Given by Other 05/08/21 1253)  tetracaine (PONTOCAINE) 0.5 % ophthalmic solution 2 drop (2 drops Both Eyes Given by Other 05/08/21 1253)    ED Course  I have reviewed the triage vital signs and the nursing notes.  Pertinent labs & imaging results that were available during my care of the patient were reviewed by me and considered in my medical decision making (see chart for details).    MDM Rules/Calculators/A&P                           Sela Hua is here with left eye redness.  Appears to have a subconjunctival hemorrhage of the left eye.  Normal visual acuity, normal eye pressure, no fluorescein uptake.  No foreign body sensation.  No pain.  Overall suspect that this will improve on its own.  We will have him follow-up with eye doctor.  Discharged in good condition.  No concern for glaucoma or foreign body or corneal abrasion.  This chart was dictated using voice recognition software.  Despite best efforts to proofread,  errors can occur which can change the documentation meaning.   Final Clinical Impression(s) / ED Diagnoses Final diagnoses:   Subconjunctival hemorrhage of left eye    Rx / DC  Orders ED Discharge Orders     None        Lennice Sites, DO 05/08/21 1308

## 2021-05-10 DIAGNOSIS — Z961 Presence of intraocular lens: Secondary | ICD-10-CM | POA: Diagnosis not present

## 2021-05-10 DIAGNOSIS — H1132 Conjunctival hemorrhage, left eye: Secondary | ICD-10-CM | POA: Diagnosis not present

## 2021-05-19 NOTE — Progress Notes (Signed)
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  HISTORY OF PRESENTING ILLNESS:   Antonio [name] is a wonderful 79 y.o. male who has been referred to [place] by Dr. Hollace [name] for evaluation and management of Bone lesion of left lower leg. The pt reports that he is doing well [name].   The pt reports that he cannot feel the ovoid finding from his 04/09/18 MRI as noted below. He denies being able to feel anything in his skin as well. He notes that he received a needle injection in his knee prior to this MRI. The pt notes that his knee has lost much cartilage and is considering a knee replacement. He denies any recent injuries to his knee or falls. He has contacted Dr. Alphonsa [name] in surgery and another surgeon as well and will be making an appointment.   The pt notes that he does not feel any differently recently as compared to 6 months to a year ago. He does note that he normally has constipation in the spring, and has continued to have mild constipation. He denies any other concerns or symptoms.   The pt previously saw my colleague Dr. Lurline [name] for primary testicular B-cell lymphoma in 2000. The pt notes that the involvement was limited to only one testicle. He received 6 intrathecal treatments in his spine, and received 4 cycles of chemotherapy (thinks this was CHOP) and radiation as well. The pt was followed by an oncologist for several years at the [facility] in [place] after moving from [place] after his [place] treatment.   The pt notes that he was first diagnosed with melanoma in October 2000. He noticed a strange spot on his left leg that was surgically resected. The pt's personal notes report a 2.9cm thick, level 4, no LN  involvement, high risk stage II, treated with Interferon three times a week for one year. He notes some squamous cell and basal cell involvements as well and sees a dermatologist, Dr. Wilhemina [name], regularly.   Of note prior to the patient's visit today, pt has had MRI Left Knee completed on 04/09/18 with results revealing An ovoid, lobulated, 23m T2 hyperintense focus within the medial subcutaneous far 1.977mdistal to the joint demonstrates demonstrates nonspecific imaging characteristics.   Most recent lab results (03/01/18) of CBC w/diff is as follows: all values are WNL except for RBC at 4.54, MCV at 100.4, MCH at 33.8.   On review of systems, pt reports left knee pain, mild constipation, and denies new or concerning symptoms, noticing any new lumps or bumps, pain along the spine, abdominal pains, problems passing urine, testicular pain/swelling, and any other symptoms.   INTERVAL HISTORY:   WiFISHER HARGADONeturns today for management and evaluation evaluation of Left Primary Testicular Large B-cell Lymphoma. The patient's last visit with [place] on 02/18/2021 The pt reports that he is doing well [name].  The pt reports still some residual SOB with exertion and at high altitudes due to his previous severe covid infection.  Lab results today 05/20/2021 of CBC w/diff and CMP reviewed with patient.  On review of systems, pt reports no fevers/chills/night sweats/unexpected weight loss.No new lumps or bumps.   MEDICAL HISTORY:  Past Medical History:  Diagnosis Date   Age-related  macular degeneration, dry, both eyes    B-cell lymphoma [facility]) oncologist--- dr Antonio [name]   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   ED (erectile dysfunction)    Elevated diaphragm    right   Erectile dysfunction    History of basal cell  carcinoma (BCC) excision    multiple excision in office   History of kidney stones    History of seizure    03-03-2020 per pt x1 while taking interferan for melanoma in 2001 ,  none since, told a side effect of interferan   History of squamous cell carcinoma excision    multiple skin excision in office   Hyperlipidemia    Hypertension    followed by pcp  (03-03-2020 per pt had a stress test approx. 2010, told normal)   LBBB (left bundle branch block)    Lymphoma of testis ([place]) 2000   s/p right orchiectomy    Mass of left testis    MGUS (monoclonal gammopathy of unknown significance)    oncologist--- dr Antonio [name]   Mixed hyperlipidemia 10/26/2020   Personal history of malignant melanoma of skin dermatologist--- dr Antonio [name]   dx 2001 left calf area  s/p WLE ,  completed one year interferan (03-03-2020 pt states did not involve lymph nodes, no recurrence)   Urgency of urination    Wears hearing aid in both ears     SURGICAL HISTORY: Past Surgical History:  Procedure Laterality Date   CATARACT EXTRACTION W/ INTRAOCULAR LENS IMPLANT Bilateral 2018   IR IMAGING GUIDED PORT INSERTION  03/31/2020   MELANOMA EXCISION  10/1999   ORCHIECTOMY Right 2000   ORCHIECTOMY Left 03/05/2020   Procedure: ORCHIECTOMY;  Surgeon: Antonio Seal, MD;  Location: [facility];  Service: Urology;  Laterality: Left;   TONSILLECTOMY  child   TOTAL KNEE ARTHROPLASTY Right 06/2009    SOCIAL HISTORY: Social History   Socioeconomic History   Marital status: Married    Spouse name: Not on file   Number of children: Not on file   Years of education: Not on file   Highest education level: Not on file  Occupational History   Not on file  Tobacco Use   Smoking status: Never   Smokeless tobacco: Never  Vaping Use   Vaping Use: Never used  Substance and Sexual Activity   Alcohol use: Yes    Alcohol/week: 14.0 standard drinks    Types: 14 Glasses of wine per week    Comment: 2 wine daily   Drug  use: Never   Sexual activity: Not on file  Other Topics Concern   Not on file  Social History Narrative   Social History      Diet?good      Do you drink/eat things with caffeine? seldom      Marital status?          yes                          What year were you married? 1962      Do you live in a house, apartment, assisted living, condo, trailer, etc.? house      Is it one or more stories?1      How many persons live in your home? 2  Do you have any pets in your home? (please list) no      Highest level of education completed? college      Current or past profession: president and [employer]      Do you exercise?   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 yes      Do you have a DNR form?       yes                            If not, do you want to discuss one?      Do you have signed POA/HPOA for forms?  yes      Functional Status      Do you have difficulty bathing or dressing yourself? no      Do you have difficulty preparing food or eating? no      Do you have difficulty managing your medications? no      Do you have difficulty managing your finances? no      Do you have difficulty affording your medications? no   Social Determinants of Health   Financial Resource Strain: Not on file  Food Insecurity: Not on file  Transportation Needs: Not on file  Physical Activity: Not on file  Stress: Not on file  Social Connections: Not on file  Intimate Partner Violence: Not on file    FAMILY HISTORY: Family History  Problem Relation Age of Onset   Cataracts Mother    Transient ischemic attack Mother    Asthma Mother    Cataracts Father    Diabetes Father        borderline   Hypertension Father    Hyperlipidemia Father    Hyperlipidemia Son    Hypertension Son    Amblyopia Neg Hx    Blindness Neg Hx    Glaucoma Neg Hx    Macular degeneration Neg Hx     Retinal detachment Neg Hx    Strabismus Neg Hx    Retinitis pigmentosa Neg Hx     ALLERGIES:  is allergic to latex.  MEDICATIONS:  Current Outpatient Medications  Medication Sig Dispense Refill   albuterol (VENTOLIN HFA) 108 (90 Base) MCG/ACT inhaler Inhale 2 puffs into the lungs every 6 (six) hours as needed for shortness of breath. 8 g 2   Cholecalciferol 100 MCG (4000 UT) CAPS Take 1 capsule (4,000 Units total) by mouth daily. 14 capsule 0   fluticasone (FLONASE) 50 MCG/ACT nasal spray Place 2 sprays into both nostrils daily. 11.1 mL 5   lactulose (CHRONULAC) 10 GM/15ML solution Take 15 mLs (10 g total) by mouth daily. (Patient not taking: Reported on 04/29/2021) 236 mL 0   losartan (COZAAR) 100 MG tablet TAKE 1 TABLET BY MOUTH EVERY DAY 90 tablet 1   meloxicam (MOBIC) 7.5 MG tablet Take 1 tablet (7.5 mg total) by mouth daily. 90 tablet 3   Multiple Vitamins-Minerals (PRESERVISION AREDS 2 PO) Take 1 capsule by mouth 2 (two) times daily.      mupirocin 2% oint-hydrocortisone 2.5% cream-nystatin cream-zinc oxide 13% oint 1:1:1:5 mixture Apply topically 2 (two) times daily as  needed for rash, itching or irritation. 120 g 3   mupirocin ointment (BACTROBAN) 2 % Apply topically.     nystatin (NYSTATIN) powder Apply 1 application topically 4 (four) times daily. Apply to groin area for yeast infection until resolved (Patient not taking: Reported on 04/29/2021) 60 g 3   polyethylene glycol (MIRALAX / GLYCOLAX) 17 g packet Take 17 g by mouth daily. (Patient not taking: Reported on 04/29/2021) 14 each 0   simvastatin (ZOCOR) 40 MG tablet TAKE 1 TABLET BY MOUTH EVERY DAY 90 tablet 0   Testosterone 20.25 MG/ACT (1.62%) GEL Apply 40.5 mg topically daily.     valACYclovir (VALTREX) 1000 MG tablet Take 1000 (1 grams) PO 2 times daily for 3 days as needed for flares (herpes)     No current facility-administered medications for this visit.    REVIEW OF SYSTEMS:   10 Point review of Systems was done is  negative except as noted above.  PHYSICAL EXAMINATION: ECOG PERFORMANCE STATUS: 0 - Asymptomatic VS reviewed - stable  There were no vitals filed for this visit.  Wt Readings from Last 3 Encounters:  05/08/21 191 lb (86.6 kg)  04/29/21 191 lb (86.6 kg)  03/10/21 193 lb 3.2 oz (87.6 kg)   There is no height or weight on file to calculate BMI.    NAD GENERAL:alert, in no acute distress and comfortable SKIN: no acute rashes, no significant lesions EYES: conjunctiva are pink and non-injected, sclera anicteric OROPHARYNX: MMM, no exudates, no oropharyngeal erythema or ulceration NECK: supple, no JVD LYMPH:  no palpable lymphadenopathy in the cervical, axillary or inguinal regions LUNGS: clear to auscultation b/l with normal respiratory effort HEART: regular rate & rhythm ABDOMEN:  normoactive bowel sounds , non tender, not distended. Extremity: no pedal edema PSYCH: alert & oriented x 3 with fluent speech NEURO: no focal motor/sensory deficits  LABORATORY DATA:  I have reviewed the data as listed  . CBC Latest Ref Rng & Units 02/18/2021 11/19/2020 11/17/2020  WBC 4.0 - 10.5 K/uL 7.5 7.8 6.4  Hemoglobin 13.0 - 17.0 g/dL 15.0 12.7(L) 13.5  Hematocrit 39.0 - 52.0 % 46.1 40.1 39(A)  Platelets 150 - 400 K/uL 200 170 152   . CBC    Component Value Date/Time   WBC 7.5 02/18/2021 1328   RBC 4.53 02/18/2021 1328   HGB 15.0 02/18/2021 1328   HGB 12.7 (L) 11/19/2020 1323   HCT 46.1 02/18/2021 1328   HCT 40.8 12/19/2019 1057   PLT 200 02/18/2021 1328   PLT 170 11/19/2020 1323   MCV 101.8 (H) 02/18/2021 1328   MCH 33.1 02/18/2021 1328   MCHC 32.5 02/18/2021 1328   RDW 13.1 02/18/2021 1328   LYMPHSABS 1.5 02/18/2021 1328   MONOABS 0.9 02/18/2021 1328   EOSABS 0.1 02/18/2021 1328   BASOSABS 0.1 02/18/2021 1328    . CMP Latest Ref Rng & Units 02/18/2021 11/19/2020 11/17/2020  Glucose 70 - 99 mg/dL 178(H) 100(H) -  BUN 8 - 23 mg/dL _0 Creatinine 0.61 - 1.24 mg/dL 0.91 0.83 0.7   Sodium 135 - 145 mmol/L 141 141 141  Potassium 3.5 - 5.1 mmol/L 4.9 3.9 4.0  Chloride 98 - 111 mmol/L 103 104 102  CO2 22 - 32 mmol/L 31 29 29(A)  Calcium 8.9 - 10.3 mg/dL 9.2 8.7(L) 8.7  Total Protein 6.5 - 8.1 g/dL 6.5 6.2(L) -  Total Bilirubin 0.3 - 1.2 mg/dL 0.5 0.6 -  Alkaline Phos 38 - 126 U/L 69 54 -  AST 15 - 41 U/L 29 16 -  ALT 0 - 44 U/L 33 28 -   03/05/2020 Left Testicular Flow Pathology Report [id number]):    03/05/2020 FISH Panel:    03/05/2020 Left Testicular Mass Surgical Pathology ([id number]):    Component     Latest Ref Rng & Units 12/19/2019  Folate, Hemolysate     Not Estab. ng/mL 390.0  HCT     37.5 - 51.0 % 40.8  Folate, RBC     >498 ng/mL 956  Vitamin B12     180 - 914 pg/mL 882   03/01/18 CBC w/diff:   RADIOGRAPHIC STUDIES: I have personally reviewed the radiological images as listed and agreed with the findings in the report. No results found.  03/05/20 of Surgical Pathology ([id number]) SURGICAL PATHOLOGY  CASE: [id number]  PATIENT: Antonio [name]  Surgical Pathology Report      Clinical History: Left testicular mass (jmc)      FINAL MICROSCOPIC DIAGNOSIS:   A. TESTICLE AND CORD, LEFT, ORCHIECTOMY:  - Diffuse large B-cell lymphoma  -See comment    COMMENT:   The sections of the testis and spermatic cord nodules show effacement of  the architecture by an atypical lymphoid infiltrate characterized by  predominance of medium and large lymphoid cells with vesicular chromatin  and small nucleoli associated with brisk mitosis.  The appearance is  primarily diffuse with lack of atypical follicles.  Variable number of  admixed smaller lymphocytes are seen. To further evaluate this process,  flow cytometric analysis was performed ([id number]) and shows a  monoclonal kappa-restricted B-cell population.  In addition, a battery  of immunohistochemical stains was performed and shows that the atypical  lymphoid cells are  positive for CD20, CD79a, PAX 5, CD10 (weak), BCL-2,  MUM-1 and cytoplasmic kappa.  No significant staining is seen with  CD138, cyclin D1, CD30, CD34, TdT, EBV in situ hybridization or  cytoplasmic lambda.  Ki67 shows variable expression ranging from 10% to  >50%.  There is an admixed T-cell population to a lesser extent as seen  with CD3 and CD5 and there is no apparent co-expression of CD5 in B-cell  areas.  The [name] features are consistent with involvement by diffuse  large B-cell lymphoma, GCB type.  The results were discussed with Dr.  Jeffie Pollock and Antonio [name] on 03/11/2020.    ASSESSMENT & PLAN:   80 y.o. male with  H/o Rt Primary testicular large B cell lymphoma in 2000 treatment with R-CHOP x 6 cycles as part of clinical trial + IT MTX x 4 cycles and RT to left testicle  2 . H/o Relapsed Left Primary testicular large B cell lymphoma 2021 stage I/IIE. Appears to be a new event since it is occurring 20 yrs after his previous rt testicular lymphoma S/p 6 cycles of R-CEOP -- currently in remission.   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 PLAN: -Discussed pt labwork today, 05/20/2021; cbc wnl, cmp unremarkable. -LDH 170 - wnl -No lab or clinical evidence of Large B-cell Lymphoma recurrence at this time.  -Recommended that the pt continue to eat well, drink at least 48-64 oz of water each day, and walk 20-30 minutes each day.  -Discussed use of Evusheld -- patient wants to think about it. -f/u with PCP  for age and medical co-morbids appropriate vaccinations. -continue f/u with Pulmonary for covid lung mx  FOLLOW UP: RTC with Dr Antonio [name] with labs in 3 months   The total time spent in the appointment was 20 minutes and more than 50% was on counseling and direct  patient cares.  All of the patient's questions were answered with apparent satisfaction. The patient knows to call the clinic with any problems, questions or concerns.    Sullivan Lone MD [place] AAHIVMS [facility] Hematology/Oncology Physician [facility]  (Office):       [phone] (Work cell):  [phone] (Fax):           [phone]    I, Reinaldo Raddle, am acting as scribe for Dr. Sullivan Lone, MD. .I have reviewed the above documentation for accuracy and completeness, and I agree with the above.  Brunetta Genera MD

## 2021-05-20 ENCOUNTER — Other Ambulatory Visit: Payer: Self-pay

## 2021-05-20 ENCOUNTER — Inpatient Hospital Stay: Payer: PPO | Attending: Hematology | Admitting: Hematology

## 2021-05-20 ENCOUNTER — Inpatient Hospital Stay: Payer: PPO

## 2021-05-20 VITALS — BP 139/86 | HR 70 | Temp 97.8°F | Resp 17 | Ht 72.0 in | Wt 189.5 lb

## 2021-05-20 DIAGNOSIS — C8339 Diffuse large B-cell lymphoma, extranodal and solid organ sites: Secondary | ICD-10-CM

## 2021-05-20 LAB — CBC WITH DIFFERENTIAL/PLATELET
Abs Immature Granulocytes: 0.02 10*3/uL (ref 0.00–0.07)
Basophils Absolute: 0 10*3/uL (ref 0.0–0.1)
Basophils Relative: 1 %
Eosinophils Absolute: 0.1 10*3/uL (ref 0.0–0.5)
Eosinophils Relative: 1 %
HCT: 43.4 % (ref 39.0–52.0)
Hemoglobin: 14.7 g/dL (ref 13.0–17.0)
Immature Granulocytes: 0 %
Lymphocytes Relative: 23 %
Lymphs Abs: 1.3 10*3/uL (ref 0.7–4.0)
MCH: 33.3 pg (ref 26.0–34.0)
MCHC: 33.9 g/dL (ref 30.0–36.0)
MCV: 98.2 fL (ref 80.0–100.0)
Monocytes Absolute: 0.6 10*3/uL (ref 0.1–1.0)
Monocytes Relative: 10 %
Neutro Abs: 3.8 10*3/uL (ref 1.7–7.7)
Neutrophils Relative %: 65 %
Platelets: 180 10*3/uL (ref 150–400)
RBC: 4.42 MIL/uL (ref 4.22–5.81)
RDW: 14.2 % (ref 11.5–15.5)
WBC: 5.8 10*3/uL (ref 4.0–10.5)
nRBC: 0 % (ref 0.0–0.2)

## 2021-05-20 LAB — CMP (CANCER CENTER ONLY)
ALT: 24 U/L (ref 0–44)
AST: 20 U/L (ref 15–41)
Albumin: 4.1 g/dL (ref 3.5–5.0)
Alkaline Phosphatase: 58 U/L (ref 38–126)
Anion gap: 10 (ref 5–15)
BUN: 13 mg/dL (ref 8–23)
CO2: 26 mmol/L (ref 22–32)
Calcium: 8.9 mg/dL (ref 8.9–10.3)
Chloride: 107 mmol/L (ref 98–111)
Creatinine: 0.82 mg/dL (ref 0.61–1.24)
GFR, Estimated: >60 mL/min (ref >60)
Glucose, Bld: 123 mg/dL — ABNORMAL HIGH (ref 70–99)
Potassium: 4.2 mmol/L (ref 3.5–5.1)
Sodium: 143 mmol/L (ref 135–145)
Total Bilirubin: 1.1 mg/dL (ref 0.3–1.2)
Total Protein: 6.4 g/dL — ABNORMAL LOW (ref 6.5–8.1)

## 2021-05-20 LAB — LACTATE DEHYDROGENASE: LDH: 153 U/L (ref 98–192)

## 2021-05-24 ENCOUNTER — Telehealth: Payer: Self-pay | Admitting: *Deleted

## 2021-05-24 MED ORDER — PROPRANOLOL HCL 10 MG PO TABS: 10.0000 mg | ORAL_TABLET | Freq: Three times a day (TID) | ORAL | 1 refills | Status: DC

## 2021-05-24 NOTE — Telephone Encounter (Signed)
Medication list updated and Rx sent to pharmacy  

## 2021-05-24 NOTE — Telephone Encounter (Signed)
Yes it is ok to refill this medication. He was on metoprolol but Dr. Steve Rattler note in May states that he is was going to try to go back on propranolol instead.

## 2021-05-24 NOTE — Telephone Encounter (Signed)
Patient called requesting a refill on his Propranolol '10mg'$  Take One tablet Three times daily.   Medication is NOT in patient's current medication list but patient states that he takes it.   Is this ok to add to the mediation list and refill.  Please Advise.

## 2021-05-29 ENCOUNTER — Encounter: Payer: Self-pay | Admitting: Hematology

## 2021-06-11 ENCOUNTER — Encounter: Payer: Self-pay | Admitting: Hematology

## 2021-06-11 DIAGNOSIS — Z20822 Contact with and (suspected) exposure to covid-19: Secondary | ICD-10-CM | POA: Diagnosis not present

## 2021-06-14 ENCOUNTER — Other Ambulatory Visit (HOSPITAL_COMMUNITY): Payer: Self-pay

## 2021-06-14 DIAGNOSIS — Z20822 Contact with and (suspected) exposure to covid-19: Secondary | ICD-10-CM | POA: Diagnosis not present

## 2021-06-15 ENCOUNTER — Other Ambulatory Visit: Payer: Self-pay

## 2021-06-15 ENCOUNTER — Inpatient Hospital Stay (HOSPITAL_BASED_OUTPATIENT_CLINIC_OR_DEPARTMENT_OTHER)
Admission: EM | Admit: 2021-06-15 | Discharge: 2021-06-21 | DRG: 871 | Disposition: A | Discharge status: Skilled Nursing Facility | Payer: PPO | Attending: Internal Medicine | Admitting: Internal Medicine

## 2021-06-15 ENCOUNTER — Emergency Department (HOSPITAL_BASED_OUTPATIENT_CLINIC_OR_DEPARTMENT_OTHER): Payer: PPO

## 2021-06-15 DIAGNOSIS — H353130 Nonexudative age-related macular degeneration, bilateral, stage unspecified: Secondary | ICD-10-CM | POA: Diagnosis present

## 2021-06-15 DIAGNOSIS — U099 Post covid-19 condition, unspecified: Secondary | ICD-10-CM | POA: Diagnosis present

## 2021-06-15 DIAGNOSIS — J189 Pneumonia, unspecified organism: Secondary | ICD-10-CM

## 2021-06-15 DIAGNOSIS — J440 Chronic obstructive pulmonary disease with acute lower respiratory infection: Secondary | ICD-10-CM | POA: Diagnosis present

## 2021-06-15 DIAGNOSIS — Z825 Family history of asthma and other chronic lower respiratory diseases: Secondary | ICD-10-CM

## 2021-06-15 DIAGNOSIS — Z961 Presence of intraocular lens: Secondary | ICD-10-CM | POA: Diagnosis present

## 2021-06-15 DIAGNOSIS — J156 Pneumonia due to other aerobic Gram-negative bacteria: Secondary | ICD-10-CM | POA: Diagnosis not present

## 2021-06-15 DIAGNOSIS — Z8572 Personal history of non-Hodgkin lymphomas: Secondary | ICD-10-CM

## 2021-06-15 DIAGNOSIS — J9601 Acute respiratory failure with hypoxia: Secondary | ICD-10-CM | POA: Diagnosis present

## 2021-06-15 DIAGNOSIS — G928 Other toxic encephalopathy: Secondary | ICD-10-CM | POA: Diagnosis not present

## 2021-06-15 DIAGNOSIS — A4189 Other specified sepsis: Secondary | ICD-10-CM | POA: Diagnosis not present

## 2021-06-15 DIAGNOSIS — J441 Chronic obstructive pulmonary disease with (acute) exacerbation: Secondary | ICD-10-CM

## 2021-06-15 DIAGNOSIS — Z79899 Other long term (current) drug therapy: Secondary | ICD-10-CM

## 2021-06-15 DIAGNOSIS — Z8249 Family history of ischemic heart disease and other diseases of the circulatory system: Secondary | ICD-10-CM | POA: Diagnosis not present

## 2021-06-15 DIAGNOSIS — I429 Cardiomyopathy, unspecified: Secondary | ICD-10-CM | POA: Diagnosis present

## 2021-06-15 DIAGNOSIS — J9621 Acute and chronic respiratory failure with hypoxia: Secondary | ICD-10-CM | POA: Diagnosis present

## 2021-06-15 DIAGNOSIS — Z8701 Personal history of pneumonia (recurrent): Secondary | ICD-10-CM

## 2021-06-15 DIAGNOSIS — R059 Cough, unspecified: Secondary | ICD-10-CM | POA: Diagnosis not present

## 2021-06-15 DIAGNOSIS — Z9842 Cataract extraction status, left eye: Secondary | ICD-10-CM

## 2021-06-15 DIAGNOSIS — F419 Anxiety disorder, unspecified: Secondary | ICD-10-CM | POA: Diagnosis present

## 2021-06-15 DIAGNOSIS — J1282 Pneumonia due to coronavirus disease 2019: Secondary | ICD-10-CM | POA: Diagnosis not present

## 2021-06-15 DIAGNOSIS — Z833 Family history of diabetes mellitus: Secondary | ICD-10-CM

## 2021-06-15 DIAGNOSIS — C6290 Malignant neoplasm of unspecified testis, unspecified whether descended or undescended: Secondary | ICD-10-CM | POA: Diagnosis present

## 2021-06-15 DIAGNOSIS — Z96651 Presence of right artificial knee joint: Secondary | ICD-10-CM | POA: Diagnosis not present

## 2021-06-15 DIAGNOSIS — Z87442 Personal history of urinary calculi: Secondary | ICD-10-CM | POA: Diagnosis not present

## 2021-06-15 DIAGNOSIS — R0902 Hypoxemia: Secondary | ICD-10-CM

## 2021-06-15 DIAGNOSIS — I10 Essential (primary) hypertension: Secondary | ICD-10-CM | POA: Diagnosis present

## 2021-06-15 DIAGNOSIS — Z85820 Personal history of malignant melanoma of skin: Secondary | ICD-10-CM

## 2021-06-15 DIAGNOSIS — Z9841 Cataract extraction status, right eye: Secondary | ICD-10-CM

## 2021-06-15 DIAGNOSIS — E785 Hyperlipidemia, unspecified: Secondary | ICD-10-CM | POA: Diagnosis not present

## 2021-06-15 DIAGNOSIS — Z83438 Family history of other disorder of lipoprotein metabolism and other lipidemia: Secondary | ICD-10-CM

## 2021-06-15 DIAGNOSIS — J9811 Atelectasis: Secondary | ICD-10-CM | POA: Diagnosis not present

## 2021-06-15 DIAGNOSIS — I447 Left bundle-branch block, unspecified: Secondary | ICD-10-CM

## 2021-06-15 DIAGNOSIS — A419 Sepsis, unspecified organism: Secondary | ICD-10-CM

## 2021-06-15 DIAGNOSIS — D472 Monoclonal gammopathy: Secondary | ICD-10-CM | POA: Diagnosis present

## 2021-06-15 DIAGNOSIS — R000 Tachycardia, unspecified: Secondary | ICD-10-CM | POA: Diagnosis not present

## 2021-06-15 DIAGNOSIS — R0602 Shortness of breath: Secondary | ICD-10-CM | POA: Diagnosis not present

## 2021-06-15 DIAGNOSIS — T380X5A Adverse effect of glucocorticoids and synthetic analogues, initial encounter: Secondary | ICD-10-CM | POA: Diagnosis present

## 2021-06-15 DIAGNOSIS — R0600 Dyspnea, unspecified: Secondary | ICD-10-CM

## 2021-06-15 DIAGNOSIS — Z91040 Latex allergy status: Secondary | ICD-10-CM

## 2021-06-15 LAB — I-STAT VENOUS BLOOD GAS, ED
Acid-Base Excess: 4 mmol/L — ABNORMAL HIGH (ref 0.0–2.0)
Bicarbonate: 31 mmol/L — ABNORMAL HIGH (ref 20.0–28.0)
Calcium, Ion: 1.19 mmol/L (ref 1.15–1.40)
HCT: 44 % (ref 39.0–52.0)
Hemoglobin: 15 g/dL (ref 13.0–17.0)
O2 Saturation: 72 %
Patient temperature: 98.5
Potassium: 3.8 mmol/L (ref 3.5–5.1)
Sodium: 133 mmol/L — ABNORMAL LOW (ref 135–145)
TCO2: 33 mmol/L — ABNORMAL HIGH (ref 22–32)
pCO2, Ven: 53.2 mmHg (ref 44.0–60.0)
pH, Ven: 7.374 (ref 7.250–7.430)
pO2, Ven: 39 mmHg (ref 32.0–45.0)

## 2021-06-15 LAB — CBC WITH DIFFERENTIAL/PLATELET
Abs Immature Granulocytes: 0 10*3/uL (ref 0.00–0.07)
Basophils Absolute: 0.1 10*3/uL (ref 0.0–0.1)
Basophils Relative: 1 %
Eosinophils Absolute: 0.1 10*3/uL (ref 0.0–0.5)
Eosinophils Relative: 1 %
HCT: 42.9 % (ref 39.0–52.0)
Hemoglobin: 14.2 g/dL (ref 13.0–17.0)
Lymphocytes Relative: 13 %
Lymphs Abs: 0.9 10*3/uL (ref 0.7–4.0)
MCH: 33.4 pg (ref 26.0–34.0)
MCHC: 33.1 g/dL (ref 30.0–36.0)
MCV: 100.9 fL — ABNORMAL HIGH (ref 80.0–100.0)
Monocytes Absolute: 1.6 10*3/uL — ABNORMAL HIGH (ref 0.1–1.0)
Monocytes Relative: 24 %
Neutro Abs: 4.1 10*3/uL (ref 1.7–7.7)
Neutrophils Relative %: 61 %
Platelets: 183 10*3/uL (ref 150–400)
RBC: 4.25 MIL/uL (ref 4.22–5.81)
RDW: 13.4 % (ref 11.5–15.5)
WBC Morphology: INCREASED
WBC: 6.7 10*3/uL (ref 4.0–10.5)
nRBC: 0 % (ref 0.0–0.2)

## 2021-06-15 LAB — COMPREHENSIVE METABOLIC PANEL
ALT: 71 U/L — ABNORMAL HIGH (ref 0–44)
AST: 47 U/L — ABNORMAL HIGH (ref 15–41)
Albumin: 3.8 g/dL (ref 3.5–5.0)
Alkaline Phosphatase: 60 U/L (ref 38–126)
Anion gap: 11 (ref 5–15)
BUN: 13 mg/dL (ref 8–23)
CO2: 28 mmol/L (ref 22–32)
Calcium: 9.1 mg/dL (ref 8.9–10.3)
Chloride: 96 mmol/L — ABNORMAL LOW (ref 98–111)
Creatinine, Ser: 0.6 mg/dL — ABNORMAL LOW (ref 0.61–1.24)
GFR, Estimated: >60 mL/min (ref >60)
Glucose, Bld: 128 mg/dL — ABNORMAL HIGH (ref 70–99)
Potassium: 3.9 mmol/L (ref 3.5–5.1)
Sodium: 135 mmol/L (ref 135–145)
Total Bilirubin: 0.9 mg/dL (ref 0.3–1.2)
Total Protein: 7 g/dL (ref 6.5–8.1)

## 2021-06-15 LAB — BRAIN NATRIURETIC PEPTIDE: B Natriuretic Peptide: 66.3 pg/mL (ref 0.0–100.0)

## 2021-06-15 LAB — RESP PANEL BY RT-PCR (FLU A&B, COVID) ARPGX2
Influenza A by PCR: NEGATIVE
Influenza B by PCR: NEGATIVE
SARS Coronavirus 2 by RT PCR: NEGATIVE

## 2021-06-15 LAB — TROPONIN I (HIGH SENSITIVITY): Troponin I (High Sensitivity): 10 ng/L (ref <18)

## 2021-06-15 LAB — LACTIC ACID, PLASMA
Lactic Acid, Venous: 2.2 mmol/L (ref 0.5–1.9)
Lactic Acid, Venous: 4 mmol/L (ref 0.5–1.9)

## 2021-06-15 MED ORDER — SODIUM CHLORIDE 0.9 % IV BOLUS
500.0000 mL | Freq: Once | INTRAVENOUS | Status: AC
  Administered 2021-06-15: 500 mL via INTRAVENOUS

## 2021-06-15 MED ORDER — IPRATROPIUM BROMIDE 0.02 % IN SOLN
0.5000 mg | Freq: Once | RESPIRATORY_TRACT | Status: AC
  Administered 2021-06-15: 0.5 mg via RESPIRATORY_TRACT
  Filled 2021-06-15: qty 2.5

## 2021-06-15 MED ORDER — CHLORHEXIDINE GLUCONATE CLOTH 2 % EX PADS
6.0000 | MEDICATED_PAD | Freq: Every day | CUTANEOUS | Status: DC
  Administered 2021-06-16 – 2021-06-21 (×5): 6 via TOPICAL

## 2021-06-15 MED ORDER — ALBUTEROL SULFATE (2.5 MG/3ML) 0.083% IN NEBU
5.0000 mg | INHALATION_SOLUTION | Freq: Once | RESPIRATORY_TRACT | Status: AC
  Administered 2021-06-15: 5 mg via RESPIRATORY_TRACT
  Filled 2021-06-15: qty 6

## 2021-06-15 MED ORDER — SODIUM CHLORIDE 0.9 % IV SOLN
100.0000 mg | Freq: Two times a day (BID) | INTRAVENOUS | Status: DC
  Administered 2021-06-16 (×3): 100 mg via INTRAVENOUS
  Filled 2021-06-15 (×4): qty 100

## 2021-06-15 MED ORDER — SODIUM CHLORIDE 0.9 % IV SOLN: INTRAVENOUS | Status: DC

## 2021-06-15 MED ORDER — SODIUM CHLORIDE 0.9 % IV SOLN: 500.0000 mg | Freq: Once | INTRAVENOUS | Status: DC

## 2021-06-15 MED ORDER — CHLORHEXIDINE GLUCONATE 0.12 % MT SOLN
15.0000 mL | Freq: Two times a day (BID) | OROMUCOSAL | Status: DC
  Administered 2021-06-15 – 2021-06-21 (×12): 15 mL via OROMUCOSAL
  Filled 2021-06-15 (×10): qty 15

## 2021-06-15 MED ORDER — SODIUM CHLORIDE 0.9 % IV SOLN
1.0000 g | Freq: Once | INTRAVENOUS | Status: AC
  Administered 2021-06-16: 1 g via INTRAVENOUS
  Filled 2021-06-15: qty 1

## 2021-06-15 MED ORDER — ORAL CARE MOUTH RINSE
15.0000 mL | Freq: Two times a day (BID) | OROMUCOSAL | Status: DC
  Administered 2021-06-16 – 2021-06-20 (×9): 15 mL via OROMUCOSAL

## 2021-06-15 MED ORDER — IOHEXOL 350 MG/ML SOLN
75.0000 mL | Freq: Once | INTRAVENOUS | Status: AC | PRN
  Administered 2021-06-15: 75 mL via INTRAVENOUS

## 2021-06-15 MED ORDER — METHYLPREDNISOLONE SODIUM SUCC 125 MG IJ SOLR
125.0000 mg | Freq: Once | INTRAMUSCULAR | Status: AC
  Administered 2021-06-15: 125 mg via INTRAVENOUS
  Filled 2021-06-15: qty 2

## 2021-06-15 NOTE — ED Notes (Signed)
Edit time. Patient placed on Bipap at [place]. See note.

## 2021-06-15 NOTE — Progress Notes (Signed)
Verbal Order to D/C Azithromycin order, obtain EKG and troponin from admitting MD B. Chonter.

## 2021-06-15 NOTE — ED Triage Notes (Signed)
Pt arrives POV, with wife, started having shortness of breath on Friday, 8/26.  Per wife, low sats at home, using home O2 at home 2-3 liters, sats were 94-96%. Has been coughing yellowish green phlegm since Friday.  Per wife, he had a fever of 101.6.  Home Covid test negative.

## 2021-06-15 NOTE — ED Provider Notes (Signed)
[place] EMERGENCY DEPT Provider Note   CSN: [id number] Arrival date & time: 06/15/21  1545     History Chief Complaint  Patient presents with   Shortness of Breath    Antonio [name] is a 80 y.o. male.  Patient with history of skin cancer, high blood pressure, lymphoma here 2000, lipids, MGUS presents with worsening shortness of breath, cough productive, congestion and feeling generally unwell.  Symptoms worsened over the past 5 days.  Patient was admitted in January for 16 days for COVID-pneumonia.  Patient has shortness of breath no chest pain.  Feels generally weak.      Past Medical History:  Diagnosis Date   Age-related macular degeneration, dry, both eyes    B-cell lymphoma 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   ED (erectile dysfunction)    Elevated diaphragm    right   Erectile dysfunction    History of basal cell carcinoma (BCC) excision    multiple excision in office   History of kidney stones    History of seizure    03-03-2020 per pt x1 while taking interferan for melanoma in 2001 ,  none since, told a side effect of interferan   History of squamous cell carcinoma excision    multiple skin excision in office   Hyperlipidemia    Hypertension    followed by pcp  (03-03-2020 per pt had a stress test approx. 2010, told normal)   LBBB (left bundle branch block)    Lymphoma of testis ([place]) 2000   s/p right orchiectomy    Mass of left testis    MGUS (monoclonal gammopathy of unknown significance)    oncologist--- dr Irene Limbo   Mixed hyperlipidemia 10/26/2020   Personal history of malignant melanoma of skin dermatologist--- dr Wilhemina Bonito   dx 2001 left calf area  s/p WLE ,  completed one year interferan (03-03-2020 pt states did not  involve lymph nodes, no recurrence)   Urgency of urination    Wears hearing aid in both ears     Patient Active Problem List   Diagnosis Date Noted   Chronic respiratory failure with hypoxia ([place]) 12/16/2020   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 12/16/2020   Post-COVID chronic neurologic symptoms 12/16/2020   Lobar pneumonia ([place]) 10/27/2020   Acute hypoxemic respiratory failure due to COVID-19 ([place]) 10/26/2020   Mixed hyperlipidemia [id number]   Acute metabolic encephalopathy [id number]   Symptomatic anemia 10/20/2020   Acute respiratory failure with hypoxia ([place]) 10/20/2020   Pleural effusion 10/20/2020   Testosterone deficiency 09/09/2020   S/P orchiectomy 09/09/2020   Diffuse large B cell lymphoma ([place]) 03/17/2020   Testicular malignant neoplasm ([place]) 03/17/2020   Counseling regarding advance care planning and goals of care 03/17/2020   Hyperglycemia 09/05/2018   Mass of soft tissue of left lower extremity 09/05/2018   Monoclonal gammopathy of unknown significance (MGUS) 09/05/2018   Macrocytosis without anemia 09/05/2018   Macular degeneration of both eyes 06/21/2017   Slow transit constipation 06/21/2017   Sleep-wake disorder 06/21/2017   Essential hypertension 06/21/2017   Pure hypercholesterolemia 06/21/2017   History of B-cell lymphoma 06/21/2017   Personal history of malignant melanoma 06/21/2017   Left carotid artery stenosis 06/21/2017   DDD (degenerative disc disease), cervical 06/21/2017   History of kidney stones  06/21/2017   Tubular adenoma of colon 06/21/2017   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   Vitamin D deficiency 06/21/2017    Past Surgical History:  Procedure Laterality Date   CATARACT EXTRACTION W/ INTRAOCULAR LENS IMPLANT Bilateral 2018   IR IMAGING GUIDED PORT INSERTION  03/31/2020   MELANOMA EXCISION  10/1999   ORCHIECTOMY Right 2000   ORCHIECTOMY Left 03/05/2020   Procedure: ORCHIECTOMY;  Surgeon: [name], MD;  Location:  [facility];  Service: Urology;  Laterality: Left;   TONSILLECTOMY  child   TOTAL KNEE ARTHROPLASTY Right 06/2009       Family History  Problem Relation Age of Onset   Cataracts Mother    Transient ischemic attack Mother    Asthma Mother    Cataracts Father    Diabetes Father        borderline   Hypertension Father    Hyperlipidemia Father    Hyperlipidemia Son    Hypertension Son    Amblyopia Neg Hx    Blindness Neg Hx    Glaucoma Neg Hx    Macular degeneration Neg Hx    Retinal detachment Neg Hx    Strabismus Neg Hx    Retinitis pigmentosa Neg Hx     Social History   Tobacco Use   Smoking status: Never   Smokeless tobacco: Never  Vaping Use   Vaping Use: Never used  Substance Use Topics   Alcohol use: Yes    Alcohol/week: 14.0 standard drinks    Types: 14 Glasses of wine per week    Comment: 2 wine daily   Drug use: Never    Home Medications Prior to Admission medications   Medication Sig Start Date End Date Taking? Authorizing Provider  albuterol (VENTOLIN HFA) 108 (90 Base) MCG/ACT inhaler Inhale 2 puffs into the lungs every 6 (six) hours as needed for shortness of breath. 10/19/20   Reed, Tiffany L, DO  Cholecalciferol 100 MCG (4000 UT) CAPS Take 1 capsule (4,000 Units total) by mouth daily. 10/19/20   Reed, Tiffany L, DO  fluticasone (FLONASE) 50 MCG/ACT nasal spray Place 2 sprays into both nostrils daily. 12/16/20   Reed, Tiffany L, DO  lactulose (CHRONULAC) 10 GM/15ML solution Take 15 mLs (10 g total) by mouth daily. Patient not taking: Reported on 04/29/2021 11/11/20   Swayze, Ava, DO  losartan (COZAAR) 100 MG tablet TAKE 1 TABLET BY MOUTH EVERY DAY 12/31/20   Reed, Tiffany L, DO  meloxicam (MOBIC) 7.5 MG tablet Take 1 tablet (7.5 mg total) by mouth daily. 12/02/20   Reed, Tiffany L, DO  Multiple Vitamins-Minerals (PRESERVISION AREDS 2 PO) Take 1 capsule by mouth 2 (two) times daily.     [provider]  mupirocin 2% oint-hydrocortisone  2.5% cream-nystatin cream-zinc oxide 13% oint 1:1:1:5 mixture Apply topically 2 (two) times daily as needed for rash, itching or irritation. 12/02/20   Reed, Tiffany L, DO  mupirocin ointment (BACTROBAN) 2 % Apply topically. 12/02/20   [provider]  nystatin (NYSTATIN) powder Apply 1 application topically 4 (four) times daily. Apply to groin area for yeast infection until resolved Patient not taking: Reported on 04/29/2021 12/02/20   Mariea Clonts, Tiffany L, DO  polyethylene glycol (MIRALAX / GLYCOLAX) 17 g packet Take 17 g by mouth daily. Patient not taking: Reported on 04/29/2021 11/11/20   Swayze, Ava, DO  propranolol (INDERAL) 10 MG tablet Take 1 tablet (10 mg total) by mouth 3 (three) times daily. 05/24/21   Royal Hawthorn, NP  simvastatin (ZOCOR) 40 MG tablet TAKE 1 TABLET BY MOUTH EVERY DAY 12/17/20   Reed, Tiffany L, DO  Testosterone 20.25 MG/ACT (1.62%) GEL Apply 40.5 mg topically daily. 03/23/20   [provider]  valACYclovir (VALTREX) 1000 MG tablet Take 1000 (1 grams) PO 2 times daily for 3 days as needed for flares (herpes)    [provider]    Allergies    Latex  Review of Systems   Review of Systems  Constitutional:  Positive for appetite change and fever. Negative for chills.  HENT:  Positive for congestion.   Eyes:  Negative for visual disturbance.  Respiratory:  Positive for cough and shortness of breath.   Cardiovascular:  Negative for chest pain.  Gastrointestinal:  Negative for abdominal pain and vomiting.  Genitourinary:  Negative for dysuria and flank pain.  Musculoskeletal:  Negative for back pain, neck pain and neck stiffness.  Skin:  Negative for rash.  Neurological:  Negative for light-headedness and headaches.   Physical Exam Updated Vital Signs BP 127/76   Pulse (!) 124   Temp 98.2 F (36.8 C) (Oral)   Resp (!) 25   Ht 6' (1.829 m)   Wt 86.2 kg   SpO2 95%   BMI 25.77 kg/m   Physical Exam Vitals and nursing note reviewed.   Constitutional:      General: He is not in acute distress.    Appearance: He is well-developed.  HENT:     Head: Normocephalic and atraumatic.     Comments: Dry mucous membranes, nasal congestion    Mouth/Throat:     Mouth: Mucous membranes are moist.  Eyes:     General:        Right eye: No discharge.        Left eye: No discharge.     Conjunctiva/sclera: Conjunctivae normal.  Neck:     Trachea: No tracheal deviation.  Cardiovascular:     Rate and Rhythm: Regular rhythm. Tachycardia present.     Heart sounds: No murmur heard. Pulmonary:     Effort: Tachypnea present.     Breath sounds: Wheezing and rhonchi present.  Abdominal:     General: There is no distension.     Palpations: Abdomen is soft.     Tenderness: There is no abdominal tenderness. There is no guarding.  Musculoskeletal:     Cervical back: Normal range of motion and neck supple. No rigidity.     Right lower leg: No edema.     Left lower leg: No edema.  Skin:    General: Skin is warm.     Capillary Refill: Capillary refill takes less than 2 seconds.     Findings: No rash.  Neurological:     General: No focal deficit present.     Mental Status: He is alert.     Cranial Nerves: No cranial nerve deficit.  Psychiatric:        Mood and Affect: Mood normal.    ED Results / Procedures / Treatments   Labs (all labs ordered are listed, but only abnormal results are displayed) Labs Reviewed  COMPREHENSIVE METABOLIC PANEL - Abnormal; Notable for the following components:      Result Value   Chloride 96 (*)    Glucose, Bld 128 (*)    Creatinine, Ser 0.60 (*)    AST 47 (*)    ALT 71 (*)    All other components within normal limits  CBC WITH DIFFERENTIAL/PLATELET - Abnormal; Notable for the following  components:   MCV 100.9 (*)    Monocytes Absolute 1.6 (*)    All other components within normal limits  LACTIC ACID, PLASMA - Abnormal; Notable for the following components:   Lactic Acid, Venous 2.2 (*)    All  other components within normal limits  I-STAT VENOUS BLOOD GAS, ED - Abnormal; Notable for the following components:   Bicarbonate 31.0 (*)    TCO2 33 (*)    Acid-Base Excess 4.0 (*)    Sodium 133 (*)    All other components within normal limits  RESP PANEL BY RT-PCR (FLU A&B, COVID) ARPGX2  BRAIN NATRIURETIC PEPTIDE  LACTIC ACID, PLASMA  TROPONIN I (HIGH SENSITIVITY)    EKG EKG Interpretation  Date/Time:  Tuesday June 15 2021 18:38:39 EDT Ventricular Rate:  126 PR Interval:  137 QRS Duration: 150 QT Interval:  339 QTC Calculation: 491 R Axis:   -67 Text Interpretation: Sinus tachycardia Probable left atrial enlargement Left bundle branch block Confirmed by Elnora Morrison [phone]) on 06/15/2021 7:22:35 PM  Radiology CT Angio Chest PE W and/or Wo Contrast  Result Date: 06/15/2021 CLINICAL DATA:  An 80 year old male with hypoxia and shortness of breath. Cough since Friday. EXAM: CT ANGIOGRAPHY CHEST WITH CONTRAST TECHNIQUE: Multidetector CT imaging of the chest was performed using the standard protocol during bolus administration of intravenous contrast. Multiplanar CT image reconstructions and MIPs were obtained to evaluate the vascular anatomy. CONTRAST:  75m OMNIPAQUE IOHEXOL 350 MG/ML SOLN COMPARISON:  January 13, 2021. FINDINGS: Cardiovascular: RIGHT-sided Port-A-Cath terminating at the caval to atrial junction. Aorta of normal caliber with scattered atheromatous plaque. Mild cardiac enlargement without pericardial effusion of substantial volume. Enlargement of pulmonary vasculature up to 3.6 cm, potentially increased from the previous exam. No signs of acute pulmonary embolism. Limited assessment beyond the lobar level in most cases particularly in the lower lobes and mid chest, visualized segmental branches without definite signs of embolism. Mediastinum/Nodes: Esophagus grossly normal. Thoracic inlet structures are normal. No axillary adenopathy. No mediastinal adenopathy.  No hilar  adenopathy. Lungs/Pleura: Bandlike consolidative process, subpleural changes in the RIGHT lung with slight worsening since the previous exam. Bandlike subpleural changes in the LEFT peripheral chest in the upper lobe are stable. Worsening of septal thickening but now with frank consolidative process at the LEFT base in the LEFT lower lobe when compared to the prior study. Juxta diaphragmatic atelectasis with elevated RIGHT hemidiaphragm with similar appearance to prior imaging. No pneumothorax. No pleural effusion. Upper Abdomen: Hepatic steatosis. Elevated RIGHT hemidiaphragm. Liver is incompletely imaged. Imaged portions of pancreas, spleen, RIGHT and LEFT kidney without acute findings. Presumed cyst of the RIGHT kidney is unchanged. No acute gastrointestinal process on limited assessment. Musculoskeletal: Spinal degenerative changes. Signs of prior trauma to the RIGHT hemithorax. Healed rib fractures along the RIGHT chest as before. No acute musculoskeletal process. Review of the MIP images confirms the above findings. IMPRESSION: No signs of pulmonary embolism at the central or lobar level and in most visualized segmental branches but with limited assessment particularly in the mid and lower chest due to respiratory motion. 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 Given findings of post inflammatory fibrosis on the previous study would suggest follow-up to ensure there is no worsening of baseline changes. Persistent elevation of the RIGHT hemidiaphragm. Correlate with any history of diaphragmatic dysfunction Enlargement of pulmonary vasculature up to 3.6 cm, potentially increased from the previous exam. Correlate with any clinical  signs of pulmonary arterial hypertension. Hepatic steatosis. Aortic Atherosclerosis (ICD10-I70.0). Electronically Signed   By: Zetta Bills M.D.   On: 06/15/2021 19:42   DG Chest Portable 1 View  Result  Date: 06/15/2021 CLINICAL DATA:  Shortness of breath. EXAM: PORTABLE CHEST 1 VIEW COMPARISON:  November 02, 2020 FINDINGS: There is stable right-sided venous Port-A-Cath positioning. Moderate severity right basilar linear scarring and/or atelectasis is seen. There is no evidence of a pleural effusion or pneumothorax. Marked severity elevation of the right hemidiaphragm is seen and unchanged in severity. The heart size and mediastinal contours are within normal limits. Degenerative changes seen throughout the thoracic spine. IMPRESSION: Marked severity elevation of the right hemidiaphragm with moderate severity right basilar linear scarring and/or atelectasis. Electronically Signed   By: Virgina Norfolk M.D.   On: 06/15/2021 18:22    Procedures .Critical Care  Date/Time: 06/15/2021 9:49 PM Performed by: Elnora Morrison, MD Authorized by: Elnora Morrison, MD   Critical care provider statement:    Critical care time (minutes):  80   Critical care start time:  06/15/2021 8:00 PM   Critical care end time:  06/15/2021 9:20 PM   Critical care time was exclusive of:  Separately billable procedures and treating other patients and teaching time   Critical care was necessary to treat or prevent imminent or life-threatening deterioration of the following conditions:  Respiratory failure   Critical care was time spent personally by me on the following activities:  Discussions with consultants, evaluation of patient's response to treatment, examination of patient, ordering and performing treatments and interventions, ordering and review of laboratory studies, ordering and review of radiographic studies, pulse oximetry, re-evaluation of patient's condition, obtaining history from patient or surrogate and review of old charts   Medications Ordered in ED Medications  cefTRIAXone (ROCEPHIN) 1 g in sodium chloride 0.9 % 100 mL IVPB (has no administration in time range)  azithromycin (ZITHROMAX) 500 mg in sodium  chloride 0.9 % 250 mL IVPB (has no administration in time range)  0.9 %  sodium chloride infusion (has no administration in time range)  sodium chloride 0.9 % bolus 500 mL (0 mLs Intravenous Stopped 06/15/21 1800)  methylPREDNISolone sodium succinate (SOLU-MEDROL) 125 mg/2 mL injection 125 mg (125 mg Intravenous Given 06/15/21 1646)  albuterol (PROVENTIL) (2.5 MG/3ML) 0.083% nebulizer solution 5 mg (5 mg Nebulization Given 06/15/21 1644)  ipratropium (ATROVENT) nebulizer solution 0.5 mg (0.5 mg Nebulization Given 06/15/21 1644)  albuterol (PROVENTIL) (2.5 MG/3ML) 0.083% nebulizer solution 5 mg (5 mg Nebulization Given 06/15/21 1807)  ipratropium (ATROVENT) nebulizer solution 0.5 mg (0.5 mg Nebulization Given 06/15/21 1807)  sodium chloride 0.9 % bolus 500 mL (500 mLs Intravenous New Bag/Given 06/15/21 1832)  iohexol (OMNIPAQUE) 350 MG/ML injection 75 mL (75 mLs Intravenous Contrast Given 06/15/21 1906)  albuterol (PROVENTIL) (2.5 MG/3ML) 0.083% nebulizer solution 5 mg (5 mg Nebulization Given 06/15/21 1939)  ipratropium (ATROVENT) nebulizer solution 0.5 mg (0.5 mg Nebulization Given 06/15/21 1939)    ED Course  I have reviewed the triage vital signs and the nursing notes.  Pertinent labs & imaging results that were available during my care of the patient were reviewed by me and considered in my medical decision making (see chart for details).    MDM Rules/Calculators/A&P                           Patient with history of COVID and on albuterol for possible COPD/post-COVID lung issues presents with worsening  shortness of breath and respiratory symptoms.  Concern clinically for COVID versus other viral/bacterial pneumonia, shortness of breath and tachycardia plan for chest x-ray, IV fluid bolus, blood work and EKG. Patient 84% on room air, improved with 3 L nasal cannula.  Solu-Medrol and albuterol ordered. Patient gradually required 4 L then 5 L nasal cannula.  Patient had minimal improvement with  nebulizers, third set ordered.  Blood work ordered reviewed normal white blood cell count, hemoglobin, electrolytes unremarkable.  Lactic acid mild elevated in the twos.  CT scan ordered with persistent tachycardia, EKG reviewed left bundle branch block faster rate than previous EKG that was left bundle branch block as well.  CT scan results showed possible pneumonia, inflammation.   CT neg for PE.  Antibiotics cover community acquired pneumonia ordered.  Patient put on trial of BiPAP to help with work of breathing. Discussed with hospitalist for admission to intermediate level care.  PLEAS NEWLON was evaluated in Emergency Department on 06/15/2021 for the symptoms described in the history of present illness. He was evaluated in the context of the global COVID-19 pandemic, which necessitated consideration that the patient might be at risk for infection with the SARS-CoV-2 virus that causes COVID-19. Institutional protocols and algorithms that pertain to the evaluation of patients at risk for COVID-19 are in a state of rapid change based on information released by regulatory bodies including the CDC and federal and state organizations. These policies and algorithms were followed during the patient's care in the ED.   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

## 2021-06-15 NOTE — ED Notes (Signed)
FiO2 titrated to 35%. Patient tolerating BiPap well at this time. RR 27, SpO2 100%, HR 126.

## 2021-06-15 NOTE — ED Notes (Signed)
To CT scan with RN on monitor.  Continues to have labored breathing,  Wheezing and rales noted. Heart rate increases into the 140s with any physical effort.

## 2021-06-15 NOTE — ED Notes (Signed)
Handoff report given to Child psychotherapist at [employer] [employer]

## 2021-06-15 NOTE — ED Notes (Signed)
Patient placed on Bipap at this time as ordered. Pt placed on 12/5 60%. Will titrated FiO2 as able.

## 2021-06-15 NOTE — ED Notes (Signed)
[facility] at bedside Handoff report given 

## 2021-06-16 ENCOUNTER — Encounter (HOSPITAL_COMMUNITY): Payer: Self-pay | Admitting: Family Medicine

## 2021-06-16 DIAGNOSIS — A419 Sepsis, unspecified organism: Secondary | ICD-10-CM

## 2021-06-16 DIAGNOSIS — J441 Chronic obstructive pulmonary disease with (acute) exacerbation: Secondary | ICD-10-CM

## 2021-06-16 DIAGNOSIS — I447 Left bundle-branch block, unspecified: Secondary | ICD-10-CM

## 2021-06-16 DIAGNOSIS — J189 Pneumonia, unspecified organism: Secondary | ICD-10-CM

## 2021-06-16 LAB — CBC
HCT: 44.1 % (ref 39.0–52.0)
Hemoglobin: 14.1 g/dL (ref 13.0–17.0)
MCH: 33.3 pg (ref 26.0–34.0)
MCHC: 32 g/dL (ref 30.0–36.0)
MCV: 104.3 fL — ABNORMAL HIGH (ref 80.0–100.0)
Platelets: 208 10*3/uL (ref 150–400)
RBC: 4.23 MIL/uL (ref 4.22–5.81)
RDW: 14 % (ref 11.5–15.5)
WBC: 9.3 10*3/uL (ref 4.0–10.5)
nRBC: 0 % (ref 0.0–0.2)

## 2021-06-16 LAB — BASIC METABOLIC PANEL
Anion gap: 10 (ref 5–15)
BUN: 11 mg/dL (ref 8–23)
CO2: 28 mmol/L (ref 22–32)
Calcium: 8.9 mg/dL (ref 8.9–10.3)
Chloride: 101 mmol/L (ref 98–111)
Creatinine, Ser: 0.68 mg/dL (ref 0.61–1.24)
GFR, Estimated: >60 mL/min (ref >60)
Glucose, Bld: 148 mg/dL — ABNORMAL HIGH (ref 70–99)
Potassium: 4 mmol/L (ref 3.5–5.1)
Sodium: 139 mmol/L (ref 135–145)

## 2021-06-16 LAB — GLUCOSE, CAPILLARY: Glucose-Capillary: 172 mg/dL — ABNORMAL HIGH (ref 70–99)

## 2021-06-16 LAB — TROPONIN I (HIGH SENSITIVITY)
Troponin I (High Sensitivity): 14 ng/L (ref <18)
Troponin I (High Sensitivity): 12 ng/L (ref <18)

## 2021-06-16 LAB — LACTIC ACID, PLASMA
Lactic Acid, Venous: 2.4 mmol/L (ref 0.5–1.9)
Lactic Acid, Venous: 5.5 mmol/L (ref 0.5–1.9)
Lactic Acid, Venous: 3 mmol/L (ref 0.5–1.9)

## 2021-06-16 LAB — STREP PNEUMONIAE URINARY ANTIGEN: Strep Pneumo Urinary Antigen: NEGATIVE

## 2021-06-16 LAB — MRSA NEXT GEN BY PCR, NASAL: MRSA by PCR Next Gen: NOT DETECTED

## 2021-06-16 MED ORDER — ACETAMINOPHEN 325 MG PO TABS: 650.0000 mg | ORAL_TABLET | Freq: Four times a day (QID) | ORAL | Status: DC | PRN

## 2021-06-16 MED ORDER — SODIUM CHLORIDE 0.9 % IV SOLN
2.0000 g | INTRAVENOUS | Status: DC
  Administered 2021-06-16 – 2021-06-21 (×6): 2 g via INTRAVENOUS
  Filled 2021-06-16 (×3): qty 20
  Filled 2021-06-16 (×2): qty 2
  Filled 2021-06-16: qty 20

## 2021-06-16 MED ORDER — SODIUM CHLORIDE 0.9% FLUSH
10.0000 mL | INTRAVENOUS | Status: DC | PRN
  Administered 2021-06-21: 10 mL

## 2021-06-16 MED ORDER — BUDESONIDE 0.25 MG/2ML IN SUSP
0.2500 mg | Freq: Two times a day (BID) | RESPIRATORY_TRACT | Status: DC
  Administered 2021-06-16 – 2021-06-21 (×10): 0.25 mg via RESPIRATORY_TRACT
  Filled 2021-06-16 (×10): qty 2

## 2021-06-16 MED ORDER — SODIUM CHLORIDE 0.9% FLUSH
10.0000 mL | Freq: Two times a day (BID) | INTRAVENOUS | Status: DC
  Administered 2021-06-16: 20 mL
  Administered 2021-06-16 – 2021-06-18 (×5): 10 mL
  Administered 2021-06-19: 20 mL
  Administered 2021-06-20 (×2): 10 mL

## 2021-06-16 MED ORDER — METHYLPREDNISOLONE SODIUM SUCC 40 MG IJ SOLR: 40.0000 mg | Freq: Two times a day (BID) | INTRAMUSCULAR | Status: DC

## 2021-06-16 MED ORDER — FOLIC ACID 1 MG PO TABS
1.0000 mg | ORAL_TABLET | Freq: Every day | ORAL | Status: DC
  Administered 2021-06-16 – 2021-06-21 (×6): 1 mg via ORAL
  Filled 2021-06-16 (×6): qty 1

## 2021-06-16 MED ORDER — LACTATED RINGERS IV SOLN: INTRAVENOUS | Status: DC

## 2021-06-16 MED ORDER — LEVALBUTEROL HCL 1.25 MG/0.5ML IN NEBU
1.2500 mg | INHALATION_SOLUTION | Freq: Four times a day (QID) | RESPIRATORY_TRACT | Status: DC
  Administered 2021-06-16: 1.25 mg via RESPIRATORY_TRACT
  Filled 2021-06-16: qty 0.5

## 2021-06-16 MED ORDER — ALBUTEROL SULFATE HFA 108 (90 BASE) MCG/ACT IN AERS: 2.0000 | INHALATION_SPRAY | RESPIRATORY_TRACT | Status: DC | PRN

## 2021-06-16 MED ORDER — SODIUM CHLORIDE 0.9 % IV SOLN
INTRAVENOUS | Status: DC | PRN
  Administered 2021-06-16: 500 mL via INTRAVENOUS

## 2021-06-16 MED ORDER — SODIUM CHLORIDE 0.9 % IV SOLN: 1.0000 g | INTRAVENOUS | Status: DC

## 2021-06-16 MED ORDER — ACETAMINOPHEN 650 MG RE SUPP: 650.0000 mg | Freq: Four times a day (QID) | RECTAL | Status: DC | PRN

## 2021-06-16 MED ORDER — ENOXAPARIN SODIUM 40 MG/0.4ML IJ SOSY
40.0000 mg | PREFILLED_SYRINGE | INTRAMUSCULAR | Status: DC
  Administered 2021-06-16 – 2021-06-21 (×6): 40 mg via SUBCUTANEOUS
  Filled 2021-06-16 (×6): qty 0.4

## 2021-06-16 MED ORDER — LACTATED RINGERS IV BOLUS
1000.0000 mL | Freq: Once | INTRAVENOUS | Status: AC
  Administered 2021-06-16: 1000 mL via INTRAVENOUS

## 2021-06-16 MED ORDER — THIAMINE HCL 100 MG/ML IJ SOLN
100.0000 mg | Freq: Every day | INTRAMUSCULAR | Status: DC
  Administered 2021-06-17: 100 mg via INTRAVENOUS
  Filled 2021-06-16: qty 2

## 2021-06-16 MED ORDER — FLUMAZENIL 0.5 MG/5ML IV SOLN: 0.5000 mg | Freq: Once | INTRAVENOUS | Status: AC

## 2021-06-16 MED ORDER — DEXMEDETOMIDINE HCL IN NACL 200 MCG/50ML IV SOLN
0.1000 ug/kg/h | INTRAVENOUS | Status: AC
  Administered 2021-06-16 – 2021-06-17 (×2): 0.1 ug/kg/h via INTRAVENOUS
  Administered 2021-06-17: 0.3 ug/kg/h via INTRAVENOUS
  Administered 2021-06-18: 0.2 ug/kg/h via INTRAVENOUS
  Administered 2021-06-18: 0.5 ug/kg/h via INTRAVENOUS
  Administered 2021-06-19: 0.3 ug/kg/h via INTRAVENOUS
  Filled 2021-06-16 (×6): qty 50

## 2021-06-16 MED ORDER — PROPRANOLOL HCL 10 MG PO TABS
10.0000 mg | ORAL_TABLET | Freq: Three times a day (TID) | ORAL | Status: DC
  Administered 2021-06-16 – 2021-06-21 (×15): 10 mg via ORAL
  Filled 2021-06-16 (×17): qty 1

## 2021-06-16 MED ORDER — LORAZEPAM 2 MG/ML IJ SOLN
1.0000 mg | INTRAMUSCULAR | Status: DC | PRN
  Administered 2021-06-16: 2 mg via INTRAVENOUS
  Filled 2021-06-16: qty 1

## 2021-06-16 MED ORDER — ALBUTEROL SULFATE (2.5 MG/3ML) 0.083% IN NEBU
2.5000 mg | INHALATION_SOLUTION | RESPIRATORY_TRACT | Status: DC | PRN
  Administered 2021-06-18: 2.5 mg via RESPIRATORY_TRACT
  Filled 2021-06-16: qty 3

## 2021-06-16 MED ORDER — ADULT MULTIVITAMIN W/MINERALS CH
1.0000 | ORAL_TABLET | Freq: Every day | ORAL | Status: DC
  Administered 2021-06-16 – 2021-06-21 (×6): 1 via ORAL
  Filled 2021-06-16 (×6): qty 1

## 2021-06-16 MED ORDER — METHYLPREDNISOLONE SODIUM SUCC 125 MG IJ SOLR
80.0000 mg | INTRAMUSCULAR | Status: DC
  Administered 2021-06-16 – 2021-06-18 (×3): 80 mg via INTRAVENOUS
  Filled 2021-06-16 (×3): qty 2

## 2021-06-16 MED ORDER — LORAZEPAM 1 MG PO TABS: 1.0000 mg | ORAL_TABLET | ORAL | Status: DC | PRN

## 2021-06-16 MED ORDER — LEVALBUTEROL HCL 1.25 MG/0.5ML IN NEBU
1.2500 mg | INHALATION_SOLUTION | Freq: Three times a day (TID) | RESPIRATORY_TRACT | Status: DC
  Administered 2021-06-17 – 2021-06-18 (×4): 1.25 mg via RESPIRATORY_TRACT
  Filled 2021-06-16 (×4): qty 0.5

## 2021-06-16 MED ORDER — THIAMINE HCL 100 MG PO TABS
100.0000 mg | ORAL_TABLET | Freq: Every day | ORAL | Status: DC
  Administered 2021-06-16 – 2021-06-21 (×5): 100 mg via ORAL
  Filled 2021-06-16 (×5): qty 1

## 2021-06-16 MED ORDER — FLUMAZENIL 0.5 MG/5ML IV SOLN
INTRAVENOUS | Status: AC
  Administered 2021-06-16: 0.5 mg via INTRAVENOUS
  Filled 2021-06-16: qty 5

## 2021-06-16 MED ORDER — LOSARTAN POTASSIUM 50 MG PO TABS
100.0000 mg | ORAL_TABLET | Freq: Every day | ORAL | Status: DC
  Administered 2021-06-16 – 2021-06-21 (×6): 100 mg via ORAL
  Filled 2021-06-16 (×6): qty 2

## 2021-06-16 NOTE — Consult Note (Signed)
NAME:  Antonio [name], MRN:  [id number], DOB:  02-07-1941, LOS: 1 ADMISSION DATE:  06/15/2021, CONSULTATION DATE:  06/16/21 REFERRING MD:  Sloan Leiter, CHIEF COMPLAINT:  shortness of breath   History of Present Illness:   Mr. Ulin is a 80 y.o. 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  Pt reports that after covid, he was eventually able to return to ADL's without difficulty and was mostly off oxygen, was lifting weights several times per week.   History lists COPD, however pt denies history of this, was not a smoker and does not use inhalers at home.   In the ED, pt required nasal cannula O2, CT chest without PE but notable for post-inflammatory fibrosis and consolidative process in the LLL.   He was admitted and started on ceftriaxone and doxycycline and PCCM consulted.  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   Significant Hospital Events: Including procedures, antibiotic start and stop dates in addition to other pertinent events   8/31 Admit to hospitalists,  on [place], PCCM consult  Interim History / Subjective:  Pt with persistent coughing and dyspnea, but no severe distress  Objective   Blood pressure (!) 155/89, pulse (!) 121, temperature 98.4 F (36.9 C), temperature source Axillary, resp. rate (!) 28, height 6' (1.829 m), weight 86.2 kg, SpO2 97 %.    FiO2 (%):  [6 %-44 %] 40 %   Intake/Output Summary (Last 24 hours) at 06/16/2021 1302 Last data filed at 06/16/2021 1031 Gross per 24 hour  Intake 1281.47 ml  Output 600 ml  Net 681.47 ml   Filed Weights   06/15/21 1616  Weight: 86.2 kg    General:  elderly M, alert, well-nourished, sitting up in bed HEENT: MM pink/moist, sclera anicteric Neuro: awake, oriented x4 and conversational, moving all extremities CV: s1s2 rrr, no m/r/g PULM:  expiratory wheezing bilaterally in all lung fields, tachypneic, no accessory muscle use and not in severe distress GI: soft, bsx4 active  Extremities: warm/dry, no edema  Skin: no rashes or lesions   Resolved Hospital Problem list     Assessment & Plan:     Acute on chronic hypoxic respiratory failure secondary to PNA in the setting of prior Covid-19 infection and post-inflammatory fibrosis  Currently on [address] No PE on CT P: -Agree with rocephin and doxycyline, IVF, respiratory cultures, systemic steroids -add inhaled steroids -Bipap prn -IS and Flutter valve -in the setting of post-inflammatory changes from Covid and new infection is at higher risk of complicated course, we will continue to follow with you    Best Practice (right click and "Reselect all SmartList Selections" daily)   Per primary  Labs   CBC: Recent Labs  Lab 06/15/21  1639 06/15/21 1650 06/16/21 0623  WBC 6.7  --  9.3  NEUTROABS 4.1  --   --   HGB 14.2 15.0 14.1  HCT 42.9 44.0 44.1  MCV 100.9*  --  104.3*  PLT 183  --  [id number]    Basic Metabolic Panel: Recent Labs  Lab 06/15/21 1639 06/15/21 1650 06/16/21 0623  NA 135 133* 139  K 3.9 3.8 4.0  CL 96*  --   101  CO2 28  --  28  GLUCOSE 128*  --  148*  BUN 13  --  11  CREATININE 0.60*  --  0.68  CALCIUM 9.1  --  8.9   GFR: Estimated Creatinine Clearance: 78.1 mL/min (by C-G formula based on SCr of 0.68 mg/dL). Recent Labs  Lab 06/15/21 1639 06/15/21 2008 06/15/21 2309 06/16/21 0623 06/16/21 0630 06/16/21 0914  WBC 6.7  --   --  9.3  --   --   LATICACIDVEN  --  2.2* 4.0*  --  2.4* 5.5*    Liver Function Tests: Recent Labs  Lab 06/15/21 1639  AST 47*  ALT 71*  ALKPHOS 60  BILITOT 0.9  PROT 7.0  ALBUMIN 3.8   No results for input(s): LIPASE, AMYLASE in the last 168 hours. No results for input(s): AMMONIA in the last 168 hours.  ABG    Component Value Date/Time   PHART 7.474 (H) 10/26/2020 1540   PCO2ART 40.2 10/26/2020 1540   PO2ART 58 (L) 10/26/2020 1540   HCO3 31.0 (H) 06/15/2021 1650   TCO2 33 (H) 06/15/2021 1650   O2SAT 72.0 06/15/2021 1650     Coagulation Profile: No results for input(s): INR, PROTIME in the last 168 hours.  Cardiac Enzymes: No results for input(s): CKTOTAL, CKMB, CKMBINDEX, TROPONINI in the last 168 hours.  HbA1C: Hemoglobin A1C  Date/Time Value Ref Range Status  02/27/2020 08:00 AM 5.7  Final  02/26/2019 12:00 AM 5.6  Final    CBG: No results for input(s): GLUCAP in the last 168 hours.  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   Surgical History:   Past Surgical History:  Procedure Laterality Date   CATARACT EXTRACTION W/ INTRAOCULAR LENS IMPLANT Bilateral 2018   IR IMAGING GUIDED PORT INSERTION  03/31/2020   MELANOMA EXCISION  10/1999   ORCHIECTOMY Right 2000   ORCHIECTOMY Left 03/05/2020   Procedure: ORCHIECTOMY;  Surgeon: [name], MD;  Location: [facility];  Service: Urology;  Laterality: Left;   TONSILLECTOMY  child   TOTAL KNEE ARTHROPLASTY Right 06/2009     Social History:   reports that he has never smoked. He has never used smokeless tobacco. He reports current alcohol use of about 14.0 standard drinks per week. He reports that he does not use drugs.   Family History:  His family history includes Asthma in his mother; Cataracts in his  father and mother; Diabetes in his father; Hyperlipidemia in his father and son; Hypertension in his father and son; Transient ischemic attack in his mother. There is no history of Amblyopia, Blindness, Glaucoma, Macular degeneration, Retinal detachment, Strabismus, or Retinitis pigmentosa.   Allergies Allergies  Allergen Reactions   Latex Itching    Latex tape causes itching and redness.     Home Medications  Prior to Admission medications   Medication Sig Start Date End Date Taking? Authorizing Provider  albuterol (VENTOLIN HFA) 108 (90 Base) MCG/ACT inhaler Inhale 2 puffs into the lungs every 6 (six) hours as needed for shortness of breath. 10/19/20  Yes Reed, Tiffany L, DO  Black Pepper-Turmeric (TURMERIC COMPLEX/BLACK PEPPER PO) Take 1,000 mg by mouth daily at 12 noon.   Yes [provider]  carboxymethylcellulose (REFRESH PLUS) 0.5 % SOLN Place 1 drop into both eyes daily as needed.   Yes [provider]  Cholecalciferol 100 MCG (4000 UT) CAPS Take 1 capsule (4,000 Units total)  by mouth daily. 10/19/20  Yes Reed, Tiffany L, DO  fluticasone (FLONASE) 50 MCG/ACT nasal spray Place 2 sprays into both nostrils daily. 12/16/20  Yes Reed, Tiffany L, DO  losartan (COZAAR) 100 MG tablet TAKE 1 TABLET BY MOUTH EVERY DAY 12/31/20  Yes Reed, Tiffany L, DO  meloxicam (MOBIC) 7.5 MG tablet Take 1 tablet (7.5 mg total) by mouth daily. 12/02/20  Yes Reed, Tiffany L, DO  Multiple Vitamins-Minerals (PRESERVISION AREDS 2 PO) Take 1 capsule by mouth 2 (two) times daily.    Yes [provider]  propranolol (INDERAL) 10 MG tablet Take 1 tablet (10 mg total) by mouth 3 (three) times daily. 05/24/21  Yes Wert, Margreta Journey, NP  simvastatin (ZOCOR) 40 MG tablet TAKE 1 TABLET BY MOUTH EVERY DAY 12/17/20  Yes Reed, Tiffany L, DO  Testosterone 20.25 MG/ACT (1.62%) GEL Apply 40.5 mg topically daily. 03/23/20  Yes [provider]  valACYclovir (VALTREX) 1000 MG tablet Take 1000 (1 grams) PO 2 times daily for 3 days as needed for flares (herpes)   Yes [provider]  vitamin B-12 (CYANOCOBALAMIN) 1000 MCG tablet Take 1,000 mcg by mouth daily.   Yes [provider]  lactulose (CHRONULAC) 10 GM/15ML solution Take 15 mLs (10 g total) by mouth daily. Patient not taking: No sig reported 11/11/20   Swayze, Ava, DO  mupirocin 2% oint-hydrocortisone 2.5% cream-nystatin cream-zinc oxide 13% oint 1:1:1:5 mixture Apply topically 2 (two) times daily as needed for rash, itching or irritation. Patient not taking: No sig reported 12/02/20   Mariea Clonts, Tiffany L, DO  mupirocin ointment (BACTROBAN) 2 % Apply topically. Patient not taking: No sig reported 12/02/20   [provider]  nystatin (NYSTATIN) powder Apply 1 application topically 4 (four) times daily. Apply to groin area for yeast infection until resolved Patient not taking: No sig reported 12/02/20   Reed, Tiffany L, DO  polyethylene glycol (MIRALAX / GLYCOLAX) 17 g packet Take 17 g by mouth daily. Patient not taking: No sig reported  11/11/20   Karie Kirks, DO     Critical care time: n/a   Otilio Carpen Bhavin Monjaraz, PA-C [facility] See Amion for pager If no response to pager , please call [phone] until 7pm After 7:00 pm call Elink  [phone]?[place]

## 2021-06-16 NOTE — Plan of Care (Signed)
  Problem: Education: Goal: Knowledge of General Education information will improve Description Including pain rating scale, medication(s)/side effects and non-pharmacologic comfort measures Outcome: Progressing   

## 2021-06-16 NOTE — Progress Notes (Signed)
PROGRESS NOTE    Antonio [name]  [id number] DOB: May 24, 1941 DOA: 06/15/2021 PCP: [name], MD    Brief Narrative:  80 year old male with history of B-cell lymphoma, hypertension COPD and hyperlipidemia, COVID-19 infection with prolonged hospitalization in January 2022 presented with shortness of breath and cough worse for last 5 days.  Reported temperature 101 at home.  In the emergency room, tachycardic and tachypneic.  Blood pressure stable.  84% on room air.  Placed on BiPAP.  CTA showed no PE, lactic acid 4, CT scan with bilateral upper lobe fibrosis superimposed pneumonia.   Assessment & Plan:   Principal Problem:   Multifocal pneumonia Active Problems:   Essential hypertension   History of B-cell lymphoma   Acute respiratory failure with hypoxia (HCC)   Acute exacerbation of chronic obstructive pulmonary disease (COPD) (HCC)   Sepsis (HCC)   LBBB (left bundle branch block)  Multifocal pneumonia: Acute hypoxemic respiratory failure secondary to multifocal pneumonia and chronic lung disease due to COVID-19 infection. Patient likely has gram-positive and gram-negative pneumonia.   Antibiotics to treat bacterial pneumonia with doxycycline and Rocephin.  We will continue. Chest physiotherapy, incentive spirometry, deep breathing exercises, sputum induction, mucolytic's and bronchodilators. Albuterol as needed. Levo albuterol every 6 hours to avoid tachycardia. Sputum cultures, blood cultures, Legionella and streptococcal antigen. Speech therapy evaluation. Supplemental oxygen to keep saturations more than 90%.  BiPAP for respiratory distress. 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  Sepsis present on admission:Sepsis present as the patient has 2 of the following  Lactic acid 4 RR >20 And suspect his source of infections is pneumonia Stabilizing.  On maintenance IV fluids.  Will discontinue when he is able to take by  mouth.  Essential hypertension: Blood pressure stable on losartan and Inderal.  Continue propanolol to avoid tachycardia.  COPD: As above.  Chronic left bundle branch block: Stable.   DVT prophylaxis: enoxaparin (LOVENOX) injection 40 mg Start: 06/16/21 1000   Code Status: Full code Family Communication: Wife on the phone Disposition Plan: Status is: Inpatient  Remains inpatient appropriate because:Inpatient level of care appropriate due to severity of illness  Dispo: The patient is from: Home              Anticipated d/c is to: Home              Patient currently is not medically stable to d/c.   Difficult to place patient No         Consultants:  None.  Procedures:  None  Antimicrobials:  Anti-infectives (From admission, onward)    Start     Dose/Rate Route Frequency Ordered Stop   06/16/21 2200  cefTRIAXone (ROCEPHIN) 1 g in sodium chloride 0.9 % 100 mL IVPB        1 g 200 mL/hr over 30 Minutes Intravenous Every 24 hours 06/16/21 0038     06/16/21 0000  doxycycline (VIBRAMYCIN) 100 mg in sodium chloride 0.9 % 250 mL IVPB        100 mg 125 mL/hr over 120 Minutes Intravenous 2 times daily 06/15/21 2348     06/15/21 2145  cefTRIAXone (ROCEPHIN) 1 g in sodium chloride 0.9 % 100 mL IVPB        1 g 200 mL/hr over 30 Minutes Intravenous  Once 06/15/21 2144 06/16/21 0031   06/15/21 2145  azithromycin (ZITHROMAX) 500 mg in sodium chloride 0.9 % 250 mL IVPB  Status:  Discontinued  500 mg 250 mL/hr over 60 Minutes Intravenous  Once 06/15/21 2144 06/15/21 2330          Subjective: Patient seen and examined.  Remained on BiPAP overnight.  I removed his BiPAP and needed 3 to 4 L of oxygen.  He was able to talk with a raspy voice.  He tells me that he feels slightly better today.  He has cough but not able to expectorate any sputum.  Objective: Vitals:   06/16/21 0400 06/16/21 0500 06/16/21 0700 06/16/21 0800  BP: (!) 142/76 (!) 153/87 137/82 135/85  Pulse: 95  (!) 104  (!) 109  Resp: (!) 21 (!) 23  (!) 23  Temp:    98.4 F (36.9 C)  TempSrc:    Axillary  SpO2: 95% 97%  96%  Weight:      Height:        Intake/Output Summary (Last 24 hours) at 06/16/2021 0830 Last data filed at 06/16/2021 0700 Gross per 24 hour  Intake 1261.47 ml  Output 600 ml  Net 661.47 ml   Filed Weights   06/15/21 1616  Weight: 86.2 kg    Examination:  General exam: Appears calm, in moderate distress.  Sick looking. On 3 L oxygen now. Respiratory system: Mostly upper airway conducted sounds.  Occasional expiratory crackles. Cardiovascular system: S1 & S2 heard, tachycardia.   Gastrointestinal system: Abdomen is nondistended, soft and nontender. No organomegaly or masses felt. Normal bowel sounds heard. Central nervous system: Alert and oriented. No focal neurological deficits. Extremities: Symmetric 5 x 5 power. Skin: No rashes, lesions or ulcers Psychiatry: Judgement and insight appear normal. Mood & affect anxious.    Data Reviewed: I have personally reviewed following labs and imaging studies  CBC: Recent Labs  Lab 06/15/21 1639 06/15/21 1650 06/16/21 0623  WBC 6.7  --  9.3  NEUTROABS 4.1  --   --   HGB 14.2 15.0 14.1  HCT 42.9 44.0 44.1  MCV 100.9*  --  104.3*  PLT 183  --  [id number]   Basic Metabolic Panel: Recent Labs  Lab 06/15/21 1639 06/15/21 1650 06/16/21 0623  NA 135 133* 139  K 3.9 3.8 4.0  CL 96*  --  101  CO2 28  --  28  GLUCOSE 128*  --  148*  BUN 13  --  11  CREATININE 0.60*  --  0.68  CALCIUM 9.1  --  8.9   GFR: Estimated Creatinine Clearance: 78.1 mL/min (by C-G formula based on SCr of 0.68 mg/dL). Liver Function Tests: Recent Labs  Lab 06/15/21 1639  AST 47*  ALT 71*  ALKPHOS 60  BILITOT 0.9  PROT 7.0  ALBUMIN 3.8   No results for input(s): LIPASE, AMYLASE in the last 168 hours. No results for input(s): AMMONIA in the last 168 hours. Coagulation Profile: No results for input(s): INR, PROTIME in the last 168  hours. Cardiac Enzymes: No results for input(s): CKTOTAL, CKMB, CKMBINDEX, TROPONINI in the last 168 hours. BNP (last 3 results) No results for input(s): PROBNP in the last 8760 hours. HbA1C: No results for input(s): HGBA1C in the last 72 hours. CBG: No results for input(s): GLUCAP in the last 168 hours. Lipid Profile: No results for input(s): CHOL, HDL, LDLCALC, TRIG, CHOLHDL, LDLDIRECT in the last 72 hours. Thyroid Function Tests: No results for input(s): TSH, T4TOTAL, FREET4, T3FREE, THYROIDAB in the last 72 hours. Anemia Panel: No results for input(s): VITAMINB12, FOLATE, FERRITIN, TIBC, IRON, RETICCTPCT in the last 72 hours. Sepsis  Labs: Recent Labs  Lab 06/15/21 2008 06/15/21 2309 06/16/21 0630  LATICACIDVEN 2.2* 4.0* 2.4*    Recent Results (from the past 240 hour(s))  Resp Panel by RT-PCR (Flu A&B, Covid) Nasopharyngeal Swab     Status: None   Collection Time: 06/15/21  4:39 PM   Specimen: Nasopharyngeal Swab; Nasopharyngeal(NP) swabs in vial transport medium  Result Value Ref Range Status   SARS Coronavirus 2 by RT PCR NEGATIVE NEGATIVE Final    Comment: (NOTE) SARS-CoV-2 target nucleic acids are NOT DETECTED.  The SARS-CoV-2 RNA is generally detectable in upper respiratory specimens during the acute phase of infection. The lowest concentration of SARS-CoV-2 viral copies this assay can detect is 138 copies/mL. A negative result does not preclude SARS-Cov-2 infection and should not be used as the sole basis for treatment or other patient management decisions. A negative result may occur with  improper specimen collection/handling, submission of specimen other than nasopharyngeal swab, presence of viral mutation(s) within the areas targeted by this assay, and inadequate number of viral copies(<138 copies/mL). A negative result must be combined with clinical observations, patient history, and epidemiological information. The expected result is Negative.  Fact Sheet  for Patients:  EntrepreneurPulse.com.au  Fact Sheet for Healthcare Providers:  IncredibleEmployment.be  This test is no t yet approved or cleared by the [place] FDA and  has been authorized for detection and/or diagnosis of SARS-CoV-2 by FDA under an Emergency Use Authorization (EUA). This EUA will remain  in effect (meaning this test can be used) for the duration of the COVID-19 declaration under Section 564(b)(1) of the Act, 21 U.S.C.section 360bbb-3(b)(1), unless the authorization is terminated  or revoked sooner.       Influenza A by PCR NEGATIVE NEGATIVE Final   Influenza B by PCR NEGATIVE NEGATIVE Final    Comment: (NOTE) The Xpert Xpress SARS-CoV-2/FLU/RSV plus assay is intended as an aid in the diagnosis of influenza from Nasopharyngeal swab specimens and should not be used as a sole basis for treatment. Nasal washings and aspirates are unacceptable for Xpert Xpress SARS-CoV-2/FLU/RSV testing.  Fact Sheet for Patients: EntrepreneurPulse.com.au  Fact Sheet for Healthcare Providers: IncredibleEmployment.be  This test is not yet approved or cleared by the [place] FDA and has been authorized for detection and/or diagnosis of SARS-CoV-2 by FDA under an Emergency Use Authorization (EUA). This EUA will remain in effect (meaning this test can be used) for the duration of the COVID-19 declaration under Section 564(b)(1) of the Act, 21 U.S.C. section 360bbb-3(b)(1), unless the authorization is terminated or revoked.  Performed at [facility], [address] [place], [place] [address]   MRSA Next Gen by PCR, Nasal     Status: None   Collection Time: 06/15/21 11:43 PM   Specimen: Nasal Mucosa; Nasal Swab  Result Value Ref Range Status   MRSA by PCR Next Gen NOT DETECTED NOT DETECTED Final    Comment: (NOTE) The GeneXpert MRSA Assay (FDA approved for NASAL specimens  only), is one component of a comprehensive MRSA colonization surveillance program. It is not intended to diagnose MRSA infection nor to guide or monitor treatment for MRSA infections. Test performance is not FDA approved in patients less than 9 years old. Performed at [facility] [place] [address] [place], [place] [address]          Radiology Studies: CT Angio Chest PE W and/or Wo Contrast  Result Date: 06/15/2021 CLINICAL DATA:  An 80 year old male with hypoxia and shortness of breath.  Cough since Friday. EXAM: CT ANGIOGRAPHY CHEST WITH CONTRAST TECHNIQUE: Multidetector CT imaging of the chest was performed using the standard protocol during bolus administration of intravenous contrast. Multiplanar CT image reconstructions and MIPs were obtained to evaluate the vascular anatomy. CONTRAST:  23m OMNIPAQUE IOHEXOL 350 MG/ML SOLN COMPARISON:  January 13, 2021. FINDINGS: Cardiovascular: RIGHT-sided Port-A-Cath terminating at the caval to atrial junction. Aorta of normal caliber with scattered atheromatous plaque. Mild cardiac enlargement without pericardial effusion of substantial volume. Enlargement of pulmonary vasculature up to 3.6 cm, potentially increased from the previous exam. No signs of acute pulmonary embolism. Limited assessment beyond the lobar level in most cases particularly in the lower lobes and mid chest, visualized segmental branches without definite signs of embolism. Mediastinum/Nodes: Esophagus grossly normal. Thoracic inlet structures are normal. No axillary adenopathy. No mediastinal adenopathy.  No hilar adenopathy. Lungs/Pleura: Bandlike consolidative process, subpleural changes in the RIGHT lung with slight worsening since the previous exam. Bandlike subpleural changes in the LEFT peripheral chest in the upper lobe are stable. Worsening of septal thickening but now with frank consolidative process at the LEFT base in the LEFT lower lobe when compared to the  prior study. Juxta diaphragmatic atelectasis with elevated RIGHT hemidiaphragm with similar appearance to prior imaging. No pneumothorax. No pleural effusion. Upper Abdomen: Hepatic steatosis. Elevated RIGHT hemidiaphragm. Liver is incompletely imaged. Imaged portions of pancreas, spleen, RIGHT and LEFT kidney without acute findings. Presumed cyst of the RIGHT kidney is unchanged. No acute gastrointestinal process on limited assessment. Musculoskeletal: Spinal degenerative changes. Signs of prior trauma to the RIGHT hemithorax. Healed rib fractures along the RIGHT chest as before. No acute musculoskeletal process. Review of the MIP images confirms the above findings. IMPRESSION: No signs of pulmonary embolism at the central or lobar level and in most visualized segmental branches but with limited assessment particularly in the mid and lower chest due to respiratory motion. 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 Given findings of post inflammatory fibrosis on the previous study would suggest follow-up to ensure there is no worsening of baseline changes. Persistent elevation of the RIGHT hemidiaphragm. Correlate with any history of diaphragmatic dysfunction Enlargement of pulmonary vasculature up to 3.6 cm, potentially increased from the previous exam. Correlate with any clinical signs of pulmonary arterial hypertension. Hepatic steatosis. Aortic Atherosclerosis (ICD10-I70.0). Electronically Signed   By: GZetta BillsM.D.   On: 06/15/2021 19:42   DG Chest Portable 1 View  Result Date: 06/15/2021 CLINICAL DATA:  Shortness of breath. EXAM: PORTABLE CHEST 1 VIEW COMPARISON:  November 02, 2020 FINDINGS: There is stable right-sided venous Port-A-Cath positioning. Moderate severity right basilar linear scarring and/or atelectasis is seen. There is no evidence of a pleural effusion or pneumothorax. Marked severity elevation of the right  hemidiaphragm is seen and unchanged in severity. The heart size and mediastinal contours are within normal limits. Degenerative changes seen throughout the thoracic spine. IMPRESSION: Marked severity elevation of the right hemidiaphragm with moderate severity right basilar linear scarring and/or atelectasis. Electronically Signed   By: TVirgina NorfolkM.D.   On: 06/15/2021 18:22        Scheduled Meds:  chlorhexidine  15 mL Mouth Rinse BID   Chlorhexidine Gluconate Cloth  6 each Topical Daily   enoxaparin (LOVENOX) injection  40 mg Subcutaneous Q24H   levalbuterol  1.25 mg Nebulization Q6H   losartan  100 mg Oral Daily   mouth rinse  15 mL Mouth Rinse  [id number]   methylPREDNISolone (SOLU-MEDROL) injection  40 mg Intravenous Q12H   propranolol  10 mg Oral TID   Continuous Infusions:  cefTRIAXone (ROCEPHIN)  IV     doxycycline (VIBRAMYCIN) IV Stopped (06/16/21 0244)   lactated ringers 75 mL/hr at 06/16/21 0700     LOS: 1 day    Time spent: Additional 30 minutes    Barb Merino, MD [facility] Pager [phone]

## 2021-06-16 NOTE — Progress Notes (Signed)
Pt given '2mg'$  Ativan based on CIWA scale.  Pt became lethargic, unresponsive and O2 saturations noted to be 82% on nasal cannula.  RN placed pt on NRB, O2 recovered to 90%.  RN paged MD and contacted RT.  Placed pt on Bipap, Flumazenil administered as ordered by MD at bedside. Pt responsive, A&O x 3, remains on Bipap.  RN will continue to carefully monitor.

## 2021-06-16 NOTE — H&P (Signed)
History and Physical    Antonio [name] [id number] DOB: 1940-12-15 DOA: 06/15/2021  PCP: Antonio Dad, MD   Patient coming from: Home via Antonio [name]  Chief Complaint: Shortness of breath, fever  HPI: Antonio [name] is a 80 y.o. 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  He reports symptoms have been progressive getting worse over the last 5 days.  Last night he developed a fever to 101 degrees and took some Tylenol at home.  He had progressively worsening shortness of breath that is exacerbated by exertion.  He has a cough that is productive of a sputum but he has not looked at the color of it.  He states he just does not generally feel well.  He has had decreased appetite but no nausea vomiting or diarrhea.  He states he feels weak and has low energy level.  He has not had any chest pain, syncope, abdominal pain, urinary symptoms.  He did have COVID in January was admitted for 16 days.  He has had multiple COVID test at home and in the emergency room last night that have all been negative.  ED Course: Mr. Antonio [name] has had tachycardia and tachypnea in the emergency room but no hypotension.  He has had hypoxia down to 84% on room air.  He was placed on BiPAP.  Found to have bilateral upper lobe pneumonia on CT scan with no PE.  Lactic acid was 2.2 and increased to 4.0.  Initial troponin was 10.  BNP 66.3.  Electrolytes AST mildly elevated 47 ALT of 71.  Creatinine 0.60.  CBC was unremarkable.  Hospitalist service was asked to admit patient for further management  Review of Systems:  General: Reports fever, chills. Denies weight loss, night sweats.  Denies dizziness. HENT: Denies head trauma, headache, denies change in hearing, tinnitus.  Denies nasal congestion.  Denies sore throat.  Denies difficulty swallowing Eyes: Denies blurry vision, pain in eye,  drainage.  Denies discoloration of eyes. Neck: Denies pain.  Denies swelling.  Denies pain with movement. Cardiovascular: Denies chest pain, palpitations.  Denies edema.  Denies orthopnea Respiratory: Reports shortness of breath, cough.  Reports wheezing.  Denies sputum production Gastrointestinal: Denies abdominal pain, swelling.  Denies nausea, vomiting, diarrhea.  Denies melena.  Denies hematemesis. Musculoskeletal: Denies limitation of movement.  Denies deformity or swelling.  Denies pain.  Denies arthralgias or myalgias. Genitourinary: Denies pelvic pain.  Denies urinary frequency or hesitancy.  Denies dysuria.  Skin: Denies rash.  Denies petechiae, purpura, ecchymosis. Neurological: Denies syncope.  Denies seizure activity.  Denies paresthesia.  Denies slurred speech, drooping face.  Denies visual change. Psychiatric: Denies depression, anxiety. Denies hallucinations.  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   ED (erectile dysfunction)    Elevated diaphragm    right   Erectile dysfunction    History of basal cell carcinoma (BCC) excision    multiple excision in office   History of kidney stones    History of seizure    03-03-2020 per pt x1 while taking interferan for melanoma in 2001 ,  none  since, told a side effect of interferan   History of squamous cell carcinoma excision    multiple skin excision in office   Hyperlipidemia    Hypertension    followed by pcp  (03-03-2020 per pt had a stress test approx. 2010, told normal)   LBBB (left bundle branch block)    Lymphoma of testis ([place]) 2000   s/p right orchiectomy    Mass of left testis    MGUS (monoclonal  gammopathy of unknown significance)    oncologist--- dr Antonio [name]   Mixed hyperlipidemia 10/26/2020   Personal history of malignant melanoma of skin dermatologist--- dr Antonio [name]   dx 2001 left calf area  s/p WLE ,  completed one year interferan (03-03-2020 pt states did not involve lymph nodes, no recurrence)   Urgency of urination    Wears hearing aid in both ears     Past Surgical History:  Procedure Laterality Date   CATARACT EXTRACTION W/ INTRAOCULAR LENS IMPLANT Bilateral 2018   IR IMAGING GUIDED PORT INSERTION  03/31/2020   MELANOMA EXCISION  10/1999   ORCHIECTOMY Right 2000   ORCHIECTOMY Left 03/05/2020   Procedure: ORCHIECTOMY;  Surgeon: Antonio Seal, MD;  Location: [facility];  Service: Urology;  Laterality: Left;   TONSILLECTOMY  child   TOTAL KNEE ARTHROPLASTY Right 06/2009    Social History  reports that he has never smoked. He has never used smokeless tobacco. He reports current alcohol use of about 14.0 standard drinks per week. He reports that he does not use drugs.  Allergies  Allergen Reactions   Latex Itching    Latex tape causes itching and redness.    Family History  Problem Relation Age of Onset   Cataracts Mother    Transient ischemic attack Mother    Asthma Mother    Cataracts Father    Diabetes Father        borderline   Hypertension Father    Hyperlipidemia Father    Hyperlipidemia Son    Hypertension Son    Amblyopia Neg Hx    Blindness Neg Hx    Glaucoma Neg Hx    Macular degeneration Neg Hx    Retinal detachment Neg Hx    Strabismus Neg Hx    Retinitis pigmentosa Neg Hx      Prior to Admission medications   Medication Sig Start Date End Date Taking? Authorizing Provider  albuterol (VENTOLIN HFA) 108 (90 Base) MCG/ACT inhaler Inhale 2 puffs into the lungs every 6 (six) hours as needed for shortness of breath. 10/19/20   [name], Antonio L, DO  Cholecalciferol 100 MCG (4000 UT) CAPS Take 1 capsule (4,000 Units total) by mouth daily.  10/19/20   [name], Antonio L, DO  fluticasone (FLONASE) 50 MCG/ACT nasal spray Place 2 sprays into both nostrils daily. 12/16/20   [name], Antonio L, DO  lactulose (CHRONULAC) 10 GM/15ML solution Take 15 mLs (10 g total) by mouth daily. Patient not taking: Reported on 04/29/2021 11/11/20   Swayze, Ava, DO  losartan (COZAAR) 100 MG tablet TAKE 1 TABLET BY MOUTH EVERY DAY 12/31/20   [name], Antonio L, DO  meloxicam (MOBIC) 7.5 MG tablet Take 1 tablet (7.5 mg total) by mouth daily. Patient not taking: Reported on 06/16/2021 12/02/20   Hollace Kinnier L, DO  Multiple Vitamins-Minerals (PRESERVISION AREDS 2 PO) Take 1 capsule by mouth 2 (two) times daily.     [provider]  mupirocin 2% oint-hydrocortisone 2.5% cream-nystatin cream-zinc oxide 13% oint 1:1:1:5 mixture  Apply topically 2 (two) times daily as needed for rash, itching or irritation. 12/02/20   [name], Antonio L, DO  mupirocin ointment (BACTROBAN) 2 % Apply topically. 12/02/20   [provider]  nystatin (NYSTATIN) powder Apply 1 application topically 4 (four) times daily. Apply to groin area for yeast infection until resolved Patient not taking: Reported on 04/29/2021 12/02/20   Mariea Clonts, Antonio L, DO  polyethylene glycol (MIRALAX / GLYCOLAX) 17 g packet Take 17 g by mouth daily. Patient not taking: No sig reported 11/11/20   Swayze, Ava, DO  propranolol (INDERAL) 10 MG tablet Take 1 tablet (10 mg total) by mouth 3 (three) times daily. 05/24/21   Royal Hawthorn, NP  simvastatin (ZOCOR) 40 MG tablet TAKE 1 TABLET BY MOUTH EVERY DAY Patient not taking: Reported on 06/16/2021 12/17/20   Hollace Kinnier L, DO  Testosterone 20.25 MG/ACT (1.62%) GEL Apply 40.5 mg topically daily. 03/23/20   [provider]  valACYclovir (VALTREX) 1000 MG tablet Take 1000 (1 grams) PO 2 times daily for 3 days as needed for flares (herpes)    [provider]    Physical Exam: Vitals:   06/15/21 2100 06/15/21 2300 06/15/21 2309 06/16/21 0000  BP: 127/76    139/82  Pulse: (!) 124  (!) 126 (!) 114  Resp: (!) 25  (!) 28 (!) 23  Temp:  98.4 F (36.9 C)    TempSrc:  Oral    SpO2: 95%  99% 96%  Weight:      Height:        Constitutional: NAD, calm, comfortable Vitals:   06/15/21 2100 06/15/21 2300 06/15/21 2309 06/16/21 0000  BP: 127/76   139/82  Pulse: (!) 124  (!) 126 (!) 114  Resp: (!) 25  (!) 28 (!) 23  Temp:  98.4 F (36.9 C)    TempSrc:  Oral    SpO2: 95%  99% 96%  Weight:      Height:       General: WDWN, Alert and oriented x3.  Eyes: EOMI, PERRL, conjunctivae normal.  Sclera nonicteric HENT:  [place]/AT, external ears normal.  Nares patent without epistasis.  BiPAP mask in good position.  No lesions of external mouth. Neck: Soft, normal range of motion, supple, no masses,Trachea midline Respiratory: Tachypnea.  Diminished breath sounds with diffuse scattered Rales and expiratory wheezing.  Rhonchi in upper lobes bilaterally.  No wheezing, no crackles. Normal respiratory effort. No accessory muscle use.  Cardiovascular: Regular rhythm, tachycardia.  No murmurs / rubs / gallops. No extremity edema. Abdomen: Soft, no tenderness, nondistended, no rebound or guarding.  No masses palpated. Bowel sounds normoactive Musculoskeletal: FROM. no cyanosis. Normal muscle tone.  Skin: Warm, dry, intact no rashes, lesions, ulcers. No induration Neurologic: CN 2-12 grossly intact.  Normal speech.  Sensation intact. Strength 5/5 in all extremities.   Psychiatric: Normal judgment and insight.  Normal mood.    Labs on Admission: I have personally reviewed following labs and imaging studies  CBC: Recent Labs  Lab 06/15/21 1639 06/15/21 1650  WBC 6.7  --   NEUTROABS 4.1  --   HGB 14.2 15.0  HCT 42.9 44.0  MCV 100.9*  --   PLT 183  --     Basic Metabolic Panel: Recent Labs  Lab 06/15/21 1639 06/15/21 1650  NA 135 133*  K 3.9 3.8  CL 96*  --   CO2 28  --   GLUCOSE 128*  --   BUN 13  --   CREATININE  0.60*  --   CALCIUM 9.1  --      GFR: Estimated Creatinine Clearance: 78.1 mL/min (A) (by C-G formula based on SCr of 0.6 mg/dL (L)).  Liver Function Tests: Recent Labs  Lab 06/15/21 1639  AST 47*  ALT 71*  ALKPHOS 60  BILITOT 0.9  PROT 7.0  ALBUMIN 3.8    Urine analysis:    Component Value Date/Time   COLORURINE YELLOW 10/26/2020 1501   APPEARANCEUR CLEAR 10/26/2020 1501   LABSPEC 1.026 10/26/2020 1501   PHURINE 6.0 10/26/2020 1501   GLUCOSEU NEGATIVE 10/26/2020 1501   HGBUR NEGATIVE 10/26/2020 1501   BILIRUBINUR NEGATIVE 10/26/2020 1501   KETONESUR NEGATIVE 10/26/2020 1501   PROTEINUR NEGATIVE 10/26/2020 1501   NITRITE NEGATIVE 10/26/2020 1501   LEUKOCYTESUR NEGATIVE 10/26/2020 1501    Radiological Exams on Admission: CT Angio Chest PE W and/or Wo Contrast  Result Date: 06/15/2021 CLINICAL DATA:  An 80 year old male with hypoxia and shortness of breath. Cough since Friday. EXAM: CT ANGIOGRAPHY CHEST WITH CONTRAST TECHNIQUE: Multidetector CT imaging of the chest was performed using the standard protocol during bolus administration of intravenous contrast. Multiplanar CT image reconstructions and MIPs were obtained to evaluate the vascular anatomy. CONTRAST:  26m OMNIPAQUE IOHEXOL 350 MG/ML SOLN COMPARISON:  January 13, 2021. FINDINGS: Cardiovascular: RIGHT-sided Port-A-Cath terminating at the caval to atrial junction. Aorta of normal caliber with scattered atheromatous plaque. Mild cardiac enlargement without pericardial effusion of substantial volume. Enlargement of pulmonary vasculature up to 3.6 cm, potentially increased from the previous exam. No signs of acute pulmonary embolism. Limited assessment beyond the lobar level in most cases particularly in the lower lobes and mid chest, visualized segmental branches without definite signs of embolism. Mediastinum/Nodes: Esophagus grossly normal. Thoracic inlet structures are normal. No axillary adenopathy. No mediastinal adenopathy.  No hilar adenopathy.  Lungs/Pleura: Bandlike consolidative process, subpleural changes in the RIGHT lung with slight worsening since the previous exam. Bandlike subpleural changes in the LEFT peripheral chest in the upper lobe are stable. Worsening of septal thickening but now with frank consolidative process at the LEFT base in the LEFT lower lobe when compared to the prior study. Juxta diaphragmatic atelectasis with elevated RIGHT hemidiaphragm with similar appearance to prior imaging. No pneumothorax. No pleural effusion. Upper Abdomen: Hepatic steatosis. Elevated RIGHT hemidiaphragm. Liver is incompletely imaged. Imaged portions of pancreas, spleen, RIGHT and LEFT kidney without acute findings. Presumed cyst of the RIGHT kidney is unchanged. No acute gastrointestinal process on limited assessment. Musculoskeletal: Spinal degenerative changes. Signs of prior trauma to the RIGHT hemithorax. Healed rib fractures along the RIGHT chest as before. No acute musculoskeletal process. Review of the MIP images confirms the above findings. IMPRESSION: No signs of pulmonary embolism at the central or lobar level and in most visualized segmental branches but with limited assessment particularly in the mid and lower chest due to respiratory motion. 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 Given findings of post inflammatory fibrosis on the previous study would suggest follow-up to ensure there is no worsening of baseline changes. Persistent elevation of the RIGHT hemidiaphragm. Correlate with any history of diaphragmatic dysfunction Enlargement of pulmonary vasculature up to 3.6 cm, potentially increased from the previous exam. Correlate with any clinical signs of pulmonary arterial hypertension. Hepatic steatosis. Aortic Atherosclerosis (ICD10-I70.0). Electronically Signed   By: GZetta BillsM.D.   On: 06/15/2021 19:42   DG Chest Portable 1 View  Result Date:  06/15/2021  CLINICAL DATA:  Shortness of breath. EXAM: PORTABLE CHEST 1 VIEW COMPARISON:  November 02, 2020 FINDINGS: There is stable right-sided venous Port-A-Cath positioning. Moderate severity right basilar linear scarring and/or atelectasis is seen. There is no evidence of a pleural effusion or pneumothorax. Marked severity elevation of the right hemidiaphragm is seen and unchanged in severity. The heart size and mediastinal contours are within normal limits. Degenerative changes seen throughout the thoracic spine. IMPRESSION: Marked severity elevation of the right hemidiaphragm with moderate severity right basilar linear scarring and/or atelectasis. Electronically Signed   By: Virgina Norfolk M.D.   On: 06/15/2021 18:22    EKG: Independently reviewed.  EKG is reviewed and shows sinus tachycardia with a left bundle branch block.  Nonspecific ST changes in the inferior septal leads.  QTc long at 491  Assessment/Plan Principal Problem:   Multifocal pneumonia Mr. Mourning to stepdown unit.  He is placed on Rocephin and doxycycline for antibiotic coverage of pneumonia. Gentle IV fluid hydration with LR at 75 ml/hr. and bolus of IV fluids in the emergency room. Check CBC, electrolytes and renal function in am.   Active Problems:   Acute respiratory failure with hypoxia  Patient been placed on BiPAP and is tolerating it well.  We will wean BiPAP as tolerated.  Albuterol as needed. RT to follow    Sepsis  Meets sepsis criteria with pneumonia with tachycardia, tachypnea, respiratory failure with hypoxia. Lactic acid level elevated and will be monitored.    Essential hypertension Continue losartan and Inderal.  Monitor blood pressure    Acute exacerbation of chronic obstructive pulmonary disease (COPD) Albuterol as needed for shortness of breath.  Patient is currently on BiPAP and tolerating it well.  We will wean BiPAP as tolerated    History of B-cell lymphoma Followed by oncology    LBBB (left  bundle branch block) Monitor     Prolonged QT interval Avoid medications which could further prolong QT interval     DVT prophylaxis: Lovenox for DVT prophylaxis Code Status:   Full code Family Communication:  Diagnosis and plan discussed with patient.  Verbalized understanding and agrees with plan.  Further recommendations to follow as clinical indicated Disposition Plan:   Patient is from:  Home  Anticipated DC to:  Home  Anticipated DC date:  Anticipate 2 midnight or more stay in the hospital  Admission status:  Inpatient   Yevonne Aline Jennika Ringgold MD [facility]  How to contact the [facility] Attending or Consulting provider [place] or covering provider during after hours [place], for this patient?   Check the care team in [facility] and look for a) attending/consulting TRH provider listed and b) the [facility] team listed Log into [email or web address] and use [place] universal password to access. If you do not have the password, please contact the hospital operator. Locate the [facility] provider you are looking for under [facility] and page to a number that you can be directly reached. If you still have difficulty reaching the provider, please page the [facility] (Director on Call) for the Hospitalists listed on amion for assistance.  06/16/2021, 12:40 AM

## 2021-06-16 NOTE — TOC Initial Note (Signed)
Transition of Care [facility]) - Initial/Assessment Note    Patient Details  Name: Antonio [name] MRN: [id number] Date of Birth: Nov 20, 1940  Transition of Care [facility]) CM/SW Contact:    Leeroy Cha, RN Phone Number: 06/16/2021, 7:31 AM  Clinical Narrative:                 80 y.o. 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  He reports symptoms have been progressive getting worse over the last 5 days.  Last night he developed a fever to 101 degrees and took some Tylenol at home.  He had progressively worsening shortness of breath that is exacerbated by exertion.  He has a cough that is productive of a sputum but he has not looked at the color of it.  He states he just does not generally feel well.  He has had decreased appetite but no nausea vomiting or diarrhea.  He states he feels weak and has low energy level.  He has not had any chest pain, syncope, abdominal pain, urinary symptoms.  He did have COVID in January was admitted for 16 days.  He has had multiple COVID test at home and in the emergency room last night that have all been negative.   ED Course: [name] has had tachycardia and tachypnea in the emergency room but no hypotension.  He has had hypoxia down to 84% on room air.  He was placed on BiPAP.  Found to have bilateral upper lobe pneumonia on CT scan with no PE.  Lactic acid was 2.2 and increased to 4.0.  Initial troponin was 10.  BNP 66.3.  Electrolytes AST mildly elevated 47 ALT of 71.  Creatinine 0.60.  CBC was unremarkable.  Hospitalist service was asked to admit patient for further management  TOC PLAN OF CARE: will follow for hhc and dme needs-home o2?,  PROGRESSION: on bipap Expected Discharge Plan: Home/Self Care Barriers to Discharge: Continued Medical Work up   Patient Goals and CMS Choice        Expected Discharge Plan and  Services Expected Discharge Plan: Home/Self Care   Discharge Planning Services: CM Consult   Living arrangements for the past 2 months: Single Family Home                                      Prior Living Arrangements/Services Living arrangements for the past 2 months: Single Family Home Lives with:: Spouse Patient language and need for interpreter reviewed:: Yes Do you feel safe going back to the place where you live?: Yes            Criminal Activity/Legal Involvement Pertinent to Current Situation/Hospitalization: No - Comment as needed  Activities of Daily Living Home Assistive Devices/Equipment: None ADL Screening (condition at time of admission) Patient's cognitive ability adequate to safely complete daily activities?: Yes Is the patient deaf or have difficulty hearing?: No Does the patient have difficulty seeing, even when wearing glasses/contacts?: No Does the patient have difficulty concentrating, remembering, or making decisions?: No Patient able to express need for assistance with ADLs?: Yes Does the patient have difficulty dressing or bathing?: No Independently performs ADLs?: Yes (appropriate for developmental age) Does the patient have difficulty walking or climbing stairs?: No Weakness of Legs: None Weakness of Arms/Hands: None  Permission Sought/Granted  Emotional Assessment Appearance:: Appears stated age     Orientation: : Oriented to Self, Oriented to Place, Oriented to  Time, Oriented to Situation Alcohol / Substance Use: Not Applicable Psych Involvement: No (comment)  Admission diagnosis:  LBBB (left bundle branch block) [I44.7] Acute exacerbation of chronic obstructive pulmonary disease (COPD) (HCC) [J44.1] Hypoxia [R09.02] Acute respiratory failure with hypoxia (HCC) [J96.01] Acute dyspnea [R06.00] Patient Active Problem List   Diagnosis Date Noted   Multifocal pneumonia 06/16/2021   Acute exacerbation of chronic  obstructive pulmonary disease (COPD) ([place]) 06/16/2021   Sepsis ([place]) 06/16/2021   LBBB (left bundle branch block) 06/16/2021   Chronic respiratory failure with hypoxia ([place]) 12/16/2020   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 12/16/2020   Post-COVID chronic neurologic symptoms 12/16/2020   Lobar pneumonia ([place]) 10/27/2020   Acute hypoxemic respiratory failure due to COVID-19 ([place]) 10/26/2020   Mixed hyperlipidemia [id number]   Acute metabolic encephalopathy [id number]   Symptomatic anemia 10/20/2020   Acute respiratory failure with hypoxia (HCC) 10/20/2020   Pleural effusion 10/20/2020   Testosterone deficiency 09/09/2020   S/P orchiectomy 09/09/2020   Diffuse large B cell lymphoma ([place]) 03/17/2020   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   Counseling regarding advance care planning and goals of care 03/17/2020   Hyperglycemia 09/05/2018   Mass of soft tissue of left lower extremity 09/05/2018   Monoclonal gammopathy of unknown significance (MGUS) 09/05/2018   Macrocytosis without anemia 09/05/2018   Macular degeneration of both eyes 06/21/2017   Slow transit constipation 06/21/2017   Sleep-wake disorder 06/21/2017   Essential hypertension 06/21/2017   Pure hypercholesterolemia 06/21/2017   History of B-cell lymphoma 06/21/2017   Personal history of malignant melanoma 06/21/2017   Left carotid artery stenosis 06/21/2017   DDD (degenerative disc disease), cervical 06/21/2017   History of kidney stones 06/21/2017   Tubular adenoma of colon 06/21/2017   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Phone: [phone]: [phone] [address]Phone: [phone]: [phone] [facility] [address]Phone: [phone]: [phone]    Social Determinants of Health (SDOH) Interventions    Readmission Risk Interventions No flowsheet data found.

## 2021-06-17 DIAGNOSIS — J189 Pneumonia, unspecified organism: Secondary | ICD-10-CM | POA: Diagnosis not present

## 2021-06-17 LAB — CBC WITH DIFFERENTIAL/PLATELET
Abs Immature Granulocytes: 0.31 10*3/uL — ABNORMAL HIGH (ref 0.00–0.07)
Basophils Absolute: 0.1 10*3/uL (ref 0.0–0.1)
Basophils Relative: 1 %
Eosinophils Absolute: 0 10*3/uL (ref 0.0–0.5)
Eosinophils Relative: 0 %
HCT: 41.1 % (ref 39.0–52.0)
Hemoglobin: 13 g/dL (ref 13.0–17.0)
Immature Granulocytes: 4 %
Lymphocytes Relative: 5 %
Lymphs Abs: 0.4 10*3/uL — ABNORMAL LOW (ref 0.7–4.0)
MCH: 33.9 pg (ref 26.0–34.0)
MCHC: 31.6 g/dL (ref 30.0–36.0)
MCV: 107 fL — ABNORMAL HIGH (ref 80.0–100.0)
Monocytes Absolute: 1.2 10*3/uL — ABNORMAL HIGH (ref 0.1–1.0)
Monocytes Relative: 16 %
Neutro Abs: 5.4 10*3/uL (ref 1.7–7.7)
Neutrophils Relative %: 74 %
Platelets: 183 10*3/uL (ref 150–400)
RBC: 3.84 MIL/uL — ABNORMAL LOW (ref 4.22–5.81)
RDW: 14.3 % (ref 11.5–15.5)
WBC: 7.3 10*3/uL (ref 4.0–10.5)
nRBC: 0 % (ref 0.0–0.2)

## 2021-06-17 LAB — BASIC METABOLIC PANEL
Anion gap: 7 (ref 5–15)
BUN: 23 mg/dL (ref 8–23)
CO2: 31 mmol/L (ref 22–32)
Calcium: 8.8 mg/dL — ABNORMAL LOW (ref 8.9–10.3)
Chloride: 105 mmol/L (ref 98–111)
Creatinine, Ser: 0.52 mg/dL — ABNORMAL LOW (ref 0.61–1.24)
GFR, Estimated: >60 mL/min (ref >60)
Glucose, Bld: 141 mg/dL — ABNORMAL HIGH (ref 70–99)
Potassium: 4.4 mmol/L (ref 3.5–5.1)
Sodium: 143 mmol/L (ref 135–145)

## 2021-06-17 LAB — LEGIONELLA PNEUMOPHILA SEROGP 1 UR AG: L. pneumophila Serogp 1 Ur Ag: NEGATIVE

## 2021-06-17 LAB — MAGNESIUM: Magnesium: 2.3 mg/dL (ref 1.7–2.4)

## 2021-06-17 LAB — PHOSPHORUS: Phosphorus: 1.7 mg/dL — ABNORMAL LOW (ref 2.5–4.6)

## 2021-06-17 MED ORDER — SODIUM PHOSPHATES 45 MMOLE/15ML IV SOLN
20.0000 mmol | Freq: Once | INTRAVENOUS | Status: AC
  Administered 2021-06-17: 20 mmol via INTRAVENOUS
  Filled 2021-06-17: qty 6.67

## 2021-06-17 MED ORDER — DOXYCYCLINE HYCLATE 100 MG PO TABS
100.0000 mg | ORAL_TABLET | Freq: Two times a day (BID) | ORAL | Status: DC
  Administered 2021-06-17 – 2021-06-21 (×9): 100 mg via ORAL
  Filled 2021-06-17 (×9): qty 1

## 2021-06-17 NOTE — Progress Notes (Signed)
[facility] Progress Note Patient Name: MELVEN MITRANI DOB: 1941-07-05 MRN: [id number]   Date of Service  06/17/2021  HPI/Events of Note  Patient with agitated delirium secondary to ETOH, but he was extremely sensitive to Ativan earlier and required Flumazenil. Patient also positive 400 ml of urine on bladder scan.  eICU Interventions  Low dose Precedex gtt ordered, Foley catheter ordered as patient is sedated and on BIPAP, with acute urinary retention, and the concern is that in / out catheterizations will exacerbate his delirium.        Kerry Kass Orvil Faraone 06/17/2021, 12:15 AM

## 2021-06-17 NOTE — Progress Notes (Signed)
PROGRESS NOTE    Antonio [name]  [id number] DOB: 06/09/1941 DOA: 06/15/2021 PCP: [name], MD    Brief Narrative:  79 year old male with history of B-cell lymphoma, hypertension COPD and hyperlipidemia, COVID-19 infection with prolonged hospitalization in January 2022 presented with shortness of breath and cough worse for last 5 days.  Reported temperature 101 at home.  In the emergency room, tachycardic and tachypneic.  Blood pressure stable.  84% on room air.  Placed on BiPAP.  CTA showed no PE, lactic acid 4, CT scan with bilateral upper lobe fibrosis superimposed pneumonia. Patient admitted on BiPAP. 9/1, overnight with agitation.  Suspected alcohol withdrawal.  Hypersensitivity to Ativan and started on Precedex drip.   Assessment & Plan:   Principal Problem:   Multifocal pneumonia Active Problems:   Essential hypertension   History of B-cell lymphoma   Acute respiratory failure with hypoxia (HCC)   Acute exacerbation of chronic obstructive pulmonary disease (COPD) (HCC)   Sepsis (HCC)   LBBB (left bundle branch block)  Multifocal pneumonia: Acute hypoxemic respiratory failure secondary to multifocal pneumonia and chronic lung disease due to COVID-19 infection. Patient likely has gram-positive and gram-negative pneumonia.   Antibiotics to treat bacterial pneumonia with doxycycline and Rocephin.  We will continue. Chest physiotherapy, incentive spirometry, deep breathing exercises, sputum induction, mucolytic's and bronchodilators. Albuterol as needed. Levo albuterol every 6 hours to avoid tachycardia. Sputum cultures, blood cultures, Legionella and streptococcal antigen.  Cultures negative so far. Supplemental oxygen to keep saturations more than 90%.  BiPAP for respiratory distress. Added Solu-Medrol with significant bronchospasm and post COVID fibrosis.  Sepsis present on admission:Sepsis present as the patient has 2 of the following  Lactic acid 4 RR >20 And  suspect his source of infections is pneumonia Stabilizing.  On maintenance IV fluids.  Will discontinue when he is able to take by mouth.  Essential hypertension: Blood pressure stable on losartan and Inderal.  Continue propanolol to avoid tachycardia.  COPD: As above.  Chronic left bundle branch block: Stable.  Hypophosphatemia: Replace aggressively and monitor levels.  Recheck tomorrow morning.  Anxiety/agitation: Patient consumes about 14 drinks of alcohol in a week.  Also has baseline tremors and steroid-induced tremors.  He had episode of tremulousness and agitation that was thought to be alcohol withdrawal. 8/31, 2 mg Ativan resulted in obtundation needing reversal with flumazenil. Overnight he started on Precedex and remains quite comfortable. Will continue Precedex with critical care help.   DVT prophylaxis: enoxaparin (LOVENOX) injection 40 mg Start: 06/16/21 1000   Code Status: Full code Family Communication: None. Disposition Plan: Status is: Inpatient  Remains inpatient appropriate because:Inpatient level of care appropriate due to severity of illness  Dispo: The patient is from: Home              Anticipated d/c is to: Home              Patient currently is not medically stable to d/c.   Difficult to place patient No         Consultants:  PCCM  Procedures:  None  Antimicrobials:  Anti-infectives (From admission, onward)    Start     Dose/Rate Route Frequency Ordered Stop   06/17/21 1130  doxycycline (VIBRA-TABS) tablet 100 mg        100 mg Oral Every 12 hours 06/17/21 1038     06/16/21 2200  cefTRIAXone (ROCEPHIN) 1 g in sodium chloride 0.9 % 100 mL IVPB  Status:  Discontinued  1 g 200 mL/hr over 30 Minutes Intravenous Every 24 hours 06/16/21 0038 06/16/21 0851   06/16/21 1200  cefTRIAXone (ROCEPHIN) 2 g in sodium chloride 0.9 % 100 mL IVPB        2 g 200 mL/hr over 30 Minutes Intravenous Every 24 hours 06/16/21 0851     06/16/21 0000   doxycycline (VIBRAMYCIN) 100 mg in sodium chloride 0.9 % 250 mL IVPB  Status:  Discontinued        100 mg 125 mL/hr over 120 Minutes Intravenous 2 times daily 06/15/21 2348 06/17/21 1038   06/15/21 2145  cefTRIAXone (ROCEPHIN) 1 g in sodium chloride 0.9 % 100 mL IVPB        1 g 200 mL/hr over 30 Minutes Intravenous  Once 06/15/21 2144 06/16/21 0031   06/15/21 2145  azithromycin (ZITHROMAX) 500 mg in sodium chloride 0.9 % 250 mL IVPB  Status:  Discontinued        500 mg 250 mL/hr over 60 Minutes Intravenous  Once 06/15/21 2144 06/15/21 2330          Subjective: Patient seen and examined.  Overnight events noted.  Currently on Precedex.  Remains on BiPAP on 35% FiO2.  Early morning he looks comfortable on BiPAP, was not able to understand much through the mask.  Objective: Vitals:   06/17/21 0900 06/17/21 0934 06/17/21 1000 06/17/21 1016  BP: (!) 158/85  135/75 135/75  Pulse: (!) 103 (!) 104 96 99  Resp: (!) 25 20 (!) 21   Temp: 97.9 F (36.6 C) 98.4 F (36.9 C) 98.4 F (36.9 C)   TempSrc:      SpO2: 96% 100% 99%   Weight:      Height:        Intake/Output Summary (Last 24 hours) at 06/17/2021 1041 Last data filed at 06/17/2021 1000 Gross per 24 hour  Intake 3691.38 ml  Output 750 ml  Net 2941.38 ml   Filed Weights   06/15/21 1616  Weight: 86.2 kg    Examination: General: Sick looking but not in much distress.  Was on BiPAP on my exam.  Currently on 6 L nasal cannula. Cardiovascular: S1-S2 normal.  Tachycardic. Respiratory: Bilateral poor air entry.  No added sounds. SpO2: 99 % O2 Flow Rate (L/min): 6 L/min (plus NRBM) FiO2 (%): 35 %  Gastrointestinal: Soft and nontender.  Bowel sounds present. Ext: No cyanosis or edema. Neuro: Alert to stimulation.  Mostly sleepy.  Moves all extremities. Musculoskeletal: No joint deformities.     Data Reviewed: I have personally reviewed following labs and imaging studies  CBC: Recent Labs  Lab 06/15/21 1639 06/15/21 1650  06/16/21 0623 06/17/21 0430  WBC 6.7  --  9.3 7.3  NEUTROABS 4.1  --   --  5.4  HGB 14.2 15.0 14.1 13.0  HCT 42.9 44.0 44.1 41.1  MCV 100.9*  --  104.3* 107.0*  PLT 183  --  208 [id number]   Basic Metabolic Panel: Recent Labs  Lab 06/15/21 1639 06/15/21 1650 06/16/21 0623 06/17/21 0430  NA 135 133* 139 143  K 3.9 3.8 4.0 4.4  CL 96*  --  101 105  CO2 28  --  28 31  GLUCOSE 128*  --  148* 141*  BUN 13  --  11 23  CREATININE 0.60*  --  0.68 0.52*  CALCIUM 9.1  --  8.9 8.8*  MG  --   --   --  2.3  PHOS  --   --   --  1.7*   GFR: Estimated Creatinine Clearance: 78.1 mL/min (A) (by C-G formula based on SCr of 0.52 mg/dL (L)). Liver Function Tests: Recent Labs  Lab 06/15/21 1639  AST 47*  ALT 71*  ALKPHOS 60  BILITOT 0.9  PROT 7.0  ALBUMIN 3.8   No results for input(s): LIPASE, AMYLASE in the last 168 hours. No results for input(s): AMMONIA in the last 168 hours. Coagulation Profile: No results for input(s): INR, PROTIME in the last 168 hours. Cardiac Enzymes: No results for input(s): CKTOTAL, CKMB, CKMBINDEX, TROPONINI in the last 168 hours. BNP (last 3 results) No results for input(s): PROBNP in the last 8760 hours. HbA1C: No results for input(s): HGBA1C in the last 72 hours. CBG: Recent Labs  Lab 06/16/21 1749  GLUCAP 172*   Lipid Profile: No results for input(s): CHOL, HDL, LDLCALC, TRIG, CHOLHDL, LDLDIRECT in the last 72 hours. Thyroid Function Tests: No results for input(s): TSH, T4TOTAL, FREET4, T3FREE, THYROIDAB in the last 72 hours. Anemia Panel: No results for input(s): VITAMINB12, FOLATE, FERRITIN, TIBC, IRON, RETICCTPCT in the last 72 hours. Sepsis Labs: Recent Labs  Lab 06/15/21 2309 06/16/21 0630 06/16/21 0914 06/16/21 1318  LATICACIDVEN 4.0* 2.4* 5.5* 3.0*    Recent Results (from the past 240 hour(s))  Resp Panel by RT-PCR (Flu A&B, Covid) Nasopharyngeal Swab     Status: None   Collection Time: 06/15/21  4:39 PM   Specimen: Nasopharyngeal  Swab; Nasopharyngeal(NP) swabs in vial transport medium  Result Value Ref Range Status   SARS Coronavirus 2 by RT PCR NEGATIVE NEGATIVE Final    Comment: (NOTE) SARS-CoV-2 target nucleic acids are NOT DETECTED.  The SARS-CoV-2 RNA is generally detectable in upper respiratory specimens during the acute phase of infection. The lowest concentration of SARS-CoV-2 viral copies this assay can detect is 138 copies/mL. A negative result does not preclude SARS-Cov-2 infection and should not be used as the sole basis for treatment or other patient management decisions. A negative result may occur with  improper specimen collection/handling, submission of specimen other than nasopharyngeal swab, presence of viral mutation(s) within the areas targeted by this assay, and inadequate number of viral copies(<138 copies/mL). A negative result must be combined with clinical observations, patient history, and epidemiological information. The expected result is Negative.  Fact Sheet for Patients:  EntrepreneurPulse.com.au  Fact Sheet for Healthcare Providers:  IncredibleEmployment.be  This test is no t yet approved or cleared by the [place] FDA and  has been authorized for detection and/or diagnosis of SARS-CoV-2 by FDA under an Emergency Use Authorization (EUA). This EUA will remain  in effect (meaning this test can be used) for the duration of the COVID-19 declaration under Section 564(b)(1) of the Act, 21 U.S.C.section 360bbb-3(b)(1), unless the authorization is terminated  or revoked sooner.       Influenza A by PCR NEGATIVE NEGATIVE Final   Influenza B by PCR NEGATIVE NEGATIVE Final    Comment: (NOTE) The Xpert Xpress SARS-CoV-2/FLU/RSV plus assay is intended as an aid in the diagnosis of influenza from Nasopharyngeal swab specimens and should not be used as a sole basis for treatment. Nasal washings and aspirates are unacceptable for Xpert Xpress  SARS-CoV-2/FLU/RSV testing.  Fact Sheet for Patients: EntrepreneurPulse.com.au  Fact Sheet for Healthcare Providers: IncredibleEmployment.be  This test is not yet approved or cleared by the [place] FDA and has been authorized for detection and/or diagnosis of SARS-CoV-2 by FDA under an Emergency Use Authorization (EUA). This EUA will remain in effect (meaning this  test can be used) for the duration of the COVID-19 declaration under Section 564(b)(1) of the Act, 21 U.S.C. section 360bbb-3(b)(1), unless the authorization is terminated or revoked.  Performed at [facility], [address] [place], [place] [address]   MRSA Next Gen by PCR, Nasal     Status: None   Collection Time: 06/15/21 11:43 PM   Specimen: Nasal Mucosa; Nasal Swab  Result Value Ref Range Status   MRSA by PCR Next Gen NOT DETECTED NOT DETECTED Final    Comment: (NOTE) The GeneXpert MRSA Assay (FDA approved for NASAL specimens only), is one component of a comprehensive MRSA colonization surveillance program. It is not intended to diagnose MRSA infection nor to guide or monitor treatment for MRSA infections. Test performance is not FDA approved in patients less than 20 years old. Performed at [facility], [place] [address], [place], [place] [address]          Radiology Studies: CT Angio Chest PE W and/or Wo Contrast  Result Date: 06/15/2021 CLINICAL DATA:  An 80 year old male with hypoxia and shortness of breath. Cough since Friday. EXAM: CT ANGIOGRAPHY CHEST WITH CONTRAST TECHNIQUE: Multidetector CT imaging of the chest was performed using the standard protocol during bolus administration of intravenous contrast. Multiplanar CT image reconstructions and MIPs were obtained to evaluate the vascular anatomy. CONTRAST:  44m OMNIPAQUE IOHEXOL 350 MG/ML SOLN COMPARISON:  January 13, 2021. FINDINGS: Cardiovascular: RIGHT-sided Port-A-Cath  terminating at the caval to atrial junction. Aorta of normal caliber with scattered atheromatous plaque. Mild cardiac enlargement without pericardial effusion of substantial volume. Enlargement of pulmonary vasculature up to 3.6 cm, potentially increased from the previous exam. No signs of acute pulmonary embolism. Limited assessment beyond the lobar level in most cases particularly in the lower lobes and mid chest, visualized segmental branches without definite signs of embolism. Mediastinum/Nodes: Esophagus grossly normal. Thoracic inlet structures are normal. No axillary adenopathy. No mediastinal adenopathy.  No hilar adenopathy. Lungs/Pleura: Bandlike consolidative process, subpleural changes in the RIGHT lung with slight worsening since the previous exam. Bandlike subpleural changes in the LEFT peripheral chest in the upper lobe are stable. Worsening of septal thickening but now with frank consolidative process at the LEFT base in the LEFT lower lobe when compared to the prior study. Juxta diaphragmatic atelectasis with elevated RIGHT hemidiaphragm with similar appearance to prior imaging. No pneumothorax. No pleural effusion. Upper Abdomen: Hepatic steatosis. Elevated RIGHT hemidiaphragm. Liver is incompletely imaged. Imaged portions of pancreas, spleen, RIGHT and LEFT kidney without acute findings. Presumed cyst of the RIGHT kidney is unchanged. No acute gastrointestinal process on limited assessment. Musculoskeletal: Spinal degenerative changes. Signs of prior trauma to the RIGHT hemithorax. Healed rib fractures along the RIGHT chest as before. No acute musculoskeletal process. Review of the MIP images confirms the above findings. IMPRESSION: No signs of pulmonary embolism at the central or lobar level and in most visualized segmental branches but with limited assessment particularly in the mid and lower chest due to respiratory motion. Findings that favor pneumonia superimposed on post inflammatory  fibrosis in the LEFT upper lobe and RIGHT upper lobe. Given findings of post inflammatory fibrosis on the previous study would suggest follow-up to ensure there is no worsening of baseline changes. Persistent elevation of the RIGHT hemidiaphragm. Correlate with any history of diaphragmatic dysfunction Enlargement of pulmonary vasculature up to 3.6 cm, potentially increased from the previous exam. Correlate with any clinical signs of pulmonary arterial hypertension. Hepatic steatosis. Aortic Atherosclerosis (ICD10-I70.0). Electronically Signed  By: Zetta Bills M.D.   On: 06/15/2021 19:42   DG Chest Portable 1 View  Result Date: 06/15/2021 CLINICAL DATA:  Shortness of breath. EXAM: PORTABLE CHEST 1 VIEW COMPARISON:  November 02, 2020 FINDINGS: There is stable right-sided venous Port-A-Cath positioning. Moderate severity right basilar linear scarring and/or atelectasis is seen. There is no evidence of a pleural effusion or pneumothorax. Marked severity elevation of the right hemidiaphragm is seen and unchanged in severity. The heart size and mediastinal contours are within normal limits. Degenerative changes seen throughout the thoracic spine. IMPRESSION: Marked severity elevation of the right hemidiaphragm with moderate severity right basilar linear scarring and/or atelectasis. Electronically Signed   By: Virgina Norfolk M.D.   On: 06/15/2021 18:22        Scheduled Meds:  budesonide (PULMICORT) nebulizer solution  0.25 mg Nebulization BID   chlorhexidine  15 mL Mouth Rinse BID   Chlorhexidine Gluconate Cloth  6 each Topical Daily   doxycycline  100 mg Oral Q12H   enoxaparin (LOVENOX) injection  40 mg Subcutaneous [id number]   folic acid  1 mg Oral Daily   levalbuterol  1.25 mg Nebulization TID   losartan  100 mg Oral Daily   mouth rinse  15 mL Mouth Rinse q12n4p   methylPREDNISolone (SOLU-MEDROL) injection  80 mg Intravenous Q24H   multivitamin with minerals  1 tablet Oral Daily   propranolol  10  mg Oral TID   sodium chloride flush  10-40 mL Intracatheter Q12H   thiamine  100 mg Oral Daily   Or   thiamine  100 mg Intravenous Daily   Continuous Infusions:  sodium chloride Stopped (06/17/21 0817)   cefTRIAXone (ROCEPHIN)  IV Stopped (06/16/21 1349)   dexmedetomidine (PRECEDEX) IV infusion 0.1 mcg/kg/hr (06/17/21 1015)   lactated ringers 75 mL/hr at 06/17/21 0844   sodium phosphate  Dextrose 5% IVPB 43 mL/hr at 06/17/21 0844     LOS: 2 days    Time spent: 35 minutes   Barb Merino, MD [facility] Pager [phone]

## 2021-06-17 NOTE — Consult Note (Signed)
NAME:  Antonio [name], MRN:  [id number], DOB:  July 26, 1941, LOS: 2 ADMISSION DATE:  06/15/2021, CONSULTATION DATE:  06/17/21 REFERRING MD:  Sloan Leiter, CHIEF COMPLAINT:  shortness of breath   History of Present Illness:   Mr. Pytel is a 80 y.o. 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  Pt reports that after covid, he was eventually able to return to ADL's without difficulty and was mostly off oxygen, was lifting weights several times per week.   History lists COPD, however pt denies history of this, was not a smoker and does not use inhalers at home.   In the ED, pt required nasal cannula O2, CT chest without PE but notable for post-inflammatory fibrosis and consolidative process in the LLL.   He was admitted and started on ceftriaxone and doxycycline and PCCM consulted.  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   Significant Hospital Events: Including procedures, antibiotic start and stop dates in addition to other pertinent events   8/31 Admit to hospitalists,  on [place], PCCM consult  Interim History / Subjective:  Became lethargic after ativan. Was reversed with Flumazenil. Precedex started overnight. Tolerated BiPAP  Objective   Blood pressure 134/71, pulse (!) 106, temperature 97.9 F (36.6 C), temperature source Bladder, resp. rate (!) 22, height 6' (1.829 m), weight 86.2 kg, SpO2 92 %.    FiO2 (%):  [35 %] 35 %   Intake/Output Summary (Last 24 hours) at 06/17/2021 1218 Last data filed at 06/17/2021 1214 Gross per 24 hour  Intake 4109.93 ml  Output 750 ml  Net 3359.93 ml   Filed Weights   06/15/21 1616  Weight: 86.2 kg   Physical Exam: General: Chronically ill-appearing, no acute distress HENT: , AT, BiPAP in place Eyes: EOMI, no scleral icterus Respiratory: Diminished LLL breath sounds. Vented breath sounds from BiPAP Cardiovascular: RRR, -M/R/G, no JVD Extremities:-Edema,-tenderness Neuro: AAO x4, CNII-XII grossly intact  Resolved Hospital Problem list     Assessment & Plan:   Acute hypoxemic respiratory failure Severe sepsis Secondary to suspected bacterial pneumonia pneumonia superimposed on post-inflammatory fibrosis Baseline had intermittent hypoxemia with heavy exertion likely representing residual fibrosis from his COVID-19 infection >6 months ago. Pneumonia likely exacerbating his hypoxemia and may be a prolonged course for recovery. Once improved, he will need repeat ambulatory O2, chest imaging and/or PFTs as an outpatient. P:  -Agree with antibiotics -Agree with steroids -Pulmicort BID -Wean oxygen for goal SpO2 >88% -BiPAP nightly  -Wean precedex. Consider PO ativan PRN in the future for anxiety -IS and Flutter valve -High risk for deterioration  Best Practice (right click and "Reselect all SmartList Selections" daily)   Per primary  Labs   CBC: Recent Labs  Lab  06/15/21 1639 06/15/21 1650 06/16/21 0623 06/17/21 0430  WBC 6.7  --  9.3 7.3  NEUTROABS 4.1  --   --  5.4  HGB 14.2 15.0 14.1 13.0  HCT  42.9 44.0 44.1 41.1  MCV 100.9*  --  104.3* 107.0*  PLT 183  --  208 [id number]    Basic Metabolic Panel: Recent Labs  Lab 06/15/21 1639 06/15/21 1650 06/16/21 0623 06/17/21 0430  NA 135 133* 139 143  K 3.9 3.8 4.0 4.4  CL 96*  --  101 105  CO2 28  --  28 31  GLUCOSE 128*  --  148* 141*  BUN 13  --  11 23  CREATININE 0.60*  --  0.68 0.52*  CALCIUM 9.1  --  8.9 8.8*  MG  --   --   --  2.3  PHOS  --   --   --  1.7*   GFR: Estimated Creatinine Clearance: 78.1 mL/min (A) (by C-G formula based on SCr of 0.52 mg/dL (L)). Recent Labs  Lab 06/15/21 1639 06/15/21 2008 06/15/21 2309 06/16/21 0623 06/16/21 0630 06/16/21 0914 06/16/21 1318 06/17/21 0430  WBC 6.7  --   --  9.3  --   --   --  7.3  LATICACIDVEN  --    < > 4.0*  --  2.4* 5.5* 3.0*  --    < > = values in this interval not displayed.    Liver Function Tests: Recent Labs  Lab 06/15/21 1639  AST 47*  ALT 71*  ALKPHOS 60  BILITOT 0.9  PROT 7.0  ALBUMIN 3.8   No results for input(s): LIPASE, AMYLASE in the last 168 hours. No results for input(s): AMMONIA in the last 168 hours.  ABG    Component Value Date/Time   PHART 7.474 (H) 10/26/2020 1540   PCO2ART 40.2 10/26/2020 1540   PO2ART 58 (L) 10/26/2020 1540   HCO3 31.0 (H) 06/15/2021 1650   TCO2 33 (H) 06/15/2021 1650   O2SAT 72.0 06/15/2021 1650     Coagulation Profile: No results for input(s): INR, PROTIME in the last 168 hours.  Cardiac Enzymes: No results for input(s): CKTOTAL, CKMB, CKMBINDEX, TROPONINI in the last 168 hours.  HbA1C: Hemoglobin A1C  Date/Time Value Ref Range Status  02/27/2020 08:00 AM 5.7  Final  02/26/2019 12:00 AM 5.6  Final    CBG: Recent Labs  Lab 06/16/21 1749  GLUCAP 172*    Critical care time: n/a    Rodman Pickle, M.D. [facility] Pulmonary/Critical Care Medicine 06/17/2021 12:18 PM   Please see Amion for pager number to reach on-call Pulmonary and Critical Care Team.

## 2021-06-18 DIAGNOSIS — J189 Pneumonia, unspecified organism: Secondary | ICD-10-CM | POA: Diagnosis not present

## 2021-06-18 LAB — CBC WITH DIFFERENTIAL/PLATELET
Abs Immature Granulocytes: 0.57 10*3/uL — ABNORMAL HIGH (ref 0.00–0.07)
Basophils Absolute: 0.1 10*3/uL (ref 0.0–0.1)
Basophils Relative: 1 %
Eosinophils Absolute: 0 10*3/uL (ref 0.0–0.5)
Eosinophils Relative: 0 %
HCT: 39.9 % (ref 39.0–52.0)
Hemoglobin: 12.5 g/dL — ABNORMAL LOW (ref 13.0–17.0)
Immature Granulocytes: 5 %
Lymphocytes Relative: 5 %
Lymphs Abs: 0.6 10*3/uL — ABNORMAL LOW (ref 0.7–4.0)
MCH: 33.6 pg (ref 26.0–34.0)
MCHC: 31.3 g/dL (ref 30.0–36.0)
MCV: 107.3 fL — ABNORMAL HIGH (ref 80.0–100.0)
Monocytes Absolute: 1.5 10*3/uL — ABNORMAL HIGH (ref 0.1–1.0)
Monocytes Relative: 12 %
Neutro Abs: 9.7 10*3/uL — ABNORMAL HIGH (ref 1.7–7.7)
Neutrophils Relative %: 77 %
Platelets: 227 10*3/uL (ref 150–400)
RBC: 3.72 MIL/uL — ABNORMAL LOW (ref 4.22–5.81)
RDW: 14.2 % (ref 11.5–15.5)
WBC: 12.5 10*3/uL — ABNORMAL HIGH (ref 4.0–10.5)
nRBC: 0 % (ref 0.0–0.2)

## 2021-06-18 LAB — COMPREHENSIVE METABOLIC PANEL
ALT: 148 U/L — ABNORMAL HIGH (ref 0–44)
AST: 36 U/L (ref 15–41)
Albumin: 2.5 g/dL — ABNORMAL LOW (ref 3.5–5.0)
Alkaline Phosphatase: 86 U/L (ref 38–126)
Anion gap: 5 (ref 5–15)
BUN: 28 mg/dL — ABNORMAL HIGH (ref 8–23)
CO2: 34 mmol/L — ABNORMAL HIGH (ref 22–32)
Calcium: 8.2 mg/dL — ABNORMAL LOW (ref 8.9–10.3)
Chloride: 99 mmol/L (ref 98–111)
Creatinine, Ser: 0.73 mg/dL (ref 0.61–1.24)
GFR, Estimated: >60 mL/min (ref >60)
Glucose, Bld: 133 mg/dL — ABNORMAL HIGH (ref 70–99)
Potassium: 4.3 mmol/L (ref 3.5–5.1)
Sodium: 138 mmol/L (ref 135–145)
Total Bilirubin: 0.5 mg/dL (ref 0.3–1.2)
Total Protein: 5.2 g/dL — ABNORMAL LOW (ref 6.5–8.1)

## 2021-06-18 LAB — MAGNESIUM: Magnesium: 2.2 mg/dL (ref 1.7–2.4)

## 2021-06-18 LAB — PHOSPHORUS: Phosphorus: 1.9 mg/dL — ABNORMAL LOW (ref 2.5–4.6)

## 2021-06-18 MED ORDER — HYDRALAZINE HCL 20 MG/ML IJ SOLN
10.0000 mg | Freq: Three times a day (TID) | INTRAMUSCULAR | Status: DC | PRN
  Administered 2021-06-18 – 2021-06-19 (×3): 10 mg via INTRAVENOUS
  Filled 2021-06-18 (×3): qty 1

## 2021-06-18 MED ORDER — HYDROCOD POLST-CPM POLST ER 10-8 MG/5ML PO SUER
5.0000 mL | Freq: Two times a day (BID) | ORAL | Status: DC | PRN
  Administered 2021-06-18: 5 mL via ORAL
  Filled 2021-06-18: qty 5

## 2021-06-18 MED ORDER — HALOPERIDOL LACTATE 5 MG/ML IJ SOLN: 2.0000 mg | Freq: Four times a day (QID) | INTRAMUSCULAR | Status: DC | PRN

## 2021-06-18 MED ORDER — OLANZAPINE 5 MG PO TBDP
10.0000 mg | ORAL_TABLET | Freq: Every day | ORAL | Status: DC
  Administered 2021-06-18 – 2021-06-19 (×2): 10 mg via ORAL
  Filled 2021-06-18 (×2): qty 2

## 2021-06-18 MED ORDER — GUAIFENESIN-DM 100-10 MG/5ML PO SYRP
5.0000 mL | ORAL_SOLUTION | ORAL | Status: DC | PRN
  Administered 2021-06-18 – 2021-06-21 (×9): 5 mL via ORAL
  Filled 2021-06-18: qty 5
  Filled 2021-06-18 (×7): qty 10
  Filled 2021-06-18: qty 5

## 2021-06-18 MED ORDER — LEVALBUTEROL HCL 1.25 MG/0.5ML IN NEBU
1.2500 mg | INHALATION_SOLUTION | Freq: Two times a day (BID) | RESPIRATORY_TRACT | Status: DC
  Administered 2021-06-18 – 2021-06-21 (×6): 1.25 mg via RESPIRATORY_TRACT
  Filled 2021-06-18 (×6): qty 0.5

## 2021-06-18 MED ORDER — MENTHOL 3 MG MT LOZG
1.0000 | LOZENGE | OROMUCOSAL | Status: DC | PRN
  Administered 2021-06-18 (×3): 3 mg via ORAL
  Filled 2021-06-18: qty 9

## 2021-06-18 MED ORDER — METHYLPREDNISOLONE SODIUM SUCC 40 MG IJ SOLR
40.0000 mg | INTRAMUSCULAR | Status: DC
  Administered 2021-06-19: 40 mg via INTRAVENOUS
  Filled 2021-06-18: qty 1

## 2021-06-18 MED ORDER — TESTOSTERONE 20.25 MG/ACT (1.62%) TD GEL
40.5000 mg | Freq: Every day | TRANSDERMAL | Status: DC
  Administered 2021-06-19 – 2021-06-21 (×3): 40.5 mg via TOPICAL

## 2021-06-18 MED ORDER — K PHOS MONO-SOD PHOS DI & MONO 155-852-130 MG PO TABS
500.0000 mg | ORAL_TABLET | Freq: Three times a day (TID) | ORAL | Status: DC
  Administered 2021-06-18 – 2021-06-21 (×8): 500 mg via ORAL
  Filled 2021-06-18 (×10): qty 2

## 2021-06-18 NOTE — Plan of Care (Signed)

## 2021-06-18 NOTE — Progress Notes (Signed)
Pt became agitated/restless on several occasions throughout the night. Pt found pulling off gown, telemetry leads, and pulled out IV access to implanted port. IV team notified and implanted port was accessed. Pt restarted on Precedex, however, pt remained restless and anxious several hours afterward. Elink/MD notified in reference to pt's increased agitation.

## 2021-06-18 NOTE — Progress Notes (Signed)
NAME:  Antonio [name], MRN:  [id number], DOB:  1941/08/04, LOS: 3 ADMISSION DATE:  06/15/2021, CONSULTATION DATE: 06/16/2021 REFERRING MD:  Sloan Leiter - TR[facility] CHIEF COMPLAINT:  Shortness of breath  History of Present Illness:   80 year old man who presented to [facility] ED 8/30 with cough, dyspnea and fever x 5 days.  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 History lists COPD, however patient denies history of this; he was not a smoker and does not use inhalers at home.  In the ED, patient required O2 via [place]; CT Chest was negative for PE but demonstrated post-inflammatory fibrosis and consolidative process in the LLL. He was admitted and started on ceftriaxone and doxycycline.  PCCM consulted for management.  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  Significant Hospital Events: Including procedures, antibiotic start and stop dates in addition to other pertinent events    8/31 - Admit to hospitalists, on [place], PCCM consult 9/02 - Agitated overnight requiring increased Precedex, ongoing O2 requirement (5LNC at rest during the day, BiPAP at night). Ongoing productive cough despite multiple neb treatments/steroids.  Interim History / Subjective:  Agitated overnight per RN Pulling at lines/devices, Port-A-Cath accidentally deaccessed Required increased Precedex overnight to tolerate BiPAP Continues on 5LNC at rest Sitting up in bed, eating breakfast Ongoing productive cough with episodes leading to temporary desat  Objective:  Blood pressure 109/71, pulse (!) 50, temperature 97.9 F (36.6 C), temperature source Oral, resp. rate 18, height 6' (1.829 m), weight 86.2 kg, SpO2 96 %.    FiO2 (%):  [35 %] 35 %   Intake/Output Summary (Last 24 hours) at 06/18/2021 0921 Last data filed at 06/18/2021 0801 Gross per 24 hour  Intake 2071.15 ml  Output 975 ml  Net 1096.15 ml    Filed Weights   06/15/21 1616  Weight: 86.2 kg   Physical Examination: General: Acutely ill-appearing elderly man in NAD. Sitting up in bed, eating. HEENT: [place]/AT, anicteric sclera, PERRL, moist mucous membranes. [place] in place. Neuro: Awake, oriented x 4. Responds to verbal stimuli. Following commands consistently. Moves all 4 extremities spontaneously. Strength 4-5/5 in all 4 extremities. CV: RRR, no m/g/r. PULM: Breathing even and unlabored on 5LNC, coughing intermittently. Lung fields diminished at bilateral bases, L > R. Expiratory wheeze noted. GI: Soft, nontender, nondistended. Normoactive bowel sounds. Extremities: Trace BLE edema noted. Skin: Warm/dry, no rashes.  Resolved Hospital Problem List:     Assessment & Plan:   Acute hypoxemic respiratory failure Severe sepsis Secondary to suspected bacterial pneumonia pneumonia superimposed on post-inflammatory fibrosis Baseline intermittent hypoxemia with heavy exertion, likely representing residual fibrosis from COVID-19 infection >6  months ago. Pneumonia likely exacerbating his hypoxemia and may contribute to a prolonged course for recovery. Once improved, he will need repeat ambulatory O2 testing, chest imaging and/or PFTs as an outpatient.  - Agree with ceftriaxone/doxycycline for CAP coverage - Continue steroids - Continue Albuterol, Pulmicort, Xopenex nebulizations - Wean supplemental O2 for goal sat > 88% - BiPAP QHS + PRN - Pulmonary hygiene, IS/flutter valve - Tussionex added Q12H PRN - Wean Precedex as able to support respiratory status - Consider morphine in place of Ativan for anxiety associated with air hunger, given concerns with intolerance/lethargy and need for benzo reversal  Best Practice (right click and "Reselect all SmartList Selections" daily)   Per Primary Team  Critical care time: N/A   Rhae Lerner [facility] 06/18/21 9:27 AM  Please see [email or web address] for pager details.  From 7A-7P if no response, please call [phone] After hours, please call ELink [phone]

## 2021-06-18 NOTE — Progress Notes (Signed)
BP reassessed post Hydralazine. Significant improvement noted. Patient denies any pain.   Comfort afforded. Will continue to monitor.

## 2021-06-18 NOTE — Progress Notes (Signed)
PROGRESS NOTE    Antonio [name]  [id number] DOB: [birth year] DOA: 06/15/2021 PCP: [name], MD    Brief Narrative:  80 year old male with history of B-cell lymphoma, hypertension COPD and hyperlipidemia, COVID-19 infection with prolonged hospitalization in January 2022 presented with shortness of breath and cough worse for last 5 days.  Reported temperature 101 at home.  In the emergency room, tachycardic and tachypneic.  Blood pressure stable.  84% on room air.  Placed on BiPAP.  CTA showed no PE, lactic acid 4, CT scan with bilateral upper lobe fibrosis superimposed pneumonia. Patient admitted on BiPAP. 9/1, overnight with agitation.  Suspected alcohol withdrawal.  Hypersensitivity to Ativan and started on Precedex drip.   Assessment & Plan:   Principal Problem:   Multifocal pneumonia Active Problems:   Essential hypertension   History of B-cell lymphoma   Acute respiratory failure with hypoxia (HCC)   Acute exacerbation of chronic obstructive pulmonary disease (COPD) (HCC)   Sepsis (HCC)   LBBB (left bundle branch block)  Multifocal pneumonia: Acute hypoxemic respiratory failure secondary to multifocal pneumonia and chronic lung disease due to COVID-19 infection. Some clinical improvement today.  Currently on 4 to 6 L of oxygen. Blood cultures negative. Remains on doxycycline and Rocephin that we will continue for 7 days. 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 Start mobilizing today. Incentive spirometry and flutter valve.  Bronchodilator therapy with albuterol. Keep on oxygen to keep saturation more than 90%.  Sepsis present on admission:Sepsis present as the patient has 2 of the following  Lactic acid 4 RR >20 And suspect his source of infections is pneumonia Stabilizing.  On maintenance IV fluids.  Discontinue today.    Essential hypertension: Blood pressure stable on losartan and Inderal.  Continue  propanolol to avoid tachycardia.  COPD: As above.  Chronic left bundle branch block: Stable.  Electrolytes: Replaced and adequate.  Anxiety/agitation: Patient consumes about 14 drinks of alcohol in a week.  Also has baseline tremors and steroid-induced tremors.  He had episode of tremulousness and agitation that was thought to be alcohol withdrawal. 8/31, 2 mg Ativan resulted in obtundation needing reversal with flumazenil. Overnight he started on Precedex and remains quite comfortable. Precedex to be tapered off today. Started on Haldol and nightly Zyprexa.  Start mobilizing.  We will consult PT OT today.   DVT prophylaxis: enoxaparin (LOVENOX) injection 40 mg Start: 06/16/21 1000   Code Status: Full code Family Communication: wife on the phone  Disposition Plan: Status is: Inpatient  Remains inpatient appropriate because:Inpatient level of care appropriate due to severity of illness  Dispo: The patient is from: Home              Anticipated d/c is to: Home              Patient currently is not medically stable to d/c.   Difficult to place patient No   Remains in ICU , needing precedex for agitation       Consultants:  PCCM  Procedures:  None  Antimicrobials:  Anti-infectives (From admission, onward)    Start     Dose/Rate Route Frequency Ordered Stop   06/17/21 1130  doxycycline (VIBRA-TABS) tablet 100 mg        100 mg Oral Every 12 hours 06/17/21 1038     06/16/21 2200  cefTRIAXone (ROCEPHIN) 1 g in sodium chloride 0.9 % 100 mL IVPB  Status:  Discontinued  1 g 200 mL/hr over 30 Minutes Intravenous Every 24 hours 06/16/21 0038 06/16/21 0851   06/16/21 1200  cefTRIAXone (ROCEPHIN) 2 g in sodium chloride 0.9 % 100 mL IVPB        2 g 200 mL/hr over 30 Minutes Intravenous Every 24 hours 06/16/21 0851     06/16/21 0000  doxycycline (VIBRAMYCIN) 100 mg in sodium chloride 0.9 % 250 mL IVPB  Status:  Discontinued        100 mg 125 mL/hr over 120 Minutes  Intravenous 2 times daily 06/15/21 2348 06/17/21 1038   06/15/21 2145  cefTRIAXone (ROCEPHIN) 1 g in sodium chloride 0.9 % 100 mL IVPB        1 g 200 mL/hr over 30 Minutes Intravenous  Once 06/15/21 2144 06/16/21 0031   06/15/21 2145  azithromycin (ZITHROMAX) 500 mg in sodium chloride 0.9 % 250 mL IVPB  Status:  Discontinued        500 mg 250 mL/hr over 60 Minutes Intravenous  Once 06/15/21 2144 06/15/21 2330          Subjective: Patient seen and examined in the morning rounds.  More alert and communicating today.  Overnight remained on Precedex.  Remains on 4 to 6 L of oxygen.  We discussed about respiratory therapy and he is aware about incentive spirometry and motivated to start using it.  No family at bedside.   Objective: Vitals:   06/18/21 0722 06/18/21 0724 06/18/21 0730 06/18/21 1130  BP:      Pulse:      Resp:      Temp:   97.9 F (36.6 C) 97.9 F (36.6 C)  TempSrc:   Oral Oral  SpO2: 96% 96%    Weight:      Height:        Intake/Output Summary (Last 24 hours) at 06/18/2021 1303 Last data filed at 06/18/2021 1251 Gross per 24 hour  Intake 2513.46 ml  Output 975 ml  Net 1538.46 ml   Filed Weights   06/15/21 1616  Weight: 86.2 kg    Examination: General: Looks fairly comfortable.  Slightly anxious and tremulous.  On 4 L oxygen. Cardiovascular: S1-S2 normal.  Regular rate rhythm. Respiratory: Bilateral upper airway conducted sounds.  Scattered expiratory wheezes. Gastrointestinal: Soft and nontender.  Bowel sounds present. Ext: No cyanosis or edema. Neuro: Patient is alert and awake.  Mostly oriented but slightly impulsive. Musculoskeletal: No deformities.      Data Reviewed: I have personally reviewed following labs and imaging studies  CBC: Recent Labs  Lab 06/15/21 1639 06/15/21 1650 06/16/21 0623 06/17/21 0430 06/18/21 0430  WBC 6.7  --  9.3 7.3 12.5*  NEUTROABS 4.1  --   --  5.4 9.7*  HGB 14.2 15.0 14.1 13.0 12.5*  HCT 42.9 44.0 44.1 41.1  39.9  MCV 100.9*  --  104.3* 107.0* 107.3*  PLT 183  --  208 183 [id number]   Basic Metabolic Panel: Recent Labs  Lab 06/15/21 1639 06/15/21 1650 06/16/21 0623 06/17/21 0430 06/18/21 0430  NA 135 133* 139 143 138  K 3.9 3.8 4.0 4.4 4.3  CL 96*  --  101 105 99  CO2 28  --  28 31 34*  GLUCOSE 128*  --  148* 141* 133*  BUN 13  --  11 23 28*  CREATININE 0.60*  --  0.68 0.52* 0.73  CALCIUM 9.1  --  8.9 8.8* 8.2*  MG  --   --   --  2.3  2.2  PHOS  --   --   --  1.7* 1.9*   GFR: Estimated Creatinine Clearance: 78.1 mL/min (by C-G formula based on SCr of 0.73 mg/dL). Liver Function Tests: Recent Labs  Lab 06/15/21 1639 06/18/21 0430  AST 47* 36  ALT 71* 148*  ALKPHOS 60 86  BILITOT 0.9 0.5  PROT 7.0 5.2*  ALBUMIN 3.8 2.5*   No results for input(s): LIPASE, AMYLASE in the last 168 hours. No results for input(s): AMMONIA in the last 168 hours. Coagulation Profile: No results for input(s): INR, PROTIME in the last 168 hours. Cardiac Enzymes: No results for input(s): CKTOTAL, CKMB, CKMBINDEX, TROPONINI in the last 168 hours. BNP (last 3 results) No results for input(s): PROBNP in the last 8760 hours. HbA1C: No results for input(s): HGBA1C in the last 72 hours. CBG: Recent Labs  Lab 06/16/21 1749  GLUCAP 172*   Lipid Profile: No results for input(s): CHOL, HDL, LDLCALC, TRIG, CHOLHDL, LDLDIRECT in the last 72 hours. Thyroid Function Tests: No results for input(s): TSH, T4TOTAL, FREET4, T3FREE, THYROIDAB in the last 72 hours. Anemia Panel: No results for input(s): VITAMINB12, FOLATE, FERRITIN, TIBC, IRON, RETICCTPCT in the last 72 hours. Sepsis Labs: Recent Labs  Lab 06/15/21 2309 06/16/21 0630 06/16/21 0914 06/16/21 1318  LATICACIDVEN 4.0* 2.4* 5.5* 3.0*    Recent Results (from the past 240 hour(s))  Resp Panel by RT-PCR (Flu A&B, Covid) Nasopharyngeal Swab     Status: None   Collection Time: 06/15/21  4:39 PM   Specimen: Nasopharyngeal Swab; Nasopharyngeal(NP)  swabs in vial transport medium  Result Value Ref Range Status   SARS Coronavirus 2 by RT PCR NEGATIVE NEGATIVE Final    Comment: (NOTE) SARS-CoV-2 target nucleic acids are NOT DETECTED.  The SARS-CoV-2 RNA is generally detectable in upper respiratory specimens during the acute phase of infection. The lowest concentration of SARS-CoV-2 viral copies this assay can detect is 138 copies/mL. A negative result does not preclude SARS-Cov-2 infection and should not be used as the sole basis for treatment or other patient management decisions. A negative result may occur with  improper specimen collection/handling, submission of specimen other than nasopharyngeal swab, presence of viral mutation(s) within the areas targeted by this assay, and inadequate number of viral copies(<138 copies/mL). A negative result must be combined with clinical observations, patient history, and epidemiological information. The expected result is Negative.  Fact Sheet for Patients:  EntrepreneurPulse.com.au  Fact Sheet for Healthcare Providers:  IncredibleEmployment.be  This test is no t yet approved or cleared by the [place] FDA and  has been authorized for detection and/or diagnosis of SARS-CoV-2 by FDA under an Emergency Use Authorization (EUA). This EUA will remain  in effect (meaning this test can be used) for the duration of the COVID-19 declaration under Section 564(b)(1) of the Act, 21 U.S.C.section 360bbb-3(b)(1), unless the authorization is terminated  or revoked sooner.       Influenza A by PCR NEGATIVE NEGATIVE Final   Influenza B by PCR NEGATIVE NEGATIVE Final    Comment: (NOTE) The Xpert Xpress SARS-CoV-2/FLU/RSV plus assay is intended as an aid in the diagnosis of influenza from Nasopharyngeal swab specimens and should not be used as a sole basis for treatment. Nasal washings and aspirates are unacceptable for Xpert Xpress  SARS-CoV-2/FLU/RSV testing.  Fact Sheet for Patients: EntrepreneurPulse.com.au  Fact Sheet for Healthcare Providers: IncredibleEmployment.be  This test is not yet approved or cleared by the [place] FDA and has been authorized for detection and/or  diagnosis of SARS-CoV-2 by FDA under an Emergency Use Authorization (EUA). This EUA will remain in effect (meaning this test can be used) for the duration of the COVID-19 declaration under Section 564(b)(1) of the Act, 21 U.S.C. section 360bbb-3(b)(1), unless the authorization is terminated or revoked.  Performed at [facility], [address] [place], [place] [address]   MRSA Next Gen by PCR, Nasal     Status: None   Collection Time: 06/15/21 11:43 PM   Specimen: Nasal Mucosa; Nasal Swab  Result Value Ref Range Status   MRSA by PCR Next Gen NOT DETECTED NOT DETECTED Final    Comment: (NOTE) The GeneXpert MRSA Assay (FDA approved for NASAL specimens only), is one component of a comprehensive MRSA colonization surveillance program. It is not intended to diagnose MRSA infection nor to guide or monitor treatment for MRSA infections. Test performance is not FDA approved in patients less than 31 years old. Performed at [facility], [place] [address], [place], [place] [address]          Radiology Studies: No results found.      Scheduled Meds:  budesonide (PULMICORT) nebulizer solution  0.25 mg Nebulization BID   chlorhexidine  15 mL Mouth Rinse BID   Chlorhexidine Gluconate Cloth  6 each Topical Daily   doxycycline  100 mg Oral Q12H   enoxaparin (LOVENOX) injection  40 mg Subcutaneous [id number]   folic acid  1 mg Oral Daily   levalbuterol  1.25 mg Nebulization BID   losartan  100 mg Oral Daily   mouth rinse  15 mL Mouth Rinse q12n4p   [START ON 06/19/2021] methylPREDNISolone (SOLU-MEDROL) injection  40 mg Intravenous Q24H   multivitamin with minerals   1 tablet Oral Daily   OLANZapine zydis  10 mg Oral QHS   propranolol  10 mg Oral TID   sodium chloride flush  10-40 mL Intracatheter Q12H   thiamine  100 mg Oral Daily   Or   thiamine  100 mg Intravenous Daily   Continuous Infusions:  sodium chloride 10 mL/hr at 06/17/21 1811   cefTRIAXone (ROCEPHIN)  IV Stopped (06/18/21 1155)   dexmedetomidine (PRECEDEX) IV infusion Stopped (06/18/21 1245)     LOS: 3 days    Time spent: 30 minutes   Barb Merino, MD [facility] Pager [phone]

## 2021-06-18 NOTE — Progress Notes (Signed)
At approximately 1530 patient's cough worsened. Blood pressure trending upwardly, scheduled Propranolol administered per order.  Patient was coached on deep breathing, and to splint a pillow while coughing.   PRN dose of Guaifenesis was administered.   Will continue to monitor.

## 2021-06-18 NOTE — Progress Notes (Signed)
MD informed of BP trends.

## 2021-06-18 NOTE — Progress Notes (Signed)
Daughter Santiago Glad, took the laptop/charger home.

## 2021-06-19 DIAGNOSIS — J189 Pneumonia, unspecified organism: Secondary | ICD-10-CM | POA: Diagnosis not present

## 2021-06-19 LAB — PHOSPHORUS: Phosphorus: 3 mg/dL (ref 2.5–4.6)

## 2021-06-19 LAB — COMPREHENSIVE METABOLIC PANEL
ALT: 100 U/L — ABNORMAL HIGH (ref 0–44)
AST: 21 U/L (ref 15–41)
Albumin: 2.7 g/dL — ABNORMAL LOW (ref 3.5–5.0)
Alkaline Phosphatase: 82 U/L (ref 38–126)
Anion gap: 6 (ref 5–15)
BUN: 21 mg/dL (ref 8–23)
CO2: 35 mmol/L — ABNORMAL HIGH (ref 22–32)
Calcium: 8 mg/dL — ABNORMAL LOW (ref 8.9–10.3)
Chloride: 99 mmol/L (ref 98–111)
Creatinine, Ser: 0.62 mg/dL (ref 0.61–1.24)
GFR, Estimated: >60 mL/min (ref >60)
Glucose, Bld: 105 mg/dL — ABNORMAL HIGH (ref 70–99)
Potassium: 3.6 mmol/L (ref 3.5–5.1)
Sodium: 140 mmol/L (ref 135–145)
Total Bilirubin: 0.6 mg/dL (ref 0.3–1.2)
Total Protein: 5.4 g/dL — ABNORMAL LOW (ref 6.5–8.1)

## 2021-06-19 LAB — MAGNESIUM: Magnesium: 2.1 mg/dL (ref 1.7–2.4)

## 2021-06-19 MED ORDER — ALTEPLASE 2 MG IJ SOLR
2.0000 mg | Freq: Once | INTRAMUSCULAR | Status: AC
  Administered 2021-06-20: 2 mg
  Filled 2021-06-19 (×2): qty 2

## 2021-06-19 NOTE — Progress Notes (Signed)
NAME:  Antonio [name], MRN:  [id number], DOB:  1941/10/09, LOS: 4 ADMISSION DATE:  06/15/2021, CONSULTATION DATE: 06/16/2021 REFERRING MD:  Sloan Leiter - TRH CHIEF COMPLAINT:  Shortness of breath  History of Present Illness:   80 year old man who presented to [facility] ED 8/30 with cough, dyspnea and fever x 5 days.  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 History lists COPD, however patient denies history of this; he was not a smoker and does not use inhalers at home.  In the ED, patient required O2 via [place]; CT Chest was negative for PE but demonstrated post-inflammatory fibrosis and consolidative process in the LLL. He was admitted and started on ceftriaxone and doxycycline.  PCCM consulted for management.  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  Significant Hospital Events: Including procedures, antibiotic start and stop dates in addition to other pertinent events    8/31 - Admit to hospitalists, on [place], PCCM consult 9/02 - Agitated overnight requiring increased Precedex, ongoing O2 requirement (5LNC at rest during the day, BiPAP at night). Ongoing productive cough despite multiple neb treatments/steroids.  Interim History / Subjective:  Agitated overnight again.  Precedex not weaned off.  Got Zyprexa.  Did not trial IV Haldol.  Precedex discontinued today.  Cough and congestion improved per patient.  Objective:  Blood pressure 122/67, pulse 64, temperature 97.8 F (36.6 C), temperature source Oral, resp. rate (!) 22, height 6' (1.829 m), weight 86.2 kg, SpO2 (!) 85 %.    FiO2 (%):  [35 %] 35 %   Intake/Output Summary (Last 24 hours) at 06/19/2021 1348 Last data filed at 06/19/2021 0600 Gross per 24 hour  Intake 1030.87 ml  Output 4525 ml  Net -3494.13 ml    Filed Weights   06/15/21 1616  Weight: 86.2 kg   Physical Examination: General: Acutely ill-appearing elderly man in NAD. Sitting up in bed, alert. HEENT: [place]/AT, anicteric sclera, PERRL, moist mucous membranes.  in place. Neuro: Awake, oriented x 4. Responds to verbal stimuli. Following commands consistently. Moves all 4 extremities spontaneously. Strength 4-5/5 in all 4 extremities. CV: RRR, no m/g/r. PULM: Breathing even and unlabored on 5LNC, coughing intermittently. Lung fields diminished at bilateral bases, L > R. Expiratory wheeze noted. GI: Soft, nontender, nondistended. Normoactive bowel sounds. Extremities: Trace BLE edema noted. Skin: Warm/dry, no rashes.  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 --Continue community acquired pneumonia antibiotic coverage --Steroids given history of pneumonitis/post-COVID fibrosis.  1 mg/kg for 3 days, decreased to 0.5 mg/kg  Solu-Medrol daily 9/2 given delirium, anticipate transition to PO  9/4 --Trial off BiPAP tonight --Supplemental O2, wean for sats greater than 88% --Bronchodilators as needed  Toxic metabolic encephalopathy: Appears quite clear, mental status okay this morning.  Worse overnight.  Waxing and waning.  Seems consistent with delirium.  Advocate for stopping Precedex.  Trial of antipsychotics. --Wean off Precedex --IV haldol PRN, Zyprexa qhs --Avoid benzos   Cardiomyopathy: EF 50%, slightly low, with diastolic dysfunction, diminished RV function on TTE 10/2020. --CTM  Best Practice (right click and "Reselect all SmartList Selections" daily)   Per Primary Team  Critical care time: N/A   Lanier Clam, MD [facility] 06/19/21 1:48 PM  Please see [email or web address] for pager details.  From 7A-7P if no response, please call [phone] After hours, please call ELink [phone]

## 2021-06-19 NOTE — Plan of Care (Signed)
Unable to manage patient without titrating Precedex to 0.4 mg even after giving first dose Zyprexa. Patient disoriented, seeing unseen others, CIWA score jumped to >20, and frequently pulling off BIPAP during the night. Patient output is no less than 500 every four hours, while intake over shift is no greater than 480 ml. Will wean Precedex as tolerated.   Problem: Education: Goal: Knowledge of General Education information will improve Description: Including pain rating scale, medication(s)/side effects and non-pharmacologic comfort measures Outcome: Progressing   Problem: Health Behavior/Discharge Planning: Goal: Ability to manage health-related needs will improve Outcome: Progressing   Problem: Clinical Measurements: Goal: Ability to maintain clinical measurements within normal limits will improve Outcome: Progressing Goal: Will remain free from infection Outcome: Progressing Goal: Diagnostic test results will improve Outcome: Progressing Goal: Respiratory complications will improve Outcome: Progressing Goal: Cardiovascular complication will be avoided Outcome: Progressing   Problem: Activity: Goal: Risk for activity intolerance will decrease Outcome: Progressing   Problem: Nutrition: Goal: Adequate nutrition will be maintained Outcome: Progressing   Problem: Coping: Goal: Level of anxiety will decrease Outcome: Progressing   Problem: Elimination: Goal: Will not experience complications related to bowel motility Outcome: Progressing Goal: Will not experience complications related to urinary retention Outcome: Progressing   Problem: Pain Managment: Goal: General experience of comfort will improve Outcome: Progressing   Problem: Safety: Goal: Ability to remain free from injury will improve Outcome: Progressing   Problem: Skin Integrity: Goal: Risk for impaired skin integrity will decrease Outcome: Progressing   Problem: Education: Goal: Knowledge of disease or  condition will improve Outcome: Progressing Goal: Understanding of discharge needs will improve Outcome: Progressing   Problem: Health Behavior/Discharge Planning: Goal: Ability to identify changes in lifestyle to reduce recurrence of condition will improve Outcome: Progressing Goal: Identification of resources available to assist in meeting health care needs will improve Outcome: Progressing   Problem: Physical Regulation: Goal: Complications related to the disease process, condition or treatment will be avoided or minimized Outcome: Progressing   Problem: Safety: Goal: Ability to remain free from injury will improve Outcome: Progressing

## 2021-06-19 NOTE — Progress Notes (Signed)
PROGRESS NOTE    Antonio [name]  [id number] DOB: November 10, 1940 DOA: 06/15/2021 PCP: [name], MD    Brief Narrative:  80 year old male with history of B-cell lymphoma, hypertension COPD and hyperlipidemia, COVID-19 infection with prolonged hospitalization in January 2022 presented with shortness of breath and cough worse for last 5 days.  Reported temperature 101 at home.  In the emergency room, tachycardic and tachypneic.  Blood pressure stable.  84% on room air.  Placed on BiPAP.  CTA showed no PE, lactic acid 4, CT scan with bilateral upper lobe fibrosis superimposed pneumonia. Patient admitted on BiPAP. 9/1, overnight with agitation.  Suspected alcohol withdrawal.  Hypersensitivity to Ativan and started on Precedex drip. Remains on precedex with intermittent confusion and agitation.   Assessment & Plan:   Principal Problem:   Multifocal pneumonia Active Problems:   Essential hypertension   History of B-cell lymphoma   Acute respiratory failure with hypoxia (HCC)   Acute exacerbation of chronic obstructive pulmonary disease (COPD) (HCC)   Sepsis (HCC)   LBBB (left bundle branch block)  Multifocal pneumonia: Acute hypoxemic respiratory failure secondary to multifocal pneumonia and chronic lung disease due to COVID-19 infection. Is doing some clinical improvement, however on about 3 to 6 L of oxygen at the daytime and using BiPAP as needed for respiratory distress.  Confusion and agitation complicating oxygen requirement. Blood cultures negative so far. Remains on doxycycline and Rocephin that we will continue for 7 days. 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 Start mobilizing today with PT OT. Incentive spirometry and flutter valve.  Bronchodilator therapy with albuterol. Keep on oxygen to keep saturation more than 90%.  Sepsis present on admission:Sepsis present as the patient has 2 of the following  Lactic acid  4 RR >20 And suspect his source of infections is pneumonia Stabilizing.  Blood pressures are adequate and elevated.  Essential hypertension: Blood pressure stable on losartan and Inderal.  Continue propanolol to avoid tachycardia.  COPD: As above.  Chronic left bundle branch block: Stable.  Electrolytes: Replaced and adequate.  Testicular cancer status post bilateral orchiectomy: On high-dose testosterone at home.  Patient can use their own.  Acute metabolic encephalopathy /anxiety/agitation: Patient consumes about 14 drinks of alcohol in a week.  Also has baseline tremors and steroid-induced tremors.  He had episode of tremulousness and agitation that was thought to be alcohol withdrawal. 8/31, 2 mg Ativan resulted in obtundation needing reversal with flumazenil. Still on Precedex.  Started on Haldol and Zyprexa with plan to taper off Precedex.  Start mobilizing with PT OT today.  Fall precautions and delirium precautions.   DVT prophylaxis: enoxaparin (LOVENOX) injection 40 mg Start: 06/16/21 1000   Code Status: Full code Family Communication: wife on the phone every day.  We will call. Disposition Plan: Status is: Inpatient  Remains inpatient appropriate because:Inpatient level of care appropriate due to severity of illness  Dispo: The patient is from: Home              Anticipated d/c is to: Home              Patient currently is not medically stable to d/c.   Difficult to place patient No   Remains in ICU , needing precedex for agitation       Consultants:  PCCM  Procedures:  None  Antimicrobials:  Anti-infectives (From admission, onward)    Start     Dose/Rate Route Frequency Ordered Stop  06/17/21 1130  doxycycline (VIBRA-TABS) tablet 100 mg        100 mg Oral Every 12 hours 06/17/21 1038     06/16/21 2200  cefTRIAXone (ROCEPHIN) 1 g in sodium chloride 0.9 % 100 mL IVPB  Status:  Discontinued        1 g 200 mL/hr over 30 Minutes Intravenous Every 24  hours 06/16/21 0038 06/16/21 0851   06/16/21 1200  cefTRIAXone (ROCEPHIN) 2 g in sodium chloride 0.9 % 100 mL IVPB        2 g 200 mL/hr over 30 Minutes Intravenous Every 24 hours 06/16/21 0851     06/16/21 0000  doxycycline (VIBRAMYCIN) 100 mg in sodium chloride 0.9 % 250 mL IVPB  Status:  Discontinued        100 mg 125 mL/hr over 120 Minutes Intravenous 2 times daily 06/15/21 2348 06/17/21 1038   06/15/21 2145  cefTRIAXone (ROCEPHIN) 1 g in sodium chloride 0.9 % 100 mL IVPB        1 g 200 mL/hr over 30 Minutes Intravenous  Once 06/15/21 2144 06/16/21 0031   06/15/21 2145  azithromycin (ZITHROMAX) 500 mg in sodium chloride 0.9 % 250 mL IVPB  Status:  Discontinued        500 mg 250 mL/hr over 60 Minutes Intravenous  Once 06/15/21 2144 06/15/21 2330          Subjective: Patient seen and examined.  Overnight he stayed on BiPAP.  Had some periods of agitation, back on Precedex at 0.3 mcg/kg/h. Patient is alert and awake, denies any shortness of breath.  He is just frustrated but unable to tell me why. Looks frustrated.  Looks comfortable breathing. Nursing reported patient complaining of visual hallucination.   Objective: Vitals:   06/19/21 0409 06/19/21 0500 06/19/21 0600 06/19/21 0700  BP: (!) 101/52 (!) 85/50 136/70 121/66  Pulse: 66 61 70 64  Resp: (!) 23 19 (!) 23 (!) 21  Temp:      TempSrc:      SpO2: 95% 92% 95% 96%  Weight:      Height:        Intake/Output Summary (Last 24 hours) at 06/19/2021 0750 Last data filed at 06/19/2021 0600 Gross per 24 hour  Intake 2356.65 ml  Output 4725 ml  Net -2368.35 ml   Filed Weights   06/15/21 1616  Weight: 86.2 kg    Examination: General: Looks fairly comfortable while on 35% FiO2 on BiPAP.  Anxious on his stimulation. Cardiovascular: S1-S2 normal.  Regular rate rhythm. Respiratory: Bilateral upper airway conducted sounds.  Scattered expiratory wheezes. Gastrointestinal: Soft and nontender.  Bowel sounds present. Ext: No  cyanosis or edema. Neuro: Patient is alert and awake.  He is impulsive and angry intermittently. Patient himself denies any hallucinations or delusions, however he has some tangential thoughts and easily distractible. Musculoskeletal: No deformities.      Data Reviewed: I have personally reviewed following labs and imaging studies  CBC: Recent Labs  Lab 06/15/21 1639 06/15/21 1650 06/16/21 0623 06/17/21 0430 06/18/21 0430  WBC 6.7  --  9.3 7.3 12.5*  NEUTROABS 4.1  --   --  5.4 9.7*  HGB 14.2 15.0 14.1 13.0 12.5*  HCT 42.9 44.0 44.1 41.1 39.9  MCV 100.9*  --  104.3* 107.0* 107.3*  PLT 183  --  208 183 [id number]   Basic Metabolic Panel: Recent Labs  Lab 06/15/21 1639 06/15/21 1650 06/16/21 0623 06/17/21 0430 06/18/21 0430 06/19/21 0506  NA 135 133*  139 143 138 140  K 3.9 3.8 4.0 4.4 4.3 3.6  CL 96*  --  101 105 99 99  CO2 28  --  28 31 34* 35*  GLUCOSE 128*  --  148* 141* 133* 105*  BUN 13  --  11 23 28* 21  CREATININE 0.60*  --  0.68 0.52* 0.73 0.62  CALCIUM 9.1  --  8.9 8.8* 8.2* 8.0*  MG  --   --   --  2.3 2.2 2.1  PHOS  --   --   --  1.7* 1.9* 3.0   GFR: Estimated Creatinine Clearance: 78.1 mL/min (by C-G formula based on SCr of 0.62 mg/dL). Liver Function Tests: Recent Labs  Lab 06/15/21 1639 06/18/21 0430 06/19/21 0506  AST 47* 36 21  ALT 71* 148* 100*  ALKPHOS 60 86 82  BILITOT 0.9 0.5 0.6  PROT 7.0 5.2* 5.4*  ALBUMIN 3.8 2.5* 2.7*   No results for input(s): LIPASE, AMYLASE in the last 168 hours. No results for input(s): AMMONIA in the last 168 hours. Coagulation Profile: No results for input(s): INR, PROTIME in the last 168 hours. Cardiac Enzymes: No results for input(s): CKTOTAL, CKMB, CKMBINDEX, TROPONINI in the last 168 hours. BNP (last 3 results) No results for input(s): PROBNP in the last 8760 hours. HbA1C: No results for input(s): HGBA1C in the last 72 hours. CBG: Recent Labs  Lab 06/16/21 1749  GLUCAP 172*   Lipid Profile: No  results for input(s): CHOL, HDL, LDLCALC, TRIG, CHOLHDL, LDLDIRECT in the last 72 hours. Thyroid Function Tests: No results for input(s): TSH, T4TOTAL, FREET4, T3FREE, THYROIDAB in the last 72 hours. Anemia Panel: No results for input(s): VITAMINB12, FOLATE, FERRITIN, TIBC, IRON, RETICCTPCT in the last 72 hours. Sepsis Labs: Recent Labs  Lab 06/15/21 2309 06/16/21 0630 06/16/21 0914 06/16/21 1318  LATICACIDVEN 4.0* 2.4* 5.5* 3.0*    Recent Results (from the past 240 hour(s))  Resp Panel by RT-PCR (Flu A&B, Covid) Nasopharyngeal Swab     Status: None   Collection Time: 06/15/21  4:39 PM   Specimen: Nasopharyngeal Swab; Nasopharyngeal(NP) swabs in vial transport medium  Result Value Ref Range Status   SARS Coronavirus 2 by RT PCR NEGATIVE NEGATIVE Final    Comment: (NOTE) SARS-CoV-2 target nucleic acids are NOT DETECTED.  The SARS-CoV-2 RNA is generally detectable in upper respiratory specimens during the acute phase of infection. The lowest concentration of SARS-CoV-2 viral copies this assay can detect is 138 copies/mL. A negative result does not preclude SARS-Cov-2 infection and should not be used as the sole basis for treatment or other patient management decisions. A negative result may occur with  improper specimen collection/handling, submission of specimen other than nasopharyngeal swab, presence of viral mutation(s) within the areas targeted by this assay, and inadequate number of viral copies(<138 copies/mL). A negative result must be combined with clinical observations, patient history, and epidemiological information. The expected result is Negative.  Fact Sheet for Patients:  EntrepreneurPulse.com.au  Fact Sheet for Healthcare Providers:  IncredibleEmployment.be  This test is no t yet approved or cleared by the [place] FDA and  has been authorized for detection and/or diagnosis of SARS-CoV-2 by FDA under an Emergency Use  Authorization (EUA). This EUA will remain  in effect (meaning this test can be used) for the duration of the COVID-19 declaration under Section 564(b)(1) of the Act, 21 U.S.C.section 360bbb-3(b)(1), unless the authorization is terminated  or revoked sooner.       Influenza A by  PCR NEGATIVE NEGATIVE Final   Influenza B by PCR NEGATIVE NEGATIVE Final    Comment: (NOTE) The Xpert Xpress SARS-CoV-2/FLU/RSV plus assay is intended as an aid in the diagnosis of influenza from Nasopharyngeal swab specimens and should not be used as a sole basis for treatment. Nasal washings and aspirates are unacceptable for Xpert Xpress SARS-CoV-2/FLU/RSV testing.  Fact Sheet for Patients: EntrepreneurPulse.com.au  Fact Sheet for Healthcare Providers: IncredibleEmployment.be  This test is not yet approved or cleared by the [place] FDA and has been authorized for detection and/or diagnosis of SARS-CoV-2 by FDA under an Emergency Use Authorization (EUA). This EUA will remain in effect (meaning this test can be used) for the duration of the COVID-19 declaration under Section 564(b)(1) of the Act, 21 U.S.C. section 360bbb-3(b)(1), unless the authorization is terminated or revoked.  Performed at [facility], [address] [place], [place] [address]   MRSA Next Gen by PCR, Nasal     Status: None   Collection Time: 06/15/21 11:43 PM   Specimen: Nasal Mucosa; Nasal Swab  Result Value Ref Range Status   MRSA by PCR Next Gen NOT DETECTED NOT DETECTED Final    Comment: (NOTE) The GeneXpert MRSA Assay (FDA approved for NASAL specimens only), is one component of a comprehensive MRSA colonization surveillance program. It is not intended to diagnose MRSA infection nor to guide or monitor treatment for MRSA infections. Test performance is not FDA approved in patients less than 74 years old. Performed at [facility], [place]  [address], [place], [place] [address]          Radiology Studies: No results found.      Scheduled Meds:  budesonide (PULMICORT) nebulizer solution  0.25 mg Nebulization BID   chlorhexidine  15 mL Mouth Rinse BID   Chlorhexidine Gluconate Cloth  6 each Topical Daily   doxycycline  100 mg Oral Q12H   enoxaparin (LOVENOX) injection  40 mg Subcutaneous [id number]   folic acid  1 mg Oral Daily   levalbuterol  1.25 mg Nebulization BID   losartan  100 mg Oral Daily   mouth rinse  15 mL Mouth Rinse q12n4p   methylPREDNISolone (SOLU-MEDROL) injection  40 mg Intravenous Q24H   multivitamin with minerals  1 tablet Oral Daily   OLANZapine zydis  10 mg Oral QHS   phosphorus  500 mg Oral TID   propranolol  10 mg Oral TID   sodium chloride flush  10-40 mL Intracatheter Q12H   Testosterone  40.5 mg Topical Daily   thiamine  100 mg Oral Daily   Or   thiamine  100 mg Intravenous Daily   Continuous Infusions:  sodium chloride 10 mL/hr at 06/17/21 1811   cefTRIAXone (ROCEPHIN)  IV Stopped (06/18/21 1155)   dexmedetomidine (PRECEDEX) IV infusion 0.3 mcg/kg/hr (06/19/21 0600)     LOS: 4 days    Time spent: 30 minutes   Barb Merino, MD [facility] Pager [phone]

## 2021-06-19 NOTE — Evaluation (Signed)
Physical Therapy Evaluation Patient Details Name: Antonio [name] MRN: [id number] DOB: 04-23-1941 Today's Date: 06/19/2021   History of Present Illness  80 year old male with history of B-cell lymphoma, hypertension, COPD, hyperlipidemia, essential tremor, COVID-19 infection with prolonged hospitalization in January 2022 presented with shortness of breath and cough worse for last 5 days.  Pt admitted 06/15/21 for Multifocal pneumonia: Acute hypoxemic respiratory failure secondary to multifocal pneumonia and chronic lung disease due to COVID-19 infection and sepsis present on admission.  Clinical Impression  Pt admitted with above diagnosis. Pt currently with functional limitations due to the deficits listed below (see PT Problem List). Pt will benefit from skilled PT to increase their independence and safety with mobility to allow discharge to the venue listed below.  Pt reading newspaper in bed on arrival to room.   Pt reports [place] had [place].  Pt states he wasn't wearing oxygen at home however felt like he needed oxygen leading to admission.  Pt assisted with ambulating in hallway and then set up in recliner end of session.     Follow Up Recommendations Home health PT    Equipment Recommendations  Rolling walker with 5" wheels (pt uncertain if he still has DME at home)    Recommendations for Other Services       Precautions / Restrictions Precautions Precautions: Fall Precaution Comments: monitor sats      Mobility  Bed Mobility Overal bed mobility: Needs Assistance Bed Mobility: Supine to Sit     Supine to sit: Supervision;HOB elevated     General bed mobility comments: supervision for lines/leads    Transfers Overall transfer level: Needs assistance Equipment used: Rolling walker (2 wheeled) Transfers: Sit to/from Stand Sit to Stand: Min guard         General transfer comment: min/guard for  safety  Ambulation/Gait Ambulation/Gait assistance: Min guard Gait Distance (Feet): 200 Feet Assistive device: Rolling walker (2 wheeled) Gait Pattern/deviations: Step-through pattern;Decreased stride length     General Gait Details: slow but steady pace with RW, SpO2 dropped to 88% on 2L so returned to 3L and SPO2 92%  Stairs            Wheelchair Mobility    Modified Rankin (Stroke Patients Only)       Balance Overall balance assessment: Needs assistance         Standing balance support: No upper extremity supported Standing balance-Leahy Scale: Fair                               Pertinent Vitals/Pain Pain Assessment: No/denies pain    Home Living   Living Arrangements: Spouse/significant other Available Help at Discharge: Family;Available 24 hours/day Type of Home: Independent living facility Home Access: Level entry     Home Layout: One level Home Equipment: Toilet riser;Grab bars - tub/shower;Grab bars - toilet;Walker - 2 wheels;Shower seat;Other (comment)      Prior Function Level of Independence: Independent         Comments: Pt was fully independent.  He travels frequently for duck and quail hunting trips, and enjoys fishing.  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)     Hand Dominance        Extremity/Trunk Assessment        Lower Extremity Assessment Lower Extremity Assessment: Generalized weakness    Cervical / Trunk Assessment Cervical / Trunk Assessment: Kyphotic  Communication  Communication: HOH  Cognition Arousal/Alertness: Awake/alert Behavior During Therapy: WFL for tasks assessed/performed Overall Cognitive Status: Within Functional Limits for tasks assessed                                        General Comments      Exercises     Assessment/Plan    PT Assessment Patient needs continued PT services  PT Problem List Decreased strength;Decreased activity tolerance;Decreased  knowledge of use of DME;Decreased mobility;Cardiopulmonary status limiting activity       PT Treatment Interventions Gait training;DME instruction;Therapeutic exercise;Balance training;Functional mobility training;Therapeutic activities;Patient/family education    PT Goals (Current goals can be found in the Care Plan section)  Acute Rehab PT Goals PT Goal Formulation: With patient Time For Goal Achievement: 07/03/21 Potential to Achieve Goals: Good    Frequency Min 3X/week   Barriers to discharge        Co-evaluation               AM-PAC PT "6 Clicks" Mobility  Outcome Measure Help needed turning from your back to your side while in a flat bed without using bedrails?: A Little Help needed moving from lying on your back to sitting on the side of a flat bed without using bedrails?: A Little Help needed moving to and from a bed to a chair (including a wheelchair)?: A Little Help needed standing up from a chair using your arms (e.g., wheelchair or bedside chair)?: A Little Help needed to walk in hospital room?: A Little Help needed climbing 3-5 steps with a railing? : A Lot 6 Click Score: 17    End of Session Equipment Utilized During Treatment: Gait belt;Oxygen Activity Tolerance: Patient tolerated treatment well Patient left: in chair;with call bell/phone within reach;with chair alarm set Nurse Communication: Mobility status PT Visit Diagnosis: Difficulty in walking, not elsewhere classified (R26.2)    Time: [id number] PT Time Calculation (min) (ACUTE ONLY): 21 min   Charges:   PT Evaluation $PT Eval Low Complexity: 1 Low        Kati PT, DPT Acute Rehabilitation Services Pager: [phone] Office: [phone]   York Ram E 06/19/2021, 12:46 PM

## 2021-06-20 DIAGNOSIS — J189 Pneumonia, unspecified organism: Secondary | ICD-10-CM | POA: Diagnosis not present

## 2021-06-20 LAB — COMPREHENSIVE METABOLIC PANEL
ALT: 84 U/L — ABNORMAL HIGH (ref 0–44)
AST: 21 U/L (ref 15–41)
Albumin: 3.1 g/dL — ABNORMAL LOW (ref 3.5–5.0)
Alkaline Phosphatase: 84 U/L (ref 38–126)
Anion gap: 10 (ref 5–15)
BUN: 17 mg/dL (ref 8–23)
CO2: 34 mmol/L — ABNORMAL HIGH (ref 22–32)
Calcium: 8.2 mg/dL — ABNORMAL LOW (ref 8.9–10.3)
Chloride: 96 mmol/L — ABNORMAL LOW (ref 98–111)
Creatinine, Ser: 0.64 mg/dL (ref 0.61–1.24)
GFR, Estimated: >60 mL/min (ref >60)
Glucose, Bld: 113 mg/dL — ABNORMAL HIGH (ref 70–99)
Potassium: 3.7 mmol/L (ref 3.5–5.1)
Sodium: 140 mmol/L (ref 135–145)
Total Bilirubin: 0.7 mg/dL (ref 0.3–1.2)
Total Protein: 6 g/dL — ABNORMAL LOW (ref 6.5–8.1)

## 2021-06-20 MED ORDER — OLANZAPINE 5 MG PO TBDP
10.0000 mg | ORAL_TABLET | Freq: Every evening | ORAL | Status: DC | PRN
  Filled 2021-06-20: qty 2

## 2021-06-20 MED ORDER — PREDNISONE 20 MG PO TABS
30.0000 mg | ORAL_TABLET | Freq: Every day | ORAL | Status: DC
  Administered 2021-06-20 – 2021-06-21 (×2): 30 mg via ORAL
  Filled 2021-06-20 (×2): qty 1

## 2021-06-20 MED ORDER — LORATADINE 10 MG PO TABS
10.0000 mg | ORAL_TABLET | Freq: Every day | ORAL | Status: DC
  Administered 2021-06-20 – 2021-06-21 (×2): 10 mg via ORAL
  Filled 2021-06-20: qty 1

## 2021-06-20 NOTE — Progress Notes (Signed)
Talked with patient about wearing BIPAP tonight, he states "he doesn't wear one at home, never wore one, don't need one and didn't want one here". RT and machine available if patient changes his mind.

## 2021-06-20 NOTE — Progress Notes (Signed)
No bipap tonight. No resp distress noted pt is resting. RN aware. Bipap stand by if needed.

## 2021-06-20 NOTE — TOC Progression Note (Signed)
Transition of Care [facility]) - Progression Note    Patient Details  Name: Antonio [name] MRN: [id number] Date of Birth: [birth year]  [facility]) CM/SW [place], LCSW Phone Number: 06/20/2021, 11:34 AM  Clinical Narrative:    CSW spoke with pt's daughter [name], she reported pt will be going back to [facility] for short term Rehab. CSW confirmed with Santiago Glad at [facility], bed Rm 158 for d/c tomorrow. CSW to complete FL2.   Expected Discharge Plan: Home/Self Care Barriers to Discharge: Continued Medical Work up  Expected Discharge Plan and Services Expected Discharge Plan: Home/Self Care   Discharge Planning Services: CM Consult   Living arrangements for the past 2 months: Single Family Home                                       Social Determinants of Health (SDOH) Interventions    Readmission Risk Interventions No flowsheet data found.

## 2021-06-20 NOTE — Progress Notes (Signed)
Pt has arrived to [place]. VS completed.

## 2021-06-20 NOTE — Progress Notes (Signed)
PROGRESS NOTE    Antonio [name]  [id number] DOB: May 11, 1941 DOA: 06/15/2021 PCP: [name], MD    Brief Narrative:  80 year old male with history of B-cell lymphoma, hypertension COPD and hyperlipidemia, COVID-19 infection with prolonged hospitalization in January 2022 presented with shortness of breath and cough worse for last 5 days.  Reported temperature 101 at home.  In the emergency room, tachycardic and tachypneic.  Blood pressure stable.  84% on room air.  Placed on BiPAP.  CTA showed no PE, lactic acid 4, CT scan with bilateral upper lobe fibrosis superimposed pneumonia. Patient admitted on BiPAP. 9/1, overnight with agitation.  Suspected alcohol withdrawal.  Hypersensitivity to Ativan and started on Precedex drip. 9/4, symptoms mostly improved.  We used Zyprexa last night.   Assessment & Plan:   Principal Problem:   Multifocal pneumonia Active Problems:   Essential hypertension   History of B-cell lymphoma   Acute respiratory failure with hypoxia (HCC)   Acute exacerbation of chronic obstructive pulmonary disease (COPD) (HCC)   Sepsis (HCC)   LBBB (left bundle branch block)  Multifocal pneumonia: Acute hypoxemic respiratory failure secondary to multifocal pneumonia and chronic lung disease due to COVID-19 infection. Much clinical improvement today.  Currently on 2 L oxygen.  Did not use BiPAP last night.  All blood cultures are negative so far. Continue prednisone, tapering today. Complete antibiotic therapy with Rocephin and doxycycline, day 5/7. Continue bronchodilator therapy, chest physiotherapy.  Mobilize. Transfer to [facility] bed today.  Sepsis present on admission:Sepsis present as the patient has 2 of the following  Lactic acid 4 RR >20 And suspect his source of infections is pneumonia Stabilizing.  Blood pressures are adequate improved.  Essential hypertension: Blood pressure stable on losartan and Inderal.  Continue propanolol to avoid  tachycardia.  COPD: As above.  Chronic left bundle branch block: Stable.  Electrolytes: Replaced and adequate.  Testicular cancer status post bilateral orchiectomy: On high-dose testosterone at home.  Patient can use their own.  Acute metabolic encephalopathy /anxiety/agitation: Patient consumes about 14 drinks of alcohol in a week.  Also has baseline tremors and steroid-induced tremors.  He had episode of tremulousness and agitation that was thought to be alcohol withdrawal. 8/31, 2 mg Ativan resulted in obtundation needing reversal with flumazenil. Treated with Precedex infusion.  Taken off last 24 hours. Used Zyprexa 10 mg last night.  Mental status improved.  Start mobilizing.  Transfer to MedSurg bed. Anticipate discharge home tomorrow.  May need supplemental oxygen.  Will need oxygen evaluation nearing discharge.   DVT prophylaxis: enoxaparin (LOVENOX) injection 40 mg Start: 06/16/21 1000   Code Status: Full code Family Communication: None today. Disposition Plan: Status is: Inpatient  Remains inpatient appropriate because:Inpatient level of care appropriate due to severity of illness  Dispo: The patient is from: Home              Anticipated d/c is to: Home with home health.              Patient currently is not medically stable to d/c.   Difficult to place patient No       Consultants:  PCCM  Procedures:  None  Antimicrobials:  Anti-infectives (From admission, onward)    Start     Dose/Rate Route Frequency Ordered Stop   06/17/21 1130  doxycycline (VIBRA-TABS) tablet 100 mg        100 mg Oral Every 12 hours 06/17/21 1038     06/16/21 2200  cefTRIAXone (ROCEPHIN) 1 g  in sodium chloride 0.9 % 100 mL IVPB  Status:  Discontinued        1 g 200 mL/hr over 30 Minutes Intravenous Every 24 hours 06/16/21 0038 06/16/21 0851   06/16/21 1200  cefTRIAXone (ROCEPHIN) 2 g in sodium chloride 0.9 % 100 mL IVPB        2 g 200 mL/hr over 30 Minutes Intravenous Every 24  hours 06/16/21 0851     06/16/21 0000  doxycycline (VIBRAMYCIN) 100 mg in sodium chloride 0.9 % 250 mL IVPB  Status:  Discontinued        100 mg 125 mL/hr over 120 Minutes Intravenous 2 times daily 06/15/21 2348 06/17/21 1038   06/15/21 2145  cefTRIAXone (ROCEPHIN) 1 g in sodium chloride 0.9 % 100 mL IVPB        1 g 200 mL/hr over 30 Minutes Intravenous  Once 06/15/21 2144 06/16/21 0031   06/15/21 2145  azithromycin (ZITHROMAX) 500 mg in sodium chloride 0.9 % 250 mL IVPB  Status:  Discontinued        500 mg 250 mL/hr over 60 Minutes Intravenous  Once 06/15/21 2144 06/15/21 2330          Subjective: Patient seen and examined.  Did not use BiPAP last night.  Currently on 2 L oxygen.  Looks comfortable and mostly interactive and conversant.  No overnight events.  Did not use Precedex since yesterday afternoon. He is looking forward to go home with rehab at home.   Objective: Vitals:   06/20/21 0400 06/20/21 0500 06/20/21 0739 06/20/21 0800  BP:  (!) 120/57    Pulse: 92 65    Resp: 18 18    Temp:    98.3 F (36.8 C)  TempSrc:    Axillary  SpO2: 97% 98% 95%   Weight:      Height:        Intake/Output Summary (Last 24 hours) at 06/20/2021 0831 Last data filed at 06/20/2021 0341 Gross per 24 hour  Intake 733.48 ml  Output 2400 ml  Net -1666.52 ml   Filed Weights   06/15/21 1616  Weight: 86.2 kg    Examination: General: Looks fairly comfortable on 2 L oxygen.  Sitting in chair and ready to eat breakfast. Cardiovascular: S1-S2 normal.  Regular rate rhythm. Respiratory: Bilateral clear.  No added sounds today.  Comfortable on 2 L oxygen. Gastrointestinal: Soft and nontender.  Bowel sounds present. Ext: No cyanosis or edema. Neuro: Is alert and oriented.  No evidence of delusion or hallucinations today.     Data Reviewed: I have personally reviewed following labs and imaging studies  CBC: Recent Labs  Lab 06/15/21 1639 06/15/21 1650 06/16/21 0623 06/17/21 0430  06/18/21 0430  WBC 6.7  --  9.3 7.3 12.5*  NEUTROABS 4.1  --   --  5.4 9.7*  HGB 14.2 15.0 14.1 13.0 12.5*  HCT 42.9 44.0 44.1 41.1 39.9  MCV 100.9*  --  104.3* 107.0* 107.3*  PLT 183  --  208 183 [id number]   Basic Metabolic Panel: Recent Labs  Lab 06/16/21 0623 06/17/21 0430 06/18/21 0430 06/19/21 0506 06/20/21 0304  NA 139 143 138 140 140  K 4.0 4.4 4.3 3.6 3.7  CL 101 105 99 99 96*  CO2 28 31 34* 35* 34*  GLUCOSE 148* 141* 133* 105* 113*  BUN 11 23 28* 21 17  CREATININE 0.68 0.52* 0.73 0.62 0.64  CALCIUM 8.9 8.8* 8.2* 8.0* 8.2*  MG  --  2.3 2.2  2.1  --   PHOS  --  1.7* 1.9* 3.0  --    GFR: Estimated Creatinine Clearance: 78.1 mL/min (by C-G formula based on SCr of 0.64 mg/dL). Liver Function Tests: Recent Labs  Lab 06/15/21 1639 06/18/21 0430 06/19/21 0506 06/20/21 0304  AST 47* 36 21 21  ALT 71* 148* 100* 84*  ALKPHOS 60 86 82 84  BILITOT 0.9 0.5 0.6 0.7  PROT 7.0 5.2* 5.4* 6.0*  ALBUMIN 3.8 2.5* 2.7* 3.1*   No results for input(s): LIPASE, AMYLASE in the last 168 hours. No results for input(s): AMMONIA in the last 168 hours. Coagulation Profile: No results for input(s): INR, PROTIME in the last 168 hours. Cardiac Enzymes: No results for input(s): CKTOTAL, CKMB, CKMBINDEX, TROPONINI in the last 168 hours. BNP (last 3 results) No results for input(s): PROBNP in the last 8760 hours. HbA1C: No results for input(s): HGBA1C in the last 72 hours. CBG: Recent Labs  Lab 06/16/21 1749  GLUCAP 172*   Lipid Profile: No results for input(s): CHOL, HDL, LDLCALC, TRIG, CHOLHDL, LDLDIRECT in the last 72 hours. Thyroid Function Tests: No results for input(s): TSH, T4TOTAL, FREET4, T3FREE, THYROIDAB in the last 72 hours. Anemia Panel: No results for input(s): VITAMINB12, FOLATE, FERRITIN, TIBC, IRON, RETICCTPCT in the last 72 hours. Sepsis Labs: Recent Labs  Lab 06/15/21 2309 06/16/21 0630 06/16/21 0914 06/16/21 1318  LATICACIDVEN 4.0* 2.4* 5.5* 3.0*    Recent  Results (from the past 240 hour(s))  Resp Panel by RT-PCR (Flu A&B, Covid) Nasopharyngeal Swab     Status: None   Collection Time: 06/15/21  4:39 PM   Specimen: Nasopharyngeal Swab; Nasopharyngeal(NP) swabs in vial transport medium  Result Value Ref Range Status   SARS Coronavirus 2 by RT PCR NEGATIVE NEGATIVE Final    Comment: (NOTE) SARS-CoV-2 target nucleic acids are NOT DETECTED.  The SARS-CoV-2 RNA is generally detectable in upper respiratory specimens during the acute phase of infection. The lowest concentration of SARS-CoV-2 viral copies this assay can detect is 138 copies/mL. A negative result does not preclude SARS-Cov-2 infection and should not be used as the sole basis for treatment or other patient management decisions. A negative result may occur with  improper specimen collection/handling, submission of specimen other than nasopharyngeal swab, presence of viral mutation(s) within the areas targeted by this assay, and inadequate number of viral copies(<138 copies/mL). A negative result must be combined with clinical observations, patient history, and epidemiological information. The expected result is Negative.  Fact Sheet for Patients:  EntrepreneurPulse.com.au  Fact Sheet for Healthcare Providers:  IncredibleEmployment.be  This test is no t yet approved or cleared by the [place] FDA and  has been authorized for detection and/or diagnosis of SARS-CoV-2 by FDA under an Emergency Use Authorization (EUA). This EUA will remain  in effect (meaning this test can be used) for the duration of the COVID-19 declaration under Section 564(b)(1) of the Act, 21 U.S.C.section 360bbb-3(b)(1), unless the authorization is terminated  or revoked sooner.       Influenza A by PCR NEGATIVE NEGATIVE Final   Influenza B by PCR NEGATIVE NEGATIVE Final    Comment: (NOTE) The Xpert Xpress SARS-CoV-2/FLU/RSV plus assay is intended as an aid in the  diagnosis of influenza from Nasopharyngeal swab specimens and should not be used as a sole basis for treatment. Nasal washings and aspirates are unacceptable for Xpert Xpress SARS-CoV-2/FLU/RSV testing.  Fact Sheet for Patients: EntrepreneurPulse.com.au  Fact Sheet for Healthcare Providers: IncredibleEmployment.be  This test is  not yet approved or cleared by the [place] and has been authorized for detection and/or diagnosis of SARS-CoV-2 by FDA under an Emergency Use Authorization (EUA). This EUA will remain in effect (meaning this test can be used) for the duration of the COVID-19 declaration under Section 564(b)(1) of the Act, 21 U.S.C. section 360bbb-3(b)(1), unless the authorization is terminated or revoked.  Performed at [facility], [address] [place], [place] [address]   MRSA Next Gen by PCR, Nasal     Status: None   Collection Time: 06/15/21 11:43 PM   Specimen: Nasal Mucosa; Nasal Swab  Result Value Ref Range Status   MRSA by PCR Next Gen NOT DETECTED NOT DETECTED Final    Comment: (NOTE) The GeneXpert MRSA Assay (FDA approved for NASAL specimens only), is one component of a comprehensive MRSA colonization surveillance program. It is not intended to diagnose MRSA infection nor to guide or monitor treatment for MRSA infections. Test performance is not FDA approved in patients less than 46 years old. Performed at [facility], [place] [address] [place], [place] [address]          Radiology Studies: No results found.      Scheduled Meds:  budesonide (PULMICORT) nebulizer solution  0.25 mg Nebulization BID   chlorhexidine  15 mL Mouth Rinse BID   Chlorhexidine Gluconate Cloth  6 each Topical Daily   doxycycline  100 mg Oral Q12H   enoxaparin (LOVENOX) injection  40 mg Subcutaneous [id number]   folic acid  1 mg Oral Daily   levalbuterol  1.25 mg Nebulization BID   losartan   100 mg Oral Daily   mouth rinse  15 mL Mouth Rinse q12n4p   multivitamin with minerals  1 tablet Oral Daily   OLANZapine zydis  10 mg Oral QHS   phosphorus  500 mg Oral TID   predniSONE  30 mg Oral Q breakfast   propranolol  10 mg Oral TID   sodium chloride flush  10-40 mL Intracatheter Q12H   Testosterone  40.5 mg Topical Daily   thiamine  100 mg Oral Daily   Or   thiamine  100 mg Intravenous Daily   Continuous Infusions:  sodium chloride 10 mL/hr at 06/17/21 1811   cefTRIAXone (ROCEPHIN)  IV Stopped (06/19/21 1359)     LOS: 5 days    Time spent: 30 minutes   Barb Merino, MD [facility] Pager [phone]

## 2021-06-20 NOTE — Progress Notes (Signed)
NAME:  Antonio [name], MRN:  [id number], DOB:  02/03/1941, LOS: 5 ADMISSION DATE:  06/15/2021, CONSULTATION DATE: 06/16/2021 REFERRING MD:  Sloan Leiter - TR[facility] CHIEF COMPLAINT:  Shortness of breath  History of Present Illness:   80 year old man who presented to [facility] ED 8/30 with cough, dyspnea and fever x 5 days.  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 History lists COPD, however patient denies history of this; he was not a smoker and does not use inhalers at home.  In the ED, patient required O2 via [place]; CT Chest was negative for PE but demonstrated post-inflammatory fibrosis and consolidative process in the LLL. He was admitted and started on ceftriaxone and doxycycline.  PCCM consulted for management.  Pertinent  Medical History    has a past medical history of Age-related macular degeneration, dry, both eyes, B-cell lymphoma ([place]) (oncologist--- dr Irene Limbo), Carotid stenosis, asymptomatic, left (?), ED (erectile dysfunction), Elevated diaphragm, Erectile dysfunction, History of basal cell carcinoma (BCC) excision, History of kidney stones, History of seizure, History of squamous cell carcinoma excision, Hyperlipidemia, Hypertension, LBBB (left bundle branch block), Lymphoma of testis ([place]) (2000), Mass of left testis, MGUS (monoclonal gammopathy of unknown significance), 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  Significant Hospital Events: Including procedures, antibiotic start and stop dates in addition to other pertinent events    8/31 - Admit to hospitalists, on [place], PCCM consult 9/02 - Agitated overnight requiring increased Precedex, ongoing O2 requirement (5LNC at rest during the day, BiPAP at night). Ongoing productive cough despite multiple neb treatments/steroids. 9/3 congestion/cough better, agitated overnight  Interim History / Subjective:  Agitation improved. Did not require BiPAP. Weaning O2  Objective:  Blood pressure (!) 120/57, pulse 65, temperature 98.3 F (36.8 C), temperature source Axillary, resp. rate 18, height 6' (1.829 m), weight 86.2 kg, SpO2 95 %.        Intake/Output Summary (Last 24 hours) at 06/20/2021 0841 Last data filed at 06/20/2021 0341 Gross per 24 hour  Intake 733.48 ml  Output 2400 ml  Net -1666.52 ml    Filed Weights   06/15/21 1616  Weight: 86.2 kg   Physical Examination: General: Acutely ill-appearing elderly man in NAD. Sitting up in bed, alert. HEENT: [place]/AT, anicteric sclera, PERRL, moist mucous membranes. [place] in place. Neuro: Awake, oriented x 4. Responds to verbal stimuli. Following commands consistently. Moves all 4 extremities spontaneously. S CV: RRR, no m/g/r. PULM: Breathing even and unlabored on 2L [place],. Lung fields diminished. GI: Soft, nontender, nondistended. Normoactive bowel sounds. Extremities: Trace BLE edema noted. Skin: Warm/dry, no rashes.  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 --Continue community acquired pneumonia antibiotic coverage --s/p high dose IV steroids, Prednisone 30 mg x 3 days, 20 mg x 3 days, 10 mg x 3 days then off --Supplemental O2, wean for sats greater than 88% --Bronchodilators as needed  Toxic metabolic encephalopathy: Delirium --Zyprexa qhs PRN- do not discharge on this medication as only used for  acute delirium in inpatient setting --Avoid benzos   PCCM will sign off.  Please contact us if we can help in any way  Best Practice (right click and "Reselect all SmartList Selections" daily)   Per Primary Team  Critical care time: N/A   Lanier Clam, MD  Pulmonary & Critical Care 06/20/21 8:41 AM  Please see [email or web address] for pager details.  From 7A-7P if no response, please call [phone] After hours, please call ELink [phone]

## 2021-06-20 NOTE — NC FL2 (Signed)
[place] LEVEL OF CARE SCREENING TOOL     IDENTIFICATION  Patient Name: Antonio [name] Birthdate: 1941-05-16 Sex: male Admission Date (Current Location): 06/15/2021  [facility] and [place] Number:  Herbalist and Address:  [facility],  [place], [place]      Provider Number: [id number]  Attending Physician Name and Address:  Barb Merino, MD  Relative Name and Phone Number:  [name] (Daughter)   [phone] (Mobile    Current Level of Care: Hospital Recommended Level of Care: [place] Prior Approval Number:    Date Approved/Denied:   PASRR Number: [id number]  Discharge Plan: SNF    Current Diagnoses: Patient Active Problem List   Diagnosis Date Noted   Multifocal pneumonia 06/16/2021   Acute exacerbation of chronic obstructive pulmonary disease (COPD) ([place]) 06/16/2021   Sepsis ([place]) 06/16/2021   LBBB (left bundle branch block) 06/16/2021   Chronic respiratory failure with hypoxia ([place]) 12/16/2020   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 12/16/2020   Post-COVID chronic neurologic symptoms 12/16/2020   Lobar pneumonia ([place]) 10/27/2020   Acute hypoxemic respiratory failure due to COVID-19 ([place]) 10/26/2020   Mixed hyperlipidemia [id number]   Acute metabolic encephalopathy [id number]   Symptomatic anemia 10/20/2020   Acute respiratory failure with hypoxia ([place]) 10/20/2020   Pleural effusion 10/20/2020   Testosterone deficiency 09/09/2020   S/P orchiectomy 09/09/2020   Diffuse large B cell lymphoma ([place]) 03/17/2020   Testicular malignant neoplasm ([place]) 03/17/2020   Counseling regarding advance care planning and goals of care 03/17/2020   Hyperglycemia 09/05/2018   Mass of soft tissue of left lower extremity 09/05/2018   Monoclonal gammopathy of unknown significance (MGUS) 09/05/2018   Macrocytosis without anemia 09/05/2018   Macular degeneration of both eyes 06/21/2017   Slow  transit constipation 06/21/2017   Sleep-wake disorder 06/21/2017   Essential hypertension 06/21/2017   Pure hypercholesterolemia 06/21/2017   History of B-cell lymphoma 06/21/2017   Personal history of malignant melanoma 06/21/2017   Left carotid artery stenosis 06/21/2017   DDD (degenerative disc disease), cervical 06/21/2017   History of kidney stones 06/21/2017   Tubular adenoma of colon 06/21/2017   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   Vitamin D deficiency 06/21/2017    Orientation RESPIRATION BLADDER Height & Weight     Self, Time, Situation, Place  O2 (nasal Cannula 2 liters) Continent Weight: 190 lb (86.2 kg) Height:  6' (182.9 cm)  BEHAVIORAL SYMPTOMS/MOOD NEUROLOGICAL BOWEL NUTRITION STATUS      Continent Diet (regular)  AMBULATORY STATUS COMMUNICATION OF NEEDS Skin   Limited Assist Verbally Normal                       Personal Care Assistance Level of Assistance  Bathing, Feeding, Dressing Bathing Assistance: Limited assistance Feeding assistance: Independent Dressing Assistance: Limited assistance     Functional Limitations Info  Sight, Hearing, Speech Sight Info: Adequate Hearing Info: Adequate Speech Info: Adequate    SPECIAL CARE FACTORS FREQUENCY  PT (By licensed PT), OT (By licensed OT)     PT Frequency: 5 x a week OT Frequency: 5 x a week            Contractures Contractures Info: Not present    Additional Factors Info  Code Status, Allergies Code Status Info: full Allergies Info: Latex           Current Medications (06/20/2021):  This is the current hospital active medication list Current Facility-Administered  Medications  Medication Dose Route Frequency Provider Last Rate Last Admin   0.9 %  sodium chloride infusion   Intravenous PRN Barb Merino, MD 10 mL/hr at 06/17/21 1811 Infusion Verify at 06/17/21 1811   acetaminophen (TYLENOL) tablet 650 mg  650 mg Oral Q6H PRN Chotiner, Yevonne Aline, MD       Or   acetaminophen (TYLENOL)  suppository 650 mg  650 mg Rectal Q6H PRN Chotiner, Yevonne Aline, MD       albuterol (PROVENTIL) (2.5 MG/3ML) 0.083% nebulizer solution 2.5 mg  2.5 mg Nebulization Q4H PRN Tu, Ching T, DO   2.5 mg at 06/18/21 2306   budesonide (PULMICORT) nebulizer solution 0.25 mg  0.25 mg Nebulization BID Gleason, Otilio Carpen, PA-C   0.25 mg at 06/20/21 0738   cefTRIAXone (ROCEPHIN) 2 g in sodium chloride 0.9 % 100 mL IVPB  2 g Intravenous Q24H Barb Merino, MD   Stopped at 06/20/21 1412   chlorhexidine (PERIDEX) 0.12 % solution 15 mL  15 mL Mouth Rinse BID Tu, Ching T, DO   15 mL at 06/20/21 1030   Chlorhexidine Gluconate Cloth 2 % PADS 6 each  6 each Topical Daily Tu, Ching T, DO   6 each at 06/19/21 1325   doxycycline (VIBRA-TABS) tablet 100 mg  100 mg Oral Q12H Barb Merino, MD   100 mg at 06/20/21 0947   enoxaparin (LOVENOX) injection 40 mg  40 mg Subcutaneous Q24H Chotiner, Yevonne Aline, MD   40 mg at [id number]   folic acid (FOLVITE) tablet 1 mg  1 mg Oral Daily Barb Merino, MD   1 mg at 06/20/21 0948   guaiFENesin-dextromethorphan (ROBITUSSIN DM) 100-10 MG/5ML syrup 5 mL  5 mL Oral Q4H PRN Lestine Mount, PA-C   5 mL at 06/19/21 2127   haloperidol lactate (HALDOL) injection 2 mg  2 mg Intravenous Q6H PRN Hunsucker, Bonna Gains, MD       hydrALAZINE (APRESOLINE) injection 10 mg  10 mg Intravenous Q8H PRN [place] Crane M, PA-C   10 mg at 06/19/21 1818   levalbuterol (XOPENEX) nebulizer solution 1.25 mg  1.25 mg Nebulization BID Barb Merino, MD   1.25 mg at 06/20/21 0739   loratadine (CLARITIN) tablet 10 mg  10 mg Oral Daily Barb Merino, MD   10 mg at 06/20/21 1417   losartan (COZAAR) tablet 100 mg  100 mg Oral Daily Chotiner, Yevonne Aline, MD   100 mg at 06/20/21 0948   MEDLINE mouth rinse  15 mL Mouth Rinse q12n4p Tu, Ching T, DO   15 mL at 06/20/21 1230   menthol-cetylpyridinium (CEPACOL) lozenge 3 mg  1 lozenge Oral PRN Lestine Mount, PA-C   3 mg at 06/18/21 2209   multivitamin with minerals  tablet 1 tablet  1 tablet Oral Daily Barb Merino, MD   1 tablet at 06/20/21 0950   OLANZapine zydis (ZYPREXA) disintegrating tablet 10 mg  10 mg Oral QHS PRN Hunsucker, Bonna Gains, MD       phosphorus (K PHOS NEUTRAL) tablet 500 mg  500 mg Oral TID Barb Merino, MD   500 mg at 06/20/21 0949   predniSONE (DELTASONE) tablet 30 mg  30 mg Oral Q breakfast Hunsucker, Bonna Gains, MD   30 mg at 06/20/21 0948   propranolol (INDERAL) tablet 10 mg  10 mg Oral TID Chotiner, Yevonne Aline, MD   10 mg at 06/20/21 0949   sodium chloride flush (NS) 0.9 % injection 10-40 mL  10-40 mL  Intracatheter Q12H Barb Merino, MD   10 mL at 06/20/21 1030   sodium chloride flush (NS) 0.9 % injection 10-40 mL  10-40 mL Intracatheter PRN Barb Merino, MD       Testosterone 20.25 MG/ACT (1.62%) GEL 40.5 mg  40.5 mg Topical Daily Barb Merino, MD   40.5 mg at 06/20/21 0950   thiamine tablet 100 mg  100 mg Oral Daily Barb Merino, MD   100 mg at 06/20/21 [id number]   Or   thiamine (B-1) injection 100 mg  100 mg Intravenous Daily Barb Merino, MD   100 mg at 06/17/21 1016     Discharge Medications: Please see discharge summary for a list of discharge medications.  Relevant Imaging Results:  Relevant Lab Results:   Additional Information SSN: [id number]  [place], LCSW

## 2021-06-21 LAB — RESP PANEL BY RT-PCR (FLU A&B, COVID) ARPGX2
Influenza A by PCR: NEGATIVE
Influenza B by PCR: NEGATIVE
SARS Coronavirus 2 by RT PCR: NEGATIVE

## 2021-06-21 LAB — CBC WITH DIFFERENTIAL/PLATELET
Abs Immature Granulocytes: 1.65 10*3/uL — ABNORMAL HIGH (ref 0.00–0.07)
Basophils Absolute: 0 10*3/uL (ref 0.0–0.1)
Basophils Relative: 0 %
Eosinophils Absolute: 0 10*3/uL (ref 0.0–0.5)
Eosinophils Relative: 0 %
HCT: 45.6 % (ref 39.0–52.0)
Hemoglobin: 14.5 g/dL (ref 13.0–17.0)
Immature Granulocytes: 10 %
Lymphocytes Relative: 7 %
Lymphs Abs: 1.2 10*3/uL (ref 0.7–4.0)
MCH: 33.1 pg (ref 26.0–34.0)
MCHC: 31.8 g/dL (ref 30.0–36.0)
MCV: 104.1 fL — ABNORMAL HIGH (ref 80.0–100.0)
Monocytes Absolute: 1.4 10*3/uL — ABNORMAL HIGH (ref 0.1–1.0)
Monocytes Relative: 9 %
Neutro Abs: 12.3 10*3/uL — ABNORMAL HIGH (ref 1.7–7.7)
Neutrophils Relative %: 74 %
Platelets: 304 10*3/uL (ref 150–400)
RBC: 4.38 MIL/uL (ref 4.22–5.81)
RDW: 14.1 % (ref 11.5–15.5)
WBC: 16.7 10*3/uL — ABNORMAL HIGH (ref 4.0–10.5)
nRBC: 0 % (ref 0.0–0.2)

## 2021-06-21 LAB — BASIC METABOLIC PANEL
Anion gap: 7 (ref 5–15)
BUN: 16 mg/dL (ref 8–23)
CO2: 35 mmol/L — ABNORMAL HIGH (ref 22–32)
Calcium: 8.4 mg/dL — ABNORMAL LOW (ref 8.9–10.3)
Chloride: 100 mmol/L (ref 98–111)
Creatinine, Ser: 0.66 mg/dL (ref 0.61–1.24)
GFR, Estimated: >60 mL/min (ref >60)
Glucose, Bld: 101 mg/dL — ABNORMAL HIGH (ref 70–99)
Potassium: 4.2 mmol/L (ref 3.5–5.1)
Sodium: 142 mmol/L (ref 135–145)

## 2021-06-21 LAB — MAGNESIUM: Magnesium: 2 mg/dL (ref 1.7–2.4)

## 2021-06-21 LAB — PHOSPHORUS: Phosphorus: 3.4 mg/dL (ref 2.5–4.6)

## 2021-06-21 MED ORDER — HEPARIN SOD (PORK) LOCK FLUSH 100 UNIT/ML IV SOLN
500.0000 [IU] | INTRAVENOUS | Status: AC | PRN
Start: 2021-06-21 — End: 2021-06-21
  Administered 2021-06-21: 500 [IU]
  Filled 2021-06-21: qty 5

## 2021-06-21 MED ORDER — ALBUTEROL SULFATE (2.5 MG/3ML) 0.083% IN NEBU: 2.5000 mg | INHALATION_SOLUTION | RESPIRATORY_TRACT | 12 refills | Status: DC | PRN

## 2021-06-21 MED ORDER — GUAIFENESIN-DM 100-10 MG/5ML PO SYRP: 5.0000 mL | ORAL_SOLUTION | ORAL | 0 refills | Status: DC | PRN

## 2021-06-21 MED ORDER — PREDNISONE 10 MG PO TABS: ORAL_TABLET | ORAL | 0 refills | Status: DC

## 2021-06-21 NOTE — Progress Notes (Signed)
Called [facility] w/ report and spoke w/ Santiago Glad.

## 2021-06-21 NOTE — Discharge Summary (Signed)
Physician Discharge Summary  Antonio [name] [id number] DOB: 26-Nov-1940 DOA: 06/15/2021  PCP: [name], MD  Admit date: 06/15/2021 Discharge date: 06/21/2021  Admitted From: Home Disposition: Skilled nursing facility  Recommendations for Outpatient Follow-up:  Follow up with PCP in 1-2 weeks Please obtain BMP/CBC/magnesium/phosphorus in one week Schedule follow-up with pulmonary in 2 to 4 weeks.  Home Health: N/A Equipment/Devices: N/A  Discharge Condition: Stable CODE STATUS: Full code Diet recommendation: Low-salt diet  Discharge summary: 80 year old male with history of B-cell lymphoma, hypertension COPD and hyperlipidemia, COVID-19 infection with prolonged hospitalization in January 2022 presented with shortness of breath and cough worse for last 5 days.  Reported temperature 101 at home.  In the emergency room, tachycardic and tachypneic.  Blood pressure stable.  84% on room air.  Placed on BiPAP.  CTA showed no PE, lactic acid 4, CT scan with bilateral upper lobe fibrosis superimposed pneumonia. Patient admitted on BiPAP. Hospital course complicated with acute encephalopathy needing Precedex infusion and ultimately stabilized.  Currently adequately improved and on 2 L oxygen.    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   Much clinical improvement today.  Currently on 2 L oxygen.  Not used BiPAP for 48 hours.  All blood cultures positive.  Continue prednisone, tapering today. Complete antibiotic therapy with Rocephin and doxycycline today before discharge. Continue bronchodilator therapy, chest physiotherapy.  Mobilize. He will use 2 L of oxygen on mobility until clinical improvement.  Previously used oxygen.  May probably wean off next few days.   Sepsis present on admission:Sepsis present as the patient has 2 of the following  Lactic  acid 4 RR >20 And suspect his source of infections is pneumonia Sepsis physiology improved. Leucocytosis is due to prednisone therapy. Sepsis has resolved.    Essential hypertension: Blood pressure stable on losartan and Inderal.  Continue propanolol to avoid tachycardia.   COPD: As above.   Chronic left bundle branch block: Stable.   Electrolytes: Replaced and adequate.  Recheck in 1 week.   Testicular cancer status post bilateral orchiectomy: On high-dose testosterone at home.  Patient can use their own.   Acute metabolic encephalopathy /anxiety/agitation: Developed significant agitation and anxiety in ICU.  Treated symptomatically with Precedex, could not tolerate Ativan.  He ultimately stabilized and currently back to his normal mental status.  This was thought to be multifactorial delirium.  Patient is clinically improving.  He will complete antibiotic therapy today.  On 2 L oxygen.  He is a stable to transfer to a skilled level of care to continue inpatient therapies before returning to independent living.     Discharge Diagnoses:  Principal Problem:   Multifocal pneumonia Active Problems:   Essential hypertension   History of B-cell lymphoma   Acute respiratory failure with hypoxia (HCC)   Acute exacerbation of chronic obstructive pulmonary disease (COPD) (HCC)   Sepsis (HCC)   LBBB (left bundle branch block)    Discharge Instructions  Discharge Instructions     Call MD for:  difficulty breathing, headache or visual disturbances   Complete by: As directed    Call MD for:  temperature >100.4   Complete by: As directed    Diet - low sodium heart healthy   Complete by: As directed    Increase activity slowly   Complete by: As directed       Allergies as of 06/21/2021  Reactions   Latex Itching   Latex tape causes itching and redness.        Medication List     STOP taking these medications    lactulose 10 GM/15ML solution Commonly known as:  CHRONULAC   mupirocin 2% oint-hydrocortisone 2.5% cream-nystatin cream-zinc oxide 13% oint 1:1:1:5 mixture   mupirocin ointment 2 % Commonly known as: BACTROBAN   nystatin powder Commonly known as: nystatin   polyethylene glycol 17 g packet Commonly known as: MIRALAX / GLYCOLAX       TAKE these medications    albuterol 108 (90 Base) MCG/ACT inhaler Commonly known as: VENTOLIN HFA Inhale 2 puffs into the lungs every 6 (six) hours as needed for shortness of breath. What changed: Another medication with the same name was added. Make sure you understand how and when to take each.   albuterol (2.5 MG/3ML) 0.083% nebulizer solution Commonly known as: PROVENTIL Take 3 mLs (2.5 mg total) by nebulization every 4 (four) hours as needed for shortness of breath. What changed: You were already taking a medication with the same name, and this prescription was added. Make sure you understand how and when to take each.   carboxymethylcellulose 0.5 % Soln Commonly known as: REFRESH PLUS Place 1 drop into both eyes daily as needed.   Cholecalciferol 100 MCG (4000 UT) Caps Take 1 capsule (4,000 Units total) by mouth daily.   fluticasone 50 MCG/ACT nasal spray Commonly known as: FLONASE Place 2 sprays into both nostrils daily.   guaiFENesin-dextromethorphan 100-10 MG/5ML syrup Commonly known as: ROBITUSSIN DM Take 5 mLs by mouth every 4 (four) hours as needed for cough.   losartan 100 MG tablet Commonly known as: COZAAR TAKE 1 TABLET BY MOUTH EVERY DAY   meloxicam 7.5 MG tablet Commonly known as: MOBIC Take 1 tablet (7.5 mg total) by mouth daily.   predniSONE 10 MG tablet Commonly known as: DELTASONE 3 tabs for 2 days 2 tabs for 2 days 1 tab for 2 days   PRESERVISION AREDS 2 PO Take 1 capsule by mouth 2 (two) times daily.   propranolol 10 MG tablet Commonly known as: INDERAL Take 1 tablet (10 mg total) by mouth 3 (three) times daily.   simvastatin 40 MG tablet Commonly  known as: ZOCOR TAKE 1 TABLET BY MOUTH EVERY DAY   Testosterone 20.25 MG/ACT (1.62%) Gel Apply 40.5 mg topically daily.   TURMERIC COMPLEX/BLACK PEPPER PO Take 1,000 mg by mouth daily at 12 noon.   valACYclovir 1000 MG tablet Commonly known as: VALTREX Take 1000 (1 grams) PO 2 times daily for 3 days as needed for flares (herpes)   vitamin B-12 1000 MCG tablet Commonly known as: CYANOCOBALAMIN Take 1,000 mcg by mouth daily.        Follow-up Information     [name], MD Follow up in 2 week(s).   Specialty: Internal Medicine Contact information: [place] [address] [phone]                Allergies  Allergen Reactions   Latex Itching    Latex tape causes itching and redness.    Consultations: PCCM   Procedures/Studies: CT Angio Chest PE W and/or Wo Contrast  Result Date: 06/15/2021 CLINICAL DATA:  An 80 year old male with hypoxia and shortness of breath. Cough since Friday. EXAM: CT ANGIOGRAPHY CHEST WITH CONTRAST TECHNIQUE: Multidetector CT imaging of the chest was performed using the standard protocol during bolus administration of intravenous contrast. Multiplanar CT image reconstructions  and MIPs were obtained to evaluate the vascular anatomy. CONTRAST:  74m OMNIPAQUE IOHEXOL 350 MG/ML SOLN COMPARISON:  January 13, 2021. FINDINGS: Cardiovascular: RIGHT-sided Port-A-Cath terminating at the caval to atrial junction. Aorta of normal caliber with scattered atheromatous plaque. Mild cardiac enlargement without pericardial effusion of substantial volume. Enlargement of pulmonary vasculature up to 3.6 cm, potentially increased from the previous exam. No signs of acute pulmonary embolism. Limited assessment beyond the lobar level in most cases particularly in the lower lobes and mid chest, visualized segmental branches without definite signs of embolism. Mediastinum/Nodes: Esophagus grossly normal. Thoracic inlet structures are normal. No  axillary adenopathy. No mediastinal adenopathy.  No hilar adenopathy. Lungs/Pleura: Bandlike consolidative process, subpleural changes in the RIGHT lung with slight worsening since the previous exam. Bandlike subpleural changes in the LEFT peripheral chest in the upper lobe are stable. Worsening of septal thickening but now with frank consolidative process at the LEFT base in the LEFT lower lobe when compared to the prior study. Juxta diaphragmatic atelectasis with elevated RIGHT hemidiaphragm with similar appearance to prior imaging. No pneumothorax. No pleural effusion. Upper Abdomen: Hepatic steatosis. Elevated RIGHT hemidiaphragm. Liver is incompletely imaged. Imaged portions of pancreas, spleen, RIGHT and LEFT kidney without acute findings. Presumed cyst of the RIGHT kidney is unchanged. No acute gastrointestinal process on limited assessment. Musculoskeletal: Spinal degenerative changes. Signs of prior trauma to the RIGHT hemithorax. Healed rib fractures along the RIGHT chest as before. No acute musculoskeletal process. Review of the MIP images confirms the above findings. IMPRESSION: No signs of pulmonary embolism at the central or lobar level and in most visualized segmental branches but with limited assessment particularly in the mid and lower chest due to respiratory motion. 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 Given findings of post inflammatory fibrosis on the previous study would suggest follow-up to ensure there is no worsening of baseline changes. Persistent elevation of the RIGHT hemidiaphragm. Correlate with any history of diaphragmatic dysfunction Enlargement of pulmonary vasculature up to 3.6 cm, potentially increased from the previous exam. Correlate with any clinical signs of pulmonary arterial hypertension. Hepatic steatosis. Aortic Atherosclerosis (ICD10-I70.0). Electronically Signed   By: GZetta BillsM.D.    On: 06/15/2021 19:42   DG Chest Portable 1 View  Result Date: 06/15/2021 CLINICAL DATA:  Shortness of breath. EXAM: PORTABLE CHEST 1 VIEW COMPARISON:  November 02, 2020 FINDINGS: There is stable right-sided venous Port-A-Cath positioning. Moderate severity right basilar linear scarring and/or atelectasis is seen. There is no evidence of a pleural effusion or pneumothorax. Marked severity elevation of the right hemidiaphragm is seen and unchanged in severity. The heart size and mediastinal contours are within normal limits. Degenerative changes seen throughout the thoracic spine. IMPRESSION: Marked severity elevation of the right hemidiaphragm with moderate severity right basilar linear scarring and/or atelectasis. Electronically Signed   By: TVirgina NorfolkM.D.   On: 06/15/2021 18:22   (Echo, Carotid, EGD, Colonoscopy, ERCP)    Subjective: Patient seen and examined.  Denies any complaints.  Denies any chest pain or shortness of breath. He is looking forward to go to [facility] for rehab.   Discharge Exam: Vitals:   06/21/21 0422 06/21/21 0729  BP: 129/66   Pulse: 68   Resp: 20   Temp: 98.2 F (36.8 C)   SpO2: 97% 97%   Vitals:   06/20/21 2012 06/20/21 2209 06/21/21 0422 06/21/21 0729  BP: (!) 166/95  129/66   Pulse:  79  68   Resp: 20  20   Temp: 98.8 F (37.1 C)  98.2 F (36.8 C)   TempSrc: Oral  Oral   SpO2: 98% 98% 97% 97%  Weight:      Height:        General: Pt is alert, awake, not in acute distress Looks comfortable.  Eating breakfast.  He is on room air. Patient has a Port-A-Cath on the right chest wall. Cardiovascular: RRR, S1/S2 +, no rubs, no gallops Respiratory: CTA bilaterally, no wheezing, no rhonchi, occasional upper airway sounds.  No added sounds. Abdominal: Soft, NT, ND, bowel sounds + Extremities: no edema, no cyanosis    The results of significant diagnostics from this hospitalization (including imaging, microbiology, ancillary and laboratory) are  listed below for reference.     Microbiology: Recent Results (from the past 240 hour(s))  Resp Panel by RT-PCR (Flu A&B, Covid) Nasopharyngeal Swab     Status: None   Collection Time: 06/15/21  4:39 PM   Specimen: Nasopharyngeal Swab; Nasopharyngeal(NP) swabs in vial transport medium  Result Value Ref Range Status   SARS Coronavirus 2 by RT PCR NEGATIVE NEGATIVE Final    Comment: (NOTE) SARS-CoV-2 target nucleic acids are NOT DETECTED.  The SARS-CoV-2 RNA is generally detectable in upper respiratory specimens during the acute phase of infection. The lowest concentration of SARS-CoV-2 viral copies this assay can detect is 138 copies/mL. A negative result does not preclude SARS-Cov-2 infection and should not be used as the sole basis for treatment or other patient management decisions. A negative result may occur with  improper specimen collection/handling, submission of specimen other than nasopharyngeal swab, presence of viral mutation(s) within the areas targeted by this assay, and inadequate number of viral copies(<138 copies/mL). A negative result must be combined with clinical observations, patient history, and epidemiological information. The expected result is Negative.  Fact Sheet for Patients:  EntrepreneurPulse.com.au  Fact Sheet for Healthcare Providers:  IncredibleEmployment.be  This test is no t yet approved or cleared by the [place] FDA and  has been authorized for detection and/or diagnosis of SARS-CoV-2 by FDA under an Emergency Use Authorization (EUA). This EUA will remain  in effect (meaning this test can be used) for the duration of the COVID-19 declaration under Section 564(b)(1) of the Act, 21 U.S.C.section 360bbb-3(b)(1), unless the authorization is terminated  or revoked sooner.       Influenza A by PCR NEGATIVE NEGATIVE Final   Influenza B by PCR NEGATIVE NEGATIVE Final    Comment: (NOTE) The Xpert Xpress  SARS-CoV-2/FLU/RSV plus assay is intended as an aid in the diagnosis of influenza from Nasopharyngeal swab specimens and should not be used as a sole basis for treatment. Nasal washings and aspirates are unacceptable for Xpert Xpress SARS-CoV-2/FLU/RSV testing.  Fact Sheet for Patients: EntrepreneurPulse.com.au  Fact Sheet for Healthcare Providers: IncredibleEmployment.be  This test is not yet approved or cleared by the [place] FDA and has been authorized for detection and/or diagnosis of SARS-CoV-2 by FDA under an Emergency Use Authorization (EUA). This EUA will remain in effect (meaning this test can be used) for the duration of the COVID-19 declaration under Section 564(b)(1) of the Act, 21 U.S.C. section 360bbb-3(b)(1), unless the authorization is terminated or revoked.  Performed at [facility], [address], [place], [place] [address]   MRSA Next Gen by PCR, Nasal     Status: None   Collection Time: 06/15/21 11:43 PM   Specimen: Nasal Mucosa; Nasal Swab  Result Value Ref Range Status   MRSA by PCR Next Gen NOT DETECTED NOT DETECTED Final    Comment: (NOTE) The GeneXpert MRSA Assay (FDA approved for NASAL specimens only), is one component of a comprehensive MRSA colonization surveillance program. It is not intended to diagnose MRSA infection nor to guide or monitor treatment for MRSA infections. Test performance is not FDA approved in patients less than 45 years old. Performed at [facility], [place] [address] [place], [place] [address]      Labs: BNP (last 3 results) Recent Labs    06/15/21 1639  BNP 0000000   Basic Metabolic Panel: Recent Labs  Lab 06/17/21 0430 06/18/21 0430 06/19/21 0506 06/20/21 0304 06/21/21 0319  NA 143 138 140 140 142  K 4.4 4.3 3.6 3.7 4.2  CL 105 99 99 96* 100  CO2 31 34* 35* 34* 35*  GLUCOSE 141* 133* 105* 113* 101*  BUN 23 28* '21 17 16   '$ CREATININE 0.52* 0.73 0.62 0.64 0.66  CALCIUM 8.8* 8.2* 8.0* 8.2* 8.4*  MG 2.3 2.2 2.1  --  2.0  PHOS 1.7* 1.9* 3.0  --  3.4   Liver Function Tests: Recent Labs  Lab 06/15/21 1639 06/18/21 0430 06/19/21 0506 06/20/21 0304  AST 47* 36 21 21  ALT 71* 148* 100* 84*  ALKPHOS 60 86 82 84  BILITOT 0.9 0.5 0.6 0.7  PROT 7.0 5.2* 5.4* 6.0*  ALBUMIN 3.8 2.5* 2.7* 3.1*   No results for input(s): LIPASE, AMYLASE in the last 168 hours. No results for input(s): AMMONIA in the last 168 hours. CBC: Recent Labs  Lab 06/15/21 1639 06/15/21 1650 06/16/21 0623 06/17/21 0430 06/18/21 0430 06/21/21 0319  WBC 6.7  --  9.3 7.3 12.5* 16.7*  NEUTROABS 4.1  --   --  5.4 9.7* 12.3*  HGB 14.2 15.0 14.1 13.0 12.5* 14.5  HCT 42.9 44.0 44.1 41.1 39.9 45.6  MCV 100.9*  --  104.3* 107.0* 107.3* 104.1*  PLT 183  --  208 183 227 304   Cardiac Enzymes: No results for input(s): CKTOTAL, CKMB, CKMBINDEX, TROPONINI in the last 168 hours. BNP: Invalid input(s): POCBNP CBG: Recent Labs  Lab 06/16/21 1749  GLUCAP 172*   D-Dimer No results for input(s): DDIMER in the last 72 hours. Hgb A1c No results for input(s): HGBA1C in the last 72 hours. Lipid Profile No results for input(s): CHOL, HDL, LDLCALC, TRIG, CHOLHDL, LDLDIRECT in the last 72 hours. Thyroid function studies No results for input(s): TSH, T4TOTAL, T3FREE, THYROIDAB in the last 72 hours.  Invalid input(s): FREET3 Anemia work up No results for input(s): VITAMINB12, FOLATE, FERRITIN, TIBC, IRON, RETICCTPCT in the last 72 hours. Urinalysis    Component Value Date/Time   COLORURINE YELLOW 10/26/2020 1501   APPEARANCEUR CLEAR 10/26/2020 1501   LABSPEC 1.026 10/26/2020 1501   PHURINE 6.0 10/26/2020 1501   GLUCOSEU NEGATIVE 10/26/2020 1501   HGBUR NEGATIVE 10/26/2020 1501   BILIRUBINUR NEGATIVE 10/26/2020 1501   KETONESUR NEGATIVE 10/26/2020 1501   PROTEINUR NEGATIVE 10/26/2020 1501   NITRITE NEGATIVE 10/26/2020 1501   LEUKOCYTESUR  NEGATIVE 10/26/2020 1501   Sepsis Labs Invalid input(s): PROCALCITONIN,  WBC,  LACTICIDVEN Microbiology Recent Results (from the past 240 hour(s))  Resp Panel by RT-PCR (Flu A&B, Covid) Nasopharyngeal Swab     Status: None   Collection Time: 06/15/21  4:39 PM   Specimen: Nasopharyngeal Swab; Nasopharyngeal(NP) swabs in vial transport medium  Result Value Ref Range Status   SARS Coronavirus 2  by RT PCR NEGATIVE NEGATIVE Final    Comment: (NOTE) SARS-CoV-2 target nucleic acids are NOT DETECTED.  The SARS-CoV-2 RNA is generally detectable in upper respiratory specimens during the acute phase of infection. The lowest concentration of SARS-CoV-2 viral copies this assay can detect is 138 copies/mL. A negative result does not preclude SARS-Cov-2 infection and should not be used as the sole basis for treatment or other patient management decisions. A negative result may occur with  improper specimen collection/handling, submission of specimen other than nasopharyngeal swab, presence of viral mutation(s) within the areas targeted by this assay, and inadequate number of viral copies(<138 copies/mL). A negative result must be combined with clinical observations, patient history, and epidemiological information. The expected result is Negative.  Fact Sheet for Patients:  EntrepreneurPulse.com.au  Fact Sheet for Healthcare Providers:  IncredibleEmployment.be  This test is no t yet approved or cleared by the [place] FDA and  has been authorized for detection and/or diagnosis of SARS-CoV-2 by FDA under an Emergency Use Authorization (EUA). This EUA will remain  in effect (meaning this test can be used) for the duration of the COVID-19 declaration under Section 564(b)(1) of the Act, 21 U.S.C.section 360bbb-3(b)(1), unless the authorization is terminated  or revoked sooner.       Influenza A by PCR NEGATIVE NEGATIVE Final   Influenza B by PCR  NEGATIVE NEGATIVE Final    Comment: (NOTE) The Xpert Xpress SARS-CoV-2/FLU/RSV plus assay is intended as an aid in the diagnosis of influenza from Nasopharyngeal swab specimens and should not be used as a sole basis for treatment. Nasal washings and aspirates are unacceptable for Xpert Xpress SARS-CoV-2/FLU/RSV testing.  Fact Sheet for Patients: EntrepreneurPulse.com.au  Fact Sheet for Healthcare Providers: IncredibleEmployment.be  This test is not yet approved or cleared by the [place] FDA and has been authorized for detection and/or diagnosis of SARS-CoV-2 by FDA under an Emergency Use Authorization (EUA). This EUA will remain in effect (meaning this test can be used) for the duration of the COVID-19 declaration under Section 564(b)(1) of the Act, 21 U.S.C. section 360bbb-3(b)(1), unless the authorization is terminated or revoked.  Performed at [facility], [address] [place], [place] [address]   MRSA Next Gen by PCR, Nasal     Status: None   Collection Time: 06/15/21 11:43 PM   Specimen: Nasal Mucosa; Nasal Swab  Result Value Ref Range Status   MRSA by PCR Next Gen NOT DETECTED NOT DETECTED Final    Comment: (NOTE) The GeneXpert MRSA Assay (FDA approved for NASAL specimens only), is one component of a comprehensive MRSA colonization surveillance program. It is not intended to diagnose MRSA infection nor to guide or monitor treatment for MRSA infections. Test performance is not FDA approved in patients less than 44 years old. Performed at [facility], [place] [address] [place], [place] [address]      Time coordinating discharge:  35 minutes  SIGNED:   Barb Merino, MD  [facility] 06/21/2021, 8:07 AM

## 2021-06-21 NOTE — Progress Notes (Signed)
Went over discharge instructions w/ pt.  

## 2021-06-21 NOTE — TOC Transition Note (Signed)
Transition of Care [facility] - CM/SW Discharge Note   Patient Details  Name: Antonio [name] MRN: [id number] Date of Birth: 06-10-1941  [facility] CM/SW Contact:  Trish Mage, LCSW Phone Number: 06/21/2021, 10:47 AM   Clinical Narrative:   Patient who is from [facility] independent living is stable for discharge, will admit to short term rehab at same complex. Information FAXed per facility request. Family will provide transportation at [facility], and wife knows to bring O2. Nursing, please call report to Santiago Glad at [phone], room 158. TOC sign off.    Final next level of care: Skilled Nursing Facility Barriers to Discharge: Barriers Resolved   Patient Goals and CMS Choice        Discharge Placement                       Discharge Plan and Services   Discharge Planning Services: CM Consult                                 Social Determinants of Health (SDOH) Interventions     Readmission Risk Interventions No flowsheet data found.

## 2021-06-21 NOTE — Progress Notes (Signed)
Mobility Specialist - Progress Note    06/21/21 1131  Mobility  Activity Ambulated in hall  Level of Assistance Contact guard assist, steadying assist  Assistive Device Front wheel walker  Distance Ambulated (ft) 500 ft  Mobility Ambulated with assistance in hallway  Mobility Response Tolerated well  Mobility performed by Mobility specialist  $Mobility charge 1 Mobility    Pre-mobility: 86 HR 91% SpO2 During mobility: 97 HR, 81%-85% SpO2 Post-mobility: 89% SPO2  Upon entry pt was eager to walk, but insisted on not using O2 and stating "I don't need it". Request to disconnect O2 was denied. Pt used RW to ambulate 500 ft in hallway while on 2L of O2 and reported not feeling SOB, but was encouraged to take multiple deep breaths and 2 standing rest breaks due to O2 levels dropping as recorded above. Pt seems unaware of O2 needs and insisted on doing fine during entire session. Pt was returned to bed after ambulating and left with call bell at side and was reconnected to 2L of O2. RN informed of session.   [place] Specialist Acute Rehabilitation Services Phone: [phone] 06/21/21, 11:36 AM

## 2021-06-22 ENCOUNTER — Non-Acute Institutional Stay (SKILLED_NURSING_FACILITY): Payer: PPO | Admitting: Orthopedic Surgery

## 2021-06-22 ENCOUNTER — Encounter: Payer: Self-pay | Admitting: Orthopedic Surgery

## 2021-06-22 DIAGNOSIS — J189 Pneumonia, unspecified organism: Secondary | ICD-10-CM | POA: Diagnosis not present

## 2021-06-22 DIAGNOSIS — R0609 Other forms of dyspnea: Secondary | ICD-10-CM | POA: Diagnosis not present

## 2021-06-22 DIAGNOSIS — E349 Endocrine disorder, unspecified: Secondary | ICD-10-CM | POA: Diagnosis not present

## 2021-06-22 DIAGNOSIS — G250 Essential tremor: Secondary | ICD-10-CM

## 2021-06-22 DIAGNOSIS — U099 Post covid-19 condition, unspecified: Secondary | ICD-10-CM

## 2021-06-22 DIAGNOSIS — I471 Supraventricular tachycardia: Secondary | ICD-10-CM | POA: Diagnosis not present

## 2021-06-22 DIAGNOSIS — K5901 Slow transit constipation: Secondary | ICD-10-CM

## 2021-06-22 DIAGNOSIS — I10 Essential (primary) hypertension: Secondary | ICD-10-CM | POA: Diagnosis not present

## 2021-06-22 NOTE — Progress Notes (Signed)
Location:   [facility] Room Number: 158 Place of Service:    Provider:  Windell Moulding, NP    Patient Care Team: [name], MD as PCP - General (Internal Medicine)  Extended Emergency Contact Information Primary Emergency Contact: [place]ock [place] Mobile Phone: [phone] Relation: Daughter Secondary Emergency Contact: Molstad,[name] Address: [address] [address] [name] Phone: [phone] Mobile Phone: [phone] Relation: Spouse  Code Status:  DNR Goals of care: Advanced Directive information Advanced Directives 06/16/2021  Does Patient Have a Medical Advance Directive? Yes  Type of Advance Directive [place]  Does patient want to make changes to medical advance directive? No - Patient declined  Copy of [place] in Chart? No - copy requested  Pre-existing out of facility DNR order (yellow form or pink MOST form) -     Chief Complaint  Patient presents with   Hospitalization Follow-up    Patient following up on hospital discharge    HPI:  Pt is a 80 y.o. male seen today for a hospital f/u s/p admission from [facility] 08/30- 09/05.   He currently resides on the rehabilitation unit at [facility]. Past medical history includes: B-cell lymphoma, HTN, HLD, constipation, DDD, testicular neoplasm, and macular degeneration.   08/30 he presented to the ED with worsening sob and fever of 101. January 2022, he was hospitalized for 16 days with covid 19. CXR revealed marked elevation of right hemidiaphragm with moderate severity of right basilar linear scarring and atelectasis. CT chest negative for PE, revealed pneumonia post inflammatory fibrosis of left and right upper lobes. Diagnosed with multifocal pneumonia and sepsis. He tested negative for influenza and covid 19. Blood cultures positive. He was given Rocephin, doxycycline and prednisone taper. Weaned  to 2 liters oxygen.   HTN- stable with losartan and inderal.   LBBB- stable during hospitalization.   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   Acute metabolic encephalopathy- developed agitation and anxiety while in ICU. He was given Precedex due to Ativan intolerance.   Today, he remains on 2 liters of oxygen. He reports feeling better. Upset productive cough is still lingering. Requesting mucinex to help thin secretions. Also requesting propranolol be changed to prn. He only uses medication for tremors. LBM 09/05. He plans to return home sometime next week.   No recent falls, injuries or behavioral outbursts. Ambulating on his own.   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   ED (erectile dysfunction)    Elevated diaphragm    right   Erectile dysfunction    History of basal cell carcinoma (BCC) excision    multiple excision in office   History of kidney stones    History of seizure    03-03-2020 per pt x1 while taking interferan for melanoma in 2001 ,  none since, told a side effect of interferan   History of squamous cell carcinoma excision    multiple skin excision in office   Hyperlipidemia    Hypertension    followed by pcp  (03-03-2020 per pt had a stress test approx. 2010,  told normal)   LBBB (left bundle branch block)    Lymphoma of testis ([place]) 2000   s/p right orchiectomy    Mass of left testis    MGUS (monoclonal gammopathy of unknown significance)    oncologist--- dr Irene Limbo   Mixed hyperlipidemia 10/26/2020   Personal history of malignant melanoma of skin dermatologist--- dr Wilhemina Bonito   dx 2001 left calf area  s/p WLE  ,  completed one year interferan (03-03-2020 pt states did not involve lymph nodes, no recurrence)   Urgency of urination    Wears hearing aid in both ears    Past Surgical History:  Procedure Laterality Date   CATARACT EXTRACTION W/ INTRAOCULAR LENS IMPLANT Bilateral 2018   IR IMAGING GUIDED PORT INSERTION  03/31/2020   MELANOMA EXCISION  10/1999   ORCHIECTOMY Right 2000   ORCHIECTOMY Left 03/05/2020   Procedure: ORCHIECTOMY;  Surgeon: [name], MD;  Location: [facility];  Service: Urology;  Laterality: Left;   TONSILLECTOMY  child   TOTAL KNEE ARTHROPLASTY Right 06/2009    Allergies  Allergen Reactions   Latex Itching    Latex tape causes itching and redness.    Allergies as of 06/22/2021       Reactions   Latex Itching   Latex tape causes itching and redness.        Medication List        Accurate as of June 22, 2021  3:17 PM. If you have any questions, ask your nurse or doctor.          albuterol 108 (90 Base) MCG/ACT inhaler Commonly known as: VENTOLIN HFA Inhale 2 puffs into the lungs every 6 (six) hours as needed for shortness of breath.   albuterol (2.5 MG/3ML) 0.083% nebulizer solution Commonly known as: PROVENTIL Take 3 mLs (2.5 mg total) by nebulization every 4 (four) hours as needed for shortness of breath.   carboxymethylcellulose 0.5 % Soln Commonly known as: REFRESH PLUS Place 1 drop into both eyes daily as needed.   Cholecalciferol 50 MCG (2000 UT) Caps Take 1 capsule by mouth 2 (two) times daily. What changed: Another medication with the same name was removed. Continue taking this medication, and follow the directions you see here. Changed by: Yvonna Alanis, NP   fluticasone 50 MCG/ACT nasal spray Commonly known as: FLONASE Place 2 sprays into both nostrils daily.   guaiFENesin-dextromethorphan 100-10 MG/5ML syrup Commonly known as: ROBITUSSIN DM Take 5 mLs by mouth every 4 (four) hours as needed for cough.   losartan  100 MG tablet Commonly known as: COZAAR TAKE 1 TABLET BY MOUTH EVERY DAY   meloxicam 7.5 MG tablet Commonly known as: MOBIC Take 7.5 mg by mouth daily. What changed: Another medication with the same name was removed. Continue taking this medication, and follow the directions you see here. Changed by: Yvonna Alanis, NP   predniSONE 10 MG tablet Commonly known as: DELTASONE Take 20 mg by mouth daily.   predniSONE 10 MG tablet Commonly known as: DELTASONE 3 tabs for 2 days 2 tabs for 2 days 1 tab for 2 days   PRESERVISION AREDS 2 PO Take 1 capsule by mouth 2 (two) times daily.   propranolol 10 MG tablet Commonly known as: INDERAL Take 10 mg by mouth 3 (three) times daily. What changed: Another medication with the same name was removed. Continue taking this medication, and follow the directions you see here. Changed by: Yvonna Alanis, NP   simvastatin  40 MG tablet Commonly known as: ZOCOR TAKE 1 TABLET BY MOUTH EVERY DAY   Testosterone 20.25 MG/ACT (1.62%) Gel Apply 40.5 mg topically daily.   TURMERIC COMPLEX/BLACK PEPPER PO Take 1,000 mg by mouth daily at 12 noon.   valACYclovir 1000 MG tablet Commonly known as: VALTREX Take 1000 (1 grams) PO 2 times daily for 3 days as needed for flares (herpes)   vitamin B-12 1000 MCG tablet Commonly known as: CYANOCOBALAMIN Take 1,000 mcg by mouth daily.        Review of Systems  Constitutional:  Negative for activity change, appetite change, fatigue and fever.  HENT:  Positive for congestion. Negative for sore throat.   Eyes:  Negative for photophobia and visual disturbance.  Respiratory:  Positive for cough. Negative for shortness of breath and wheezing.        Oxygen use  Cardiovascular:  Negative for chest pain and leg swelling.  Gastrointestinal:  Positive for constipation. Negative for abdominal distention, abdominal pain, diarrhea and nausea.  Genitourinary:  Negative for dysuria, frequency and hematuria.  Musculoskeletal:   Negative for arthralgias and myalgias.  Skin: Negative.   Neurological:  Positive for weakness. Negative for dizziness and headaches.  Psychiatric/Behavioral:  Negative for agitation, confusion and dysphoric mood. The patient is not nervous/anxious.    Immunization History  Administered Date(s) Administered   Influenza, High Dose Seasonal PF 07/15/2017, 07/16/2018, 06/11/2019, 07/13/2020   Moderna SARS-COV2 Booster Vaccination 01/25/2021   Moderna Sars-Covid-2 Vaccination 10/29/2019, 11/26/2019, 06/11/2020   Pneumococcal Conjugate-13 03/04/2014   Pneumococcal Polysaccharide-23 07/27/2010   Tdap 03/04/2014   Zoster Recombinat (Shingrix) 10/06/2017, 12/09/2017   Zoster, Live 09/22/2006   Pertinent  Health Maintenance Due  Topic Date Due   INFLUENZA VACCINE  05/17/2021   PNA vac Low Risk Adult  Completed   Fall Risk  04/29/2021 03/10/2021 12/02/2020 10/19/2020 09/09/2020  Falls in the past year? 0 0 0 0 0  Number falls in past yr: 0 0 0 0 0  Injury with Fall? - - 0 0 0  Comment - - - - -  Follow up Falls evaluation completed - - - -   Functional Status Survey:    Vitals:   06/22/21 1452  BP: 124/73  Pulse: 94  Resp: 18  Temp: 98.2 F (36.8 C)  SpO2: 91%  Weight: 182 lb 6.4 oz (82.7 kg)  Height: 6' (1.829 m)   Body mass index is 24.74 kg/m. Physical Exam Vitals reviewed.  Constitutional:      General: He is not in acute distress. HENT:     Head: Normocephalic.     Right Ear: There is no impacted cerumen.     Left Ear: There is no impacted cerumen.     Nose: Nose normal.     Mouth/Throat:     Mouth: Mucous membranes are moist.  Eyes:     General:        Right eye: No discharge.        Left eye: No discharge.  Neck:     Vascular: No carotid bruit.  Cardiovascular:     Rate and Rhythm: Normal rate and regular rhythm.     Pulses: Normal pulses.     Heart sounds: Normal heart sounds. No murmur heard. Pulmonary:     Effort: Pulmonary effort is normal. No  respiratory distress.     Breath sounds: Examination of the right-upper field reveals rhonchi. Examination of the left-upper field reveals rhonchi. Rhonchi present. No wheezing.  Comments: 2 liters oxygen Abdominal:     General: Bowel sounds are normal. There is no distension.     Palpations: Abdomen is soft.     Tenderness: There is no abdominal tenderness.  Musculoskeletal:     Cervical back: Normal range of motion.     Right lower leg: No edema.     Left lower leg: No edema.  Lymphadenopathy:     Cervical: No cervical adenopathy.  Skin:    General: Skin is warm and dry.     Capillary Refill: Capillary refill takes less than 2 seconds.  Neurological:     General: No focal deficit present.     Mental Status: He is alert and oriented to person, place, and time.  Psychiatric:        Mood and Affect: Mood normal.        Behavior: Behavior normal.    Labs reviewed: Recent Labs    06/18/21 0430 06/19/21 0506 06/20/21 0304 06/21/21 0319  NA 138 140 140 142  K 4.3 3.6 3.7 4.2  CL 99 99 96* 100  CO2 34* 35* 34* 35*  GLUCOSE 133* 105* 113* 101*  BUN 28* '21 17 16  '$ CREATININE 0.73 0.62 0.64 0.66  CALCIUM 8.2* 8.0* 8.2* 8.4*  MG 2.2 2.1  --  2.0  PHOS 1.9* 3.0  --  3.4   Recent Labs    06/18/21 0430 06/19/21 0506 06/20/21 0304  AST 36 21 21  ALT 148* 100* 84*  ALKPHOS 86 82 84  BILITOT 0.5 0.6 0.7  PROT 5.2* 5.4* 6.0*  ALBUMIN 2.5* 2.7* 3.1*   Recent Labs    06/17/21 0430 06/18/21 0430 06/21/21 0319  WBC 7.3 12.5* 16.7*  NEUTROABS 5.4 9.7* 12.3*  HGB 13.0 12.5* 14.5  HCT 41.1 39.9 45.6  MCV 107.0* 107.3* 104.1*  PLT 183 227 304   Lab Results  Component Value Date   TSH 1.569 11/12/2018   Lab Results  Component Value Date   HGBA1C 5.7 02/27/2020   Lab Results  Component Value Date   CHOL 132 02/27/2020   HDL 65 02/27/2020   LDLCALC 56 02/27/2020   TRIG 108 10/20/2020    Significant Diagnostic Results in last 30 days:  CT Angio Chest PE W  and/or Wo Contrast  Result Date: 06/15/2021 CLINICAL DATA:  An 80 year old male with hypoxia and shortness of breath. Cough since Friday. EXAM: CT ANGIOGRAPHY CHEST WITH CONTRAST TECHNIQUE: Multidetector CT imaging of the chest was performed using the standard protocol during bolus administration of intravenous contrast. Multiplanar CT image reconstructions and MIPs were obtained to evaluate the vascular anatomy. CONTRAST:  49m OMNIPAQUE IOHEXOL 350 MG/ML SOLN COMPARISON:  January 13, 2021. FINDINGS: Cardiovascular: RIGHT-sided Port-A-Cath terminating at the caval to atrial junction. Aorta of normal caliber with scattered atheromatous plaque. Mild cardiac enlargement without pericardial effusion of substantial volume. Enlargement of pulmonary vasculature up to 3.6 cm, potentially increased from the previous exam. No signs of acute pulmonary embolism. Limited assessment beyond the lobar level in most cases particularly in the lower lobes and mid chest, visualized segmental branches without definite signs of embolism. Mediastinum/Nodes: Esophagus grossly normal. Thoracic inlet structures are normal. No axillary adenopathy. No mediastinal adenopathy.  No hilar adenopathy. Lungs/Pleura: Bandlike consolidative process, subpleural changes in the RIGHT lung with slight worsening since the previous exam. Bandlike subpleural changes in the LEFT peripheral chest in the upper lobe are stable. Worsening of septal thickening but now with frank consolidative process at the LEFT base  in the LEFT lower lobe when compared to the prior study. Juxta diaphragmatic atelectasis with elevated RIGHT hemidiaphragm with similar appearance to prior imaging. No pneumothorax. No pleural effusion. Upper Abdomen: Hepatic steatosis. Elevated RIGHT hemidiaphragm. Liver is incompletely imaged. Imaged portions of pancreas, spleen, RIGHT and LEFT kidney without acute findings. Presumed cyst of the RIGHT kidney is unchanged. No acute gastrointestinal  process on limited assessment. Musculoskeletal: Spinal degenerative changes. Signs of prior trauma to the RIGHT hemithorax. Healed rib fractures along the RIGHT chest as before. No acute musculoskeletal process. Review of the MIP images confirms the above findings. IMPRESSION: No signs of pulmonary embolism at the central or lobar level and in most visualized segmental branches but with limited assessment particularly in the mid and lower chest due to respiratory motion. 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 Given findings of post inflammatory fibrosis on the previous study would suggest follow-up to ensure there is no worsening of baseline changes. Persistent elevation of the RIGHT hemidiaphragm. Correlate with any history of diaphragmatic dysfunction Enlargement of pulmonary vasculature up to 3.6 cm, potentially increased from the previous exam. Correlate with any clinical signs of pulmonary arterial hypertension. Hepatic steatosis. Aortic Atherosclerosis (ICD10-I70.0). Electronically Signed   By: Zetta Bills M.D.   On: 06/15/2021 19:42   DG Chest Portable 1 View  Result Date: 06/15/2021 CLINICAL DATA:  Shortness of breath. EXAM: PORTABLE CHEST 1 VIEW COMPARISON:  November 02, 2020 FINDINGS: There is stable right-sided venous Port-A-Cath positioning. Moderate severity right basilar linear scarring and/or atelectasis is seen. There is no evidence of a pleural effusion or pneumothorax. Marked severity elevation of the right hemidiaphragm is seen and unchanged in severity. The heart size and mediastinal contours are within normal limits. Degenerative changes seen throughout the thoracic spine. IMPRESSION: Marked severity elevation of the right hemidiaphragm with moderate severity right basilar linear scarring and/or atelectasis. Electronically Signed   By: Virgina Norfolk M.D.   On: 06/15/2021 18:22    Assessment/Plan 1.  Multifocal pneumonia - CT chest revealed pneumonia post inflammatory fibrosis of left and right upper lobes - given rocephin and doxycycline - rhonchi upper lobes - guaifenesin 600 mg po bid x 7 days - cont albuterol nebs - cont prednisone taper - cont incentive spirometer - cont to wean O2  2. Primary hypertension - controlled - cont losartan and inderal  3. SVT (supraventricular tachycardia) (HCC) - cont propranolol 10 mg prn  4. Benign essential tremor - see above  5. 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 Testosterone deficiency - s/p bilateral orchiectomy  - cont testosterone  7. Slow transit constipation - LBM 09/05 - encourage fluids    Family/ staff Communication: plan discussed with patient and nurse  Labs/tests ordered:  cbc/diff, bmp, magnesium, phosphorus in 1 week

## 2021-06-24 ENCOUNTER — Encounter: Payer: Self-pay | Admitting: Internal Medicine

## 2021-06-24 ENCOUNTER — Non-Acute Institutional Stay (SKILLED_NURSING_FACILITY): Payer: PPO | Admitting: Internal Medicine

## 2021-06-24 ENCOUNTER — Telehealth: Payer: Self-pay | Admitting: Pulmonary Disease

## 2021-06-24 DIAGNOSIS — M542 Cervicalgia: Secondary | ICD-10-CM | POA: Diagnosis not present

## 2021-06-24 DIAGNOSIS — U099 Post covid-19 condition, unspecified: Secondary | ICD-10-CM | POA: Diagnosis not present

## 2021-06-24 DIAGNOSIS — J189 Pneumonia, unspecified organism: Secondary | ICD-10-CM | POA: Diagnosis not present

## 2021-06-24 DIAGNOSIS — E349 Endocrine disorder, unspecified: Secondary | ICD-10-CM | POA: Diagnosis not present

## 2021-06-24 DIAGNOSIS — I10 Essential (primary) hypertension: Secondary | ICD-10-CM | POA: Diagnosis not present

## 2021-06-24 DIAGNOSIS — R0609 Other forms of dyspnea: Secondary | ICD-10-CM | POA: Diagnosis not present

## 2021-06-24 DIAGNOSIS — G250 Essential tremor: Secondary | ICD-10-CM | POA: Diagnosis not present

## 2021-06-24 NOTE — Progress Notes (Signed)
Provider:  Veleta Miners MD Location:   [place] Room Number: 158 Place of Service:  SNF [phone])  PCP: Antonio Dad, MD Patient Care Team: Antonio Dad, MD as PCP - General (Internal Medicine)  Extended Emergency Contact Information Primary Emergency Contact: Antonio [name] Mobile Phone: [phone] Relation: Daughter Secondary Emergency Contact: [name],Antonio Address: [place] [address] Antonio [name] of [place] Phone: [phone] Mobile Phone: [phone] Relation: Spouse  Code Status: Full Code Goals of Care: Advanced Directive information Advanced Directives 06/24/2021  Does Patient Have a Medical Advance Directive? Yes  Type of Paramedic of [place];Out of facility DNR (pink MOST or yellow form)  Does patient want to make changes to medical advance directive? No - Patient declined  Copy of [place] in Chart? Yes - validated most recent copy scanned in chart (See row information)  Pre-existing out of facility DNR order (yellow form or pink MOST form) Yellow form placed in chart (order not valid for inpatient use);Pink MOST form placed in chart (order not valid for inpatient use)      Chief Complaint  Patient presents with   New Admit To SNF    Admission to SNF    HPI: Patient is a 80 y.o. male seen today for admission to SNF for Therapy   Was admitted in The hospital from 8/30 -9/5 for Multifocal Pneumonia   Patient has a history of COVID-pneumonia in January 2022. 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  Per oncology he is in remission  Benign tremors in right hand unable to sign Hypertension Arthritis  takes meloxicam   He came to ED for SOB and Fever CT Scan showed Multifocal pneumonia All Blood cultures were Negative Treated  with Rocephin  and Prednisone Taper He did developed Acute metabolic Encephalopathy in the hospital  Treated with Precedex.  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   ED (erectile dysfunction)    Elevated diaphragm    right   Erectile dysfunction    History of basal cell carcinoma (BCC) excision    multiple excision in office   History of kidney stones    History of seizure    03-03-2020 per pt x1 while taking interferan for melanoma in 2001 ,  none since, told a side effect of interferan   History of squamous cell carcinoma excision    multiple skin excision in office   Hyperlipidemia    Hypertension    followed by pcp  (03-03-2020 per pt had a stress test approx. 2010, told normal)   LBBB (left bundle branch block)    Lymphoma of testis [facility]) 2000   s/p right orchiectomy    Mass of left testis    MGUS (monoclonal gammopathy of unknown significance)    oncologist--- dr Antonio [name]   Mixed hyperlipidemia 10/26/2020   Personal  history of malignant melanoma of skin dermatologist--- dr Wilhemina Bonito   dx 2001 left calf area  s/p WLE ,  completed one year interferan (03-03-2020 pt states did not involve lymph nodes, no recurrence)   Urgency of urination    Wears hearing aid in both ears    Past Surgical History:  Procedure Laterality Date   CATARACT EXTRACTION W/ INTRAOCULAR LENS IMPLANT Bilateral 2018   IR IMAGING GUIDED PORT INSERTION  03/31/2020   MELANOMA EXCISION  10/1999   ORCHIECTOMY Right 2000   ORCHIECTOMY Left 03/05/2020    Procedure: ORCHIECTOMY;  Surgeon: Antonio Seal, MD;  Location: [facility];  Service: Urology;  Laterality: Left;   TONSILLECTOMY  child   TOTAL KNEE ARTHROPLASTY Right 06/2009    reports that he has never smoked. He has never used smokeless tobacco. He reports current alcohol use of about 14.0 standard drinks per week. He reports that he does not use drugs. Social History   Socioeconomic History   Marital status: Married    Spouse name: Not on file   Number of children: Not on file   Years of education: Not on file   Highest education level: Not on file  Occupational History   Not on file  Tobacco Use   Smoking status: Never   Smokeless tobacco: Never  Vaping Use   Vaping Use: Never used  Substance and Sexual Activity   Alcohol use: Yes    Alcohol/week: 14.0 standard drinks    Types: 14 Glasses of wine per week    Comment: 2 wine daily   Drug use: Never   Sexual activity: Not on file  Other Topics Concern   Not on file  Social History Narrative   Social History      Diet?good      Do you drink/eat things with caffeine? seldom      Marital status?          yes                          What year were you married? 1962      Do you live in a house, apartment, assisted living, condo, trailer, etc.? house      Is it one or more stories?1      How many persons live in your home? 2      Do you have any pets in your home? (please list) no      Highest level of education completed? college      Current or past profession: president and [employer]      Do you exercise?   yes                                   Type & how often? 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 yes      Do you have a DNR form?       yes                            If not, do you want to discuss one?      Do you have signed POA/HPOA for forms?  yes      Functional Status  Do you have difficulty bathing or dressing yourself? no       Do you have difficulty preparing food or eating? no      Do you have difficulty managing your medications? no      Do you have difficulty managing your finances? no      Do you have difficulty affording your medications? no   Social Determinants of Health   Financial Resource Strain: Not on file  Food Insecurity: Not on file  Transportation Needs: Not on file  Physical Activity: Not on file  Stress: Not on file  Social Connections: Not on file  Intimate Partner Violence: Not on file    Functional Status Survey:    Family History  Problem Relation Age of Onset   Cataracts Mother    Transient ischemic attack Mother    Asthma Mother    Cataracts Father    Diabetes Father        borderline   Hypertension Father    Hyperlipidemia Father    Hyperlipidemia Son    Hypertension Son    Amblyopia Neg Hx    Blindness Neg Hx    Glaucoma Neg Hx    Macular degeneration Neg Hx    Retinal detachment Neg Hx    Strabismus Neg Hx    Retinitis pigmentosa Neg Hx     Health Maintenance  Topic Date Due   INFLUENZA VACCINE  05/17/2021   COVID-19 Vaccine (5 - Booster for Moderna series) 05/27/2021   TETANUS/TDAP  03/04/2024   PNA vac Low Risk Adult  Completed   Zoster Vaccines- Shingrix  Completed   HPV VACCINES  Aged Out    Allergies  Allergen Reactions   Latex Itching    Latex tape causes itching and redness.    Allergies as of 06/24/2021       Reactions   Latex Itching   Latex tape causes itching and redness.        Medication List        Accurate as of June 24, 2021  9:57 AM. If you have any questions, ask your nurse or doctor.          albuterol 108 (90 Base) MCG/ACT inhaler Commonly known as: VENTOLIN HFA Inhale 2 puffs into the lungs every 6 (six) hours as needed for shortness of breath.   albuterol (2.5 MG/3ML) 0.083% nebulizer solution Commonly known as: PROVENTIL Take 3 mLs (2.5 mg total) by nebulization every 4 (four) hours as needed for  shortness of breath.   carboxymethylcellulose 0.5 % Soln Commonly known as: REFRESH PLUS Place 1 drop into both eyes daily as needed.   Cepacol Sore Throat 15-2.6 MG Lozg Generic drug: Benzocaine-Menthol Use as directed in the mouth or throat every 2 (two) hours as needed.   Cholecalciferol 50 MCG (2000 UT) Caps Take 1 capsule by mouth 2 (two) times daily.   fluticasone 50 MCG/ACT nasal spray Commonly known as: FLONASE Place 2 sprays into both nostrils daily.   guaiFENesin-dextromethorphan 100-10 MG/5ML syrup Commonly known as: ROBITUSSIN DM Take 5 mLs by mouth every 4 (four) hours as needed for cough.   losartan 100 MG tablet Commonly known as: COZAAR TAKE 1 TABLET BY MOUTH EVERY DAY   meloxicam 7.5 MG tablet Commonly known as: MOBIC Take 7.5 mg by mouth daily.   predniSONE 10 MG tablet Commonly known as: DELTASONE Take 20 mg by mouth daily.   predniSONE 10 MG tablet Commonly known as: DELTASONE 3 tabs for 2 days 2  tabs for 2 days 1 tab for 2 days   PRESERVISION AREDS 2 PO Take 1 capsule by mouth 2 (two) times daily.   propranolol 10 MG tablet Commonly known as: INDERAL Take 10 mg by mouth 3 (three) times daily.   simvastatin 40 MG tablet Commonly known as: ZOCOR TAKE 1 TABLET BY MOUTH EVERY DAY   Testosterone 20.25 MG/ACT (1.62%) Gel Apply 40.5 mg topically daily.   TURMERIC COMPLEX/BLACK PEPPER PO Take 1,000 mg by mouth daily at 12 noon.   valACYclovir 1000 MG tablet Commonly known as: VALTREX Take 1000 (1 grams) PO 2 times daily for 3 days as needed for flares (herpes)   vitamin B-12 1000 MCG tablet Commonly known as: CYANOCOBALAMIN Take 1,000 mcg by mouth daily.        Review of Systems  Constitutional:  Positive for activity change.  HENT: Negative.    Respiratory:  Positive for cough and shortness of breath.   Cardiovascular: Negative.   Gastrointestinal: Negative.   Genitourinary: Negative.   Musculoskeletal: Negative.   Skin:  Negative.   Neurological:  Positive for weakness.  Psychiatric/Behavioral: Negative.     Vitals:   06/24/21 0942  BP: 109/77  Pulse: 71  Temp: (!) 96.8 F (36 C)  Weight: 182 lb 6.4 oz (82.7 kg)  Height: 6' (1.829 m)   Body mass index is 24.74 kg/m. Physical Exam Constitutional: Oriented to person, place, and time. Well-developed and well-nourished.  HENT:  Head: Normocephalic.  Mouth/Throat: Oropharynx is clear and moist.  Eyes: Pupils are equal, round, and reactive to light.  Neck: Neck supple.  Cardiovascular: Normal rate and normal heart sounds.  No murmur heard. Pulmonary/Chest: Effort normal and breath sounds normal. No respiratory distress. No wheezes. Some rales Bilateral Abdominal: Soft. Bowel sounds are normal. No distension. There is no tenderness. There is no rebound.  Musculoskeletal: No edema.  Lymphadenopathy: none Neurological: Alert and oriented to person, place, and time. No Focal Deficits Skin: Skin is warm and dry.  Psychiatric: Normal mood and affect. Behavior is normal. Thought content normal.   Labs reviewed: Basic Metabolic Panel: Recent Labs    06/18/21 0430 06/19/21 0506 06/20/21 0304 06/21/21 0319  NA 138 140 140 142  K 4.3 3.6 3.7 4.2  CL 99 99 96* 100  CO2 34* 35* 34* 35*  GLUCOSE 133* 105* 113* 101*  BUN 28* '21 17 16  '$ CREATININE 0.73 0.62 0.64 0.66  CALCIUM 8.2* 8.0* 8.2* 8.4*  MG 2.2 2.1  --  2.0  PHOS 1.9* 3.0  --  3.4   Liver Function Tests: Recent Labs    06/18/21 0430 06/19/21 0506 06/20/21 0304  AST 36 21 21  ALT 148* 100* 84*  ALKPHOS 86 82 84  BILITOT 0.5 0.6 0.7  PROT 5.2* 5.4* 6.0*  ALBUMIN 2.5* 2.7* 3.1*   No results for input(s): LIPASE, AMYLASE in the last 8760 hours. No results for input(s): AMMONIA in the last 8760 hours. CBC: Recent Labs    06/17/21 0430 06/18/21 0430 06/21/21 0319  WBC 7.3 12.5* 16.7*  NEUTROABS 5.4 9.7* 12.3*  HGB 13.0 12.5* 14.5  HCT 41.1 39.9 45.6  MCV 107.0* 107.3* 104.1*   PLT 183 227 304   Cardiac Enzymes: No results for input(s): CKTOTAL, CKMB, CKMBINDEX, TROPONINI in the last 8760 hours. BNP: Invalid input(s): POCBNP Lab Results  Component Value Date   HGBA1C 5.7 02/27/2020   Lab Results  Component Value Date   TSH 1.569 11/12/2018   Lab Results  Component Value Date   VITAMINB12 882 12/19/2019   No results found for: FOLATE Lab Results  Component Value Date   FERRITIN 1,251 (H) 10/27/2020    Imaging and Procedures obtained prior to SNF admission: CT Angio Chest PE W and/or Wo Contrast  Result Date: 06/15/2021 CLINICAL DATA:  An 80 year old male with hypoxia and shortness of breath. Cough since Friday. EXAM: CT ANGIOGRAPHY CHEST WITH CONTRAST TECHNIQUE: Multidetector CT imaging of the chest was performed using the standard protocol during bolus administration of intravenous contrast. Multiplanar CT image reconstructions and MIPs were obtained to evaluate the vascular anatomy. CONTRAST:  53m OMNIPAQUE IOHEXOL 350 MG/ML SOLN COMPARISON:  January 13, 2021. FINDINGS: Cardiovascular: RIGHT-sided Port-A-Cath terminating at the caval to atrial junction. Aorta of normal caliber with scattered atheromatous plaque. Mild cardiac enlargement without pericardial effusion of substantial volume. Enlargement of pulmonary vasculature up to 3.6 cm, potentially increased from the previous exam. No signs of acute pulmonary embolism. Limited assessment beyond the lobar level in most cases particularly in the lower lobes and mid chest, visualized segmental branches without definite signs of embolism. Mediastinum/Nodes: Esophagus grossly normal. Thoracic inlet structures are normal. No axillary adenopathy. No mediastinal adenopathy.  No hilar adenopathy. Lungs/Pleura: Bandlike consolidative process, subpleural changes in the RIGHT lung with slight worsening since the previous exam. Bandlike subpleural changes in the LEFT peripheral chest in the upper lobe are stable. Worsening  of septal thickening but now with frank consolidative process at the LEFT base in the LEFT lower lobe when compared to the prior study. Juxta diaphragmatic atelectasis with elevated RIGHT hemidiaphragm with similar appearance to prior imaging. No pneumothorax. No pleural effusion. Upper Abdomen: Hepatic steatosis. Elevated RIGHT hemidiaphragm. Liver is incompletely imaged. Imaged portions of pancreas, spleen, RIGHT and LEFT kidney without acute findings. Presumed cyst of the RIGHT kidney is unchanged. No acute gastrointestinal process on limited assessment. Musculoskeletal: Spinal degenerative changes. Signs of prior trauma to the RIGHT hemithorax. Healed rib fractures along the RIGHT chest as before. No acute musculoskeletal process. Review of the MIP images confirms the above findings. IMPRESSION: No signs of pulmonary embolism at the central or lobar level and in most visualized segmental branches but with limited assessment particularly in the mid and lower chest due to respiratory motion. 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 Given findings of post inflammatory fibrosis on the previous study would suggest follow-up to ensure there is no worsening of baseline changes. Persistent elevation of the RIGHT hemidiaphragm. Correlate with any history of diaphragmatic dysfunction Enlargement of pulmonary vasculature up to 3.6 cm, potentially increased from the previous exam. Correlate with any clinical signs of pulmonary arterial hypertension. Hepatic steatosis. Aortic Atherosclerosis (ICD10-I70.0). Electronically Signed   By: GZetta BillsM.D.   On: 06/15/2021 19:42   DG Chest Portable 1 View  Result Date: 06/15/2021 CLINICAL DATA:  Shortness of breath. EXAM: PORTABLE CHEST 1 VIEW COMPARISON:  November 02, 2020 FINDINGS: There is stable right-sided venous Port-A-Cath positioning. Moderate severity right basilar linear scarring and/or  atelectasis is seen. There is no evidence of a pleural effusion or pneumothorax. Marked severity elevation of the right hemidiaphragm is seen and unchanged in severity. The heart size and mediastinal contours are within normal limits. Degenerative changes seen throughout the thoracic spine. IMPRESSION: Marked severity elevation of the right hemidiaphragm with moderate severity right basilar linear scarring and/or atelectasis. Electronically Signed   By: TVirgina NorfolkM.D.   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

## 2021-06-25 NOTE — Telephone Encounter (Signed)
Called and spoke Manuela Schwartz, [facility].  Patient scheduled hospital follow up with Beth, NP 06/30/21 at 1030.  Nothing further at this time.

## 2021-06-28 ENCOUNTER — Encounter: Payer: Self-pay | Admitting: Internal Medicine

## 2021-06-28 ENCOUNTER — Non-Acute Institutional Stay (SKILLED_NURSING_FACILITY): Payer: PPO | Admitting: Internal Medicine

## 2021-06-28 DIAGNOSIS — E7800 Pure hypercholesterolemia, unspecified: Secondary | ICD-10-CM

## 2021-06-28 DIAGNOSIS — I10 Essential (primary) hypertension: Secondary | ICD-10-CM

## 2021-06-28 DIAGNOSIS — R0609 Other forms of dyspnea: Secondary | ICD-10-CM | POA: Diagnosis not present

## 2021-06-28 DIAGNOSIS — E349 Endocrine disorder, unspecified: Secondary | ICD-10-CM

## 2021-06-28 DIAGNOSIS — I471 Supraventricular tachycardia: Secondary | ICD-10-CM

## 2021-06-28 DIAGNOSIS — J189 Pneumonia, unspecified organism: Secondary | ICD-10-CM | POA: Diagnosis not present

## 2021-06-28 DIAGNOSIS — U099 Post covid-19 condition, unspecified: Secondary | ICD-10-CM

## 2021-06-28 DIAGNOSIS — C8339 Diffuse large B-cell lymphoma, extranodal and solid organ sites: Secondary | ICD-10-CM

## 2021-06-28 DIAGNOSIS — G250 Essential tremor: Secondary | ICD-10-CM | POA: Diagnosis not present

## 2021-06-28 LAB — BASIC METABOLIC PANEL
BUN: 15 (ref 4–21)
CO2: 28 — AB (ref 13–22)
Chloride: 101 (ref 99–108)
Creatinine: 0.8 (ref 0.6–1.3)
Glucose: 55
Potassium: 4.4 (ref 3.4–5.3)
Sodium: 146 (ref 137–147)

## 2021-06-28 LAB — COMPREHENSIVE METABOLIC PANEL: Calcium: 8.8 (ref 8.7–10.7)

## 2021-06-28 NOTE — Progress Notes (Signed)
Location:   [facility] Room Number: 158 Place of Service:  SNF [phone])  Provider: Veleta Miners MD  PCP: [name], MD Patient Care Team: [name], MD as PCP - General (Internal Medicine)  Extended Emergency Contact Information Primary Emergency Contact: Jefferson Mobile Phone: [phone] Relation: Daughter Secondary Emergency Contact: Shutters,[name] Address: [address] [address] [name] of [place] Phone: [phone] Mobile Phone: [phone] Relation: Spouse  Code Status: Full Code Goals of care:  Advanced Directive information Advanced Directives 06/28/2021  Does Patient Have a Medical Advance Directive? Yes  Type of Paramedic of [place];Out of facility DNR (pink MOST or yellow form)  Does patient want to make changes to medical advance directive? No - Patient declined  Copy of [place] in Chart? Yes - validated most recent copy scanned in chart (See row information)  Pre-existing out of facility DNR order (yellow form or pink MOST form) Yellow form placed in chart (order not valid for inpatient use);Pink MOST form placed in chart (order not valid for inpatient use)     Allergies  Allergen Reactions   Latex Itching    Latex tape causes itching and redness.    Chief Complaint  Patient presents with   Discharge Note    Discharge from SNF    HPI:  80 y.o. male  Seen for Discharge from SNF  Was admitted in The hospital from 8/30 -9/5 for Multifocal Pneumonia   Patient has a history of COVID-pneumonia in January 2022. 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  Per oncology he is in remission  Benign tremors in right hand unable to sign Hypertension Arthritis  takes meloxicam   He came to ED for SOB and Fever CT Scan  showed Multifocal pneumonia All Blood cultures were Negative Treated with Rocephin  and Prednisone Taper He did developed Acute metabolic Encephalopathy in the hospital  Treated with Precedex.   In SNF for Observation and Therapy Did well. Walks with no assist Still has cough but no SOB and Not on Oxygen anymore. No Fever Wants to go home today. Does not want therapy . 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   ED (erectile dysfunction)    Elevated diaphragm    right   Erectile dysfunction    History of basal cell carcinoma (BCC) excision    multiple excision in office   History of kidney stones    History of seizure    03-03-2020 per pt x1 while taking interferan for melanoma in 2001 ,  none since, told a side effect of interferan   History of squamous cell carcinoma excision    multiple skin excision in office   Hyperlipidemia    Hypertension    followed by pcp  (03-03-2020 per pt had a stress test approx. 2010, told normal)   LBBB (left bundle branch block)    Lymphoma of testis ([place]) 2000  s/p right orchiectomy    Mass of left testis    MGUS (monoclonal gammopathy of unknown significance)    oncologist--- dr Irene Limbo   Mixed hyperlipidemia 10/26/2020   Personal history of malignant melanoma of skin dermatologist--- dr Wilhemina Bonito   dx 2001 left calf area  s/p WLE ,  completed one year interferan (03-03-2020 pt states did not involve lymph nodes, no recurrence)   Urgency of urination    Wears hearing aid in both ears     Past Surgical History:  Procedure Laterality Date   CATARACT EXTRACTION W/ INTRAOCULAR LENS  IMPLANT Bilateral 2018   IR IMAGING GUIDED PORT INSERTION  03/31/2020   MELANOMA EXCISION  10/1999   ORCHIECTOMY Right 2000   ORCHIECTOMY Left 03/05/2020   Procedure: ORCHIECTOMY;  Surgeon: [name], MD;  [facility];  Service: Urology;  Laterality: Left;   TONSILLECTOMY  child   TOTAL KNEE ARTHROPLASTY Right 06/2009      reports that he has never smoked. He has never used smokeless tobacco. He reports current alcohol use of about 14.0 standard drinks per week. He reports that he does not use drugs. Social History   Socioeconomic History   Marital status: Married    Spouse name: Not on file   Number of children: Not on file   Years of education: Not on file   Highest education level: Not on file  Occupational History   Not on file  Tobacco Use   Smoking status: Never   Smokeless tobacco: Never  Vaping Use   Vaping Use: Never used  Substance and Sexual Activity   Alcohol use: Yes    Alcohol/week: 14.0 standard drinks    Types: 14 Glasses of wine per week    Comment: 2 wine daily   Drug use: Never   Sexual activity: Not on file  Other Topics Concern   Not on file  Social History Narrative   Social History      Diet?good      Do you drink/eat things with caffeine? seldom      Marital status?          yes                          What year were you married? 1962      Do you live in a house, apartment, assisted living, condo, trailer, etc.? house      Is it one or more stories?1      How many persons live in your home? 2      Do you have any pets in your home? (please list) no      Highest level of education completed? college      Current or past profession: president and [employer]      Do you exercise?   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 yes      Do you have a DNR form?       yes  If not, do you want  to discuss one?      Do you have signed POA/HPOA for forms?  yes      Functional Status      Do you have difficulty bathing or dressing yourself? no      Do you have difficulty preparing food or eating? no      Do you have difficulty managing your medications? no      Do you have difficulty managing your finances? no      Do you have difficulty affording your medications? no   Social Determinants of Health   Financial Resource Strain: Not on file  Food Insecurity: Not on file  Transportation Needs: Not on file  Physical Activity: Not on file  Stress: Not on file  Social Connections: Not on file  Intimate Partner Violence: Not on file   Functional Status Survey:    Allergies  Allergen Reactions   Latex Itching    Latex tape causes itching and redness.    Pertinent  Health Maintenance Due  Topic Date Due   INFLUENZA VACCINE  05/17/2021   PNA vac Low Risk Adult  Completed    Medications: Allergies as of 06/28/2021       Reactions   Latex Itching   Latex tape causes itching and redness.        Medication List        Accurate as of June 28, 2021 10:17 AM. If you have any questions, ask your nurse or doctor.          STOP taking these medications    guaiFENesin-dextromethorphan 100-10 MG/5ML syrup Commonly known as: ROBITUSSIN DM Stopped by: [name], MD   predniSONE 10 MG tablet Commonly known as: DELTASONE Stopped by: [name], MD       TAKE these medications    albuterol 108 (90 Base) MCG/ACT inhaler Commonly known as: VENTOLIN HFA Inhale 2 puffs into the lungs every 6 (six) hours as needed for shortness of breath.   albuterol (2.5 MG/3ML) 0.083% nebulizer solution Commonly known as: PROVENTIL Take 3 mLs (2.5 mg total) by nebulization every 4 (four) hours as needed for shortness of breath.   carboxymethylcellulose 0.5 % Soln Commonly known as: REFRESH PLUS Place 1 drop into both eyes daily as needed.    Cholecalciferol 50 MCG (2000 UT) Caps Take 1 capsule by mouth 2 (two) times daily.   fluticasone 50 MCG/ACT nasal spray Commonly known as: FLONASE Place 2 sprays into both nostrils daily.   guaiFENesin 600 MG 12 hr tablet Commonly known as: MUCINEX Take 600 mg by mouth 2 (two) times daily.   losartan 100 MG tablet Commonly known as: COZAAR TAKE 1 TABLET BY MOUTH EVERY DAY   meloxicam 7.5 MG tablet Commonly known as: MOBIC Take 7.5 mg by mouth daily.   PRESERVISION AREDS 2 PO Take 1 capsule by mouth 2 (two) times daily.   propranolol 10 MG tablet Commonly known as: INDERAL Take 10 mg by mouth 3 (three) times daily as needed.   simvastatin 40 MG tablet Commonly known as: ZOCOR TAKE 1 TABLET BY MOUTH EVERY DAY   Testosterone 20.25 MG/ACT (1.62%) Gel Apply 40.5 mg topically daily.   TURMERIC COMPLEX/BLACK PEPPER PO Take 1,000 mg by mouth daily at 12 noon.   valACYclovir 1000 MG tablet Commonly known as: VALTREX Take 1000 (1 grams) PO 2 times daily for 3 days as needed for flares (herpes)   vitamin B-12 1000 MCG tablet Commonly  known as: CYANOCOBALAMIN Take 1,000 mcg by mouth daily.        Review of Systems  Vitals:   06/28/21 1007  BP: 110/73  Pulse: (!) 108  Resp: 16  Temp: 97.8 F (36.6 C)  SpO2: 95%  Weight: 182 lb 6.4 oz (82.7 kg)   Body mass index is 24.74 kg/m. Physical Exam Constitutional: Oriented to person, place, and time. Well-developed and well-nourished.  HENT:  Head: Normocephalic.  Mouth/Throat: Oropharynx is clear and moist.  Eyes: Pupils are equal, round, and reactive to light.  Neck: Neck supple.  Cardiovascular: Normal rate and normal heart sounds.  No murmur heard. Pulmonary/Chest: Effort normal and breath sounds normal. No respiratory distress. No wheezes. Rales in Right lower Lung and Decreased BS in Left Lower lung Abdominal: Soft. Bowel sounds are normal. No distension. There is no tenderness. There is no rebound.   Musculoskeletal: No edema.  Lymphadenopathy: none Neurological: Alert and oriented to person, place, and time.  Skin: Skin is warm and dry.  Psychiatric: Normal mood and affect. Behavior is normal. Thought content normal.   Labs reviewed: Basic Metabolic Panel: Recent Labs    06/18/21 0430 06/19/21 0506 06/20/21 0304 06/21/21 0319  NA 138 140 140 142  K 4.3 3.6 3.7 4.2  CL 99 99 96* 100  CO2 34* 35* 34* 35*  GLUCOSE 133* 105* 113* 101*  BUN 28* '21 17 16  '$ CREATININE 0.73 0.62 0.64 0.66  CALCIUM 8.2* 8.0* 8.2* 8.4*  MG 2.2 2.1  --  2.0  PHOS 1.9* 3.0  --  3.4   Liver Function Tests: Recent Labs    06/18/21 0430 06/19/21 0506 06/20/21 0304  AST 36 21 21  ALT 148* 100* 84*  ALKPHOS 86 82 84  BILITOT 0.5 0.6 0.7  PROT 5.2* 5.4* 6.0*  ALBUMIN 2.5* 2.7* 3.1*   No results for input(s): LIPASE, AMYLASE in the last 8760 hours. No results for input(s): AMMONIA in the last 8760 hours. CBC: Recent Labs    06/17/21 0430 06/18/21 0430 06/21/21 0319  WBC 7.3 12.5* 16.7*  NEUTROABS 5.4 9.7* 12.3*  HGB 13.0 12.5* 14.5  HCT 41.1 39.9 45.6  MCV 107.0* 107.3* 104.1*  PLT 183 227 304   Cardiac Enzymes: No results for input(s): CKTOTAL, CKMB, CKMBINDEX, TROPONINI in the last 8760 hours. BNP: Invalid input(s): POCBNP CBG: Recent Labs    10/28/20 0055 11/03/20 0855 06/16/21 1749  GLUCAP 191* 200* 172*    Procedures and Imaging Studies During Stay: CT Angio Chest PE W and/or Wo Contrast  Result Date: 06/15/2021 CLINICAL DATA:  An 80 year old male with hypoxia and shortness of breath. Cough since Friday. EXAM: CT ANGIOGRAPHY CHEST WITH CONTRAST TECHNIQUE: Multidetector CT imaging of the chest was performed using the standard protocol during bolus administration of intravenous contrast. Multiplanar CT image reconstructions and MIPs were obtained to evaluate the vascular anatomy. CONTRAST:  46m OMNIPAQUE IOHEXOL 350 MG/ML SOLN COMPARISON:  January 13, 2021. FINDINGS:  Cardiovascular: RIGHT-sided Port-A-Cath terminating at the caval to atrial junction. Aorta of normal caliber with scattered atheromatous plaque. Mild cardiac enlargement without pericardial effusion of substantial volume. Enlargement of pulmonary vasculature up to 3.6 cm, potentially increased from the previous exam. No signs of acute pulmonary embolism. Limited assessment beyond the lobar level in most cases particularly in the lower lobes and mid chest, visualized segmental branches without definite signs of embolism. Mediastinum/Nodes: Esophagus grossly normal. Thoracic inlet structures are normal. No axillary adenopathy. No mediastinal adenopathy.  No hilar adenopathy. Lungs/Pleura:  Bandlike consolidative process, subpleural changes in the RIGHT lung with slight worsening since the previous exam. Bandlike subpleural changes in the LEFT peripheral chest in the upper lobe are stable. Worsening of septal thickening but now with frank consolidative process at the LEFT base in the LEFT lower lobe when compared to the prior study. Juxta diaphragmatic atelectasis with elevated RIGHT hemidiaphragm with similar appearance to prior imaging. No pneumothorax. No pleural effusion. Upper Abdomen: Hepatic steatosis. Elevated RIGHT hemidiaphragm. Liver is incompletely imaged. Imaged portions of pancreas, spleen, RIGHT and LEFT kidney without acute findings. Presumed cyst of the RIGHT kidney is unchanged. No acute gastrointestinal process on limited assessment. Musculoskeletal: Spinal degenerative changes. Signs of prior trauma to the RIGHT hemithorax. Healed rib fractures along the RIGHT chest as before. No acute musculoskeletal process. Review of the MIP images confirms the above findings. IMPRESSION: No signs of pulmonary embolism at the central or lobar level and in most visualized segmental branches but with limited assessment particularly in the mid and lower chest due to respiratory motion. Findings that favor pneumonia  superimposed on post inflammatory fibrosis in the LEFT upper lobe and RIGHT upper lobe. Given findings of post inflammatory fibrosis on the previous study would suggest follow-up to ensure there is no worsening of baseline changes. Persistent elevation of the RIGHT hemidiaphragm. Correlate with any history of diaphragmatic dysfunction Enlargement of pulmonary vasculature up to 3.6 cm, potentially increased from the previous exam. Correlate with any clinical signs of pulmonary arterial hypertension. Hepatic steatosis. Aortic Atherosclerosis (ICD10-I70.0). Electronically Signed   By: Zetta Bills M.D.   On: 06/15/2021 19:42   DG Chest Portable 1 View  Result Date: 06/15/2021 CLINICAL DATA:  Shortness of breath. EXAM: PORTABLE CHEST 1 VIEW COMPARISON:  November 02, 2020 FINDINGS: There is stable right-sided venous Port-A-Cath positioning. Moderate severity right basilar linear scarring and/or atelectasis is seen. There is no evidence of a pleural effusion or pneumothorax. Marked severity elevation of the right hemidiaphragm is seen and unchanged in severity. The heart size and mediastinal contours are within normal limits. Degenerative changes seen throughout the thoracic spine. IMPRESSION: Marked severity elevation of the right hemidiaphragm with moderate severity right basilar linear scarring and/or atelectasis. Electronically Signed   By: Virgina Norfolk M.D.   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

## 2021-06-30 ENCOUNTER — Ambulatory Visit: Payer: PPO | Admitting: Primary Care

## 2021-06-30 LAB — MAGNESIUM: Magnesium: 2.3

## 2021-06-30 LAB — PHOSPHORUS: Phosphorus: 3.8

## 2021-07-02 ENCOUNTER — Ambulatory Visit: Payer: PPO | Admitting: Adult Health

## 2021-07-02 ENCOUNTER — Ambulatory Visit (INDEPENDENT_AMBULATORY_CARE_PROVIDER_SITE_OTHER): Payer: PPO

## 2021-07-02 ENCOUNTER — Other Ambulatory Visit: Payer: Self-pay

## 2021-07-02 ENCOUNTER — Encounter: Payer: Self-pay | Admitting: Adult Health

## 2021-07-02 VITALS — BP 100/58 | HR 91 | Temp 98.3°F | Ht 72.0 in | Wt 183.2 lb

## 2021-07-02 DIAGNOSIS — Z23 Encounter for immunization: Secondary | ICD-10-CM | POA: Diagnosis not present

## 2021-07-02 DIAGNOSIS — J181 Lobar pneumonia, unspecified organism: Secondary | ICD-10-CM

## 2021-07-02 DIAGNOSIS — Z8701 Personal history of pneumonia (recurrent): Secondary | ICD-10-CM | POA: Diagnosis not present

## 2021-07-02 DIAGNOSIS — J9611 Chronic respiratory failure with hypoxia: Secondary | ICD-10-CM | POA: Diagnosis not present

## 2021-07-02 DIAGNOSIS — R918 Other nonspecific abnormal finding of lung field: Secondary | ICD-10-CM | POA: Diagnosis not present

## 2021-07-02 DIAGNOSIS — R0609 Other forms of dyspnea: Secondary | ICD-10-CM | POA: Diagnosis not present

## 2021-07-02 DIAGNOSIS — J189 Pneumonia, unspecified organism: Secondary | ICD-10-CM

## 2021-07-02 DIAGNOSIS — U099 Post covid-19 condition, unspecified: Secondary | ICD-10-CM | POA: Diagnosis not present

## 2021-07-02 NOTE — Progress Notes (Signed)
$'@Patient'W$  ID: Antonio [name], male    DOB: 08-01-1941, 80 y.o.   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  He was hospitalized January 2022 for COVID-19 , Covid Pneumonia and Acute Respiratory failure .   He was treated with remdesivir and prednisone.  Hospital course was complicated by SVT and diastolic heart failure decompensation.  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  Received chemotherapy until late 2021   TEST/EVENTS :  High-resolution CT chest March 2022 chronic prominent elevation the right hemidiaphragm with associated passive right lung atelectasis including complete right middle lobe atelectasis.  Extensive patchy groundglass opacities, reticulation throughout both lungs with associated mild traction bronchiectasis textural distortion and volume loss.  Overall groundglass opacities have improved.  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  No significant recent travel Relevant family history: No family history of lung disease  07/02/2021 Follow up : Postinflammatory fibrosis-post COVID, oxygen dependent respiratory failure, post hospital follow-up recent multifocal pneumonia Patient returns for a follow-up visit.  Patient was recently admitted for multifocal pneumonia with acute hypoxic respiratory failure.  He required aggressive treatment with IV antibiotics, nebulized bronchodilators oxygen.  He did require BiPAP support initially.  CT chest June 15, 2021 showed negative for PE.  Worsening areas of consolidation along the right lung.  And left base.  Consistent with a multifocal pneumonia.  He was discharged to a skilled rehab center.  Hospital course was complicated by acute encephalopathy and agitation.  Patient denies any dysphagia or choking.  No reflux. Patient says since discharge home he is feeling much better.  He is returned back to his normal activities.  Patient says he is quite active goes to the gym on a regular basis.  Patient does have some decreased stamina and endurance gets a little short of breath with activities.  He does monitor his oxygen throughout the day and says his O2 saturations have maintained greater than 90%.  Today in the office we did do a walk test that showed O2 saturations walking at 96 to 97% on room air.  Patient is on oxygen at bedtime at 2 L.  Patient says if he does exercise heavy he may drop his oxygen levels down into the 80s.  If he will sit and rest oxygen levels will come back up.  We discussed the potential complications of exertional hypoxemia.  He does not have a portable concentrator he has heavy oxygen tanks.  Says he cannot use those in the gym. Patient does have an albuterol inhaler and nebulizer.  Says on occasion that he uses those. Appetite is good with no nausea vomiting or diarrhea.  Allergies  Allergen Reactions   Latex Itching    Latex tape causes itching and redness.    Immunization History  Administered Date(s) Administered   Fluad Quad(high Dose 65+) 07/02/2021   Influenza, High Dose Seasonal PF 07/15/2017, 07/16/2018, 06/11/2019, 07/13/2020   Moderna SARS-COV2 Booster Vaccination 01/25/2021   Moderna Sars-Covid-2 Vaccination 10/29/2019, 11/26/2019, 06/11/2020   Pneumococcal Conjugate-13  03/04/2014   Pneumococcal Polysaccharide-23 07/27/2010   Tdap 03/04/2014   Zoster Recombinat (Shingrix) 10/06/2017, 12/09/2017   Zoster, Live 09/22/2006    Past Medical History:   Diagnosis Date   Age-related macular degeneration, dry, both eyes    B-cell lymphoma [facility]) oncologist--- dr Irene Limbo   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   ED (erectile dysfunction)    Elevated diaphragm    right   Erectile dysfunction    History of basal cell carcinoma (BCC) excision    multiple excision in office   History of kidney stones    History of seizure    03-03-2020 per pt x1 while taking interferan for melanoma in 2001 ,  none since, told a side effect of interferan   History of squamous cell carcinoma excision    multiple skin excision in office   Hyperlipidemia    Hypertension    followed by pcp  (03-03-2020 per pt had a stress test approx. 2010, told normal)   LBBB (left bundle branch block)    Lymphoma of testis ([place]) 2000   s/p right orchiectomy    Mass of left testis    MGUS (monoclonal gammopathy of unknown significance)    oncologist--- dr Irene Limbo   Mixed hyperlipidemia 10/26/2020   Personal history of malignant melanoma of skin dermatologist--- dr Wilhemina Bonito   dx 2001 left calf area  s/p WLE ,  completed one year interferan (03-03-2020 pt states did not involve lymph nodes, no recurrence)   Urgency of urination    Wears hearing aid in both ears     Tobacco History: Social History   Tobacco Use  Smoking Status Never  Smokeless Tobacco Never   Counseling given: Not Answered   Outpatient Medications Prior to Visit  Medication Sig Dispense Refill   albuterol (PROVENTIL) (2.5 MG/3ML) 0.083% nebulizer solution Take 3 mLs (2.5 mg total) by nebulization every 4 (four) hours as needed for shortness of breath. 75 mL 12   albuterol (VENTOLIN HFA) 108 (90 Base) MCG/ACT inhaler Inhale 2 puffs into the lungs every 6 (six) hours as needed for shortness of breath. 8 g 2   Black  Pepper-Turmeric (TURMERIC COMPLEX/BLACK PEPPER PO) Take 1,000 mg by mouth daily at 12 noon.     carboxymethylcellulose (REFRESH PLUS) 0.5 % SOLN Place 1 drop into both eyes daily as needed.     Cholecalciferol 50 MCG (2000 UT) CAPS Take 1 capsule by mouth 2 (two) times daily.     fluticasone (FLONASE) 50 MCG/ACT nasal spray Place 2 sprays into both nostrils daily. 11.1 mL 5   losartan (COZAAR) 100 MG tablet TAKE 1 TABLET BY MOUTH EVERY DAY 90 tablet 1   meloxicam (MOBIC) 7.5 MG tablet Take 7.5 mg by mouth daily.     Multiple Vitamins-Minerals (PRESERVISION AREDS 2 PO) Take 1 capsule by mouth 2 (two) times daily.      propranolol (INDERAL) 10 MG tablet Take 10 mg by mouth 3 (three) times daily as needed.     simvastatin (ZOCOR) 40 MG tablet TAKE 1 TABLET BY MOUTH EVERY DAY 90 tablet 0   Testosterone 20.25 MG/ACT (1.62%) GEL Apply 40.5 mg topically daily.     valACYclovir (VALTREX) 1000 MG tablet Take 1000 (1 grams) PO 2 times daily for 3 days as needed for flares (herpes)  vitamin B-12 (CYANOCOBALAMIN) 1000 MCG tablet Take 1,000 mcg by mouth daily.     No facility-administered medications prior to visit.     Review of Systems:   Constitutional:   No  weight loss, night sweats,  Fevers, chills,  +fatigue, or  lassitude.  HEENT:   No headaches,  Difficulty swallowing,  Tooth/dental problems, or  Sore throat,                No sneezing, itching, ear ache, nasal congestion, post nasal drip,   CV:  No chest pain,  Orthopnea, PND, swelling in lower extremities, anasarca, dizziness, palpitations, syncope.   GI  No heartburn, indigestion, abdominal pain, nausea, vomiting, diarrhea, change in bowel habits, loss of appetite, bloody stools.   Resp: .  No chest wall deformity  Skin: no rash or lesions.  GU: no dysuria, change in color of urine, no urgency or frequency.  No flank pain, no hematuria   MS:  No joint pain or swelling.  No decreased range of motion.  No back pain.    Physical  Exam  BP (!) 100/58 (BP Location: Left Arm, Patient Position: Sitting, Cuff Size: Normal)   Pulse 91   Temp 98.3 F (36.8 C) (Oral)   Ht 6' (1.829 m)   Wt 183 lb 3.2 oz (83.1 kg)   SpO2 98%   BMI 24.85 kg/m   GEN: A/Ox3; pleasant , NAD, well nourished , thin    HEENT:  /AT,   NOSE-clear, THROAT-clear, no lesions, no postnasal drip or exudate noted.   NECK:  Supple w/ fair ROM; no JVD; normal carotid impulses w/o bruits; no thyromegaly or nodules palpated; no lymphadenopathy.    RESP  Clear  P & A; w/o, wheezes/ rales/ or rhonchi. no accessory muscle use, no dullness to percussion  CARD:  RRR, no m/r/g, no peripheral edema, pulses intact, no cyanosis or clubbing.  GI:   Soft & nt; nml bowel sounds; no organomegaly or masses detected.   Musco: Warm bil, no deformities or joint swelling noted.   Neuro: alert, no focal deficits noted.    Skin: Warm, no lesions or rashes    Lab Results:   BMET   BNP   ProBNP No results found for: PROBNP  Imaging: CT Angio Chest PE W and/or Wo Contrast  Result Date: 06/15/2021 CLINICAL DATA:  An 80 year old male with hypoxia and shortness of breath. Cough since Friday. EXAM: CT ANGIOGRAPHY CHEST WITH CONTRAST TECHNIQUE: Multidetector CT imaging of the chest was performed using the standard protocol during bolus administration of intravenous contrast. Multiplanar CT image reconstructions and MIPs were obtained to evaluate the vascular anatomy. CONTRAST:  73m OMNIPAQUE IOHEXOL 350 MG/ML SOLN COMPARISON:  January 13, 2021. FINDINGS: Cardiovascular: RIGHT-sided Port-A-Cath terminating at the caval to atrial junction. Aorta of normal caliber with scattered atheromatous plaque. Mild cardiac enlargement without pericardial effusion of substantial volume. Enlargement of pulmonary vasculature up to 3.6 cm, potentially increased from the previous exam. No signs of acute pulmonary embolism. Limited assessment beyond the lobar level in most cases  particularly in the lower lobes and mid chest, visualized segmental branches without definite signs of embolism. Mediastinum/Nodes: Esophagus grossly normal. Thoracic inlet structures are normal. No axillary adenopathy. No mediastinal adenopathy.  No hilar adenopathy. Lungs/Pleura: Bandlike consolidative process, subpleural changes in the RIGHT lung with slight worsening since the previous exam. Bandlike subpleural changes in the LEFT peripheral chest in the upper lobe are stable. Worsening of septal thickening but now with frank  consolidative process at the LEFT base in the LEFT lower lobe when compared to the prior study. Juxta diaphragmatic atelectasis with elevated RIGHT hemidiaphragm with similar appearance to prior imaging. No pneumothorax. No pleural effusion. Upper Abdomen: Hepatic steatosis. Elevated RIGHT hemidiaphragm. Liver is incompletely imaged. Imaged portions of pancreas, spleen, RIGHT and LEFT kidney without acute findings. Presumed cyst of the RIGHT kidney is unchanged. No acute gastrointestinal process on limited assessment. Musculoskeletal: Spinal degenerative changes. Signs of prior trauma to the RIGHT hemithorax. Healed rib fractures along the RIGHT chest as before. No acute musculoskeletal process. Review of the MIP images confirms the above findings. IMPRESSION: No signs of pulmonary embolism at the central or lobar level and in most visualized segmental branches but with limited assessment particularly in the mid and lower chest due to respiratory motion. 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 Given findings of post inflammatory fibrosis on the previous study would suggest follow-up to ensure there is no worsening of baseline changes. Persistent elevation of the RIGHT hemidiaphragm. Correlate with any history of diaphragmatic dysfunction Enlargement of pulmonary vasculature up to 3.6 cm, potentially increased from  the previous exam. Correlate with any clinical signs of pulmonary arterial hypertension. Hepatic steatosis. Aortic Atherosclerosis (ICD10-I70.0). Electronically Signed   By: Zetta Bills M.D.   On: 06/15/2021 19:42   DG Chest Portable 1 View  Result Date: 06/15/2021 CLINICAL DATA:  Shortness of breath. EXAM: PORTABLE CHEST 1 VIEW COMPARISON:  November 02, 2020 FINDINGS: There is stable right-sided venous Port-A-Cath positioning. Moderate severity right basilar linear scarring and/or atelectasis is seen. There is no evidence of a pleural effusion or pneumothorax. Marked severity elevation of the right hemidiaphragm is seen and unchanged in severity. The heart size and mediastinal contours are within normal limits. Degenerative changes seen throughout the thoracic spine. IMPRESSION: Marked severity elevation of the right hemidiaphragm with moderate severity right basilar linear scarring and/or atelectasis. Electronically Signed   By: Virgina Norfolk M.D.   On: 06/15/2021 18:22      No flowsheet data found.  No results found for: NITRICOXIDE      Assessment & Plan:   Multifocal pneumonia Recent hospitalization with multifocal pneumonia clinically patient is improved after complete antibiotic course. Chest x-ray today. Follow-up in 4 weeks and as needed  Plan  Patient Instructions  Chest xray today .  Activity as tolerated.  Flu shot today .  Wear O2 2l/m to keep O2 sats >88-90%  and At bedtime   Follow up with Dr. Vaughan Browner in 4 weeks and As needed           Chronic respiratory failure with hypoxia ([place]) Continue on oxygen 2 L with activity to maintain O2 saturations greater than 88 to 90% and at bedtime.  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 Recent admission with multifocal pneumonia.  Patient is advance activity as tolerated.  He does go to the gym on a routine basis.  Plan  Patient  Instructions  Chest xray today .  Activity as tolerated.  Flu shot today .  Wear O2 2l/m to keep O2 sats >88-90%  and At bedtime   Follow up with Dr. Vaughan Browner in 4 weeks and As needed             Rexene Edison, NP 07/02/2021

## 2021-07-02 NOTE — Assessment & Plan Note (Signed)
Recent hospitalization with multifocal pneumonia clinically patient is improved after complete antibiotic course. Chest x-ray today. Follow-up in 4 weeks and as needed  Plan  Patient Instructions  Chest xray today .  Activity as tolerated.  Flu shot today .  Wear O2 2l/m to keep O2 sats >88-90%  and At bedtime   Follow up with Dr. Vaughan Browner in 4 weeks and As needed

## 2021-07-02 NOTE — Patient Instructions (Addendum)
Chest xray today .  Activity as tolerated.  Flu shot today .  Wear O2 2l/m to keep O2 sats >88-90%  and At bedtime   Follow up with Dr. Vaughan Browner in 4 weeks and As needed

## 2021-07-02 NOTE — Assessment & Plan Note (Signed)
Continue on oxygen 2 L with activity to maintain O2 saturations greater than 88 to 90% and at bedtime.

## 2021-07-02 NOTE — Assessment & Plan Note (Signed)
Patient with some postinflammatory changes after COVID-19 in January 2022. Recent admission with multifocal pneumonia.  Patient is advance activity as tolerated.  He does go to the gym on a routine basis.  Plan  Patient Instructions  Chest xray today .  Activity as tolerated.  Flu shot today .  Wear O2 2l/m to keep O2 sats >88-90%  and At bedtime   Follow up with Dr. Vaughan Browner in 4 weeks and As needed

## 2021-07-07 NOTE — Progress Notes (Signed)
Called and spoke with patient, advised of results/recommendations per Tammy Parrett NP.  He verbalized understanding.  Nothing further needed.

## 2021-07-08 DIAGNOSIS — E291 Testicular hypofunction: Secondary | ICD-10-CM | POA: Diagnosis not present

## 2021-07-12 DIAGNOSIS — Z20822 Contact with and (suspected) exposure to covid-19: Secondary | ICD-10-CM | POA: Diagnosis not present

## 2021-07-13 ENCOUNTER — Other Ambulatory Visit: Payer: Self-pay | Admitting: *Deleted

## 2021-07-13 DIAGNOSIS — U071 Immunodeficiency, unspecified: Secondary | ICD-10-CM

## 2021-07-13 MED ORDER — SIMVASTATIN 40 MG PO TABS: 40.0000 mg | ORAL_TABLET | Freq: Every day | ORAL | 1 refills | Status: DC

## 2021-07-13 MED ORDER — FLUTICASONE PROPIONATE 50 MCG/ACT NA SUSP: 2.0000 | Freq: Every day | NASAL | 5 refills | Status: DC

## 2021-07-13 NOTE — Telephone Encounter (Signed)
Received refill Request from Pharmacy.  Pended Rx and sent to Dr. Lyndel Safe for approval due to [place] Warning.

## 2021-07-13 NOTE — Telephone Encounter (Signed)
Received refill Request from pharmacy.  

## 2021-07-13 NOTE — Addendum Note (Signed)
Addended by: Rafael Bihari A on: 07/13/2021 09:40 AM   Modules accepted: Orders

## 2021-07-13 NOTE — Addendum Note (Signed)
Addended by: Rafael Bihari A on: 07/13/2021 09:38 AM   Modules accepted: Orders

## 2021-07-15 ENCOUNTER — Other Ambulatory Visit: Payer: Self-pay | Admitting: Adult Health

## 2021-07-15 MED ORDER — ALBUTEROL SULFATE HFA 108 (90 BASE) MCG/ACT IN AERS: 2.0000 | INHALATION_SPRAY | Freq: Four times a day (QID) | RESPIRATORY_TRACT | 2 refills | Status: DC | PRN

## 2021-07-15 MED ORDER — ALBUTEROL SULFATE (2.5 MG/3ML) 0.083% IN NEBU: 2.5000 mg | INHALATION_SOLUTION | RESPIRATORY_TRACT | 12 refills | Status: DC | PRN

## 2021-07-16 ENCOUNTER — Encounter: Payer: Self-pay | Admitting: Hematology

## 2021-07-19 ENCOUNTER — Other Ambulatory Visit: Payer: Self-pay | Admitting: Adult Health

## 2021-07-19 DIAGNOSIS — E291 Testicular hypofunction: Secondary | ICD-10-CM | POA: Diagnosis not present

## 2021-07-19 DIAGNOSIS — R5383 Other fatigue: Secondary | ICD-10-CM | POA: Diagnosis not present

## 2021-07-19 MED ORDER — ALBUTEROL SULFATE (2.5 MG/3ML) 0.083% IN NEBU: 2.5000 mg | INHALATION_SOLUTION | RESPIRATORY_TRACT | 12 refills | Status: DC | PRN

## 2021-07-22 DIAGNOSIS — J9601 Acute respiratory failure with hypoxia: Secondary | ICD-10-CM | POA: Diagnosis not present

## 2021-08-13 ENCOUNTER — Encounter: Payer: Self-pay | Admitting: Pulmonary Disease

## 2021-08-13 ENCOUNTER — Other Ambulatory Visit: Payer: Self-pay

## 2021-08-13 ENCOUNTER — Ambulatory Visit: Payer: PPO | Admitting: Pulmonary Disease

## 2021-08-13 VITALS — BP 116/80 | HR 82 | Ht 72.0 in | Wt 182.0 lb

## 2021-08-13 DIAGNOSIS — J181 Lobar pneumonia, unspecified organism: Secondary | ICD-10-CM | POA: Diagnosis not present

## 2021-08-13 DIAGNOSIS — U099 Post covid-19 condition, unspecified: Secondary | ICD-10-CM | POA: Diagnosis not present

## 2021-08-13 DIAGNOSIS — J9611 Chronic respiratory failure with hypoxia: Secondary | ICD-10-CM | POA: Diagnosis not present

## 2021-08-13 DIAGNOSIS — R0602 Shortness of breath: Secondary | ICD-10-CM | POA: Diagnosis not present

## 2021-08-13 DIAGNOSIS — R0609 Other forms of dyspnea: Secondary | ICD-10-CM

## 2021-08-13 NOTE — Addendum Note (Signed)
Addended by: Valerie Salts on: 08/13/2021 12:27 PM   Modules accepted: Orders

## 2021-08-13 NOTE — Progress Notes (Signed)
Antonio [name]    [id number]    [birth year]  Primary Care Physician:Gupta, Rene Kocher, MD  Referring Physician: [name], MD [place],  [place] [id number]   Chief complaint:  Follow up post Covid syndrome, hospitalization for pneumonia  HPI: 80 year old with history of hypertension, recurrent testicular large B-cell lymphoma Hospitalized for COVID-19 in January 2022.  He was immunized and boosted against COVID Treated with remdesivir, prednisone.  Hospital course complicated by SVT, diastolic heart failure.  He went to rehab till mid February.  Weaned off oxygen before being sent home. He was on a Pred taper till mid February.  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  Reports slightly increased dyspnea with congestion over the past week with O2 sats dropping to the mid 80s.  He also has post Covid cognitive issues and forgetfulness. Denies any fevers, chills.  He has history of large B-cell lymphoma in 2009 recurrence in 2021.  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  No significant recent travel Relevant family history: No family history of lung disease  Interim history: Hospitalized in August 2022 for multifocal pneumonia.  He required BiPAP initially.  Treated with IV antibiotics, bronchodilators.  CT chest on admission was negative for PE with bilateral infiltrates suspicious for multifocal pneumonia.  Hospital course was complicated by agitation and encephalopathy He went to pulmonary rehab and then home  Overall he is much improved.  He is weaned himself off oxygen but still uses a Marine scientist for  travel. Back to baseline now  Outpatient Encounter Medications as of 08/13/2021  Medication Sig   albuterol (PROVENTIL) (2.5 MG/3ML) 0.083% nebulizer solution Take 3 mLs (2.5 mg total) by nebulization every 4 (four) hours as needed for shortness of breath.   albuterol (VENTOLIN HFA) 108 (90 Base) MCG/ACT inhaler Inhale 2 puffs into the lungs every 6 (six) hours as needed for shortness of breath.   Black Pepper-Turmeric (TURMERIC COMPLEX/BLACK PEPPER PO) Take 1,000 mg by mouth daily at 12 noon.   carboxymethylcellulose (REFRESH PLUS) 0.5 % SOLN Place 1 drop into both eyes daily as needed.   Cholecalciferol 50 MCG (2000 UT) CAPS Take 1 capsule by mouth 2 (two) times daily.   fluticasone (FLONASE) 50 MCG/ACT nasal spray Place 2 sprays into both nostrils daily.   losartan (COZAAR) 100 MG tablet TAKE 1 TABLET BY MOUTH EVERY DAY   meloxicam (MOBIC) 7.5 MG tablet Take 7.5 mg by mouth daily.   Multiple Vitamins-Minerals (PRESERVISION AREDS 2 PO) Take 1 capsule by mouth 2 (two) times daily.    propranolol (INDERAL) 10 MG tablet Take 10 mg by mouth 3 (three) times daily as needed.   simvastatin (ZOCOR) 40 MG tablet Take 1 tablet (40 mg total) by mouth daily.   Testosterone 20.25 MG/ACT (1.62%) GEL Apply 40.5 mg topically daily.   valACYclovir (VALTREX) 1000 MG tablet Take 1000 (1 grams) PO 2 times daily for 3 days as needed for flares (herpes)   vitamin B-12 (CYANOCOBALAMIN) 1000 MCG tablet Take 1,000 mcg by mouth daily.   No facility-administered encounter medications on file as of 08/13/2021.    Allergies as of 08/13/2021 - Review Complete 08/13/2021  Allergen Reaction Noted   Latex Itching 03/12/2020   Physical Exam: Blood pressure 116/80, pulse 82, height 6' (1.829 m), weight 182 lb (82.6 kg), SpO2 96 %. Gen:      No acute distress HEENT:  EOMI, sclera anicteric Neck:     No masses; no thyromegaly Lungs:    Clear to auscultation bilaterally; normal respiratory effort CV:         Regular  rate and rhythm; no murmurs Abd:      + bowel sounds; soft, non-tender; no palpable masses, no distension Ext:    No edema; adequate peripheral perfusion Skin:      Warm and dry; no rash Neuro: alert and oriented x 3 Psych: normal mood and affect  Data Reviewed: Imaging: CT chest 10/20/2020-multifocal infiltrates consistent with pneumonia CTA 10/26/2020-no PE, multifocal infiltrates.   High-resolution CT 01/13/2021- bilateral groundglass opacities, reticulation with mild traction bronchiectasis consistent with post-COVID ILD CT angiogram 06/15/2021-no PE, multifocal infiltrates I have reviewed the images personally.  PFTs:   Labs:  Assessment:  Post Covid 19, recent hospitalization for multifocal pneumonia Overall he is improving and appears back to baseline  Overall he is doing well, weaned himself off continuous oxygen He still uses the portable concentrator while traveling on the flight and would like that renewed  We will get a follow-up CT high-resolution in 3 months to ensure resolution of infiltrates from pneumonia and reassess post-COVID ILD  Plan/Recommendations: Continue supplemental oxygen via portable concentrator Follow-up CT in 3 months  Marshell Garfinkel MD [facility] 08/13/2021, 10:43 AM  CC: [name], MD

## 2021-08-13 NOTE — Patient Instructions (Signed)
We will get a high-resolution CT in 3 to 4 months for follow-up of lung inflammation We will send the order for portable concentrator to DME company and give you a prescription Continue to stay active Follow-up in 4 months after CT scan

## 2021-08-19 ENCOUNTER — Other Ambulatory Visit: Payer: Self-pay

## 2021-08-19 ENCOUNTER — Inpatient Hospital Stay: Payer: PPO | Admitting: Hematology

## 2021-08-19 ENCOUNTER — Inpatient Hospital Stay: Payer: PPO | Attending: Hematology

## 2021-08-19 VITALS — BP 143/77 | HR 65 | Temp 97.9°F | Resp 18 | Wt 187.7 lb

## 2021-08-19 DIAGNOSIS — C8339 Diffuse large B-cell lymphoma, extranodal and solid organ sites: Secondary | ICD-10-CM | POA: Insufficient documentation

## 2021-08-19 LAB — CBC WITH DIFFERENTIAL/PLATELET
Abs Immature Granulocytes: 0.03 10*3/uL (ref 0.00–0.07)
Basophils Absolute: 0 10*3/uL (ref 0.0–0.1)
Basophils Relative: 1 %
Eosinophils Absolute: 0.1 10*3/uL (ref 0.0–0.5)
Eosinophils Relative: 2 %
HCT: 44.5 % (ref 39.0–52.0)
Hemoglobin: 14.9 g/dL (ref 13.0–17.0)
Immature Granulocytes: 1 %
Lymphocytes Relative: 20 %
Lymphs Abs: 1.2 10*3/uL (ref 0.7–4.0)
MCH: 33.9 pg (ref 26.0–34.0)
MCHC: 33.5 g/dL (ref 30.0–36.0)
MCV: 101.1 fL — ABNORMAL HIGH (ref 80.0–100.0)
Monocytes Absolute: 0.6 10*3/uL (ref 0.1–1.0)
Monocytes Relative: 10 %
Neutro Abs: 4 10*3/uL (ref 1.7–7.7)
Neutrophils Relative %: 66 %
Platelets: 189 10*3/uL (ref 150–400)
RBC: 4.4 MIL/uL (ref 4.22–5.81)
RDW: 13.6 % (ref 11.5–15.5)
WBC: 6 10*3/uL (ref 4.0–10.5)
nRBC: 0 % (ref 0.0–0.2)

## 2021-08-19 LAB — CMP (CANCER CENTER ONLY)
ALT: 14 U/L (ref 0–44)
AST: 15 U/L (ref 15–41)
Albumin: 4 g/dL (ref 3.5–5.0)
Alkaline Phosphatase: 57 U/L (ref 38–126)
Anion gap: 7 (ref 5–15)
BUN: 17 mg/dL (ref 8–23)
CO2: 30 mmol/L (ref 22–32)
Calcium: 9 mg/dL (ref 8.9–10.3)
Chloride: 105 mmol/L (ref 98–111)
Creatinine: 0.8 mg/dL (ref 0.61–1.24)
GFR, Estimated: >60 mL/min (ref >60)
Glucose, Bld: 89 mg/dL (ref 70–99)
Potassium: 4.1 mmol/L (ref 3.5–5.1)
Sodium: 142 mmol/L (ref 135–145)
Total Bilirubin: 1 mg/dL (ref 0.3–1.2)
Total Protein: 6.6 g/dL (ref 6.5–8.1)

## 2021-08-19 LAB — LACTATE DEHYDROGENASE: LDH: 131 U/L (ref 98–192)

## 2021-08-24 LAB — MULTIPLE MYELOMA PANEL, SERUM
Albumin SerPl Elph-Mcnc: 3.8 g/dL (ref 2.9–4.4)
Albumin/Glob SerPl: 1.6 (ref 0.7–1.7)
Alpha 1: 0.2 g/dL (ref 0.0–0.4)
Alpha2 Glob SerPl Elph-Mcnc: 0.6 g/dL (ref 0.4–1.0)
B-Globulin SerPl Elph-Mcnc: 0.7 g/dL (ref 0.7–1.3)
Gamma Glob SerPl Elph-Mcnc: 0.8 g/dL (ref 0.4–1.8)
Globulin, Total: 2.4 g/dL (ref 2.2–3.9)
IgA: 81 mg/dL (ref 61–437)
IgG (Immunoglobin G), Serum: 634 mg/dL (ref 603–1613)
IgM (Immunoglobulin M), Srm: 349 mg/dL — ABNORMAL HIGH (ref 15–143)
M Protein SerPl Elph-Mcnc: 0.3 g/dL — ABNORMAL HIGH
Total Protein ELP: 6.2 g/dL (ref 6.0–8.5)

## 2021-08-25 ENCOUNTER — Encounter: Payer: Self-pay | Admitting: Hematology

## 2021-08-25 DIAGNOSIS — J9601 Acute respiratory failure with hypoxia: Secondary | ICD-10-CM | POA: Diagnosis not present

## 2021-08-25 NOTE — Progress Notes (Signed)
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  HISTORY OF PRESENTING ILLNESS:   Antonio [name] is a wonderful 80 y.o. male who has been referred to [place] by Dr. Hollace [name] for evaluation and management of Bone lesion of left lower leg. The pt reports that he is doing well [name].   The pt reports that he cannot feel the ovoid finding from his 04/09/18 MRI as noted below. He denies being able to feel anything in his skin as well. He notes that he received a needle injection in his knee prior to this MRI. The pt notes that his knee has lost much cartilage and is considering a knee replacement. He denies any recent injuries to his knee or falls. He has contacted Dr. Alphonsa [name] in surgery and another surgeon as well and will be making an appointment.   The pt notes that he does not feel any differently recently as compared to 6 months to a year ago. He does note that he normally has constipation in the spring, and has continued to have mild constipation. He denies any other concerns or symptoms.   The pt previously saw my colleague Dr. Lurline [name] for primary testicular B-cell lymphoma in 2000. The pt notes that the involvement was limited to only one testicle. He received 6 intrathecal treatments in his spine, and received 4 cycles of chemotherapy (thinks this was CHOP) and radiation as well. The pt was followed by an oncologist for several years at the [name] in [place] after moving from [name] after his [place] treatment.   The pt notes that he was first diagnosed with melanoma in October 2000. He noticed a strange spot on his left leg that was surgically resected. The pt's personal notes report a 2.9cm thick, level 4, no LN  involvement, high risk stage II, treated with Interferon three times a week for one year. He notes some squamous cell and basal cell involvements as well and sees a dermatologist, Dr. Wilhemina [name], regularly.   Of note prior to the patient's visit today, pt has had MRI Left Knee completed on 04/09/18 with results revealing An ovoid, lobulated, 41m T2 hyperintense focus within the medial subcutaneous far 1.985mdistal to the joint demonstrates demonstrates nonspecific imaging characteristics.   Most recent lab results (03/01/18) of CBC w/diff is as follows: all values are WNL except for RBC at 4.54, MCV at 100.4, MCH at 33.8.   On review of systems, pt reports left knee pain, mild constipation, and denies new or concerning symptoms, noticing any new lumps or bumps, pain along the spine, abdominal pains, problems passing urine, testicular pain/swelling, and any other symptoms.   INTERVAL HISTORY:   WiWILDER KUROWSKIeturns today for management and evaluation evaluation of Left Primary Testicular Large B-cell Lymphoma. The patient's last visit with [place] on 05/20/2021   Patient notes he is doing well and notes no acute new symptoms.  His respiratory status has progressively improved as he heals from his previous severe COVID infection.  He notes that his oxygen levels have remained about 90% even with ambulation.  [facility] some dyspnea on exertion and does use oxygen.  He has been following with  Dr. Vaughan [name] from pulmonary for continued optimization of his lung function.  Patient notes no fevers no chills no night sweats no new unexpected weight loss.  Improving exercise capacity.  Lab results today 08/19/2021 CBC within normal limits CMP unremarkable LDH 131    MEDICAL HISTORY:  Past Medical History:  Diagnosis Date   Age-related macular degeneration, dry, both eyes    B-cell lymphoma [name]) oncologist--- dr [name]   dx 2000 primary  right testicular diffuse large b-cell lymphoma;  s/p  right orchiectomy 2000, completed chemo, intrathcal chemo (spine injection's), and radiation to left testis in 2000   Carotid stenosis, asymptomatic, left ?   ED (erectile dysfunction)    Elevated diaphragm    right   Erectile dysfunction    History of basal cell carcinoma (BCC) excision    multiple excision in office   History of kidney stones    History of seizure    03-03-2020 per pt x1 while taking interferan for melanoma in 2001 ,  none since, told a side effect of interferan   History of squamous cell carcinoma excision    multiple skin excision in office   Hyperlipidemia    Hypertension    followed by pcp  (03-03-2020 per pt had a stress test approx. 2010, told normal)   LBBB (left bundle branch block)    Lymphoma of testis ([place]) 2000   s/p right orchiectomy    Mass of left testis    MGUS (monoclonal gammopathy of unknown significance)    oncologist--- dr [name]   Mixed hyperlipidemia 10/26/2020   Personal history of malignant melanoma of skin dermatologist--- dr [name]   dx 2001 left calf area  s/p WLE ,  completed one year interferan (03-03-2020 pt states did not involve lymph nodes, no recurrence)   Urgency of urination    Wears hearing aid in both ears     SURGICAL HISTORY: Past Surgical History:  Procedure Laterality Date   CATARACT EXTRACTION W/ INTRAOCULAR LENS IMPLANT Bilateral 2018   IR IMAGING GUIDED PORT INSERTION  03/31/2020   MELANOMA EXCISION  10/1999   ORCHIECTOMY Right 2000   ORCHIECTOMY Left 03/05/2020   Procedure: ORCHIECTOMY;  Surgeon: [name], MD;  Location: [name];  Service: Urology;  Laterality: Left;   TONSILLECTOMY  child   TOTAL KNEE ARTHROPLASTY Right 06/2009    SOCIAL HISTORY: Social History   Socioeconomic History   Marital status: Married    Spouse name: Not on file   Number of children: Not on file   Years of education: Not on file   Highest education  level: Not on file  Occupational History   Not on file  Tobacco Use   Smoking status: Never   Smokeless tobacco: Never  Vaping Use   Vaping Use: Never used  Substance and Sexual Activity   Alcohol use: Yes    Alcohol/week: 14.0 standard drinks    Types: 14 Glasses of wine per week    Comment: 2 wine daily   Drug use: Never   Sexual activity: Not on file  Other Topics Concern   Not on file  Social History Narrative   Social History      Diet?good      Do you drink/eat things with caffeine? seldom      Marital status?          yes  What year were you married? 1962      Do you live in a house, apartment, assisted living, condo, trailer, etc.? house      Is it one or more stories?1      How many persons live in your home? 2      Do you have any pets in your home? (please list) no      Highest level of education completed? college      Current or past profession: president and [employer]      Do you exercise?   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 yes      Do you have a DNR form?       yes                            If not, do you want to discuss one?      Do you have signed POA/HPOA for forms?  yes      Functional Status      Do you have difficulty bathing or dressing yourself? no      Do you have difficulty preparing food or eating? no      Do you have difficulty managing your medications? no      Do you have difficulty managing your finances? no      Do you have difficulty affording your medications? no   Social Determinants of Health   Financial Resource Strain: Not on file  Food Insecurity: Not on file  Transportation Needs: Not on file  Physical Activity: Not on file  Stress: Not on file  Social Connections: Not on file  Intimate Partner Violence: Not on file    FAMILY HISTORY: Family History  Problem Relation Age of  Onset   Cataracts Mother    Transient ischemic attack Mother    Asthma Mother    Cataracts Father    Diabetes Father        borderline   Hypertension Father    Hyperlipidemia Father    Hyperlipidemia Son    Hypertension Son    Amblyopia Neg Hx    Blindness Neg Hx    Glaucoma Neg Hx    Macular degeneration Neg Hx    Retinal detachment Neg Hx    Strabismus Neg Hx    Retinitis pigmentosa Neg Hx     ALLERGIES:  is allergic to latex.  MEDICATIONS:  Current Outpatient Medications  Medication Sig Dispense Refill   albuterol (PROVENTIL) (2.5 MG/3ML) 0.083% nebulizer solution Take 3 mLs (2.5 mg total) by nebulization every 4 (four) hours as needed for shortness of breath. 75 mL 12   albuterol (VENTOLIN HFA) 108 (90 Base) MCG/ACT inhaler Inhale 2 puffs into the lungs every 6 (six) hours as needed for shortness of breath. 8 g 2   Black Pepper-Turmeric (TURMERIC COMPLEX/BLACK PEPPER PO) Take 1,000 mg by mouth daily at 12 noon.     carboxymethylcellulose (REFRESH PLUS) 0.5 % SOLN Place 1 drop into both eyes daily as needed.     Cholecalciferol 50 MCG (2000 Antonio) CAPS Take 1 capsule by mouth 2 (two) times daily.  fluticasone (FLONASE) 50 MCG/ACT nasal spray Place 2 sprays into both nostrils daily. 11.1 mL 5   losartan (COZAAR) 100 MG tablet TAKE 1 TABLET BY MOUTH EVERY DAY 90 tablet 1   meloxicam (MOBIC) 7.5 MG tablet Take 7.5 mg by mouth daily.     Multiple Vitamins-Minerals (PRESERVISION AREDS 2 PO) Take 1 capsule by mouth 2 (two) times daily.      propranolol (INDERAL) 10 MG tablet Take 10 mg by mouth 3 (three) times daily as needed.     simvastatin (ZOCOR) 40 MG tablet Take 1 tablet (40 mg total) by mouth daily. 90 tablet 1   Testosterone 20.25 MG/ACT (1.62%) GEL Apply 40.5 mg topically daily.     valACYclovir (VALTREX) 1000 MG tablet Take 1000 (1 grams) PO 2 times daily for 3 days as needed for flares (herpes)     vitamin B-12 (CYANOCOBALAMIN) 1000 MCG tablet Take 1,000 mcg by mouth  daily.     No current facility-administered medications for this visit.    REVIEW OF SYSTEMS:   .10 Point review of Systems was done is negative except as noted above.   PHYSICAL EXAMINATION: ECOG PERFORMANCE STATUS: 0 - Asymptomatic VS reviewed - stable  Vitals:   08/19/21 0958  BP: (!) 143/77  Pulse: 65  Resp: 18  Temp: 97.9 F (36.6 C)  SpO2: 96%    Wt Readings from Last 3 Encounters:  08/19/21 187 lb 11.2 oz (85.1 kg)  08/13/21 182 lb (82.6 kg)  07/02/21 183 lb 3.2 oz (83.1 kg)   Body mass index is 25.46 kg/m.    Marland Kitchen GENERAL:alert, in no acute distress and comfortable SKIN: no acute rashes, no significant lesions EYES: conjunctiva are pink and non-injected, sclera anicteric OROPHARYNX: MMM, no exudates, no oropharyngeal erythema or ulceration NECK: supple, no JVD LYMPH:  no palpable lymphadenopathy in the cervical, axillary or inguinal regions LUNGS: clear to auscultation b/l with normal respiratory effort HEART: regular rate & rhythm ABDOMEN:  normoactive bowel sounds , non tender, not distended. Extremity: no pedal edema PSYCH: alert & oriented x 3 with fluent speech NEURO: no focal motor/sensory deficits   LABORATORY DATA:  I have reviewed the data as listed  . CBC Latest Ref Rng & Units 08/19/2021 06/21/2021 06/18/2021  WBC 4.0 - 10.5 K/uL 6.0 16.7(H) 12.5(H)  Hemoglobin 13.0 - 17.0 g/dL 14.9 14.5 12.5(L)  Hematocrit 39.0 - 52.0 % 44.5 45.6 39.9  Platelets 150 - 400 K/uL 189 304 227   . CBC    Component Value Date/Time   WBC 6.0 08/19/2021 0942   RBC 4.40 08/19/2021 0942   HGB 14.9 08/19/2021 0942   HGB 12.7 (L) 11/19/2020 1323   HCT 44.5 08/19/2021 0942   HCT 40.8 12/19/2019 1057   PLT 189 08/19/2021 0942   PLT 170 11/19/2020 1323   MCV 101.1 (H) 08/19/2021 0942   MCH 33.9 08/19/2021 0942   MCHC 33.5 08/19/2021 0942   RDW 13.6 08/19/2021 0942   LYMPHSABS 1.2 08/19/2021 0942   MONOABS 0.6 08/19/2021 0942   EOSABS 0.1 08/19/2021 0942    BASOSABS 0.0 08/19/2021 0942    . CMP Latest Ref Rng & Units 08/19/2021 06/28/2021 06/21/2021  Glucose 70 - 99 mg/dL 89 - 101(H)  BUN 8 - 23 mg/dL '17 15 16  ' Creatinine 0.61 - 1.24 mg/dL 0.80 0.8 0.66  Sodium 135 - 145 mmol/L 142 146 142  Potassium 3.5 - 5.1 mmol/L 4.1 4.4 4.2  Chloride 98 - 111 mmol/L 105 101 100  CO2 22 - 32 mmol/L 30 28(A) 35(H)  Calcium 8.9 - 10.3 mg/dL 9.0 8.8 8.4(L)  Total Protein 6.5 - 8.1 g/dL 6.6 - -  Total Bilirubin 0.3 - 1.2 mg/dL 1.0 - -  Alkaline Phos 38 - 126 U/L 57 - -  AST 15 - 41 U/L 15 - -  ALT 0 - 44 U/L 14 - -   03/05/2020 Left Testicular Flow Pathology Report [id number]):    03/05/2020 FISH Panel:    03/05/2020 Left Testicular Mass Surgical Pathology ([id number]):    Component     Latest Ref Rng & Units 12/19/2019  Folate, Hemolysate     Not Estab. ng/mL 390.0  HCT     37.5 - 51.0 % 40.8  Folate, RBC     >498 ng/mL 956  Vitamin B12     180 - 914 pg/mL 882   03/01/18 CBC w/diff:   RADIOGRAPHIC STUDIES: I have personally reviewed the radiological images as listed and agreed with the findings in the report. No results found.  03/05/20 of Surgical Pathology ([id number]) SURGICAL PATHOLOGY  CASE: [id number]  PATIENT: [name]  Surgical Pathology Report      Clinical History: Left testicular mass (jmc)      FINAL MICROSCOPIC DIAGNOSIS:   A. TESTICLE AND CORD, LEFT, ORCHIECTOMY:  - Diffuse large B-cell lymphoma  -See comment    COMMENT:   The sections of the testis and spermatic cord nodules show effacement of  the architecture by an atypical lymphoid infiltrate characterized by  predominance of medium and large lymphoid cells with vesicular chromatin  and small nucleoli associated with brisk mitosis.  The appearance is  primarily diffuse with lack of atypical follicles.  Variable number of  admixed smaller lymphocytes are seen. To further evaluate this process,  flow cytometric analysis was performed  ([id number]) and shows a  monoclonal kappa-restricted B-cell population.  In addition, a battery  of immunohistochemical stains was performed and shows that the atypical  lymphoid cells are positive for CD20, CD79a, PAX 5, CD10 (weak), BCL-2,  MUM-1 and cytoplasmic kappa.  No significant staining is seen with  CD138, cyclin D1, CD30, CD34, TdT, EBV in situ hybridization or  cytoplasmic lambda.  Ki67 shows variable expression ranging from 10% to  >50%.  There is an admixed T-cell population to a lesser extent as seen  with CD3 and CD5 and there is no apparent co-expression of CD5 in B-cell  areas.  The [name] features are consistent with involvement by diffuse  large B-cell lymphoma, GCB type.  The results were discussed with Dr.  Jeffie Pollock and [name] on 03/11/2020.    ASSESSMENT & PLAN:   80 y.o. male with  H/o Rt Primary testicular large B cell lymphoma in 2000 treatment with R-CHOP x 6 cycles as part of clinical trial + IT MTX x 4 cycles and RT to left testicle  2 . H/o Relapsed Left Primary testicular large B cell lymphoma 2021 stage I/IIE. Appears to be a new event since it is occurring 20 yrs after his previous rt testicular lymphoma S/p 6 cycles of R-CEOP -- currently in remission.   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 PLAN: -Discussed pt labwork today, 08/19/2021 ; cbc wnl, cmp unremarkable. -LDH 131 - wnl -No lab or  clinical evidence of Large B-cell Lymphoma recurrence at this time.  -f/u with PCP for age and medical co-morbids appropriate vaccinations. -continue f/u with Pulmonary for covid lung mx  FOLLOW UP: RTC with Dr [name] with labs in 4 months  . The total time spent in the appointment was 20 minutes and more than 50% was on counseling and direct  patient cares.   All of the patient's questions were answered with apparent satisfaction. The patient knows to call the clinic with any problems, questions or concerns.    Sullivan Lone MD [place] AAHIVMS [name] Hematology/Oncology Physician [name]

## 2021-08-27 ENCOUNTER — Other Ambulatory Visit: Payer: Self-pay | Admitting: Adult Health

## 2021-08-27 DIAGNOSIS — U071 COVID-19: Secondary | ICD-10-CM

## 2021-08-27 MED ORDER — ALBUTEROL SULFATE HFA 108 (90 BASE) MCG/ACT IN AERS: 2.0000 | INHALATION_SPRAY | Freq: Four times a day (QID) | RESPIRATORY_TRACT | 11 refills | Status: DC | PRN

## 2021-08-30 ENCOUNTER — Other Ambulatory Visit: Payer: Self-pay | Admitting: *Deleted

## 2021-08-30 DIAGNOSIS — U071 COVID-19: Secondary | ICD-10-CM

## 2021-08-30 MED ORDER — ALBUTEROL SULFATE HFA 108 (90 BASE) MCG/ACT IN AERS: 2.0000 | INHALATION_SPRAY | Freq: Four times a day (QID) | RESPIRATORY_TRACT | 1 refills | Status: DC | PRN

## 2021-08-30 NOTE — Telephone Encounter (Signed)
Patient called requesting a 90 day supply of his Albuterol for cheaper [employer].  Pended Rx and sent to [facility] due to [place].

## 2021-08-31 NOTE — Progress Notes (Signed)
[facility] Note  09/03/2021     CHIEF COMPLAINT Patient presents for Retina Follow Up   HISTORY OF PRESENT ILLNESS: Antonio [name] is a 80 y.o. male who presents to the clinic today for:   HPI     Retina Follow Up   Patient presents with  Dry AMD.  In both eyes.  Duration of 9 months.  Since onset it is stable.  I, the attending physician,  performed the HPI with the patient and updated documentation appropriately.        Comments   9 month follow up dry ARMD OU- Vision appears stable since last visit OU.       Last edited by Bernarda Caffey, MD on 09/04/2021 12:23 AM.     pt states he contracted covid twice in 6 months, both resulted in hospital stays, he is not on oxygen [place], he has an inhaler that he uses as needed at home, he states he still has lymphoma as well, eyes seem to be doing okay  Referring physician: [name], DO [place],  [place] [address]  HISTORICAL INFORMATION:   Selected notes from the [facility] for ARMD OU; Moved from [place], [place]  Ocular Hx - nonexudative ARMD OU; PVD OU; pseudophakia OU (Toric OU - 12/2016 by Dr. Nance Pear, [place], [facility]);  PMH - HTN; [facility] - ARMD - Mother   CURRENT MEDICATIONS: Current Outpatient Medications (Ophthalmic Drugs)  Medication Sig   carboxymethylcellulose (REFRESH PLUS) 0.5 % SOLN Place 1 drop into both eyes daily as needed.   No current facility-administered medications for this visit. (Ophthalmic Drugs)   Current Outpatient Medications (Other)  Medication Sig   albuterol (PROVENTIL) (2.5 MG/3ML) 0.083% nebulizer solution Take 3 mLs (2.5 mg total) by nebulization every 4 (four) hours as needed for shortness of breath.   Black Pepper-Turmeric (TURMERIC COMPLEX/BLACK PEPPER PO) Take 1,000 mg by mouth daily at 12 noon.   Cholecalciferol 50 MCG (2000 UT) CAPS Take 1 capsule by mouth 2 (two) times daily.    fluticasone (FLONASE) 50 MCG/ACT nasal spray Place 2 sprays into both nostrils daily.   losartan (COZAAR) 100 MG tablet TAKE 1 TABLET BY MOUTH EVERY DAY   meloxicam (MOBIC) 7.5 MG tablet Take 7.5 mg by mouth daily.   Multiple Vitamins-Minerals (PRESERVISION AREDS 2 PO) Take 1 capsule by mouth 2 (two) times daily.    propranolol (INDERAL) 10 MG tablet Take 10 mg by mouth 3 (three) times daily as needed.   simvastatin (ZOCOR) 40 MG tablet Take 1 tablet (40 mg total) by mouth daily.   Testosterone 20.25 MG/ACT (1.62%) GEL Apply 40.5 mg topically daily.   valACYclovir (VALTREX) 1000 MG tablet Take 1000 (1 grams) PO 2 times daily for 3 days as needed for flares (herpes)   vitamin B-12 (CYANOCOBALAMIN) 1000 MCG tablet Take 1,000 mcg by mouth daily.   albuterol (VENTOLIN HFA) 108 (90 Base) MCG/ACT inhaler Inhale 2 puffs into the lungs every 6 (six) hours as needed for shortness of breath.   No current facility-administered medications for this visit. (Other)   REVIEW OF SYSTEMS: ROS   Positive for: Neurological, Genitourinary, Musculoskeletal, Endocrine, Cardiovascular, Eyes, Heme/Lymph Negative for: Constitutional, Gastrointestinal, Skin, HENT, Respiratory, Psychiatric, Allergic/Imm Last edited by Leonie Douglas, COA on 09/03/2021  1:22 PM.     ALLERGIES Allergies  Allergen Reactions   Latex Itching    Latex tape causes itching  and redness.    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   ED (erectile dysfunction)    Elevated diaphragm    right   Erectile dysfunction    History of basal cell carcinoma (BCC) excision    multiple excision in office    History of kidney stones    History of seizure    03-03-2020 per pt x1 while taking interferan for melanoma in 2001 ,  none since, told a side effect of interferan   History of squamous cell carcinoma excision    multiple skin excision in office   Hyperlipidemia    Hypertension    followed by pcp  (03-03-2020 per pt had a stress test approx. 2010, told normal)   LBBB (left bundle branch block)    Lymphoma of testis ([place]) 2000   s/p right orchiectomy    Mass of left testis    MGUS (monoclonal gammopathy of unknown significance)    oncologist--- dr Irene Limbo   Mixed hyperlipidemia 10/26/2020   Personal history of malignant melanoma of skin dermatologist--- dr Wilhemina Bonito   dx 2001 left calf area  s/p WLE ,  completed one year interferan (03-03-2020 pt states did not involve lymph nodes, no recurrence)   Urgency of urination    Wears hearing aid in both ears    Past Surgical History:  Procedure Laterality Date   CATARACT EXTRACTION W/ INTRAOCULAR LENS IMPLANT Bilateral 2018   IR IMAGING GUIDED PORT INSERTION  03/31/2020   MELANOMA EXCISION  10/1999   ORCHIECTOMY Right 2000   ORCHIECTOMY Left 03/05/2020   Procedure: ORCHIECTOMY;  Surgeon: [name], MD;  Location: [facility];  Service: Urology;  Laterality: Left;   TONSILLECTOMY  child   TOTAL KNEE ARTHROPLASTY Right 06/2009    FAMILY HISTORY Family History  Problem Relation Age of Onset   Cataracts Mother    Transient ischemic attack Mother    Asthma Mother    Cataracts Father    Diabetes Father        borderline   Hypertension Father    Hyperlipidemia Father    Hyperlipidemia Son    Hypertension Son    Amblyopia Neg Hx    Blindness Neg Hx    Glaucoma Neg Hx    Macular degeneration Neg Hx    Retinal detachment Neg Hx    Strabismus Neg Hx    Retinitis pigmentosa Neg Hx     SOCIAL HISTORY Social History   Tobacco Use   Smoking status: Never   Smokeless tobacco: Never  Vaping Use   Vaping Use: Never  used  Substance Use Topics   Alcohol use: Yes    Alcohol/week: 14.0 standard drinks    Types: 14 Glasses of wine per week    Comment: 2 wine daily   Drug use: Never       OPHTHALMIC EXAM: Base Eye Exam     Visual Acuity (Snellen - Linear)       Right Left   Dist cc 20/20 20/20    Correction: Glasses         Tonometry (Tonopen, 1:27 PM)       Right Left  Pressure 14 14         Pupils       Dark Light Shape React APD   Right 3 2 Round Brisk None   Left 3 2 Round Brisk None         Visual Fields (Counting fingers)       Left Right    Full Full         Extraocular Movement       Right Left    Full Full         Neuro/Psych     Oriented x3: Yes   Mood/Affect: Normal         Dilation     Both eyes: 1.0% Mydriacyl, 2.5% Phenylephrine @ 1:27 PM           Slit Lamp and Fundus Exam     External Exam       Right Left   External Brow ptosis - mild Brow ptosis - mild         Slit Lamp Exam       Right Left   Lids/Lashes Dermatochalasis - upper lid Dermatochalasis - upper lid   Conjunctiva/Sclera mild inferior Conjunctivochalasis mild inferior Conjunctivochalasis, nasal Pinguecula   Cornea Arcus, trace Punctate epithelial erosions, Well healed temporal cataract wounds Arcus, Well healed cataract wounds, trace Punctate epithelial erosions   Anterior Chamber Deep and quiet Deep and quiet   Iris Round with moderate dilated to 68mm Round with moderate dilated to 50mm   Lens toric Posterior chamber intraocular lens in good postion with marks at 0300 and 0900, trace Posterior capsular opacification Toric Posterior chamber intraocular lens in good position with marks at 0200 and 0800; trace Posterior capsular opacification   Anterior Vitreous Vitreous syneresis, Posterior vitreous detachment, vitreous condensations Vitreous syneresis, Posterior vitreous detachment, vitreous condensations         Fundus Exam       Right Left   Disc Mild  pallor, sharp rim, Compact, Tilted with Peripapillary atrophy 360 compact, mild tilt, Peripapillary atrophy 360, mild Pallor, Sharp rim   C/D Ratio 0.4 0.3   Macula Flat, Blunted foveal reflex, Drusen, RPE mottling, clumping and atrophy - stable, mild, No heme or edema, focal atrophy Good foveal reflex, Drusen, RPE mottling, clumping and atrophy - stable, mild, No heme or edema, focal atrophy   Vessels attenuated, mild tortuousity mild attenuation, mild tortuousity   Periphery Attached; scattered Reticular degeneration, No heme Attached; scattered Reticular degeneration, No heme           Refraction     Wearing Rx       Sphere Cylinder Axis Add   Right -0.75 +0.50 106 +2.50   Left -0.50 +0.75 121 +2.50    Type: PAL            IMAGING AND PROCEDURES  Imaging and Procedures for 11/10/17  OCT, Retina - OU - Both Eyes       Right Eye Quality was good. Central Foveal Thickness: 322. Progression has been stable. Findings include normal foveal contour, no IRF, no SRF, retinal drusen , outer retinal atrophy, myopic contour (Stable focal areas of outer retinal atrophy / ellipsoid dropout; scattered drusen).   Left Eye Quality was good. Central Foveal Thickness: 325. Progression has been stable. Findings include normal foveal contour, no IRF, no SRF, retinal drusen , outer retinal atrophy, myopic contour (Scattered drusen; focal ORA IT to fovea).   Notes Images captured and stored on drive  Diagnosis /  Impression:  Nonexudative ARMD OU - no significant change from prior  Clinical management:  See below  Abbreviations: NFP - Normal foveal profile. CME - cystoid macular edema. PED - pigment epithelial detachment. IRF - intraretinal fluid. SRF - subretinal fluid. EZ - ellipsoid zone. ERM - epiretinal membrane. ORA - outer retinal atrophy. ORT - outer retinal tubulation. SRHM - subretinal hyper-reflective material                ASSESSMENT/PLAN:    ICD-10-CM   1.  Intermediate stage nonexudative age-related macular degeneration of both eyes  H35.3132     2. Retinal edema  H35.81 OCT, Retina - OU - Both Eyes    3. Pseudophakia of both eyes  Z96.1     4. 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 Bilateral posterior capsular opacification  H26.493      1,2. Age related macular degeneration, non-exudative, both eyes   - intermediate stage -- stable  - The incidence, anatomy, and pathology of dry AMD, risk of progression, and the AREDS and AREDS 2 study including smoking risks discussed with patient.   - no longer using Foresee AMD monitoring device due to insurance issues  - no significant progression on exam or OCT -- scattered drusen and ORA  - f/u 9 months, sooner PRN -- DFE/OCT  3. Pseudophakia OU  - s/p CE/PCIOL OU (Toric + femto) 12/2016 by Dr. Nance Pear in [facility]  - had post op IOP issue OS  - beautiful surgery, doing well  - monitor   4. H/o ocular hypertension OU  - IOP good today (13,14) off combigan  - monitor   5. PCO OU  - not yet visually significant  - monitor for now  Ophthalmic Meds Ordered this visit:  No orders of the defined types were placed in this encounter.      Return in about 9 months (around 06/03/2022) for f/u non-exu ARMD OU, DFE, OCT.  There are no Patient Instructions on file for this visit.  This document serves as a record of services personally performed by Gardiner Sleeper, MD, PhD. It was created on their behalf by Leonie Douglas, an ophthalmic technician. The creation of this record is the provider's dictation and/or activities during the visit.    Electronically signed by: Leonie Douglas COA, 09/04/21  12:24 AM  This document serves as a record of services personally performed by Gardiner Sleeper, MD, PhD. It was created on their behalf by San Jetty. Owens Shark, OA an ophthalmic technician. The creation of this record is the provider's dictation and/or activities during the visit.    Electronically  signed by: San Jetty. Marguerita Merles 11.18.2022 12:24 AM  Gardiner Sleeper, M.D., Ph.D. Diseases & Surgery of the Retina and [place] 09/03/2021  I have reviewed the above documentation for accuracy and completeness, and I agree with the above. Gardiner Sleeper, M.D., Ph.D. 09/04/21 12:25 AM  Abbreviations: M myopia (nearsighted); A astigmatism; H hyperopia (farsighted); P presbyopia; Mrx spectacle prescription;  CTL contact lenses; OD right eye; OS left eye; OU both eyes  XT exotropia; ET esotropia; PEK punctate epithelial keratitis; PEE punctate epithelial erosions; DES dry eye syndrome; MGD meibomian gland dysfunction; ATs artificial tears; PFAT's preservative free artificial tears; [place] nuclear sclerotic cataract; PSC posterior subcapsular cataract; ERM epi-retinal membrane; PVD posterior vitreous detachment; RD retinal detachment; DM diabetes mellitus; DR diabetic retinopathy; NPDR non-proliferative diabetic retinopathy; PDR proliferative diabetic retinopathy; CSME clinically  significant macular edema; DME diabetic macular edema; dbh dot blot hemorrhages; CWS cotton wool spot; POAG primary open angle glaucoma; C/D cup-to-disc ratio; HVF humphrey visual field; GVF goldmann visual field; OCT optical coherence tomography; IOP intraocular pressure; BRVO Branch retinal vein occlusion; CRVO central retinal vein occlusion; CRAO central retinal artery occlusion; BRAO branch retinal artery occlusion; RT retinal tear; SB scleral buckle; PPV pars plana vitrectomy; VH Vitreous hemorrhage; PRP panretinal laser photocoagulation; IVK intravitreal kenalog; VMT vitreomacular traction; MH Macular hole;  NVD neovascularization of the disc; NVE neovascularization elsewhere; AREDS age related eye disease study; ARMD age related macular degeneration; POAG primary open angle glaucoma; EBMD epithelial/anterior basement membrane dystrophy; ACIOL anterior chamber intraocular lens; IOL intraocular  lens; PCIOL posterior chamber intraocular lens; Phaco/IOL phacoemulsification with intraocular lens placement; [place] photorefractive keratectomy; LASIK laser assisted in situ keratomileusis; HTN hypertension; DM diabetes mellitus; COPD chronic obstructive pulmonary disease

## 2021-09-03 ENCOUNTER — Other Ambulatory Visit: Payer: Self-pay | Admitting: *Deleted

## 2021-09-03 ENCOUNTER — Encounter (INDEPENDENT_AMBULATORY_CARE_PROVIDER_SITE_OTHER): Payer: Self-pay | Admitting: Ophthalmology

## 2021-09-03 ENCOUNTER — Ambulatory Visit (INDEPENDENT_AMBULATORY_CARE_PROVIDER_SITE_OTHER): Payer: PPO | Admitting: Ophthalmology

## 2021-09-03 ENCOUNTER — Other Ambulatory Visit: Payer: Self-pay

## 2021-09-03 DIAGNOSIS — H353132 Nonexudative age-related macular degeneration, bilateral, intermediate dry stage: Secondary | ICD-10-CM | POA: Diagnosis not present

## 2021-09-03 DIAGNOSIS — H40053 Ocular hypertension, bilateral: Secondary | ICD-10-CM | POA: Diagnosis not present

## 2021-09-03 DIAGNOSIS — Z961 Presence of intraocular lens: Secondary | ICD-10-CM | POA: Diagnosis not present

## 2021-09-03 DIAGNOSIS — H3581 Retinal edema: Secondary | ICD-10-CM

## 2021-09-03 DIAGNOSIS — H26493 Other secondary cataract, bilateral: Secondary | ICD-10-CM

## 2021-09-03 DIAGNOSIS — U071 COVID-19: Secondary | ICD-10-CM

## 2021-09-03 MED ORDER — ALBUTEROL SULFATE HFA 108 (90 BASE) MCG/ACT IN AERS: 2.0000 | INHALATION_SPRAY | Freq: Four times a day (QID) | RESPIRATORY_TRACT | 1 refills | Status: DC | PRN

## 2021-09-03 NOTE — Telephone Encounter (Signed)
Patient stopped in office and stated that the pharmacy never received the Albuterol inhaler Rx for 90 days.   Pended Rx and sent to [facility] for approval due to [place].

## 2021-09-04 ENCOUNTER — Encounter (INDEPENDENT_AMBULATORY_CARE_PROVIDER_SITE_OTHER): Payer: Self-pay | Admitting: Ophthalmology

## 2021-09-06 ENCOUNTER — Non-Acute Institutional Stay: Payer: PPO | Admitting: Adult Health

## 2021-09-06 ENCOUNTER — Other Ambulatory Visit: Payer: Self-pay

## 2021-09-06 ENCOUNTER — Encounter: Payer: Self-pay | Admitting: Adult Health

## 2021-09-06 VITALS — BP 144/92 | HR 77 | Temp 96.5°F | Ht 72.0 in | Wt 188.6 lb

## 2021-09-06 DIAGNOSIS — I10 Essential (primary) hypertension: Secondary | ICD-10-CM | POA: Diagnosis not present

## 2021-09-06 DIAGNOSIS — C8339 Diffuse large B-cell lymphoma, extranodal and solid organ sites: Secondary | ICD-10-CM

## 2021-09-06 DIAGNOSIS — E559 Vitamin D deficiency, unspecified: Secondary | ICD-10-CM | POA: Diagnosis not present

## 2021-09-06 DIAGNOSIS — U099 Post covid-19 condition, unspecified: Secondary | ICD-10-CM | POA: Diagnosis not present

## 2021-09-06 DIAGNOSIS — H6123 Impacted cerumen, bilateral: Secondary | ICD-10-CM | POA: Diagnosis not present

## 2021-09-06 DIAGNOSIS — R0609 Other forms of dyspnea: Secondary | ICD-10-CM | POA: Diagnosis not present

## 2021-09-06 DIAGNOSIS — E782 Mixed hyperlipidemia: Secondary | ICD-10-CM | POA: Diagnosis not present

## 2021-09-06 DIAGNOSIS — M5030 Other cervical disc degeneration, unspecified cervical region: Secondary | ICD-10-CM

## 2021-09-06 NOTE — Progress Notes (Signed)
Location: [facility]  POS: clinic  Provider:  Cindi Carbon, [place] [phone]   Code Status:  Goals of Care:  Advanced Directives 09/06/2021  Does Patient Have a Medical Advance Directive? Yes  Type of Advance Directive Out of facility DNR (pink MOST or yellow form)  Does patient want to make changes to medical advance directive? No - Patient declined  Copy of [place] in Chart? -  Pre-existing out of facility DNR order (yellow form or pink MOST form) Yellow form placed in chart (order not valid for inpatient use);Pink MOST form placed in chart (order not valid for inpatient use)     Chief Complaint  Patient presents with   Medical Management of Chronic Issues    Patient returns to the clinic for his 6 month follow up.     HPI: Patient is a 80 y.o. male seen today for medical management of chronic diseases.    Has recovered from multifocal pna in Sept with hospitalization and bipap required. Continues to watch 02 levels at home which have been WNL. No need for oxygen. [place] and [place] oxygen with him. Denies any cough or sob. Uses albuterol twice a day on a schedule even though he denies symptoms. He thought it was supposed to be scheduled. He has been asking for frequent refills due to this he says.  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   Hx of B cell lymphoma of testes x2  followed by Dr. Irene Limbo in remission s/p right orchiectomy, chemo , and radiation.  Has a significant Covid infection with prolonged hospitalization in Jan of 2022.   BP 144 92 denies any issue with swelling or chest pain. Take inderal for hx of tremors and also tachycardia.   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   ED (erectile dysfunction)    Elevated diaphragm    right   Erectile dysfunction    History of basal cell carcinoma (BCC) excision    multiple excision in office   History of kidney stones    History of seizure    03-03-2020 per pt x1 while taking interferan for melanoma in 2001 ,  none since, told a side effect of interferan   History of squamous cell carcinoma excision    multiple skin excision in office   Hyperlipidemia    Hypertension    followed by pcp  (03-03-2020 per pt had a stress test approx. 2010, told normal)   LBBB (left bundle branch block)    Lymphoma of testis ([place]) 2000   s/p right orchiectomy    Mass of left testis    MGUS (monoclonal gammopathy of unknown significance)    oncologist--- dr Irene Limbo   Mixed hyperlipidemia 10/26/2020   Personal history of malignant melanoma of skin dermatologist--- dr Wilhemina Bonito   dx 2001 left calf area  s/p WLE ,  completed one year interferan (03-03-2020 pt states did not involve lymph nodes, no recurrence)   Urgency of urination    Wears hearing aid  in both ears     Past Surgical History:  Procedure Laterality Date   CATARACT EXTRACTION W/ INTRAOCULAR LENS IMPLANT Bilateral 2018   IR IMAGING GUIDED PORT INSERTION  03/31/2020   MELANOMA EXCISION  10/1999   ORCHIECTOMY Right 2000   ORCHIECTOMY Left 03/05/2020   Procedure: ORCHIECTOMY;  Surgeon: [name], MD;  Location: [facility];  Service: Urology;  Laterality: Left;   TONSILLECTOMY  child   TOTAL KNEE ARTHROPLASTY Right 06/2009    Allergies  Allergen Reactions   Latex Itching    Latex tape causes itching and redness.    Outpatient Encounter Medications as of 09/06/2021  Medication Sig   albuterol (PROVENTIL) (2.5 MG/3ML) 0.083% nebulizer solution Take 3  mLs (2.5 mg total) by nebulization every 4 (four) hours as needed for shortness of breath.   albuterol (VENTOLIN HFA) 108 (90 Base) MCG/ACT inhaler Inhale 2 puffs into the lungs every 6 (six) hours as needed for shortness of breath.   Black Pepper-Turmeric (TURMERIC COMPLEX/BLACK PEPPER PO) Take 1,000 mg by mouth daily at 12 noon.   carboxymethylcellulose (REFRESH PLUS) 0.5 % SOLN Place 1 drop into both eyes daily as needed.   Cholecalciferol 50 MCG (2000 UT) CAPS Take 1 capsule by mouth 2 (two) times daily.   fluticasone (FLONASE) 50 MCG/ACT nasal spray Place 2 sprays into both nostrils daily.   losartan (COZAAR) 100 MG tablet TAKE 1 TABLET BY MOUTH EVERY DAY   meloxicam (MOBIC) 7.5 MG tablet Take 7.5 mg by mouth daily.   Multiple Vitamins-Minerals (PRESERVISION AREDS 2 PO) Take 1 capsule by mouth 2 (two) times daily.    propranolol (INDERAL) 10 MG tablet Take 10 mg by mouth 3 (three) times daily as needed.   simvastatin (ZOCOR) 40 MG tablet Take 1 tablet (40 mg total) by mouth daily.   Testosterone 20.25 MG/ACT (1.62%) GEL Apply 40.5 mg topically daily.   valACYclovir (VALTREX) 1000 MG tablet Take 1000 (1 grams) PO 2 times daily for 3 days as needed for flares (herpes)   vitamin B-12 (CYANOCOBALAMIN) 1000 MCG tablet Take 1,000 mcg by mouth daily.   No facility-administered encounter medications on file as of 09/06/2021.    Review of Systems:  Review of Systems  Constitutional:  Negative for activity change, appetite change, chills, diaphoresis, fatigue, fever and unexpected weight change.  HENT:  Negative for congestion.   Respiratory:  Negative for cough, shortness of breath (on exertion), wheezing and stridor.   Cardiovascular:  Negative for chest pain, palpitations and leg swelling.  Gastrointestinal:  Negative for abdominal distention, abdominal pain, constipation and diarrhea.  Genitourinary:  Negative for difficulty urinating and dysuria.  Musculoskeletal:  Positive for arthralgias  and gait problem. Negative for back pain, joint swelling and myalgias.  Neurological:  Negative for dizziness, seizures, syncope, facial asymmetry, speech difficulty, weakness and headaches.  Hematological:  Negative for adenopathy. Does not bruise/bleed easily.  Psychiatric/Behavioral:  Negative for agitation, behavioral problems and confusion.    Health Maintenance  Topic Date Due   COVID-19 Vaccine (4 - Booster for Moderna series) 09/06/2021   TETANUS/TDAP  03/04/2024   Pneumonia Vaccine 15+ Years old  Completed   INFLUENZA VACCINE  Completed   Zoster Vaccines- Shingrix  Completed   HPV VACCINES  Aged Out    Physical Exam: Vitals:   09/06/21 1344  BP: (!) 144/92  Pulse: 77  Temp: (!) 96.5 F (35.8 C)  SpO2: 96%  Weight: 188 lb 10.2 oz (85.6 kg)  Height: 6' (1.829  m)   Body mass index is 25.58 kg/m. Physical Exam Vitals reviewed.  Constitutional:      General: He is not in acute distress.    Appearance: He is not diaphoretic.  HENT:     Head: Normocephalic and atraumatic.     Right Ear: There is impacted cerumen.     Left Ear: There is impacted cerumen.     Mouth/Throat:     Mouth: Mucous membranes are moist.     Pharynx: Oropharynx is clear.  Eyes:     Conjunctiva/sclera: Conjunctivae normal.     Pupils: Pupils are equal, round, and reactive to light.  Neck:     Thyroid: No thyromegaly.     Vascular: No JVD.     Trachea: No tracheal deviation.  Cardiovascular:     Rate and Rhythm: Normal rate and regular rhythm.     Heart sounds: No murmur heard. Pulmonary:     Effort: Pulmonary effort is normal. No respiratory distress.     Breath sounds: Normal breath sounds. No wheezing.  Abdominal:     General: Bowel sounds are normal. There is no distension.     Palpations: Abdomen is soft.     Tenderness: There is no abdominal tenderness.  Musculoskeletal:     Right lower leg: No edema.     Left lower leg: No edema.  Lymphadenopathy:     Cervical: No cervical  adenopathy.  Skin:    General: Skin is warm and dry.  Neurological:     Mental Status: He is alert and oriented to person, place, and time.     Cranial Nerves: No cranial nerve deficit.  Psychiatric:        Mood and Affect: Mood normal.    Labs reviewed: Basic Metabolic Panel: Recent Labs    06/19/21 0506 06/20/21 0304 06/21/21 0319 06/28/21 0000 08/19/21 0942  NA 140 140 142 146 142  K 3.6 3.7 4.2 4.4 4.1  CL 99 96* 100 101 105  CO2 35* 34* 35* 28* 30  GLUCOSE 105* 113* 101*  --  89  BUN 21 17 16 15 17   CREATININE 0.62 0.64 0.66 0.8 0.80  CALCIUM 8.0* 8.2* 8.4* 8.8 9.0  MG 2.1  --  2.0 2.3  --   PHOS 3.0  --  3.4 3.8  --    Liver Function Tests: Recent Labs    06/19/21 0506 06/20/21 0304 08/19/21 0942  AST 21 21 15   ALT 100* 84* 14  ALKPHOS 82 84 57  BILITOT 0.6 0.7 1.0  PROT 5.4* 6.0* 6.6  ALBUMIN 2.7* 3.1* 4.0   No results for input(s): LIPASE, AMYLASE in the last 8760 hours. No results for input(s): AMMONIA in the last 8760 hours. CBC: Recent Labs    06/18/21 0430 06/21/21 0319 08/19/21 0942  WBC 12.5* 16.7* 6.0  NEUTROABS 9.7* 12.3* 4.0  HGB 12.5* 14.5 14.9  HCT 39.9 45.6 44.5  MCV 107.3* 104.1* 101.1*  PLT 227 304 189   Lipid Panel: Recent Labs    10/20/20 1400  TRIG 108   Lab Results  Component Value Date   HGBA1C 5.7 02/27/2020    Procedures since last visit: OCT, Retina - OU - Both Eyes  Result Date: 09/04/2021 Right Eye Quality was good. Central Foveal Thickness: 322. Progression has been stable. Findings include normal foveal contour, no IRF, no SRF, retinal drusen , outer retinal atrophy, myopic contour (Stable focal areas of outer retinal atrophy / ellipsoid dropout; scattered drusen). Left Eye  Quality was good. Central Foveal Thickness: 325. Progression has been stable. Findings include normal foveal contour, no IRF, no SRF, retinal drusen , outer retinal atrophy, myopic contour (Scattered drusen; focal ORA IT to fovea). Notes  Images captured and stored on drive Diagnosis / Impression: Nonexudative ARMD OU - no significant change from prior Clinical management: See below Abbreviations: NFP - Normal foveal profile. CME - cystoid macular edema. PED - pigment epithelial detachment. IRF - intraretinal fluid. SRF - subretinal fluid. EZ - ellipsoid zone. ERM - epiretinal membrane. ORA - outer retinal atrophy. ORT - outer retinal tubulation. SRHM - subretinal hyper-reflective material    Assessment/Plan  1. 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 Vaughan Browner 08/13/21 and recommended to have a have a f/u CT scan in 3 months. Please call and schedule your CT apt and f/u with Dr. Vaughan Browner. Only take albuterol if you feeling sob, wheezing, or having a cough. If you find you need to regularly please let pulmonary know.   2. Bilateral impacted cerumen Obtain debrox ear drops in each ear for 4 days then notify the clinic nurse to help with ear wax irrigation.   3. Mixed hyperlipidemia Lab Results  Component Value Date   CHOL 132 02/27/2020   HDL 65 02/27/2020   LDLCALC 56 02/27/2020   TRIG 108 10/20/2020  Continues on zocor  4. 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 Irene Limbo  5. Vitamin D deficiency Continue VIt D 2000 units qd   6. DDD (degenerative disc disease), cervical Continue mobic prn  7. Essential hypertension BP slightly elevated but for his age and frailty this ok Continue Losartan    Labs/tests ordered:  * No order type specified *CBC BMP lipid Next appt:  4 months with Dr Lyndel Safe    Total time 37min:  time greater than 50% of total time spent doing pt counseling and coordination of care

## 2021-09-06 NOTE — Patient Instructions (Addendum)
Let us know if your vitamin D is not 1,000 units each (2 daily).   Seen by Dr. Vaughan Browner 08/13/21 and recommended to have a have a f/u CT scan in 3 months. Please call and schedule your CT apt and f/u with Dr. Vaughan Browner regarding post covid and questions regarding booster administration.   Obtain debrox ear drops in each ear for 4 days then notify the clinic nurse to help with ear wax irrigation.   Only take albuterol if you feeling sob, wheezing, or having a cough. If you find you need to regularly please let pulmonary know.

## 2021-09-07 ENCOUNTER — Encounter: Payer: Self-pay | Admitting: Adult Health

## 2021-09-13 DIAGNOSIS — L570 Actinic keratosis: Secondary | ICD-10-CM | POA: Diagnosis not present

## 2021-09-13 DIAGNOSIS — Z85828 Personal history of other malignant neoplasm of skin: Secondary | ICD-10-CM | POA: Diagnosis not present

## 2021-09-13 DIAGNOSIS — L821 Other seborrheic keratosis: Secondary | ICD-10-CM | POA: Diagnosis not present

## 2021-09-13 DIAGNOSIS — Z85820 Personal history of malignant melanoma of skin: Secondary | ICD-10-CM | POA: Diagnosis not present

## 2021-09-13 DIAGNOSIS — D485 Neoplasm of uncertain behavior of skin: Secondary | ICD-10-CM | POA: Diagnosis not present

## 2021-09-14 ENCOUNTER — Telehealth: Payer: Self-pay | Admitting: Pulmonary Disease

## 2021-09-14 NOTE — Telephone Encounter (Signed)
Called and spoke with patient. He stated that he had a discussion with Dr. Vaughan Browner about checking his covid antibodies after having covid earlier this year. Per patient, he was told that he needed to wait a few months before having this test done. He wanted to see if it was time for him to have the antibodies test.   I advised him that since I didn't see any mentioning of this in the last OV, I would send a message to Dr. Vaughan Browner. He verbalized understanding.   Dr. Vaughan Browner, can you please advise if you wanted to order the covid antibodies test for him. Thanks!

## 2021-09-14 NOTE — Telephone Encounter (Signed)
disregard

## 2021-09-16 ENCOUNTER — Telehealth: Payer: Self-pay | Admitting: *Deleted

## 2021-09-16 NOTE — Telephone Encounter (Signed)
Patient notified and agreed.  

## 2021-09-16 NOTE — Telephone Encounter (Signed)
Yes this is correct.

## 2021-09-16 NOTE — Telephone Encounter (Signed)
Patient called and stated that there was some question at his last appointment regarding the dosage of his Vitamin D.   Patient stated that he is taking Vitamin D3 50 mcg (2000iu) One tablet Daily.    Patient is wanting to be sure this is what you want him to be taking.   Please Advise.

## 2021-09-17 NOTE — Telephone Encounter (Signed)
Called and spoke with patient to let him know of message from Dr. Vaughan Browner he expressed understanding. Nothing further needed at this time.

## 2021-09-17 NOTE — Telephone Encounter (Signed)
We actually checked his COVID antibodies on 03/10/2021 and they were positive.  I do not think we need to repeat them again

## 2021-09-24 DIAGNOSIS — J9601 Acute respiratory failure with hypoxia: Secondary | ICD-10-CM | POA: Diagnosis not present

## 2021-10-19 ENCOUNTER — Other Ambulatory Visit: Payer: Self-pay | Admitting: *Deleted

## 2021-10-19 ENCOUNTER — Other Ambulatory Visit: Payer: Self-pay | Admitting: Adult Health

## 2021-10-19 MED ORDER — MELOXICAM 7.5 MG PO TABS: 7.5000 mg | ORAL_TABLET | Freq: Every day | ORAL | 1 refills | Status: DC

## 2021-10-19 NOTE — Telephone Encounter (Signed)
Received refill Request from pharmacy.  Pended Rx and sent to [facility] for approval.

## 2021-10-25 DIAGNOSIS — J9601 Acute respiratory failure with hypoxia: Secondary | ICD-10-CM | POA: Diagnosis not present

## 2021-11-02 IMAGING — DX DG CHEST 1V PORT
1 series · 1 of 1 positions shown · non-contrast
Comparison: November 02, 2020

CLINICAL DATA: Shortness of breath.

EXAM:
PORTABLE CHEST 1 VIEW

[chest]
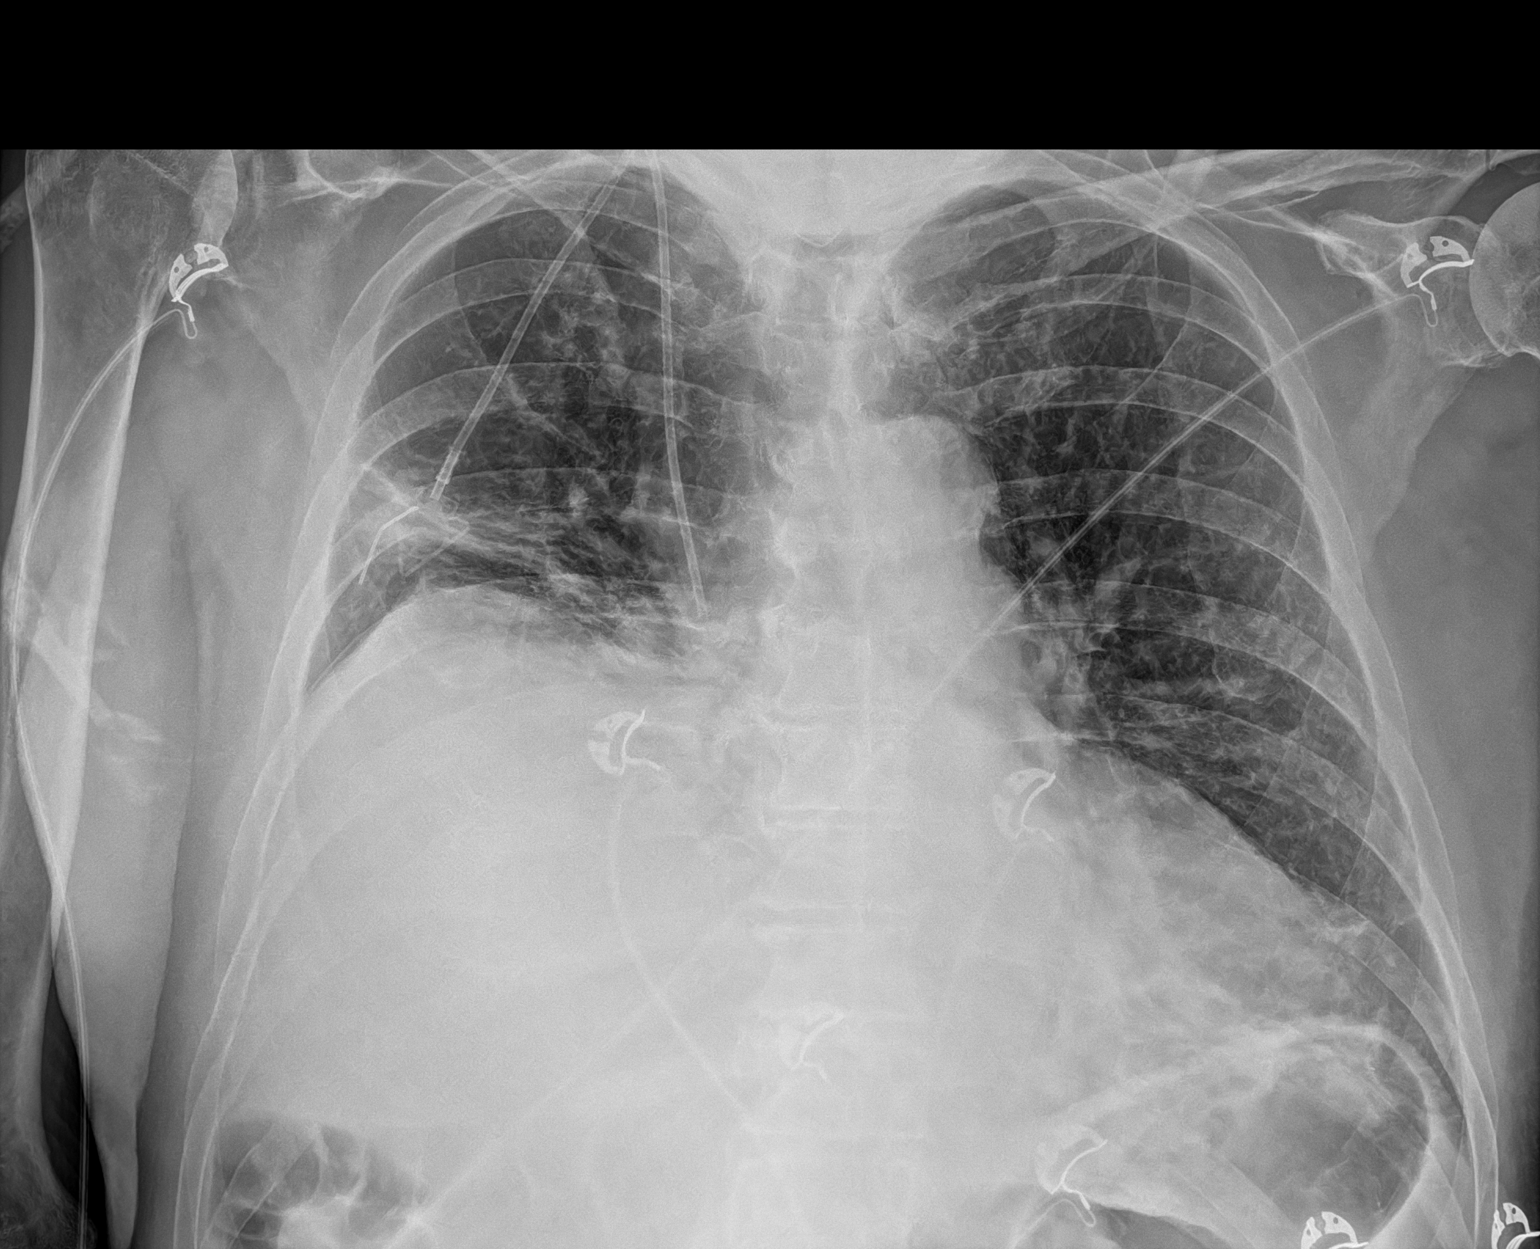

[1 of 1 positions shown; findings below may reference images not displayed]

FINDINGS: There is stable right-sided venous Port-A-Cath positioning. Moderate
severity right basilar linear scarring and/or atelectasis is seen.
There is no evidence of a pleural effusion or pneumothorax. Marked
severity elevation of the right hemidiaphragm is seen and unchanged
in severity. The heart size and mediastinal contours are within
normal limits. Degenerative changes seen throughout the thoracic
spine.
IMPRESSION: Marked severity elevation of the right hemidiaphragm with moderate
severity right basilar linear scarring and/or atelectasis.

## 2021-11-25 DIAGNOSIS — J9601 Acute respiratory failure with hypoxia: Secondary | ICD-10-CM | POA: Diagnosis not present

## 2021-11-30 DIAGNOSIS — Z85820 Personal history of malignant melanoma of skin: Secondary | ICD-10-CM | POA: Diagnosis not present

## 2021-11-30 DIAGNOSIS — Z85828 Personal history of other malignant neoplasm of skin: Secondary | ICD-10-CM | POA: Diagnosis not present

## 2021-11-30 DIAGNOSIS — L570 Actinic keratosis: Secondary | ICD-10-CM | POA: Diagnosis not present

## 2021-12-14 ENCOUNTER — Ambulatory Visit (INDEPENDENT_AMBULATORY_CARE_PROVIDER_SITE_OTHER)
Admission: RE | Admit: 2021-12-14 | Discharge: 2021-12-14 | Disposition: A | Discharge status: Home / Self Care | Payer: PPO | Source: Ambulatory Visit | Attending: Pulmonary Disease | Admitting: Pulmonary Disease

## 2021-12-14 ENCOUNTER — Other Ambulatory Visit: Payer: Self-pay

## 2021-12-14 DIAGNOSIS — I2510 Atherosclerotic heart disease of native coronary artery without angina pectoris: Secondary | ICD-10-CM | POA: Diagnosis not present

## 2021-12-14 DIAGNOSIS — C8339 Diffuse large B-cell lymphoma, extranodal and solid organ sites: Secondary | ICD-10-CM

## 2021-12-14 DIAGNOSIS — J984 Other disorders of lung: Secondary | ICD-10-CM | POA: Diagnosis not present

## 2021-12-14 DIAGNOSIS — Z8701 Personal history of pneumonia (recurrent): Secondary | ICD-10-CM | POA: Diagnosis not present

## 2021-12-14 DIAGNOSIS — I7789 Other specified disorders of arteries and arterioles: Secondary | ICD-10-CM | POA: Diagnosis not present

## 2021-12-14 DIAGNOSIS — R0602 Shortness of breath: Secondary | ICD-10-CM | POA: Diagnosis not present

## 2021-12-15 ENCOUNTER — Inpatient Hospital Stay: Payer: PPO

## 2021-12-15 ENCOUNTER — Inpatient Hospital Stay: Payer: PPO | Admitting: Hematology

## 2021-12-17 ENCOUNTER — Encounter: Payer: Self-pay | Admitting: Nurse Practitioner

## 2021-12-17 ENCOUNTER — Ambulatory Visit (INDEPENDENT_AMBULATORY_CARE_PROVIDER_SITE_OTHER): Payer: PPO | Admitting: Nurse Practitioner

## 2021-12-17 ENCOUNTER — Other Ambulatory Visit: Payer: Self-pay

## 2021-12-17 VITALS — BP 130/83 | HR 75 | Temp 97.5°F | Ht 72.0 in | Wt 190.0 lb

## 2021-12-17 DIAGNOSIS — E782 Mixed hyperlipidemia: Secondary | ICD-10-CM | POA: Diagnosis not present

## 2021-12-17 DIAGNOSIS — I2510 Atherosclerotic heart disease of native coronary artery without angina pectoris: Secondary | ICD-10-CM | POA: Diagnosis not present

## 2021-12-17 DIAGNOSIS — I700 Atherosclerosis of aorta: Secondary | ICD-10-CM

## 2021-12-17 MED ORDER — ASPIRIN 81 MG PO TBEC: 81.0000 mg | DELAYED_RELEASE_TABLET | Freq: Every day | ORAL | 12 refills | Status: DC

## 2021-12-17 NOTE — Progress Notes (Signed)
Careteam: Patient Care Team: Antonio Dad, MD as PCP - General (Internal Medicine)  PLACE OF SERVICE:  [place]  Advanced Directive information    Allergies  Allergen Reactions   Latex Itching    Latex tape causes itching and redness.    Chief Complaint  Patient presents with   Acute Visit    Patient would like to discuss CT scan results.     HPI: Patient is a 81 y.o. male here to discuss CT scan . Patient is concerned about aortic atherosclerosis diagnosis on the CT scan. Wants to understand if there is anything necessary to manage it. Does not have a cardiologist, scan was done to for pulmonary hypertension.  Diet: Described as healthy  Exercise: Does flex and flow and exercise machines (30 reps each 3 times per week). Has never smoked States he has not heard back from pulmonary who ordered CT.   CT Chest High Resolution  Result Date: 12/15/2021 CLINICAL DATA:  Shortness of breath, post inflammatory fibrosis, COVID pneumonia EXAM: CT CHEST WITHOUT CONTRAST TECHNIQUE: Multidetector CT imaging of the chest was performed following the standard protocol without intravenous contrast. High resolution imaging of the lungs, as well as inspiratory and expiratory imaging, was performed. RADIATION DOSE REDUCTION: This exam was performed according to the departmental dose-optimization program which includes automated exposure control, adjustment of the mA and/or kV according to patient size and/or use of iterative reconstruction technique. COMPARISON:  06/15/2021 FINDINGS: Cardiovascular: Aortic atherosclerosis. Right chest port catheter. Mild cardiomegaly. Left coronary artery calcifications. No pericardial effusion. Gross enlargement of the main pulmonary artery, measuring up to 4.2 cm in caliber. Mediastinum/Nodes: No enlarged mediastinal, hilar, or axillary lymph nodes. Thyroid gland, trachea, and esophagus demonstrate no significant findings. Lungs/Pleura: Unchanged pattern of  predominantly bandlike scarring throughout the lungs, concentrated in the lung bases, with arcing, bandlike elements and subpleural sparing. Volume loss of the right middle and right lower lobes with severe elevation of the right hemidiaphragm. No significant air trapping on expiratory phase imaging. No pleural effusion or pneumothorax. Upper Abdomen: No acute abnormality. Musculoskeletal: No chest wall abnormality. No suspicious osseous lesions identified. IMPRESSION: 1. Unchanged pattern of predominantly bandlike scarring throughout the lungs, concentrated in the lung bases, with arcing, bandlike elements and subpleural sparing. Volume loss of the right middle and right lower lobes with severe elevation of the right hemidiaphragm. 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 2. Coronary artery disease. 3. Gross enlargement of the main pulmonary artery, as can be seen in pulmonary hypertension. Aortic Atherosclerosis (ICD10-I70.0). Electronically Signed   By: Delanna Ahmadi M.D.   On: 12/15/2021 13:51     Review of Systems:  Review of Systems  Constitutional:  Negative for chills, fever and malaise/fatigue.  Respiratory:  Positive for shortness of breath. Negative for cough, sputum production and wheezing.        SOB with activity , use [place]   Cardiovascular:  Negative for chest pain, palpitations, orthopnea, claudication and leg swelling.  Gastrointestinal:  Negative for abdominal pain, constipation, diarrhea, heartburn, nausea and vomiting.  Genitourinary:  Negative for dysuria, frequency and urgency.  Musculoskeletal:  Negative for falls and myalgias.   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   ED (erectile dysfunction)    Elevated diaphragm    right   Erectile dysfunction    History of basal cell carcinoma (BCC) excision    multiple excision in office   History of kidney stones    History of seizure    03-03-2020 per pt x1 while taking interferan for melanoma in 2001 ,  none since, told a side effect of interferan   History of squamous cell carcinoma excision    multiple skin excision in office   Hyperlipidemia    Hypertension    followed by pcp  (03-03-2020 per pt had a stress test approx. 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 He has never used smokeless tobacco. He reports current alcohol use of about 14.0 standard drinks per week. He reports that he does not use drugs.  Family History  Problem Relation Age of Onset   Cataracts Mother    Transient  ischemic attack Mother    Asthma Mother    Cataracts Father    Diabetes Father        borderline   Hypertension Father    Hyperlipidemia Father    Hyperlipidemia Son    Hypertension Son    Amblyopia Neg Hx    Blindness Neg Hx    Glaucoma Neg Hx    Macular degeneration Neg Hx    Retinal detachment Neg Hx    Strabismus Neg Hx    Retinitis pigmentosa Neg Hx     Medications: Patient's Medications  New Prescriptions   No medications on file  Previous Medications   ALBUTEROL (VENTOLIN HFA) 108 (90 BASE) MCG/ACT INHALER    Inhale 2 puffs into the lungs every 6 (six) hours as needed for shortness of breath.   CARBOXYMETHYLCELLULOSE (REFRESH PLUS) 0.5 % SOLN    Place 1 drop into both eyes daily as needed.   CHOLECALCIFEROL 50 MCG (2000 UT) CAPS    Take 1 capsule by mouth 2 (two) times daily.   FLUTICASONE (FLONASE) 50 MCG/ACT NASAL SPRAY    Place 2 sprays into both nostrils daily.   LOSARTAN (COZAAR) 100 MG TABLET    TAKE 1 TABLET BY MOUTH EVERY DAY   MULTIPLE VITAMINS-MINERALS (PRESERVISION AREDS 2 PO)    Take 1 capsule by mouth 2 (two)  times daily.    PROPRANOLOL (INDERAL) 10 MG TABLET    TAKE 1 TABLET BY MOUTH THREE TIMES A DAY   SIMVASTATIN (ZOCOR) 40 MG TABLET    Take 1 tablet (40 mg total) by mouth daily.   TESTOSTERONE 20.25 MG/ACT (1.62%) GEL    Apply 40.5 mg topically daily.   VALACYCLOVIR (VALTREX) 1000 MG TABLET    Take 1000 (1 grams) PO 2 times daily for 3 days as needed for flares (herpes)   VITAMIN B-12 (CYANOCOBALAMIN) 1000 MCG TABLET    Take 1,000 mcg by mouth daily.  Modified Medications   No medications on file  Discontinued Medications   ALBUTEROL (PROVENTIL) (2.5 MG/3ML) 0.083% NEBULIZER SOLUTION    Take 3 mLs (2.5 mg total) by nebulization every 4 (four) hours as needed for shortness of breath.   BLACK PEPPER-TURMERIC (TURMERIC COMPLEX/BLACK PEPPER PO)    Take 1,000 mg by mouth daily at 12 noon.   MELOXICAM (MOBIC) 7.5 MG TABLET    Take 1 tablet (7.5 mg total) by  mouth daily.    Physical Exam:  Vitals:   12/17/21 0922  BP: 130/83  Pulse: 75  Temp: (!) 97.5 F (36.4 C)  SpO2: 97%  Weight: 190 lb (86.2 kg)  Height: 6' (1.829 m)   Body mass index is 25.77 kg/m. Wt Readings from Last 3 Encounters:  12/17/21 190 lb (86.2 kg)  09/06/21 188 lb 10.2 oz (85.6 kg)  08/19/21 187 lb 11.2 oz (85.1 kg)    Physical Exam Constitutional:      Appearance: Normal appearance. He is normal weight.  Cardiovascular:     Rate and Rhythm: Normal rate and regular rhythm.     Pulses: Normal pulses.     Heart sounds: Normal heart sounds.  Pulmonary:     Effort: Pulmonary effort is normal.     Breath sounds: Normal breath sounds.  Abdominal:     General: Bowel sounds are normal.  Musculoskeletal:        General: No swelling.     Right lower leg: No edema.     Left lower leg: No edema.  Neurological:     Mental Status: He is alert.    Labs reviewed: Basic Metabolic Panel: Recent Labs    06/19/21 0506 06/20/21 0304 06/21/21 0319 06/28/21 0000 08/19/21 0942  NA 140 140 142 146 142  K 3.6 3.7 4.2 4.4 4.1  CL 99 96* 100 101 105  CO2 35* 34* 35* 28* 30  GLUCOSE 105* 113* 101*  --  89  BUN '21 17 16 15 17  ' CREATININE 0.62 0.64 0.66 0.8 0.80  CALCIUM 8.0* 8.2* 8.4* 8.8 9.0  MG 2.1  --  2.0 2.3  --   PHOS 3.0  --  3.4 3.8  --    Liver Function Tests: Recent Labs    06/19/21 0506 06/20/21 0304 08/19/21 0942  AST '21 21 15  ' ALT 100* 84* 14  ALKPHOS 82 84 57  BILITOT 0.6 0.7 1.0  PROT 5.4* 6.0* 6.6  ALBUMIN 2.7* 3.1* 4.0   No results for input(s): LIPASE, AMYLASE in the last 8760 hours. No results for input(s): AMMONIA in the last 8760 hours. CBC: Recent Labs    06/18/21 0430 06/21/21 0319 08/19/21 0942  WBC 12.5* 16.7* 6.0  NEUTROABS 9.7* 12.3* 4.0  HGB 12.5* 14.5 14.9  HCT 39.9 45.6 44.5  MCV 107.3* 104.1* 101.1*  PLT 227 304 189   Lipid Panel: No results for input(s): CHOL, HDL, LDLCALC,  TRIG, CHOLHDL, LDLDIRECT in the last  8760 hours. TSH: No results for input(s): TSH in the last 8760 hours. A1C: Lab Results  Component Value Date   HGBA1C 5.7 02/27/2020     Assessment/Plan  1. Aortic atherosclerosis (HCC) -CT scan on 12/15/2021 showed Aortic Atherosclerosis  -start ASA 81 mg daily  - Lipid panel- he is taking zocor, may benefit from crestor or lipitor if not at goal.  - CMP with eGFR([facility]) - Ambulatory referral to Cardiology -Condition is stable but will continue to monitor it.   2. Mixed hyperlipidemia -on zocor, dietary modifications encouraged - Lipid panel - CMP with eGFR([facility]) -Will follow up once labs result  3. Coronary artery disease involving native coronary artery of native heart without angina pectoris - Start aspirin 81 mg QD - Ambulatory referral to Cardiology -Stable will continue to monitor.   Antonio [name]. Antonio [name] I personally was present during the history, physical exam and medical decision-making activities of this service and have verified that the service and findings are accurately documented in the students note [facility] [phone]

## 2021-12-18 ENCOUNTER — Encounter: Payer: Self-pay | Admitting: Hematology

## 2021-12-18 LAB — LIPID PANEL
Cholesterol: 148 mg/dL (ref <200)
HDL: 63 mg/dL (ref >=40)
LDL Cholesterol (Calc): 67 mg/dL (calc)
Non-HDL Cholesterol (Calc): 85 mg/dL (calc) (ref <130)
Total CHOL/HDL Ratio: 2.3 (calc) (ref <5.0)
Triglycerides: 96 mg/dL (ref <150)

## 2021-12-18 LAB — COMPLETE METABOLIC PANEL WITH GFR
AG Ratio: 2 (calc) (ref 1.0–2.5)
ALT: 20 U/L (ref 9–46)
AST: 19 U/L (ref 10–35)
Albumin: 4.3 g/dL (ref 3.6–5.1)
Alkaline phosphatase (APISO): 57 U/L (ref 35–144)
BUN: 17 mg/dL (ref 7–25)
CO2: 28 mmol/L (ref 20–32)
Calcium: 9.8 mg/dL (ref 8.6–10.3)
Chloride: 102 mmol/L (ref 98–110)
Creat: 0.88 mg/dL (ref 0.70–1.22)
Globulin: 2.1 g/dL (calc) (ref 1.9–3.7)
Glucose, Bld: 93 mg/dL (ref 65–139)
Potassium: 4.3 mmol/L (ref 3.5–5.3)
Sodium: 140 mmol/L (ref 135–146)
Total Bilirubin: 0.9 mg/dL (ref 0.2–1.2)
Total Protein: 6.4 g/dL (ref 6.1–8.1)
eGFR: 86 mL/min/{1.73_m2} (ref >=60)

## 2021-12-23 DIAGNOSIS — J9601 Acute respiratory failure with hypoxia: Secondary | ICD-10-CM | POA: Diagnosis not present

## 2021-12-24 ENCOUNTER — Telehealth: Payer: Self-pay

## 2021-12-24 NOTE — Telephone Encounter (Signed)
Patient called and [facility] stating he was just seen at Dr. Marlou Porch office and had a CT done, why is he going back.  ? ?Called and [facility] that the next appointment is to establish care with the cardiologist. No further questions.  ?

## 2021-12-28 DIAGNOSIS — I10 Essential (primary) hypertension: Secondary | ICD-10-CM | POA: Diagnosis not present

## 2021-12-28 DIAGNOSIS — J181 Lobar pneumonia, unspecified organism: Secondary | ICD-10-CM | POA: Diagnosis not present

## 2021-12-31 ENCOUNTER — Ambulatory Visit: Payer: PPO | Admitting: Cardiology

## 2021-12-31 ENCOUNTER — Encounter: Payer: Self-pay | Admitting: Cardiology

## 2021-12-31 ENCOUNTER — Other Ambulatory Visit: Payer: Self-pay

## 2021-12-31 VITALS — BP 136/90 | HR 66 | Ht 72.0 in | Wt 190.0 lb

## 2021-12-31 DIAGNOSIS — I2584 Coronary atherosclerosis due to calcified coronary lesion: Secondary | ICD-10-CM | POA: Diagnosis not present

## 2021-12-31 DIAGNOSIS — J9611 Chronic respiratory failure with hypoxia: Secondary | ICD-10-CM | POA: Diagnosis not present

## 2021-12-31 DIAGNOSIS — I10 Essential (primary) hypertension: Secondary | ICD-10-CM

## 2021-12-31 DIAGNOSIS — I700 Atherosclerosis of aorta: Secondary | ICD-10-CM | POA: Diagnosis not present

## 2021-12-31 DIAGNOSIS — I447 Left bundle-branch block, unspecified: Secondary | ICD-10-CM | POA: Diagnosis not present

## 2021-12-31 DIAGNOSIS — I2510 Atherosclerotic heart disease of native coronary artery without angina pectoris: Secondary | ICD-10-CM

## 2021-12-31 NOTE — Assessment & Plan Note (Signed)
Stable chronic, echocardiogram personally reviewed. ?

## 2021-12-31 NOTE — Assessment & Plan Note (Signed)
Pulmonary notes reviewed.  I personally reviewed his CT scan and I do believe that the pulmonary artery measurement was overzealous.  I think it caught part of the left pulmonary and right pulmonary artery branch point.  Ultimately, there may be only minimally enlarged pulmonary artery.  Echocardiogram reviewed as well reassuring.  With his underlying interstitial lung disease, certainly the right-sided structures can be mildly elevated.  I would not change any course of management.  Discussed with him. ?

## 2021-12-31 NOTE — Patient Instructions (Signed)
Medication Instructions:  ?The current medical regimen is effective;  continue present plan and medications. ? ?*If you need a refill on your cardiac medications before your next appointment, please call your pharmacy* ? ?Follow-Up: ?At [facility], you and your health needs are our priority.  As part of our continuing mission to provide you with exceptional heart care, we have created designated Provider Care Teams.  These Care Teams include your primary Cardiologist (physician) and Advanced Practice Providers (APPs -  Physician Assistants and Nurse Practitioners) who all work together to provide you with the care you need, when you need it. ? ?We recommend signing up for the patient portal called "MyChart".  Sign up information is provided on this After Visit Summary.  MyChart is used to connect with patients for Virtual Visits (Telemedicine).  Patients are able to view lab/test results, encounter notes, upcoming appointments, etc.  Non-urgent messages can be sent to your provider as well.   ?To learn more about what you can do with MyChart, go to [email or web address].   ? ?Your next appointment:   ?Follow up with Dr Marlou Porch as needed. ? ?Thank you for choosing [facility]!! ? ? ? ?

## 2021-12-31 NOTE — Assessment & Plan Note (Signed)
Continue with current simvastatin 40 mg.  Excellent LDL 67.  Aspirin 81 mg. ?

## 2021-12-31 NOTE — Progress Notes (Signed)
?Cardiology Office Note:   ? ?Date:  12/31/2021  ? ?ID:  Antonio [name], DOB [birth year], MRN [id number] ? ?PCP:  [name], MD ?  ?[facility] Providers ?Cardiologist:  None    ? ?Referring MD: Lauree Chandler, NP  ? ? ?History of Present Illness:   ? ?Antonio [name] is a 81 y.o. male here for the evaluation of dilated pulmonary artery, aortic atherosclerosis and coronary atherosclerosis/calcification seen on CT scan at the request of Sherrie Mustache, NP.  Has a history of left bundle branch block left-sided carotid stenosis asymptomatic. ? ?He currently takes simvastatin 40 mg a day with LDL of 67 ? ?Retired, [employer]. ? ?Prior COVID, respiratory failure. ? ?Past Medical History:  ?Diagnosis Date  ? Age-related macular degeneration, dry, both eyes   ? B-cell lymphoma [facility]) oncologist--- dr Irene Limbo  ? 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 Carotid stenosis, asymptomatic, left ?  ? ED (erectile dysfunction)   ? Elevated diaphragm   ? right  ? Erectile dysfunction   ? History of basal cell carcinoma (BCC) excision   ? multiple excision in office  ? History of kidney stones   ? History of seizure   ? 03-03-2020 per pt x1 while taking interferan for melanoma in 2001 ,  none since, told a side effect of interferan  ? History of squamous cell carcinoma excision   ? multiple skin excision in office  ? Hyperlipidemia   ? Hypertension   ? followed by pcp  (03-03-2020 per pt had a stress test approx. 2010, told normal)  ? LBBB (left bundle branch block)   ? Lymphoma of testis [facility]) 2000  ? s/p right orchiectomy   ? Mass of left testis   ? MGUS (monoclonal gammopathy of unknown significance)   ? oncologist--- dr Irene Limbo  ? Mixed hyperlipidemia 10/26/2020  ? Personal history of malignant melanoma of skin dermatologist--- dr Wilhemina Bonito  ? dx 2001 left calf area  s/p WLE ,   completed one year interferan (03-03-2020 pt states did not involve lymph nodes, no recurrence)  ? Urgency of urination   ? Wears hearing aid in both ears   ? ? ?Past Surgical History:  ?Procedure Laterality Date  ? CATARACT EXTRACTION W/ INTRAOCULAR LENS IMPLANT Bilateral 2018  ? IR IMAGING GUIDED PORT INSERTION  03/31/2020  ? MELANOMA EXCISION  10/1999  ? ORCHIECTOMY Right 2000  ? ORCHIECTOMY Left 03/05/2020  ? Procedure: ORCHIECTOMY;  Surgeon: [name], MD;  Location: [facility];  Service: Urology;  Laterality: Left;  ? TONSILLECTOMY  child  ? TOTAL KNEE ARTHROPLASTY Right 06/2009  ? ? ?Current Medications: ?Current Meds  ?Medication Sig  ? albuterol (VENTOLIN HFA) 108 (90 Base) MCG/ACT inhaler Inhale 2 puffs into the lungs every 6 (six) hours as needed for shortness of breath.  ? aspirin 81 MG EC tablet Take 1 tablet (81 mg total) by mouth daily. Swallow whole.  ? carboxymethylcellulose (REFRESH PLUS) 0.5 % SOLN Place 1 drop into both eyes daily as needed.  ? Cholecalciferol 50 MCG (2000 UT) CAPS Take 1 capsule by mouth 2 (two) times daily.  ? fluticasone (FLONASE) 50 MCG/ACT nasal spray Place 2 sprays into both nostrils daily.  ? losartan (COZAAR) 100 MG tablet TAKE 1 TABLET BY MOUTH EVERY DAY  ? Multiple Vitamins-Minerals (PRESERVISION AREDS 2 PO) Take  1 capsule by mouth 2 (two) times daily.   ? propranolol (INDERAL) 10 MG tablet TAKE 1 TABLET BY MOUTH THREE TIMES A DAY  ? simvastatin (ZOCOR) 40 MG tablet Take 1 tablet (40 mg total) by mouth daily.  ? Testosterone 20.25 MG/ACT (1.62%) GEL Apply 40.5 mg topically daily.  ? valACYclovir (VALTREX) 1000 MG tablet Take 1000 (1 grams) PO 2 times daily for 3 days as needed for flares (herpes)  ? vitamin B-12 (CYANOCOBALAMIN) 1000 MCG tablet Take 1,000 mcg by mouth daily.  ?  ? ?Allergies:   Latex  ? ?Social History  ? ?Socioeconomic History  ? Marital status: Married  ?  Spouse name: Not on file  ? Number of children: Not on file  ? Years of  education: Not on file  ? Highest education level: Not on file  ?Occupational History  ? Not on file  ?Tobacco Use  ? Smoking status: Never  ? Smokeless tobacco: Never  ?Vaping Use  ? Vaping Use: Never used  ?Substance and Sexual Activity  ? Alcohol use: Yes  ?  Alcohol/week: 14.0 standard drinks  ?  Types: 14 Glasses of wine per week  ?  Comment: 2 wine daily  ? Drug use: Never  ? Sexual activity: Not on file  ?Other Topics Concern  ? Not on file  ?Social History Narrative  ? Social History  ?   ? Diet?good  ?   ? Do you drink/eat things with caffeine? seldom  ?   ? Marital status?          yes                          What year were you married? 1962  ?   ? Do you live in a house, apartment, assisted living, condo, trailer, etc.? house  ?   ? Is it one or more stories?1  ?   ? How many persons live in your home? 2  ?   ? Do you have any pets in your home? (please list) no  ?   ? Highest level of education completed? college  ?   ? Current or past profession: president and [employer]  ?   ? Advanced Directives  ?   ? Do you exercise?   yes                                   Type & how often? 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   ? Do you have a living will? yes  ?   ? Do you have a DNR form?       yes                            If not, do you want to discuss one?  ?   ? Do you have signed POA/HPOA for forms?  yes  ?   ? Functional Status  ?   ? Do you have difficulty bathing or dressing yourself? no  ?   ? Do you have difficulty preparing food or eating? no  ?   ? Do you have difficulty managing your medications? no  ?   ? Do you have difficulty managing your finances? no  ?   ? Do you have difficulty affording your medications? no  ? ?  Social Determinants of Health  ? ?Financial Resource Strain: Not on file  ?Food Insecurity: Not on file  ?Transportation Needs: Not on file  ?Physical Activity: Not on file  ?Stress: Not on file  ?Social Connections: Not on file  ?  ? ?Family History: ?The patient's family history includes  Asthma in his mother; Cataracts in his father and mother; Diabetes in his father; Hyperlipidemia in his father and son; Hypertension in his father and son; Transient ischemic attack in his mother. There is no history of Amblyopia, Blindness, Glaucoma, Macular degeneration, Retinal detachment, Strabismus, or Retinitis pigmentosa. ? ?ROS:   ?Please see the history of present illness.    ? All other systems reviewed and are negative. ? ?EKGs/Labs/Other Studies Reviewed:   ? ?The following studies were reviewed today: ?Echocardiogram 11/02/2020: ? 1. Left ventricular ejection fraction, by estimation, is 50%. The left  ?ventricle has low normal function. The left ventricle has no regional wall  ?motion abnormalities. There is mild left ventricular hypertrophy. Left  ?ventricular diastolic parameters are  ? consistent with Grade I diastolic dysfunction (impaired relaxation).  ?There is abnormal septal motion due to conduction delay.  ? 2. Right ventricular systolic function is mildly reduced. The right  ?ventricular size is normal. Tricuspid regurgitation signal is inadequate  ?for assessing PA pressure.  ? 3. The mitral valve is grossly normal. No evidence of mitral valve  ?regurgitation.  ? 4. The aortic valve is grossly normal. There is mild calcification of the  ?aortic valve. Aortic valve regurgitation is not visualized. No aortic  ?stenosis is present.  ? 5. The inferior vena cava is normal in size with greater than 50%  ?respiratory variability, suggesting right atrial pressure of 3 mmHg.  ? ?EKG:  prior EKG from 06/19/2021 shows sinus rhythm with left bundle branch block like abnormality. ? ?Recent Labs: ?06/15/2021: B Natriuretic Peptide 66.3 ?06/28/2021: Magnesium 2.3 ?08/19/2021: Hemoglobin 14.9; Platelets 189 ?12/17/2021: ALT 20; BUN 17; Creat 0.88; Potassium 4.3; Sodium 140  ?Recent Lipid Panel ?   ?Component Value Date/Time  ? CHOL 148 12/17/2021 1003  ? TRIG 96 12/17/2021 1003  ? HDL 63 12/17/2021 1003  ? CHOLHDL  2.3 12/17/2021 1003  ? [place] 67 12/17/2021 1003  ? ?LDL 67 ?, Creatinine 0.88 potassium 4.3 ? ?Risk Assessment/Calculations:   ? ? ?    ? ?   ? ?Physical Exam:   ? ?VS:  BP 136/90   Pulse 66   Ht 6' (1.829 m)   W

## 2021-12-31 NOTE — Assessment & Plan Note (Signed)
Simvastatin 40 mg, excellent LDL less than 70.  Aspirin 81 mg would be helpful.  Watch for any signs of bleeding. ?

## 2022-01-05 ENCOUNTER — Encounter: Payer: PPO | Admitting: Internal Medicine

## 2022-01-11 DIAGNOSIS — H9193 Unspecified hearing loss, bilateral: Secondary | ICD-10-CM | POA: Diagnosis not present

## 2022-01-11 DIAGNOSIS — G250 Essential tremor: Secondary | ICD-10-CM | POA: Diagnosis not present

## 2022-01-11 DIAGNOSIS — H3530 Unspecified macular degeneration: Secondary | ICD-10-CM | POA: Diagnosis not present

## 2022-01-11 DIAGNOSIS — E785 Hyperlipidemia, unspecified: Secondary | ICD-10-CM | POA: Diagnosis not present

## 2022-01-11 DIAGNOSIS — I10 Essential (primary) hypertension: Secondary | ICD-10-CM | POA: Diagnosis not present

## 2022-01-11 DIAGNOSIS — R0602 Shortness of breath: Secondary | ICD-10-CM | POA: Diagnosis not present

## 2022-01-11 DIAGNOSIS — G8929 Other chronic pain: Secondary | ICD-10-CM | POA: Diagnosis not present

## 2022-01-11 DIAGNOSIS — E663 Overweight: Secondary | ICD-10-CM | POA: Diagnosis not present

## 2022-01-11 DIAGNOSIS — B009 Herpesviral infection, unspecified: Secondary | ICD-10-CM | POA: Diagnosis not present

## 2022-01-11 DIAGNOSIS — M1990 Unspecified osteoarthritis, unspecified site: Secondary | ICD-10-CM | POA: Diagnosis not present

## 2022-01-11 DIAGNOSIS — J309 Allergic rhinitis, unspecified: Secondary | ICD-10-CM | POA: Diagnosis not present

## 2022-01-11 DIAGNOSIS — E291 Testicular hypofunction: Secondary | ICD-10-CM | POA: Diagnosis not present

## 2022-01-23 DIAGNOSIS — J9601 Acute respiratory failure with hypoxia: Secondary | ICD-10-CM | POA: Diagnosis not present

## 2022-01-26 ENCOUNTER — Encounter: Payer: PPO | Admitting: Internal Medicine

## 2022-01-28 ENCOUNTER — Telehealth: Payer: Self-pay | Admitting: Pulmonary Disease

## 2022-01-28 NOTE — Telephone Encounter (Signed)
Called and spoke with pt who stated [facility] told him that we needed to do a walk test at our office and I stated to him that he was also due for a f/u with Dr. Vaughan Browner to discuss CT. Pt verbalized understanding. Appt has been scheduled. Nothing further needed. ?

## 2022-02-01 ENCOUNTER — Other Ambulatory Visit: Payer: Self-pay | Admitting: *Deleted

## 2022-02-01 MED ORDER — LOSARTAN POTASSIUM 100 MG PO TABS: 100.0000 mg | ORAL_TABLET | Freq: Every day | ORAL | 1 refills | Status: DC

## 2022-02-01 NOTE — Telephone Encounter (Signed)
Refill Request from [facility] ?

## 2022-02-02 ENCOUNTER — Other Ambulatory Visit: Payer: Self-pay

## 2022-02-02 ENCOUNTER — Inpatient Hospital Stay: Payer: PPO | Attending: Hematology

## 2022-02-02 ENCOUNTER — Inpatient Hospital Stay: Payer: PPO | Admitting: Hematology

## 2022-02-02 ENCOUNTER — Encounter: Payer: PPO | Admitting: Internal Medicine

## 2022-02-02 VITALS — BP 143/82 | HR 70 | Temp 97.7°F | Resp 18 | Wt 189.7 lb

## 2022-02-02 DIAGNOSIS — C8339 Diffuse large B-cell lymphoma, extranodal and solid organ sites: Secondary | ICD-10-CM | POA: Diagnosis not present

## 2022-02-02 DIAGNOSIS — D472 Monoclonal gammopathy: Secondary | ICD-10-CM | POA: Insufficient documentation

## 2022-02-02 LAB — CBC WITH DIFFERENTIAL (CANCER CENTER ONLY)
Abs Immature Granulocytes: 0.01 10*3/uL (ref 0.00–0.07)
Basophils Absolute: 0.1 10*3/uL (ref 0.0–0.1)
Basophils Relative: 1 %
Eosinophils Absolute: 0.1 10*3/uL (ref 0.0–0.5)
Eosinophils Relative: 2 %
HCT: 45.8 % (ref 39.0–52.0)
Hemoglobin: 15 g/dL (ref 13.0–17.0)
Immature Granulocytes: 0 %
Lymphocytes Relative: 24 %
Lymphs Abs: 1.3 10*3/uL (ref 0.7–4.0)
MCH: 33.2 pg (ref 26.0–34.0)
MCHC: 32.8 g/dL (ref 30.0–36.0)
MCV: 101.3 fL — ABNORMAL HIGH (ref 80.0–100.0)
Monocytes Absolute: 0.7 10*3/uL (ref 0.1–1.0)
Monocytes Relative: 12 %
Neutro Abs: 3.3 10*3/uL (ref 1.7–7.7)
Neutrophils Relative %: 61 %
Platelet Count: 194 10*3/uL (ref 150–400)
RBC: 4.52 MIL/uL (ref 4.22–5.81)
RDW: 13.1 % (ref 11.5–15.5)
WBC Count: 5.4 10*3/uL (ref 4.0–10.5)
nRBC: 0 % (ref 0.0–0.2)

## 2022-02-02 LAB — CMP (CANCER CENTER ONLY)
ALT: 25 U/L (ref 0–44)
AST: 21 U/L (ref 15–41)
Albumin: 4 g/dL (ref 3.5–5.0)
Alkaline Phosphatase: 54 U/L (ref 38–126)
Anion gap: 3 — ABNORMAL LOW (ref 5–15)
BUN: 19 mg/dL (ref 8–23)
CO2: 33 mmol/L — ABNORMAL HIGH (ref 22–32)
Calcium: 9.4 mg/dL (ref 8.9–10.3)
Chloride: 103 mmol/L (ref 98–111)
Creatinine: 0.91 mg/dL (ref 0.61–1.24)
GFR, Estimated: >60 mL/min (ref >60)
Glucose, Bld: 84 mg/dL (ref 70–99)
Potassium: 4.5 mmol/L (ref 3.5–5.1)
Sodium: 139 mmol/L (ref 135–145)
Total Bilirubin: 0.7 mg/dL (ref 0.3–1.2)
Total Protein: 6.6 g/dL (ref 6.5–8.1)

## 2022-02-02 LAB — LACTATE DEHYDROGENASE: LDH: 114 U/L (ref 98–192)

## 2022-02-03 ENCOUNTER — Telehealth: Payer: Self-pay | Admitting: Hematology

## 2022-02-03 NOTE — Telephone Encounter (Signed)
Left message with follow-up appointment per 4/19 los. ?

## 2022-02-04 LAB — MULTIPLE MYELOMA PANEL, SERUM
Albumin SerPl Elph-Mcnc: 3.6 g/dL (ref 2.9–4.4)
Albumin/Glob SerPl: 1.6 (ref 0.7–1.7)
Alpha 1: 0.2 g/dL (ref 0.0–0.4)
Alpha2 Glob SerPl Elph-Mcnc: 0.7 g/dL (ref 0.4–1.0)
B-Globulin SerPl Elph-Mcnc: 0.7 g/dL (ref 0.7–1.3)
Gamma Glob SerPl Elph-Mcnc: 0.8 g/dL (ref 0.4–1.8)
Globulin, Total: 2.4 g/dL (ref 2.2–3.9)
IgA: 77 mg/dL (ref 61–437)
IgG (Immunoglobin G), Serum: 581 mg/dL — ABNORMAL LOW (ref 603–1613)
IgM (Immunoglobulin M), Srm: 350 mg/dL — ABNORMAL HIGH (ref 15–143)
M Protein SerPl Elph-Mcnc: 0.4 g/dL — ABNORMAL HIGH
Total Protein ELP: 6 g/dL (ref 6.0–8.5)

## 2022-02-07 ENCOUNTER — Encounter: Payer: Self-pay | Admitting: Hematology

## 2022-02-07 NOTE — Progress Notes (Signed)
? ? ?HEMATOLOGY/ONCOLOGY CLINIC NOTE ? ?Date of Service: .02/02/2022 ? ? ?Patient Care Team: ?[name], MD as PCP - General (Internal Medicine) ? ?CHIEF COMPLAINTS/PURPOSE OF CONSULTATION:  ?Follow-up for continued active surveillance of recurrent primary testicular large B-cell lymphoma ? ?HISTORY OF PRESENTING ILLNESS:  ? ?Antonio [name] is a wonderful 81 y.o. male who has been referred to [place] by Dr. Hollace Kinnier for evaluation and management of Bone lesion of left lower leg. The pt reports that he is doing well overall.  ? ?The pt reports that he cannot feel the ovoid finding from his 04/09/18 MRI as noted below. He denies being able to feel anything in his skin as well. He notes that he received a needle injection in his knee prior to this MRI. The pt notes that his knee has lost much cartilage and is considering a knee replacement. He denies any recent injuries to his knee or falls. He has contacted Dr. Alphonsa Overall in surgery and another surgeon as well and will be making an appointment.  ? ?The pt notes that he does not feel any differently recently as compared to 6 months to a year ago. He does note that he normally has constipation in the spring, and has continued to have mild constipation. He denies any other concerns or symptoms.  ? ?The pt previously saw my colleague Dr. Lurline Del for primary testicular B-cell lymphoma in 2000. The pt notes that the involvement was limited to only one testicle. He received 6 intrathecal treatments in his spine, and received 4 cycles of chemotherapy (thinks this was CHOP) and radiation as well. The pt was followed by an oncologist for several years at the [facility] in [place] after moving from [place] after his [place] treatment.  ? ?The pt notes that he was first diagnosed with melanoma in October 2000. He noticed a strange spot on his left leg that was surgically resected. The pt's personal notes report a 2.9cm thick, level  4, no LN involvement, high risk stage II, treated with Interferon three times a week for one year. He notes some squamous cell and basal cell involvements as well and sees a dermatologist, Dr. Wilhemina Bonito, regularly.  ? ?Of note prior to the patient's visit today, pt has had MRI Left Knee completed on 04/09/18 with results revealing An ovoid, lobulated, 54m T2 hyperintense focus within the medial subcutaneous far 1.971mdistal to the joint demonstrates demonstrates nonspecific imaging characteristics.  ? ?Most recent lab results (03/01/18) of CBC w/diff is as follows: all values are WNL except for RBC at 4.54, MCV at 100.4, MCH at 33.8.  ? ?On review of systems, pt reports left knee pain, mild constipation, and denies new or concerning symptoms, noticing any new lumps or bumps, pain along the spine, abdominal pains, problems passing urine, testicular pain/swelling, and any other symptoms.  ? ?INTERVAL HISTORY:  ? ?WiSela Huaeturns for continued evaluation and management of his recurrent primary testicular large B-cell lymphoma currently in active surveillance. ?Patient notes no acute new symptoms since his last clinic visit. ?Still needing oxygen as needed especially at higher altitudes. ?Overall breathing stable about the same. ?Has been getting more physically active. ?Has been working with pulmonology to try to get mobile oxygen concentrator. ?No new lumps or bumps. ?No fevers no chills no night sweats no unexpected weight loss. ?Labs done today were reviewed in detail. ?MEDICAL HISTORY:  ?Past Medical History:  ?Diagnosis Date  ? Age-related macular  degeneration, dry, both eyes   ? B-cell lymphoma [facility]) oncologist--- dr Irene Limbo  ? 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 Carotid stenosis, asymptomatic, left ?  ? ED (erectile dysfunction)   ? 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 Erectile dysfunction   ? History of basal cell carcinoma (BCC) excision   ? multiple excision in office  ? History of kidney stones   ? History of seizure   ? 03-03-2020 per pt x1 while taking interferan for melanoma in 2001 ,  none since, told a side effect of interferan  ? History of squamous cell carcinoma excision   ? multiple skin excision in office  ? Hyperlipidemia   ? Hypertension   ? followed by pcp  (03-03-2020 per pt had a stress test approx. 2010, told normal)  ? LBBB (left bundle branch block)   ? Lymphoma of testis [facility]) 2000  ? s/p right orchiectomy   ? Mass of left testis   ? MGUS (monoclonal gammopathy of unknown significance)   ? oncologist--- dr Irene Limbo  ? Mixed hyperlipidemia 10/26/2020  ? Personal history of malignant melanoma of skin dermatologist--- dr Wilhemina Bonito  ? dx 2001 left calf area  s/p WLE ,  completed one year interferan (03-03-2020 pt states did not involve lymph nodes, no recurrence)  ? Urgency of urination   ? Wears hearing aid in both ears   ? ? ?SURGICAL HISTORY: ?Past Surgical History:  ?Procedure Laterality Date  ? CATARACT EXTRACTION W/ INTRAOCULAR LENS IMPLANT Bilateral 2018  ? IR IMAGING GUIDED PORT INSERTION  03/31/2020  ? MELANOMA EXCISION  10/1999  ? ORCHIECTOMY Right 2000  ? ORCHIECTOMY Left 03/05/2020  ? Procedure: ORCHIECTOMY;  Surgeon: [name], MD;  Location: [facility];  Service: Urology;  Laterality: Left;  ? TONSILLECTOMY  child  ? TOTAL KNEE ARTHROPLASTY Right 06/2009  ? ? ?SOCIAL HISTORY: ?Social History  ? ?Socioeconomic History  ? Marital status: Married  ?  Spouse name: Not on file  ? Number of children: Not on file  ? Years of education: Not on file  ? Highest education level: Not on file  ?Occupational History  ? Not on file  ?Tobacco Use  ? Smoking status: Never  ? Smokeless tobacco: Never  ?Vaping Use  ? Vaping Use: Never used  ?Substance and Sexual Activity  ? Alcohol use: Yes  ?  Alcohol/week: 14.0 standard drinks  ?  Types: 14  Glasses of wine per week  ?  Comment: 2 wine daily  ? Drug use: Never  ? Sexual activity: Not on file  ?Other Topics Concern  ? Not on file  ?Social History Narrative  ? Social History  ?   ? Diet?good  ?   ? Do you drink/eat things with caffeine? seldom  ?   ? Marital status?          yes                          What year were you married? 1962  ?   ? Do you live in a house, apartment, assisted living, condo, trailer, etc.? house  ?   ? Is it one or more stories?1  ?   ? How many persons live in your home? 2  ?   ? Do  you have any pets in your home? (please list) no  ?   ? Highest level of education completed? college  ?   ? Current or past profession: president and [employer]  ?   ? Advanced Directives  ?   ? Do you exercise?   yes                                   Type & how often? [employer] work, Teacher, adult education care  ?   ? Do you have a living will? yes  ?   ? Do you have a DNR form?       yes                            If not, do you want to discuss one?  ?   ? Do you have signed POA/HPOA for forms?  yes  ?   ? Functional Status  ?   ? Do you have difficulty bathing or dressing yourself? no  ?   ? Do you have difficulty preparing food or eating? no  ?   ? Do you have difficulty managing your medications? no  ?   ? Do you have difficulty managing your finances? no  ?   ? Do you have difficulty affording your medications? no  ? ?Social Determinants of Health  ? ?Financial Resource Strain: Not on file  ?Food Insecurity: Not on file  ?Transportation Needs: Not on file  ?Physical Activity: Not on file  ?Stress: Not on file  ?Social Connections: Not on file  ?Intimate Partner Violence: Not on file  ? ? ?FAMILY HISTORY: ?Family History  ?Problem Relation Age of Onset  ? Cataracts Mother   ? Transient ischemic attack Mother   ? Asthma Mother   ? Cataracts Father   ? Diabetes Father   ?     borderline  ? Hypertension Father   ? Hyperlipidemia Father   ? Hyperlipidemia Son   ? Hypertension Son   ? Amblyopia Neg Hx   ? Blindness  Neg Hx   ? Glaucoma Neg Hx   ? Macular degeneration Neg Hx   ? Retinal detachment Neg Hx   ? Strabismus Neg Hx   ? Retinitis pigmentosa Neg Hx   ? ? ?ALLERGIES:  is allergic to latex. ? ?MEDICATIONS:  ?Curr

## 2022-02-14 DIAGNOSIS — R351 Nocturia: Secondary | ICD-10-CM | POA: Diagnosis not present

## 2022-02-14 DIAGNOSIS — R3915 Urgency of urination: Secondary | ICD-10-CM | POA: Diagnosis not present

## 2022-02-14 DIAGNOSIS — E291 Testicular hypofunction: Secondary | ICD-10-CM | POA: Diagnosis not present

## 2022-02-14 DIAGNOSIS — R5382 Chronic fatigue, unspecified: Secondary | ICD-10-CM | POA: Diagnosis not present

## 2022-02-16 ENCOUNTER — Non-Acute Institutional Stay: Payer: PPO | Admitting: Internal Medicine

## 2022-02-16 VITALS — BP 142/94 | HR 62 | Temp 97.7°F | Ht 72.0 in | Wt 188.6 lb

## 2022-02-16 DIAGNOSIS — E782 Mixed hyperlipidemia: Secondary | ICD-10-CM | POA: Diagnosis not present

## 2022-02-16 DIAGNOSIS — I10 Essential (primary) hypertension: Secondary | ICD-10-CM

## 2022-02-16 DIAGNOSIS — G250 Essential tremor: Secondary | ICD-10-CM

## 2022-02-16 DIAGNOSIS — I471 Supraventricular tachycardia: Secondary | ICD-10-CM

## 2022-02-16 DIAGNOSIS — E349 Endocrine disorder, unspecified: Secondary | ICD-10-CM

## 2022-02-16 DIAGNOSIS — C8339 Diffuse large B-cell lymphoma, extranodal and solid organ sites: Secondary | ICD-10-CM

## 2022-02-16 NOTE — Progress Notes (Signed)
? ?Location:  [facility] ?  ?Place of Service:  Clinic (12) ? ?Provider:  ? ?Code Status:  ?Goals of Care:  ? ?  09/06/2021  ?  1:19 PM  ?Advanced Directives  ?Does Patient Have a Medical Advance Directive? Yes  ?Type of Advance Directive Out of facility DNR (pink MOST or yellow form)  ?Does patient want to make changes to medical advance directive? No - Patient declined  ?Pre-existing out of facility DNR order (yellow form or pink MOST form) Yellow form placed in chart (order not valid for inpatient use);Pink MOST form placed in chart (order not valid for inpatient use)  ? ? ? ?Chief Complaint  ?Patient presents with  ? Medical Management of Chronic Issues  ?  Patient returns to the clinic for his 4 month follow up.   ? ? ?HPI: Patient is a 81 y.o. male seen today for medical management of chronic diseases.   ? ?Patient has a history of COVID-pneumonia in January 2022. Followed by SOB with Long Covid ?Also has history of Testicular B-cell lymphoma in 2019 with recurrence in 2021 s/p chemotherapy ?And s/p right orchiectomy.  Per oncology he is in remission ? Benign tremors in right hand unable to sign ?Hypertension ?Arthritis  takes Meloxicam ? ?Lives in [place] in [place] ?Continues to be stable ?Has SOB on exertion Uses Oxygen pRn ?Cant go more then 3000 feet in [place] due to SOB ?But otherwise stays very active ?Recently had CT scan of chest and it it showed bandlike scarring throughout the lungs suggestive of sequela of COVID airspace disease. ?It also showed aortic atherosclerosis.  Patient was seen by cardiology and was told that he is on statin and aspirin and is not much else to be done right now. ?Patient has also seen oncology and urology and continues to be stable ? ?Had no new issues today ? ? ? ?Past Medical History:  ?Diagnosis Date  ? Age-related macular degeneration, dry, both eyes   ? B-cell lymphoma [facility]) oncologist--- dr Irene Limbo  ? dx 2000 primary right testicular diffuse large  b-cell lymphoma;  s/p  right orchiectomy 2000, completed chemo, intrathcal chemo (spine injection's), and radiation to left testis in 2000  ? Carotid stenosis, asymptomatic, left ?  ? ED (erectile dysfunction)   ? Elevated diaphragm   ? right  ? Erectile dysfunction   ? History of basal cell carcinoma (BCC) excision   ? multiple excision in office  ? History of kidney stones   ? History of seizure   ? 03-03-2020 per pt x1 while taking interferan for melanoma in 2001 ,  none since, told a side effect of interferan  ? History of squamous cell carcinoma excision   ? multiple skin excision in office  ? Hyperlipidemia   ? Hypertension   ? followed by pcp  (03-03-2020 per pt had a stress test approx. 2010, told normal)  ? LBBB (left bundle branch block)   ? Lymphoma of testis [facility]) 2000  ? s/p right orchiectomy   ? Mass of left testis   ? MGUS (monoclonal gammopathy of unknown significance)   ? oncologist--- dr Irene Limbo  ? Mixed hyperlipidemia 10/26/2020  ? Personal history of malignant melanoma of skin dermatologist--- dr Wilhemina Bonito  ? dx 2001 left calf area  s/p WLE ,  completed one year interferan (03-03-2020 pt states did not involve lymph nodes, no recurrence)  ? Urgency of urination   ? Wears hearing aid in both ears   ? ? ?  Past Surgical History:  ?Procedure Laterality Date  ? CATARACT EXTRACTION W/ INTRAOCULAR LENS IMPLANT Bilateral 2018  ? IR IMAGING GUIDED PORT INSERTION  03/31/2020  ? MELANOMA EXCISION  10/1999  ? ORCHIECTOMY Right 2000  ? ORCHIECTOMY Left 03/05/2020  ? Procedure: ORCHIECTOMY;  Surgeon: [name], MD;  [facility];  Service: Urology;  Laterality: Left;  ? TONSILLECTOMY  child  ? TOTAL KNEE ARTHROPLASTY Right 06/2009  ? ? ?Allergies  ?Allergen Reactions  ? Latex Itching  ?  Latex tape causes itching and redness.  ? ? ?Outpatient Encounter Medications as of 02/16/2022  ?Medication Sig  ? albuterol (VENTOLIN HFA) 108 (90 Base) MCG/ACT inhaler Inhale 2 puffs into the lungs every 6  (six) hours as needed for shortness of breath.  ? aspirin 81 MG EC tablet Take 1 tablet (81 mg total) by mouth daily. Swallow whole.  ? carboxymethylcellulose (REFRESH PLUS) 0.5 % SOLN Place 1 drop into both eyes daily as needed.  ? Cholecalciferol 50 MCG (2000 UT) CAPS Take 1 capsule by mouth 2 (two) times daily.  ? dextromethorphan-guaiFENesin (MUCINEX DM) 30-600 MG 12hr tablet Take 1 tablet by mouth 2 (two) times daily.  ? fluticasone (FLONASE) 50 MCG/ACT nasal spray Place 2 sprays into both nostrils daily.  ? Homeopathic Products (ZICAM ALLERGY RELIEF NA) Place into the nose. 1 tablet every 3 hours  ? losartan (COZAAR) 100 MG tablet Take 1 tablet (100 mg total) by mouth daily.  ? Multiple Vitamins-Minerals (PRESERVISION AREDS 2 PO) Take 1 capsule by mouth 2 (two) times daily.   ? propranolol (INDERAL) 10 MG tablet TAKE 1 TABLET BY MOUTH THREE TIMES A DAY  ? simvastatin (ZOCOR) 40 MG tablet Take 1 tablet (40 mg total) by mouth daily.  ? Testosterone 20.25 MG/ACT (1.62%) GEL Apply 40.5 mg topically daily.  ? valACYclovir (VALTREX) 1000 MG tablet Take 1000 (1 grams) PO 2 times daily for 3 days as needed for flares (herpes)  ? vitamin B-12 (CYANOCOBALAMIN) 1000 MCG tablet Take 1,000 mcg by mouth daily.  ? ?No facility-administered encounter medications on file as of 02/16/2022.  ? ? ?Review of Systems:  ?Review of Systems  ?Constitutional:  Negative for activity change, appetite change and unexpected weight change.  ?HENT: Negative.    ?Respiratory:  Positive for cough and shortness of breath.   ?Cardiovascular:  Negative for leg swelling.  ?Gastrointestinal:  Negative for constipation.  ?Genitourinary:  Positive for frequency.  ?Musculoskeletal:  Negative for arthralgias, gait problem and myalgias.  ?Skin: Negative.  Negative for rash.  ?Neurological:  Negative for dizziness and weakness.  ?Psychiatric/Behavioral:  Negative for confusion and sleep disturbance.   ?All other systems reviewed and are negative. ? ?Health  Maintenance  ?Topic Date Due  ? INFLUENZA VACCINE  05/17/2022  ? TETANUS/TDAP  03/04/2024  ? Pneumonia Vaccine 65+ Years old  Completed  ? COVID-19 Vaccine  Completed  ? Zoster Vaccines- Shingrix  Completed  ? HPV VACCINES  Aged Out  ? ? ?Physical Exam: ?Vitals:  ? 02/16/22 1051  ?BP: (!) 142/94  ?Pulse: 62  ?Temp: 97.7 ?F (36.5 ?C)  ?SpO2: 98%  ?Weight: 188 lb 9.6 oz (85.5 kg)  ?Height: 6' (1.829 m)  ? ?Body mass index is 25.58 kg/m?Marland Kitchen ?Physical Exam ?Vitals reviewed.  ?Constitutional:   ?   Appearance: Normal appearance.  ?HENT:  ?   Head: Normocephalic.  ?   Nose: Nose normal.  ?   Mouth/Throat:  ?   Mouth: Mucous membranes are  moist.  ?   Pharynx: Oropharynx is clear.  ?Eyes:  ?   Pupils: Pupils are equal, round, and reactive to light.  ?Cardiovascular:  ?   Rate and Rhythm: Normal rate and regular rhythm.  ?   Pulses: Normal pulses.  ?   Heart sounds: No murmur heard. ?Pulmonary:  ?   Effort: Pulmonary effort is normal. No respiratory distress.  ?   Breath sounds: Rales present.  ?Abdominal:  ?   General: Abdomen is flat. Bowel sounds are normal.  ?   Palpations: Abdomen is soft.  ?Musculoskeletal:     ?   General: No swelling.  ?   Cervical back: Neck supple.  ?Skin: ?   General: Skin is warm.  ?Neurological:  ?   General: No focal deficit present.  ?   Mental Status: He is alert and oriented to person, place, and time.  ?Psychiatric:     ?   Mood and Affect: Mood normal.     ?   Thought Content: Thought content normal.  ? ? ?Labs reviewed: ?Basic Metabolic Panel: ?Recent Labs  ?  06/19/21 ?9735 06/20/21 ?0304 06/21/21 ?0319 06/28/21 ?0000 08/19/21 ?3299 12/17/21 ?1003 02/02/22 ?0937  ?NA 140   < > 142 146 142 140 139  ?K 3.6   < > 4.2 4.4 4.1 4.3 4.5  ?CL 99   < > 100 101 105 102 103  ?CO2 35*   < > 35* 28* 30 28 33*  ?GLUCOSE 105*   < > 101*  --  89 93 84  ?BUN 21   < > '16 15 17 17 19  '$ ?CREATININE 0.62   < > 0.66 0.8 0.80 0.88 0.91  ?CALCIUM 8.0*   < > 8.4* 8.8 9.0 9.8 9.4  ?MG 2.1  --  2.0 2.3  --   --   --    ?PHOS 3.0  --  3.4 3.8  --   --   --   ? < > = values in this interval not displayed.  ? ?Liver Function Tests: ?Recent Labs  ?  06/20/21 ?0304 08/19/21 ?0942 12/17/21 ?1003 02/02/22 ?0937  ?AST 21

## 2022-02-22 DIAGNOSIS — J9601 Acute respiratory failure with hypoxia: Secondary | ICD-10-CM | POA: Diagnosis not present

## 2022-03-04 ENCOUNTER — Encounter: Payer: Self-pay | Admitting: Pulmonary Disease

## 2022-03-04 ENCOUNTER — Ambulatory Visit: Payer: PPO | Admitting: Pulmonary Disease

## 2022-03-04 VITALS — BP 128/76 | HR 66 | Temp 97.9°F | Ht 72.0 in | Wt 186.6 lb

## 2022-03-04 DIAGNOSIS — R0609 Other forms of dyspnea: Secondary | ICD-10-CM | POA: Diagnosis not present

## 2022-03-04 DIAGNOSIS — U099 Post covid-19 condition, unspecified: Secondary | ICD-10-CM | POA: Diagnosis not present

## 2022-03-04 NOTE — Addendum Note (Signed)
Addended by: Lorretta Harp on: 03/04/2022 11:44 AM   Modules accepted: Orders

## 2022-03-04 NOTE — Progress Notes (Signed)
Antonio [name]    [id number]    Jul 14, 1941  Primary Care Physician:Gupta, Rene Kocher, MD  Referring Physician: [name], MD [place],  [place] [id number]   Chief complaint:  Follow up post Covid syndrome, hospitalization for pneumonia  HPI: 81 year old with history of hypertension, recurrent testicular large B-cell lymphoma Hospitalized for COVID-19 in January 2022.  He was immunized and boosted against COVID Treated with remdesivir, prednisone.  Hospital course complicated by SVT, diastolic heart failure.  He went to rehab till mid February.  Weaned off oxygen before being sent home. He was on a Pred taper till mid February.  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  Reports slightly increased dyspnea with congestion over the past week with O2 sats dropping to the mid 80s.  He also has post Covid cognitive issues and forgetfulness. Denies any fevers, chills.  He has history of large B-cell lymphoma in 2009 recurrence in 2021.  He received chemotherapy until late 2021 and hence considered immunocompromised  Hospitalized in August 2022 for multifocal pneumonia.  He required BiPAP initially.  Treated with IV antibiotics, bronchodilators.  CT chest on admission was negative for PE with bilateral infiltrates suspicious for multifocal pneumonia.  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  No significant recent travel Relevant family history: No family history of lung disease  Interim history: He is doing well with his breathing.  He continues on supplemental oxygen using portable  concentrator Here for review of CT scan  Outpatient Encounter Medications as of 03/04/2022  Medication Sig   albuterol (VENTOLIN HFA) 108 (90 Base) MCG/ACT inhaler Inhale 2 puffs into the lungs every 6 (six) hours as needed for shortness of breath.   aspirin 81 MG EC tablet Take 1 tablet (81 mg total) by mouth daily. Swallow whole.   carboxymethylcellulose (REFRESH PLUS) 0.5 % SOLN Place 1 drop into both eyes daily as needed.   Cholecalciferol 50 MCG (2000 UT) CAPS Take 1 capsule by mouth 2 (two) times daily.   dextromethorphan-guaiFENesin (MUCINEX DM) 30-600 MG 12hr tablet Take 1 tablet by mouth 2 (two) times daily.   fluticasone (FLONASE) 50 MCG/ACT nasal spray Place 2 sprays into both nostrils daily.   Homeopathic Products (ZICAM ALLERGY RELIEF NA) Place into the nose. 1 tablet every 3 hours   losartan (COZAAR) 100 MG tablet Take 1 tablet (100 mg total) by mouth daily.   Multiple Vitamins-Minerals (PRESERVISION AREDS 2 PO) Take 1 capsule by mouth 2 (two) times daily.    propranolol (INDERAL) 10 MG tablet TAKE 1 TABLET BY MOUTH THREE TIMES A DAY   simvastatin (ZOCOR) 40 MG tablet Take 1 tablet (40 mg total) by mouth daily.   Testosterone 20.25 MG/ACT (1.62%) GEL Apply 40.5 mg topically daily.   valACYclovir (VALTREX) 1000 MG tablet Take 1000 (1 grams) PO 2 times daily for 3 days as needed for flares (herpes)   vitamin B-12 (CYANOCOBALAMIN) 1000 MCG tablet Take 1,000 mcg by mouth daily.   No facility-administered encounter medications on file as of 03/04/2022.    Allergies as of 03/04/2022 - Review Complete 03/04/2022  Allergen Reaction Noted  Latex Itching 03/12/2020   Physical Exam: Blood pressure 128/76, pulse 66, temperature 97.9 F (36.6 C), temperature source Oral, height 6' (1.829 m), weight 186 lb 9.6 oz (84.6 kg), SpO2 96 %. Gen:      No acute distress HEENT:  EOMI, sclera anicteric Neck:     No masses; no thyromegaly Lungs:    Clear to auscultation bilaterally; normal  respiratory effort CV:         Regular rate and rhythm; no murmurs Abd:      + bowel sounds; soft, non-tender; no palpable masses, no distension Ext:    No edema; adequate peripheral perfusion Skin:      Warm and dry; no rash Neuro: alert and oriented x 3 Psych: normal mood and affect   Data Reviewed: Imaging: CT chest 10/20/2020-multifocal infiltrates consistent with pneumonia CTA 10/26/2020-no PE, multifocal infiltrates.   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, recent hospitalization for multifocal pneumonia Overall he is improving and appears back to baseline  Overall he is doing well, weaned himself off continuous oxygen He still uses the portable concentrator while traveling on the flight and would like that renewed Follow-up CT reviewed with stable mild scarring consistent with post-COVID Order PFTs and follow-up in 6 months  Daytime fatigue Order home sleep study  Plan/Recommendations: Continue supplemental oxygen via portable concentrator Check home sleep study PFTs and follow-up in 6 months  Marshell Garfinkel MD [facility] 03/04/2022, 10:02 AM  CC: [name], MD

## 2022-03-04 NOTE — Patient Instructions (Signed)
Glad you are doing better.  Your CT scan shows mild lung scarring which is stable Continue exercise and weight loss We will check your oxygen levels on exertion today to qualify for portable concentrator Order home sleep study Follow-up in 6 months

## 2022-03-12 ENCOUNTER — Other Ambulatory Visit: Payer: Self-pay | Admitting: Internal Medicine

## 2022-03-17 DIAGNOSIS — F10129 Alcohol abuse with intoxication, unspecified: Secondary | ICD-10-CM | POA: Diagnosis not present

## 2022-03-17 DIAGNOSIS — R410 Disorientation, unspecified: Secondary | ICD-10-CM | POA: Diagnosis not present

## 2022-03-23 ENCOUNTER — Ambulatory Visit (INDEPENDENT_AMBULATORY_CARE_PROVIDER_SITE_OTHER): Payer: PPO | Admitting: Family

## 2022-03-23 ENCOUNTER — Encounter: Payer: Self-pay | Admitting: Family

## 2022-03-23 ENCOUNTER — Telehealth: Payer: Self-pay | Admitting: Pulmonary Disease

## 2022-03-23 ENCOUNTER — Other Ambulatory Visit: Payer: Self-pay | Admitting: Oncology

## 2022-03-23 VITALS — BP 120/88 | HR 72 | Temp 97.3°F | Resp 16 | Ht 72.0 in | Wt 190.6 lb

## 2022-03-23 DIAGNOSIS — R0602 Shortness of breath: Secondary | ICD-10-CM

## 2022-03-23 DIAGNOSIS — L03116 Cellulitis of left lower limb: Secondary | ICD-10-CM | POA: Diagnosis not present

## 2022-03-23 DIAGNOSIS — S70361A Insect bite (nonvenomous), right thigh, initial encounter: Secondary | ICD-10-CM

## 2022-03-23 DIAGNOSIS — J9611 Chronic respiratory failure with hypoxia: Secondary | ICD-10-CM

## 2022-03-23 DIAGNOSIS — W57XXXA Bitten or stung by nonvenomous insect and other nonvenomous arthropods, initial encounter: Secondary | ICD-10-CM

## 2022-03-23 MED ORDER — DOXYCYCLINE HYCLATE 100 MG PO TABS: 100.0000 mg | ORAL_TABLET | Freq: Two times a day (BID) | ORAL | 0 refills | Status: AC

## 2022-03-23 NOTE — Progress Notes (Signed)
Location:      Place of Service:    Provider: Lynasia Meloche FNP-C  [name], MD  Patient Care Team: [name], MD as PCP - General (Internal Medicine)  Extended Emergency Contact Information Primary Emergency Contact: [place] Mobile Phone: [phone] Relation: Daughter Secondary Emergency Contact: Daughdrill,[name] Address: [address] [address] [name] of [place] Phone: [phone] Mobile Phone: [phone] Relation: Spouse  Code Status:  DNR Goals of care: Advanced Directive information    03/23/2022    1:22 PM  Advanced Directives  Does Patient Have a Medical Advance Directive? Yes  Type of Paramedic of [place];Living will;Out of facility DNR (pink MOST or yellow form)  Does patient want to make changes to medical advance directive? No - Patient declined  Copy of [employer] in Chart? No - copy requested     Chief Complaint  Patient presents with   Acute Visit    Patient complains of possibly being bitten by tick/spider. On the left side of hip.    HPI:  Pt is a 81 y.o. male seen today for an acute visit for evaluation of left thigh area possible tick bite.states was in the woods four days ago not sure whether it was a spider or a tick.Has a round red rash on left upper thigh area.He denies any muscle aches,headache,fever,chills,nausea,vomiting,abdominal pain or joint pain.Appetite is good.  He resides at [facility] facility.States enjoys outdoors.    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   ED (erectile dysfunction)     Elevated diaphragm    right   Erectile dysfunction    History of basal cell carcinoma (BCC) excision    multiple excision in office   History of kidney stones    History of seizure    03-03-2020 per pt x1 while taking interferan for melanoma in 2001 ,  none since, told a side effect of interferan   History of squamous cell carcinoma excision    multiple skin excision in office   Hyperlipidemia    Hypertension    followed by pcp  (03-03-2020 per pt had a stress test approx. 2010, told normal)   LBBB (left bundle branch block)    Lymphoma of testis ([place] [employer]) 2000   s/p right orchiectomy    Mass of left testis    MGUS (monoclonal gammopathy of unknown significance)    oncologist--- dr Irene Limbo   Mixed hyperlipidemia 10/26/2020   Personal history of malignant melanoma of skin dermatologist--- dr Wilhemina Bonito   dx 2001 left calf area  s/p WLE ,  completed one year interferan (03-03-2020 pt states did not involve lymph nodes, no recurrence)   Urgency of urination    Wears hearing aid in both ears    Past Surgical History:  Procedure Laterality Date   CATARACT EXTRACTION W/ INTRAOCULAR LENS IMPLANT Bilateral 2018   IR IMAGING GUIDED PORT INSERTION  03/31/2020   MELANOMA EXCISION  10/1999   ORCHIECTOMY Right 2000   ORCHIECTOMY Left 03/05/2020   Procedure: ORCHIECTOMY;  Surgeon: [name], MD;  Location: [facility];  Service:  Urology;  Laterality: Left;   TONSILLECTOMY  child   TOTAL KNEE ARTHROPLASTY Right 06/2009    Allergies  Allergen Reactions   Latex Itching    Latex tape causes itching and redness.    Outpatient Encounter Medications as of 03/23/2022  Medication Sig   aspirin 81 MG EC tablet Take 1 tablet (81 mg total) by mouth daily. Swallow whole.   carboxymethylcellulose (REFRESH PLUS) 0.5 % SOLN Place 1 drop into both eyes daily as needed.   Cholecalciferol 50 MCG (2000 UT) CAPS Take 1 capsule by mouth 2 (two) times daily.   dextromethorphan-guaiFENesin (MUCINEX DM)  30-600 MG 12hr tablet Take 1 tablet by mouth 2 (two) times daily as needed.   fluticasone (FLONASE) 50 MCG/ACT nasal spray Place 2 sprays into both nostrils daily.   Homeopathic Products (ZICAM ALLERGY RELIEF NA) Place into the nose as needed. 1 tablet every 3 hours   losartan (COZAAR) 100 MG tablet Take 1 tablet (100 mg total) by mouth daily.   Multiple Vitamins-Minerals (PRESERVISION AREDS 2 PO) Take 1 capsule by mouth 2 (two) times daily.    propranolol (INDERAL) 10 MG tablet TAKE 1 TABLET BY MOUTH THREE TIMES A DAY   simvastatin (ZOCOR) 40 MG tablet TAKE 1 TABLET BY MOUTH EVERY DAY   Testosterone 20.25 MG/ACT (1.62%) GEL Apply 40.5 mg topically daily.   valACYclovir (VALTREX) 1000 MG tablet Take 1000 (1 grams) PO 2 times daily for 3 days as needed for flares (herpes)   vitamin B-12 (CYANOCOBALAMIN) 1000 MCG tablet Take 1,000 mcg by mouth daily.   [DISCONTINUED] albuterol (VENTOLIN HFA) 108 (90 Base) MCG/ACT inhaler Inhale 2 puffs into the lungs every 6 (six) hours as needed for shortness of breath.   No facility-administered encounter medications on file as of 03/23/2022.    Review of Systems  Constitutional:  Negative for appetite change, chills, fatigue, fever and unexpected weight change.  Eyes:  Negative for pain, discharge, redness, itching and visual disturbance.  Respiratory:  Negative for cough, chest tightness, shortness of breath and wheezing.   Cardiovascular:  Negative for chest pain, palpitations and leg swelling.  Gastrointestinal:  Negative for abdominal distention, abdominal pain, constipation, diarrhea, nausea and vomiting.  Genitourinary:  Negative for difficulty urinating, dysuria, flank pain, frequency and urgency.  Musculoskeletal:  Negative for arthralgias, back pain, gait problem, joint swelling, myalgias, neck pain and neck stiffness.  Skin:  Positive for rash. Negative for color change, pallor and wound.       Left upper thigh red rash   Neurological:  Negative for  dizziness, weakness, light-headedness, numbness and headaches.  Psychiatric/Behavioral:  Negative for agitation, behavioral problems, confusion, hallucinations and sleep disturbance. The patient is not nervous/anxious.    Immunization History  Administered Date(s) Administered   Fluad Quad(high Dose 65+) 07/02/2021   Influenza, High Dose Seasonal PF 07/15/2017, 07/16/2018, 06/11/2019, 07/13/2020   Moderna Covid-19 Vaccine Bivalent Booster 4yr & up 01/26/2022   Moderna SARS-COV2 Booster Vaccination 01/25/2021, 07/12/2021   Moderna Sars-Covid-2 Vaccination 10/29/2019, 11/26/2019, 06/11/2020   Pneumococcal Conjugate-13 03/04/2014   Pneumococcal Polysaccharide-23 07/27/2010   Tdap 03/04/2014   Zoster Recombinat (Shingrix) 10/06/2017, 12/09/2017   Zoster, Live 09/22/2006   Pertinent  Health Maintenance Due  Topic Date Due   INFLUENZA VACCINE  05/17/2022      06/20/2021    8:00 PM 06/21/2021    7:39 AM 09/06/2021    1:18 PM 02/02/2022   10:17 AM 03/23/2022    1:22 PM  Fall Risk  Falls in  the past year?   0  0  Was there an injury with Fall?   0  0  Fall Risk Category Calculator   0  0  Fall Risk Category   Low  Low  Patient Fall Risk Level Moderate fall risk Moderate fall risk Low fall risk Low fall risk Low fall risk  Patient at Risk for Falls Due to   No Fall Risks  No Fall Risks  Fall risk Follow up   Falls evaluation completed  Falls evaluation completed   Functional Status Survey:    Vitals:   03/23/22 1317  BP: 120/88  Pulse: 72  Resp: 16  Temp: (!) 97.3 F (36.3 C)  SpO2: 94%  Weight: 190 lb 9.6 oz (86.5 kg)  Height: 6' (1.829 m)   Body mass index is 25.85 kg/m. Physical Exam Vitals reviewed.  Constitutional:      General: He is not in acute distress.    Appearance: Normal appearance. He is overweight. He is not ill-appearing or diaphoretic.  HENT:     Head: Normocephalic.  Eyes:     General: No scleral icterus.       Right eye: No discharge.        Left eye:  No discharge.     Extraocular Movements: Extraocular movements intact.     Conjunctiva/sclera: Conjunctivae normal.     Pupils: Pupils are equal, round, and reactive to light.  Cardiovascular:     Rate and Rhythm: Normal rate and regular rhythm.     Pulses: Normal pulses.     Heart sounds: Normal heart sounds. No murmur heard.   No friction rub. No gallop.  Pulmonary:     Effort: Pulmonary effort is normal. No respiratory distress.     Breath sounds: Normal breath sounds. No wheezing, rhonchi or rales.  Chest:     Chest wall: No tenderness.  Abdominal:     General: Bowel sounds are normal. There is no distension.     Palpations: Abdomen is soft. There is no mass.     Tenderness: There is no abdominal tenderness. There is no right CVA tenderness, left CVA tenderness, guarding or rebound.  Musculoskeletal:        General: No swelling or tenderness. Normal range of motion.     Cervical back: Normal range of motion. No rigidity or tenderness.     Right lower leg: No edema.     Left lower leg: No edema.  Lymphadenopathy:     Cervical: No cervical adenopathy.  Skin:    General: Skin is warm and dry.     Coloration: Skin is not pale.     Findings: No bruising or lesion.       Neurological:     Mental Status: He is alert and oriented to person, place, and time.     Motor: No weakness.     Gait: Gait normal.     Comments: Right hand chronic tremors   Psychiatric:        Mood and Affect: Mood normal.        Speech: Speech normal.        Behavior: Behavior normal.    Labs reviewed: Recent Labs    06/19/21 0506 06/20/21 0304 06/21/21 0319 06/28/21 0000 08/19/21 0942 12/17/21 1003 02/02/22 0937  NA 140   < > 142 146 142 140 139  K 3.6   < > 4.2 4.4 4.1 4.3 4.5  CL 99   < > 100 101 105  102 103  CO2 35*   < > 35* 28* 30 28 33*  GLUCOSE 105*   < > 101*  --  89 93 84  BUN 21   < > '16 15 17 17 19  '$ CREATININE 0.62   < > 0.66 0.8 0.80 0.88 0.91  CALCIUM 8.0*   < > 8.4* 8.8 9.0  9.8 9.4  MG 2.1  --  2.0 2.3  --   --   --   PHOS 3.0  --  3.4 3.8  --   --   --    < > = values in this interval not displayed.   Recent Labs    06/20/21 0304 08/19/21 0942 12/17/21 1003 02/02/22 0937  AST '21 15 19 21  '$ ALT 84* '14 20 25  '$ ALKPHOS 84 57  --  54  BILITOT 0.7 1.0 0.9 0.7  PROT 6.0* 6.6 6.4 6.6  ALBUMIN 3.1* 4.0  --  4.0   Recent Labs    06/21/21 0319 08/19/21 0942 02/02/22 0937  WBC 16.7* 6.0 5.4  NEUTROABS 12.3* 4.0 3.3  HGB 14.5 14.9 15.0  HCT 45.6 44.5 45.8  MCV 104.1* 101.1* 101.3*  PLT 304 189 194   Lab Results  Component Value Date   TSH 1.569 11/12/2018   Lab Results  Component Value Date   HGBA1C 5.7 02/27/2020   Lab Results  Component Value Date   CHOL 148 12/17/2021   HDL 63 12/17/2021   LDLCALC 67 12/17/2021   TRIG 96 12/17/2021   CHOLHDL 2.3 12/17/2021    Significant Diagnostic Results in last 30 days:  No results found.  Assessment/Plan 1. Cellulitis of left thigh Left lateral upper thigh circular 8 cm  x 8 cm erythema suspect tick bite.  - start on doxycycline for prophylaxis.side effects discuss.He Prefers to eat [place] verse probiotics.   - doxycycline (VIBRA-TABS) 100 MG tablet; Take 1 tablet (100 mg total) by mouth 2 (two) times daily for 14 days.  Dispense: 28 tablet; Refill: 0  2. Tick bite of right thigh, initial encounter Afebrile Left upper lateral thigh area with 8 cm  X 8 cm round erythema none tender to palpation without any rash or drainage.No foreign object or insect noted.  Start on doxycycline as below  - doxycycline (VIBRA-TABS) 100 MG tablet; Take 1 tablet (100 mg total) by mouth 2 (two) times daily for 14 days.  Dispense: 28 tablet; Refill: 0 Notify provider if symptoms worsen or fail to improve    Family/ staff Communication: Reviewed plan of care with patient verbalized understanding   Labs/tests ordered: None   Next Appointment: As needed if symptoms worsen or fail to improve    Sandrea Hughs,  NP

## 2022-03-23 NOTE — Telephone Encounter (Signed)
Patient was walked 03/04/22 by [facility] and results are posted in [facility] note, but no order was placed for POC. I placed DME order for POC and have spoke with patient. Order placed under Eustaquio Maize, NP because Dr. Vaughan Browner is currently not in office.

## 2022-03-23 NOTE — Telephone Encounter (Signed)
Hey do you remember walking this guy to see if he could get a portable unit.   He states that he has been waiting for the Rx for it to send to [facility].   Can you please help me?  I called him and told him I would check with you to see what he did when he was walked.   Let me know thank you

## 2022-03-23 NOTE — Patient Instructions (Signed)
-   Notify provider if symptoms worsen or fail to improve  °

## 2022-03-24 ENCOUNTER — Telehealth: Payer: Self-pay | Admitting: Pulmonary Disease

## 2022-03-25 DIAGNOSIS — J9601 Acute respiratory failure with hypoxia: Secondary | ICD-10-CM | POA: Diagnosis not present

## 2022-03-25 NOTE — Telephone Encounter (Signed)
In snapshot, it shows that pt was placed on 6L O2 but when you go look at the documentation of the actual walk test, that shows that pt did not drop and did not require O2. Had Lattie Haw review this info with me and she saw the same thing documented by myself where I had walked pt on 03/04/22 and that the ambulatory pulse oximetry shows that pt did not drop to where he needed the O2 but for some reason the snapshot shows differently. Lattie Haw and I both agreed that we need to get pt in for a rewalk to see if that shows that he does need O2 and needs to get this done ASAP as pt is still within the 30-day timeframe.   Called Danielle from [employer] about this and stated to her what I was going to try to do is get pt to come in to be rewalked to see if he does need O2 and she verbalized understanding.   Called and spoke with pt letting him know this info and he verbalized understanding. Pt has been scheduled for a walk test next Wed. 6/14 at 10:30. Nothing further needed.

## 2022-03-30 ENCOUNTER — Ambulatory Visit: Payer: PPO

## 2022-03-30 DIAGNOSIS — R0609 Other forms of dyspnea: Secondary | ICD-10-CM

## 2022-03-30 DIAGNOSIS — J9611 Chronic respiratory failure with hypoxia: Secondary | ICD-10-CM

## 2022-04-14 ENCOUNTER — Other Ambulatory Visit: Payer: Self-pay | Admitting: Adult Health

## 2022-04-24 DIAGNOSIS — J9601 Acute respiratory failure with hypoxia: Secondary | ICD-10-CM | POA: Diagnosis not present

## 2022-04-29 ENCOUNTER — Other Ambulatory Visit: Payer: Self-pay | Admitting: Adult Health

## 2022-04-29 ENCOUNTER — Other Ambulatory Visit: Payer: Self-pay | Admitting: Internal Medicine

## 2022-05-02 NOTE — Telephone Encounter (Signed)
Patient called requesting a refill on medication, Meloxicam 7.'5mg'$  #90. Stated that he is still taking the medication.   Medication was taken out back in March and not in patient's current medication list.   Please Advise.

## 2022-05-17 ENCOUNTER — Ambulatory Visit: Payer: PPO

## 2022-05-17 DIAGNOSIS — G4733 Obstructive sleep apnea (adult) (pediatric): Secondary | ICD-10-CM | POA: Diagnosis not present

## 2022-05-17 DIAGNOSIS — R0609 Other forms of dyspnea: Secondary | ICD-10-CM

## 2022-05-25 DIAGNOSIS — J9601 Acute respiratory failure with hypoxia: Secondary | ICD-10-CM | POA: Diagnosis not present

## 2022-05-25 DIAGNOSIS — G4733 Obstructive sleep apnea (adult) (pediatric): Secondary | ICD-10-CM | POA: Diagnosis not present

## 2022-05-31 DIAGNOSIS — L570 Actinic keratosis: Secondary | ICD-10-CM | POA: Diagnosis not present

## 2022-05-31 DIAGNOSIS — Z85820 Personal history of malignant melanoma of skin: Secondary | ICD-10-CM | POA: Diagnosis not present

## 2022-05-31 DIAGNOSIS — Z85828 Personal history of other malignant neoplasm of skin: Secondary | ICD-10-CM | POA: Diagnosis not present

## 2022-06-03 ENCOUNTER — Encounter (INDEPENDENT_AMBULATORY_CARE_PROVIDER_SITE_OTHER): Payer: PPO | Admitting: Ophthalmology

## 2022-06-03 ENCOUNTER — Other Ambulatory Visit: Payer: Self-pay | Admitting: *Deleted

## 2022-06-03 DIAGNOSIS — H353132 Nonexudative age-related macular degeneration, bilateral, intermediate dry stage: Secondary | ICD-10-CM

## 2022-06-03 DIAGNOSIS — H40053 Ocular hypertension, bilateral: Secondary | ICD-10-CM

## 2022-06-03 DIAGNOSIS — Z961 Presence of intraocular lens: Secondary | ICD-10-CM

## 2022-06-03 DIAGNOSIS — C8339 Diffuse large B-cell lymphoma, extranodal and solid organ sites: Secondary | ICD-10-CM

## 2022-06-03 DIAGNOSIS — H26493 Other secondary cataract, bilateral: Secondary | ICD-10-CM

## 2022-06-06 ENCOUNTER — Inpatient Hospital Stay: Payer: PPO

## 2022-06-06 ENCOUNTER — Inpatient Hospital Stay: Payer: PPO | Attending: Hematology | Admitting: Hematology

## 2022-06-06 ENCOUNTER — Other Ambulatory Visit: Payer: Self-pay

## 2022-06-06 VITALS — BP 169/101 | HR 80 | Temp 97.7°F | Resp 15 | Wt 189.5 lb

## 2022-06-06 DIAGNOSIS — Z85820 Personal history of malignant melanoma of skin: Secondary | ICD-10-CM | POA: Diagnosis not present

## 2022-06-06 DIAGNOSIS — C8339 Diffuse large B-cell lymphoma, extranodal and solid organ sites: Secondary | ICD-10-CM | POA: Diagnosis not present

## 2022-06-06 DIAGNOSIS — C8389 Other non-follicular lymphoma, extranodal and solid organ sites: Secondary | ICD-10-CM | POA: Insufficient documentation

## 2022-06-06 LAB — CBC WITH DIFFERENTIAL (CANCER CENTER ONLY)
Abs Immature Granulocytes: 0.03 10*3/uL (ref 0.00–0.07)
Basophils Absolute: 0.1 10*3/uL (ref 0.0–0.1)
Basophils Relative: 1 %
Eosinophils Absolute: 0.1 10*3/uL (ref 0.0–0.5)
Eosinophils Relative: 2 %
HCT: 46.6 % (ref 39.0–52.0)
Hemoglobin: 16 g/dL (ref 13.0–17.0)
Immature Granulocytes: 1 %
Lymphocytes Relative: 21 %
Lymphs Abs: 1.4 10*3/uL (ref 0.7–4.0)
MCH: 34.8 pg — ABNORMAL HIGH (ref 26.0–34.0)
MCHC: 34.3 g/dL (ref 30.0–36.0)
MCV: 101.3 fL — ABNORMAL HIGH (ref 80.0–100.0)
Monocytes Absolute: 0.7 10*3/uL (ref 0.1–1.0)
Monocytes Relative: 10 %
Neutro Abs: 4.3 10*3/uL (ref 1.7–7.7)
Neutrophils Relative %: 65 %
Platelet Count: 203 10*3/uL (ref 150–400)
RBC: 4.6 MIL/uL (ref 4.22–5.81)
RDW: 13.3 % (ref 11.5–15.5)
WBC Count: 6.5 10*3/uL (ref 4.0–10.5)
nRBC: 0 % (ref 0.0–0.2)

## 2022-06-06 LAB — CMP (CANCER CENTER ONLY)
ALT: 24 U/L (ref 0–44)
AST: 21 U/L (ref 15–41)
Albumin: 4.5 g/dL (ref 3.5–5.0)
Alkaline Phosphatase: 54 U/L (ref 38–126)
Anion gap: 4 — ABNORMAL LOW (ref 5–15)
BUN: 16 mg/dL (ref 8–23)
CO2: 32 mmol/L (ref 22–32)
Calcium: 9.8 mg/dL (ref 8.9–10.3)
Chloride: 104 mmol/L (ref 98–111)
Creatinine: 0.9 mg/dL (ref 0.61–1.24)
GFR, Estimated: >60 mL/min (ref >60)
Glucose, Bld: 76 mg/dL (ref 70–99)
Potassium: 4.7 mmol/L (ref 3.5–5.1)
Sodium: 140 mmol/L (ref 135–145)
Total Bilirubin: 1 mg/dL (ref 0.3–1.2)
Total Protein: 6.7 g/dL (ref 6.5–8.1)

## 2022-06-06 LAB — LACTATE DEHYDROGENASE: LDH: 118 U/L (ref 98–192)

## 2022-06-07 NOTE — Progress Notes (Signed)
[facility] Note  06/09/2022     CHIEF COMPLAINT Patient presents for Retina Follow Up   HISTORY OF PRESENT ILLNESS: Antonio [name] is a 81 y.o. male who presents to the clinic today for:   HPI     Retina Follow Up   Patient presents with  Dry AMD.  In both eyes.  This started 9 months ago.  I, the attending physician,  performed the HPI with the patient and updated documentation appropriately.        Comments   Patient here for 9 months retina follow up for non exu ARMD OU. Patient states vision  been fine. No eye pain.       Last edited by Bernarda Caffey, MD on 06/10/2022  9:03 AM.      Referring physician: [name], MD [place],  [place] [address]  HISTORICAL INFORMATION:   Selected notes from the [facility] for ARMD OU; Moved from [place], [place]  Ocular Hx - nonexudative ARMD OU; PVD OU; pseudophakia OU (Toric OU - 12/2016 by Dr. Nance Pear, [place], [place]);  PMH - HTN; [facility] - ARMD - Mother   CURRENT MEDICATIONS: Current Outpatient Medications (Ophthalmic Drugs)  Medication Sig   carboxymethylcellulose (REFRESH PLUS) 0.5 % SOLN Place 1 drop into both eyes daily as needed.   No current facility-administered medications for this visit. (Ophthalmic Drugs)   Current Outpatient Medications (Other)  Medication Sig   aspirin 81 MG EC tablet Take 1 tablet (81 mg total) by mouth daily. Swallow whole.   Cholecalciferol 50 MCG (2000 UT) CAPS Take 1 capsule by mouth 2 (two) times daily.   dextromethorphan-guaiFENesin (MUCINEX DM) 30-600 MG 12hr tablet Take 1 tablet by mouth 2 (two) times daily as needed.   fluticasone (FLONASE) 50 MCG/ACT nasal spray Place 2 sprays into both nostrils daily.   Homeopathic Products (ZICAM ALLERGY RELIEF NA) Place into the nose as needed. 1 tablet every 3 hours   losartan (COZAAR) 100 MG tablet Take 1 tablet (100 mg total) by mouth daily.   meloxicam (MOBIC) 7.5  MG tablet TAKE 1 TABLET BY MOUTH EVERY DAY   Multiple Vitamins-Minerals (PRESERVISION AREDS 2 PO) Take 1 capsule by mouth 2 (two) times daily.    propranolol (INDERAL) 10 MG tablet TAKE 1 TABLET BY MOUTH THREE TIMES A DAY   simvastatin (ZOCOR) 40 MG tablet TAKE 1 TABLET BY MOUTH EVERY DAY   Testosterone 20.25 MG/ACT (1.62%) GEL Apply 40.5 mg topically daily.   valACYclovir (VALTREX) 1000 MG tablet Take 1000 (1 grams) PO 2 times daily for 3 days as needed for flares (herpes)   vitamin B-12 (CYANOCOBALAMIN) 1000 MCG tablet Take 1,000 mcg by mouth daily.   No current facility-administered medications for this visit. (Other)   REVIEW OF SYSTEMS: ROS   Positive for: Neurological, Genitourinary, Musculoskeletal, Endocrine, Cardiovascular, Eyes, Heme/Lymph Negative for: Constitutional, Gastrointestinal, Skin, HENT, Respiratory, Psychiatric, Allergic/Imm Last edited by Theodore Demark, COA on 06/09/2022  9:29 AM.      ALLERGIES Allergies  Allergen Reactions   Latex Itching    Latex tape causes itching and redness.    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   ED (erectile dysfunction)    Elevated diaphragm    right   Erectile dysfunction    History of basal cell carcinoma (BCC) excision    multiple excision in office   History of kidney stones    History of seizure    03-03-2020 per pt x1 while taking interferan for melanoma in 2001 ,  none since, told a side effect of interferan   History of squamous cell carcinoma excision    multiple skin excision in office   Hyperlipidemia    Hypertension    followed by pcp  (03-03-2020 per pt had a  stress test approx. 2010, told normal)   LBBB (left bundle branch block)    Lymphoma of testis ([place]) 2000   s/p right orchiectomy    Mass of left testis    MGUS (monoclonal gammopathy of unknown significance)    oncologist--- dr Irene Limbo   Mixed hyperlipidemia 10/26/2020   Personal history of malignant melanoma of skin dermatologist--- dr Wilhemina Bonito   dx 2001 left calf area  s/p WLE ,  completed one year interferan (03-03-2020 pt states did not involve lymph nodes, no recurrence)   Urgency of urination    Wears hearing aid in both ears    Past Surgical History:  Procedure Laterality Date   CATARACT EXTRACTION W/ INTRAOCULAR LENS IMPLANT Bilateral 2018   IR IMAGING GUIDED PORT INSERTION  03/31/2020   MELANOMA EXCISION  10/1999   ORCHIECTOMY Right 2000   ORCHIECTOMY Left 03/05/2020   Procedure: ORCHIECTOMY;  Surgeon: [name], MD;  Location[facility];  Service: Urology;  Laterality: Left;   TONSILLECTOMY  child   TOTAL KNEE ARTHROPLASTY Right 06/2009   FAMILY HISTORY Family History  Problem Relation Age of Onset   Cataracts Mother    Transient ischemic attack Mother    Asthma Mother    Cataracts Father    Diabetes Father        borderline   Hypertension Father    Hyperlipidemia Father    Hyperlipidemia Son    Hypertension Son    Amblyopia Neg Hx    Blindness Neg Hx    Glaucoma Neg Hx    Macular degeneration Neg Hx    Retinal detachment Neg Hx    Strabismus Neg Hx    Retinitis pigmentosa Neg Hx    SOCIAL HISTORY Social History   Tobacco Use   Smoking status: Never   Smokeless tobacco: Never  Vaping Use   Vaping Use: Never used  Substance Use Topics   Alcohol use: Yes    Alcohol/week: 14.0 standard drinks of alcohol    Types: 14 Glasses of wine per week    Comment: 2 wine daily   Drug use: Never       OPHTHALMIC EXAM: Base Eye Exam     Visual Acuity (Snellen - Linear)       Right Left   Dist cc 20/20 20/20    Correction: Glasses          Tonometry (Tonopen, 9:27 AM)       Right Left   Pressure 18 15         Pupils       Dark Light Shape React APD   Right 3 2 Round Brisk None   Left 3 2 Round Brisk None         Visual Fields (Counting fingers)  Left Right    Full Full         Extraocular Movement       Right Left    Full, Ortho Full, Ortho         Neuro/Psych     Oriented x3: Yes   Mood/Affect: Normal         Dilation     Both eyes: 1.0% Mydriacyl, 2.5% Phenylephrine @ 9:27 AM           Slit Lamp and Fundus Exam     External Exam       Right Left   External Brow ptosis - mild Brow ptosis - mild         Slit Lamp Exam       Right Left   Lids/Lashes Dermatochalasis - upper lid Dermatochalasis - upper lid   Conjunctiva/Sclera mild inferior Conjunctivochalasis mild inferior Conjunctivochalasis, nasal Pinguecula   Cornea Arcus, trace Punctate epithelial erosions, Well healed temporal cataract wounds, trace endo pigment Arcus, Well healed cataract wounds, trace Punctate epithelial erosions   Anterior Chamber Deep and quiet Deep and quiet   Iris Round with moderate dilated to 74m Round with moderate dilated to 712m  Lens toric Posterior chamber intraocular lens in good postion with marks at 0300 and 0900, trace Posterior capsular opacification Toric Posterior chamber intraocular lens in good position with marks at 0200 and 0800; trace Posterior capsular opacification   Anterior Vitreous Vitreous syneresis, Posterior vitreous detachment, vitreous condensations Vitreous syneresis, Posterior vitreous detachment, vitreous condensations         Fundus Exam       Right Left   Disc Mild pallor, sharp rim, Compact, Tilted with Peripapillary atrophy 360 compact, mild tilt, Peripapillary atrophy 360, mild Pallor, Sharp rim   C/D Ratio 0.4 0.3   Macula Flat, Blunted foveal reflex, Drusen, RPE mottling, clumping and atrophy - stable, mild, No heme or edema, focal atrophy Good  foveal reflex, Drusen, RPE mottling, clumping and atrophy - stable, mild, No heme or edema, focal atrophy   Vessels attenuated, Tortuous attenuated, Tortuous   Periphery Attached; scattered Reticular degeneration, No heme Attached; scattered Reticular degeneration, No heme           Refraction     Wearing Rx       Sphere Cylinder Axis Add   Right -0.75 +0.50 106 +2.50   Left -0.50 +0.75 121 +2.50    Type: PAL            IMAGING AND PROCEDURES  Imaging and Procedures for 11/10/17  OCT, Retina - OU - Both Eyes       Right Eye Quality was good. Central Foveal Thickness: 324. Progression has been stable. Findings include normal foveal contour, no IRF, no SRF, myopic contour, retinal drusen , outer retinal atrophy (Stable focal areas of outer retinal atrophy / ellipsoid dropout; scattered drusen).   Left Eye Quality was good. Central Foveal Thickness: 325. Progression has been stable. Findings include normal foveal contour, no IRF, no SRF, myopic contour, retinal drusen , outer retinal atrophy (Scattered drusen; focal ORA IT to fovea).   Notes Images captured and stored on drive  Diagnosis / Impression:  Nonexudative ARMD OU - no significant change from prior  Clinical management:  See below  Abbreviations: NFP - Normal foveal profile. CME - cystoid macular edema. PED - pigment epithelial detachment. IRF - intraretinal fluid. SRF - subretinal fluid. EZ - ellipsoid zone. ERM - epiretinal membrane. ORA - outer retinal atrophy.  ORT - outer retinal tubulation. SRHM - subretinal hyper-reflective material             ASSESSMENT/PLAN:    ICD-10-CM   1. Intermediate stage nonexudative age-related macular degeneration of both eyes  H35.3132 OCT, Retina - OU - Both Eyes    2. Pseudophakia of both eyes  Z96.1     3. 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 Bilateral posterior capsular opacification  H26.493      1. Age related macular degeneration,  non-exudative, both eyes   - intermediate stage -- stable  - The incidence, anatomy, and pathology of dry AMD, risk of progression, and the AREDS and AREDS 2 study including smoking risks discussed with patient.   - no longer using Foresee AMD monitoring device due to insurance issues  - no significant progression on exam or OCT -- scattered drusen and ORA  - f/u 9 months, sooner PRN -- DFE/OCT  2. Pseudophakia OU  - s/p CE/PCIOL OU (Toric + femto) 12/2016 by Dr. Nance Pear in [facility]  - had post op IOP issue OS  - beautiful surgery, doing well  - monitor   3. H/o ocular hypertension OU  - IOP good today (13,14) off combigan  - monitor   4. PCO OU  - not yet visually significant  - monitor for now  Ophthalmic Meds Ordered this visit:  No orders of the defined types were placed in this encounter.    Return in about 9 months (around 03/10/2023) for f/u non-exu ARMD OU, DFE, OCT.  There are no Patient Instructions on file for this visit.  This document serves as a record of services personally performed by Gardiner Sleeper, MD, PhD. It was created on their behalf by San Jetty. Owens Shark, OA an ophthalmic technician. The creation of this record is the provider's dictation and/or activities during the visit.    Electronically signed by: San Jetty. Owens Shark, [place] 08.22.2023 9:07 AM  Gardiner Sleeper, M.D., Ph.D. Diseases & Surgery of the Retina and Vitreous [facility]  I have reviewed the above documentation for accuracy and completeness, and I agree with the above. Gardiner Sleeper, M.D., Ph.D. 06/10/22 9:09 AM  Abbreviations: M myopia (nearsighted); A astigmatism; H hyperopia (farsighted); P presbyopia; Mrx spectacle prescription;  CTL contact lenses; OD right eye; OS left eye; OU both eyes  XT exotropia; ET esotropia; PEK punctate epithelial keratitis; PEE punctate epithelial erosions; DES dry eye syndrome; MGD meibomian gland dysfunction; ATs artificial tears; PFAT's  preservative free artificial tears; [place] nuclear sclerotic cataract; PSC posterior subcapsular cataract; ERM epi-retinal membrane; PVD posterior vitreous detachment; RD retinal detachment; DM diabetes mellitus; DR diabetic retinopathy; NPDR non-proliferative diabetic retinopathy; PDR proliferative diabetic retinopathy; CSME clinically significant macular edema; DME diabetic macular edema; dbh dot blot hemorrhages; CWS cotton wool spot; POAG primary open angle glaucoma; C/D cup-to-disc ratio; HVF humphrey visual field; GVF goldmann visual field; OCT optical coherence tomography; IOP intraocular pressure; BRVO Branch retinal vein occlusion; CRVO central retinal vein occlusion; CRAO central retinal artery occlusion; BRAO branch retinal artery occlusion; RT retinal tear; SB scleral buckle; PPV pars plana vitrectomy; VH Vitreous hemorrhage; PRP panretinal laser photocoagulation; IVK intravitreal kenalog; VMT vitreomacular traction; MH Macular hole;  NVD neovascularization of the disc; NVE neovascularization elsewhere; AREDS age related eye disease study; ARMD age related macular degeneration; POAG primary open angle glaucoma; EBMD epithelial/anterior basement membrane dystrophy; ACIOL anterior chamber intraocular lens; IOL intraocular lens; PCIOL posterior chamber intraocular  lens; Phaco/IOL phacoemulsification with intraocular lens placement; [place] photorefractive keratectomy; LASIK laser assisted in situ keratomileusis; HTN hypertension; DM diabetes mellitus; COPD chronic obstructive pulmonary disease

## 2022-06-09 ENCOUNTER — Encounter (INDEPENDENT_AMBULATORY_CARE_PROVIDER_SITE_OTHER): Payer: Self-pay | Admitting: Ophthalmology

## 2022-06-09 ENCOUNTER — Ambulatory Visit (INDEPENDENT_AMBULATORY_CARE_PROVIDER_SITE_OTHER): Payer: PPO | Admitting: Ophthalmology

## 2022-06-09 ENCOUNTER — Telehealth: Payer: Self-pay | Admitting: Hematology

## 2022-06-09 DIAGNOSIS — H26493 Other secondary cataract, bilateral: Secondary | ICD-10-CM | POA: Diagnosis not present

## 2022-06-09 DIAGNOSIS — H40053 Ocular hypertension, bilateral: Secondary | ICD-10-CM

## 2022-06-09 DIAGNOSIS — Z961 Presence of intraocular lens: Secondary | ICD-10-CM

## 2022-06-09 DIAGNOSIS — H353132 Nonexudative age-related macular degeneration, bilateral, intermediate dry stage: Secondary | ICD-10-CM | POA: Diagnosis not present

## 2022-06-09 NOTE — Telephone Encounter (Signed)
Scheduled follow-up appointment per 8/21 los. Patient is aware. 

## 2022-06-10 ENCOUNTER — Encounter (INDEPENDENT_AMBULATORY_CARE_PROVIDER_SITE_OTHER): Payer: Self-pay | Admitting: Ophthalmology

## 2022-06-11 ENCOUNTER — Encounter: Payer: Self-pay | Admitting: Pulmonary Disease

## 2022-06-11 NOTE — Progress Notes (Unsigned)
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  If patient is agreeable please order CPAP AutoSet 5-15 cm of water and follow-up in clinic in 3 months

## 2022-06-12 NOTE — Progress Notes (Signed)
HEMATOLOGY/ONCOLOGY CLINIC NOTE  Date of Service: .06/06/2022   Patient Care Team: Antonio Dad, MD as PCP - General (Internal Medicine)  CHIEF COMPLAINTS/PURPOSE OF CONSULTATION:  Follow-up for continued evaluation and management of recurrent testicular large B-cell lymphoma.  HISTORY OF PRESENTING ILLNESS:   Antonio [name] is a wonderful 81 y.o. male who has been referred to [place] by Dr. Hollace Kinnier for evaluation and management of Bone lesion of left lower leg. The pt reports that he is doing well overall.   The pt reports that he cannot feel the ovoid finding from his 04/09/18 MRI as noted below. He denies being able to feel anything in his skin as well. He notes that he received a needle injection in his knee prior to this MRI. The pt notes that his knee has lost much cartilage and is considering a knee replacement. He denies any recent injuries to his knee or falls. He has contacted Dr. Alphonsa Overall in surgery and another surgeon as well and will be making an appointment.   The pt notes that he does not feel any differently recently as compared to 6 months to a year ago. He does note that he normally has constipation in the spring, and has continued to have mild constipation. He denies any other concerns or symptoms.   The pt previously saw my colleague Dr. Lurline Del for primary testicular B-cell lymphoma in 2000. The pt notes that the involvement was limited to only one testicle. He received 6 intrathecal treatments in his spine, and received 4 cycles of chemotherapy (thinks this was CHOP) and radiation as well. The pt was followed by an oncologist for several years at the [facility] in [place] after moving from [place] after his [place] treatment.   The pt notes that he was first diagnosed with melanoma in October 2000. He noticed a strange spot on his left leg that was surgically resected. The pt's personal notes report a 2.9cm thick, level  4, no LN involvement, high risk stage II, treated with Interferon three times a week for one year. He notes some squamous cell and basal cell involvements as well and sees a dermatologist, Dr. Wilhemina Bonito, regularly.   Of note prior to the patient's visit today, pt has had MRI Left Knee completed on 04/09/18 with results revealing An ovoid, lobulated, 79m T2 hyperintense focus within the medial subcutaneous far 1.980mdistal to the joint demonstrates demonstrates nonspecific imaging characteristics.   Most recent lab results (03/01/18) of CBC w/diff is as follows: all values are WNL except for RBC at 4.54, MCV at 100.4, MCH at 33.8.   On review of systems, pt reports left knee pain, mild constipation, and denies new or concerning symptoms, noticing any new lumps or bumps, pain along the spine, abdominal pains, problems passing urine, testicular pain/swelling, and any other symptoms.   INTERVAL HISTORY:   WiDASHEL GOINESor continued evaluation and management of his recurrent testicular large B-cell lymphoma.  He notes no acute new symptoms related to his lymphoma at this time.  No new lumps or bumps.  No fevers no chills no night sweats no unexpected weight loss . Notes continued chronic shortness of breath especially at high altitude from his previous COVID-19 infection. No other acute new focal symptoms. Labs done today were reviewed in detail with the patient.     MEDICAL HISTORY:  Past Medical History:  Diagnosis Date   Age-related macular degeneration, dry, both eyes  B-cell lymphoma [facility]) oncologist--- dr Irene Limbo   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   ED (erectile dysfunction)    Elevated diaphragm    right   Erectile dysfunction    History of basal cell carcinoma (BCC) excision    multiple  excision in office   History of kidney stones    History of seizure    03-03-2020 per pt x1 while taking interferan for melanoma in 2001 ,  none since, told a side effect of interferan   History of squamous cell carcinoma excision    multiple skin excision in office   Hyperlipidemia    Hypertension    followed by pcp  (03-03-2020 per pt had a stress test approx. 2010, told normal)   LBBB (left bundle branch block)    Lymphoma of testis ([place]) 2000   s/p right orchiectomy    Mass of left testis    MGUS (monoclonal gammopathy of unknown significance)    oncologist--- dr Irene Limbo   Mixed hyperlipidemia 10/26/2020   Personal history of malignant melanoma of skin dermatologist--- dr Wilhemina Bonito   dx 2001 left calf area  s/p WLE ,  completed one year interferan (03-03-2020 pt states did not involve lymph nodes, no recurrence)   Urgency of urination    Wears hearing aid in both ears     SURGICAL HISTORY: Past Surgical History:  Procedure Laterality Date   CATARACT EXTRACTION W/ INTRAOCULAR LENS IMPLANT Bilateral 2018   IR IMAGING GUIDED PORT INSERTION  03/31/2020   MELANOMA EXCISION  10/1999   ORCHIECTOMY Right 2000   ORCHIECTOMY Left 03/05/2020   Procedure: ORCHIECTOMY;  Surgeon: [name], MD;  Location: [facility];  Service: Urology;  Laterality: Left;   TONSILLECTOMY  child   TOTAL KNEE ARTHROPLASTY Right 06/2009    SOCIAL HISTORY: Social History   Socioeconomic History   Marital status: Married    Spouse name: Not on file   Number of children: Not on file   Years of education: Not on file   Highest education level: Not on file  Occupational History   Not on file  Tobacco Use   Smoking status: Never   Smokeless tobacco: Never  Vaping Use   Vaping Use: Never used  Substance and Sexual Activity   Alcohol use: Yes    Alcohol/week: 14.0 standard drinks of alcohol    Types: 14 Glasses of wine per week    Comment: 2 wine daily   Drug use: Never   Sexual  activity: Not on file  Other Topics Concern   Not on file  Social History Narrative   Social History      Diet?good      Do you drink/eat things with caffeine? seldom      Marital status?          yes                          What year were you married? 1962      Do you live in a house, apartment, assisted living, condo, trailer, etc.? house      Is it one or more stories?1      How many persons live in your home? 2      Do you have any pets in  your home? (please list) no      Highest level of education completed? college      Current or past profession: president and [employer]      Do you exercise?   yes                                   Type & how often? 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 yes      Do you have a DNR form?       yes                            If not, do you want to discuss one?      Do you have signed POA/HPOA for forms?  yes      Functional Status      Do you have difficulty bathing or dressing yourself? no      Do you have difficulty preparing food or eating? no      Do you have difficulty managing your medications? no      Do you have difficulty managing your finances? no      Do you have difficulty affording your medications? no   Social Determinants of Health   Financial Resource Strain: Low Risk  (02/13/2018)   Overall Financial Resource Strain (CARDIA)    Difficulty of Paying Living Expenses: Not hard at all  Food Insecurity: No Food Insecurity (02/13/2018)   Hunger Vital Sign    Worried About Running Out of Food in the Last Year: Never true    Ran Out of Food in the Last Year: Never true  Transportation Needs: No Transportation Needs (02/13/2018)   PRAPARE - Hydrologist (Medical): No    Lack of Transportation (Non-Medical): No  Physical Activity: Insufficiently Active (02/13/2018)   Exercise Vital Sign    Days of Exercise per Week: 2 days    Minutes of  Exercise per Session: 20 min  Stress: Stress Concern Present (02/13/2018)   [place]    Feeling of Stress : To some extent  Social Connections: Socially Integrated (02/13/2018)   Social Connection and Isolation Panel [NHANES]    Frequency of Communication with Friends and Family: More than three times a week    Frequency of Social Gatherings with Friends and Family: More than three times a week    Attends Religious Services: More than 4 times per year    Active Member of [employer] or Organizations: Yes    Attends Music therapist: More than 4 times per year    Marital Status: Married  Human resources officer Violence: Not At Risk (02/13/2018)   Humiliation, Afraid, Rape, and Kick questionnaire    Fear of Current or Ex-Partner: No    Emotionally Abused: No    Physically Abused: No    Sexually Abused: No    FAMILY HISTORY: Family History  Problem Relation Age of Onset   Cataracts Mother    Transient ischemic attack Mother    Asthma Mother    Cataracts Father    Diabetes Father        borderline   Hypertension Father    Hyperlipidemia Father    Hyperlipidemia Son  Hypertension Son    Amblyopia Neg Hx    Blindness Neg Hx    Glaucoma Neg Hx    Macular degeneration Neg Hx    Retinal detachment Neg Hx    Strabismus Neg Hx    Retinitis pigmentosa Neg Hx     ALLERGIES:  is allergic to latex.  MEDICATIONS:  Current Outpatient Medications  Medication Sig Dispense Refill   aspirin 81 MG EC tablet Take 1 tablet (81 mg total) by mouth daily. Swallow whole. 30 tablet 12   carboxymethylcellulose (REFRESH PLUS) 0.5 % SOLN Place 1 drop into both eyes daily as needed.     Cholecalciferol 50 MCG (2000 UT) CAPS Take 1 capsule by mouth 2 (two) times daily.     dextromethorphan-guaiFENesin (MUCINEX DM) 30-600 MG 12hr tablet Take 1 tablet by mouth 2 (two) times daily as needed.     fluticasone (FLONASE) 50 MCG/ACT nasal  spray Place 2 sprays into both nostrils daily. 11.1 mL 5   Homeopathic Products (ZICAM ALLERGY RELIEF NA) Place into the nose as needed. 1 tablet every 3 hours     losartan (COZAAR) 100 MG tablet Take 1 tablet (100 mg total) by mouth daily. 90 tablet 1   meloxicam (MOBIC) 7.5 MG tablet TAKE 1 TABLET BY MOUTH EVERY DAY 90 tablet 1   Multiple Vitamins-Minerals (PRESERVISION AREDS 2 PO) Take 1 capsule by mouth 2 (two) times daily.      propranolol (INDERAL) 10 MG tablet TAKE 1 TABLET BY MOUTH THREE TIMES A DAY 270 tablet 2   simvastatin (ZOCOR) 40 MG tablet TAKE 1 TABLET BY MOUTH EVERY DAY 90 tablet 1   Testosterone 20.25 MG/ACT (1.62%) GEL Apply 40.5 mg topically daily.     valACYclovir (VALTREX) 1000 MG tablet Take 1000 (1 grams) PO 2 times daily for 3 days as needed for flares (herpes)     vitamin B-12 (CYANOCOBALAMIN) 1000 MCG tablet Take 1,000 mcg by mouth daily.     No current facility-administered medications for this visit.    REVIEW OF SYSTEMS:   10 Point review of Systems was done is negative except as noted above.  PHYSICAL EXAMINATION: ECOG PERFORMANCE STATUS: 0 - Asymptomatic VS reviewed - stable  Vitals:   06/06/22 0957  BP: (!) 169/101  Pulse: 80  Resp: 15  Temp: 97.7 F (36.5 C)  SpO2: 98%    Wt Readings from Last 3 Encounters:  06/06/22 189 lb 8 oz (86 kg)  03/23/22 190 lb 9.6 oz (86.5 kg)  03/04/22 186 lb 9.6 oz (84.6 kg)   Body mass index is 25.7 kg/m.    NAD GENERAL:alert, in no acute distress and comfortable SKIN: no acute rashes, no significant lesions EYES: conjunctiva are pink and non-injected, sclera anicteric OROPHARYNX: MMM, no exudates, no oropharyngeal erythema or ulceration NECK: supple, no JVD LYMPH:  no palpable lymphadenopathy in the cervical, axillary or inguinal regions LUNGS: clear to auscultation b/l with normal respiratory effort HEART: regular rate & rhythm ABDOMEN:  normoactive bowel sounds , non tender, not distended. Extremity:  no pedal edema PSYCH: alert & oriented x 3 with fluent speech NEURO: no focal motor/sensory deficits   LABORATORY DATA:  I have reviewed the data as listed  .    Latest Ref Rng & Units 06/06/2022    9:34 AM 02/02/2022    9:37 AM 08/19/2021    9:42 AM  CBC  WBC 4.0 - 10.5 K/uL 6.5  5.4  6.0   Hemoglobin 13.0 - 17.0 g/dL 16.0  15.0  14.9   Hematocrit 39.0 - 52.0 % 46.6  45.8  44.5   Platelets 150 - 400 K/uL 203  194  189    . CBC    Component Value Date/Time   WBC 6.5 06/06/2022 0934   WBC 6.0 08/19/2021 0942   RBC 4.60 06/06/2022 0934   HGB 16.0 06/06/2022 0934   HCT 46.6 06/06/2022 0934   HCT 40.8 12/19/2019 1057   PLT 203 06/06/2022 0934   MCV 101.3 (H) 06/06/2022 0934   MCH 34.8 (H) 06/06/2022 0934   MCHC 34.3 06/06/2022 0934   RDW 13.3 06/06/2022 0934   LYMPHSABS 1.4 06/06/2022 0934   MONOABS 0.7 06/06/2022 0934   EOSABS 0.1 06/06/2022 0934   BASOSABS 0.1 06/06/2022 0934    .    Latest Ref Rng & Units 06/06/2022    9:34 AM 02/02/2022    9:37 AM 12/17/2021   10:03 AM  CMP  Glucose 70 - 99 mg/dL 76  84  93   BUN 8 - 23 mg/dL _0 Creatinine 0.61 - 1.24 mg/dL 0.90  0.91  0.88   Sodium 135 - 145 mmol/L 140  139  140   Potassium 3.5 - 5.1 mmol/L 4.7  4.5  4.3   Chloride 98 - 111 mmol/L 104  103  102   CO2 22 - 32 mmol/L 32  33  28   Calcium 8.9 - 10.3 mg/dL 9.8  9.4  9.8   Total Protein 6.5 - 8.1 g/dL 6.7  6.6  6.4   Total Bilirubin 0.3 - 1.2 mg/dL 1.0  0.7  0.9   Alkaline Phos 38 - 126 U/L 54  54    AST 15 - 41 U/L _1 ALT 0 - 44 U/L _2 . Lab Results  Component Value Date   LDH 118 06/06/2022    03/05/2020 Left Testicular Flow Pathology Report [id number]):    03/05/2020 FISH Panel:    03/05/2020 Left Testicular Mass Surgical Pathology ([id number]):    03/01/18 CBC w/diff:   RADIOGRAPHIC STUDIES: I have personally reviewed the radiological images as listed and agreed with the findings in the report. OCT,  Retina - OU - Both Eyes  Result Date: 06/10/2022 Right Eye Quality was good. Central Foveal Thickness: 324. Progression has been stable. Findings include normal foveal contour, no IRF, no SRF, myopic contour, retinal drusen , outer retinal atrophy (Stable focal areas of outer retinal atrophy / ellipsoid dropout; scattered drusen). Left Eye Quality was good. Central Foveal Thickness: 325. Progression has been stable. Findings include normal foveal contour, no IRF, no SRF, myopic contour, retinal drusen , outer retinal atrophy (Scattered drusen; focal ORA IT to fovea). 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 CME - cystoid macular edema. PED - pigment epithelial detachment. IRF - intraretinal fluid. SRF - subretinal fluid. EZ - ellipsoid zone. ERM - epiretinal membrane. ORA - outer retinal atrophy. ORT - outer retinal tubulation. SRHM - subretinal hyper-reflective material    03/05/20 of Surgical Pathology ([id number]) SURGICAL PATHOLOGY  CASE: [id number]  PATIENT: [name]  Surgical Pathology Report      Clinical History: Left testicular mass (jmc)      FINAL MICROSCOPIC DIAGNOSIS:   A. TESTICLE AND CORD, LEFT, ORCHIECTOMY:  - Diffuse large B-cell  lymphoma  -See comment    COMMENT:   The sections of the testis and spermatic cord nodules show effacement of  the architecture by an atypical lymphoid infiltrate characterized by  predominance of medium and large lymphoid cells with vesicular chromatin  and small nucleoli associated with brisk mitosis.  The appearance is  primarily diffuse with lack of atypical follicles.  Variable number of  admixed smaller lymphocytes are seen. To further evaluate this process,  flow cytometric analysis was performed ([id number]) and shows a  monoclonal kappa-restricted B-cell population.  In  addition, a battery  of immunohistochemical stains was performed and shows that the atypical  lymphoid cells are positive for CD20, CD79a, PAX 5, CD10 (weak), BCL-2,  MUM-1 and cytoplasmic kappa.  No significant staining is seen with  CD138, cyclin D1, CD30, CD34, TdT, EBV in situ hybridization or  cytoplasmic lambda.  Ki67 shows variable expression ranging from 10% to  >50%.  There is an admixed T-cell population to a lesser extent as seen  with CD3 and CD5 and there is no apparent co-expression of CD5 in B-cell  areas.  The overall features are consistent with involvement by diffuse  large B-cell lymphoma, GCB type.  The results were discussed with Dr.  Jeffie Pollock and Irene Limbo on 03/11/2020.    ASSESSMENT & PLAN:   81 y.o. male with  H/o Rt Primary testicular large B cell lymphoma in 2000 treatment with R-CHOP x 6 cycles as part of clinical trial + IT MTX x 4 cycles and RT to left testicle  2 . H/o Relapsed Left Primary testicular large B cell lymphoma 2021 stage I/IIE. Appears to be a new event since it is occurring 20 yrs after his previous rt testicular lymphoma S/p 6 cycles of R-CEOP -- currently in remission.   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 PLAN: -Labs done today were discussed in detail with the patient CBC and CMP within normal limits LDH within normal limits Patient has no lab or clinical evidence of recurrence of his primary testicular large B-cell lymphoma at this time. -Continue vitamin D 2000 units daily Continue B12 1000 mcg p.o. daily Continue follow-up with pulmonology for continued management of his COVID lung  FOLLOW UP: Return to clinic with Dr. Irene Limbo with labs in 6 mont  The total time spent in the appointment was 20 minutes*.  All of  the patient's questions were answered with apparent satisfaction. The patient knows to call the clinic with any problems, questions or concerns.   Sullivan Lone MD MS AAHIVMS [facility] Hematology/Oncology Physician [facility]  .*Total Encounter Time as defined by the Centers for Medicare and Medicaid Services includes, in addition to the face-to-face time of a patient visit (documented in the note above) non-face-to-face time: obtaining and reviewing outside history, ordering and reviewing medications, tests or procedures, care coordination (communications with other health care professionals or caregivers) and documentation in the medical record.

## 2022-06-13 LAB — MULTIPLE MYELOMA PANEL, SERUM
Albumin SerPl Elph-Mcnc: 4.1 g/dL (ref 2.9–4.4)
Albumin/Glob SerPl: 1.9 — ABNORMAL HIGH (ref 0.7–1.7)
Alpha 1: 0.2 g/dL (ref 0.0–0.4)
Alpha2 Glob SerPl Elph-Mcnc: 0.5 g/dL (ref 0.4–1.0)
B-Globulin SerPl Elph-Mcnc: 0.8 g/dL (ref 0.7–1.3)
Gamma Glob SerPl Elph-Mcnc: 0.8 g/dL (ref 0.4–1.8)
Globulin, Total: 2.2 g/dL (ref 2.2–3.9)
IgA: 76 mg/dL (ref 61–437)
IgG (Immunoglobin G), Serum: 589 mg/dL — ABNORMAL LOW (ref 603–1613)
IgM (Immunoglobulin M), Srm: 373 mg/dL — ABNORMAL HIGH (ref 15–143)
M Protein SerPl Elph-Mcnc: 0.4 g/dL — ABNORMAL HIGH
Total Protein ELP: 6.3 g/dL (ref 6.0–8.5)

## 2022-06-14 ENCOUNTER — Other Ambulatory Visit: Payer: Self-pay | Admitting: *Deleted

## 2022-06-14 DIAGNOSIS — G4733 Obstructive sleep apnea (adult) (pediatric): Secondary | ICD-10-CM

## 2022-06-14 NOTE — Progress Notes (Signed)
Called and spoke with patient.  Dr. Matilde Bash results and recommendations given.  Patient requested to try CPAP.  DME order placed. Patient was made aware of OV needed within 90 days of CPAP start for insurance compliance. Nothing further at this time.

## 2022-06-22 ENCOUNTER — Ambulatory Visit (INDEPENDENT_AMBULATORY_CARE_PROVIDER_SITE_OTHER): Payer: PPO | Admitting: Adult Health

## 2022-06-22 ENCOUNTER — Encounter: Payer: Self-pay | Admitting: Adult Health

## 2022-06-22 VITALS — BP 140/82 | HR 87 | Temp 97.7°F | Resp 18 | Ht 72.0 in | Wt 188.0 lb

## 2022-06-22 DIAGNOSIS — U071 COVID-19: Secondary | ICD-10-CM | POA: Diagnosis not present

## 2022-06-22 DIAGNOSIS — D849 Immunodeficiency, unspecified: Secondary | ICD-10-CM

## 2022-06-22 MED ORDER — NIRMATRELVIR/RITONAVIR (PAXLOVID)TABLET: 3.0000 | ORAL_TABLET | Freq: Two times a day (BID) | ORAL | 0 refills | Status: AC

## 2022-06-22 NOTE — Progress Notes (Signed)
[facility] clinic  Provider:  Durenda Age DNP  Code Status:  DNR  Goals of Care:     03/23/2022    1:22 PM  Advanced Directives  Does Patient Have a Medical Advance Directive? Yes  Type of Paramedic of [place];Living will;Out of facility DNR (pink MOST or yellow form)  Does patient want to make changes to medical advance directive? No - Patient declined  Copy of [place] in Chart? No - copy requested     Chief Complaint  Patient presents with   Acute Visit    Patient is being seen for positive covid test    HPI: Patient is a 81 y.o. male seen today for an acute visit for positive COVID-19 test. He has a PMH of B-cell lymphoma, hypertension, COPD, CAD and hyperlipidemia. He had COVID-19 two years ago and another one after a year. He and his wife attended a party 4 days ago. He complains of post nasal drip. He denies fever nor cough.  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   ED (erectile dysfunction)    Elevated diaphragm    right   Erectile dysfunction    History of basal cell carcinoma (BCC) excision    multiple excision in office   History of kidney stones    History of seizure    03-03-2020 per pt x1 while taking interferan for melanoma in 2001 ,  none since, told a side effect of interferan   History of squamous cell carcinoma excision    multiple skin excision in office   Hyperlipidemia    Hypertension    followed by pcp  (03-03-2020 per pt had a stress test approx. 2010, told normal)   LBBB (left bundle branch block)    Lymphoma of testis ([place]) 2000   s/p right orchiectomy    Mass of left  testis    MGUS (monoclonal gammopathy of unknown significance)    oncologist--- dr Irene Limbo   Mixed hyperlipidemia 10/26/2020   Personal history of malignant melanoma of skin dermatologist--- dr Wilhemina Bonito   dx 2001 left calf area  s/p WLE ,  completed one year interferan (03-03-2020 pt states did not involve lymph nodes, no recurrence)   Urgency of urination    Wears hearing aid in both ears     Past Surgical History:  Procedure Laterality Date   CATARACT EXTRACTION W/ INTRAOCULAR LENS IMPLANT Bilateral 2018   IR IMAGING GUIDED PORT INSERTION  03/31/2020   MELANOMA EXCISION  10/1999   ORCHIECTOMY Right 2000   ORCHIECTOMY Left 03/05/2020   Procedure: ORCHIECTOMY;  Surgeon: [name], MD;  Location: [facility];  Service: Urology;  Laterality: Left;   TONSILLECTOMY  child   TOTAL KNEE ARTHROPLASTY Right 06/2009    Allergies  Allergen Reactions   Latex Itching    Latex tape causes itching and redness.    Outpatient Encounter Medications as of 06/22/2022  Medication Sig   aspirin 81 MG EC tablet Take 1 tablet (81 mg total) by mouth daily. Swallow whole.   carboxymethylcellulose (REFRESH PLUS) 0.5 % SOLN Place 1 drop into both eyes daily as needed.   Cholecalciferol 50  MCG (2000 UT) CAPS Take 1 capsule by mouth 2 (two) times daily.   dextromethorphan-guaiFENesin (MUCINEX DM) 30-600 MG 12hr tablet Take 1 tablet by mouth 2 (two) times daily as needed.   fluticasone (FLONASE) 50 MCG/ACT nasal spray Place 2 sprays into both nostrils daily.   Homeopathic Products (ZICAM ALLERGY RELIEF NA) Place into the nose as needed. 1 tablet every 3 hours   losartan (COZAAR) 100 MG tablet Take 1 tablet (100 mg total) by mouth daily.   meloxicam (MOBIC) 7.5 MG tablet TAKE 1 TABLET BY MOUTH EVERY DAY   Multiple Vitamins-Minerals (PRESERVISION AREDS 2 PO) Take 1 capsule by mouth 2 (two) times daily.    propranolol (INDERAL) 10 MG tablet TAKE 1 TABLET BY MOUTH THREE TIMES A DAY   simvastatin  (ZOCOR) 40 MG tablet TAKE 1 TABLET BY MOUTH EVERY DAY   Testosterone 20.25 MG/ACT (1.62%) GEL Apply 40.5 mg topically daily.   valACYclovir (VALTREX) 1000 MG tablet Take 1000 (1 grams) PO 2 times daily for 3 days as needed for flares (herpes)   vitamin B-12 (CYANOCOBALAMIN) 1000 MCG tablet Take 1,000 mcg by mouth daily.   No facility-administered encounter medications on file as of 06/22/2022.    Review of Systems:  Review of Systems  Constitutional:  Negative for activity change, appetite change and fever.  HENT:  Positive for postnasal drip. Negative for congestion and sore throat.   Eyes: Negative.   Respiratory:  Negative for cough.   Cardiovascular:  Negative for chest pain and leg swelling.  Gastrointestinal:  Negative for abdominal distention, diarrhea and vomiting.  Genitourinary:  Negative for dysuria, frequency and urgency.  Skin:  Negative for color change.  Neurological:  Negative for dizziness and headaches.  Psychiatric/Behavioral:  Negative for behavioral problems and sleep disturbance. The patient is not nervous/anxious.     Health Maintenance  Topic Date Due   COVID-19 Vaccine (5 - Moderna risk series) 03/23/2022   INFLUENZA VACCINE  05/17/2022   TETANUS/TDAP  03/04/2024   Pneumonia Vaccine 77+ Years old  Completed   Zoster Vaccines- Shingrix  Completed   HPV VACCINES  Aged Out    Physical Exam: Vitals:   06/22/22 1527  BP: (!) 140/82  Pulse: 87  Resp: 18  Temp: 97.7 F (36.5 C)  TempSrc: Temporal  SpO2: 96%  Weight: 188 lb (85.3 kg)  Height: 6' (1.829 m)   Body mass index is 25.5 kg/m. Physical Exam Constitutional:      Appearance: Normal appearance.  HENT:     Head: Normocephalic and atraumatic.     Mouth/Throat:     Mouth: Mucous membranes are moist.  Eyes:     Conjunctiva/sclera: Conjunctivae normal.  Cardiovascular:     Rate and Rhythm: Normal rate and regular rhythm.     Pulses: Normal pulses.     Heart sounds: Normal heart sounds.   Pulmonary:     Effort: Pulmonary effort is normal.     Breath sounds: Normal breath sounds.  Abdominal:     General: Bowel sounds are normal.     Palpations: Abdomen is soft.  Musculoskeletal:        General: No swelling. Normal range of motion.     Cervical back: Normal range of motion.  Skin:    General: Skin is warm and dry.  Neurological:     General: No focal deficit present.     Mental Status: He is alert and oriented to person, place, and time.  Psychiatric:  Mood and Affect: Mood normal.        Behavior: Behavior normal.        Thought Content: Thought content normal.        Judgment: Judgment normal.     Labs reviewed: Basic Metabolic Panel: Recent Labs    06/28/21 0000 08/19/21 0942 12/17/21 1003 02/02/22 0937 06/06/22 0934  NA 146   < > 140 139 140  K 4.4   < > 4.3 4.5 4.7  CL 101   < > 102 103 104  CO2 28*   < > 28 33* 32  GLUCOSE  --    < > 93 84 76  BUN 15   < > '17 19 16  '$ CREATININE 0.8   < > 0.88 0.91 0.90  CALCIUM 8.8   < > 9.8 9.4 9.8  MG 2.3  --   --   --   --   PHOS 3.8  --   --   --   --    < > = values in this interval not displayed.   Liver Function Tests: Recent Labs    08/19/21 0942 12/17/21 1003 02/02/22 0937 06/06/22 0934  AST '15 19 21 21  '$ ALT '14 20 25 24  '$ ALKPHOS 57  --  54 54  BILITOT 1.0 0.9 0.7 1.0  PROT 6.6 6.4 6.6 6.7  ALBUMIN 4.0  --  4.0 4.5   No results for input(s): "LIPASE", "AMYLASE" in the last 8760 hours. No results for input(s): "AMMONIA" in the last 8760 hours. CBC: Recent Labs    08/19/21 0942 02/02/22 0937 06/06/22 0934  WBC 6.0 5.4 6.5  NEUTROABS 4.0 3.3 4.3  HGB 14.9 15.0 16.0  HCT 44.5 45.8 46.6  MCV 101.1* 101.3* 101.3*  PLT 189 194 203   Lipid Panel: Recent Labs    12/17/21 1003  CHOL 148  HDL 63  LDLCALC 67  TRIG 96  CHOLHDL 2.3   Lab Results  Component Value Date   HGBA1C 5.7 02/27/2020    Procedures since last visit: OCT, Retina - OU - Both Eyes  Result Date:  06/10/2022 Right Eye Quality was good. Central Foveal Thickness: 324. Progression has been stable. Findings include normal foveal contour, no IRF, no SRF, myopic contour, retinal drusen , outer retinal atrophy (Stable focal areas of outer retinal atrophy / ellipsoid dropout; scattered drusen). Left Eye Quality was good. Central Foveal Thickness: 325. Progression has been stable. Findings include normal foveal contour, no IRF, no SRF, myopic contour, retinal drusen , outer retinal atrophy (Scattered drusen; focal ORA IT to fovea). 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 CME - cystoid macular edema. PED - pigment epithelial detachment. IRF - intraretinal fluid. SRF - subretinal fluid. EZ - ellipsoid zone. ERM - epiretinal membrane. ORA - outer retinal atrophy. ORT - outer retinal tubulation. SRHM - subretinal hyper-reflective material    Assessment/Plan  1. COVID-19 in immunocompromised patient [facility]) -  will start on Paxlovid -   Hold Simvastatin while on Paxlovid - nirmatrelvir/ritonavir EUA (PAXLOVID) 20 x 150 MG & 10 x '100MG'$  TABS; Take 3 tablets by mouth 2 (two) times daily for 5 days. (Take nirmatrelvir 150 mg two tablets twice daily for 5 days and ritonavir 100 mg one tablet twice daily for 5 days) Patient GFR is >60  Dispense: 30 tablet; Refill: 0    Labs/tests  ordered:  None  Next appt:  08/22/2022

## 2022-06-22 NOTE — Patient Instructions (Addendum)
Do not take Simvastatin while taking Paxlovid. Infection Prevention in the Home If you have an infection, may have been exposed to an infection, or are taking care of someone who has an infection, it is important to know how to keep the infection from spreading. Follow your health care provider's instructions and use these guidelines to help stop the spread of infection. How infections are spread In order for an infection to spread, the following must be present: A germ. This may be a virus, bacteria, fungus, or parasite. A place for the germ to live. This may be: On or in a person, animal, plant, or food. In soil or water. On surfaces, such as a door handle. A person or animal who can develop a disease if the germ enters the body (host). The host does not have resistance to the germ. A way for the germ to enter the host. This may occur by: Direct contact with an infected person or animal. This can happen through shaking hands or hugging. Some germs can also travel through the air and spread to others. This can happen when an infected person coughs or sneezes on or near other people. Indirect contact. This occurs when the germ enters the host through contact with an infected object. Examples include: Eating or drinking food or water that is contaminated with the germ. Touching a contaminated surface with your hands, and then touching your face, eyes, nose, or mouth. Supplies needed: Soap. Alcohol-based hand sanitizer. Standard cleaning products. Disinfectants, such as bleach. Reusable cleaning cloths, sponges, or paper towels. Disposable or reusable utility gloves. How to prevent infection from spreading There are several things that you can do to help prevent infection from spreading. Take these general actions Everyone should take the following actions to prevent the spread of infection: Wash your hands often with soap and water for at least 20 seconds. If soap and water are not  available, use alcohol-based hand sanitizer. Avoid touching your face, mouth, nose, or eyes. Cough or sneeze into a tissue, sleeve, or elbow instead of into your hand or into the air. If you cough or sneeze into a tissue, throw it away immediately and wash your hands.  Keep your bathroom clean Provide soap. Change towels and washcloths frequently. Change toothbrushes often and store them separately in a clean, dry place. Clean and disinfect all surfaces, including the toilet, floor, tub, shower, and sink. Do not share personal items, such as razors, toothbrushes, deodorant, combs, brushes, towels, and washcloths. Maintain hygiene in the [facility] your hands before and after preparing food and before you eat. Clean the inside of your refrigerator each week. Keep your refrigerator set at 83F (4C) or less, and set your freezer at 60F (-18C) or less. Keep work surfaces clean. Disinfect them regularly. Wash your dishes in hot, soapy water. Air-dry your dishes or use a dishwasher. Do not share dishes or eating utensils. Handle food safely Store food carefully. Refrigerate leftovers promptly in covered containers. Throw out stale or spoiled food. Thaw foods in the refrigerator or microwave, not at room temperature. Serve foods at the proper temperature. Do not eat raw meat. Make sure it is cooked to the appropriate temperature. Cook eggs until they are firm. Wash fruits and vegetables under running water. Use separate cutting boards, plates, and utensils for raw foods and cooked foods. Use a clean spoon each time you sample food while cooking. Do laundry the right way Wear gloves if laundry is visibly soiled. Do not shake  soiled laundry. Doing that may send germs into the air. Wash laundry in hot water. If you cannot wash the laundry right away, place it in a plastic bag and wash it as soon as possible. Be careful around animals and pets Wash your hands before and after touching  animals. If you have a pet, ensure that your pet stays clean. Do not let people with weak immune systems touch bird droppings, fish tank water, or a litter box. If you have a pet cage or litter box, be sure to clean it every day. If you are sick, stay away from animals and have someone else care for them if possible. How to clean and disinfect objects and surfaces Precautions Some disinfectants work for certain germs and not others. Read the manufacturer's instructions or read online resources to determine if the product you are using will work for the germ you are trying to remove. If you choose to use bleach, use it safely. Never mix it with other cleaning products, especially those that contain ammonia. This mixture can create a dangerous gas that may be deadly. Keep proper movement of fresh air in your home (ventilation). Pour used mop water down the utility sink or toilet. Do not pour this water down the kitchen sink. Objects and surfaces  If surfaces are visibly soiled, clean them first with soap and water before disinfecting. Disinfect surfaces that are frequently touched every day. This may include: Counters. Tables. Doorknobs. Sinks and faucets. Electronics, such as: Architectural technologist. Remote controls. Keyboards. Computers and tablets. Cleaning supplies Some cleaning supplies can breed germs. Take good care of them to prevent germs from spreading. To do this: Soak toilet brushes, mops, and sponges in bleach and water for 5 minutes after each use, or according to manufacturer's instructions. Wash reusable cleaning cloths and sanitize sponges after each use. Throw away disposable gloves after one use. Replace reusable utility gloves if they are cracked or torn or if they start to peel. Additional actions if you are sick If you live with other people:  Avoid close contact with those around you. Stay at least 3 ft (1 m) away from others, if possible. Use a separate bathroom, if  possible. If possible, sleep in a separate bedroom or in a separate bed to prevent infecting other household members. Change bedroom linens each week or whenever they are soiled. Have everyone in the household wash hands often with soap and water for at least 20 seconds. If soap and water are not available, use alcohol-based hand sanitizer. In general: Stay home except to get medical care. Call ahead before visiting your health care provider. Ask others to get groceries and household supplies and to refill prescriptions for you. Avoid public areas. Try not to take public transportation. If you can, wear a mask if you need to go out of the house, or if you are in close contact with someone who is not sick. Avoid visitors until you have completely recovered, or until you have no signs and symptoms of infection. Avoid preparing food or providing care for others. If you must prepare food or provide care for others, wear a mask and wash your hands before and after doing these things. Where to find more information Centers for Disease Control and Prevention: Store[employer].com.cy Summary It is important to know how to keep infection from spreading. Make sure everyone in your household washes their hands often with soap and water. Disinfect surfaces that are frequently touched every day. If you are sick, stay  home except to get medical care. This information is not intended to replace advice given to you by your health care provider. Make sure you discuss any questions you have with your health care provider. Document Revised: 11/22/2021 Document Reviewed: 11/22/2021 Elsevier Patient Education  [place].

## 2022-06-25 DIAGNOSIS — J9601 Acute respiratory failure with hypoxia: Secondary | ICD-10-CM | POA: Diagnosis not present

## 2022-07-07 DIAGNOSIS — G4733 Obstructive sleep apnea (adult) (pediatric): Secondary | ICD-10-CM | POA: Diagnosis not present

## 2022-07-07 DIAGNOSIS — U071 COVID-19: Secondary | ICD-10-CM | POA: Diagnosis not present

## 2022-07-13 ENCOUNTER — Other Ambulatory Visit: Payer: Self-pay | Admitting: Adult Health

## 2022-07-13 DIAGNOSIS — U071 COVID-19: Secondary | ICD-10-CM

## 2022-07-13 DIAGNOSIS — D849 Immunodeficiency, unspecified: Secondary | ICD-10-CM

## 2022-07-14 ENCOUNTER — Ambulatory Visit: Payer: Self-pay

## 2022-07-14 NOTE — Patient Outreach (Signed)
  Care Coordination   Initial Visit Note   07/14/2022 Name: Antonio [name] MRN: [id number] DOB: 17-Jul-1941  Antonio [name] is a 81 y.o. year old male who sees [name], MD for primary care. I spoke with  Sela Hua by phone today.  What matters to the patients health and wellness today?  Doing well, no concerns at this time    Goals Addressed             This Visit's Progress    COMPLETED: Care Coordination Activities       Care Coordination Interventions: SDoH screening completed - no acute resource challenges identified at this time Determined the patient does not have concerns with medication costs and/or adherence Performed chart review to note recent COVID 19 infection - patient reports he is feeling better and doing well Instructed the patient to contact his primary care provider as needed        SDOH assessments and interventions completed:  Yes  SDOH Interventions Today    Flowsheet Row Most Recent Value  SDOH Interventions   Food Insecurity Interventions Intervention Not Indicated  Housing Interventions Intervention Not Indicated  Transportation Interventions Intervention Not Indicated  Utilities Interventions Intervention Not Indicated  Financial Strain Interventions Intervention Not Indicated        Care Coordination Interventions Activated:  Yes  Care Coordination Interventions:  Yes, provided   Follow up plan: No further intervention required.   Encounter Outcome:  Pt. Visit Completed   Daneen Schick, BSW, CDP Social Worker, Certified Dementia Practitioner [facility]  Care Coordination [phone]

## 2022-07-14 NOTE — Patient Instructions (Signed)
Visit Information  Thank you for taking time to visit with me today. Please don't hesitate to contact me if I can be of assistance to you.   Following are the goals we discussed today:   Goals Addressed             This Visit's Progress    COMPLETED: Care Coordination Activities       Care Coordination Interventions: SDoH screening completed - no acute resource challenges identified at this time Determined the patient does not have concerns with medication costs and/or adherence Performed chart review to note recent COVID 19 infection - patient reports he is feeling better and doing well Instructed the patient to contact his primary care provider as needed         If you are experiencing a Mental Health or [place] or need someone to talk to, please call 1-800-273-TALK (toll free, 24 hour hotline)  Patient verbalizes understanding of instructions and care plan provided today and agrees to view in [place]. Active MyChart status and patient understanding of how to access instructions and care plan via MyChart confirmed with patient.     No further follow up required: Please contact your primary care provider as needed.  Daneen Schick, BSW, CDP Social Worker, Certified Dementia Practitioner [facility]  Care Coordination [phone]

## 2022-07-25 ENCOUNTER — Telehealth: Payer: Self-pay | Admitting: Pulmonary Disease

## 2022-07-25 NOTE — Telephone Encounter (Signed)
Called [employer] and left voicemail for Danielle to give office a call back in regards to this patient stating that insurance is no longer covering oxygen. Will wait for call back.

## 2022-07-28 NOTE — Telephone Encounter (Signed)
Patient has appt with Dr Vaughan Browner on 08/23/2022 in regards to oxygen and insurance. Nothing further needed

## 2022-07-28 NOTE — Telephone Encounter (Signed)
Antonio [name] from [place] states returning phone call for insurance question. Antonio [name] phone number is [phone].

## 2022-08-03 ENCOUNTER — Other Ambulatory Visit: Payer: Self-pay | Admitting: Internal Medicine

## 2022-08-06 DIAGNOSIS — G4733 Obstructive sleep apnea (adult) (pediatric): Secondary | ICD-10-CM | POA: Diagnosis not present

## 2022-08-06 DIAGNOSIS — U071 COVID-19: Secondary | ICD-10-CM | POA: Diagnosis not present

## 2022-08-10 DIAGNOSIS — E291 Testicular hypofunction: Secondary | ICD-10-CM | POA: Diagnosis not present

## 2022-08-17 DIAGNOSIS — E291 Testicular hypofunction: Secondary | ICD-10-CM | POA: Diagnosis not present

## 2022-08-17 DIAGNOSIS — N401 Enlarged prostate with lower urinary tract symptoms: Secondary | ICD-10-CM | POA: Diagnosis not present

## 2022-08-17 DIAGNOSIS — R3915 Urgency of urination: Secondary | ICD-10-CM | POA: Diagnosis not present

## 2022-08-17 DIAGNOSIS — R351 Nocturia: Secondary | ICD-10-CM | POA: Diagnosis not present

## 2022-08-22 ENCOUNTER — Non-Acute Institutional Stay: Payer: PPO | Admitting: Adult Health

## 2022-08-22 ENCOUNTER — Encounter: Payer: Self-pay | Admitting: Adult Health

## 2022-08-22 VITALS — BP 148/80 | HR 83 | Temp 97.6°F | Resp 18 | Ht 72.0 in | Wt 187.4 lb

## 2022-08-22 DIAGNOSIS — G4733 Obstructive sleep apnea (adult) (pediatric): Secondary | ICD-10-CM | POA: Diagnosis not present

## 2022-08-22 DIAGNOSIS — R0609 Other forms of dyspnea: Secondary | ICD-10-CM | POA: Diagnosis not present

## 2022-08-22 DIAGNOSIS — M818 Other osteoporosis without current pathological fracture: Secondary | ICD-10-CM

## 2022-08-22 DIAGNOSIS — Z8572 Personal history of non-Hodgkin lymphomas: Secondary | ICD-10-CM

## 2022-08-22 DIAGNOSIS — I700 Atherosclerosis of aorta: Secondary | ICD-10-CM | POA: Diagnosis not present

## 2022-08-22 DIAGNOSIS — D472 Monoclonal gammopathy: Secondary | ICD-10-CM | POA: Diagnosis not present

## 2022-08-22 DIAGNOSIS — T387X5A Adverse effect of androgens and anabolic congeners, initial encounter: Secondary | ICD-10-CM | POA: Diagnosis not present

## 2022-08-22 DIAGNOSIS — U099 Post covid-19 condition, unspecified: Secondary | ICD-10-CM

## 2022-08-22 DIAGNOSIS — E7800 Pure hypercholesterolemia, unspecified: Secondary | ICD-10-CM | POA: Diagnosis not present

## 2022-08-22 DIAGNOSIS — H9113 Presbycusis, bilateral: Secondary | ICD-10-CM

## 2022-08-22 DIAGNOSIS — I10 Essential (primary) hypertension: Secondary | ICD-10-CM

## 2022-08-22 DIAGNOSIS — H353132 Nonexudative age-related macular degeneration, bilateral, intermediate dry stage: Secondary | ICD-10-CM

## 2022-08-22 NOTE — Progress Notes (Unsigned)
Location:  [facility]  POS: Clinic  Provider: Royal Hawthorn, ANP  Code Status: *** Goals of Care:     08/22/2022    1:22 PM  Advanced Directives  Does Patient Have a Medical Advance Directive? Yes  Type of Paramedic of [place];Living will;Out of facility DNR (pink MOST or yellow form)  Copy of [place] in Chart? Yes - validated most recent copy scanned in chart (See row information)     Chief Complaint  Patient presents with   Medical Management of Chronic Issues    6 month follow up   Quality Metric Gaps    Discuss the need for AWV    HPI: Patient is a 81 y.o. male seen today for medical management of chronic diseases.     Had Covid Sept of 2023 treated with paxlovid.   Hx of B cell lymphoma of testes x2  followed by Dr. Irene Limbo in remission s/p right orchiectomy, chemo , and radiation.   Has a significant Covid infection with prolonged hospitalization in Jan of 2022  He is followed by Dr Vaughan Browner with pulmonary with post covid ILD. 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  Feels some sob with exertion and elevation in mountains. Has oxygen with him but has not needed it. 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 Has been on for 4 weeks. Sleeping better with less fatigue.   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   ED (erectile dysfunction)    Elevated diaphragm    right   Erectile dysfunction    History of basal cell carcinoma (BCC) excision    multiple excision in office   History of kidney stones    History of seizure    03-03-2020 per pt x1 while taking interferan for melanoma in 2001 ,  none since, told a side effect of interferan   History of squamous cell carcinoma excision    multiple skin excision in office   Hyperlipidemia    Hypertension    followed by pcp  (03-03-2020 per pt had a stress test approx. 2010, told normal)   LBBB (left bundle branch block)    Lymphoma of testis [facility]) 2000   s/p right orchiectomy    Mass of left testis    MGUS (monoclonal gammopathy of unknown significance)    oncologist--- dr Irene Limbo   Mixed hyperlipidemia 10/26/2020   Personal history of malignant melanoma of skin dermatologist--- dr Wilhemina Bonito   dx 2001 left calf area  s/p WLE ,  completed one year interferan (03-03-2020 pt states did not  involve lymph nodes, no recurrence)   Urgency of urination    Wears hearing aid in both ears     Past Surgical History:  Procedure Laterality Date   CATARACT EXTRACTION W/ INTRAOCULAR LENS IMPLANT Bilateral 2018   IR IMAGING GUIDED PORT INSERTION  03/31/2020   MELANOMA EXCISION  10/1999   ORCHIECTOMY Right 2000   ORCHIECTOMY Left 03/05/2020   Procedure: ORCHIECTOMY;  Surgeon: [name], MD;  Location: [facility];  Service: Urology;  Laterality: Left;   TONSILLECTOMY  child   TOTAL KNEE ARTHROPLASTY Right 06/2009    Allergies  Allergen Reactions   Latex Itching    Latex tape causes itching and redness.    Outpatient Encounter Medications as of 08/22/2022  Medication Sig   aspirin 81 MG EC  tablet Take 1 tablet (81 mg total) by mouth daily. Swallow whole.   carboxymethylcellulose (REFRESH PLUS) 0.5 % SOLN Place 1 drop into both eyes daily as needed.   Cholecalciferol 50 MCG (2000 UT) CAPS Take 1 capsule by mouth 2 (two) times daily.   fluticasone (FLONASE) 50 MCG/ACT nasal spray Place 2 sprays into both nostrils daily.   losartan (COZAAR) 100 MG tablet TAKE 1 TABLET BY MOUTH EVERY DAY   meloxicam (MOBIC) 7.5 MG tablet TAKE 1 TABLET BY MOUTH EVERY DAY   Multiple Vitamins-Minerals (PRESERVISION AREDS 2 PO) Take 1 capsule by mouth 2 (two) times daily.    propranolol (INDERAL) 10 MG tablet TAKE 1 TABLET BY MOUTH THREE TIMES A DAY   simvastatin (ZOCOR) 40 MG tablet TAKE 1 TABLET BY MOUTH EVERY DAY   Testosterone 20.25 MG/ACT (1.62%) GEL Apply 40.5 mg topically daily.   valACYclovir (VALTREX) 1000 MG tablet Take 1000 (1 grams) PO 2 times daily for 3 days as needed for flares (herpes)   vitamin B-12 (CYANOCOBALAMIN) 1000 MCG tablet Take 1,000 mcg by mouth daily.   [DISCONTINUED] dextromethorphan-guaiFENesin (MUCINEX DM) 30-600 MG 12hr tablet Take 1 tablet by mouth 2 (two) times daily as needed.   [DISCONTINUED] Homeopathic Products (ZICAM ALLERGY RELIEF NA) Place into the nose as needed. 1 tablet every 3 hours   No facility-administered encounter medications on file as of 08/22/2022.    Review of Systems:  Review of Systems  Health Maintenance  Topic Date Due   Medicare Annual Wellness (AWV)  08/13/2021   COVID-19 Vaccine (6 - Moderna risk series) 09/12/2022   TETANUS/TDAP  03/04/2024   Pneumonia Vaccine 87+ Years old  Completed   INFLUENZA VACCINE  Completed   Zoster Vaccines- Shingrix  Completed   HPV VACCINES  Aged Out    Physical Exam: Vitals:   08/22/22 1318  BP: (!) 148/80  Pulse: 83  Resp: 18  Temp: 97.6 F (36.4 C)  TempSrc: Temporal  SpO2: 98%  Weight: 187 lb 6.4 oz (85 kg)  Height: 6' (1.829 m)   Body mass index is 25.42 kg/m. Physical Exam  Labs  reviewed: Basic Metabolic Panel: Recent Labs    12/17/21 1003 02/02/22 0937 06/06/22 0934  NA 140 139 140  K 4.3 4.5 4.7  CL 102 103 104  CO2 28 33* 32  GLUCOSE 93 84 76  BUN '17 19 16  '$ CREATININE 0.88 0.91 0.90  CALCIUM 9.8 9.4 9.8   Liver Function Tests: Recent Labs    12/17/21 1003 02/02/22 0937 06/06/22 0934  AST '19 21 21  '$ ALT '20 25 24  '$ ALKPHOS  --  54 54  BILITOT 0.9 0.7 1.0  PROT 6.4 6.6 6.7  ALBUMIN  --  4.0 4.5   No results for input(s): "LIPASE", "AMYLASE" in the last 8760 hours. No results for input(s): "AMMONIA" in the last 8760 hours. CBC: Recent Labs    02/02/22 0937 06/06/22 0934  WBC 5.4 6.5  NEUTROABS 3.3 4.3  HGB 15.0 16.0  HCT 45.8 46.6  MCV 101.3* 101.3*  PLT 194 203   Lipid Panel: Recent Labs    12/17/21 1003  CHOL 148  HDL 63  LDLCALC 67  TRIG 96  CHOLHDL 2.3   Lab Results  Component Value Date   HGBA1C 5.7 02/27/2020    Procedures since last visit: No results found.  Assessment/Plan There are no diagnoses linked to this encounter.   Labs/tests ordered:  * No order type specified * labs done  Next appt:   F/U 6 months    Total time **:  time greater than 50% of total time spent doing pt counseling and coordination of care

## 2022-08-23 ENCOUNTER — Encounter: Payer: Self-pay | Admitting: Pulmonary Disease

## 2022-08-23 ENCOUNTER — Ambulatory Visit: Payer: PPO | Admitting: Pulmonary Disease

## 2022-08-23 VITALS — BP 142/78 | HR 95 | Temp 97.6°F | Ht 72.0 in | Wt 187.0 lb

## 2022-08-23 DIAGNOSIS — U099 Post covid-19 condition, unspecified: Secondary | ICD-10-CM | POA: Diagnosis not present

## 2022-08-23 DIAGNOSIS — G4733 Obstructive sleep apnea (adult) (pediatric): Secondary | ICD-10-CM | POA: Diagnosis not present

## 2022-08-23 DIAGNOSIS — R0609 Other forms of dyspnea: Secondary | ICD-10-CM | POA: Diagnosis not present

## 2022-08-23 DIAGNOSIS — R0602 Shortness of breath: Secondary | ICD-10-CM

## 2022-08-23 NOTE — Progress Notes (Signed)
Antonio [name]    [id number]    [birth year]  Primary Care Physician:Gupta, Rene Kocher, MD  Referring Physician: [name], MD [address] [place],  [place] [address]   Chief complaint:  Follow up post Covid syndrome, hospitalization for pneumonia  HPI: 81 y.o.  with history of hypertension, recurrent testicular large B-cell lymphoma Hospitalized for COVID-19 in January 2022.  He was immunized and boosted against COVID Treated with remdesivir, prednisone.  Hospital course complicated by SVT, diastolic heart failure.  He went to rehab till mid February.  Weaned off oxygen before being sent home. He was on a Pred taper till mid February.  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  Reports slightly increased dyspnea with congestion over the past week with O2 sats dropping to the mid 80s.  He also has post Covid cognitive issues and forgetfulness. Denies any fevers, chills.  He has history of large B-cell lymphoma in 2009 recurrence in 2021.  He received chemotherapy until late 2021 and hence considered immunocompromised  Hospitalized in August 2022 for multifocal pneumonia.  He required BiPAP initially.  Treated with IV antibiotics, bronchodilators.  CT chest on admission was negative for PE with bilateral infiltrates suspicious for multifocal pneumonia.  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  No significant recent travel Relevant family history: No family history of lung disease  Interim history: He is doing well with his breathing.  Recently had a sleep study and has been started on CPAP which he is  tolerating well. He did not desat on exertion today. Taken himself off supplemental oxygen at home  Outpatient Encounter Medications as of 08/23/2022  Medication Sig   aspirin 81 MG EC tablet Take 1 tablet (81 mg total) by mouth daily. Swallow whole.   carboxymethylcellulose (REFRESH PLUS) 0.5 % SOLN Place 1 drop into both eyes daily as needed.   Cholecalciferol 50 MCG (2000 UT) CAPS Take 1 capsule by mouth 2 (two) times daily.   fluticasone (FLONASE) 50 MCG/ACT nasal spray Place 2 sprays into both nostrils daily.   losartan (COZAAR) 100 MG tablet TAKE 1 TABLET BY MOUTH EVERY DAY   meloxicam (MOBIC) 7.5 MG tablet TAKE 1 TABLET BY MOUTH EVERY DAY   Multiple Vitamins-Minerals (PRESERVISION AREDS 2 PO) Take 1 capsule by mouth 2 (two) times daily.    propranolol (INDERAL) 10 MG tablet TAKE 1 TABLET BY MOUTH THREE TIMES A DAY   simvastatin (ZOCOR) 40 MG tablet TAKE 1 TABLET BY MOUTH EVERY DAY   Testosterone 20.25 MG/ACT (1.62%) GEL Apply 40.5 mg topically daily.   valACYclovir (VALTREX) 1000 MG tablet Take 1000 (1 grams) PO 2 times daily for 3 days as needed for flares (herpes)   vitamin B-12 (CYANOCOBALAMIN) 1000 MCG tablet Take 1,000 mcg by mouth daily.   No facility-administered encounter medications on file as of 08/23/2022.    Allergies as of 08/23/2022 - Review Complete 08/23/2022  Allergen Reaction Noted   Latex Itching 03/12/2020   Physical Exam: Blood pressure (!) 142/78, pulse 95, temperature 97.6 F (36.4 C), temperature source Oral, height 6' (1.829 m), weight 187 lb (84.8 kg), SpO2 97 %.  Gen:      No acute distress HEENT:  EOMI, sclera anicteric Neck:     No masses; no thyromegaly Lungs:    Clear to auscultation bilaterally; normal respiratory effort CV:         Regular rate and rhythm; no murmurs Abd:      + bowel sounds; soft, non-tender; no palpable masses, no distension Ext:    No edema; adequate peripheral perfusion Skin:      Warm and dry; no rash Neuro: alert and  oriented x 3 Psych: normal mood and affect   Data Reviewed: Imaging: CT chest 10/20/2020-multifocal infiltrates consistent with pneumonia CTA 10/26/2020-no PE, multifocal infiltrates.   High-resolution CT 01/13/2021- bilateral groundglass opacities, reticulation with mild traction bronchiectasis consistent with post-COVID ILD CT angiogram 06/15/2021-no PE, multifocal infiltrates CT-history from 12/14/2021-stable mild scarring consistent with post-COVID ILD I have reviewed the images personally.  PFTs:   Labs:  Sleep: Home sleep study 05/17/2022 Mild sleep apnea with AHI 7.3, O2 sat of 78%  Assessment:  Post Covid 19 Overall he is improving and appears back to baseline  Overall he is doing well, weaned himself off continuous oxygen He still uses the portable concentrator while traveling on the flight and would like that renewed.  I told him that insurance may not cover it and he may need to pay the cost out-of-pocket. Follow-up CT reviewed with stable mild scarring consistent with post-COVID  Mild sleep apnea Started on AutoSet CPAP.  He is tolerating it well Download reviewed with good compliance and response.  Plan/Recommendations: Continue CPAP Portable concentrator for travel.  Marshell Garfinkel MD [facility] 08/23/2022, 9:42 AM  CC: [name], MD

## 2022-08-23 NOTE — Addendum Note (Signed)
Addended by: Elton Sin on: 08/23/2022 10:48 AM   Modules accepted: Orders

## 2022-08-23 NOTE — Patient Instructions (Signed)
I am glad you are doing well with your breathing and tolerating the CPAP well We will place an order for portable concentrator for travel. Follow-up in 6 months.

## 2022-08-24 ENCOUNTER — Telehealth: Payer: Self-pay | Admitting: Pulmonary Disease

## 2022-08-24 NOTE — Telephone Encounter (Signed)
Pt called wanting to know if it is checked to see if insurance covers POCs, especially inogens and if so, which [employer]ogen POC they would be talking about. Stated to pt that once the order is placed, DME will do the insurance verification and when able to receive the inogen, DME will call him to set up a delivery of the [employer] POC. Pt verbalized understanding. Nothing further needed.

## 2022-08-25 ENCOUNTER — Telehealth: Payer: Self-pay | Admitting: *Deleted

## 2022-08-25 ENCOUNTER — Ambulatory Visit (HOSPITAL_BASED_OUTPATIENT_CLINIC_OR_DEPARTMENT_OTHER)
Admission: RE | Admit: 2022-08-25 | Discharge: 2022-08-25 | Disposition: A | Discharge status: Home / Self Care | Payer: PPO | Source: Ambulatory Visit | Attending: Adult Health | Admitting: Adult Health

## 2022-08-25 DIAGNOSIS — M818 Other osteoporosis without current pathological fracture: Secondary | ICD-10-CM | POA: Diagnosis not present

## 2022-08-25 DIAGNOSIS — T387X5A Adverse effect of androgens and anabolic congeners, initial encounter: Secondary | ICD-10-CM | POA: Insufficient documentation

## 2022-08-25 DIAGNOSIS — M85852 Other specified disorders of bone density and structure, left thigh: Secondary | ICD-10-CM | POA: Diagnosis not present

## 2022-08-25 NOTE — Telephone Encounter (Signed)
Patient called regarding his Bone Density. Stated that he wonders why he is not on a Calcium Supplement. Stated that the only thing he is taking for this is Vitamin D. Wants to know if he should be taking a Calcium Supplement and if so, what should he take?  Please Advise.     DG Bone Density: Result Notes   Carroll Kinds, [facility] 08/25/2022 11:15 AM EST     Discussed with patient and he verbalized his understanding.   Royal Hawthorn, NP 08/25/2022  9:22 AM EST     Please let patient know bone density scan is showing osteopenia NOT osteoporosis. Recommend continuing current regimen and encouraging weight bearing exercise.

## 2022-08-25 NOTE — Telephone Encounter (Signed)
1200 mg per day of calcium would be a good recommendation if he wants to try an over the counter supplement if he does not feel he gets enough in his diet

## 2022-08-26 NOTE — Telephone Encounter (Signed)
LMOM to return call.

## 2022-08-26 NOTE — Telephone Encounter (Signed)
Forwarded message to Clinical Intake.

## 2022-08-30 NOTE — Telephone Encounter (Signed)
Patient notified and agreed.  

## 2022-08-30 NOTE — Telephone Encounter (Signed)
LMOM to return call.

## 2022-09-06 DIAGNOSIS — U071 COVID-19: Secondary | ICD-10-CM | POA: Diagnosis not present

## 2022-09-06 DIAGNOSIS — G4733 Obstructive sleep apnea (adult) (pediatric): Secondary | ICD-10-CM | POA: Diagnosis not present

## 2022-09-21 ENCOUNTER — Telehealth: Payer: Self-pay | Admitting: Pulmonary Disease

## 2022-09-21 NOTE — Telephone Encounter (Signed)
ATC LVMTCB X1  

## 2022-09-22 NOTE — Telephone Encounter (Signed)
Order for pt to receive a travel POC from [facility] was placed at pt's last OV. Routing to PCCs to check on this.

## 2022-09-24 DIAGNOSIS — J9601 Acute respiratory failure with hypoxia: Secondary | ICD-10-CM | POA: Diagnosis not present

## 2022-09-26 NOTE — Telephone Encounter (Signed)
Faxed separately to [employer]

## 2022-09-27 ENCOUNTER — Telehealth: Payer: Self-pay | Admitting: Student

## 2022-09-27 NOTE — Telephone Encounter (Signed)
[facility] called to confirm fax for Oxygen that was sent today for patient.  Please call to confirm receipt.  CB# [phone]

## 2022-09-28 ENCOUNTER — Telehealth: Payer: Self-pay | Admitting: Pulmonary Disease

## 2022-09-28 NOTE — Telephone Encounter (Signed)
Patient is established in seen in office by Dr.Mannam.  [employer] fax received this morning for Dr. Vaughan Browner to sign and be faxed back to [employer].  Will fax form once completed.

## 2022-09-28 NOTE — Telephone Encounter (Signed)
Called regarding a fax for a POC that was sent yesterday.  Would like to know if it was received and to get an update on the status of the order.  Please advise and call to discuss at [phone]

## 2022-09-29 ENCOUNTER — Telehealth: Payer: Self-pay | Admitting: Pulmonary Disease

## 2022-09-29 NOTE — Telephone Encounter (Signed)
Message closed.  See encounter 09/28/22.

## 2022-09-29 NOTE — Telephone Encounter (Signed)
Lattie Haw,  Do you still have this order that was faxed to [employer] yesterday?  Scott from [employer] that it needed a signature, data and flow settings.  Please advise

## 2022-09-29 NOTE — Telephone Encounter (Signed)
Message closed. See 09/27/22 encounter.

## 2022-09-29 NOTE — Telephone Encounter (Signed)
[facility] states fax'd order for port 02 needs signature, date and flow setting. 3rd call here. TY Scott @ [phone]

## 2022-09-29 NOTE — Telephone Encounter (Signed)
Scott from [employer] again. 3rd call, he states. PT script for [facility] 02 need the following:  Flow Setting Date Dr. Serena Croissant  His # is [phone] Nicki Reaper  Did we get req fax'd over? If we did please complet as req and fax back. Fax # on request. TY

## 2022-09-29 NOTE — Telephone Encounter (Signed)
[employer] form completed and faxed to [phone] attention Lavena Bullion.  Confirmation received. Nothing further at this time.

## 2022-10-05 DIAGNOSIS — G4733 Obstructive sleep apnea (adult) (pediatric): Secondary | ICD-10-CM | POA: Diagnosis not present

## 2022-10-06 DIAGNOSIS — G4733 Obstructive sleep apnea (adult) (pediatric): Secondary | ICD-10-CM | POA: Diagnosis not present

## 2022-10-06 DIAGNOSIS — U071 COVID-19: Secondary | ICD-10-CM | POA: Diagnosis not present

## 2022-10-24 ENCOUNTER — Other Ambulatory Visit: Payer: Self-pay

## 2022-10-24 ENCOUNTER — Other Ambulatory Visit: Payer: Self-pay | Admitting: Internal Medicine

## 2022-10-24 MED ORDER — VALACYCLOVIR HCL 1 G PO TABS: 1000.0000 mg | ORAL_TABLET | Freq: Two times a day (BID) | ORAL | 3 refills | Status: AC

## 2022-10-25 DIAGNOSIS — J9601 Acute respiratory failure with hypoxia: Secondary | ICD-10-CM | POA: Diagnosis not present

## 2022-11-01 ENCOUNTER — Other Ambulatory Visit: Payer: Self-pay | Admitting: Internal Medicine

## 2022-11-06 DIAGNOSIS — G4733 Obstructive sleep apnea (adult) (pediatric): Secondary | ICD-10-CM | POA: Diagnosis not present

## 2022-11-06 DIAGNOSIS — U071 COVID-19: Secondary | ICD-10-CM | POA: Diagnosis not present

## 2022-11-07 ENCOUNTER — Other Ambulatory Visit: Payer: Self-pay | Admitting: Internal Medicine

## 2022-11-23 ENCOUNTER — Encounter: Payer: Self-pay | Admitting: Pulmonary Disease

## 2022-11-23 DIAGNOSIS — R0609 Other forms of dyspnea: Secondary | ICD-10-CM

## 2022-11-23 DIAGNOSIS — J9611 Chronic respiratory failure with hypoxia: Secondary | ICD-10-CM

## 2022-11-24 ENCOUNTER — Other Ambulatory Visit: Payer: Self-pay | Admitting: Internal Medicine

## 2022-11-25 DIAGNOSIS — J9601 Acute respiratory failure with hypoxia: Secondary | ICD-10-CM | POA: Diagnosis not present

## 2022-11-29 NOTE — Telephone Encounter (Signed)
Okay to discontinue oxygen.  Please place order.

## 2022-12-02 ENCOUNTER — Telehealth: Payer: Self-pay | Admitting: Pulmonary Disease

## 2022-12-02 NOTE — Telephone Encounter (Signed)
Sleep study printed for pt. Called and spoke with pt letting him know this had been done and he verbalized understanding. Pt will come by office Monday 2/19 to pick results up. Nothing further needed.

## 2022-12-07 ENCOUNTER — Other Ambulatory Visit: Payer: Self-pay

## 2022-12-07 DIAGNOSIS — C8339 Diffuse large B-cell lymphoma, extranodal and solid organ sites: Secondary | ICD-10-CM

## 2022-12-07 DIAGNOSIS — U071 COVID-19: Secondary | ICD-10-CM | POA: Diagnosis not present

## 2022-12-07 DIAGNOSIS — G4733 Obstructive sleep apnea (adult) (pediatric): Secondary | ICD-10-CM | POA: Diagnosis not present

## 2022-12-09 ENCOUNTER — Inpatient Hospital Stay: Payer: PPO | Attending: Hematology | Admitting: Hematology

## 2022-12-09 ENCOUNTER — Inpatient Hospital Stay: Payer: PPO

## 2022-12-09 ENCOUNTER — Other Ambulatory Visit: Payer: Self-pay

## 2022-12-09 VITALS — BP 140/82 | HR 73 | Temp 97.7°F | Resp 20 | Wt 189.8 lb

## 2022-12-09 DIAGNOSIS — Z8547 Personal history of malignant neoplasm of testis: Secondary | ICD-10-CM | POA: Diagnosis not present

## 2022-12-09 DIAGNOSIS — C8339 Diffuse large B-cell lymphoma, extranodal and solid organ sites: Secondary | ICD-10-CM | POA: Diagnosis not present

## 2022-12-09 DIAGNOSIS — Z85820 Personal history of malignant melanoma of skin: Secondary | ICD-10-CM | POA: Insufficient documentation

## 2022-12-09 DIAGNOSIS — D472 Monoclonal gammopathy: Secondary | ICD-10-CM | POA: Diagnosis not present

## 2022-12-09 LAB — CMP (CANCER CENTER ONLY)
ALT: 24 U/L (ref 0–44)
AST: 19 U/L (ref 15–41)
Albumin: 4.4 g/dL (ref 3.5–5.0)
Alkaline Phosphatase: 51 U/L (ref 38–126)
Anion gap: 5 (ref 5–15)
BUN: 18 mg/dL (ref 8–23)
CO2: 32 mmol/L (ref 22–32)
Calcium: 8.8 mg/dL — ABNORMAL LOW (ref 8.9–10.3)
Chloride: 102 mmol/L (ref 98–111)
Creatinine: 0.86 mg/dL (ref 0.61–1.24)
GFR, Estimated: >60 mL/min (ref >60)
Glucose, Bld: 132 mg/dL — ABNORMAL HIGH (ref 70–99)
Potassium: 4.5 mmol/L (ref 3.5–5.1)
Sodium: 139 mmol/L (ref 135–145)
Total Bilirubin: 1 mg/dL (ref 0.3–1.2)
Total Protein: 6.8 g/dL (ref 6.5–8.1)

## 2022-12-09 LAB — CBC WITH DIFFERENTIAL (CANCER CENTER ONLY)
Abs Immature Granulocytes: 0.03 10*3/uL (ref 0.00–0.07)
Basophils Absolute: 0.1 10*3/uL (ref 0.0–0.1)
Basophils Relative: 1 %
Eosinophils Absolute: 0.2 10*3/uL (ref 0.0–0.5)
Eosinophils Relative: 2 %
HCT: 44.6 % (ref 39.0–52.0)
Hemoglobin: 15.1 g/dL (ref 13.0–17.0)
Immature Granulocytes: 0 %
Lymphocytes Relative: 21 %
Lymphs Abs: 1.5 10*3/uL (ref 0.7–4.0)
MCH: 34.8 pg — ABNORMAL HIGH (ref 26.0–34.0)
MCHC: 33.9 g/dL (ref 30.0–36.0)
MCV: 102.8 fL — ABNORMAL HIGH (ref 80.0–100.0)
Monocytes Absolute: 0.6 10*3/uL (ref 0.1–1.0)
Monocytes Relative: 8 %
Neutro Abs: 5 10*3/uL (ref 1.7–7.7)
Neutrophils Relative %: 68 %
Platelet Count: 210 10*3/uL (ref 150–400)
RBC: 4.34 MIL/uL (ref 4.22–5.81)
RDW: 13.7 % (ref 11.5–15.5)
WBC Count: 7.4 10*3/uL (ref 4.0–10.5)
nRBC: 0 % (ref 0.0–0.2)

## 2022-12-09 LAB — LACTATE DEHYDROGENASE: LDH: 121 U/L (ref 98–192)

## 2022-12-09 NOTE — Progress Notes (Signed)
HEMATOLOGY/ONCOLOGY CLINIC NOTE  Date of Service: 12/09/22    Patient Care Team: [name], MD as PCP - General (Internal Medicine)  CHIEF COMPLAINTS/PURPOSE OF CONSULTATION:  Follow-up for continued evaluation and management of recurrent testicular large B-cell lymphoma.  HISTORY OF PRESENTING ILLNESS:   Antonio [name] is a wonderful 82 y.o. male who has been referred to [place] by Dr. Hollace Kinnier for evaluation and management of Bone lesion of left lower leg. The pt reports that he is doing well overall.   The pt reports that he cannot feel the ovoid finding from his 04/09/18 MRI as noted below. He denies being able to feel anything in his skin as well. He notes that he received a needle injection in his knee prior to this MRI. The pt notes that his knee has lost much cartilage and is considering a knee replacement. He denies any recent injuries to his knee or falls. He has contacted Dr. Alphonsa Overall in surgery and another surgeon as well and will be making an appointment.   The pt notes that he does not feel any differently recently as compared to 6 months to a year ago. He does note that he normally has constipation in the spring, and has continued to have mild constipation. He denies any other concerns or symptoms.   The pt previously saw my colleague Dr. Lurline Del for primary testicular B-cell lymphoma in 2000. The pt notes that the involvement was limited to only one testicle. He received 6 intrathecal treatments in his spine, and received 4 cycles of chemotherapy (thinks this was CHOP) and radiation as well. The pt was followed by an oncologist for several years at the [facility] in [place] after moving from [place] after his [place] treatment.   The pt notes that he was first diagnosed with melanoma in October 2000. He noticed a strange spot on his left leg that was surgically resected. The pt's personal notes report a 2.9cm thick, level 4,  no LN involvement, high risk stage II, treated with Interferon three times a week for one year. He notes some squamous cell and basal cell involvements as well and sees a dermatologist, Dr. Wilhemina Bonito, regularly.   Of note prior to the patient's visit today, pt has had MRI Left Knee completed on 04/09/18 with results revealing An ovoid, lobulated, 12m T2 hyperintense focus within the medial subcutaneous far 1.958mdistal to the joint demonstrates demonstrates nonspecific imaging characteristics.   Most recent lab results (03/01/18) of CBC w/diff is as follows: all values are WNL except for RBC at 4.54, MCV at 100.4, MCH at 33.8.   On review of systems, pt reports left knee pain, mild constipation, and denies new or concerning symptoms, noticing any new lumps or bumps, pain along the spine, abdominal pains, problems passing urine, testicular pain/swelling, and any other symptoms.   INTERVAL HISTORY:   Antonio RAINGEs an [age]/o male here for continued evaluation and management of his recurrent testicular large B-cell lymphoma. Patient was last seen by me on 06/06/22 and complained of continued chronic SOB from his previous COVID-19 infection, but was otherwise doing well overall.  Today, he reports that he has been doing well overall and his breathing has been okay. He regularly uses a CPAP at night but is concerned that his machine is not working efficiently as it is leaking. He does not use oxygen and only uses a CPAP machine every night typically at level  10-12. He reports that he only experiences breathing issues at 3000 feet. He does have a portable oxygen concentrator which he uses when taking flights, but has not used during regular daily activities over the last two years.   He does complain of some rash flare ups in his right UE. He notes that he is allergic to latex. He does not work with any chemicals and does not wear any gloves with latex. He is unsure if his CPAP mask is composed of latex. He  does follow-up with a dermatologist regularly and sees a PCP every 6 months. His next appointment with his PCP is in two months.  He reports that he takes one capsule of 2000 units of Vitamin D a day. He denies any infection issues, lumps/bumps, unexplained fevers, chills, night sweats, abdominal pain, change in urinary habits, bowel, leg swelling, new headaches, back pain, or abdominal pain.   MEDICAL HISTORY:  Past Medical History:  Diagnosis Date   Age-related macular degeneration, dry, both eyes    B-cell lymphoma [facility]) oncologist--- dr Irene Limbo   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   ED (erectile dysfunction)    Elevated diaphragm    right   Erectile dysfunction    History of basal cell carcinoma (BCC) excision    multiple excision in office   History of kidney stones    History of seizure    03-03-2020 per pt x1 while taking interferan for melanoma in 2001 ,  none since, told a side effect of interferan   History of squamous cell carcinoma excision    multiple skin excision in office   Hyperlipidemia    Hypertension    followed by pcp  (03-03-2020 per pt had a stress test approx. 2010, told normal)   LBBB (left bundle branch block)    Lymphoma of testis ([place]) 2000   s/p right orchiectomy    Mass of left testis    MGUS (monoclonal gammopathy of unknown significance)    oncologist--- dr Irene Limbo   Mixed hyperlipidemia 10/26/2020   Personal history of malignant melanoma of skin dermatologist--- dr Wilhemina Bonito   dx 2001 left calf area  s/p WLE ,  completed one year interferan (03-03-2020 pt states did not involve lymph nodes, no recurrence)   Urgency of urination    Wears hearing aid in both ears     SURGICAL HISTORY: Past Surgical History:  Procedure Laterality Date   CATARACT EXTRACTION W/  INTRAOCULAR LENS IMPLANT Bilateral 2018   IR IMAGING GUIDED PORT INSERTION  03/31/2020   MELANOMA EXCISION  10/1999   ORCHIECTOMY Right 2000   ORCHIECTOMY Left 03/05/2020   Procedure: ORCHIECTOMY;  Surgeon: [name], MD;  Location: [facility];  Service: Urology;  Laterality: Left;   TONSILLECTOMY  child   TOTAL KNEE ARTHROPLASTY Right 06/2009    SOCIAL HISTORY: Social History   Socioeconomic History   Marital status: Married    Spouse name: Not on file   Number of children: Not on file   Years of education: Not on file   Highest education level: Not on file  Occupational History   Not on file  Tobacco Use   Smoking status: Never   Smokeless tobacco: Never  Vaping Use   Vaping Use: Never used  Substance and Sexual Activity   Alcohol use:  Yes    Alcohol/week: 14.0 standard drinks of alcohol    Types: 14 Glasses of wine per week    Comment: 2 wine daily   Drug use: Never   Sexual activity: Not on file  Other Topics Concern   Not on file  Social History Narrative   Social History      Diet?good      Do you drink/eat things with caffeine? seldom      Marital status?          yes                          What year were you married? 1962      Do you live in a house, apartment, assisted living, condo, trailer, etc.? house      Is it one or more stories?1      How many persons live in your home? 2      Do you have any pets in your home? (please list) no      Highest level of education completed? college      Current or past profession: president and [employer]      Do you exercise?   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 yes      Do you have a DNR form?       yes                            If not, do you want to discuss one?      Do you have signed POA/HPOA for forms?  yes      Functional Status      Do you have difficulty bathing or  dressing yourself? no      Do you have difficulty preparing food or eating? no      Do you have difficulty managing your medications? no      Do you have difficulty managing your finances? no      Do you have difficulty affording your medications? no   Social Determinants of Health   Financial Resource Strain: Low Risk  (02/13/2018)   Overall Financial Resource Strain (CARDIA)    Difficulty of Paying Living Expenses: Not hard at all  Food Insecurity: No Food Insecurity (02/13/2018)   Hunger Vital Sign    Worried About Running Out of Food in the Last Year: Never true    Ran Out of Food in the Last Year: Never true  Transportation Needs: No Transportation Needs (02/13/2018)   PRAPARE - Hydrologist (Medical): No    Lack of Transportation (Non-Medical): No  Physical Activity: Insufficiently Active (02/13/2018)   Exercise Vital Sign    Days of Exercise per Week: 2 days    Minutes of Exercise per Session: 20 min  Stress: Stress Concern Present (02/13/2018)   [place]    Feeling of Stress : To some extent  Social Connections: Socially Integrated (02/13/2018)   Social Connection and Isolation Panel [NHANES]    Frequency of Communication with  Friends and Family: More than three times a week    Frequency of Social Gatherings with Friends and Family: More than three times a week    Attends Religious Services: More than 4 times per year    Active Member of [employer] or Organizations: Yes    Attends Music therapist: More than 4 times per year    Marital Status: Married  Human resources officer Violence: Not At Risk (02/13/2018)   Humiliation, Afraid, Rape, and Kick questionnaire    Fear of Current or Ex-Partner: No    Emotionally Abused: No    Physically Abused: No    Sexually Abused: No    FAMILY HISTORY: Family History  Problem Relation Age of Onset   Cataracts Mother    Transient  ischemic attack Mother    Asthma Mother    Cataracts Father    Diabetes Father        borderline   Hypertension Father    Hyperlipidemia Father    Hyperlipidemia Son    Hypertension Son    Amblyopia Neg Hx    Blindness Neg Hx    Glaucoma Neg Hx    Macular degeneration Neg Hx    Retinal detachment Neg Hx    Strabismus Neg Hx    Retinitis pigmentosa Neg Hx     ALLERGIES:  is allergic to latex.  MEDICATIONS:  Current Outpatient Medications  Medication Sig Dispense Refill   aspirin 81 MG EC tablet Take 1 tablet (81 mg total) by mouth daily. Swallow whole. 30 tablet 12   carboxymethylcellulose (REFRESH PLUS) 0.5 % SOLN Place 1 drop into both eyes daily as needed.     Cholecalciferol 50 MCG (2000 UT) CAPS Take 1 capsule by mouth 2 (two) times daily.     dextromethorphan-guaiFENesin (MUCINEX DM) 30-600 MG 12hr tablet Take 1 tablet by mouth 2 (two) times daily as needed.     fluticasone (FLONASE) 50 MCG/ACT nasal spray Place 2 sprays into both nostrils daily. 11.1 mL 5   Homeopathic Products (ZICAM ALLERGY RELIEF NA) Place into the nose as needed. 1 tablet every 3 hours     losartan (COZAAR) 100 MG tablet Take 1 tablet (100 mg total) by mouth daily. 90 tablet 1   meloxicam (MOBIC) 7.5 MG tablet TAKE 1 TABLET BY MOUTH EVERY DAY 90 tablet 1   Multiple Vitamins-Minerals (PRESERVISION AREDS 2 PO) Take 1 capsule by mouth 2 (two) times daily.      propranolol (INDERAL) 10 MG tablet TAKE 1 TABLET BY MOUTH THREE TIMES A DAY 270 tablet 2   simvastatin (ZOCOR) 40 MG tablet TAKE 1 TABLET BY MOUTH EVERY DAY 90 tablet 1   Testosterone 20.25 MG/ACT (1.62%) GEL Apply 40.5 mg topically daily.     valACYclovir (VALTREX) 1000 MG tablet Take 1000 (1 grams) PO 2 times daily for 3 days as needed for flares (herpes)     vitamin B-12 (CYANOCOBALAMIN) 1000 MCG tablet Take 1,000 mcg by mouth daily.     No current facility-administered medications for this visit.    REVIEW OF SYSTEMS:    10 Point review of  Systems was done is negative except as noted above.   PHYSICAL EXAMINATION: ECOG PERFORMANCE STATUS: 0 - Asymptomatic VS reviewed - stable  Vitals:   06/06/22 0957  BP: (!) 169/101  Pulse: 80  Resp: 15  Temp: 97.7 F (36.5 C)  SpO2: 98%    Wt Readings from Last 3 Encounters:  06/06/22 189 lb 8 oz (86 kg)  03/23/22 190 lb 9.6 oz (86.5 kg)  03/04/22 186 lb 9.6 oz (84.6 kg)   Body mass index is 25.7 kg/m.     GENERAL:alert, in no acute distress and comfortable SKIN: no acute rashes, no significant lesions EYES: conjunctiva are pink and non-injected, sclera anicteric OROPHARYNX: MMM, no exudates, no oropharyngeal erythema or ulceration NECK: supple, no JVD LYMPH:  no palpable lymphadenopathy in the cervical, axillary or inguinal regions LUNGS: clear to auscultation b/l with normal respiratory effort HEART: regular rate & rhythm ABDOMEN:  normoactive bowel sounds , non tender, not distended. Extremity: no pedal edema PSYCH: alert & oriented x 3 with fluent speech NEURO: no focal motor/sensory deficits    LABORATORY DATA:  I have reviewed the data as listed  .    Latest Ref Rng & Units 06/06/2022    9:34 AM 02/02/2022    9:37 AM 08/19/2021    9:42 AM  CBC  WBC 4.0 - 10.5 K/uL 6.5  5.4  6.0   Hemoglobin 13.0 - 17.0 g/dL 16.0  15.0  14.9   Hematocrit 39.0 - 52.0 % 46.6  45.8  44.5   Platelets 150 - 400 K/uL 203  194  189    . CBC    Component Value Date/Time   WBC 6.5 06/06/2022 0934   WBC 6.0 08/19/2021 0942   RBC 4.60 06/06/2022 0934   HGB 16.0 06/06/2022 0934   HCT 46.6 06/06/2022 0934   HCT 40.8 12/19/2019 1057   PLT 203 06/06/2022 0934   MCV 101.3 (H) 06/06/2022 0934   MCH 34.8 (H) 06/06/2022 0934   MCHC 34.3 06/06/2022 0934   RDW 13.3 06/06/2022 0934   LYMPHSABS 1.4 06/06/2022 0934   MONOABS 0.7 06/06/2022 0934   EOSABS 0.1 06/06/2022 0934   BASOSABS 0.1 06/06/2022 0934    .    Latest Ref Rng & Units 06/06/2022    9:34 AM 02/02/2022    9:37 AM  12/17/2021   10:03 AM  CMP  Glucose 70 - 99 mg/dL 76  84  93   BUN 8 - 23 mg/dL '16  19  17   '$ Creatinine 0.61 - 1.24 mg/dL 0.90  0.91  0.88   Sodium 135 - 145 mmol/L 140  139  140   Potassium 3.5 - 5.1 mmol/L 4.7  4.5  4.3   Chloride 98 - 111 mmol/L 104  103  102   CO2 22 - 32 mmol/L 32  33  28   Calcium 8.9 - 10.3 mg/dL 9.8  9.4  9.8   Total Protein 6.5 - 8.1 g/dL 6.7  6.6  6.4   Total Bilirubin 0.3 - 1.2 mg/dL 1.0  0.7  0.9   Alkaline Phos 38 - 126 U/L 54  54    AST 15 - 41 U/L '21  21  19   '$ ALT 0 - 44 U/L '24  25  20    '$ . Lab Results  Component Value Date   LDH 118 06/06/2022    03/05/2020 Left Testicular Flow Pathology Report [id number]):    03/05/2020 FISH Panel:    03/05/2020 Left Testicular Mass Surgical Pathology ([id number]):    03/01/18 CBC w/diff:   RADIOGRAPHIC STUDIES: I have personally reviewed the radiological images as listed and agreed with the findings in the report. OCT, Retina - OU - Both Eyes  Result Date: 06/10/2022 Right Eye Quality was good. Central Foveal Thickness: 324. Progression has been stable. Findings include normal foveal contour, no IRF, no SRF, myopic  contour, retinal drusen , outer retinal atrophy (Stable focal areas of outer retinal atrophy / ellipsoid dropout; scattered drusen). Left Eye Quality was good. Central Foveal Thickness: 325. Progression has been stable. Findings include normal foveal contour, no IRF, no SRF, myopic contour, retinal drusen , outer retinal atrophy (Scattered drusen; focal ORA IT to fovea). 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 CME - cystoid macular edema. PED - pigment epithelial detachment. IRF - intraretinal fluid. SRF - subretinal fluid. EZ - ellipsoid zone. ERM - epiretinal membrane. ORA - outer retinal atrophy. ORT - outer retinal tubulation. SRHM -  subretinal hyper-reflective material    03/05/20 of Surgical Pathology ([id number]) SURGICAL PATHOLOGY  CASE: [id number]  PATIENT: [name]  Surgical Pathology Report      Clinical History: Left testicular mass (jmc)      FINAL MICROSCOPIC DIAGNOSIS:   A. TESTICLE AND CORD, LEFT, ORCHIECTOMY:  - Diffuse large B-cell lymphoma  -See comment    COMMENT:   The sections of the testis and spermatic cord nodules show effacement of  the architecture by an atypical lymphoid infiltrate characterized by  predominance of medium and large lymphoid cells with vesicular chromatin  and small nucleoli associated with brisk mitosis.  The appearance is  primarily diffuse with lack of atypical follicles.  Variable number of  admixed smaller lymphocytes are seen. To further evaluate this process,  flow cytometric analysis was performed [id number]) and shows a  monoclonal kappa-restricted B-cell population.  In addition, a battery  of immunohistochemical stains was performed and shows that the atypical  lymphoid cells are positive for CD20, CD79a, PAX 5, CD10 (weak), BCL-2,  MUM-1 and cytoplasmic kappa.  No significant staining is seen with  CD138, cyclin D1, CD30, CD34, TdT, EBV in situ hybridization or  cytoplasmic lambda.  Ki67 shows variable expression ranging from 10% to  >50%.  There is an admixed T-cell population to a lesser extent as seen  with CD3 and CD5 and there is no apparent co-expression of CD5 in B-cell  areas.  The overall features are consistent with involvement by diffuse  large B-cell lymphoma, GCB type.  The results were discussed with Dr.  Jeffie Pollock and Irene Limbo on 03/11/2020.    ASSESSMENT & PLAN:   82 y.o. male with  H/o Rt Primary testicular large B cell lymphoma in 2000 treatment with R-CHOP x 6 cycles as part of clinical trial + IT MTX x 4 cycles and RT to left testicle  2 . H/o Relapsed Left Primary testicular large B cell lymphoma 2021 stage I/IIE. Appears  to be a new event since it is occurring 20 yrs after his previous rt testicular lymphoma S/p 6 cycles of R-CEOP -- currently in remission.   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  PLAN:  -Discussed lab results on 12/09/22  with patient. CBC showed WBC of 7.4K, hemoglobin of 15.1, and platelets of 210K. -CBC stable -CMP normal -LDH normal at 121 -Patient has no lab or clinical evidence of recurrence of his primary testicular large B-cell lymphoma at this time. -Discussed that current medication regimen would not typically trigger rash flare ups  -Recommend patient to  take shorter shower with lukewarm water to limit moisture loss -Patient regularly follows with a dermatologist -Recommend pt connect with medical supply company to recieve new CPAP machine -Continue B12 1000 mcg p.o. daily -Okay to increase dose of vitamin D to two tablets, 4000 units a day which patient is agreeable with -FU in 6 months with labs for continued monitoring -Continue follow-up with pulmonology for continued management of his COVID lung -schedule appointment with PCP 6 months after the next PCP apt in 2 months . Then schedule patient with me every other appointment or as needed  FOLLOW-UP: Return to clinic with Dr. Irene Limbo with labs in 6 months  The total time spent in the appointment was *** minutes* .  All of the patient's questions were answered with apparent satisfaction. The patient knows to call the clinic with any problems, questions or concerns.   Sullivan Lone MD MS AAHIVMS [facility] Hematology/Oncology Physician [facility]  .*Total Encounter Time as defined by the Centers for Medicare and Medicaid Services includes, in addition to the face-to-face time of a patient  visit (documented in the note above) non-face-to-face time: obtaining and reviewing outside history, ordering and reviewing medications, tests or procedures, care coordination (communications with other health care professionals or caregivers) and documentation in the medical record.   I,Mitra Faeizi,acting as a Education administrator for Sullivan Lone, MD.,have documented all relevant documentation on the behalf of Sullivan Lone, MD,as directed by  Sullivan Lone, MD while in the presence of Sullivan Lone, MD.  ***

## 2022-12-12 ENCOUNTER — Telehealth: Payer: Self-pay | Admitting: Hematology

## 2022-12-12 NOTE — Telephone Encounter (Signed)
Called patient per 2/23 los notes to schedule f/u. Patient scheduled and notified.

## 2022-12-15 LAB — MULTIPLE MYELOMA PANEL, SERUM
Albumin SerPl Elph-Mcnc: 4.3 g/dL (ref 2.9–4.4)
Albumin/Glob SerPl: 2.1 — ABNORMAL HIGH (ref 0.7–1.7)
Alpha 1: 0.2 g/dL (ref 0.0–0.4)
Alpha2 Glob SerPl Elph-Mcnc: 0.5 g/dL (ref 0.4–1.0)
B-Globulin SerPl Elph-Mcnc: 0.7 g/dL (ref 0.7–1.3)
Gamma Glob SerPl Elph-Mcnc: 0.7 g/dL (ref 0.4–1.8)
Globulin, Total: 2.1 g/dL — ABNORMAL LOW (ref 2.2–3.9)
IgA: 86 mg/dL (ref 61–437)
IgG (Immunoglobin G), Serum: 616 mg/dL (ref 603–1613)
IgM (Immunoglobulin M), Srm: 380 mg/dL — ABNORMAL HIGH (ref 15–143)
M Protein SerPl Elph-Mcnc: 0.2 g/dL — ABNORMAL HIGH
Total Protein ELP: 6.4 g/dL (ref 6.0–8.5)

## 2023-01-03 DIAGNOSIS — G4733 Obstructive sleep apnea (adult) (pediatric): Secondary | ICD-10-CM | POA: Diagnosis not present

## 2023-01-04 ENCOUNTER — Telehealth: Payer: Self-pay | Admitting: Hematology

## 2023-01-04 NOTE — Telephone Encounter (Signed)
Called patient per provider PAL to reschedule 8/30 appointments. Patient rescheduled and notified.

## 2023-01-05 DIAGNOSIS — U071 COVID-19: Secondary | ICD-10-CM | POA: Diagnosis not present

## 2023-01-05 DIAGNOSIS — G4733 Obstructive sleep apnea (adult) (pediatric): Secondary | ICD-10-CM | POA: Diagnosis not present

## 2023-01-30 ENCOUNTER — Other Ambulatory Visit: Payer: Self-pay | Admitting: Internal Medicine

## 2023-01-30 ENCOUNTER — Other Ambulatory Visit: Payer: Self-pay | Admitting: Nurse Practitioner

## 2023-02-03 DIAGNOSIS — E291 Testicular hypofunction: Secondary | ICD-10-CM | POA: Diagnosis not present

## 2023-02-05 DIAGNOSIS — G4733 Obstructive sleep apnea (adult) (pediatric): Secondary | ICD-10-CM | POA: Diagnosis not present

## 2023-02-05 DIAGNOSIS — U071 COVID-19: Secondary | ICD-10-CM | POA: Diagnosis not present

## 2023-02-15 DIAGNOSIS — L820 Inflamed seborrheic keratosis: Secondary | ICD-10-CM | POA: Diagnosis not present

## 2023-02-15 DIAGNOSIS — C44319 Basal cell carcinoma of skin of other parts of face: Secondary | ICD-10-CM | POA: Diagnosis not present

## 2023-02-15 DIAGNOSIS — L570 Actinic keratosis: Secondary | ICD-10-CM | POA: Diagnosis not present

## 2023-02-15 DIAGNOSIS — C4442 Squamous cell carcinoma of skin of scalp and neck: Secondary | ICD-10-CM | POA: Diagnosis not present

## 2023-02-15 DIAGNOSIS — L814 Other melanin hyperpigmentation: Secondary | ICD-10-CM | POA: Diagnosis not present

## 2023-02-15 DIAGNOSIS — D485 Neoplasm of uncertain behavior of skin: Secondary | ICD-10-CM | POA: Diagnosis not present

## 2023-02-15 DIAGNOSIS — Z85828 Personal history of other malignant neoplasm of skin: Secondary | ICD-10-CM | POA: Diagnosis not present

## 2023-02-15 DIAGNOSIS — E291 Testicular hypofunction: Secondary | ICD-10-CM | POA: Diagnosis not present

## 2023-02-15 DIAGNOSIS — D0439 Carcinoma in situ of skin of other parts of face: Secondary | ICD-10-CM | POA: Diagnosis not present

## 2023-02-15 DIAGNOSIS — Z85820 Personal history of malignant melanoma of skin: Secondary | ICD-10-CM | POA: Diagnosis not present

## 2023-02-21 ENCOUNTER — Encounter: Payer: Self-pay | Admitting: Internal Medicine

## 2023-02-21 ENCOUNTER — Non-Acute Institutional Stay: Payer: PPO | Admitting: Internal Medicine

## 2023-02-21 VITALS — BP 138/82 | HR 76 | Temp 97.9°F | Resp 17 | Ht 72.0 in | Wt 186.2 lb

## 2023-02-21 DIAGNOSIS — L299 Pruritus, unspecified: Secondary | ICD-10-CM

## 2023-02-21 DIAGNOSIS — G250 Essential tremor: Secondary | ICD-10-CM

## 2023-02-21 DIAGNOSIS — R2689 Other abnormalities of gait and mobility: Secondary | ICD-10-CM | POA: Diagnosis not present

## 2023-02-21 DIAGNOSIS — E349 Endocrine disorder, unspecified: Secondary | ICD-10-CM

## 2023-02-21 DIAGNOSIS — I10 Essential (primary) hypertension: Secondary | ICD-10-CM

## 2023-02-21 DIAGNOSIS — Z8572 Personal history of non-Hodgkin lymphomas: Secondary | ICD-10-CM | POA: Diagnosis not present

## 2023-02-21 DIAGNOSIS — E7800 Pure hypercholesterolemia, unspecified: Secondary | ICD-10-CM | POA: Diagnosis not present

## 2023-02-21 NOTE — Patient Instructions (Signed)
Skin Itching Try Some OTC Hydrocortisone Ointment Also Use Eucerin or Cetaphil for dry Skin Constipation Use OTC Stool Softener Colace at night  If that doesn't work try Senna Plus at night Can also use Miralax as needed Drink Water   If Balance issue gets worse let me know Use Hiking Cane when go out

## 2023-02-21 NOTE — Progress Notes (Signed)
Location:  [facility] features editor of Service:  Clinic (12)  Provider:   Code Status:  Goals of Care:     02/21/2023    1:00 PM  Advanced Directives  Does Patient Have a Medical Advance Directive? Yes  Type of Estate agent of [place];Living will;Out of facility DNR (pink MOST or yellow form)  Does patient want to make changes to medical advance directive? No - Patient declined     Chief Complaint  Patient presents with   Medical Management of Chronic Issues    Patient is here for a month follow up   Quality Metric Gaps    Patient is due for AWV    HPI: Patient is a 82 y.o. male seen today for medical management of chronic diseases.   [place] in [place]  Patient has a history of COVID-pneumonia in January 2022. 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   ED (erectile dysfunction)    Elevated diaphragm    right   Erectile dysfunction    History of basal cell carcinoma (BCC) excision     multiple excision in office   History of kidney stones    History of seizure    03-03-2020 per pt x1 while taking interferan for melanoma in 2001 ,  none since, told a side effect of interferan   History of squamous cell carcinoma excision    multiple skin excision in office   Hyperlipidemia    Hypertension    followed by pcp  (03-03-2020 per pt had a stress test approx. 2010, told normal)   LBBB (left bundle branch block)    Lymphoma of testis (HCC) 2000   s/p right orchiectomy    Mass of left testis    MGUS (monoclonal gammopathy of unknown significance)    oncologist--- dr Candise Che   Mixed hyperlipidemia 10/26/2020   Personal history of malignant melanoma of skin dermatologist--- dr Karlyn Agee   dx 2001 left calf area  s/p WLE ,  completed one year interferan (03-03-2020 pt states did not involve lymph nodes, no recurrence)   Urgency of urination    Wears hearing aid in both ears     Past Surgical History:  Procedure Laterality Date   CATARACT EXTRACTION W/ INTRAOCULAR LENS IMPLANT Bilateral 2018   IR IMAGING GUIDED PORT INSERTION  03/31/2020   MELANOMA EXCISION  10/1999   ORCHIECTOMY Right 2000   ORCHIECTOMY Left 03/05/2020   Procedure: ORCHIECTOMY;  Surgeon: Bjorn Pippin, MD;  Location:  [facility];  Service: Urology;  Laterality: Left;   TONSILLECTOMY  child   TOTAL KNEE ARTHROPLASTY Right 06/2009    Allergies  Allergen Reactions   Latex Itching    Latex tape causes itching and redness.    Outpatient Encounter Medications as of 02/21/2023  Medication Sig   aspirin 81 MG EC tablet Take 1 tablet (81 mg total) by mouth daily. Swallow whole.   carboxymethylcellulose (REFRESH PLUS) 0.5 % SOLN Place 1 drop into both eyes daily as needed.   Cholecalciferol 50 MCG (2000 UT) CAPS Take 1 capsule by mouth 2 (two) times daily.   fluticasone (FLONASE) 50 MCG/ACT nasal spray Place 2 sprays into both nostrils daily.   losartan (COZAAR) 100 MG tablet TAKE 1 TABLET BY MOUTH  EVERY DAY   meloxicam (MOBIC) 7.5 MG tablet TAKE 1 TABLET BY MOUTH EVERY DAY   Multiple Vitamins-Minerals (PRESERVISION AREDS 2 PO) Take 1 capsule by mouth 2 (two) times daily.    propranolol (INDERAL) 10 MG tablet TAKE 1 TABLET BY MOUTH THREE TIMES A DAY   simvastatin (ZOCOR) 40 MG tablet TAKE 1 TABLET BY MOUTH EVERY DAY   Testosterone 20.25 MG/ACT (1.62%) GEL Apply 40.5 mg topically daily.   valACYclovir (VALTREX) 1000 MG tablet Take 1 tablet (1,000 mg total) by mouth 2 (two) times daily. Take 1000 (1 grams) PO 2 times daily for 3 days as needed for flares (herpes)   vitamin B-12 (CYANOCOBALAMIN) 1000 MCG tablet Take 1,000 mcg by mouth daily.   No facility-administered encounter medications on file as of 02/21/2023.    Review of Systems:  Review of Systems  Constitutional:  Negative for activity change, appetite change and unexpected weight change.  HENT: Negative.    Respiratory:  Positive for cough and shortness of breath.   Cardiovascular:  Negative for leg swelling.  Gastrointestinal:  Positive for constipation.  Genitourinary:  Negative for frequency.  Musculoskeletal:  Negative for arthralgias, gait problem and myalgias.  Skin: Negative.  Negative for rash.  Neurological:  Positive for tremors. Negative for dizziness and weakness.  Psychiatric/Behavioral:  Negative for confusion and sleep disturbance.   All other systems reviewed and are negative.   Health Maintenance  Topic Date Due   Medicare Annual Wellness (AWV)  08/13/2021   COVID-19 Vaccine (7 - 2023-24 season) 04/06/2023   INFLUENZA VACCINE  05/18/2023   DTaP/Tdap/Td (2 - Td or Tdap) 03/04/2024   Pneumonia Vaccine 15+ Years old  Completed   Zoster Vaccines- Shingrix  Completed   HPV VACCINES  Aged Out    Physical Exam: Vitals:   02/21/23 1256  BP: 138/82  Pulse: 76  Resp: 17  Temp: 97.9 F (36.6 C)  TempSrc: Temporal  SpO2: 98%  Weight: 186 lb 3.2 oz (84.5 kg)  Height: 6' (1.829 m)   Body mass index is  25.25 kg/m. Physical Exam Vitals reviewed.  Constitutional:      Appearance: Normal appearance.  HENT:     Head: Normocephalic.     Nose: Nose normal.     Mouth/Throat:     Mouth: Mucous membranes are moist.     Pharynx: Oropharynx is clear.  Eyes:     Pupils: Pupils are equal, round, and reactive to light.  Cardiovascular:     Rate and Rhythm: Normal rate and regular rhythm.     Pulses: Normal pulses.     Heart sounds: No murmur heard. Pulmonary:     Effort: Pulmonary effort is normal. No respiratory distress.  Breath sounds: Normal breath sounds. No rales.  Abdominal:     General: Abdomen is flat. Bowel sounds are normal.     Palpations: Abdomen is soft.  Musculoskeletal:        General: No swelling.     Cervical back: Neck supple.  Skin:    General: Skin is warm.  Neurological:     General: No focal deficit present.     Mental Status: He is alert and oriented to person, place, and time.     Comments: No Gross Peripheral Sensory loss Essential Tremor present Romberg was negative   Psychiatric:        Mood and Affect: Mood normal.        Thought Content: Thought content normal.     Labs reviewed: Basic Metabolic Panel: Recent Labs    06/06/22 0934 12/09/22 1302  NA 140 139  K 4.7 4.5  CL 104 102  CO2 32 32  GLUCOSE 76 132*  BUN 16 18  CREATININE 0.90 0.86  CALCIUM 9.8 8.8*   Liver Function Tests: Recent Labs    06/06/22 0934 12/09/22 1302  AST 21 19  ALT 24 24  ALKPHOS 54 51  BILITOT 1.0 1.0  PROT 6.7 6.8  ALBUMIN 4.5 4.4   No results for input(s): "LIPASE", "AMYLASE" in the last 8760 hours. No results for input(s): "AMMONIA" in the last 8760 hours. CBC: Recent Labs    06/06/22 0934 12/09/22 1302  WBC 6.5 7.4  NEUTROABS 4.3 5.0  HGB 16.0 15.1  HCT 46.6 44.6  MCV 101.3* 102.8*  PLT 203 210   Lipid Panel: No results for input(s): "CHOL", "HDL", "LDLCALC", "TRIG", "CHOLHDL", "LDLDIRECT" in the last 8760 hours. Lab Results   Component Value Date   HGBA1C 5.7 02/27/2020    Procedures since last visit: No results found.  Assessment/Plan 1. Essential hypertension Cozaar  2. History of B-cell lymphoma Follows with Dr Candise Che Doing well iN Remission  3. Pure hypercholesterolemia On Zocor Needs Lipid panel  4. Itchy skin Use OTC Hydrocortisone and Eucerin for dry skin  5. Balance problem Exam was normal Try Hiking Stick when hiking If any change let me know Therapy Eval  6. Benign essential tremor Propanolol PRN  7. Post Covid ILD Uses PRN Oxygen when hiking and in flight Cannot walk in [place] without getting SOB  8. Testosterone deficiency Apply topically Per Urology 9 Constipation Try Senokot plus or Colace Increase Water intake  10  CAD per imaging On Aspirin and Statin  Labs/tests ordered:  Lipid,TSH,before next app Next appt:  Visit date not found

## 2023-03-07 DIAGNOSIS — U071 COVID-19: Secondary | ICD-10-CM | POA: Diagnosis not present

## 2023-03-07 DIAGNOSIS — G4733 Obstructive sleep apnea (adult) (pediatric): Secondary | ICD-10-CM | POA: Diagnosis not present

## 2023-03-10 ENCOUNTER — Encounter (INDEPENDENT_AMBULATORY_CARE_PROVIDER_SITE_OTHER): Payer: PPO | Admitting: Ophthalmology

## 2023-03-10 ENCOUNTER — Encounter (INDEPENDENT_AMBULATORY_CARE_PROVIDER_SITE_OTHER): Payer: Self-pay

## 2023-03-10 DIAGNOSIS — H26493 Other secondary cataract, bilateral: Secondary | ICD-10-CM

## 2023-03-10 DIAGNOSIS — H35363 Drusen (degenerative) of macula, bilateral: Secondary | ICD-10-CM | POA: Diagnosis not present

## 2023-03-10 DIAGNOSIS — H40053 Ocular hypertension, bilateral: Secondary | ICD-10-CM

## 2023-03-10 DIAGNOSIS — Z961 Presence of intraocular lens: Secondary | ICD-10-CM

## 2023-03-10 DIAGNOSIS — H353132 Nonexudative age-related macular degeneration, bilateral, intermediate dry stage: Secondary | ICD-10-CM

## 2023-03-20 DIAGNOSIS — Z85828 Personal history of other malignant neoplasm of skin: Secondary | ICD-10-CM | POA: Diagnosis not present

## 2023-03-20 DIAGNOSIS — D0439 Carcinoma in situ of skin of other parts of face: Secondary | ICD-10-CM | POA: Diagnosis not present

## 2023-03-20 DIAGNOSIS — D485 Neoplasm of uncertain behavior of skin: Secondary | ICD-10-CM | POA: Diagnosis not present

## 2023-03-20 DIAGNOSIS — D045 Carcinoma in situ of skin of trunk: Secondary | ICD-10-CM | POA: Diagnosis not present

## 2023-03-20 DIAGNOSIS — L821 Other seborrheic keratosis: Secondary | ICD-10-CM | POA: Diagnosis not present

## 2023-03-20 DIAGNOSIS — L570 Actinic keratosis: Secondary | ICD-10-CM | POA: Diagnosis not present

## 2023-03-20 DIAGNOSIS — Z85820 Personal history of malignant melanoma of skin: Secondary | ICD-10-CM | POA: Diagnosis not present

## 2023-04-07 DIAGNOSIS — G4733 Obstructive sleep apnea (adult) (pediatric): Secondary | ICD-10-CM | POA: Diagnosis not present

## 2023-04-07 DIAGNOSIS — U071 COVID-19: Secondary | ICD-10-CM | POA: Diagnosis not present

## 2023-04-11 DIAGNOSIS — G4733 Obstructive sleep apnea (adult) (pediatric): Secondary | ICD-10-CM | POA: Diagnosis not present

## 2023-04-12 NOTE — Progress Notes (Signed)
[facility] - Clinic Note  04/14/2023     CHIEF COMPLAINT Patient presents for Retina Follow Up   HISTORY OF PRESENT ILLNESS: Antonio [name] is a 82 y.o. male who presents to the clinic today for:   HPI     Retina Follow Up   Patient presents with  Dry AMD.  In both eyes.  This started years ago.  Duration of 9 months.  I, the attending physician,  performed the HPI with the patient and updated documentation appropriately.        Comments   Patient feels that there has been no changes in his vision. He is using AT's OU PRN.       Last edited by Rennis Chris, MD on 04/16/2023 12:49 PM.    Pt states vision is stable, pt does not have a general ophthalmologist yet, but he has an appt at [facility] next Tuesday   Referring physician: Mahlon Gammon, MD [address] 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 Vivien Presto, [place], [place]);  PMH - HTN; [facility] - ARMD - Mother   CURRENT MEDICATIONS: Current Outpatient Medications (Ophthalmic Drugs)  Medication Sig   carboxymethylcellulose (REFRESH PLUS) 0.5 % SOLN Place 1 drop into both eyes daily as needed.   No current facility-administered medications for this visit. (Ophthalmic Drugs)   Current Outpatient Medications (Other)  Medication Sig   aspirin 81 MG EC tablet Take 1 tablet (81 mg total) by mouth daily. Swallow whole.   Cholecalciferol 50 MCG (2000 UT) CAPS Take 1 capsule by mouth 2 (two) times daily.   fluticasone (FLONASE) 50 MCG/ACT nasal spray Place 2 sprays into both nostrils daily.   losartan (COZAAR) 100 MG tablet TAKE 1 TABLET BY MOUTH EVERY DAY   meloxicam (MOBIC) 7.5 MG tablet TAKE 1 TABLET BY MOUTH EVERY DAY   Multiple Vitamins-Minerals (PRESERVISION AREDS 2 PO) Take 1 capsule by  mouth 2 (two) times daily.    propranolol (INDERAL) 10 MG tablet TAKE 1 TABLET BY MOUTH THREE TIMES A DAY   simvastatin (ZOCOR) 40 MG tablet TAKE 1 TABLET BY MOUTH EVERY DAY   Testosterone 20.25 MG/ACT (1.62%) GEL Apply 40.5 mg topically daily.   valACYclovir (VALTREX) 1000 MG tablet Take 1 tablet (1,000 mg total) by mouth 2 (two) times daily. Take 1000 (1 grams) PO 2 times daily for 3 days as needed for flares (herpes)   vitamin B-12 (CYANOCOBALAMIN) 1000 MCG tablet Take 1,000 mcg by mouth daily.   No current facility-administered medications for this visit. (Other)   REVIEW OF SYSTEMS: ROS   Positive for: Neurological, Genitourinary, Musculoskeletal, Endocrine, Cardiovascular, Eyes, Heme/Lymph Negative for: Constitutional, Gastrointestinal, Skin, HENT, Respiratory, Psychiatric, Allergic/Imm Last edited by Charlette Caffey, COT on 04/14/2023  9:01 AM.       ALLERGIES Allergies  Allergen Reactions   Latex Itching    Latex tape causes itching and redness.    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   ED (erectile dysfunction)    Elevated diaphragm    right   Erectile dysfunction    History of basal cell carcinoma (BCC) excision    multiple excision in office   History of kidney stones    History of seizure    03-03-2020 per pt x1 while taking interferan for melanoma in 2001 ,  none since, told a side effect of interferan   History of squamous cell carcinoma excision    multiple skin excision in office   Hyperlipidemia    Hypertension    followed by pcp  (03-03-2020 per pt had a stress test approx. 2010, told normal)   LBBB (left  bundle branch block)    Lymphoma of testis (HCC) 2000   s/p right orchiectomy    Mass of left testis    MGUS (monoclonal gammopathy of unknown significance)    oncologist--- dr Candise Che   Mixed hyperlipidemia 10/26/2020   Personal history of malignant melanoma of skin dermatologist--- dr Karlyn Agee   dx 2001 left calf area  s/p WLE ,  completed one year interferan (03-03-2020 pt states did not involve lymph nodes, no recurrence)   Urgency of urination    Wears hearing aid in both ears    Past Surgical History:  Procedure Laterality Date   CATARACT EXTRACTION W/ INTRAOCULAR LENS IMPLANT Bilateral 2018   IR IMAGING GUIDED PORT INSERTION  03/31/2020   MELANOMA EXCISION  10/1999   ORCHIECTOMY Right 2000   ORCHIECTOMY Left 03/05/2020   Procedure: ORCHIECTOMY;  Surgeon: Bjorn Pippin, MD;  Location: [facility];  Service: Urology;  Laterality: Left;   TONSILLECTOMY  child   TOTAL KNEE ARTHROPLASTY Right 06/2009   FAMILY HISTORY Family History  Problem Relation Age of Onset   Cataracts Mother    Transient ischemic attack Mother    Asthma Mother    Cataracts Father    Diabetes Father        borderline   Hypertension Father    Hyperlipidemia Father    Hyperlipidemia Son    Hypertension Son    Amblyopia Neg Hx    Blindness Neg Hx    Glaucoma Neg Hx    Macular degeneration Neg Hx    Retinal detachment Neg Hx    Strabismus Neg Hx    Retinitis pigmentosa Neg Hx    SOCIAL HISTORY Social History   Tobacco Use   Smoking status: Never   Smokeless tobacco: Never  Vaping Use   Vaping Use: Never used  Substance Use Topics   Alcohol use: Yes    Alcohol/week: 14.0 standard drinks of alcohol    Types: 14 Glasses of wine per week    Comment: 2 wine daily   Drug use: Never       OPHTHALMIC EXAM: Base Eye Exam     Visual Acuity (Snellen - Linear)       Right Left   Dist cc 20/20 20/20    Correction: Glasses         Tonometry (Tonopen, 9:03 AM)       Right Left    Pressure 18 18         Pupils       Dark Light Shape React APD   Right 3 2 Round Brisk None   Left 3 2 Round Brisk None         Visual Fields       Left Right    Full Full  Extraocular Movement       Right Left    Full, Ortho Full, Ortho         Neuro/Psych     Oriented x3: Yes   Mood/Affect: Normal         Dilation     Both eyes: 1.0% Mydriacyl, 2.5% Phenylephrine @ 9:02 AM           Slit Lamp and Fundus Exam     External Exam       Right Left   External Brow ptosis - mild Brow ptosis - mild         Slit Lamp Exam       Right Left   Lids/Lashes Dermatochalasis - upper lid Dermatochalasis - upper lid   Conjunctiva/Sclera White and quiet White and quiet   Cornea Arcus, Well healed temporal cataract wounds Arcus, Well healed cataract wounds, trace tear film debris   Anterior Chamber deep and clear deep and clear   Iris Round and dilated Round and dilated   Lens toric Posterior chamber intraocular lens in good postion with marks at 0300 and 0900, trace Posterior capsular opacification Toric Posterior chamber intraocular lens in good position with marks at 0200 and 0800; 1+Posterior capsular opacification   Anterior Vitreous Vitreous syneresis, Posterior vitreous detachment, vitreous condensations Vitreous syneresis, Posterior vitreous detachment, vitreous condensations         Fundus Exam       Right Left   Disc Mild pallor, sharp rim, Compact, Tilted with Peripapillary atrophy 360 compact, mild tilt, Peripapillary atrophy 360, mild Pallor, Sharp rim   C/D Ratio 0.4 0.3   Macula Flat, Blunted foveal reflex, Drusen, RPE mottling, clumping and atrophy - stable and mild, No heme or edema, focal atrophy Good foveal reflex, Drusen, RPE mottling, clumping and atrophy - stable and mild, No heme or edema, focal atrophy   Vessels attenuated, mild tortuosity attenuated, mild tortuosity   Periphery Attached; scattered Reticular degeneration,  No heme Attached; scattered Reticular degeneration, No heme           Refraction     Wearing Rx       Sphere Cylinder Axis Add   Right -0.75 +0.50 106 +2.50   Left -0.50 +0.75 121 +2.50    Type: PAL            IMAGING AND PROCEDURES  Imaging and Procedures for 11/10/17  OCT, Retina - OU - Both Eyes       Right Eye Quality was good. Central Foveal Thickness: 317. Progression has been stable. Findings include normal foveal contour, no IRF, no SRF, myopic contour, retinal drusen , outer retinal atrophy (Stable focal areas of outer retinal atrophy / ellipsoid dropout; scattered drusen).   Left Eye Quality was good. Central Foveal Thickness: 322. Progression has been stable. Findings include normal foveal contour, no IRF, no SRF, myopic contour, retinal drusen , outer retinal atrophy (Scattered drusen; focal ORA IT to fovea).   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 CME - cystoid macular edema. PED - pigment epithelial detachment. IRF - intraretinal fluid. SRF - subretinal fluid. EZ - ellipsoid zone. ERM - epiretinal membrane. ORA - outer retinal atrophy. ORT - outer retinal tubulation. SRHM - subretinal hyper-reflective material             ASSESSMENT/PLAN:    ICD-10-CM  1. Intermediate stage nonexudative age-related macular degeneration of both eyes  H35.3132 OCT, Retina - OU - Both Eyes    2. Pseudophakia of both eyes  Z96.1     3. 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 Bilateral posterior capsular opacification  H26.493      1. Age related macular degeneration, non-exudative, both eyes   - intermediate stage -- stable  - The incidence, anatomy, and pathology of dry AMD, risk of progression, and the AREDS and AREDS 2 study including smoking risks discussed with patient.   - no  longer using Foresee AMD monitoring device due to insurance issues  - BCVA 20/20 OU  - no significant progression on exam or OCT -- scattered drusen and ORA  - f/u 9 months, sooner PRN -- DFE/OCT  2. Pseudophakia OU  - s/p CE/PCIOL OU (Toric + femto) 12/2016 by Dr. Vivien Presto in [facility]  - had post op IOP issue OS  - beautiful surgeries, doing well  - monitor   3. H/o ocular hypertension OU  - IOP good today (18 OU) off combigan  - monitor   4. PCO OU  - not yet visually significant  - monitor for now  Ophthalmic Meds Ordered this visit:  No orders of the defined types were placed in this encounter.    Return in about 9 months (around 01/12/2024) for f/u non-exu ARMD OU, DFE, OCT.  There are no Patient Instructions on file for this visit.  This document serves as a record of services personally performed by Karie Chimera, MD, PhD. It was created on their behalf by Berlin Hun COT, an ophthalmic technician. The creation of this record is the provider's dictation and/or activities during the visit.    Electronically signed by: Berlin Hun COT 06.26.202412:51 PM  This document serves as a record of services personally performed by Karie Chimera, MD, PhD. It was created on their behalf by Glee Arvin. Manson Passey, OA an ophthalmic technician. The creation of this record is the provider's dictation and/or activities during the visit.    Electronically signed by: Glee Arvin. Manson Passey, OA 04/16/23 12:51 PM  Karie Chimera, M.D., Ph.D. Diseases & Surgery of the Retina and Vitreous [facility] 04/14/2023    I have reviewed the above documentation for accuracy and completeness, and I agree with the above. Karie Chimera, M.D., Ph.D. 04/16/23 12:53 PM\  Abbreviations: M myopia (nearsighted); A astigmatism; H hyperopia (farsighted); P presbyopia; Mrx spectacle prescription;  CTL contact lenses; OD right eye; OS left eye; OU both eyes  XT exotropia; ET  esotropia; PEK punctate epithelial keratitis; PEE punctate epithelial erosions; DES dry eye syndrome; MGD meibomian gland dysfunction; ATs artificial tears; PFAT's preservative free artificial tears; NSC nuclear sclerotic cataract; PSC posterior subcapsular cataract; ERM epi-retinal membrane; PVD posterior vitreous detachment; RD retinal detachment; DM diabetes mellitus; DR diabetic retinopathy; NPDR non-proliferative diabetic retinopathy; PDR proliferative diabetic retinopathy; CSME clinically significant macular edema; DME diabetic macular edema; dbh dot blot hemorrhages; CWS cotton wool spot; POAG primary open angle glaucoma; C/D cup-to-disc ratio; HVF humphrey visual field; GVF goldmann visual field; OCT optical coherence tomography; IOP intraocular pressure; BRVO Branch retinal vein occlusion; CRVO central retinal vein occlusion; CRAO central retinal artery occlusion; BRAO branch retinal artery occlusion; RT retinal tear; SB scleral buckle; PPV pars plana vitrectomy; VH Vitreous hemorrhage; PRP panretinal laser photocoagulation; IVK intravitreal kenalog; VMT vitreomacular traction; MH Macular hole;  NVD neovascularization of the disc; NVE  neovascularization elsewhere; AREDS age related eye disease study; ARMD age related macular degeneration; POAG primary open angle glaucoma; EBMD epithelial/anterior basement membrane dystrophy; ACIOL anterior chamber intraocular lens; IOL intraocular lens; PCIOL posterior chamber intraocular lens; Phaco/IOL phacoemulsification with intraocular lens placement; Morton photorefractive keratectomy; LASIK laser assisted in situ keratomileusis; HTN hypertension; DM diabetes mellitus; COPD chronic obstructive pulmonary disease

## 2023-04-14 ENCOUNTER — Ambulatory Visit (INDEPENDENT_AMBULATORY_CARE_PROVIDER_SITE_OTHER): Payer: PPO | Admitting: Ophthalmology

## 2023-04-14 ENCOUNTER — Encounter (INDEPENDENT_AMBULATORY_CARE_PROVIDER_SITE_OTHER): Payer: Self-pay | Admitting: Ophthalmology

## 2023-04-14 DIAGNOSIS — H26493 Other secondary cataract, bilateral: Secondary | ICD-10-CM

## 2023-04-14 DIAGNOSIS — H353132 Nonexudative age-related macular degeneration, bilateral, intermediate dry stage: Secondary | ICD-10-CM

## 2023-04-14 DIAGNOSIS — H40053 Ocular hypertension, bilateral: Secondary | ICD-10-CM | POA: Diagnosis not present

## 2023-04-14 DIAGNOSIS — Z961 Presence of intraocular lens: Secondary | ICD-10-CM | POA: Diagnosis not present

## 2023-04-16 ENCOUNTER — Encounter (INDEPENDENT_AMBULATORY_CARE_PROVIDER_SITE_OTHER): Payer: Self-pay | Admitting: Ophthalmology

## 2023-04-19 ENCOUNTER — Other Ambulatory Visit: Payer: Self-pay | Admitting: Internal Medicine

## 2023-04-19 ENCOUNTER — Other Ambulatory Visit: Payer: Self-pay | Admitting: Adult Health

## 2023-05-07 DIAGNOSIS — U071 COVID-19: Secondary | ICD-10-CM | POA: Diagnosis not present

## 2023-05-07 DIAGNOSIS — G4733 Obstructive sleep apnea (adult) (pediatric): Secondary | ICD-10-CM | POA: Diagnosis not present

## 2023-05-24 ENCOUNTER — Other Ambulatory Visit: Payer: Self-pay | Admitting: Internal Medicine

## 2023-05-24 MED ORDER — CLOBETASOL PROPIONATE 0.05 % EX SOLN: 1.0000 | Freq: Two times a day (BID) | CUTANEOUS | 0 refills | Status: DC

## 2023-06-07 DIAGNOSIS — U071 COVID-19: Secondary | ICD-10-CM | POA: Diagnosis not present

## 2023-06-07 DIAGNOSIS — G4733 Obstructive sleep apnea (adult) (pediatric): Secondary | ICD-10-CM | POA: Diagnosis not present

## 2023-06-08 ENCOUNTER — Other Ambulatory Visit: Payer: Self-pay

## 2023-06-08 DIAGNOSIS — C8339 Diffuse large B-cell lymphoma, extranodal and solid organ sites: Secondary | ICD-10-CM

## 2023-06-09 ENCOUNTER — Inpatient Hospital Stay (HOSPITAL_BASED_OUTPATIENT_CLINIC_OR_DEPARTMENT_OTHER): Payer: PPO | Admitting: Hematology

## 2023-06-09 ENCOUNTER — Inpatient Hospital Stay: Payer: PPO | Attending: Hematology

## 2023-06-09 VITALS — BP 154/87 | HR 62 | Temp 97.7°F | Resp 17 | Ht 72.0 in | Wt 185.8 lb

## 2023-06-09 DIAGNOSIS — D472 Monoclonal gammopathy: Secondary | ICD-10-CM | POA: Diagnosis not present

## 2023-06-09 DIAGNOSIS — C8339 Diffuse large B-cell lymphoma, extranodal and solid organ sites: Secondary | ICD-10-CM

## 2023-06-09 DIAGNOSIS — Z85820 Personal history of malignant melanoma of skin: Secondary | ICD-10-CM | POA: Insufficient documentation

## 2023-06-09 LAB — CMP (CANCER CENTER ONLY)
ALT: 24 U/L (ref 0–44)
AST: 23 U/L (ref 15–41)
Albumin: 4.6 g/dL (ref 3.5–5.0)
Alkaline Phosphatase: 56 U/L (ref 38–126)
Anion gap: 5 (ref 5–15)
BUN: 14 mg/dL (ref 8–23)
CO2: 31 mmol/L (ref 22–32)
Calcium: 9.2 mg/dL (ref 8.9–10.3)
Chloride: 102 mmol/L (ref 98–111)
Creatinine: 0.84 mg/dL (ref 0.61–1.24)
GFR, Estimated: >60 mL/min (ref >60)
Glucose, Bld: 82 mg/dL (ref 70–99)
Potassium: 4.2 mmol/L (ref 3.5–5.1)
Sodium: 138 mmol/L (ref 135–145)
Total Bilirubin: 1.3 mg/dL — ABNORMAL HIGH (ref 0.3–1.2)
Total Protein: 7.2 g/dL (ref 6.5–8.1)

## 2023-06-09 LAB — CBC WITH DIFFERENTIAL (CANCER CENTER ONLY)
Abs Immature Granulocytes: 0.02 10*3/uL (ref 0.00–0.07)
Basophils Absolute: 0.1 10*3/uL (ref 0.0–0.1)
Basophils Relative: 1 %
Eosinophils Absolute: 0.1 10*3/uL (ref 0.0–0.5)
Eosinophils Relative: 1 %
HCT: 48.3 % (ref 39.0–52.0)
Hemoglobin: 16.3 g/dL (ref 13.0–17.0)
Immature Granulocytes: 0 %
Lymphocytes Relative: 23 %
Lymphs Abs: 1.5 10*3/uL (ref 0.7–4.0)
MCH: 35.2 pg — ABNORMAL HIGH (ref 26.0–34.0)
MCHC: 33.7 g/dL (ref 30.0–36.0)
MCV: 104.3 fL — ABNORMAL HIGH (ref 80.0–100.0)
Monocytes Absolute: 0.8 10*3/uL (ref 0.1–1.0)
Monocytes Relative: 13 %
Neutro Abs: 3.9 10*3/uL (ref 1.7–7.7)
Neutrophils Relative %: 62 %
Platelet Count: 193 10*3/uL (ref 150–400)
RBC: 4.63 MIL/uL (ref 4.22–5.81)
RDW: 13.2 % (ref 11.5–15.5)
WBC Count: 6.4 10*3/uL (ref 4.0–10.5)
nRBC: 0 % (ref 0.0–0.2)

## 2023-06-09 LAB — LACTATE DEHYDROGENASE: LDH: 132 U/L (ref 98–192)

## 2023-06-09 NOTE — Progress Notes (Signed)
HEMATOLOGY/ONCOLOGY CLINIC NOTE  Date of Service: 06/09/23    Patient Care Team: Mahlon Gammon, MD as PCP - General (Internal Medicine)  CHIEF COMPLAINTS/PURPOSE OF CONSULTATION:  Follow-up for continued evaluation and management of recurrent testicular large B-cell lymphoma.  HISTORY OF PRESENTING ILLNESS:   Antonio [name] is a wonderful 82 y.o. male who has been referred to [place] by Dr. Bufford Spikes for evaluation and management of Bone lesion of left lower leg. The pt reports that he is doing well overall.   The pt reports that he cannot feel the ovoid finding from his 04/09/18 MRI as noted below. He denies being able to feel anything in his skin as well. He notes that he received a needle injection in his knee prior to this MRI. The pt notes that his knee has lost much cartilage and is considering a knee replacement. He denies any recent injuries to his knee or falls. He has contacted Dr. Ovidio Kin in surgery and another surgeon as well and will be making an appointment.   The pt notes that he does not feel any differently recently as compared to 6 months to a year ago. He does note that he normally has constipation in the spring, and has continued to have mild constipation. He denies any other concerns or symptoms.   The pt previously saw my colleague Dr. Ruthann Cancer for primary testicular B-cell lymphoma in 2000. The pt notes that the involvement was limited to only one testicle. He received 6 intrathecal treatments in his spine, and received 4 cycles of chemotherapy (thinks this was CHOP) and radiation as well. The pt was followed by an oncologist for several years at the [address] of [place] in [place] after moving from [place] after his lymphoma treatment.   The pt notes that he was first diagnosed with melanoma in October 2000. He noticed a strange spot on his left leg that was surgically resected. The pt's personal notes report a 2.9cm thick, level 4,  no LN involvement, high risk stage II, treated with Interferon three times a week for one year. He notes some squamous cell and basal cell involvements as well and sees a dermatologist, Dr. Karlyn Agee, regularly.   Of note prior to the patient's visit today, pt has had MRI Left Knee completed on 04/09/18 with results revealing An ovoid, lobulated, 10mm T2 hyperintense focus within the medial subcutaneous far 1.66mm distal to the joint demonstrates demonstrates nonspecific imaging characteristics.   Most recent lab results (03/01/18) of CBC w/diff is as follows: all values are WNL except for RBC at 4.54, MCV at 100.4, MCH at 33.8.   On review of systems, pt reports left knee pain, mild constipation, and denies new or concerning symptoms, noticing any new lumps or bumps, pain along the spine, abdominal pains, problems passing urine, testicular pain/swelling, and any other symptoms.   INTERVAL HISTORY:   Antonio [name] is an 82 y/o male here for continued evaluation and management of his recurrent testicular large B-cell lymphoma. Patient was last seen by me on 12/09/2022 and reported issues with his CPAP machine. He also complained of rash flare-ups in his right upper extremity, but was otherwise doing well overall with no new medical concerns.   Today, he reports fatigue, which has been stable over the last 6 months. Patient complains of some balance issues. He denies any lumps/bumps, chills, night sweats, abdominal pain, back pain, or leg swelling.   He continues to receive testicular  injections regularly. He denies any other major medication changes or other new significant limitations.   He reports noticing some SOB with an elevation level of 3500 feet. He notes that he recently took a flight to [place]  MEDICAL HISTORY:  Past Medical History:  Diagnosis Date   Age-related macular degeneration, dry, both eyes    B-cell lymphoma [facility]) oncologist--- dr Candise Che   dx 2000 primary right testicular  diffuse large b-cell lymphoma;  s/p  right orchiectomy 2000, completed chemo, intrathcal chemo (spine injection's), and radiation to left testis in 2000   Carotid stenosis, asymptomatic, left ?   ED (erectile dysfunction)    Elevated diaphragm    right   Erectile dysfunction    History of basal cell carcinoma (BCC) excision    multiple excision in office   History of kidney stones    History of seizure    03-03-2020 per pt x1 while taking interferan for melanoma in 2001 ,  none since, told a side effect of interferan   History of squamous cell carcinoma excision    multiple skin excision in office   Hyperlipidemia    Hypertension    followed by pcp  (03-03-2020 per pt had a stress test approx. 2010, told normal)   LBBB (left bundle branch block)    Lymphoma of testis (HCC) 2000   s/p right orchiectomy    Mass of left testis    MGUS (monoclonal gammopathy of unknown significance)    oncologist--- dr Candise Che   Mixed hyperlipidemia 10/26/2020   Personal history of malignant melanoma of skin dermatologist--- dr Karlyn Agee   dx 2001 left calf area  s/p WLE ,  completed one year interferan (03-03-2020 pt states did not involve lymph nodes, no recurrence)   Urgency of urination    Wears hearing aid in both ears     SURGICAL HISTORY: Past Surgical History:  Procedure Laterality Date   CATARACT EXTRACTION W/ INTRAOCULAR LENS IMPLANT Bilateral 2018   IR IMAGING GUIDED PORT INSERTION  03/31/2020   MELANOMA EXCISION  10/1999   ORCHIECTOMY Right 2000   ORCHIECTOMY Left 03/05/2020   Procedure: ORCHIECTOMY;  Surgeon: Bjorn Pippin, MD;  Location: [facility];  Service: Urology;  Laterality: Left;   TONSILLECTOMY  child   TOTAL KNEE ARTHROPLASTY Right 06/2009    SOCIAL HISTORY: Social History   Socioeconomic History   Marital status: Married    Spouse name: Not on file   Number of children: Not on file   Years of education: Not on file   Highest education level: Not on file   Occupational History   Not on file  Tobacco Use   Smoking status: Never   Smokeless tobacco: Never  Vaping Use   Vaping status: Never Used  Substance and Sexual Activity   Alcohol use: Yes    Alcohol/week: 14.0 standard drinks of alcohol    Types: 14 Glasses of wine per week    Comment: 2 wine daily   Drug use: Never   Sexual activity: Not on file  Other Topics Concern   Not on file  Social History Narrative   Social History      Diet?good      Do you drink/eat things with caffeine? seldom      Marital status?          yes  What year were you married? 1962      Do you live in a house, apartment, assisted living, condo, trailer, etc.? house      Is it one or more stories?1      How many persons live in your home? 2      Do you have any pets in your home? (please list) no      Highest level of education completed? college      Current or past profession: president and CEO of [employer]      Advanced Directives      Do you exercise?   yes                                   Type & how often? Garden work, Therapist, music care      Do you have a living will? yes      Do you have a DNR form?       yes                            If not, do you want to discuss one?      Do you have signed POA/HPOA for forms?  yes      Functional Status      Do you have difficulty bathing or dressing yourself? no      Do you have difficulty preparing food or eating? no      Do you have difficulty managing your medications? no      Do you have difficulty managing your finances? no      Do you have difficulty affording your medications? no   Social Determinants of Health   Financial Resource Strain: Low Risk  (07/14/2022)   Overall Financial Resource Strain (CARDIA)    Difficulty of Paying Living Expenses: Not hard at all  Food Insecurity: No Food Insecurity (07/14/2022)   Hunger Vital Sign    Worried About Running Out of Food in the Last Year: Never true    Ran Out of  Food in the Last Year: Never true  Transportation Needs: No Transportation Needs (07/14/2022)   PRAPARE - Administrator, Civil Service (Medical): No    Lack of Transportation (Non-Medical): No  Physical Activity: Insufficiently Active (02/13/2018)   Exercise Vital Sign    Days of Exercise per Week: 2 days    Minutes of Exercise per Session: 20 min  Stress: Stress Concern Present (02/13/2018)   [employer] of Occupational Health - Occupational Stress Questionnaire    Feeling of Stress : To some extent  Social Connections: Socially Integrated (02/13/2018)   Social Connection and Isolation Panel [NHANES]    Frequency of Communication with Friends and Family: More than three times a week    Frequency of Social Gatherings with Friends and Family: More than three times a week    Attends Religious Services: More than 4 times per year    Active Member of [employer] or Organizations: Yes    Attends Banker Meetings: More than 4 times per year    Marital Status: Married  Catering manager Violence: Not At Risk (02/13/2018)   Humiliation, Afraid, Rape, and Kick questionnaire    Fear of Current or Ex-Partner: No    Emotionally Abused: No    Physically Abused: No    Sexually Abused: No    FAMILY HISTORY:  Family History  Problem Relation Age of Onset   Cataracts Mother    Transient ischemic attack Mother    Asthma Mother    Cataracts Father    Diabetes Father        borderline   Hypertension Father    Hyperlipidemia Father    Hyperlipidemia Son    Hypertension Son    Amblyopia Neg Hx    Blindness Neg Hx    Glaucoma Neg Hx    Macular degeneration Neg Hx    Retinal detachment Neg Hx    Strabismus Neg Hx    Retinitis pigmentosa Neg Hx     ALLERGIES:  is allergic to latex.  MEDICATIONS:  Current Outpatient Medications  Medication Sig Dispense Refill   clobetasol (TEMOVATE) 0.05 % external solution Apply 1 Application topically 2 (two) times daily. 50 mL 0    aspirin 81 MG EC tablet Take 1 tablet (81 mg total) by mouth daily. Swallow whole. 30 tablet 12   carboxymethylcellulose (REFRESH PLUS) 0.5 % SOLN Place 1 drop into both eyes daily as needed.     Cholecalciferol 50 MCG (2000 UT) CAPS Take 1 capsule by mouth 2 (two) times daily.     fluticasone (FLONASE) 50 MCG/ACT nasal spray SPRAY 2 SPRAYS INTO EACH NOSTRIL EVERY DAY 48 mL 1   losartan (COZAAR) 100 MG tablet TAKE 1 TABLET BY MOUTH EVERY DAY 90 tablet 1   meloxicam (MOBIC) 7.5 MG tablet TAKE 1 TABLET BY MOUTH EVERY DAY 90 tablet 1   Multiple Vitamins-Minerals (PRESERVISION AREDS 2 PO) Take 1 capsule by mouth 2 (two) times daily.      propranolol (INDERAL) 10 MG tablet TAKE 1 TABLET BY MOUTH THREE TIMES A DAY 270 tablet 1   simvastatin (ZOCOR) 40 MG tablet TAKE 1 TABLET BY MOUTH EVERY DAY 90 tablet 1   Testosterone 20.25 MG/ACT (1.62%) GEL Apply 40.5 mg topically daily.     valACYclovir (VALTREX) 1000 MG tablet Take 1 tablet (1,000 mg total) by mouth 2 (two) times daily. Take 1000 (1 grams) PO 2 times daily for 3 days as needed for flares (herpes) 30 tablet 3   vitamin B-12 (CYANOCOBALAMIN) 1000 MCG tablet Take 1,000 mcg by mouth daily.     No current facility-administered medications for this visit.    REVIEW OF SYSTEMS:    10 Point review of Systems was done is negative except as noted above.   PHYSICAL EXAMINATION: ECOG PERFORMANCE STATUS: 0 - Asymptomatic VS reviewed - stable  Vitals:   06/09/23 1230 06/09/23 1231  BP: (!) 166/95 (!) 154/87  Pulse: 62   Resp: 17   Temp: 97.7 F (36.5 C)   SpO2: 100%      Wt Readings from Last 3 Encounters:  02/21/23 186 lb 3.2 oz (84.5 kg)  12/09/22 189 lb 12.8 oz (86.1 kg)  08/23/22 187 lb (84.8 kg)   Body mass index is 25.2 kg/m.     GENERAL:alert, in no acute distress and comfortable SKIN: no acute rashes, no significant lesions EYES: conjunctiva are pink and non-injected, sclera anicteric OROPHARYNX: MMM, no exudates, no  oropharyngeal erythema or ulceration NECK: supple, no JVD LYMPH:  no palpable lymphadenopathy in the cervical, axillary or inguinal regions LUNGS: clear to auscultation b/l with normal respiratory effort HEART: regular rate & rhythm ABDOMEN:  normoactive bowel sounds , non tender, not distended. Extremity: no pedal edema PSYCH: alert & oriented x 3 with fluent speech NEURO: no focal motor/sensory deficits    LABORATORY DATA:  I have reviewed the data as listed  .    Latest Ref Rng & Units 06/09/2023   11:31 AM 12/09/2022    1:02 PM 06/06/2022    9:34 AM  CBC  WBC 4.0 - 10.5 K/uL 6.4  7.4  6.5   Hemoglobin 13.0 - 17.0 g/dL 16.1  09.6  04.5   Hematocrit 39.0 - 52.0 % 48.3  44.6  46.6   Platelets 150 - 400 K/uL 193  210  203    . CBC    Component Value Date/Time   WBC 6.4 06/09/2023 1131   WBC 6.0 08/19/2021 0942   RBC 4.63 06/09/2023 1131   HGB 16.3 06/09/2023 1131   HCT 48.3 06/09/2023 1131   HCT 40.8 12/19/2019 1057   PLT 193 06/09/2023 1131   MCV 104.3 (H) 06/09/2023 1131   MCH 35.2 (H) 06/09/2023 1131   MCHC 33.7 06/09/2023 1131   RDW 13.2 06/09/2023 1131   LYMPHSABS 1.5 06/09/2023 1131   MONOABS 0.8 06/09/2023 1131   EOSABS 0.1 06/09/2023 1131   BASOSABS 0.1 06/09/2023 1131    .    Latest Ref Rng & Units 06/09/2023   11:31 AM 12/09/2022    1:02 PM 06/06/2022    9:34 AM  CMP  Glucose 70 - 99 mg/dL 82  409  76   BUN 8 - 23 mg/dL 14  18  16    Creatinine 0.61 - 1.24 mg/dL 8.11  9.14  7.82   Sodium 135 - 145 mmol/L 138  139  140   Potassium 3.5 - 5.1 mmol/L 4.2  4.5  4.7   Chloride 98 - 111 mmol/L 102  102  104   CO2 22 - 32 mmol/L 31  32  32   Calcium 8.9 - 10.3 mg/dL 9.2  8.8  9.8   Total Protein 6.5 - 8.1 g/dL 7.2  6.8  6.7   Total Bilirubin 0.3 - 1.2 mg/dL 1.3  1.0  1.0   Alkaline Phos 38 - 126 U/L 56  51  54   AST 15 - 41 U/L 23  19  21    ALT 0 - 44 U/L 24  24  24     . Lab Results  Component Value Date   LDH 121 12/09/2022    03/05/2020 Left  Testicular Flow Pathology Report [id number]):    03/05/2020 FISH Panel:    03/05/2020 Left Testicular Mass Surgical Pathology ([id number]):    03/01/18 CBC w/diff:   RADIOGRAPHIC STUDIES: I have personally reviewed the radiological images as listed and agreed with the findings in the report.  03/05/20 of Surgical Pathology ([id number]) SURGICAL PATHOLOGY  CASE: [id number]  PATIENT: Antonio [name]  Surgical Pathology Report  Clinical History: Left testicular mass (jmc)  FINAL MICROSCOPIC DIAGNOSIS:  A. TESTICLE AND CORD, LEFT, ORCHIECTOMY:  - Diffuse large B-cell lymphoma  -See comment  COMMENT:  The sections of the testis and spermatic cord nodules show effacement of  the architecture by an atypical lymphoid infiltrate characterized by  predominance of medium and large lymphoid cells with vesicular chromatin  and small nucleoli associated with brisk mitosis.  The appearance is  primarily diffuse with lack of atypical follicles.  Variable number of  admixed smaller lymphocytes are seen. To further evaluate this process,  flow cytometric analysis was performed ([id number]) and shows a  monoclonal kappa-restricted B-cell population.  In addition, a battery  of immunohistochemical stains was performed and shows that the atypical  lymphoid cells are positive for CD20, CD79a,  PAX 5, CD10 (weak), BCL-2,  MUM-1 and cytoplasmic kappa.  No significant staining is seen with  CD138, cyclin D1, CD30, CD34, TdT, EBV in situ hybridization or  cytoplasmic lambda.  Ki67 shows variable expression ranging from 10% to  >50%.  There is an admixed T-cell population to a lesser extent as seen  with CD3 and CD5 and there is no apparent co-expression of CD5 in B-cell  areas.  The overall features are consistent with involvement by diffuse  large B-cell lymphoma, GCB type.  The results were discussed with Dr.  Annabell Howells and Candise Che on 03/11/2020.    ASSESSMENT & PLAN:   82 y.o. male  with  H/o Rt Primary testicular large B cell lymphoma in 2000 treatment with R-CHOP x 6 cycles as part of clinical trial + IT MTX x 4 cycles and RT to left testicle  2 . H/o Relapsed Left Primary testicular large B cell lymphoma 2021 stage I/IIE. Appears to be a new event since it is occurring 20 yrs after his previous rt testicular lymphoma S/p 6 cycles of R-CEOP -- currently in remission.  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  PLAN:  -Discussed lab results on 06/09/2023 in detail with patient. CBC normal, showed WBC of 6.4K, hemoglobin of 16.3, and platelets of 193K. -CMP normal -patient has been in remission for 3 years -Patient has no lab or clinical evidence of recurrence of his primary testicular large B-cell lymphoma at this time. -continue B12 1000 mcg p.o. daily  -continue two tablets, 4000 units of Vitamin D daily -will continue to monitor with labs in 6 months -continue to follow with pulmonology regularly for optimal evaluation and management -continue to follow regularly with dermatology  FOLLOW-UP: RTC with Dr Candise Che with labs in 6 months  The total time spent in the appointment was 20 minutes* .  All of the patient's questions were answered with apparent satisfaction. The patient knows to call the clinic with any problems, questions or concerns.   Wyvonnia Lora MD MS AAHIVMS [facility] Hematology/Oncology Physician [facility]  .*Total Encounter Time as defined by the Centers for Medicare and Medicaid Services includes, in addition to the face-to-face time of a patient visit (documented in the note above) non-face-to-face time: obtaining and reviewing outside history, ordering and reviewing medications, tests or procedures, care  coordination (communications with other health care professionals or caregivers) and documentation in the medical record.    I,Mitra Faeizi,acting as a Neurosurgeon for Wyvonnia Lora, MD.,have documented all relevant documentation on the behalf of Wyvonnia Lora, MD,as directed by  Wyvonnia Lora, MD while in the presence of Wyvonnia Lora, MD.  .I have reviewed the above documentation for accuracy and completeness, and I agree with the above. Johney Maine MD

## 2023-06-14 ENCOUNTER — Telehealth: Payer: Self-pay | Admitting: Hematology

## 2023-06-14 NOTE — Telephone Encounter (Signed)
 Left patient a message in regards to scheduled appointment times/dates

## 2023-06-15 LAB — MULTIPLE MYELOMA PANEL, SERUM
Albumin SerPl Elph-Mcnc: 4 g/dL (ref 2.9–4.4)
Albumin/Glob SerPl: 1.7 (ref 0.7–1.7)
Alpha 1: 0.2 g/dL (ref 0.0–0.4)
Alpha2 Glob SerPl Elph-Mcnc: 0.6 g/dL (ref 0.4–1.0)
B-Globulin SerPl Elph-Mcnc: 0.8 g/dL (ref 0.7–1.3)
Gamma Glob SerPl Elph-Mcnc: 0.8 g/dL (ref 0.4–1.8)
Globulin, Total: 2.4 g/dL (ref 2.2–3.9)
IgA: 86 mg/dL (ref 61–437)
IgG (Immunoglobin G), Serum: 615 mg/dL (ref 603–1613)
IgM (Immunoglobulin M), Srm: 407 mg/dL — ABNORMAL HIGH (ref 15–143)
M Protein SerPl Elph-Mcnc: 0.3 g/dL — ABNORMAL HIGH
Total Protein ELP: 6.4 g/dL (ref 6.0–8.5)

## 2023-06-16 ENCOUNTER — Ambulatory Visit: Payer: PPO | Admitting: Hematology

## 2023-06-16 ENCOUNTER — Other Ambulatory Visit: Payer: PPO

## 2023-07-08 DIAGNOSIS — U071 COVID-19: Secondary | ICD-10-CM | POA: Diagnosis not present

## 2023-07-08 DIAGNOSIS — G4733 Obstructive sleep apnea (adult) (pediatric): Secondary | ICD-10-CM | POA: Diagnosis not present

## 2023-07-18 ENCOUNTER — Other Ambulatory Visit: Payer: Self-pay | Admitting: Internal Medicine

## 2023-07-19 ENCOUNTER — Encounter: Payer: Self-pay | Admitting: Pulmonary Disease

## 2023-07-19 ENCOUNTER — Ambulatory Visit: Payer: PPO | Admitting: Pulmonary Disease

## 2023-07-19 VITALS — BP 149/85 | HR 76 | Temp 97.6°F | Ht 72.0 in | Wt 186.4 lb

## 2023-07-19 DIAGNOSIS — U099 Post covid-19 condition, unspecified: Secondary | ICD-10-CM | POA: Diagnosis not present

## 2023-07-19 DIAGNOSIS — G4733 Obstructive sleep apnea (adult) (pediatric): Secondary | ICD-10-CM | POA: Diagnosis not present

## 2023-07-19 DIAGNOSIS — R0609 Other forms of dyspnea: Secondary | ICD-10-CM

## 2023-07-19 NOTE — Progress Notes (Signed)
Antonio [name]    [id number]    [birth year]  Primary Care Physician:Gupta, Freddie Breech, MD  Referring Physician: Mahlon Gammon, MD [address] [place],  [place] [address]   Chief complaint:  Follow up post Covid syndrome, hospitalization for pneumonia  HPI: 82 y.o.  with history of hypertension, recurrent testicular large B-cell lymphoma Hospitalized for COVID-19 in January 2022.  He was immunized and boosted against COVID Treated with remdesivir, prednisone.  Hospital course complicated by SVT, diastolic heart failure.  He went to rehab till mid February.  Weaned off oxygen before being sent home. He was on a Pred taper till mid February.  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  Reports slightly increased dyspnea with congestion over the past week with O2 sats dropping to the mid 80s.  He also has post Covid cognitive issues and forgetfulness. Denies any fevers, chills.  He has history of large B-cell lymphoma in 2009 recurrence in 2021.  He received chemotherapy until late 2021 and hence considered immunocompromised  Hospitalized in August 2022 for multifocal pneumonia.  He required BiPAP initially.  Treated with IV antibiotics, bronchodilators.  CT chest on admission was negative for PE with bilateral infiltrates suspicious for multifocal pneumonia.  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  No significant recent travel Relevant family history: No family history of lung disease  Interim history: Discussed the use of AI scribe software for clinical note transcription with the patient, who gave verbal  consent to proceed.  The patient, with a history of COVID-19 and resultant lung scarring, presents for a routine follow-up. He reports that his breathing is 'okay' but notes that he experiences difficulty at altitudes above three thousand feet. He has adapted to this by carrying oxygen, which he also uses during flights. He continues to use his CPAP machine regularly, with the exception of short trips where carrying the machine is inconvenient. He has not noticed any major changes in his health since his last visit.      Outpatient Encounter Medications as of 07/19/2023  Medication Sig   aspirin 81 MG EC tablet Take 1 tablet (81 mg total) by mouth daily. Swallow whole.   carboxymethylcellulose (REFRESH PLUS) 0.5 % SOLN Place 1 drop into both eyes daily as needed.   fluticasone (FLONASE) 50 MCG/ACT nasal spray SPRAY 2 SPRAYS INTO EACH NOSTRIL EVERY DAY   losartan (COZAAR) 100 MG tablet TAKE 1 TABLET BY MOUTH EVERY DAY   meloxicam (MOBIC) 7.5 MG tablet TAKE 1 TABLET BY MOUTH EVERY DAY   Multiple Vitamins-Minerals (PRESERVISION AREDS 2 PO) Take 1 capsule by mouth 2 (two) times daily.    propranolol (INDERAL) 10 MG tablet TAKE 1 TABLET BY MOUTH THREE TIMES A DAY   simvastatin (ZOCOR) 40 MG tablet TAKE 1 TABLET BY MOUTH EVERY DAY   Testosterone 20.25 MG/ACT (1.62%) GEL Apply 40.5 mg topically daily.   valACYclovir (VALTREX) 1000 MG tablet Take 1 tablet (1,000 mg total) by mouth 2 (two) times daily. Take 1000 (1 grams) PO 2 times daily for 3 days as needed for flares (herpes)   vitamin B-12 (CYANOCOBALAMIN) 1000 MCG tablet Take  1,000 mcg by mouth daily.   [DISCONTINUED] Cholecalciferol 50 MCG (2000 UT) CAPS Take 1 capsule by mouth 2 (two) times daily.   [DISCONTINUED] clobetasol (TEMOVATE) 0.05 % external solution Apply 1 Application topically 2 (two) times daily.   No facility-administered encounter medications on file as of 07/19/2023.    Allergies as of 07/19/2023 - Review Complete 07/19/2023   Allergen Reaction Noted   Latex Itching 03/12/2020   Physical Exam: Blood pressure (!) 142/78, pulse 95, temperature 97.6 F (36.4 C), temperature source Oral, height 6' (1.829 m), weight 187 lb (84.8 kg), SpO2 97 %. Gen:      No acute distress HEENT:  EOMI, sclera anicteric Neck:     No masses; no thyromegaly Lungs:    Clear to auscultation bilaterally; normal respiratory effort CV:         Regular rate and rhythm; no murmurs Abd:      + bowel sounds; soft, non-tender; no palpable masses, no distension Ext:    No edema; adequate peripheral perfusion Skin:      Warm and dry; no rash Neuro: alert and oriented x 3 Psych: normal mood and affect   Data Reviewed: Imaging: CT chest 10/20/2020-multifocal infiltrates consistent with pneumonia CTA 10/26/2020-no PE, multifocal infiltrates.   High-resolution CT 01/13/2021- bilateral groundglass opacities, reticulation with mild traction bronchiectasis consistent with post-COVID ILD CT angiogram 06/15/2021-no PE, multifocal infiltrates CT-history from 12/14/2021-stable mild scarring consistent with post-COVID ILD I have reviewed the images personally.  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 Patient is managing symptoms with supplemental oxygen and CPAP. -Order CT scan in six months to monitor progression of scarring. -Continue current management with supplemental oxygen and CPAP.  Obstructive Sleep Apnea Patient is compliant with CPAP use, with data showing 100% usage. -Continue CPAP use as tolerated.  General Health Maintenance -Continue to monitor respiratory symptoms and report any changes.    Plan/Recommendations: Continue CPAP HRCT in 6 months Portable concentrator for travel.  Chilton Greathouse MD [facility] 07/19/2023, 10:09 AM  CC:  Mahlon Gammon, MD

## 2023-07-19 NOTE — Telephone Encounter (Signed)
Patient has request refill on medication that's not on medication list. Medication pend and sent to PCP Mahlon Gammon, MD for approval.

## 2023-07-19 NOTE — Patient Instructions (Signed)
VISIT SUMMARY:  During your recent visit, we discussed your history of COVID-19 and the resulting lung scarring, as well as your Obstructive Sleep Apnea. You mentioned that your breathing is generally okay, but you experience difficulty at high altitudes. You have been managing this by using supplemental oxygen and your CPAP machine.  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 We will continue to monitor the progression of this scarring with a CT scan in six months. Please continue using your supplemental oxygen and CPAP machine as you have been doing.  -OBSTRUCTIVE SLEEP APNEA: This is a sleep disorder where your breathing repeatedly stops and starts. You have been managing this well with your CPAP machine. Please continue to use it as you have been doing.  INSTRUCTIONS:  Please continue to monitor your respiratory symptoms and report any changes. We will order a CT scan in six months to monitor the progression of your lung scarring. Continue using your supplemental oxygen and CPAP machine as you have been doing.

## 2023-08-16 DIAGNOSIS — E291 Testicular hypofunction: Secondary | ICD-10-CM | POA: Diagnosis not present

## 2023-08-21 ENCOUNTER — Other Ambulatory Visit: Payer: Self-pay | Admitting: Internal Medicine

## 2023-08-22 DIAGNOSIS — E785 Hyperlipidemia, unspecified: Secondary | ICD-10-CM | POA: Diagnosis not present

## 2023-08-22 LAB — TSH: TSH: 2.53 (ref 0.41–5.90)

## 2023-08-22 LAB — LIPID PANEL
Cholesterol: 135 (ref 0–200)
HDL: 72 — AB (ref 35–70)
LDL Cholesterol: 49
Triglycerides: 68 (ref 40–160)

## 2023-08-23 DIAGNOSIS — E291 Testicular hypofunction: Secondary | ICD-10-CM | POA: Diagnosis not present

## 2023-08-29 ENCOUNTER — Non-Acute Institutional Stay: Payer: PPO | Admitting: Internal Medicine

## 2023-08-29 ENCOUNTER — Encounter: Payer: Self-pay | Admitting: Internal Medicine

## 2023-08-29 VITALS — BP 152/98 | HR 65 | Temp 98.2°F | Resp 18 | Ht 72.0 in | Wt 182.6 lb

## 2023-08-29 DIAGNOSIS — I10 Essential (primary) hypertension: Secondary | ICD-10-CM

## 2023-08-29 DIAGNOSIS — Z8572 Personal history of non-Hodgkin lymphomas: Secondary | ICD-10-CM | POA: Diagnosis not present

## 2023-08-29 DIAGNOSIS — E7800 Pure hypercholesterolemia, unspecified: Secondary | ICD-10-CM

## 2023-08-29 DIAGNOSIS — G250 Essential tremor: Secondary | ICD-10-CM | POA: Diagnosis not present

## 2023-08-29 DIAGNOSIS — R4189 Other symptoms and signs involving cognitive functions and awareness: Secondary | ICD-10-CM

## 2023-08-29 DIAGNOSIS — E349 Endocrine disorder, unspecified: Secondary | ICD-10-CM

## 2023-08-29 DIAGNOSIS — L299 Pruritus, unspecified: Secondary | ICD-10-CM | POA: Diagnosis not present

## 2023-08-29 MED ORDER — TRIAMCINOLONE ACETONIDE 0.1 % EX CREA: 1.0000 | TOPICAL_CREAM | Freq: Two times a day (BID) | CUTANEOUS | 0 refills | Status: DC

## 2023-08-29 NOTE — Patient Instructions (Signed)
Senna Plus at night as needed Prune Juice in the morning

## 2023-08-29 NOTE — Progress Notes (Signed)
Location:  [facility] features editor of Service:  Clinic (12)  Provider:   Code Status:  Goals of Care:     08/29/2023    3:41 PM  Advanced Directives  Does Patient Have a Medical Advance Directive? Yes  Type of Estate agent of [place];Living will;Out of facility DNR (pink MOST or yellow form)  Does patient want to make changes to medical advance directive? No - Patient declined  Copy of Healthcare Power of Attorney in Chart? No - copy requested  Would patient like information on creating a medical advance directive? No - Patient declined     Chief Complaint  Patient presents with   Medical Management of Chronic Issues    Patient is being seen for a 6 month follow up    HPI: Patient is a 82 y.o. [name] seen today for medical management of chronic diseases.   [place] in [place]   Patient has a history of COVID-pneumonia in January 2022. 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   ED (erectile dysfunction)    Elevated diaphragm    right    Erectile dysfunction    History of basal cell carcinoma (BCC) excision    multiple excision in office   History of kidney stones    History of seizure    03-03-2020 per pt x1 while taking interferan for melanoma in 2001 ,  none since, told a side effect of interferan   History of squamous cell carcinoma excision    multiple skin excision in office   Hyperlipidemia    Hypertension    followed by pcp  (03-03-2020 per pt had a stress test approx. 2010, told normal)   LBBB (left bundle branch block)    Lymphoma of testis (HCC) 2000   s/p right orchiectomy    Mass of left testis    MGUS (monoclonal gammopathy of unknown significance)    oncologist--- dr Candise Che   Mixed hyperlipidemia 10/26/2020   Personal history of malignant melanoma of skin dermatologist--- dr Karlyn Agee   dx 2001 left calf area  s/p WLE ,  completed one year interferan (03-03-2020 pt states did not involve lymph nodes, no recurrence)   Urgency of urination    Wears hearing aid in both ears     Past Surgical History:  Procedure Laterality Date   CATARACT EXTRACTION W/ INTRAOCULAR LENS IMPLANT Bilateral 2018   IR IMAGING GUIDED PORT INSERTION  03/31/2020   MELANOMA EXCISION  10/1999   ORCHIECTOMY Right 2000   ORCHIECTOMY  Left 03/05/2020   Procedure: ORCHIECTOMY;  Surgeon: Bjorn Pippin, MD;  Location: [facility];  Service: Urology;  Laterality: Left;   TONSILLECTOMY  child   TOTAL KNEE ARTHROPLASTY Right 06/2009    Allergies  Allergen Reactions   Latex Itching    Latex tape causes itching and redness.    Outpatient Encounter Medications as of 08/29/2023  Medication Sig   ABRYSVO 120 MCG/0.5ML injection Inject 0.5 mLs into the muscle once.   aspirin 81 MG EC tablet Take 1 tablet (81 mg total) by mouth daily. Swallow whole.   carboxymethylcellulose (REFRESH PLUS) 0.5 % SOLN Place 1 drop into both eyes daily as needed.   Cholecalciferol (D-3-5) 125 MCG (5000 UT) capsule Take 5,000 Units by mouth daily.    clobetasol (TEMOVATE) 0.05 % external solution APPLY TO AFFECTED AREA TWICE A DAY   FLUAD 0.5 ML injection Inject 0.5 mLs into the muscle once.   fluticasone (FLONASE) 50 MCG/ACT nasal spray SPRAY 2 SPRAYS INTO EACH NOSTRIL EVERY DAY   losartan (COZAAR) 100 MG tablet TAKE 1 TABLET BY MOUTH EVERY DAY   meloxicam (MOBIC) 7.5 MG tablet TAKE 1 TABLET BY MOUTH EVERY DAY   Multiple Vitamins-Minerals (PRESERVISION AREDS 2 PO) Take 1 capsule by mouth 2 (two) times daily.    propranolol (INDERAL) 10 MG tablet TAKE 1 TABLET BY MOUTH THREE TIMES A DAY   simvastatin (ZOCOR) 40 MG tablet TAKE 1 TABLET BY MOUTH EVERY DAY   SPIKEVAX syringe Inject 0.5 mLs into the muscle once.   Testosterone 20.25 MG/ACT (1.62%) GEL Apply 40.5 mg topically daily.   valACYclovir (VALTREX) 1000 MG tablet Take 1 tablet (1,000 mg total) by mouth 2 (two) times daily. Take 1000 (1 grams) PO 2 times daily for 3 days as needed for flares (herpes)   vitamin B-12 (CYANOCOBALAMIN) 1000 MCG tablet Take 1,000 mcg by mouth daily.   No facility-administered encounter medications on file as of 08/29/2023.    Review of Systems:  Review of Systems  Constitutional:  Negative for activity change, appetite change and unexpected weight change.  HENT: Negative.    Respiratory:  Positive for shortness of breath. Negative for cough.   Cardiovascular:  Negative for leg swelling.  Gastrointestinal:  Positive for constipation.  Genitourinary:  Negative for frequency.  Musculoskeletal:  Negative for arthralgias, gait problem and myalgias.  Skin:  Positive for rash.  Neurological:  Negative for dizziness and weakness.  Psychiatric/Behavioral:  Positive for confusion. Negative for sleep disturbance.   All other systems reviewed and are negative.   Health Maintenance  Topic Date Due   COVID-19 Vaccine (8 - 2023-24 season) 09/14/2023 (Originally 08/25/2023)   Medicare Annual Wellness (AWV)  11/29/2023 (Originally 08/13/2021)   DTaP/Tdap/Td (2  - Td or Tdap) 03/04/2024   Pneumonia Vaccine 53+ Years old  Completed   INFLUENZA VACCINE  Completed   Zoster Vaccines- Shingrix  Completed   HPV VACCINES  Aged Out    Physical Exam: Vitals:   08/29/23 1540  BP: (!) 152/98  Pulse: 65  Resp: 18  Temp: 98.2 F (36.8 C)  TempSrc: Temporal  SpO2: 97%  Weight: 182 lb 9.6 oz (82.8 kg)  Height: 6' (1.829 m)   Body mass index is 24.77 kg/m. Physical Exam Vitals reviewed.  Constitutional:      Appearance: Normal appearance.  HENT:     Head: Normocephalic.     Nose: Nose normal.     Mouth/Throat:     Mouth: Mucous membranes are moist.  Pharynx: Oropharynx is clear.  Eyes:     Pupils: Pupils are equal, round, and reactive to light.  Cardiovascular:     Rate and Rhythm: Normal rate and regular rhythm.     Pulses: Normal pulses.     Heart sounds: No murmur heard. Pulmonary:     Effort: Pulmonary effort is normal. No respiratory distress.     Breath sounds: Normal breath sounds. No rales.     Comments: Few rales present in the base of the lungs Abdominal:     General: Abdomen is flat. Bowel sounds are normal.     Palpations: Abdomen is soft.  Musculoskeletal:        General: No swelling.     Cervical back: Neck supple.  Skin:    General: Skin is warm.     Comments: Dermatitis in his Arms and Neck area  Neurological:     General: No focal deficit present.     Mental Status: He is alert and oriented to person, place, and time.  Psychiatric:        Mood and Affect: Mood normal.        Thought Content: Thought content normal.       08/29/2023    4:34 PM 02/13/2018    9:40 AM  MMSE - Mini Mental State Exam  Orientation to time 5 3  Orientation to Place 5 5  Registration 3 3  Attention/ Calculation 5 5  Recall 2 2  Language- name 2 objects 2 2  Language- repeat 1 1  Language- follow 3 step command 3 3  Language- read & follow direction 1 1  Write a sentence 1 1  Copy design 1 1  Total score 29 27     Labs  reviewed: Basic Metabolic Panel: Recent Labs    12/09/22 1302 06/09/23 1131 08/22/23 0000  NA 139 138  --   K 4.5 4.2  --   CL 102 102  --   CO2 32 31  --   GLUCOSE 132* 82  --   BUN 18 14  --   CREATININE 0.86 0.84  --   CALCIUM 8.8* 9.2  --   TSH  --   --  2.53   Liver Function Tests: Recent Labs    12/09/22 1302 06/09/23 1131  AST 19 23  ALT 24 24  ALKPHOS 51 56  BILITOT 1.0 1.3*  PROT 6.8 7.2  ALBUMIN 4.4 4.6   No results for input(s): "LIPASE", "AMYLASE" in the last 8760 hours. No results for input(s): "AMMONIA" in the last 8760 hours. CBC: Recent Labs    12/09/22 1302 06/09/23 1131  WBC 7.4 6.4  NEUTROABS 5.0 3.9  HGB 15.1 16.3  HCT 44.6 48.3  MCV 102.8* 104.3*  PLT 210 193   Lipid Panel: Recent Labs    08/22/23 0000  CHOL 135  HDL 72*  LDLCALC [place]  TRIG 68   Lab Results  Component Value Date   HGBA1C 5.7 02/27/2020    Procedures since last visit: No results found.  Assessment/Plan 1. Itchy skin Seems Atopic Dermatitis Try Triamcinolone Has appointment with Dermatologist   2. Essential hypertension Continue same dose of Cozaar  3. History of B-cell lymphoma Follows with   4. Pure hypercholesterolemia Statin  5. Benign essential tremor Propanolol PRn  6 Constipation Senna Plus 7. Testosterone deficiency   8. 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

## 2023-09-02 DIAGNOSIS — G250 Essential tremor: Secondary | ICD-10-CM | POA: Insufficient documentation

## 2023-09-04 ENCOUNTER — Other Ambulatory Visit: Payer: Self-pay | Admitting: Internal Medicine

## 2023-09-25 DIAGNOSIS — C4442 Squamous cell carcinoma of skin of scalp and neck: Secondary | ICD-10-CM | POA: Diagnosis not present

## 2023-09-25 DIAGNOSIS — L821 Other seborrheic keratosis: Secondary | ICD-10-CM | POA: Diagnosis not present

## 2023-09-25 DIAGNOSIS — D485 Neoplasm of uncertain behavior of skin: Secondary | ICD-10-CM | POA: Diagnosis not present

## 2023-09-25 DIAGNOSIS — Z85828 Personal history of other malignant neoplasm of skin: Secondary | ICD-10-CM | POA: Diagnosis not present

## 2023-09-25 DIAGNOSIS — L570 Actinic keratosis: Secondary | ICD-10-CM | POA: Diagnosis not present

## 2023-09-25 DIAGNOSIS — Z85820 Personal history of malignant melanoma of skin: Secondary | ICD-10-CM | POA: Diagnosis not present

## 2023-10-02 DIAGNOSIS — I700 Atherosclerosis of aorta: Secondary | ICD-10-CM | POA: Diagnosis not present

## 2023-10-02 DIAGNOSIS — E291 Testicular hypofunction: Secondary | ICD-10-CM | POA: Diagnosis not present

## 2023-10-02 DIAGNOSIS — J9611 Chronic respiratory failure with hypoxia: Secondary | ICD-10-CM | POA: Diagnosis not present

## 2023-10-02 DIAGNOSIS — H9193 Unspecified hearing loss, bilateral: Secondary | ICD-10-CM | POA: Diagnosis not present

## 2023-10-02 DIAGNOSIS — C8519 Unspecified B-cell lymphoma, extranodal and solid organ sites: Secondary | ICD-10-CM | POA: Diagnosis not present

## 2023-10-02 DIAGNOSIS — G250 Essential tremor: Secondary | ICD-10-CM | POA: Diagnosis not present

## 2023-10-02 DIAGNOSIS — I10 Essential (primary) hypertension: Secondary | ICD-10-CM | POA: Diagnosis not present

## 2023-10-02 DIAGNOSIS — B009 Herpesviral infection, unspecified: Secondary | ICD-10-CM | POA: Diagnosis not present

## 2023-10-02 DIAGNOSIS — E785 Hyperlipidemia, unspecified: Secondary | ICD-10-CM | POA: Diagnosis not present

## 2023-10-02 DIAGNOSIS — G8929 Other chronic pain: Secondary | ICD-10-CM | POA: Diagnosis not present

## 2023-10-02 DIAGNOSIS — H3530 Unspecified macular degeneration: Secondary | ICD-10-CM | POA: Diagnosis not present

## 2023-10-02 DIAGNOSIS — E559 Vitamin D deficiency, unspecified: Secondary | ICD-10-CM | POA: Diagnosis not present

## 2023-10-04 ENCOUNTER — Telehealth: Payer: Self-pay | Admitting: Pulmonary Disease

## 2023-10-04 DIAGNOSIS — U099 Post covid-19 condition, unspecified: Secondary | ICD-10-CM

## 2023-10-04 NOTE — Telephone Encounter (Signed)
Patient would like for his CT Scan to be scheduled for February.

## 2023-10-05 NOTE — Telephone Encounter (Signed)
We have this ct down for 01/2024 but patient is wanting to do it in 11/2023

## 2023-10-12 NOTE — Telephone Encounter (Signed)
Ok to do it end of Jan or early feb 2025. New order has been placed. Please inform the patient

## 2023-10-13 ENCOUNTER — Other Ambulatory Visit: Payer: Self-pay | Admitting: Internal Medicine

## 2023-10-19 NOTE — Telephone Encounter (Signed)
 Looks like a new order was placed

## 2023-11-02 ENCOUNTER — Other Ambulatory Visit: Payer: Self-pay | Admitting: Internal Medicine

## 2023-11-10 ENCOUNTER — Ambulatory Visit
Admission: RE | Admit: 2023-11-10 | Discharge: 2023-11-10 | Disposition: A | Discharge status: Home / Self Care | Payer: PPO | Source: Ambulatory Visit | Attending: Pulmonary Disease

## 2023-11-10 DIAGNOSIS — R0602 Shortness of breath: Secondary | ICD-10-CM | POA: Diagnosis not present

## 2023-11-10 DIAGNOSIS — I700 Atherosclerosis of aorta: Secondary | ICD-10-CM | POA: Diagnosis not present

## 2023-11-10 DIAGNOSIS — I2510 Atherosclerotic heart disease of native coronary artery without angina pectoris: Secondary | ICD-10-CM | POA: Diagnosis not present

## 2023-11-10 DIAGNOSIS — R0609 Other forms of dyspnea: Secondary | ICD-10-CM

## 2023-11-10 DIAGNOSIS — J8410 Pulmonary fibrosis, unspecified: Secondary | ICD-10-CM | POA: Diagnosis not present

## 2023-11-16 ENCOUNTER — Telehealth: Payer: Self-pay | Admitting: Pulmonary Disease

## 2023-11-16 NOTE — Telephone Encounter (Signed)
Patient had a chest scan completed about a week ago and would like to know the results. It might have been an MRI. Call back number [phone]

## 2023-11-17 NOTE — Telephone Encounter (Signed)
 Lm x1 for patient.

## 2023-11-20 NOTE — Telephone Encounter (Signed)
I called the patient's mobile number several times but there was no answer.  I left a voice message that his CT is unchanged with regard to the lungs which shows stable pattern of scarring from COVID.  Will continue to monitor this Nothing further needed.

## 2023-12-04 ENCOUNTER — Encounter: Payer: Self-pay | Admitting: Pulmonary Disease

## 2023-12-14 ENCOUNTER — Other Ambulatory Visit: Payer: Self-pay

## 2023-12-14 DIAGNOSIS — C83398 Diffuse large b-cell lymphoma of other extranodal and solid organ sites: Secondary | ICD-10-CM

## 2023-12-14 NOTE — Progress Notes (Signed)
 HEMATOLOGY/ONCOLOGY CLINIC NOTE  Date of Service: 12/15/2023    Patient Care Team: Mahlon Gammon, MD as PCP - General (Internal Medicine)  CHIEF COMPLAINTS/PURPOSE OF CONSULTATION:  Follow-up for continued evaluation and management of recurrent testicular large B-cell lymphoma.  HISTORY OF PRESENTING ILLNESS:   Antonio [name] is a wonderful 83 y.o. male who has been referred to [place] by Dr. Bufford Spikes for evaluation and management of Bone lesion of left lower leg. The pt reports that he is doing well overall.   The pt reports that he cannot feel the ovoid finding from his 04/09/18 MRI as noted below. He denies being able to feel anything in his skin as well. He notes that he received a needle injection in his knee prior to this MRI. The pt notes that his knee has lost much cartilage and is considering a knee replacement. He denies any recent injuries to his knee or falls. He has contacted Dr. Ovidio Kin in surgery and another surgeon as well and will be making an appointment.   The pt notes that he does not feel any differently recently as compared to 6 months to a year ago. He does note that he normally has constipation in the spring, and has continued to have mild constipation. He denies any other concerns or symptoms.   The pt previously saw my colleague Dr. Ruthann Cancer for primary testicular B-cell lymphoma in 2000. The pt notes that the involvement was limited to only one testicle. He received 6 intrathecal treatments in his spine, and received 4 cycles of chemotherapy (thinks this was CHOP) and radiation as well. The pt was followed by an oncologist for several years at the [address] of [place] in [place] after moving from [place] after his lymphoma treatment.   The pt notes that he was first diagnosed with melanoma in October 2000. He noticed a strange spot on his left leg that was surgically resected. The pt's personal notes report a 2.9cm thick, level 4,  no LN involvement, high risk stage II, treated with Interferon three times a week for one year. He notes some squamous cell and basal cell involvements as well and sees a dermatologist, Dr. Karlyn Agee, regularly.   Of note prior to the patient's visit today, pt has had MRI Left Knee completed on 04/09/18 with results revealing An ovoid, lobulated, 10mm T2 hyperintense focus within the medial subcutaneous far 1.7mm distal to the joint demonstrates demonstrates nonspecific imaging characteristics.   Most recent lab results (03/01/18) of CBC w/diff is as follows: all values are WNL except for RBC at 4.54, MCV at 100.4, MCH at 33.8.   On review of systems, pt reports left knee pain, mild constipation, and denies new or concerning symptoms, noticing any new lumps or bumps, pain along the spine, abdominal pains, problems passing urine, testicular pain/swelling, and any other symptoms.   INTERVAL HISTORY:   Antonio [name] is an 82 y/o male here for continued evaluation and management of his recurrent testicular large B-cell lymphoma. Patient was last seen by me on 06/09/2023 and complained of stable fatigue, balance issues, and some SOB with elevation level of 3500 feet.  Today, he reports that he has been doing well since his last clinical visit. Patient reports an episode last night after a 5-hr car ride from [facility], in which he began endorsing significant night sweats while sitting. His symptoms were not persistent and he partly attributes this to dehydration.   He also  reports mild issues with waking up frequently during the night due to polyuria.   Patient reports bilateral "kink" in his neck.  His breathing habits have been stable and he denies any improvement. Patient does not use at-home oxygen, but does have this available.   He denies any new lumps and follows up with his dermatologist frequently.   Patient has otherwise been doing well overall with no persistent or significant symptoms.     Patient does take 400/500 MG magnesium for constipation which works well.   He has no other major medication changes. He is UTD with his age-appropriate vaccines including COVID-19, RSV, flu, and pneumonia.  He continues to use steroid cream.   His blood pressure in clinic today was noted to be 152/103.  His testosterone replacement dose has been stable and is not causing polycythemia.   MEDICAL HISTORY:  Past Medical History:  Diagnosis Date   Age-related macular degeneration, dry, both eyes    B-cell lymphoma [facility]) oncologist--- dr Candise Che   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   ED (erectile dysfunction)    Elevated diaphragm    right   Erectile dysfunction    History of basal cell carcinoma (BCC) excision    multiple excision in office   History of kidney stones    History of seizure    03-03-2020 per pt x1 while taking interferan for melanoma in 2001 ,  none since, told a side effect of interferan   History of squamous cell carcinoma excision    multiple skin excision in office   Hyperlipidemia    Hypertension    followed by pcp  (03-03-2020 per pt had a stress test approx. 2010, told normal)   LBBB (left bundle branch block)    Lymphoma of testis (HCC) 2000   s/p right orchiectomy    Mass of left testis    MGUS (monoclonal gammopathy of unknown significance)    oncologist--- dr Candise Che   Mixed hyperlipidemia 10/26/2020   Personal history of malignant melanoma of skin dermatologist--- dr Karlyn Agee   dx 2001 left calf area  s/p WLE ,  completed one year interferan (03-03-2020 pt states did not involve lymph nodes, no recurrence)   Urgency of urination    Wears hearing aid in both ears     SURGICAL HISTORY: Past Surgical History:  Procedure Laterality Date   CATARACT EXTRACTION W/  INTRAOCULAR LENS IMPLANT Bilateral 2018   IR IMAGING GUIDED PORT INSERTION  03/31/2020   MELANOMA EXCISION  10/1999   ORCHIECTOMY Right 2000   ORCHIECTOMY Left 03/05/2020   Procedure: ORCHIECTOMY;  Surgeon: Bjorn Pippin, MD;  Location: [facility];  Service: Urology;  Laterality: Left;   TONSILLECTOMY  child   TOTAL KNEE ARTHROPLASTY Right 06/2009    SOCIAL HISTORY: Social History   Socioeconomic History   Marital status: Married    Spouse name: Not on file   Number of children: Not on file   Years of education: Not on file   Highest education level: Not on file  Occupational History   Not on file  Tobacco Use   Smoking status: Never   Smokeless tobacco: Never  Vaping Use   Vaping status: Never Used  Substance and Sexual Activity   Alcohol use: Yes    Alcohol/week: 14.0 standard drinks of alcohol  Types: 14 Glasses of wine per week    Comment: 2 wine daily   Drug use: Never   Sexual activity: Not on file  Other Topics Concern   Not on file  Social History Narrative   Social History      Diet?good      Do you drink/eat things with caffeine? seldom      Marital status?          yes                          What year were you married? 1962      Do you live in a house, apartment, assisted living, condo, trailer, etc.? house      Is it one or more stories?1      How many persons live in your home? 2      Do you have any pets in your home? (please list) no      Highest level of education completed? college      Current or past profession: president and CEO of [employer]      Advanced Directives      Do you exercise?   yes                                   Type & how often? Garden work, Therapist, music care      Do you have a living will? yes      Do you have a DNR form?       yes                            If not, do you want to discuss one?      Do you have signed POA/HPOA for forms?  yes      Functional Status      Do you have difficulty bathing or  dressing yourself? no      Do you have difficulty preparing food or eating? no      Do you have difficulty managing your medications? no      Do you have difficulty managing your finances? no      Do you have difficulty affording your medications? no   Social Drivers of Corporate investment banker Strain: Low Risk  (07/14/2022)   Overall Financial Resource Strain (CARDIA)    Difficulty of Paying Living Expenses: Not hard at all  Food Insecurity: No Food Insecurity (07/14/2022)   Hunger Vital Sign    Worried About Running Out of Food in the Last Year: Never true    Ran Out of Food in the Last Year: Never true  Transportation Needs: No Transportation Needs (07/14/2022)   PRAPARE - Administrator, Civil Service (Medical): No    Lack of Transportation (Non-Medical): No  Physical Activity: Insufficiently Active (02/13/2018)   Exercise Vital Sign    Days of Exercise per Week: 2 days    Minutes of Exercise per Session: 20 min  Stress: Stress Concern Present (02/13/2018)   [employer] of Occupational Health - Occupational Stress Questionnaire    Feeling of Stress : To some extent  Social Connections: Socially Integrated (02/13/2018)   Social Connection and Isolation Panel [NHANES]    Frequency of Communication with Friends and Family: More than three times a week    Frequency  of Social Gatherings with Friends and Family: More than three times a week    Attends Religious Services: More than 4 times per year    Active Member of [employer] or Organizations: Yes    Attends Engineer, structural: More than 4 times per year    Marital Status: Married  Catering manager Violence: Not At Risk (02/13/2018)   Humiliation, Afraid, Rape, and Kick questionnaire    Fear of Current or Ex-Partner: No    Emotionally Abused: No    Physically Abused: No    Sexually Abused: No    FAMILY HISTORY: Family History  Problem Relation Age of Onset   Cataracts Mother    Transient ischemic  attack Mother    Asthma Mother    Cataracts Father    Diabetes Father        borderline   Hypertension Father    Hyperlipidemia Father    Hyperlipidemia Son    Hypertension Son    Amblyopia Neg Hx    Blindness Neg Hx    Glaucoma Neg Hx    Macular degeneration Neg Hx    Retinal detachment Neg Hx    Strabismus Neg Hx    Retinitis pigmentosa Neg Hx     ALLERGIES:  is allergic to latex.  MEDICATIONS:  Current Outpatient Medications  Medication Sig Dispense Refill   ABRYSVO 120 MCG/0.5ML injection Inject 0.5 mLs into the muscle once.     aspirin 81 MG EC tablet Take 1 tablet (81 mg total) by mouth daily. Swallow whole. 30 tablet 12   carboxymethylcellulose (REFRESH PLUS) 0.5 % SOLN Place 1 drop into both eyes daily as needed.     Cholecalciferol (D-3-5) 125 MCG (5000 UT) capsule Take 5,000 Units by mouth daily.     clobetasol (TEMOVATE) 0.05 % external solution APPLY TO AFFECTED AREA TWICE A DAY 50 mL 1   FLUAD 0.5 ML injection Inject 0.5 mLs into the muscle once.     fluticasone (FLONASE) 50 MCG/ACT nasal spray SPRAY 2 SPRAYS INTO EACH NOSTRIL EVERY DAY 48 mL 4   losartan (COZAAR) 100 MG tablet TAKE 1 TABLET BY MOUTH EVERY DAY 90 tablet 1   meloxicam (MOBIC) 7.5 MG tablet TAKE 1 TABLET BY MOUTH EVERY DAY 90 tablet 1   Multiple Vitamins-Minerals (PRESERVISION AREDS 2 PO) Take 1 capsule by mouth 2 (two) times daily.      propranolol (INDERAL) 10 MG tablet TAKE 1 TABLET BY MOUTH THREE TIMES A DAY 270 tablet 1   simvastatin (ZOCOR) 40 MG tablet TAKE 1 TABLET BY MOUTH EVERY DAY 90 tablet 1   SPIKEVAX syringe Inject 0.5 mLs into the muscle once.     Testosterone 20.25 MG/ACT (1.62%) GEL Apply 40.5 mg topically daily.     triamcinolone cream (KENALOG) 0.1 % APPLY TO AFFECTED AREA TWICE A DAY 30 g 1   valACYclovir (VALTREX) 1000 MG tablet Take 1 tablet (1,000 mg total) by mouth 2 (two) times daily. Take 1000 (1 grams) PO 2 times daily for 3 days as needed for flares (herpes) 30 tablet 3    vitamin B-12 (CYANOCOBALAMIN) 1000 MCG tablet Take 1,000 mcg by mouth daily.     No current facility-administered medications for this visit.    REVIEW OF SYSTEMS:    10 Point review of Systems was done is negative except as noted above.   PHYSICAL EXAMINATION: ECOG PERFORMANCE STATUS: 0 - Asymptomatic VS reviewed - stable  Vitals:   12/15/23 1153 12/15/23 1155  BP: Marland Kitchen)  152/97 (!) 152/103  Pulse: 78   Resp: 16   Temp: 97.7 F (36.5 C)   SpO2: 97%    Wt Readings from Last 3 Encounters:  08/29/23 182 lb 9.6 oz (82.8 kg)  07/19/23 186 lb 6.4 oz (84.6 kg)  06/09/23 185 lb 12.8 oz (84.3 kg)   Body mass index is 25.63 kg/m.   GENERAL:alert, in no acute distress and comfortable SKIN: no acute rashes, no significant lesions EYES: conjunctiva are pink and non-injected, sclera anicteric OROPHARYNX: MMM, no exudates, no oropharyngeal erythema or ulceration NECK: supple, no JVD LYMPH:  no palpable lymphadenopathy in the cervical, axillary or inguinal regions LUNGS: clear to auscultation b/l with normal respiratory effort HEART: regular rate & rhythm ABDOMEN:  normoactive bowel sounds , non tender, not distended. Extremity: no pedal edema PSYCH: alert & oriented x 3 with fluent speech NEURO: no focal motor/sensory deficits    LABORATORY DATA:  I have reviewed the data as listed  .    Latest Ref Rng & Units 12/15/2023   11:21 AM 06/09/2023   11:31 AM 12/09/2022    1:02 PM  CBC  WBC 4.0 - 10.5 K/uL 6.9  6.4  7.4   Hemoglobin 13.0 - 17.0 g/dL 16.1  09.6  04.5   Hematocrit 39.0 - 52.0 % 48.0  48.3  44.6   Platelets 150 - 400 K/uL 197  193  210    . CBC    Component Value Date/Time   WBC 6.9 12/15/2023 1121   WBC 6.0 08/19/2021 0942   RBC 4.64 12/15/2023 1121   HGB 15.8 12/15/2023 1121   HCT 48.0 12/15/2023 1121   HCT 40.8 12/19/2019 1057   PLT 197 12/15/2023 1121   MCV 103.4 (H) 12/15/2023 1121   MCH 34.1 (H) 12/15/2023 1121   MCHC 32.9 12/15/2023 1121   RDW 13.2  12/15/2023 1121   LYMPHSABS 1.6 12/15/2023 1121   MONOABS 0.9 12/15/2023 1121   EOSABS 0.1 12/15/2023 1121   BASOSABS 0.1 12/15/2023 1121    .    Latest Ref Rng & Units 12/15/2023   11:21 AM 06/09/2023   11:31 AM 12/09/2022    1:02 PM  CMP  Glucose 70 - 99 mg/dL 82  82  409   BUN 8 - 23 mg/dL 17  14  18    Creatinine 0.61 - 1.24 mg/dL 8.11  9.14  7.82   Sodium 135 - 145 mmol/L 141  138  139   Potassium 3.5 - 5.1 mmol/L 4.4  4.2  4.5   Chloride 98 - 111 mmol/L 103  102  102   CO2 22 - 32 mmol/L 32  31  32   Calcium 8.9 - 10.3 mg/dL 9.5  9.2  8.8   Total Protein 6.5 - 8.1 g/dL 6.6  7.2  6.8   Total Bilirubin 0.0 - 1.2 mg/dL 1.1  1.3  1.0   Alkaline Phos 38 - 126 U/L 58  56  51   AST 15 - 41 U/L 23  23  19    ALT 0 - 44 U/L 35  24  24    . Lab Results  Component Value Date   LDH 134 12/15/2023    03/05/2020 Left Testicular Flow Pathology Report [id number]):    03/05/2020 FISH Panel:    03/05/2020 Left Testicular Mass Surgical Pathology ([id number]):    03/01/18 CBC w/diff:   RADIOGRAPHIC STUDIES: I have personally reviewed the radiological images as listed and agreed with the findings in the  report.  03/05/20 of Surgical Pathology ([id number]) SURGICAL PATHOLOGY  CASE: [id number]  PATIENT: Piedad Climes  Surgical Pathology Report  Clinical History: Left testicular mass (jmc)  FINAL MICROSCOPIC DIAGNOSIS:  A. TESTICLE AND CORD, LEFT, ORCHIECTOMY:  - Diffuse large B-cell lymphoma  -See comment  COMMENT:  The sections of the testis and spermatic cord nodules show effacement of  the architecture by an atypical lymphoid infiltrate characterized by  predominance of medium and large lymphoid cells with vesicular chromatin  and small nucleoli associated with brisk mitosis.  The appearance is  primarily diffuse with lack of atypical follicles.  Variable number of  admixed smaller lymphocytes are seen. To further evaluate this process,  flow cytometric  analysis was performed ([id number]) and shows a  monoclonal kappa-restricted B-cell population.  In addition, a battery  of immunohistochemical stains was performed and shows that the atypical  lymphoid cells are positive for CD20, CD79a, PAX 5, CD10 (weak), BCL-2,  MUM-1 and cytoplasmic kappa.  No significant staining is seen with  CD138, cyclin D1, CD30, CD34, TdT, EBV in situ hybridization or  cytoplasmic lambda.  Ki67 shows variable expression ranging from 10% to  >50%.  There is an admixed T-cell population to a lesser extent as seen  with CD3 and CD5 and there is no apparent co-expression of CD5 in B-cell  areas.  The overall features are consistent with involvement by diffuse  large B-cell lymphoma, GCB type.  The results were discussed with Dr.  Annabell Howells and Candise Che on 03/11/2020.    ASSESSMENT & PLAN:   83 y.o. male with  H/o Rt Primary testicular large B cell lymphoma in 2000 treatment with R-CHOP x 6 cycles as part of clinical trial + IT MTX x 4 cycles and RT to left testicle  2 . H/o Relapsed Left Primary testicular large B cell lymphoma 2021 stage I/IIE. Appears to be a new event since it is occurring 20 yrs after his previous rt testicular lymphoma S/p 6 cycles of R-CEOP -- currently in remission.  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  PLAN:  -Discussed lab results on 12/15/2023 in detail with patient. CBC showed WBC of 6.9K, hemoglobin of 15.8, and platelets of 197K. -LDH normal -CMP normal -Patient has no lab or clinical evidence of recurrence of his primary testicular large B-cell lymphoma at this time. -continue to follow with pulmonology regularly for optimal evaluation and management -continue to follow regularly with dermatology -answered all of  patient's questions in detail  FOLLOW-UP: RTC with Dr Candise Che with labs in 6 months  The total time spent in the appointment was 20 minutes* .  All of the patient's questions were answered with apparent satisfaction. The patient knows to call the clinic with any problems, questions or concerns.   Wyvonnia Lora MD MS AAHIVMS [facility] Hematology/Oncology Physician [facility]  .*Total Encounter Time as defined by the Centers for Medicare and Medicaid Services includes, in addition to the face-to-face time of a patient visit (documented in the note above) non-face-to-face time: obtaining and reviewing outside history, ordering and reviewing medications, tests or procedures, care coordination (communications with other health care professionals or caregivers) and documentation in the medical record.    I,Mitra Faeizi,acting as a Neurosurgeon for Wyvonnia Lora, MD.,have documented  all relevant documentation on the behalf of Wyvonnia Lora, MD,as directed by  Wyvonnia Lora, MD while in the presence of Wyvonnia Lora, MD.  .I have reviewed the above documentation for accuracy and completeness, and I agree with the above. Johney Maine MD

## 2023-12-15 ENCOUNTER — Inpatient Hospital Stay (HOSPITAL_BASED_OUTPATIENT_CLINIC_OR_DEPARTMENT_OTHER): Payer: PPO | Admitting: Hematology

## 2023-12-15 ENCOUNTER — Inpatient Hospital Stay: Payer: PPO | Attending: Hematology

## 2023-12-15 VITALS — BP 152/103 | HR 78 | Temp 97.7°F | Resp 16 | Ht 72.0 in | Wt 189.0 lb

## 2023-12-15 DIAGNOSIS — D472 Monoclonal gammopathy: Secondary | ICD-10-CM | POA: Diagnosis not present

## 2023-12-15 DIAGNOSIS — C83398 Diffuse large b-cell lymphoma of other extranodal and solid organ sites: Secondary | ICD-10-CM

## 2023-12-15 DIAGNOSIS — C8519 Unspecified B-cell lymphoma, extranodal and solid organ sites: Secondary | ICD-10-CM | POA: Insufficient documentation

## 2023-12-15 LAB — CMP (CANCER CENTER ONLY)
ALT: 35 U/L (ref 0–44)
AST: 23 U/L (ref 15–41)
Albumin: 4.4 g/dL (ref 3.5–5.0)
Alkaline Phosphatase: 58 U/L (ref 38–126)
Anion gap: 6 (ref 5–15)
BUN: 17 mg/dL (ref 8–23)
CO2: 32 mmol/L (ref 22–32)
Calcium: 9.5 mg/dL (ref 8.9–10.3)
Chloride: 103 mmol/L (ref 98–111)
Creatinine: 0.76 mg/dL (ref 0.61–1.24)
GFR, Estimated: >60 mL/min (ref >60)
Glucose, Bld: 82 mg/dL (ref 70–99)
Potassium: 4.4 mmol/L (ref 3.5–5.1)
Sodium: 141 mmol/L (ref 135–145)
Total Bilirubin: 1.1 mg/dL (ref 0.0–1.2)
Total Protein: 6.6 g/dL (ref 6.5–8.1)

## 2023-12-15 LAB — CBC WITH DIFFERENTIAL (CANCER CENTER ONLY)
Abs Immature Granulocytes: 0.04 10*3/uL (ref 0.00–0.07)
Basophils Absolute: 0.1 10*3/uL (ref 0.0–0.1)
Basophils Relative: 1 %
Eosinophils Absolute: 0.1 10*3/uL (ref 0.0–0.5)
Eosinophils Relative: 1 %
HCT: 48 % (ref 39.0–52.0)
Hemoglobin: 15.8 g/dL (ref 13.0–17.0)
Immature Granulocytes: 1 %
Lymphocytes Relative: 23 %
Lymphs Abs: 1.6 10*3/uL (ref 0.7–4.0)
MCH: 34.1 pg — ABNORMAL HIGH (ref 26.0–34.0)
MCHC: 32.9 g/dL (ref 30.0–36.0)
MCV: 103.4 fL — ABNORMAL HIGH (ref 80.0–100.0)
Monocytes Absolute: 0.9 10*3/uL (ref 0.1–1.0)
Monocytes Relative: 13 %
Neutro Abs: 4.2 10*3/uL (ref 1.7–7.7)
Neutrophils Relative %: 61 %
Platelet Count: 197 10*3/uL (ref 150–400)
RBC: 4.64 MIL/uL (ref 4.22–5.81)
RDW: 13.2 % (ref 11.5–15.5)
WBC Count: 6.9 10*3/uL (ref 4.0–10.5)
nRBC: 0 % (ref 0.0–0.2)

## 2023-12-15 LAB — LACTATE DEHYDROGENASE: LDH: 134 U/L (ref 98–192)

## 2023-12-19 LAB — MULTIPLE MYELOMA PANEL, SERUM
Albumin SerPl Elph-Mcnc: 3.9 g/dL (ref 2.9–4.4)
Albumin/Glob SerPl: 1.6 (ref 0.7–1.7)
Alpha 1: 0.2 g/dL (ref 0.0–0.4)
Alpha2 Glob SerPl Elph-Mcnc: 0.6 g/dL (ref 0.4–1.0)
B-Globulin SerPl Elph-Mcnc: 0.8 g/dL (ref 0.7–1.3)
Gamma Glob SerPl Elph-Mcnc: 0.9 g/dL (ref 0.4–1.8)
Globulin, Total: 2.5 g/dL (ref 2.2–3.9)
IgA: 88 mg/dL (ref 61–437)
IgG (Immunoglobin G), Serum: 618 mg/dL (ref 603–1613)
IgM (Immunoglobulin M), Srm: 412 mg/dL — ABNORMAL HIGH (ref 15–143)
M Protein SerPl Elph-Mcnc: 0.3 g/dL — ABNORMAL HIGH
Total Protein ELP: 6.4 g/dL (ref 6.0–8.5)

## 2024-01-11 NOTE — Progress Notes (Signed)
 [facility] - Clinic Note  01/12/2024     CHIEF COMPLAINT Patient presents for Retina Follow Up   HISTORY OF PRESENT ILLNESS: Antonio [name] is a 83 y.o. male who presents to the clinic today for:   HPI     Retina Follow Up   Patient presents with  Dry AMD.  In both eyes.  This started 9 months ago.  Duration of 9 months.  Since onset it is stable.  I, the attending physician,  performed the HPI with the patient and updated documentation appropriately.        Comments   9 month retina follow up ARMD pt is reporting no vision changes noticed he denies any flashes or floaters       Last edited by Rennis Chris, MD on 01/12/2024 12:54 PM.    Pt states he has noticed double vision when he gets tired   Referring physician: Mahlon Gammon, MD [address] [place],  [place] [address]  HISTORICAL INFORMATION:   Selected notes from the MEDICAL RECORD NUMBER Self-referral for ARMD OU; Moved from [place], [place]  Ocular Hx - nonexudative ARMD OU; PVD OU; pseudophakia OU (Toric OU - 12/2016 by Dr. Vivien Presto, [place], [place]);  PMH - HTN; [facility] - ARMD - Mother   CURRENT MEDICATIONS: Current Outpatient Medications (Ophthalmic Drugs)  Medication Sig   carboxymethylcellulose (REFRESH PLUS) 0.5 % SOLN Place 1 drop into both eyes daily as needed.   No current facility-administered medications for this visit. (Ophthalmic Drugs)   Current Outpatient Medications (Other)  Medication Sig   ABRYSVO 120 MCG/0.5ML injection Inject 0.5 mLs into the muscle once.   aspirin 81 MG EC tablet Take 1 tablet (81 mg total) by mouth daily. Swallow whole.   Cholecalciferol (D-3-5) 125 MCG (5000 UT) capsule Take 5,000 Units by mouth daily.   clobetasol (TEMOVATE) 0.05 % external solution APPLY TO AFFECTED AREA TWICE A DAY   FLUAD 0.5 ML injection Inject 0.5 mLs into the muscle once.   fluticasone (FLONASE) 50 MCG/ACT nasal spray SPRAY 2 SPRAYS INTO EACH NOSTRIL EVERY DAY    losartan (COZAAR) 100 MG tablet TAKE 1 TABLET BY MOUTH EVERY DAY   meloxicam (MOBIC) 7.5 MG tablet TAKE 1 TABLET BY MOUTH EVERY DAY   Multiple Vitamins-Minerals (PRESERVISION AREDS 2 PO) Take 1 capsule by mouth 2 (two) times daily.    propranolol (INDERAL) 10 MG tablet TAKE 1 TABLET BY MOUTH THREE TIMES A DAY   simvastatin (ZOCOR) 40 MG tablet TAKE 1 TABLET BY MOUTH EVERY DAY   SPIKEVAX syringe Inject 0.5 mLs into the muscle once.   Testosterone 20.25 MG/ACT (1.62%) GEL Apply 40.5 mg topically daily.   triamcinolone cream (KENALOG) 0.1 % APPLY TO AFFECTED AREA TWICE A DAY   valACYclovir (VALTREX) 1000 MG tablet Take 1 tablet (1,000 mg total) by mouth 2 (two) times daily. Take 1000 (1 grams) PO 2 times daily for 3 days as needed for flares (herpes)   vitamin B-12 (CYANOCOBALAMIN) 1000 MCG tablet Take 1,000 mcg by mouth daily.   No current facility-administered medications for this visit. (Other)   REVIEW OF SYSTEMS: ROS   Positive for: Neurological, Genitourinary, Musculoskeletal, Endocrine, Cardiovascular, Eyes, Heme/Lymph Negative for: Constitutional, Gastrointestinal, Skin, HENT, Respiratory, Psychiatric, Allergic/Imm Last edited by Etheleen Mayhew, COT on 01/12/2024  9:18 AM.        ALLERGIES Allergies  Allergen Reactions   Latex Itching    Latex tape causes itching and redness.  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   ED (erectile dysfunction)    Elevated diaphragm    right   Erectile dysfunction    History of basal cell carcinoma (BCC) excision    multiple excision in office   History of kidney stones    History of  seizure    03-03-2020 per pt x1 while taking interferan for melanoma in 2001 ,  none since, told a side effect of interferan   History of squamous cell carcinoma excision    multiple skin excision in office   Hyperlipidemia    Hypertension    followed by pcp  (03-03-2020 per pt had a stress test approx. 2010, told normal)   LBBB (left bundle branch block)    Lymphoma of testis (HCC) 2000   s/p right orchiectomy    Mass of left testis    MGUS (monoclonal gammopathy of unknown significance)    oncologist--- dr Candise Che   Mixed hyperlipidemia 10/26/2020   Personal history of malignant melanoma of skin dermatologist--- dr Karlyn Agee   dx 2001 left calf area  s/p WLE ,  completed one year interferan (03-03-2020 pt states did not involve lymph nodes, no recurrence)   Urgency of urination    Wears hearing aid in both ears    Past Surgical History:  Procedure Laterality Date   CATARACT EXTRACTION W/ INTRAOCULAR LENS IMPLANT Bilateral 2018   IR IMAGING GUIDED PORT INSERTION  03/31/2020   MELANOMA EXCISION  10/1999   ORCHIECTOMY Right 2000   ORCHIECTOMY Left 03/05/2020   Procedure: ORCHIECTOMY;  Surgeon: Bjorn Pippin, MD;  Location: [facility];  Service: Urology;  Laterality: Left;   TONSILLECTOMY  child   TOTAL KNEE ARTHROPLASTY Right 06/2009   FAMILY HISTORY Family History  Problem Relation Age of Onset   Cataracts Mother    Transient ischemic attack Mother    Asthma Mother    Cataracts Father    Diabetes Father        borderline   Hypertension Father    Hyperlipidemia Father    Hyperlipidemia Son    Hypertension Son    Amblyopia Neg Hx    Blindness Neg Hx    Glaucoma Neg Hx    Macular degeneration Neg Hx    Retinal detachment Neg Hx    Strabismus Neg Hx    Retinitis pigmentosa Neg Hx    SOCIAL HISTORY Social History   Tobacco Use   Smoking status: Never   Smokeless tobacco: Never  Vaping Use   Vaping status: Never Used  Substance Use Topics   Alcohol use:  Yes    Alcohol/week: 14.0 standard drinks of alcohol    Types: 14 Glasses of wine per week    Comment: 2 wine daily   Drug use: Never       OPHTHALMIC EXAM: Base Eye Exam     Visual Acuity (Snellen - Linear)       Right Left   Dist cc 20/20 20/20 -1         Tonometry (Tonopen, 9:21 AM)       Right Left   Pressure 19 18  Pupils       Pupils Dark Light Shape React APD   Right PERRL 3 2 Round Brisk None   Left PERRL 3 2 Round Brisk None         Visual Fields       Left Right    Full Full         Extraocular Movement       Right Left    Full, Ortho Full, Ortho         Neuro/Psych     Oriented x3: Yes   Mood/Affect: Normal         Dilation     Both eyes: 2.5% Phenylephrine @ 9:21 AM           Slit Lamp and Fundus Exam     External Exam       Right Left   External Brow ptosis - mild Brow ptosis - mild         Slit Lamp Exam       Right Left   Lids/Lashes Dermatochalasis - upper lid Dermatochalasis - upper lid   Conjunctiva/Sclera White and quiet White and quiet   Cornea Arcus, Well healed temporal cataract wounds, trace PEE Arcus, Well healed cataract wounds, trace PEE   Anterior Chamber deep and clear deep and clear   Iris Round and dilated Round and dilated   Lens toric Posterior chamber intraocular lens in good postion with marks at 0300 and 0900, trace Posterior capsular opacification Toric Posterior chamber intraocular lens in good position with marks at 0200 and 0800; 1+Posterior capsular opacification   Anterior Vitreous Vitreous syneresis, Posterior vitreous detachment, vitreous condensations Vitreous syneresis, Posterior vitreous detachment, vitreous condensations         Fundus Exam       Right Left   Disc Mild pallor, sharp rim, Compact, Tilted with Peripapillary atrophy 360 compact, mild tilt, Peripapillary atrophy 360, mild Pallor, Sharp rim   C/D Ratio 0.4 0.3   Macula Flat, Blunted foveal reflex, Drusen,  RPE mottling, clumping and atrophy - stable and mild, No heme or edema, focal atrophy Good foveal reflex, Drusen, RPE mottling, clumping and atrophy - stable and mild, No heme or edema, focal atrophy   Vessels attenuated, mild tortuosity attenuated, mild tortuosity   Periphery Attached; scattered Reticular degeneration, No heme Attached; scattered Reticular degeneration, No heme           Refraction     Wearing Rx       Sphere Cylinder Axis Add   Right -0.75 +0.50 106 +2.50   Left -0.50 +0.75 121 +2.50    Type: PAL            IMAGING AND PROCEDURES  Imaging and Procedures for 11/10/17  OCT, Retina - OU - Both Eyes       Right Eye Quality was good. Central Foveal Thickness: 317. Progression has been stable. Findings include normal foveal contour, no IRF, no SRF, myopic contour, retinal drusen , outer retinal atrophy (Stable focal areas of outer retinal atrophy / ellipsoid dropout; scattered drusen).   Left Eye Quality was good. Central Foveal Thickness: 323. Progression has been stable. Findings include normal foveal contour, no IRF, no SRF, myopic contour, retinal drusen , outer retinal atrophy (Scattered drusen).   Notes Images captured and stored on drive  Diagnosis / Impression:  Stable nonexudative ARMD OU - no significant progression from prior  Clinical management:  See below  Abbreviations: NFP - Normal foveal profile. CME -  cystoid macular edema. PED - pigment epithelial detachment. IRF - intraretinal fluid. SRF - subretinal fluid. EZ - ellipsoid zone. ERM - epiretinal membrane. ORA - outer retinal atrophy. ORT - outer retinal tubulation. SRHM - subretinal hyper-reflective material              ASSESSMENT/PLAN:    ICD-10-CM   1. Intermediate stage nonexudative age-related macular degeneration of both eyes  H35.3132 OCT, Retina - OU - Both Eyes    2. Pseudophakia of both eyes  Z96.1     3. 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 Bilateral  posterior capsular opacification  H26.493       1. Age related macular degeneration, non-exudative, both eyes   - intermediate stage -- stable  - The incidence, anatomy, and pathology of dry AMD, risk of progression, and the AREDS and AREDS 2 study including smoking risks discussed with patient.   - no longer using Foresee AMD monitoring device due to insurance issues  - BCVA 20/20 OU  - no significant progression on exam or OCT -- scattered drusen and ORA  - f/u 9-12 months, sooner PRN -- DFE/OCT  2. Pseudophakia OU  - s/p CE/PCIOL OU (Toric + femto) 12/2016 by Dr. Vivien Presto in [facility]  - had post op IOP issue OS  - beautiful surgeries, doing well  - monitor   3. H/o ocular hypertension OU  - IOP good today (19,18) off combigan  - monitor   4. PCO OU  - not yet visually significant  - monitor for now  Ophthalmic Meds Ordered this visit:  No orders of the defined types were placed in this encounter.    Return for f/u 9-12 months, non-exu ARMD OU, DFE, OCT.  There are no Patient Instructions on file for this visit.  This document serves as a record of services personally performed by Karie Chimera, MD, PhD. It was created on their behalf by Berlin Hun COT, an ophthalmic technician. The creation of this record is the provider's dictation and/or activities during the visit.    Electronically signed by: Berlin Hun COT 03.28.25 12:55 PM  This document serves as a record of services personally performed by Karie Chimera, MD, PhD. It was created on their behalf by Glee Arvin. Manson Passey, OA an ophthalmic technician. The creation of this record is the provider's dictation and/or activities during the visit.    Electronically signed by: Glee Arvin. Manson Passey, OA 01/12/24 12:55 PM  Karie Chimera, M.D., Ph.D. Diseases & Surgery of the Retina and Vitreous [facility] 01/12/2024   I have reviewed the above documentation for accuracy and  completeness, and I agree with the above. Karie Chimera, M.D., Ph.D. 01/12/24 12:56 PM   Abbreviations: M myopia (nearsighted); A astigmatism; H hyperopia (farsighted); P presbyopia; Mrx spectacle prescription;  CTL contact lenses; OD right eye; OS left eye; OU both eyes  XT exotropia; ET esotropia; PEK punctate epithelial keratitis; PEE punctate epithelial erosions; DES dry eye syndrome; MGD meibomian gland dysfunction; ATs artificial tears; PFAT's preservative free artificial tears; NSC nuclear sclerotic cataract; PSC posterior subcapsular cataract; ERM epi-retinal membrane; PVD posterior vitreous detachment; RD retinal detachment; DM diabetes mellitus; DR diabetic retinopathy; NPDR non-proliferative diabetic retinopathy; PDR proliferative diabetic retinopathy; CSME clinically significant macular edema; DME diabetic macular edema; dbh dot blot hemorrhages; CWS cotton wool spot; POAG primary open angle glaucoma; C/D cup-to-disc ratio; HVF humphrey visual field; GVF goldmann visual field; OCT optical coherence tomography;  IOP intraocular pressure; BRVO Branch retinal vein occlusion; CRVO central retinal vein occlusion; CRAO central retinal artery occlusion; BRAO branch retinal artery occlusion; RT retinal tear; SB scleral buckle; PPV pars plana vitrectomy; VH Vitreous hemorrhage; PRP panretinal laser photocoagulation; IVK intravitreal kenalog; VMT vitreomacular traction; MH Macular hole;  NVD neovascularization of the disc; NVE neovascularization elsewhere; AREDS age related eye disease study; ARMD age related macular degeneration; POAG primary open angle glaucoma; EBMD epithelial/anterior basement membrane dystrophy; ACIOL anterior chamber intraocular lens; IOL intraocular lens; PCIOL posterior chamber intraocular lens; Phaco/IOL phacoemulsification with intraocular lens placement; PRK photorefractive keratectomy; LASIK laser assisted in situ keratomileusis; HTN hypertension; DM diabetes mellitus; COPD  chronic obstructive pulmonary disease

## 2024-01-12 ENCOUNTER — Ambulatory Visit (INDEPENDENT_AMBULATORY_CARE_PROVIDER_SITE_OTHER): Payer: PPO | Admitting: Ophthalmology

## 2024-01-12 ENCOUNTER — Encounter (INDEPENDENT_AMBULATORY_CARE_PROVIDER_SITE_OTHER): Payer: Self-pay | Admitting: Ophthalmology

## 2024-01-12 DIAGNOSIS — H40053 Ocular hypertension, bilateral: Secondary | ICD-10-CM | POA: Diagnosis not present

## 2024-01-12 DIAGNOSIS — H26493 Other secondary cataract, bilateral: Secondary | ICD-10-CM

## 2024-01-12 DIAGNOSIS — H353132 Nonexudative age-related macular degeneration, bilateral, intermediate dry stage: Secondary | ICD-10-CM

## 2024-01-12 DIAGNOSIS — Z961 Presence of intraocular lens: Secondary | ICD-10-CM | POA: Diagnosis not present

## 2024-01-16 ENCOUNTER — Ambulatory Visit: Payer: Self-pay

## 2024-01-16 NOTE — Telephone Encounter (Signed)
 Pt is requesting Zofran script, [place] and unable to do virtual visit. Pt would like a call back for pharmacy information as he needs to find one close to him.  Chief Complaint: Vomiting Symptoms: nausea Frequency: Ongoing since yesterday Pertinent Negatives: Patient denies fever, urinary symptoms, abd pain Disposition: [] ED /[] Urgent Care (no appt availability in office) / [] Appointment(In office/virtual)/ []  [place] [facility]/ [x] Home Care/ [] Refused Recommended Disposition /[] [place] [facility]/ []  Follow-up with PCP Additional Notes: Pt reports he is camping in [place] and noted loss of appetite/nausea yesterday that resulted in 4 episodes of vomiting. Pt reports vomiting has resolved today but he continues with N/D. Denies fever, abd pain, urinary symptoms, dizziness. This RN educated pt on home care, new-worsening symptoms, when to call back/seek emergent care. Pt verbalized understanding and agrees to plan.   Reason for Disposition  MILD or MODERATE vomiting (e.g., 1 - 5 times / day)  Answer Assessment - Initial Assessment Questions 1. VOMITING SEVERITY: "How many times have you vomited in the past 24 hours?"     - MILD:  1 - 2 times/day    - MODERATE: 3 - 5 times/day, decreased oral intake without significant weight loss or symptoms of dehydration    - SEVERE: 6 or more times/day, vomits everything or nearly everything, with significant weight loss, symptoms of dehydration      4 2. ONSET: "When did the vomiting begin?"      Yesterday 3. FLUIDS: "What fluids or food have you vomited up today?" "Have you been able to keep any fluids down?"     Pt able to drink 4. ABDOMEN PAIN: "Are your having any abdomen pain?" If Yes : "How bad is it and what does it feel like?" (e.g., crampy, dull, intermittent, constant)      None 5. DIARRHEA: "Is there any diarrhea?" If Yes, ask: "How many times today?"      Yes 6. CONTACTS: "Is there anyone else in the family  with the same symptoms?"      None 7. CAUSE: "What do you think is causing your vomiting?"     Unknown 8. HYDRATION STATUS: "Any signs of dehydration?" (e.g., dry mouth [not only dry lips], too weak to stand) "When did you last urinate?"     None 9. OTHER SYMPTOMS: "Do you have any other symptoms?" (e.g., fever, headache, vertigo, vomiting blood or coffee grounds, recent head injury)     None  Protocols used: Vomiting-A-AH

## 2024-01-17 NOTE — Telephone Encounter (Signed)
 Spoke with patient and he states that he is feeling better at the moment and doesn't feel the need for any assistance at this time. I will send message to PCP as a [place]

## 2024-01-18 ENCOUNTER — Telehealth: Payer: Self-pay

## 2024-01-18 ENCOUNTER — Ambulatory Visit: Payer: Self-pay

## 2024-01-18 NOTE — Telephone Encounter (Signed)
 Copied from CRM [id number]. Topic: Clinical - Medication Question >> Jan 18, 2024  9:48 AM Maxwell Marion wrote: Reason for CRM: Patient called asking about medication that was called in for him yesterday. Let patient know I wasn't seeing that a medication was called in and that there's a note that he stated he was feeling better and didn't need any assistance at this time. Patient stated he would still like something to be called in for him to help with the drainage, says he will be back in [place] later today and medication can be [facility] into [facility] on [address].   The last time this patient seen was in November. Patient would need an appointment.   I called patient and left a detailed message informing him that we have openings today at our [address] location and he needs to call to schedule an appointment.

## 2024-01-18 NOTE — Telephone Encounter (Signed)
 Chief Complaint: sinus drainage Symptoms: sinus drainage, cough Frequency: 4-5 days Pertinent Negatives: Patient denies fever, CP, SOB, pain Disposition: [] ED /[] Urgent Care (no appt availability in office) / [x] Appointment(In office/virtual)/ []  [place] [facility]/ [] Home Care/ [] Refused Recommended Disposition /[] [place] [facility]/ []  Follow-up with PCP Additional Notes: Pt reports 4-5 days of sinus drainage/congestion and cough after hunting turkeys in the woods and encounter a lot of pollen. Pt states the drainage is yellow/green. Pt denies fever, CP, or SOB other than nasal congestion. Pt reports frequent cough. States he gets a sinus infection every year. RN reviewed home care with the patient. RN also advised pt should be seen within 3 days. Pt agreeable. RN scheduled pt for tomorrow at 1040. RN advised pt he needs to call us back if he gets worse and that he has to call 911 for SOB or CP. Pt. Verbalized understanding.     Copied from CRM [id number]. Topic: Clinical - Medical Advice >> Jan 18, 2024  4:57 PM Adrianna P wrote: Reason for CRM: patient having bad sinus drainage and would like to know what to take Reason for Disposition  Lots of coughing  Answer Assessment - Initial Assessment Questions 1. LOCATION: "Where does it hurt?"      No pain 2. ONSET: "When did the sinus pain start?"  (e.g., hours, days)      4-5 days of sinus drainage after hunting turkeys for 2 wks 3. SEVERITY: "How bad is the pain?"   (Scale 1-10; mild, moderate or severe)   - MILD (1-3): doesn't interfere with normal activities    - MODERATE (4-7): interferes with normal activities (e.g., work or school) or awakens from sleep   - SEVERE (8-10): excruciating pain and patient unable to do any normal activities        No pain 4. RECURRENT SYMPTOM: "Have you ever had sinus problems before?" If Yes, ask: "When was the last time?" and "What happened that time?"      Yes - every year 5. NASAL CONGESTION:  "Is the nose blocked?" If Yes, ask: "Can you open it or must you breathe through your mouth?"     Has congestion but he can breathe through his nose  6. NASAL DISCHARGE: "Do you have discharge from your nose?" If so ask, "What color?"     Green/yellow 7. FEVER: "Do you have a fever?" If Yes, ask: "What is it, how was it measured, and when did it start?"      No 8. OTHER SYMPTOMS: "Do you have any other symptoms?" (e.g., sore throat, cough, earache, difficulty breathing) Green/yellow drainage, "getting choked up, have to cough", denies SOB or difficulty breathing, denies headache  Protocols used: Sinus Pain or Congestion-A-AH

## 2024-01-19 ENCOUNTER — Ambulatory Visit (INDEPENDENT_AMBULATORY_CARE_PROVIDER_SITE_OTHER): Admitting: Adult Health

## 2024-01-19 VITALS — BP 116/74 | HR 74 | Temp 96.0°F | Resp 19 | Ht 72.0 in | Wt 183.6 lb

## 2024-01-19 DIAGNOSIS — Z8572 Personal history of non-Hodgkin lymphomas: Secondary | ICD-10-CM

## 2024-01-19 DIAGNOSIS — J302 Other seasonal allergic rhinitis: Secondary | ICD-10-CM | POA: Diagnosis not present

## 2024-01-19 DIAGNOSIS — G4733 Obstructive sleep apnea (adult) (pediatric): Secondary | ICD-10-CM

## 2024-01-19 MED ORDER — GUAIFENESIN ER 600 MG PO TB12: 600.0000 mg | ORAL_TABLET | Freq: Two times a day (BID) | ORAL | 0 refills | Status: AC

## 2024-01-19 MED ORDER — LORATADINE 10 MG PO TABS: 10.0000 mg | ORAL_TABLET | Freq: Every day | ORAL | 11 refills | Status: DC | PRN

## 2024-01-19 NOTE — Progress Notes (Signed)
 [facility]  Provider:  Kenard Gower DNP  Code Status:  DNR  Goals of Care:     08/29/2023    3:41 PM  Advanced Directives  Does Patient Have a Medical Advance Directive? Yes  Type of Estate agent of [place];Living will;Out of facility DNR (pink MOST or yellow form)  Does patient want to make changes to medical advance directive? No - Patient declined  Copy of Healthcare Power of Attorney in Chart? No - copy requested  Would patient like information on creating a medical advance directive? No - Patient declined     Chief Complaint  Patient presents with   Cough   Discussed the use of AI scribe software for clinical note transcription with the patient, who gave verbal consent to proceed.   HPI: Patient is a 83 y.o. male seen today for an acute visit for worsening cough.  He has been experiencing a cough for approximately three to four weeks, which has been progressively worsening. The cough is associated with phlegm that is green to yellow in color, though he describes it as 'very light.' No fever or chills are present. He mentions a history of persistent drainage, which has worsened recently. No wheezing, but he feels 'close' to it at times. He also has watery and red eyes.  He has been taking Mucinex, having taken three pills in total, with two taken yesterday and one this morning. He also uses Flonase nasal spray, 50 mcg, two sprays in each nostril daily. He has not tried Claritin before.  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 He has a right chest port-a-cath, which he believes was used during his COVID treatment. A CT scan of his lungs on November 10, 2023, showed unchanged pulmonary parenchymal pattern of fibrosis. He denies having COPD and has never smoked.  He has obstructive sleep apnea and owns a CPAP machine, but does not use it regularly due to discomfort. He wakes up multiple times at  night, possibly related to sleep apnea.      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   ED (erectile dysfunction)    Elevated diaphragm    right   Erectile dysfunction    History of basal cell carcinoma (BCC) excision    multiple excision in office   History of kidney stones    History of seizure    03-03-2020 per pt x1 while taking interferan for melanoma in 2001 ,  none since, told a side effect of interferan   History of squamous cell carcinoma excision    multiple skin excision in office   Hyperlipidemia    Hypertension    followed by pcp  (03-03-2020 per pt had a stress test approx. 2010, told normal)   LBBB (left bundle branch block)    Lymphoma of testis [facility]) 2000   s/p right orchiectomy    Mass of left testis    MGUS (monoclonal gammopathy of unknown significance)    oncologist--- dr Candise Che   Mixed hyperlipidemia 10/26/2020   Personal history of malignant melanoma of skin dermatologist--- dr Karlyn Agee   dx 2001 left calf  area  s/p WLE ,  completed one year interferan (03-03-2020 pt states did not involve lymph nodes, no recurrence)   Urgency of urination    Wears hearing aid in both ears     Past Surgical History:  Procedure Laterality Date   CATARACT EXTRACTION W/ INTRAOCULAR LENS IMPLANT Bilateral 2018   IR IMAGING GUIDED PORT INSERTION  03/31/2020   MELANOMA EXCISION  10/1999   ORCHIECTOMY Right 2000   ORCHIECTOMY Left 03/05/2020   Procedure: ORCHIECTOMY;  Surgeon: Bjorn Pippin, MD;  [facility];  Service: Urology;  Laterality: Left;   TONSILLECTOMY  child   TOTAL KNEE ARTHROPLASTY Right 06/2009    Allergies   Allergen Reactions   Latex Itching    Latex tape causes itching and redness.    Outpatient Encounter Medications as of 01/19/2024  Medication Sig   aspirin 81 MG EC tablet Take 1 tablet (81 mg total) by mouth daily. Swallow whole.   carboxymethylcellulose (REFRESH PLUS) 0.5 % SOLN Place 1 drop into both eyes daily as needed.   Cholecalciferol (D-3-5) 125 MCG (5000 UT) capsule Take 5,000 Units by mouth daily.   clobetasol (TEMOVATE) 0.05 % external solution APPLY TO AFFECTED AREA TWICE A DAY   FLUAD 0.5 ML injection Inject 0.5 mLs into the muscle once.   fluticasone (FLONASE) 50 MCG/ACT nasal spray SPRAY 2 SPRAYS INTO EACH NOSTRIL EVERY DAY   guaiFENesin (MUCINEX) 600 MG 12 hr tablet Take 1 tablet (600 mg total) by mouth 2 (two) times daily for 14 days.   loratadine (CLARITIN) 10 MG tablet Take 1 tablet (10 mg total) by mouth daily as needed for allergies.   losartan (COZAAR) 100 MG tablet TAKE 1 TABLET BY MOUTH EVERY DAY   meloxicam (MOBIC) 7.5 MG tablet TAKE 1 TABLET BY MOUTH EVERY DAY   Multiple Vitamins-Minerals (PRESERVISION AREDS 2 PO) Take 1 capsule by mouth 2 (two) times daily.    propranolol (INDERAL) 10 MG tablet TAKE 1 TABLET BY MOUTH THREE TIMES A DAY   simvastatin (ZOCOR) 40 MG tablet TAKE 1 TABLET BY MOUTH EVERY DAY   Testosterone 20.25 MG/ACT (1.62%) GEL Apply 40.5 mg topically daily.   triamcinolone cream (KENALOG) 0.1 % APPLY TO AFFECTED AREA TWICE A DAY   valACYclovir (VALTREX) 1000 MG tablet Take 1 tablet (1,000 mg total) by mouth 2 (two) times daily. Take 1000 (1 grams) PO 2 times daily for 3 days as needed for flares (herpes)   vitamin B-12 (CYANOCOBALAMIN) 1000 MCG tablet Take 1,000 mcg by mouth daily.   ABRYSVO 120 MCG/0.5ML injection Inject 0.5 mLs into the muscle once. (Patient not taking: Reported on 01/19/2024)   SPIKEVAX syringe Inject 0.5 mLs into the muscle once. (Patient not taking: Reported on 01/19/2024)   No facility-administered encounter medications on file  as of 01/19/2024.    Review of Systems:  Review of Systems  Constitutional:  Negative for activity change, appetite change and fever.  HENT:  Negative for sore throat.   Eyes: Negative.   Respiratory:  Positive for cough.   Cardiovascular:  Negative for chest pain and leg swelling.  Gastrointestinal:  Negative for abdominal distention, diarrhea and vomiting.  Genitourinary:  Negative for dysuria, frequency and urgency.  Skin:  Negative for color change.  Neurological:  Negative for dizziness and headaches.  Psychiatric/Behavioral:  Negative for behavioral problems and sleep disturbance. The patient is not nervous/anxious.     Health Maintenance  Topic Date Due   COVID-19 Vaccine (8 - Moderna risk  2024-25 season) 02/04/2024 (Originally 12/28/2023)   Medicare Annual Wellness (AWV)  02/20/2024 (Originally 08/13/2021)   DTaP/Tdap/Td (2 - Td or Tdap) 03/04/2024   INFLUENZA VACCINE  05/17/2024   Pneumonia Vaccine 40+ Years old  Completed   Zoster Vaccines- Shingrix  Completed   HPV VACCINES  Aged Out    Physical Exam: Vitals:   01/19/24 1037  BP: 116/74  Pulse: 74  Resp: 19  Temp: (!) 96 F (35.6 C)  SpO2: 94%  Weight: 183 lb 9.6 oz (83.3 kg)  Height: 6' (1.829 m)   Body mass index is 24.9 kg/m. Physical Exam Constitutional:      Appearance: Normal appearance.  HENT:     Head: Normocephalic and atraumatic.     Mouth/Throat:     Mouth: Mucous membranes are moist.  Eyes:     Conjunctiva/sclera: Conjunctivae normal.  Cardiovascular:     Rate and Rhythm: Normal rate and regular rhythm.     Pulses: Normal pulses.     Heart sounds: Normal heart sounds.  Pulmonary:     Effort: Pulmonary effort is normal.     Breath sounds: Normal breath sounds.     Comments: Right chest porta cath Abdominal:     General: Bowel sounds are normal.     Palpations: Abdomen is soft.  Musculoskeletal:        General: No swelling. Normal range of motion.     Cervical back: Normal range of  motion.  Skin:    General: Skin is warm and dry.  Neurological:     General: No focal deficit present.     Mental Status: He is alert and oriented to person, place, and time.  Psychiatric:        Mood and Affect: Mood normal.        Behavior: Behavior normal.        Thought Content: Thought content normal.        Judgment: Judgment normal.     Labs reviewed: Basic Metabolic Panel: Recent Labs    06/09/23 1131 08/22/23 0000 12/15/23 1121  NA 138  --  141  K 4.2  --  4.4  CL 102  --  103  CO2 31  --  32  GLUCOSE 82  --  82  BUN 14  --  17  CREATININE 0.84  --  0.76  CALCIUM 9.2  --  9.5  TSH  --  2.53  --    Liver Function Tests: Recent Labs    06/09/23 1131 12/15/23 1121  AST 23 23  ALT 24 35  ALKPHOS 56 58  BILITOT 1.3* 1.1  PROT 7.2 6.6  ALBUMIN 4.6 4.4   No results for input(s): "LIPASE", "AMYLASE" in the last 8760 hours. No results for input(s): "AMMONIA" in the last 8760 hours. CBC: Recent Labs    06/09/23 1131 12/15/23 1121  WBC 6.4 6.9  NEUTROABS 3.9 4.2  HGB 16.3 15.8  HCT 48.3 48.0  MCV 104.3* 103.4*  PLT 193 197   Lipid Panel: Recent Labs    08/22/23 0000  CHOL 135  HDL 72*  LDLCALC 49  TRIG 68   Lab Results  Component Value Date   HGBA1C 5.7 02/27/2020    Procedures since last visit: OCT, Retina - OU - Both Eyes Result Date: 01/12/2024 Right Eye Quality was good. Central Foveal Thickness: 317. Progression has been stable. Findings include normal foveal contour, no IRF, no SRF, myopic contour, retinal drusen , outer retinal atrophy (Stable focal  areas of outer retinal atrophy / ellipsoid dropout; scattered drusen). Left Eye Quality was good. Central Foveal Thickness: 323. Progression has been stable. Findings include normal foveal contour, no IRF, no SRF, myopic contour, retinal drusen , outer retinal atrophy (Scattered drusen). Notes Images captured and stored on drive Diagnosis / Impression: Stable nonexudative ARMD OU - no  significant progression from prior Clinical management: See below Abbreviations: NFP - Normal foveal profile. CME - cystoid macular edema. PED - pigment epithelial detachment. IRF - intraretinal fluid. SRF - subretinal fluid. EZ - ellipsoid zone. ERM - epiretinal membrane. ORA - outer retinal atrophy. ORT - outer retinal tubulation. SRHM - subretinal hyper-reflective material    Assessment/Plan  1. Seasonal allergies (Primary) -  Likely due to seasonal allergies, exacerbated by pollen exposure. Non-bacterial etiology suggested by absence of fever or chills. - Prescribe loratadine daily for seasonal allergies. - Continue Flonase nasal spray as directed. - Advised taking Mucinex twice daily for two weeks. - Instructed to return if phlegm becomes dark or brown. - loratadine (CLARITIN) 10 MG tablet; Take 1 tablet (10 mg total) by mouth daily as needed for allergies.  Dispense: 30 tablet; Refill: 11 - guaiFENesin (MUCINEX) 600 MG 12 hr tablet; Take 1 tablet (600 mg total) by mouth 2 (two) times daily for 14 days.  Dispense: 28 tablet; Refill: 0  2. History of B-cell lymphoma - Currently inactive and under monitoring without active treatment. -  follows up with oncology  3. OSA (obstructive sleep apnea) -  Not using CPAP due to discomfort and skepticism. Discussed benefits of CPAP in improving lung function and reducing symptoms. - Encourage resuming CPAP therapy. - Follow up with pulmonary specialist for potential adjustments.      Labs/tests ordered:   None   Return if symptoms worsen or fail to improve.  Kenard Gower, NP

## 2024-01-19 NOTE — Telephone Encounter (Signed)
 Noted.

## 2024-02-14 DIAGNOSIS — E291 Testicular hypofunction: Secondary | ICD-10-CM | POA: Diagnosis not present

## 2024-02-16 ENCOUNTER — Other Ambulatory Visit: Payer: Self-pay | Admitting: Internal Medicine

## 2024-02-21 DIAGNOSIS — E291 Testicular hypofunction: Secondary | ICD-10-CM | POA: Diagnosis not present

## 2024-02-21 DIAGNOSIS — R351 Nocturia: Secondary | ICD-10-CM | POA: Diagnosis not present

## 2024-02-21 DIAGNOSIS — N401 Enlarged prostate with lower urinary tract symptoms: Secondary | ICD-10-CM | POA: Diagnosis not present

## 2024-02-23 ENCOUNTER — Other Ambulatory Visit: Payer: Self-pay | Admitting: Internal Medicine

## 2024-02-27 ENCOUNTER — Encounter: Payer: Self-pay | Admitting: Internal Medicine

## 2024-02-27 ENCOUNTER — Non-Acute Institutional Stay: Payer: PPO | Admitting: Internal Medicine

## 2024-02-27 VITALS — BP 120/78 | HR 85 | Temp 97.2°F | Resp 21 | Ht 72.0 in | Wt 184.4 lb

## 2024-02-27 DIAGNOSIS — J302 Other seasonal allergic rhinitis: Secondary | ICD-10-CM | POA: Diagnosis not present

## 2024-02-27 DIAGNOSIS — E7800 Pure hypercholesterolemia, unspecified: Secondary | ICD-10-CM

## 2024-02-27 DIAGNOSIS — R4189 Other symptoms and signs involving cognitive functions and awareness: Secondary | ICD-10-CM | POA: Diagnosis not present

## 2024-02-27 DIAGNOSIS — I10 Essential (primary) hypertension: Secondary | ICD-10-CM | POA: Diagnosis not present

## 2024-02-27 DIAGNOSIS — E349 Endocrine disorder, unspecified: Secondary | ICD-10-CM

## 2024-02-27 DIAGNOSIS — M542 Cervicalgia: Secondary | ICD-10-CM

## 2024-02-27 DIAGNOSIS — Z8572 Personal history of non-Hodgkin lymphomas: Secondary | ICD-10-CM | POA: Diagnosis not present

## 2024-02-27 DIAGNOSIS — G4733 Obstructive sleep apnea (adult) (pediatric): Secondary | ICD-10-CM

## 2024-02-27 DIAGNOSIS — G250 Essential tremor: Secondary | ICD-10-CM

## 2024-02-27 DIAGNOSIS — L299 Pruritus, unspecified: Secondary | ICD-10-CM

## 2024-02-27 NOTE — Progress Notes (Unsigned)
 Location:  [facility] features editor of Service:  Clinic (12)  Provider:   Code Status: *** Goals of Care:     08/29/2023    3:41 PM  Advanced Directives  Does Patient Have a Medical Advance Directive? Yes  Type of Estate agent of [place];Living will;Out of facility DNR (pink MOST or yellow form)  Does patient want to make changes to medical advance directive? No - Patient declined  Copy of Healthcare Power of Attorney in Chart? No - copy requested  Would patient like information on creating a medical advance directive? No - Patient declined     Chief Complaint  Patient presents with   Follow-up    6 month follow up    HPI: Patient is a 83 y.o. male seen today for medical management of chronic diseases.     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   ED (erectile dysfunction)    Elevated diaphragm    right   Erectile dysfunction    History of basal cell carcinoma (BCC) excision    multiple excision in office   History of kidney stones    History of seizure    03-03-2020 per pt x1 while taking interferan for melanoma in 2001 ,  none since, told a side effect of interferan   History of squamous cell carcinoma excision    multiple skin excision in office   Hyperlipidemia    Hypertension    followed by pcp  (03-03-2020 per pt had a stress test approx. 2010, told normal)   LBBB (left bundle branch block)    Lymphoma of testis (HCC) 2000   s/p right orchiectomy    Mass of left testis    MGUS (monoclonal gammopathy of unknown significance)    oncologist--- dr Salomon Cree   Mixed hyperlipidemia 10/26/2020   Personal  history of malignant melanoma of skin dermatologist--- dr Marcia Setters   dx 2001 left calf area  s/p WLE ,  completed one year interferan (03-03-2020 pt states did not involve lymph nodes, no recurrence)   Urgency of urination    Wears hearing aid in both ears     Past Surgical History:  Procedure Laterality Date   CATARACT EXTRACTION W/ INTRAOCULAR LENS IMPLANT Bilateral 2018   IR IMAGING GUIDED PORT INSERTION  03/31/2020   MELANOMA EXCISION  10/1999   ORCHIECTOMY Right 2000   ORCHIECTOMY Left 03/05/2020   Procedure: ORCHIECTOMY;  Surgeon: Homero Luster, MD;  Location: [facility];  Service: Urology;  Laterality: Left;   TONSILLECTOMY  child   TOTAL KNEE ARTHROPLASTY Right 06/2009    Allergies  Allergen Reactions   Latex Itching    Latex tape causes itching and redness.    Outpatient Encounter Medications as of 02/27/2024  Medication Sig   aspirin  81 MG EC tablet Take 1 tablet (81 mg total) by mouth daily. Swallow whole.   carboxymethylcellulose (REFRESH PLUS) 0.5 % SOLN Place 1 drop into both eyes daily as needed.   Cholecalciferol  (D-3-5) 125 MCG (5000 UT) capsule Take 5,000 Units by mouth daily.   clobetasol  (TEMOVATE ) 0.05 % external solution APPLY TO AFFECTED AREA TWICE A DAY  fluticasone  (FLONASE ) 50 MCG/ACT nasal spray SPRAY 2 SPRAYS INTO EACH NOSTRIL EVERY DAY   loratadine  (CLARITIN ) 10 MG tablet Take 1 tablet (10 mg total) by mouth daily as needed for allergies.   losartan  (COZAAR ) 100 MG tablet TAKE 1 TABLET BY MOUTH EVERY DAY   meloxicam  (MOBIC ) 7.5 MG tablet TAKE 1 TABLET BY MOUTH EVERY DAY   Multiple Vitamins-Minerals (PRESERVISION AREDS 2 PO) Take 1 capsule by mouth 2 (two) times daily.    propranolol  (INDERAL ) 10 MG tablet TAKE 1 TABLET BY MOUTH THREE TIMES A DAY   simvastatin  (ZOCOR ) 40 MG tablet TAKE 1 TABLET BY MOUTH EVERY DAY   triamcinolone  cream (KENALOG ) 0.1 % APPLY TO AFFECTED AREA TWICE A DAY   valACYclovir  (VALTREX ) 1000 MG tablet Take 1 tablet  (1,000 mg total) by mouth 2 (two) times daily. Take 1000 (1 grams) PO 2 times daily for 3 days as needed for flares (herpes)   vitamin B-12 (CYANOCOBALAMIN ) 1000 MCG tablet Take 1,000 mcg by mouth daily.   ABRYSVO 120 MCG/0.5ML injection Inject 0.5 mLs into the muscle once. (Patient not taking: Reported on 01/19/2024)   FLUAD 0.5 ML injection Inject 0.5 mLs into the muscle once. (Patient not taking: Reported on 02/27/2024)   SPIKEVAX syringe Inject 0.5 mLs into the muscle once. (Patient not taking: Reported on 02/27/2024)   Testosterone  20.25 MG/ACT (1.62%) GEL Apply 40.5 mg topically daily. (Patient not taking: Reported on 02/27/2024)   [DISCONTINUED] losartan  (COZAAR ) 100 MG tablet TAKE 1 TABLET BY MOUTH EVERY DAY   [DISCONTINUED] meloxicam  (MOBIC ) 7.5 MG tablet TAKE 1 TABLET BY MOUTH EVERY DAY   No facility-administered encounter medications on file as of 02/27/2024.    Review of Systems:  Review of Systems  Health Maintenance  Topic Date Due   Medicare Annual Wellness (AWV)  08/13/2021   COVID-19 Vaccine (8 - Moderna risk 2024-25 season) 08/16/2024 (Originally 12/28/2023)   DTaP/Tdap/Td (2 - Td or Tdap) 03/04/2024   INFLUENZA VACCINE  05/17/2024   Pneumonia Vaccine 16+ Years old  Completed   Zoster Vaccines- Shingrix  Completed   HPV VACCINES  Aged Out   Meningococcal B Vaccine  Aged Out    Physical Exam: Vitals:   02/27/24 1324  BP: 120/78  Pulse: 85  Resp: (!) 21  Temp: (!) 97.2 F (36.2 C)  SpO2: 97%  Weight: 184 lb 6.4 oz (83.6 kg)  Height: 6' (1.829 m)   Body mass index is 25.01 kg/m. Physical Exam  Labs reviewed: Basic Metabolic Panel: Recent Labs    06/09/23 1131 08/22/23 0000 12/15/23 1121  NA 138  --  141  K 4.2  --  4.4  CL 102  --  103  CO2 31  --  32  GLUCOSE 82  --  82  BUN 14  --  17  CREATININE 0.84  --  0.76  CALCIUM 9.2  --  9.5  TSH  --  2.53  --    Liver Function Tests: Recent Labs    06/09/23 1131 12/15/23 1121  AST 23 23  ALT 24 35   ALKPHOS 56 58  BILITOT 1.3* 1.1  PROT 7.2 6.6  ALBUMIN 4.6 4.4   No results for input(s): "LIPASE", "AMYLASE" in the last 8760 hours. No results for input(s): "AMMONIA" in the last 8760 hours. CBC: Recent Labs    06/09/23 1131 12/15/23 1121  WBC 6.4 6.9  NEUTROABS 3.9 4.2  HGB 16.3 15.8  HCT 48.3 48.0  MCV 104.3* 103.4*  PLT 193 197   Lipid Panel: Recent Labs    08/22/23 0000  CHOL 135  HDL 72*  LDLCALC 49  TRIG 68   Lab Results  Component Value Date   HGBA1C 5.7 02/27/2020    Procedures since last visit: No results found.  Assessment/Plan There are no diagnoses linked to this encounter.   Labs/tests ordered:  * No order type specified * Next appt:  Visit date not found

## 2024-03-01 ENCOUNTER — Telehealth: Payer: Self-pay | Admitting: Internal Medicine

## 2024-03-01 NOTE — Telephone Encounter (Signed)
 Copied from CRM [id number]. Topic: Clinical - Medication Refill >> Mar 01, 2024  2:35 PM Danelle Dunning F wrote: Patient called in stating that he would like a refill of the following cream due to having a break out on the back of his neck and arms. Patient has already spoken to Dr. Venice Gillis about the concern but the patient would like either a strong dose or a larger tube of the cream to help with the affected area.    Medication: triamcinolone  cream (KENALOG ) 0.1 %  Has the patient contacted their pharmacy? No   This is the patient's preferred pharmacy:  [id number] [place], [place] - [address] [place] [address] Phone: [phone] Fax: [phone]   Is this the correct pharmacy for this prescription? Yes   Has the prescription been filled recently? No  Is the patient out of the medication? No, he has a little left  Has the patient been seen for an appointment in the last year OR does the patient have an upcoming appointment? Yes  Can we respond through MyChart? Yes  Agent: Please be advised that Rx refills may take up to 3 business days. We ask that you follow-up with your pharmacy.

## 2024-03-04 MED ORDER — TRIAMCINOLONE ACETONIDE 0.1 % EX CREA: TOPICAL_CREAM | Freq: Two times a day (BID) | CUTANEOUS | 1 refills | Status: DC

## 2024-03-04 NOTE — Telephone Encounter (Signed)
Pended Rx and sent to Dr. Gupta for approval.  

## 2024-03-26 ENCOUNTER — Telehealth: Payer: Self-pay

## 2024-03-26 NOTE — Telephone Encounter (Unsigned)
 Copied from CRM [id number]. Topic: Clinical - Medication Question >> Mar 26, 2024  1:33 PM Shelby Dessert H wrote: Reason for CRM: Patient was calling trying to figure out if he should be taking some type of calcium at his age. Patient would like the provider r nurse to call him back at [phone].

## 2024-03-27 NOTE — Telephone Encounter (Signed)
 He can start taking Calcium Citrate 650 mg . Start with one tablet a day You can also get calcium from diet like Yogurt and Milk

## 2024-03-27 NOTE — Telephone Encounter (Signed)
 Left message on voicemail for patient to return call when available, reason for call was to share providers response.   Side Note: response also sent to patient via mychart

## 2024-04-03 DIAGNOSIS — L570 Actinic keratosis: Secondary | ICD-10-CM | POA: Diagnosis not present

## 2024-04-03 DIAGNOSIS — L814 Other melanin hyperpigmentation: Secondary | ICD-10-CM | POA: Diagnosis not present

## 2024-04-03 DIAGNOSIS — L905 Scar conditions and fibrosis of skin: Secondary | ICD-10-CM | POA: Diagnosis not present

## 2024-04-03 DIAGNOSIS — Z85820 Personal history of malignant melanoma of skin: Secondary | ICD-10-CM | POA: Diagnosis not present

## 2024-04-03 DIAGNOSIS — L821 Other seborrheic keratosis: Secondary | ICD-10-CM | POA: Diagnosis not present

## 2024-04-03 DIAGNOSIS — Z85828 Personal history of other malignant neoplasm of skin: Secondary | ICD-10-CM | POA: Diagnosis not present

## 2024-04-03 DIAGNOSIS — C44222 Squamous cell carcinoma of skin of right ear and external auricular canal: Secondary | ICD-10-CM | POA: Diagnosis not present

## 2024-04-03 DIAGNOSIS — D485 Neoplasm of uncertain behavior of skin: Secondary | ICD-10-CM | POA: Diagnosis not present

## 2024-04-08 ENCOUNTER — Telehealth: Payer: Self-pay | Admitting: *Deleted

## 2024-04-08 NOTE — Telephone Encounter (Signed)
 Patient does not need any referral for that

## 2024-04-08 NOTE — Telephone Encounter (Signed)
Tried calling patient. LMOM to return call.  °

## 2024-04-08 NOTE — Telephone Encounter (Signed)
 Copied from CRM [id number]. Topic: Referral - Question >> Apr 08, 2024  2:06 PM Diannia H wrote: Reason for CRM: Patient lost his glass and wanted to know could he be referred out to someone like an optometrists, please call patients back at [phone].

## 2024-04-09 NOTE — Telephone Encounter (Signed)
 Patient notified and agreed.

## 2024-04-24 DIAGNOSIS — H35363 Drusen (degenerative) of macula, bilateral: Secondary | ICD-10-CM | POA: Diagnosis not present

## 2024-06-26 ENCOUNTER — Encounter: Payer: Self-pay | Admitting: Pharmacist

## 2024-06-26 NOTE — Progress Notes (Signed)
   06/26/2024  Patient ID: Antonio [name], male   DOB: 07-May-1941, 83 y.o.   MRN: [id number]  Pharmacy Quality Measure Review  This patient is appearing on a report for being at risk of failing the adherence measure for cholesterol (statin) medications this calendar year.   Medication: Simvastatin  40 mg  Last fill date: 05/29/24 for 90 day supply  Insurance report was not up to date. No action needed at this time.   [name], PharmD, BCACP Clinical Pharmacist [phone]

## 2024-07-08 DIAGNOSIS — G250 Essential tremor: Secondary | ICD-10-CM | POA: Diagnosis not present

## 2024-07-08 DIAGNOSIS — E559 Vitamin D deficiency, unspecified: Secondary | ICD-10-CM | POA: Diagnosis not present

## 2024-07-08 DIAGNOSIS — J309 Allergic rhinitis, unspecified: Secondary | ICD-10-CM | POA: Diagnosis not present

## 2024-07-08 DIAGNOSIS — I10 Essential (primary) hypertension: Secondary | ICD-10-CM | POA: Diagnosis not present

## 2024-07-08 DIAGNOSIS — H9193 Unspecified hearing loss, bilateral: Secondary | ICD-10-CM | POA: Diagnosis not present

## 2024-07-08 DIAGNOSIS — G8929 Other chronic pain: Secondary | ICD-10-CM | POA: Diagnosis not present

## 2024-07-08 DIAGNOSIS — M1990 Unspecified osteoarthritis, unspecified site: Secondary | ICD-10-CM | POA: Diagnosis not present

## 2024-07-08 DIAGNOSIS — E291 Testicular hypofunction: Secondary | ICD-10-CM | POA: Diagnosis not present

## 2024-07-08 DIAGNOSIS — D849 Immunodeficiency, unspecified: Secondary | ICD-10-CM | POA: Diagnosis not present

## 2024-07-08 DIAGNOSIS — Z7982 Long term (current) use of aspirin: Secondary | ICD-10-CM | POA: Diagnosis not present

## 2024-07-08 DIAGNOSIS — H3530 Unspecified macular degeneration: Secondary | ICD-10-CM | POA: Diagnosis not present

## 2024-07-08 DIAGNOSIS — E785 Hyperlipidemia, unspecified: Secondary | ICD-10-CM | POA: Diagnosis not present

## 2024-07-09 ENCOUNTER — Other Ambulatory Visit: Payer: Self-pay

## 2024-07-09 DIAGNOSIS — C83398 Diffuse large b-cell lymphoma of other extranodal and solid organ sites: Secondary | ICD-10-CM

## 2024-07-10 ENCOUNTER — Inpatient Hospital Stay (HOSPITAL_BASED_OUTPATIENT_CLINIC_OR_DEPARTMENT_OTHER): Payer: PPO | Admitting: Hematology

## 2024-07-10 ENCOUNTER — Inpatient Hospital Stay: Payer: PPO | Attending: Hematology

## 2024-07-10 VITALS — BP 161/89 | HR 72 | Temp 97.7°F | Resp 18 | Wt 185.8 lb

## 2024-07-10 DIAGNOSIS — D472 Monoclonal gammopathy: Secondary | ICD-10-CM | POA: Diagnosis not present

## 2024-07-10 DIAGNOSIS — C83398 Diffuse large b-cell lymphoma of other extranodal and solid organ sites: Secondary | ICD-10-CM | POA: Diagnosis not present

## 2024-07-10 LAB — CBC WITH DIFFERENTIAL (CANCER CENTER ONLY)
Abs Immature Granulocytes: 0.03 K/uL (ref 0.00–0.07)
Basophils Absolute: 0.1 K/uL (ref 0.0–0.1)
Basophils Relative: 1 %
Eosinophils Absolute: 0.1 K/uL (ref 0.0–0.5)
Eosinophils Relative: 1 %
HCT: 50.4 % (ref 39.0–52.0)
Hemoglobin: 16.5 g/dL (ref 13.0–17.0)
Immature Granulocytes: 1 %
Lymphocytes Relative: 22 %
Lymphs Abs: 1.5 K/uL (ref 0.7–4.0)
MCH: 33.7 pg (ref 26.0–34.0)
MCHC: 32.7 g/dL (ref 30.0–36.0)
MCV: 103.1 fL — ABNORMAL HIGH (ref 80.0–100.0)
Monocytes Absolute: 0.9 K/uL (ref 0.1–1.0)
Monocytes Relative: 14 %
Neutro Abs: 4 K/uL (ref 1.7–7.7)
Neutrophils Relative %: 61 %
Platelet Count: 200 K/uL (ref 150–400)
RBC: 4.89 MIL/uL (ref 4.22–5.81)
RDW: 14 % (ref 11.5–15.5)
WBC Count: 6.5 K/uL (ref 4.0–10.5)
nRBC: 0 % (ref 0.0–0.2)

## 2024-07-10 LAB — CMP (CANCER CENTER ONLY)
ALT: 38 U/L (ref 0–44)
AST: 29 U/L (ref 15–41)
Albumin: 4.4 g/dL (ref 3.5–5.0)
Alkaline Phosphatase: 55 U/L (ref 38–126)
Anion gap: 3 — ABNORMAL LOW (ref 5–15)
BUN: 14 mg/dL (ref 8–23)
CO2: 36 mmol/L — ABNORMAL HIGH (ref 22–32)
Calcium: 9.1 mg/dL (ref 8.9–10.3)
Chloride: 104 mmol/L (ref 98–111)
Creatinine: 0.88 mg/dL (ref 0.61–1.24)
GFR, Estimated: >60 mL/min (ref >60)
Glucose, Bld: 96 mg/dL (ref 70–99)
Potassium: 4.5 mmol/L (ref 3.5–5.1)
Sodium: 143 mmol/L (ref 135–145)
Total Bilirubin: 1 mg/dL (ref 0.0–1.2)
Total Protein: 6.9 g/dL (ref 6.5–8.1)

## 2024-07-10 LAB — LACTATE DEHYDROGENASE: LDH: 128 U/L (ref 98–192)

## 2024-07-10 NOTE — Progress Notes (Signed)
 HEMATOLOGY ONCOLOGY PROGRESS NOTE  Date of service: 07/10/2024  Patient Care Team: Charlanne Fredia CROME, MD as PCP - General (Internal Medicine)  CHIEF COMPLAINTS/PURPOSE OF CONSULTATION:  Follow-up for continued evaluation and management of recurrent testicular large B-cell lymphoma  SUMMARY OF ONCOLOGIC HISTORY; HISTORY OF PRESENTING ILLNESS: Oncology History  Diffuse large B cell lymphoma (HCC)  03/17/2020 Initial Diagnosis   Diffuse large B cell lymphoma (HCC)   03/25/2020 - 07/13/2020 Chemotherapy   Patient is on Treatment Plan : NON-HODGKIN'S LYMPHOMA R-CEOP Q21D     Testicular malignant neoplasm (HCC)  03/17/2020 Initial Diagnosis   Testicular malignant neoplasm (HCC)   03/25/2020 - 07/13/2020 Chemotherapy   Patient is on Treatment Plan : NON-HODGKIN'S LYMPHOMA R-CEOP Q21D     Antonio [name] is a wonderful 83 y.o. male who has been referred to us  by Dr. Annabella Rase for evaluation and management of Bone lesion of left lower leg. The pt reports that he is doing well overall.    The pt reports that he cannot feel the ovoid finding from his 04/09/18 MRI as noted below. He denies being able to feel anything in his skin as well. He notes that he received a needle injection in his knee prior to this MRI. The pt notes that his knee has lost much cartilage and is considering a knee replacement. He denies any recent injuries to his knee or falls. He has contacted Dr. Alm Angle in surgery and another surgeon as well and will be making an appointment.    The pt notes that he does not feel any differently recently as compared to 6 months to a year ago. He does note that he normally has constipation in the spring, and has continued to have mild constipation. He denies any other concerns or symptoms.    The pt previously saw my colleague Dr. Sandria Gell for primary testicular B-cell lymphoma in 2000. The pt notes that the involvement was limited to only one testicle. He received 6 intrathecal  treatments in his spine, and received 4 cycles of chemotherapy (thinks this was CHOP) and radiation as well. The pt was followed by an oncologist for several years at the [facility]  in [place] after moving from [place] after his lymphoma treatment.    The pt notes that he was first diagnosed with melanoma in October 2000. He noticed a strange spot on his left leg that was surgically resected. The pt's personal notes report a 2.9cm thick, level 4, no LN involvement, high risk stage II, treated with Interferon three times a week for one year. He notes some squamous cell and basal cell involvements as well and sees a dermatologist, Dr. Rolan Molt, regularly.    Of note prior to the patient's visit today, pt has had MRI Left Knee completed on 04/09/18 with results revealing An ovoid, lobulated, 10mm T2 hyperintense focus within the medial subcutaneous far 1.47mm distal to the joint demonstrates demonstrates nonspecific imaging characteristics.    Most recent lab results (03/01/18) of CBC w/diff is as follows: all values are WNL except for RBC at 4.54, MCV at 100.4, MCH at 33.8.    On review of systems, pt reports left knee pain, mild constipation, and denies new or concerning symptoms, noticing any new lumps or bumps, pain along the spine, abdominal pains, problems passing urine, testicular pain/swelling, and any other symptoms.   INTERVAL HISTORY:  Antonio [name] is a 83 y.o. male is here today for continued evaluation and management  of his recurrent testicular large B-cell lymphoma.   he was last seen by me on 12/15/2023; at the time he mentioned experiencing night sweats, polyuria, and neck stiffness.   Today, his BP is elevated, which he attributes to him rushing to this appointment. He reports he is not taking his ASA, is not using his oxygen  regularly unless at 3000+ feet above sea level, and continues to take Meloxicam  7.5 mg daily - otherwise there are no major changes  to his medications. Denies new lumps/bump, difficulty breathing, abdominal pain, unexpected weight loss, bone pain, localized symptoms, headaches, neurological symptoms, or hx of cardiac/stroke sx. The only symptom he does note experiencing is fatigue after exertion, which is understands could be contributed by his age.  He is still being followed regularly by his Dermatology, and reports no new skin lesions.   REVIEW OF SYSTEMS:    10 Point review of systems of done and is negative except as noted above.  . Past Medical History:  Diagnosis Date   Age-related macular degeneration, dry, both eyes    B-cell lymphoma [facility]) oncologist--- dr onesimo   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   ED (erectile dysfunction)    Elevated diaphragm    right   Erectile dysfunction    History of basal cell carcinoma (BCC) excision    multiple excision in office   History of kidney stones    History of seizure    03-03-2020 per pt x1 while taking interferan for melanoma in 2001 ,  none since, told a side effect of interferan   History of squamous cell carcinoma excision    multiple skin excision in office   Hyperlipidemia    Hypertension    followed by pcp  (03-03-2020 per pt had a stress test approx. 2010, told normal)   LBBB (left bundle branch block)    Lymphoma of testis (HCC) 2000   s/p right orchiectomy    Mass of left testis    MGUS (monoclonal gammopathy of unknown significance)    oncologist--- dr onesimo   Mixed hyperlipidemia 10/26/2020   Personal history of malignant melanoma of skin dermatologist--- dr rolan molt   dx 2001 left calf area  s/p WLE ,  completed one year interferan (03-03-2020 pt states did not involve lymph nodes, no recurrence)   Urgency of urination    Wears hearing aid in both ears       Past Surgical History:  Procedure Laterality Date   CATARACT EXTRACTION W/ INTRAOCULAR LENS IMPLANT Bilateral 2018   IR IMAGING GUIDED PORT INSERTION  03/31/2020   MELANOMA EXCISION  10/1999   ORCHIECTOMY Right 2000   ORCHIECTOMY Left 03/05/2020   Procedure: ORCHIECTOMY;  Surgeon: Watt Rush, MD;  Location: [facility];  Service: Urology;  Laterality: Left;   TONSILLECTOMY  child   TOTAL KNEE ARTHROPLASTY Right 06/2009    Social History   Tobacco Use   Smoking status: Never   Smokeless tobacco: Never  Vaping Use   Vaping status: Never Used  Substance Use Topics   Alcohol use: Yes    Alcohol/week: 14.0 standard drinks of alcohol    Types: 14 Glasses of wine per week    Comment: 2 wine daily   Drug use: Never    ALLERGIES:  is allergic to latex.  MEDICATIONS:  Current Outpatient Medications  Medication Sig Dispense Refill   aspirin  81 MG EC tablet Take 1 tablet (81 mg total) by mouth daily. Swallow whole. 30 tablet 12   carboxymethylcellulose (REFRESH PLUS) 0.5 % SOLN Place 1 drop into both eyes daily as needed.     Cholecalciferol  (D-3-5) 125 MCG (5000 UT) capsule Take 5,000 Units by mouth daily.     clobetasol  (TEMOVATE ) 0.05 % external solution APPLY TO AFFECTED AREA TWICE A DAY 50 mL 1   FLUAD 0.5 ML injection Inject 0.5 mLs into the muscle once. (Patient not taking: Reported on 02/27/2024)     fluticasone  (FLONASE ) 50 MCG/ACT nasal spray SPRAY 2 SPRAYS INTO EACH NOSTRIL EVERY DAY 48 mL 4   loratadine  (CLARITIN ) 10 MG tablet Take 1 tablet (10 mg total) by mouth daily as needed for allergies. 30 tablet 11   losartan  (COZAAR ) 100 MG tablet TAKE 1 TABLET BY MOUTH EVERY DAY 90 tablet 1   meloxicam  (MOBIC ) 7.5 MG tablet TAKE 1 TABLET BY MOUTH EVERY DAY 90 tablet 1   Multiple Vitamins-Minerals (PRESERVISION AREDS 2 PO) Take 1 capsule by mouth 2 (two) times daily.      propranolol  (INDERAL ) 10 MG tablet TAKE 1 TABLET BY MOUTH THREE TIMES A DAY 270 tablet 1    simvastatin  (ZOCOR ) 40 MG tablet TAKE 1 TABLET BY MOUTH EVERY DAY 90 tablet 1   Testosterone  20.25 MG/ACT (1.62%) GEL Apply 40.5 mg topically daily.     triamcinolone  cream (KENALOG ) 0.1 % Apply topically 2 (two) times daily. 30 g 1   valACYclovir  (VALTREX ) 1000 MG tablet Take 1 tablet (1,000 mg total) by mouth 2 (two) times daily. Take 1000 (1 grams) PO 2 times daily for 3 days as needed for flares (herpes) 30 tablet 3   vitamin B-12 (CYANOCOBALAMIN ) 1000 MCG tablet Take 1,000 mcg by mouth daily.     No current facility-administered medications for this visit.    PHYSICAL EXAMINATION: ECOG PERFORMANCE STATUS: 1 - Symptomatic but completely ambulatory   Vitals:   07/10/24 1040 07/10/24 1049  BP: (!) 156/103 (!) 161/89  Pulse: 72   Resp: 18   Temp: 97.7 F (36.5 C)   SpO2: 98%     Filed Weights   07/10/24 1040  Weight: 185 lb 12.8 oz (84.3 kg)  Body mass index is 25.2 kg/m.  GENERAL:alert, in no acute distress and comfortable SKIN: no acute rashes, no significant lesions EYES: conjunctiva are pink and non-injected, sclera anicteric OROPHARYNX: MMM, no exudates, no oropharyngeal erythema or ulceration NECK: supple, no JVD LYMPH:  no palpable lymphadenopathy in the cervical, axillary or inguinal regions LUNGS: clear to auscultation b/l with normal respiratory effort HEART: regular rate & rhythm ABDOMEN:  normoactive bowel sounds , non tender, not distended. Extremity: no pedal edema PSYCH: alert & oriented x 3 with fluent speech NEURO: no focal motor/sensory deficits  LABORATORY DATA:   I have reviewed the data as listed     Latest Ref Rng & Units 07/10/2024   10:13 AM 12/15/2023   11:21 AM 06/09/2023   11:31 AM  CBC  WBC 4.0 - 10.5 K/uL 6.5  6.9  6.4   Hemoglobin 13.0 - 17.0 g/dL 83.4  84.1  83.6   Hematocrit 39.0 - 52.0 % 50.4  48.0  48.3   Platelets 150 - 400 K/uL 200  197  193       Latest Ref Rng & Units 07/10/2024   10:13 AM 12/15/2023   11:21 AM 06/09/2023  11:31 AM  CMP  Glucose 70 - 99 mg/dL 96  82  82   BUN 8 - 23 mg/dL 14  17  14    Creatinine 0.61 - 1.24 mg/dL 9.11  9.23  9.15   Sodium 135 - 145 mmol/L 143  141  138   Potassium 3.5 - 5.1 mmol/L 4.5  4.4  4.2   Chloride 98 - 111 mmol/L 104  103  102   CO2 22 - 32 mmol/L 36  32  31   Calcium 8.9 - 10.3 mg/dL 9.1  9.5  9.2   Total Protein 6.5 - 8.1 g/dL 6.9  6.6  7.2   Total Bilirubin 0.0 - 1.2 mg/dL 1.0  1.1  1.3   Alkaline Phos 38 - 126 U/L 55  58  56   AST 15 - 41 U/L 29  23  23    ALT 0 - 44 U/L 38  35  24    Lab Results  Component Value Date   LDH 128 07/10/2024   LDH 134 12/15/2023   Component Ref Range & Units (hover) 6 mo ago (12/15/23) 1 yr ago (06/09/23) 1 yr ago (12/09/22) 2 yr ago (06/06/22) 2 yr ago (02/02/22) 2 yr ago (08/19/21) 4 yr ago (02/26/20)  IgG (Immunoglobin G), Serum 618 615 616 589 Low  581 Low  634 678  IgA 88 86 86 76 77 81 88  IgM (Immunoglobulin M), Srm 412 High  407 High  380 High  373 High  350 High  349 High  798 High  CM  Total Protein ELP 6.4 VC 6.4 VC 6.4 VC 6.3 VC 6.0 VC 6.2 VC 7.0 VC  Albumin SerPl Elph-Mcnc 3.9 VC 4.0 VC 4.3 VC 4.1 VC 3.6 VC 3.8 VC 4.0 VC  Alpha 1 0.2 VC 0.2 VC 0.2 VC 0.2 VC 0.2 VC 0.2 VC 0.2 VC  Alpha2 Glob SerPl Elph-Mcnc 0.6 VC 0.6 VC 0.5 VC 0.5 VC 0.7 VC 0.6 VC 0.7 VC  B-Globulin SerPl Elph-Mcnc 0.8 VC 0.8 VC 0.7 VC 0.8 VC 0.7 VC 0.7 VC 0.9 VC  Gamma Glob SerPl Elph-Mcnc 0.9 VC 0.8 VC 0.7 VC 0.8 VC 0.8 VC 0.8 VC 1.2 VC  M Protein SerPl Elph-Mcnc 0.3 High  VC 0.3 High  VC 0.2 High  VC 0.4 High  VC 0.4 High  VC 0.3 High  VC 0.6 High  VC  Globulin, Total 2.5 VC 2.4 VC 2.1 Low  VC 2.2 VC 2.4 VC 2.4 VC 3.0 VC  Albumin/Glob SerPl 1.6 VC 1.7 VC 2.1 High  VC 1.9 High  VC 1.6 VC 1.6 VC 1.4 VC   03/05/2020 Left Testicular Flow Pathology Report ([id number]):      03/05/2020 FISH Panel:      03/05/2020 Left Testicular Mass Surgical Pathology ([id number]):      03/01/18 CBC w/diff:    RADIOGRAPHIC STUDIES: I have  personally reviewed the radiological images as listed and agreed with the findings in the report.   03/05/20 of Surgical Pathology ([id number]) SURGICAL PATHOLOGY  CASE: [id number]  PATIENT: Antonio [name]  Surgical Pathology Report  Clinical History: Left testicular mass (jmc)  FINAL MICROSCOPIC DIAGNOSIS:  A. TESTICLE AND CORD, LEFT, ORCHIECTOMY:  - Diffuse large B-cell lymphoma  -See comment  COMMENT:  The sections of the testis and spermatic cord nodules show effacement of  the architecture by an atypical lymphoid infiltrate characterized by  predominance of medium and large lymphoid cells with vesicular chromatin  and small nucleoli associated with  brisk mitosis.  The appearance is  primarily diffuse with lack of atypical follicles.  Variable number of  admixed smaller lymphocytes are seen. To further evaluate this process,  flow cytometric analysis was performed ([id number]) and shows a  monoclonal kappa-restricted B-cell population.  In addition, a battery  of immunohistochemical stains was performed and shows that the atypical  lymphoid cells are positive for CD20, CD79a, PAX 5, CD10 (weak), BCL-2,  MUM-1 and cytoplasmic kappa.  No significant staining is seen with  CD138, cyclin D1, CD30, CD34, TdT, EBV in situ hybridization or  cytoplasmic lambda.  Ki67 shows variable expression ranging from 10% to  >50%.  There is an admixed T-cell population to a lesser extent as seen  with CD3 and CD5 and there is no apparent co-expression of CD5 in B-cell  areas.  The overall features are consistent with involvement by diffuse  large B-cell lymphoma, GCB type.  The results were discussed with Dr.  Watt and Onesimo on 03/11/2020.      ASSESSMENT & PLAN:  83 y.o. male with   H/o Rt Primary testicular large B cell lymphoma in 2000 treatment with R-CHOP x 6 cycles as part of clinical trial + IT MTX x 4 cycles and RT to left testicle   2 . H/o Relapsed Left Primary testicular large B  cell lymphoma 2021 stage I/IIE. Appears to be a new event since it is occurring 20 yrs after his previous rt testicular lymphoma S/p 6 cycles of R-CEOP -- currently in remission.   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  No longer using O2 while at home, only when traveling 3K above sea level.   PLAN: -Discussed lab results on 07/10/2024 in detail with patient. CBC showed WBC of 6.5K, hemoglobin of 16.5, and platelets of 200K.  -LDH normal -CMP normal -Patient has no lab or clinical evidence of recurrence of his primary testicular large B-cell lymphoma at this time. -continue to follow with pulmonology regularly for optimal evaluation and management -continue to follow regularly with dermatology -answered all of patient's questions in detail - Informed patient of the risks of long-term use of Testosterone  or Meloxicam , especially correlating with his ASA discontinuation.  - Encouraged discussing with his PCP/Specialist regarding medications (ASA, Testosterone , Meloxicam ).   FOLLOW-UP: RTC with Dr Onesimo with labs in 1 year  The total time spent in the appointment was 25 minutes*.  All of the patient's questions were answered with apparent satisfaction. The patient knows to call the clinic with any problems, questions or concerns.   Emaline Onesimo MD MS AAHIVMS [facility] Hematology/Oncology Physician [facility]  .*Total Encounter Time as defined by the Centers for Medicare and Medicaid Services includes, in addition to the face-to-face time of a patient visit (documented in the note above) non-face-to-face time: obtaining and reviewing outside history, ordering and reviewing medications, tests or procedures, care coordination (communications with other  health care professionals or caregivers) and documentation in the medical record.   I,Emily Lagle,acting as a Neurosurgeon for Emaline Onesimo, MD.,have documented all relevant documentation on the behalf of Emaline Onesimo, MD,as directed by  Emaline Onesimo, MD while in the presence of Emaline Onesimo, MD.   .I have reviewed the above documentation for accuracy and completeness, and  I agree with the above.. Dalani Mette Kishore Fleda Pagel MD

## 2024-07-11 NOTE — Progress Notes (Incomplete)
 HEMATOLOGY ONCOLOGY PROGRESS NOTE  Date of service: 07/10/2024  Patient Care Team: Charlanne Fredia CROME, MD as PCP - General (Internal Medicine)  CHIEF COMPLAINTS/PURPOSE OF CONSULTATION:  Follow-up for continued evaluation and management of recurrent testicular large B-cell lymphoma  SUMMARY OF ONCOLOGIC HISTORY; HISTORY OF PRESENTING ILLNESS: Oncology History  Diffuse large B cell lymphoma (HCC)  03/17/2020 Initial Diagnosis   Diffuse large B cell lymphoma (HCC)   03/25/2020 - 07/13/2020 Chemotherapy   Patient is on Treatment Plan : NON-HODGKIN'S LYMPHOMA R-CEOP Q21D     Testicular malignant neoplasm (HCC)  03/17/2020 Initial Diagnosis   Testicular malignant neoplasm (HCC)   03/25/2020 - 07/13/2020 Chemotherapy   Patient is on Treatment Plan : NON-HODGKIN'S LYMPHOMA R-CEOP Q21D     Antonio [name] is a wonderful 83 y.o. male who has been referred to us  by Dr. Annabella Rase for evaluation and management of Bone lesion of left lower leg. The pt reports that he is doing well overall.    The pt reports that he cannot feel the ovoid finding from his 04/09/18 MRI as noted below. He denies being able to feel anything in his skin as well. He notes that he received a needle injection in his knee prior to this MRI. The pt notes that his knee has lost much cartilage and is considering a knee replacement. He denies any recent injuries to his knee or falls. He has contacted Dr. Alm Angle in surgery and another surgeon as well and will be making an appointment.    The pt notes that he does not feel any differently recently as compared to 6 months to a year ago. He does note that he normally has constipation in the spring, and has continued to have mild constipation. He denies any other concerns or symptoms.    The pt previously saw my colleague Dr. Sandria Gell for primary testicular B-cell lymphoma in 2000. The pt notes that the involvement was limited to only one testicle. He received 6 intrathecal  treatments in his spine, and received 4 cycles of chemotherapy (thinks this was CHOP) and radiation as well. The pt was followed by an oncologist for several years at the [facility] [place]  in [place] after moving from [place] after his lymphoma treatment.    The pt notes that he was first diagnosed with melanoma in October 2000. He noticed a strange spot on his left leg that was surgically resected. The pt's personal notes report a 2.9cm thick, level 4, no LN involvement, high risk stage II, treated with Interferon three times a week for one year. He notes some squamous cell and basal cell involvements as well and sees a dermatologist, Dr. Rolan Molt, regularly.    Of note prior to the patient's visit today, pt has had MRI Left Knee completed on 04/09/18 with results revealing An ovoid, lobulated, 10mm T2 hyperintense focus within the medial subcutaneous far 1.65mm distal to the joint demonstrates demonstrates nonspecific imaging characteristics.    Most recent lab results (03/01/18) of CBC w/diff is as follows: all values are WNL except for RBC at 4.54, MCV at 100.4, MCH at 33.8.    On review of systems, pt reports left knee pain, mild constipation, and denies new or concerning symptoms, noticing any new lumps or bumps, pain along the spine, abdominal pains, problems passing urine, testicular pain/swelling, and any other symptoms.   INTERVAL HISTORY: Antonio [name] is a 83 y.o. male here today for continued evaluation and management of his  recurrent testicular large B-cell lymphoma.   he was last seen by me on 12/15/2023; at the time he mentioned experiencing night sweats, polyuria, and neck stiffness.   Today,            Latest Ref Rng & Units 07/10/2024 12/15/2023 06/09/2023  CBC EXTENDED  WBC 4.0 - 10.5 K/uL 6.5  6.9  6.4   RBC 4.22 - 5.81 MIL/uL 4.89  4.64  4.63   Hemoglobin 13.0 - 17.0 g/dL 83.4  84.1  83.6   HCT 39.0 - 52.0 % 50.4  48.0  48.3   Platelets 150 - 400  K/uL 200  197  193   NEUT# 1.7 - 7.7 K/uL 4.0  4.2  3.9   Lymph# 0.7 - 4.0 K/uL 1.5  1.6  1.5          BP elevated, attributed to him rushing to his appointment   Denies headaches, neurological symptoms,  No using oxi concentrator, unless into the mnts above 3K feet Fatigue after exertion  Not taking ASA Denies hx of cardiac/stroke sx   Continuing to take Meloxicam  for Neck Stiffness Educated on possible risk of long-term use. Encouraged discussion with PCP on his medications (Testosterone , ASA, Meloxicam )  No other major med changes  New lumps/bump breathing belly pain unexpect wt loss, bone pain, localized symptoms  Regularly seeing Derm, no new skin lesions   Testosterone  - Urologist - Dr. Norleen Seltzer  REVIEW OF SYSTEMS:    10 Point review of systems of done and is negative except as noted above.  . Past Medical History:  Diagnosis Date  . Age-related macular degeneration, dry, both eyes   . B-cell lymphoma [facility]) oncologist--- dr onesimo   dx 2000 primary right testicular diffuse large b-cell lymphoma;  s/p  right orchiectomy 2000, completed chemo, intrathcal chemo (spine injection's), and radiation to left testis in 2000  . Carotid stenosis, asymptomatic, left ?  . ED (erectile dysfunction)   . Elevated diaphragm    right  . Erectile dysfunction   . History of basal cell carcinoma (BCC) excision    multiple excision in office  . History of kidney stones   . History of seizure    03-03-2020 per pt x1 while taking interferan for melanoma in 2001 ,  none since, told a side effect of interferan  . History of squamous cell carcinoma excision    multiple skin excision in office  . Hyperlipidemia   . Hypertension    followed by pcp  (03-03-2020 per pt had a stress test approx. 2010, told normal)  . LBBB (left bundle branch block)   . Lymphoma of testis (HCC) 2000   s/p right orchiectomy   . Mass of left testis   . MGUS (monoclonal gammopathy of unknown  significance)    oncologist--- dr onesimo  . Mixed hyperlipidemia 10/26/2020  . Personal history of malignant melanoma of skin dermatologist--- dr rolan molt   dx 2001 left calf area  s/p WLE ,  completed one year interferan (03-03-2020 pt states did not involve lymph nodes, no recurrence)  . Urgency of urination   . Wears hearing aid in both ears      Past Surgical History:  Procedure Laterality Date  . CATARACT EXTRACTION W/ INTRAOCULAR LENS IMPLANT Bilateral 2018  . IR IMAGING GUIDED PORT INSERTION  03/31/2020  . MELANOMA EXCISION  10/1999  . ORCHIECTOMY Right 2000  . ORCHIECTOMY Left 03/05/2020   Procedure: ORCHIECTOMY;  Surgeon: Seltzer Norleen, MD;  Location:  [facility];  Service: Urology;  Laterality: Left;  . TONSILLECTOMY  child  . TOTAL KNEE ARTHROPLASTY Right 06/2009    Social History   Tobacco Use  . Smoking status: Never  . Smokeless tobacco: Never  Vaping Use  . Vaping status: Never Used  Substance Use Topics  . Alcohol use: Yes    Alcohol/week: 14.0 standard drinks of alcohol    Types: 14 Glasses of wine per week    Comment: 2 wine daily  . Drug use: Never    ALLERGIES:  is allergic to latex.  MEDICATIONS:  Current Outpatient Medications  Medication Sig Dispense Refill  . aspirin  81 MG EC tablet Take 1 tablet (81 mg total) by mouth daily. Swallow whole. 30 tablet 12  . carboxymethylcellulose (REFRESH PLUS) 0.5 % SOLN Place 1 drop into both eyes daily as needed.    . Cholecalciferol  (D-3-5) 125 MCG (5000 UT) capsule Take 5,000 Units by mouth daily.    . clobetasol  (TEMOVATE ) 0.05 % external solution APPLY TO AFFECTED AREA TWICE A DAY 50 mL 1  . FLUAD 0.5 ML injection Inject 0.5 mLs into the muscle once. (Patient not taking: Reported on 02/27/2024)    . fluticasone  (FLONASE ) 50 MCG/ACT nasal spray SPRAY 2 SPRAYS INTO EACH NOSTRIL EVERY DAY 48 mL 4  . loratadine  (CLARITIN ) 10 MG tablet Take 1 tablet (10 mg total) by mouth daily as needed for allergies.  30 tablet 11  . losartan  (COZAAR ) 100 MG tablet TAKE 1 TABLET BY MOUTH EVERY DAY 90 tablet 1  . meloxicam  (MOBIC ) 7.5 MG tablet TAKE 1 TABLET BY MOUTH EVERY DAY 90 tablet 1  . Multiple Vitamins-Minerals (PRESERVISION AREDS 2 PO) Take 1 capsule by mouth 2 (two) times daily.     . propranolol  (INDERAL ) 10 MG tablet TAKE 1 TABLET BY MOUTH THREE TIMES A DAY 270 tablet 1  . simvastatin  (ZOCOR ) 40 MG tablet TAKE 1 TABLET BY MOUTH EVERY DAY 90 tablet 1  . Testosterone  20.25 MG/ACT (1.62%) GEL Apply 40.5 mg topically daily.    . triamcinolone  cream (KENALOG ) 0.1 % Apply topically 2 (two) times daily. 30 g 1  . valACYclovir  (VALTREX ) 1000 MG tablet Take 1 tablet (1,000 mg total) by mouth 2 (two) times daily. Take 1000 (1 grams) PO 2 times daily for 3 days as needed for flares (herpes) 30 tablet 3  . vitamin B-12 (CYANOCOBALAMIN ) 1000 MCG tablet Take 1,000 mcg by mouth daily.     No current facility-administered medications for this visit.    PHYSICAL EXAMINATION: ECOG PERFORMANCE STATUS: 1 - Symptomatic but completely ambulatory   Vitals:   07/10/24 1040 07/10/24 1049  BP: (!) 156/103 (!) 161/89  Pulse: 72   Resp: 18   Temp: 97.7 F (36.5 C)   SpO2: 98%     Filed Weights   07/10/24 1040  Weight: 185 lb 12.8 oz (84.3 kg)  Body mass index is 25.2 kg/m.  GENERAL:alert, in no acute distress and comfortable SKIN: no acute rashes, no significant lesions EYES: conjunctiva are pink and non-injected, sclera anicteric OROPHARYNX: MMM, no exudates, no oropharyngeal erythema or ulceration NECK: supple, no JVD LYMPH:  no palpable lymphadenopathy in the cervical, axillary or inguinal regions LUNGS: clear to auscultation b/l with normal respiratory effort HEART: regular rate & rhythm ABDOMEN:  normoactive bowel sounds , non tender, not distended. Extremity: no pedal edema PSYCH: alert & oriented x 3 with fluent speech NEURO: no focal motor/sensory deficits  LABORATORY DATA:   I  have reviewed  the data as listed     Latest Ref Rng & Units 07/10/2024   10:13 AM 12/15/2023   11:21 AM 06/09/2023   11:31 AM  CBC  WBC 4.0 - 10.5 K/uL 6.5  6.9  6.4   Hemoglobin 13.0 - 17.0 g/dL 83.4  84.1  83.6   Hematocrit 39.0 - 52.0 % 50.4  48.0  48.3   Platelets 150 - 400 K/uL 200  197  193        Latest Ref Rng & Units 12/15/2023   11:21 AM 06/09/2023   11:31 AM 12/09/2022    1:02 PM  CMP  Glucose 70 - 99 mg/dL 82  82  867   BUN 8 - 23 mg/dL 17  14  18    Creatinine 0.61 - 1.24 mg/dL 9.23  9.15  9.13   Sodium 135 - 145 mmol/L 141  138  139   Potassium 3.5 - 5.1 mmol/L 4.4  4.2  4.5   Chloride 98 - 111 mmol/L 103  102  102   CO2 22 - 32 mmol/L 32  31  32   Calcium 8.9 - 10.3 mg/dL 9.5  9.2  8.8   Total Protein 6.5 - 8.1 g/dL 6.6  7.2  6.8   Total Bilirubin 0.0 - 1.2 mg/dL 1.1  1.3  1.0   Alkaline Phos 38 - 126 U/L 58  56  51   AST 15 - 41 U/L 23  23  19    ALT 0 - 44 U/L 35  24  24    Component Ref Range & Units (hover) 6 mo ago (12/15/23) 1 yr ago (06/09/23) 1 yr ago (12/09/22) 2 yr ago (06/06/22) 2 yr ago (02/02/22) 2 yr ago (08/19/21) 4 yr ago (02/26/20)  IgG (Immunoglobin G), Serum 618 615 616 589 Low  581 Low  634 678  IgA 88 86 86 76 77 81 88  IgM (Immunoglobulin M), Srm 412 High  407 High  380 High  373 High  350 High  349 High  798 High  CM  Total Protein ELP 6.4 VC 6.4 VC 6.4 VC 6.3 VC 6.0 VC 6.2 VC 7.0 VC  Albumin SerPl Elph-Mcnc 3.9 VC 4.0 VC 4.3 VC 4.1 VC 3.6 VC 3.8 VC 4.0 VC  Alpha 1 0.2 VC 0.2 VC 0.2 VC 0.2 VC 0.2 VC 0.2 VC 0.2 VC  Alpha2 Glob SerPl Elph-Mcnc 0.6 VC 0.6 VC 0.5 VC 0.5 VC 0.7 VC 0.6 VC 0.7 VC  B-Globulin SerPl Elph-Mcnc 0.8 VC 0.8 VC 0.7 VC 0.8 VC 0.7 VC 0.7 VC 0.9 VC  Gamma Glob SerPl Elph-Mcnc 0.9 VC 0.8 VC 0.7 VC 0.8 VC 0.8 VC 0.8 VC 1.2 VC  M Protein SerPl Elph-Mcnc 0.3 High  VC 0.3 High  VC 0.2 High  VC 0.4 High  VC 0.4 High  VC 0.3 High  VC 0.6 High  VC  Globulin, Total 2.5 VC 2.4 VC 2.1 Low  VC 2.2 VC 2.4 VC 2.4 VC 3.0 VC  Albumin/Glob SerPl 1.6 VC  1.7 VC 2.1 High  VC 1.9 High  VC 1.6 VC 1.6 VC 1.4 VC  IFE 1 Comment Abnormal  VC Comment Abnormal  VC, CM Comment Abnormal  VC, CM Comment Abnormal  VC, CM Comment Abnormal  VC, CM Comment Abnormal  VC, CM Comment Abnormal  VC, CM    RADIOGRAPHIC STUDIES: I have personally reviewed the radiological images as listed and agreed with the findings in the report. No  results found.  ASSESSMENT & PLAN:  ***  I spent {CHL ONC TIME VISIT - DTPQU:[id number]} counseling the patient face to face. The total time spent in the appointment was {CHL ONC TIME VISIT - DTPQU:[id number]} and more than 50% was on counseling and direct patient cares.    Emaline Saran MD MS AAHIVMS [facility] Hematology/Oncology Physician [facility]  (Office):       [phone] (Work cell):  [phone] (Fax):           [phone]  I,Emily Lagle,acting as a scribe for Emaline Saran, MD.,have documented all relevant documentation on the behalf of Emaline Saran, MD,as directed by  Emaline Saran, MD while in the presence of Emaline Saran, MD.

## 2024-07-13 LAB — MULTIPLE MYELOMA PANEL, SERUM
Albumin SerPl Elph-Mcnc: 3.7 g/dL (ref 2.9–4.4)
Albumin/Glob SerPl: 1.4 (ref 0.7–1.7)
Alpha 1: 0.3 g/dL (ref 0.0–0.4)
Alpha2 Glob SerPl Elph-Mcnc: 0.7 g/dL (ref 0.4–1.0)
B-Globulin SerPl Elph-Mcnc: 0.8 g/dL (ref 0.7–1.3)
Gamma Glob SerPl Elph-Mcnc: 1 g/dL (ref 0.4–1.8)
Globulin, Total: 2.7 g/dL (ref 2.2–3.9)
IgA: 181 mg/dL (ref 61–437)
IgG (Immunoglobin G), Serum: 699 mg/dL (ref 603–1613)
IgM (Immunoglobulin M), Srm: 438 mg/dL — ABNORMAL HIGH (ref 15–143)
M Protein SerPl Elph-Mcnc: 0.4 g/dL — ABNORMAL HIGH
Total Protein ELP: 6.4 g/dL (ref 6.0–8.5)

## 2024-07-16 NOTE — Progress Notes (Incomplete)
 HEMATOLOGY ONCOLOGY PROGRESS NOTE  Date of service: 07/10/2024  Patient Care Team: Charlanne Fredia CROME, MD as PCP - General (Internal Medicine)  CHIEF COMPLAINTS/PURPOSE OF CONSULTATION:  Follow-up for continued evaluation and management of recurrent testicular large B-cell lymphoma  SUMMARY OF ONCOLOGIC HISTORY; HISTORY OF PRESENTING ILLNESS: Oncology History  Diffuse large B cell lymphoma (HCC)  03/17/2020 Initial Diagnosis   Diffuse large B cell lymphoma (HCC)   03/25/2020 - 07/13/2020 Chemotherapy   Patient is on Treatment Plan : NON-HODGKIN'S LYMPHOMA R-CEOP Q21D     Testicular malignant neoplasm (HCC)  03/17/2020 Initial Diagnosis   Testicular malignant neoplasm (HCC)   03/25/2020 - 07/13/2020 Chemotherapy   Patient is on Treatment Plan : NON-HODGKIN'S LYMPHOMA R-CEOP Q21D     Antonio [name] is a wonderful 83 y.o. male who has been referred to us  by Dr. Annabella Rase for evaluation and management of Bone lesion of left lower leg. The pt reports that he is doing well overall.    The pt reports that he cannot feel the ovoid finding from his 04/09/18 MRI as noted below. He denies being able to feel anything in his skin as well. He notes that he received a needle injection in his knee prior to this MRI. The pt notes that his knee has lost much cartilage and is considering a knee replacement. He denies any recent injuries to his knee or falls. He has contacted Dr. Alm Angle in surgery and another surgeon as well and will be making an appointment.    The pt notes that he does not feel any differently recently as compared to 6 months to a year ago. He does note that he normally has constipation in the spring, and has continued to have mild constipation. He denies any other concerns or symptoms.    The pt previously saw my colleague Dr. Sandria Gell for primary testicular B-cell lymphoma in 2000. The pt notes that the involvement was limited to only one testicle. He received 6 intrathecal  treatments in his spine, and received 4 cycles of chemotherapy (thinks this was CHOP) and radiation as well. The pt was followed by an oncologist for several years at the [facility]  in [place] after moving from [place] after his lymphoma treatment.    The pt notes that he was first diagnosed with melanoma in October 2000. He noticed a strange spot on his left leg that was surgically resected. The pt's personal notes report a 2.9cm thick, level 4, no LN involvement, high risk stage II, treated with Interferon three times a week for one year. He notes some squamous cell and basal cell involvements as well and sees a dermatologist, Dr. Rolan Molt, regularly.    Of note prior to the patient's visit today, pt has had MRI Left Knee completed on 04/09/18 with results revealing An ovoid, lobulated, 10mm T2 hyperintense focus within the medial subcutaneous far 1.21mm distal to the joint demonstrates demonstrates nonspecific imaging characteristics.    Most recent lab results (03/01/18) of CBC w/diff is as follows: all values are WNL except for RBC at 4.54, MCV at 100.4, MCH at 33.8.    On review of systems, pt reports left knee pain, mild constipation, and denies new or concerning symptoms, noticing any new lumps or bumps, pain along the spine, abdominal pains, problems passing urine, testicular pain/swelling, and any other symptoms.   INTERVAL HISTORY: Antonio [name] is a 83 y.o. male here today for continued evaluation and management of his  recurrent testicular large B-cell lymphoma.   he was last seen by me on 12/15/2023; at the time he mentioned experiencing night sweats, polyuria, and neck stiffness.   Today, his BP is elevated, which he attributes to him rushing to this appointment. He reports he is not taking his ASA, is not using his oxygen  regularly unless at 3000+ feet above sea level, and continues to take Meloxicam  7.5 mg daily - otherwise there are no major changes to his  medications. Denies new lumps/bump, difficulty breathing, abdominal pain, unexpected weight loss, bone pain, localized symptoms, headaches, neurological symptoms, or hx of cardiac/stroke sx. The only symptom he does note experiencing is fatigue after exertion, which is understands could be contributed by his age.  He is still being followed regularly by his Dermatology, and reports no new skin lesions.   REVIEW OF SYSTEMS:    10 Point review of systems of done and is negative except as noted above.  . Past Medical History:  Diagnosis Date   Age-related macular degeneration, dry, both eyes    B-cell lymphoma [facility]) oncologist--- dr onesimo   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   ED (erectile dysfunction)    Elevated diaphragm    right   Erectile dysfunction    History of basal cell carcinoma (BCC) excision    multiple excision in office   History of kidney stones    History of seizure    03-03-2020 per pt x1 while taking interferan for melanoma in 2001 ,  none since, told a side effect of interferan   History of squamous cell carcinoma excision    multiple skin excision in office   Hyperlipidemia    Hypertension    followed by pcp  (03-03-2020 per pt had a stress test approx. 2010, told normal)   LBBB (left bundle branch block)    Lymphoma of testis (HCC) 2000   s/p right orchiectomy    Mass of left testis    MGUS (monoclonal gammopathy of unknown significance)    oncologist--- dr onesimo   Mixed hyperlipidemia 10/26/2020   Personal history of malignant melanoma of skin dermatologist--- dr rolan molt   dx 2001 left calf area  s/p WLE ,  completed one year interferan (03-03-2020 pt states did not involve lymph nodes, no recurrence)   Urgency of urination    Wears hearing aid in both ears       Past Surgical History:  Procedure Laterality Date   CATARACT EXTRACTION W/ INTRAOCULAR LENS IMPLANT Bilateral 2018   IR IMAGING GUIDED PORT INSERTION  03/31/2020   MELANOMA EXCISION  10/1999   ORCHIECTOMY Right 2000   ORCHIECTOMY Left 03/05/2020   Procedure: ORCHIECTOMY;  Surgeon: Watt Rush, MD;  Location: [facility];  Service: Urology;  Laterality: Left;   TONSILLECTOMY  child   TOTAL KNEE ARTHROPLASTY Right 06/2009    Social History   Tobacco Use   Smoking status: Never   Smokeless tobacco: Never  Vaping Use   Vaping status: Never Used  Substance Use Topics   Alcohol use: Yes    Alcohol/week: 14.0 standard drinks of alcohol    Types: 14 Glasses of wine per week    Comment: 2 wine daily   Drug use: Never    ALLERGIES:  is allergic to latex.  MEDICATIONS:  Current Outpatient  Medications  Medication Sig Dispense Refill   aspirin  81 MG EC tablet Take 1 tablet (81 mg total) by mouth daily. Swallow whole. 30 tablet 12   carboxymethylcellulose (REFRESH PLUS) 0.5 % SOLN Place 1 drop into both eyes daily as needed.     Cholecalciferol  (D-3-5) 125 MCG (5000 UT) capsule Take 5,000 Units by mouth daily.     clobetasol  (TEMOVATE ) 0.05 % external solution APPLY TO AFFECTED AREA TWICE A DAY 50 mL 1   FLUAD 0.5 ML injection Inject 0.5 mLs into the muscle once. (Patient not taking: Reported on 02/27/2024)     fluticasone  (FLONASE ) 50 MCG/ACT nasal spray SPRAY 2 SPRAYS INTO EACH NOSTRIL EVERY DAY 48 mL 4   loratadine  (CLARITIN ) 10 MG tablet Take 1 tablet (10 mg total) by mouth daily as needed for allergies. 30 tablet 11   losartan  (COZAAR ) 100 MG tablet TAKE 1 TABLET BY MOUTH EVERY DAY 90 tablet 1   meloxicam  (MOBIC ) 7.5 MG tablet TAKE 1 TABLET BY MOUTH EVERY DAY 90 tablet 1   Multiple Vitamins-Minerals (PRESERVISION AREDS 2 PO) Take 1 capsule by mouth 2 (two) times daily.      propranolol  (INDERAL ) 10 MG tablet TAKE 1 TABLET BY MOUTH THREE TIMES A DAY 270 tablet 1    simvastatin  (ZOCOR ) 40 MG tablet TAKE 1 TABLET BY MOUTH EVERY DAY 90 tablet 1   Testosterone  20.25 MG/ACT (1.62%) GEL Apply 40.5 mg topically daily.     triamcinolone  cream (KENALOG ) 0.1 % Apply topically 2 (two) times daily. 30 g 1   valACYclovir  (VALTREX ) 1000 MG tablet Take 1 tablet (1,000 mg total) by mouth 2 (two) times daily. Take 1000 (1 grams) PO 2 times daily for 3 days as needed for flares (herpes) 30 tablet 3   vitamin B-12 (CYANOCOBALAMIN ) 1000 MCG tablet Take 1,000 mcg by mouth daily.     No current facility-administered medications for this visit.    PHYSICAL EXAMINATION: ECOG PERFORMANCE STATUS: 1 - Symptomatic but completely ambulatory   Vitals:   07/10/24 1040 07/10/24 1049  BP: (!) 156/103 (!) 161/89  Pulse: 72   Resp: 18   Temp: 97.7 F (36.5 C)   SpO2: 98%     Filed Weights   07/10/24 1040  Weight: 185 lb 12.8 oz (84.3 kg)  Body mass index is 25.2 kg/m.  GENERAL:alert, in no acute distress and comfortable SKIN: no acute rashes, no significant lesions EYES: conjunctiva are pink and non-injected, sclera anicteric OROPHARYNX: MMM, no exudates, no oropharyngeal erythema or ulceration NECK: supple, no JVD LYMPH:  no palpable lymphadenopathy in the cervical, axillary or inguinal regions LUNGS: clear to auscultation b/l with normal respiratory effort HEART: regular rate & rhythm ABDOMEN:  normoactive bowel sounds , non tender, not distended. Extremity: no pedal edema PSYCH: alert & oriented x 3 with fluent speech NEURO: no focal motor/sensory deficits  LABORATORY DATA:   I have reviewed the data as listed     Latest Ref Rng & Units 07/10/2024   10:13 AM 12/15/2023   11:21 AM 06/09/2023   11:31 AM  CBC  WBC 4.0 - 10.5 K/uL 6.5  6.9  6.4   Hemoglobin 13.0 - 17.0 g/dL 83.4  84.1  83.6   Hematocrit 39.0 - 52.0 % 50.4  48.0  48.3   Platelets 150 - 400 K/uL 200  197  193       Latest Ref Rng & Units 07/10/2024   10:13 AM 12/15/2023   11:21 AM 06/09/2023  11:31 AM  CMP  Glucose 70 - 99 mg/dL 96  82  82   BUN 8 - 23 mg/dL 14  17  14    Creatinine 0.61 - 1.24 mg/dL 9.11  9.23  9.15   Sodium 135 - 145 mmol/L 143  141  138   Potassium 3.5 - 5.1 mmol/L 4.5  4.4  4.2   Chloride 98 - 111 mmol/L 104  103  102   CO2 22 - 32 mmol/L 36  32  31   Calcium 8.9 - 10.3 mg/dL 9.1  9.5  9.2   Total Protein 6.5 - 8.1 g/dL 6.9  6.6  7.2   Total Bilirubin 0.0 - 1.2 mg/dL 1.0  1.1  1.3   Alkaline Phos 38 - 126 U/L 55  58  56   AST 15 - 41 U/L 29  23  23    ALT 0 - 44 U/L 38  35  24    Lab Results  Component Value Date   LDH 128 07/10/2024   LDH 134 12/15/2023   Component Ref Range & Units (hover) 6 mo ago (12/15/23) 1 yr ago (06/09/23) 1 yr ago (12/09/22) 2 yr ago (06/06/22) 2 yr ago (02/02/22) 2 yr ago (08/19/21) 4 yr ago (02/26/20)  IgG (Immunoglobin G), Serum 618 615 616 589 Low  581 Low  634 678  IgA 88 86 86 76 77 81 88  IgM (Immunoglobulin M), Srm 412 High  407 High  380 High  373 High  350 High  349 High  798 High  CM  Total Protein ELP 6.4 VC 6.4 VC 6.4 VC 6.3 VC 6.0 VC 6.2 VC 7.0 VC  Albumin SerPl Elph-Mcnc 3.9 VC 4.0 VC 4.3 VC 4.1 VC 3.6 VC 3.8 VC 4.0 VC  Alpha 1 0.2 VC 0.2 VC 0.2 VC 0.2 VC 0.2 VC 0.2 VC 0.2 VC  Alpha2 Glob SerPl Elph-Mcnc 0.6 VC 0.6 VC 0.5 VC 0.5 VC 0.7 VC 0.6 VC 0.7 VC  B-Globulin SerPl Elph-Mcnc 0.8 VC 0.8 VC 0.7 VC 0.8 VC 0.7 VC 0.7 VC 0.9 VC  Gamma Glob SerPl Elph-Mcnc 0.9 VC 0.8 VC 0.7 VC 0.8 VC 0.8 VC 0.8 VC 1.2 VC  M Protein SerPl Elph-Mcnc 0.3 High  VC 0.3 High  VC 0.2 High  VC 0.4 High  VC 0.4 High  VC 0.3 High  VC 0.6 High  VC  Globulin, Total 2.5 VC 2.4 VC 2.1 Low  VC 2.2 VC 2.4 VC 2.4 VC 3.0 VC  Albumin/Glob SerPl 1.6 VC 1.7 VC 2.1 High  VC 1.9 High  VC 1.6 VC 1.6 VC 1.4 VC   03/05/2020 Left Testicular Flow Pathology Report ([id number]):      03/05/2020 FISH Panel:      03/05/2020 Left Testicular Mass Surgical Pathology ([id number]):      03/01/18 CBC w/diff:    RADIOGRAPHIC STUDIES: I have  personally reviewed the radiological images as listed and agreed with the findings in the report.   03/05/20 of Surgical Pathology ([id number]) SURGICAL PATHOLOGY  CASE: [id number]  PATIENT: ELSIE ROBERT  Surgical Pathology Report  Clinical History: Left testicular mass (jmc)  FINAL MICROSCOPIC DIAGNOSIS:  A. TESTICLE AND CORD, LEFT, ORCHIECTOMY:  - Diffuse large B-cell lymphoma  -See comment  COMMENT:  The sections of the testis and spermatic cord nodules show effacement of  the architecture by an atypical lymphoid infiltrate characterized by  predominance of medium and large lymphoid cells with vesicular chromatin  and small nucleoli associated with  brisk mitosis.  The appearance is  primarily diffuse with lack of atypical follicles.  Variable number of  admixed smaller lymphocytes are seen. To further evaluate this process,  flow cytometric analysis was performed ([id number]) and shows a  monoclonal kappa-restricted B-cell population.  In addition, a battery  of immunohistochemical stains was performed and shows that the atypical  lymphoid cells are positive for CD20, CD79a, PAX 5, CD10 (weak), BCL-2,  MUM-1 and cytoplasmic kappa.  No significant staining is seen with  CD138, cyclin D1, CD30, CD34, TdT, EBV in situ hybridization or  cytoplasmic lambda.  Ki67 shows variable expression ranging from 10% to  >50%.  There is an admixed T-cell population to a lesser extent as seen  with CD3 and CD5 and there is no apparent co-expression of CD5 in B-cell  areas.  The overall features are consistent with involvement by diffuse  large B-cell lymphoma, GCB type.  The results were discussed with Dr.  Watt and Onesimo on 03/11/2020.      ASSESSMENT & PLAN:  83 y.o. male with   H/o Rt Primary testicular large B cell lymphoma in 2000 treatment with R-CHOP x 6 cycles as part of clinical trial + IT MTX x 4 cycles and RT to left testicle   2 . H/o Relapsed Left Primary testicular large B  cell lymphoma 2021 stage I/IIE. Appears to be a new event since it is occurring 20 yrs after his previous rt testicular lymphoma S/p 6 cycles of R-CEOP -- currently in remission.   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  No longer using O2 while at home, only when traveling 3K above sea level.   PLAN: -Discussed lab results on 07/10/2024 in detail with patient. CBC showed WBC of 6.5K, hemoglobin of 16.5, and platelets of 200K.  -LDH normal -CMP normal -Patient has no lab or clinical evidence of recurrence of his primary testicular large B-cell lymphoma at this time. -continue to follow with pulmonology regularly for optimal evaluation and management -continue to follow regularly with dermatology -answered all of patient's questions in detail - Informed patient of the risks of long-term use of Testosterone  or Meloxicam , especially correlating with his ASA discontinuation.  - Encouraged discussing with his PCP/Specialist regarding medications (ASA, Testosterone , Meloxicam ).   FOLLOW-UP: RTC with Dr Onesimo with labs in 1 year  I spent 30 minutes counseling the patient face to face. The total time spent in the appointment was 35 minutes and more than 50% was on counseling and direct patient cares.    Emaline Onesimo MD MS AAHIVMS [facility] Hematology/Oncology Physician [facility]  (Office):       [phone] (Work cell):  [phone] (Fax):           [phone]  I,Emily Lagle,acting as a scribe for Emaline Onesimo, MD.,have documented all relevant documentation on the behalf of Emaline Onesimo, MD,as directed by  Emaline Onesimo, MD while in the presence of Emaline Onesimo, MD.

## 2024-07-23 ENCOUNTER — Telehealth: Payer: Self-pay

## 2024-07-23 NOTE — Telephone Encounter (Signed)
 Copied from CRM #[id number]. Topic: General - Other >> Jul 23, 2024 12:06 PM Miquel SAILOR wrote: Reason for CRM: Patient wife Antonio [name] calling in concern of husband well being and is requesting Mental Health Test.. She stated that he does not know on this issue and will like to speak with someone on how to go about the next step. Called and transferred to office.

## 2024-07-23 NOTE — Telephone Encounter (Signed)
 Spoke with patient (wife was unavailable). Scheduled appointment for next Monday with Tawni America, NP @ [facility]. Zell wrote it down on [name]'s calendar.

## 2024-07-23 NOTE — Telephone Encounter (Signed)
 Called patient to offer an appointment and to get more information, no answer and recording stated  Call can not be completed at this time  I will try again later, in the meantime will send to PCP as a FYI

## 2024-07-29 ENCOUNTER — Encounter: Admitting: Adult Health

## 2024-08-18 ENCOUNTER — Other Ambulatory Visit: Payer: Self-pay | Admitting: Internal Medicine

## 2024-08-21 NOTE — Progress Notes (Signed)
 QUADRY KAMPA                                          MRN: [id number]   08/21/2024   The [facility] Quality Team Specialist reviewed this patient medical record for the purposes of chart review for care gap closure. The following were reviewed: chart review for care gap closure-controlling blood pressure.    [facility] Quality Team

## 2024-08-25 ENCOUNTER — Other Ambulatory Visit: Payer: Self-pay | Admitting: Internal Medicine

## 2024-08-27 ENCOUNTER — Encounter: Payer: Self-pay | Admitting: Internal Medicine

## 2024-08-27 DIAGNOSIS — E291 Testicular hypofunction: Secondary | ICD-10-CM | POA: Diagnosis not present

## 2024-09-02 ENCOUNTER — Telehealth: Payer: Self-pay

## 2024-09-02 NOTE — Telephone Encounter (Signed)
 Copied from CRM #[id number]. Topic: Clinical - Medication Question >> Sep 02, 2024  2:08 PM Rozanna MATSU wrote: Reason for CRM: pt wants to know when is his next Lung CT supposed to be.   Per 11/16/2023 encounter I left a voice message that his CT is unchanged with regard to the lungs which shows stable pattern of scarring from COVID.  Will continue to monitor this Nothing further needed. Last CT was 11/10/23.  Dr. Theophilus when would you like the pt to have next CT scan.

## 2024-09-02 NOTE — Telephone Encounter (Signed)
 He does not need a follow-up CT scan as he has mild stable scarring in the lung that is likely from COVID infection.  Please make follow-up visit to check in on him.

## 2024-09-03 ENCOUNTER — Encounter: Admitting: Internal Medicine

## 2024-09-03 DIAGNOSIS — E291 Testicular hypofunction: Secondary | ICD-10-CM | POA: Diagnosis not present

## 2024-09-03 NOTE — Telephone Encounter (Signed)
 Atc x1 lmtcb

## 2024-09-03 NOTE — Telephone Encounter (Signed)
 Patient is returning a call to get lab results.  Please call him back at [phone].  Thanks.

## 2024-09-03 NOTE — Telephone Encounter (Signed)
 ATcx1

## 2024-09-17 ENCOUNTER — Non-Acute Institutional Stay: Admitting: Internal Medicine

## 2024-09-17 VITALS — BP 144/88 | HR 80 | Temp 98.2°F | Ht 72.0 in | Wt 186.2 lb

## 2024-09-17 DIAGNOSIS — M542 Cervicalgia: Secondary | ICD-10-CM | POA: Diagnosis not present

## 2024-09-17 DIAGNOSIS — R4189 Other symptoms and signs involving cognitive functions and awareness: Secondary | ICD-10-CM | POA: Diagnosis not present

## 2024-09-17 DIAGNOSIS — G4733 Obstructive sleep apnea (adult) (pediatric): Secondary | ICD-10-CM | POA: Diagnosis not present

## 2024-09-17 DIAGNOSIS — L299 Pruritus, unspecified: Secondary | ICD-10-CM

## 2024-09-17 MED ORDER — TRIAMCINOLONE ACETONIDE 0.1 % EX CREA: TOPICAL_CREAM | Freq: Two times a day (BID) | CUTANEOUS | 1 refills | Status: AC

## 2024-09-17 NOTE — Patient Instructions (Addendum)
 Can use Ceravue For Itching Moisturizer  [facility] [phone] I have made referral to them   For MRI of head Call [facility] [phone]

## 2024-09-22 NOTE — Progress Notes (Signed)
 Location:   [facility]   Place of Service:   Clinic  Provider:   Code Status:  Goals of Care:     08/29/2023    3:41 PM  Advanced Directives  Does Patient Have a Medical Advance Directive? Yes  Type of Estate Agent of [place];Living will;Out of facility DNR (pink MOST or yellow form)  Does patient want to make changes to medical advance directive? No - Patient declined  Copy of Healthcare Power of Attorney in Chart? No - copy requested  Would patient like information on creating a medical advance directive? No - Patient declined     Chief Complaint  Patient presents with   Follow-up    Patient has concern about rash on both his arms and the back of his neck.     HPI: Patient is a 83 y.o. [name] seen today for medical management of chronic diseases.    Lives in [place] in [place]   Discussed the use of AI scribe software for clinical note transcription with the patient, who gave verbal consent to proceed.  History of Present Illness   Antonio [name] is an 83 year old [name] who presents with neck discomfort and memory concerns.  Neck Pain He has had neck discomfort for about a year, described more as an awareness that "something is happening" than pain. He takes meloxicam  that was started for knee pain. He has no hand numbness or tingling, no morning stiffness, and no pain with neck motion, though the discomfort is persistent. Never been Imaged Cognitive impairment Noticed by Family He is concerned about memory lapses, including difficulty with tasks such as drawing a clock to a specified time on cognitive testing. He could recall some objects and do some calculations but struggled with parts of the testing. He has not seen a specialist for these concerns. Continues Rash in his Arms and Neck He uses a small tube of topical steroid cream, trimethylone, for skin issues and applies a thin layer followed by moisturizer. He is unsure about the correct  application method and alternates between CeraVe and Eucerin as moisturizers.      Previous History Patient has a history of COVID-pneumonia in January 2022. 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   ED (erectile dysfunction)    Elevated diaphragm    right   Erectile dysfunction    History of basal cell carcinoma (BCC) excision    multiple excision in office   History of kidney stones    History of seizure    03-03-2020 per pt x1 while taking interferan for melanoma in 2001 ,  none since, told a side effect of interferan   History of squamous cell carcinoma excision    multiple skin excision in office   Hyperlipidemia    Hypertension    followed by pcp  (03-03-2020 per pt had a stress test  approx. 2010, told normal)   LBBB (left bundle branch block)    Lymphoma of testis (HCC) 2000   s/p right orchiectomy    Mass of left testis    MGUS (monoclonal gammopathy of unknown significance)    oncologist--- dr onesimo   Mixed hyperlipidemia 10/26/2020   Personal history of malignant melanoma of skin dermatologist--- dr rolan molt   dx 2001 left calf area  s/p WLE ,  completed one year interferan (03-03-2020 pt states did not involve lymph nodes, no recurrence)   Urgency of urination    Wears hearing aid in both ears     Past Surgical History:  Procedure Laterality Date   CATARACT EXTRACTION  W/ INTRAOCULAR LENS IMPLANT Bilateral 2018   IR IMAGING GUIDED PORT INSERTION  03/31/2020   MELANOMA EXCISION  10/1999   ORCHIECTOMY Right 2000   ORCHIECTOMY Left 03/05/2020   Procedure: ORCHIECTOMY;  Surgeon: Watt Rush, MD;  [facility];  Service: Urology;  Laterality: Left;   TONSILLECTOMY  child   TOTAL KNEE ARTHROPLASTY Right 06/2009    Allergies  Allergen Reactions   Latex Itching    Latex tape causes itching and redness.    Outpatient Encounter Medications as of 09/17/2024  Medication Sig   carboxymethylcellulose (REFRESH PLUS) 0.5 % SOLN Place 1 drop into both eyes daily as needed.   Cholecalciferol  (D-3-5) 125 MCG (5000 UT) capsule Take 5,000 Units by mouth daily.   FLUAD 0.5 ML injection Inject 0.5 mLs into the muscle once.   fluticasone  (FLONASE ) 50 MCG/ACT nasal spray SPRAY 2 SPRAYS INTO EACH NOSTRIL EVERY DAY   losartan  (COZAAR ) 100 MG tablet TAKE 1 TABLET BY MOUTH EVERY DAY   meloxicam  (MOBIC ) 7.5 MG tablet TAKE 1 TABLET BY MOUTH EVERY DAY   Multiple Vitamins-Minerals (PRESERVISION AREDS 2 PO) Take 1 capsule by mouth daily.   propranolol  (INDERAL ) 10 MG tablet TAKE 1 TABLET BY MOUTH THREE TIMES A DAY   simvastatin  (ZOCOR ) 40 MG tablet TAKE 1 TABLET BY MOUTH EVERY DAY   Testosterone  20.25 MG/ACT (1.62%) GEL Apply 40.5 mg topically daily.   valACYclovir  (VALTREX ) 1000 MG tablet Take 1 tablet (1,000 mg total) by mouth 2 (two) times daily. Take 1000 (1 grams) PO 2 times daily for 3 days as needed for flares (herpes)   vitamin B-12 (CYANOCOBALAMIN ) 1000 MCG tablet Take 1,000 mcg by mouth daily.   [DISCONTINUED] triamcinolone  cream (KENALOG ) 0.1 % Apply topically 2 (two) times daily.   aspirin  81 MG EC tablet Take 1 tablet (81 mg total) by mouth daily. Swallow whole. (Patient not taking: Reported on 09/17/2024)   clobetasol  (TEMOVATE ) 0.05 % external solution APPLY TO AFFECTED AREA TWICE A DAY (Patient not taking: Reported on 09/17/2024)   loratadine   (CLARITIN ) 10 MG tablet Take 1 tablet (10 mg total) by mouth daily as needed for allergies. (Patient not taking: Reported on 09/17/2024)   triamcinolone  cream (KENALOG ) 0.1 % Apply topically 2 (two) times daily.   No facility-administered encounter medications on file as of 09/17/2024.    Review of Systems:  Review of Systems  Constitutional:  Negative for activity change, appetite change and unexpected weight change.  HENT: Negative.    Respiratory:  Negative for cough and shortness of breath.   Cardiovascular:  Negative for leg swelling.  Gastrointestinal:  Negative for constipation.  Genitourinary:  Negative for frequency.  Musculoskeletal:  Positive for neck pain and neck stiffness. Negative for arthralgias, gait problem and myalgias.  Skin:  Positive for  rash.  Neurological:  Negative for dizziness and weakness.  Psychiatric/Behavioral:  Positive for confusion. Negative for sleep disturbance.   All other systems reviewed and are negative.   Health Maintenance  Topic Date Due   Medicare Annual Wellness (AWV)  08/13/2021   DTaP/Tdap/Td (2 - Td or Tdap) 03/04/2024   COVID-19 Vaccine (8 - 2025-26 season) 06/17/2024   Pneumococcal Vaccine: 50+ Years  Completed   Influenza Vaccine  Completed   Zoster Vaccines- Shingrix  Completed   Meningococcal B Vaccine  Aged Out    Physical Exam: Vitals:   09/17/24 1554 09/17/24 1615  BP: (!) 144/98 (!) 144/88  Pulse: 80   Temp: 98.2 F (36.8 C)   SpO2: 96%   Weight: 186 lb 3.2 oz (84.5 kg)   Height: 6' (1.829 m)    Body mass index is 25.25 kg/m. Physical Exam Vitals reviewed.  Constitutional:      Appearance: Normal appearance.  HENT:     Head: Normocephalic.     Nose: Nose normal.     Mouth/Throat:     Mouth: Mucous membranes are moist.     Pharynx: Oropharynx is clear.  Eyes:     Pupils: Pupils are equal, round, and reactive to light.  Cardiovascular:     Rate and Rhythm: Normal rate and regular rhythm.     Pulses: Normal  pulses.     Heart sounds: No murmur heard. Pulmonary:     Effort: Pulmonary effort is normal. No respiratory distress.     Breath sounds: Normal breath sounds. No rales.  Abdominal:     General: Abdomen is flat. Bowel sounds are normal.     Palpations: Abdomen is soft.  Musculoskeletal:        General: No swelling.     Cervical back: Neck supple.  Skin:    General: Skin is warm.     Comments: Itchy macular Papular rash in spots over very dry skin   Neurological:     General: No focal deficit present.     Mental Status: He is alert and oriented to person, place, and time.  Psychiatric:        Mood and Affect: Mood normal.        Thought Content: Thought content normal.     Labs reviewed: Basic Metabolic Panel: Recent Labs    12/15/23 1121 07/10/24 1013  NA 141 143  K 4.4 4.5  CL 103 104  CO2 32 36*  GLUCOSE 82 96  BUN 17 14  CREATININE 0.76 0.88  CALCIUM 9.5 9.1   Liver Function Tests: Recent Labs    12/15/23 1121 07/10/24 1013  AST 23 29  ALT 35 38  ALKPHOS 58 55  BILITOT 1.1 1.0  PROT 6.6 6.9  ALBUMIN 4.4 4.4   No results for input(s): LIPASE, AMYLASE in the last 8760 hours. No results for input(s): AMMONIA in the last 8760 hours. CBC: Recent Labs    12/15/23 1121 07/10/24 1013  WBC 6.9 6.5  NEUTROABS 4.2 4.0  HGB 15.8 16.5  HCT 48.0 50.4  MCV 103.4* 103.1*  PLT 197 200   Lipid Panel: No results for input(s): CHOL, HDL, LDLCALC, TRIG, CHOLHDL, LDLDIRECT in the last 8760 hours. Lab Results  Component Value Date   HGBA1C 5.7 02/27/2020    Procedures since last visit: No results found.  Assessment/Plan 1. Cognitive impairment (Primary) Performed SLUMs He scored 18/30  Lost in recall Naming Animals  Failed his Clock  - Ambulatory referral to Neurology  2. Itchy skin Sees Dr Joshua Continue Triamcinolone  with Moisturizer  3. Neck pain of over 3 months duration  - MR CERVICAL SPINE W WO CONTRAST;  Future     Labs/tests ordered:  * No order type specified * Next appt:  11/25/2024

## 2024-09-27 ENCOUNTER — Ambulatory Visit (HOSPITAL_BASED_OUTPATIENT_CLINIC_OR_DEPARTMENT_OTHER): Admission: RE | Admit: 2024-09-27 | Discharge: 2024-09-27 | Attending: Internal Medicine | Admitting: Internal Medicine

## 2024-09-27 DIAGNOSIS — M542 Cervicalgia: Secondary | ICD-10-CM

## 2024-09-27 MED ORDER — GADOBUTROL 1 MMOL/ML IV SOLN
8.0000 mL | Freq: Once | INTRAVENOUS | Status: DC | PRN
  Filled 2024-09-27: qty 8

## 2024-09-27 MED ORDER — GADOBUTROL 1 MMOL/ML IV SOLN
9.0000 mL | Freq: Once | INTRAVENOUS | Status: AC | PRN
  Administered 2024-09-27: 9 mL via INTRAVENOUS
  Filled 2024-09-27: qty 10

## 2024-09-30 ENCOUNTER — Non-Acute Institutional Stay: Admitting: Adult Health

## 2024-09-30 ENCOUNTER — Encounter: Payer: Self-pay | Admitting: Adult Health

## 2024-09-30 VITALS — BP 130/88 | HR 73 | Temp 97.4°F | Ht 72.0 in | Wt 183.0 lb

## 2024-09-30 DIAGNOSIS — M4802 Spinal stenosis, cervical region: Secondary | ICD-10-CM

## 2024-09-30 DIAGNOSIS — R200 Anesthesia of skin: Secondary | ICD-10-CM

## 2024-09-30 DIAGNOSIS — E7800 Pure hypercholesterolemia, unspecified: Secondary | ICD-10-CM

## 2024-09-30 NOTE — Progress Notes (Signed)
 Location:  [facility]  POS: Clinic  Provider: Tawni America, ANP  Goals of Care:     08/29/2023    3:41 PM  Advanced Directives  Does Patient Have a Medical Advance Directive? Yes  Type of Estate Agent of [place];Living will;Out of facility DNR (pink MOST or yellow form)  Does patient want to make changes to medical advance directive? No - Patient declined  Copy of Healthcare Power of Attorney in Chart? No - copy requested  Would patient like information on creating a medical advance directive? No - Patient declined     Chief Complaint  Patient presents with   Acute Visit    Patient experiencing numbness in left leg     HPI:  History of Present Illness Antonio [name] is an 83 year old male who presents with numbness and tingling in the neck and right leg.  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   ED (erectile dysfunction)    Elevated diaphragm    right   Erectile dysfunction    History of basal cell carcinoma (BCC) excision    multiple excision in office   History of kidney stones    History of seizure    03-03-2020 per pt x1 while taking interferan for melanoma in 2001 ,  none since, told a side effect of interferan   History of squamous cell carcinoma excision    multiple skin excision in office   Hyperlipidemia    Hypertension    followed by pcp  (03-03-2020 per pt had a stress test approx. 2010, told normal)   LBBB (left bundle branch block)    Lymphoma of testis (HCC) 2000   s/p right orchiectomy    Mass of left testis    MGUS (monoclonal gammopathy of unknown significance)    oncologist--- dr onesimo   Mixed hyperlipidemia 10/26/2020   Personal history of malignant melanoma of skin dermatologist--- dr rolan molt   dx 2001 left calf area  s/p WLE ,  completed one year interferan (03-03-2020 pt states did not involve lymph nodes, no recurrence)   Urgency of urination    Wears hearing aid in both ears     Past Surgical History:  Procedure Laterality Date   CATARACT EXTRACTION W/ INTRAOCULAR LENS IMPLANT Bilateral 2018   IR IMAGING GUIDED PORT INSERTION  03/31/2020  MELANOMA EXCISION  10/1999   ORCHIECTOMY Right 2000   ORCHIECTOMY Left 03/05/2020   Procedure: ORCHIECTOMY;  Surgeon: Watt Rush, MD;  Location: [facility];  Service: Urology;  Laterality: Left;   TONSILLECTOMY  child   TOTAL KNEE ARTHROPLASTY Right 06/2009    Allergies[1]  Outpatient Encounter Medications as of 09/30/2024  Medication Sig   carboxymethylcellulose (REFRESH PLUS) 0.5 % SOLN Place 1 drop into both eyes daily as needed.   Cholecalciferol  (D-3-5) 125 MCG (5000 UT) capsule Take 5,000 Units by mouth daily.   fluticasone  (FLONASE ) 50 MCG/ACT nasal spray SPRAY 2 SPRAYS INTO EACH NOSTRIL EVERY DAY    losartan  (COZAAR ) 100 MG tablet TAKE 1 TABLET BY MOUTH EVERY DAY   meloxicam  (MOBIC ) 7.5 MG tablet TAKE 1 TABLET BY MOUTH EVERY DAY   Multiple Vitamins-Minerals (PRESERVISION AREDS 2 PO) Take 1 capsule by mouth daily.   propranolol  (INDERAL ) 10 MG tablet TAKE 1 TABLET BY MOUTH THREE TIMES A DAY   simvastatin  (ZOCOR ) 40 MG tablet TAKE 1 TABLET BY MOUTH EVERY DAY   Testosterone  20.25 MG/ACT (1.62%) GEL Apply 40.5 mg topically daily.   triamcinolone  cream (KENALOG ) 0.1 % Apply topically 2 (two) times daily.   valACYclovir  (VALTREX ) 1000 MG tablet Take 1 tablet (1,000 mg total) by mouth 2 (two) times daily. Take 1000 (1 grams) PO 2 times daily for 3 days as needed for flares (herpes)   vitamin B-12 (CYANOCOBALAMIN ) 1000 MCG tablet Take 1,000 mcg by mouth daily.   aspirin  81 MG EC tablet Take 1 tablet (81 mg total) by mouth daily. Swallow whole. (Patient not taking: Reported on 09/30/2024)   clobetasol  (TEMOVATE ) 0.05 % external solution APPLY TO AFFECTED AREA TWICE A DAY (Patient not taking: Reported on 09/30/2024)   FLUAD 0.5 ML injection Inject 0.5 mLs into the muscle once. (Patient not taking: Reported on 09/30/2024)   loratadine  (CLARITIN ) 10 MG tablet Take 1 tablet (10 mg total) by mouth daily as needed for allergies. (Patient not taking: Reported on 09/30/2024)   No facility-administered encounter medications on file as of 09/30/2024.    Review of Systems:  Review of Systems  Constitutional:  Negative for activity change, appetite change, chills, diaphoresis, fatigue and fever.  HENT:  Negative for congestion.   Respiratory:  Negative for cough, shortness of breath and wheezing.   Cardiovascular:  Negative for chest pain and leg swelling.  Gastrointestinal:  Negative for abdominal distention, abdominal pain, constipation, diarrhea, nausea and vomiting.  Genitourinary:  Negative for difficulty urinating, dysuria and urgency.  Musculoskeletal:  Positive for arthralgias and neck pain  (irrittant). Negative for back pain, gait problem and myalgias.  Skin:  Negative for rash.  Neurological:  Positive for tremors and numbness. Negative for dizziness and weakness.  Psychiatric/Behavioral:  Negative for confusion.     Health Maintenance  Topic Date Due   Medicare Annual Wellness (AWV)  08/13/2021   DTaP/Tdap/Td (2 - Td or Tdap) 03/04/2024   COVID-19 Vaccine (8 - 2025-26 season) 06/17/2024   Pneumococcal Vaccine: 50+ Years  Completed   Influenza Vaccine  Completed   Zoster Vaccines- Shingrix  Completed   Meningococcal B Vaccine  Aged Out    Physical Exam: Vitals:   09/30/24 1523  BP: 130/88  Pulse: 73  Temp: (!) 97.4 F (36.3 C)  SpO2: 98%  Weight: 183 lb (83 kg)  Height: 6' (1.829 m)   Body mass index is 24.82 kg/m. Physical Exam  Labs reviewed: Basic Metabolic Panel: Recent Labs  12/15/23 1121 07/10/24 1013  NA 141 143  K 4.4 4.5  CL 103 104  CO2 32 36*  GLUCOSE 82 96  BUN 17 14  CREATININE 0.76 0.88  CALCIUM 9.5 9.1   Liver Function Tests: Recent Labs    12/15/23 1121 07/10/24 1013  AST 23 29  ALT 35 38  ALKPHOS 58 55  BILITOT 1.1 1.0  PROT 6.6 6.9  ALBUMIN 4.4 4.4   No results for input(s): LIPASE, AMYLASE in the last 8760 hours. No results for input(s): AMMONIA in the last 8760 hours. CBC: Recent Labs    12/15/23 1121 07/10/24 1013  WBC 6.9 6.5  NEUTROABS 4.2 4.0  HGB 15.8 16.5  HCT 48.0 50.4  MCV 103.4* 103.1*  PLT 197 200   Lipid Panel: No results for input(s): CHOL, HDL, LDLCALC, TRIG, CHOLHDL, LDLDIRECT in the last 8760 hours. Lab Results  Component Value Date   HGBA1C 5.7 02/27/2020    Procedures since last visit: MR CERVICAL SPINE W WO CONTRAST Result Date: 09/30/2024 CLINICAL DATA:  Cervical radiculopathy, neck pain EXAM: MRI CERVICAL SPINE WITHOUT AND WITH CONTRAST TECHNIQUE: Multiplanar and multiecho pulse sequences of the cervical spine, to include the craniocervical junction and  cervicothoracic junction, were obtained without and with intravenous contrast. CONTRAST:  9mL GADAVIST  GADOBUTROL  1 MMOL/ML IV SOLN COMPARISON:  None Available. FINDINGS: The craniocervical junction is normal. There is no significant bone marrow signal abnormality. The cervical spinal cord is normal. C2-C3: There is mild degenerative disc disease with a broad-based disc bulge that extends more laterally on the right. There is severe right facet arthropathy. There is a moderate right neural foraminal stenosis. No spinal stenosis C3-C4: There is mild degenerative disc disease. There is a prominent right neural foraminal spur causing severe right neural foraminal stenosis. There is moderate right facet arthropathy. C4-C5: There is severe degenerative disc disease. There is a foraminal spur on the right. There is ankylosis of the right facet joint. There is severe right neural foraminal stenosis. No spinal stenosis C5-C6: There is severe degenerative disc disease. There are bilateral foraminal spurs and mild facet arthropathy. There is mild bilateral neural foraminal stenosis. No spinal stenosis C6-C7: There is severe degenerative disc disease with a disc bulge. There are small foraminal spurs. The facet joints are normal. There is mild spinal stenosis and mild bilateral neural foraminal stenosis C7-T1: Normal IMPRESSION: 1. Cervical spondylosis 2. There is no significant spinal stenosis.  No disc herniation 3. There is severe right neural foraminal stenosis at C3-4 due to a foraminal spur and moderate facet arthropathy 4. There is severe right neural foraminal stenosis at C4-5 due to a neural foraminal spur. There is ankylosis of the right facet joint. Electronically Signed   By: Nancyann Burns M.D.   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 Symptoms include neck tingling and right leg numbness  - Referred to neurosurgeon for evaluation. - Ordered B12 level. - Ordered cholesterol and TSH levels.  General Health Maintenance Routine health maintenance discussed, including monitoring B12, cholesterol, and TSH levels. - Scheduled blood work for B12, cholesterol, and TSH levels.      Labs/tests ordered:  * No order type specified * Lipid TSH B 12 Folate to be drawn 10/03/24 Next appt:  11/25/2024   Total time :  time greater than 50% of total  time spent doing pt counseling and coordination of care         [1]  Allergies Allergen Reactions   Latex Itching    Latex tape causes itching and redness.

## 2024-09-30 NOTE — Patient Instructions (Signed)
 [facility] [phone]  A referral has been placed. If you do not hear from them in the next few days please give them a call at the number listed above  Please come to the clinic for labs on Thursday

## 2024-10-01 NOTE — Telephone Encounter (Signed)
 ATC x2.  LVM to return call.  Sent fpl group.  When he calls back, he needs a f/u up with Dr. Theophilus.

## 2024-10-03 ENCOUNTER — Encounter: Payer: Self-pay | Admitting: Internal Medicine

## 2024-10-03 LAB — LIPID PANEL
Cholesterol: 139 (ref 0–200)
HDL: 63 (ref 35–70)
LDL Cholesterol: 62
Triglycerides: 69 (ref 40–160)

## 2024-10-03 LAB — TSH: TSH: 3.06 (ref 0.41–5.90)

## 2024-10-03 LAB — VITAMIN B12: Vitamin B-12: 1279

## 2024-10-04 ENCOUNTER — Telehealth: Payer: Self-pay | Admitting: Adult Health

## 2024-10-04 NOTE — Telephone Encounter (Signed)
 Could we please call Antonio [name] and let him know that his B12 is not low, thyroid  panel and folate levels are normal. The labs we checked do not show any explanation for the numbness that he has in his leg and neck.  His cholesterol is normal and at goal. He should continue the simvastatin .   Thank you  Bari America NP

## 2024-10-04 NOTE — Telephone Encounter (Signed)
 Left message on voicemail for patient to return call when available.Please E2C2 to share the results with the patient from the provider. If they question or concerns please to call the office

## 2024-10-07 ENCOUNTER — Telehealth: Payer: Self-pay

## 2024-10-07 NOTE — Telephone Encounter (Signed)
 Copied from CRM #[id number]. Topic: Clinical - Lab/Test Results >> Oct 07, 2024 10:21 AM Rilla NOVAK wrote: Reason for CRM: Patient returning call to [facility] for his test results.  Please call patient @ [phone].   ATC x1. LMTCB. Pt has been unreachable by phone.

## 2024-10-07 NOTE — Telephone Encounter (Signed)
 Left message on voicemail for patient to return call when available

## 2024-10-08 NOTE — Telephone Encounter (Signed)
 Patient is active on mychart providers response was sent

## 2024-10-23 ENCOUNTER — Telehealth: Payer: Self-pay

## 2024-10-23 NOTE — Telephone Encounter (Signed)
 Please advise

## 2024-10-23 NOTE — Telephone Encounter (Signed)
 Copied from CRM [id number]. Topic: General - Other >> Oct 23, 2024  2:35 PM Antonio [name] wrote: Reason for CRM:  PT wfie Darice is worried she has had no contact from his Cancer PCP. Also she states since he has not heard back yet from Cancer PCP if he should go to neurologist app. Needs call back on calcification for next steps. [phone]

## 2024-10-24 NOTE — Telephone Encounter (Signed)
 He has appointment with Dr Onesimo coming up. I left message on this number for her  to let me know if they need any other information

## 2024-10-30 NOTE — Telephone Encounter (Signed)
 Apt [email or web address].nfn

## 2024-10-31 ENCOUNTER — Inpatient Hospital Stay: Attending: Hematology | Admitting: Hematology

## 2024-10-31 ENCOUNTER — Inpatient Hospital Stay

## 2024-10-31 VITALS — BP 149/92 | HR 72 | Temp 97.7°F | Resp 18 | Wt 183.6 lb

## 2024-10-31 DIAGNOSIS — C83398 Diffuse large b-cell lymphoma of other extranodal and solid organ sites: Secondary | ICD-10-CM | POA: Diagnosis present

## 2024-10-31 DIAGNOSIS — C8335 Diffuse large B-cell lymphoma, lymph nodes of inguinal region and lower limb: Secondary | ICD-10-CM

## 2024-10-31 DIAGNOSIS — Z85820 Personal history of malignant melanoma of skin: Secondary | ICD-10-CM | POA: Diagnosis not present

## 2024-10-31 DIAGNOSIS — M4722 Other spondylosis with radiculopathy, cervical region: Secondary | ICD-10-CM | POA: Insufficient documentation

## 2024-10-31 LAB — CMP (CANCER CENTER ONLY)
ALT: 29 U/L (ref 0–44)
AST: 26 U/L (ref 15–41)
Albumin: 4.7 g/dL (ref 3.5–5.0)
Alkaline Phosphatase: 69 U/L (ref 38–126)
Anion gap: 9 (ref 5–15)
BUN: 18 mg/dL (ref 8–23)
CO2: 31 mmol/L (ref 22–32)
Calcium: 9.7 mg/dL (ref 8.9–10.3)
Chloride: 101 mmol/L (ref 98–111)
Creatinine: 0.97 mg/dL (ref 0.61–1.24)
GFR, Estimated: >60 mL/min
Glucose, Bld: 77 mg/dL (ref 70–99)
Potassium: 4.1 mmol/L (ref 3.5–5.1)
Sodium: 141 mmol/L (ref 135–145)
Total Bilirubin: 1 mg/dL (ref 0.0–1.2)
Total Protein: 7.6 g/dL (ref 6.5–8.1)

## 2024-10-31 LAB — CBC WITH DIFFERENTIAL (CANCER CENTER ONLY)
Abs Immature Granulocytes: 0.05 K/uL (ref 0.00–0.07)
Basophils Absolute: 0.1 K/uL (ref 0.0–0.1)
Basophils Relative: 1 %
Eosinophils Absolute: 0.3 K/uL (ref 0.0–0.5)
Eosinophils Relative: 3 %
HCT: 50.8 % (ref 39.0–52.0)
Hemoglobin: 17.4 g/dL — ABNORMAL HIGH (ref 13.0–17.0)
Immature Granulocytes: 1 %
Lymphocytes Relative: 18 %
Lymphs Abs: 1.7 K/uL (ref 0.7–4.0)
MCH: 35.3 pg — ABNORMAL HIGH (ref 26.0–34.0)
MCHC: 34.3 g/dL (ref 30.0–36.0)
MCV: 103 fL — ABNORMAL HIGH (ref 80.0–100.0)
Monocytes Absolute: 1.3 K/uL — ABNORMAL HIGH (ref 0.1–1.0)
Monocytes Relative: 14 %
Neutro Abs: 5.7 K/uL (ref 1.7–7.7)
Neutrophils Relative %: 63 %
Platelet Count: 213 K/uL (ref 150–400)
RBC: 4.93 MIL/uL (ref 4.22–5.81)
RDW: 13.9 % (ref 11.5–15.5)
WBC Count: 9.1 K/uL (ref 4.0–10.5)
nRBC: 0 % (ref 0.0–0.2)

## 2024-10-31 LAB — LACTATE DEHYDROGENASE: LDH: 180 U/L (ref 105–235)

## 2024-10-31 LAB — HEPATITIS B SURFACE ANTIGEN: Hepatitis B Surface Ag: NONREACTIVE

## 2024-10-31 LAB — HEPATITIS C ANTIBODY: HCV Ab: NONREACTIVE

## 2024-10-31 LAB — HIV ANTIBODY (ROUTINE TESTING W REFLEX): HIV Screen 4th Generation wRfx: NONREACTIVE

## 2024-10-31 NOTE — Progress Notes (Signed)
 " HEMATOLOGY ONCOLOGY PROGRESS NOTE  Date of service: 10/31/2024  Patient Care Team: Charlanne Fredia CROME, MD as PCP - General (Internal Medicine)  CHIEF COMPLAINT/PURPOSE OF CONSULTATION: Follow-up for continued evaluation and management of newly diagnosed cutaneous large b cell lymphoma.  HISTORY OF PRESENTING ILLNESS: Antonio [name] is a wonderful 84 y.o. male who has been referred to us  by Dr. Annabella Rase for evaluation and management of Bone lesion of left lower leg. The pt reports that he is doing well overall.    The pt reports that he cannot feel the ovoid finding from his 04/09/18 MRI as noted below. He denies being able to feel anything in his skin as well. He notes that he received a needle injection in his knee prior to this MRI. The pt notes that his knee has lost much cartilage and is considering a knee replacement. He denies any recent injuries to his knee or falls. He has contacted Dr. Alm Angle in surgery and another surgeon as well and will be making an appointment.    The pt notes that he does not feel any differently recently as compared to 6 months to a year ago. He does note that he normally has constipation in the spring, and has continued to have mild constipation. He denies any other concerns or symptoms.    The pt previously saw my colleague Dr. Sandria Gell for primary testicular B-cell lymphoma in 2000. The pt notes that the involvement was limited to only one testicle. He received 6 intrathecal treatments in his spine, and received 4 cycles of chemotherapy (thinks this was CHOP) and radiation as well. The pt was followed by an oncologist for several years at the [facility]  in [place] after moving from [place] after his lymphoma treatment.    The pt notes that he was first diagnosed with melanoma in October 2000. He noticed a strange spot on his left leg that was surgically resected. The pt's personal notes report a 2.9cm thick, level 4,  no LN involvement, high risk stage II, treated with Interferon three times a week for one year. He notes some squamous cell and basal cell involvements as well and sees a dermatologist, Dr. Rolan Molt, regularly.    Of note prior to the patient's visit today, pt has had MRI Left Knee completed on 04/09/18 with results revealing An ovoid, lobulated, 10mm T2 hyperintense focus within the medial subcutaneous far 1.43mm distal to the joint demonstrates demonstrates nonspecific imaging characteristics.    Most recent lab results (03/01/18) of CBC w/diff is as follows: all values are WNL except for RBC at 4.54, MCV at 100.4, MCH at 33.8.    On review of systems, pt reports left knee pain, mild constipation, and denies new or concerning symptoms, noticing any new lumps or bumps, pain along the spine, abdominal pains, problems passing urine, testicular pain/swelling, and any other symptoms.    SUMMARY OF ONCOLOGIC HISTORY: Oncology History  Diffuse large B cell lymphoma (HCC)  03/17/2020 Initial Diagnosis   Diffuse large B cell lymphoma (HCC)   03/25/2020 - 07/13/2020 Chemotherapy   Patient is on Treatment Plan : NON-HODGKIN'S LYMPHOMA R-CEOP Q21D     Testicular malignant neoplasm (HCC)  03/17/2020 Initial Diagnosis   Testicular malignant neoplasm (HCC)   03/25/2020 - 07/13/2020 Chemotherapy   Patient is on Treatment Plan : NON-HODGKIN'S LYMPHOMA R-CEOP Q21D       INTERVAL HISTORY: Antonio [name] is a 84 y.o. male who is here today  for continued evaluation and management of recurrent testicular large B-cell lymphoma. He is accompanied by his wife and daughter today.   he was last seen by me on 07/10/2024; at the time he did not have any concerns and was doing well.   Today, he discussed his recent leg cutaneous lesion biopsy results. He noticed the distal tibial region of his skin was red and bruised on appearance for the past few months and has continued to grow. It was a raised skin patch but was not  draining. The ulceration and crusting occurred after the skin biopsy of the region.   His wife endoreses his frequent naps in the past few months. She also endorses his appetite has decreased, and some weight loss in the past three months.    Wt Readings from Last 3 Encounters:  10/31/24 183 lb 9.6 oz (83.3 kg)  09/30/24 183 lb (83 kg)  09/17/24 186 lb 3.2 oz (84.5 kg)     He denies any new infection issues, fevers/chills, drenching night sweats, unexpected weight change, new lumps/bumps, and new skin lesions on legs, back, or trunk.    REVIEW OF SYSTEMS:   10 Point review of systems of done and is negative except as noted above.  MEDICAL HISTORY Past Medical History:  Diagnosis Date   Age-related macular degeneration, dry, both eyes    B-cell lymphoma [facility]) oncologist--- dr onesimo   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   ED (erectile dysfunction)    Elevated diaphragm    right   Erectile dysfunction    History of basal cell carcinoma (BCC) excision    multiple excision in office   History of kidney stones    History of seizure    03-03-2020 per pt x1 while taking interferan for melanoma in 2001 ,  none since, told a side effect of interferan   History of squamous cell carcinoma excision    multiple skin excision in office   Hyperlipidemia    Hypertension    followed by pcp  (03-03-2020 per pt had a stress test approx. 2010, told normal)   LBBB (left bundle branch block)    Lymphoma of testis (HCC) 2000   s/p right orchiectomy    Mass of left testis    MGUS (monoclonal gammopathy of unknown significance)    oncologist--- dr onesimo   Mixed hyperlipidemia 10/26/2020   Personal history of malignant melanoma of skin dermatologist--- dr rolan molt   dx 2001 left calf area  s/p WLE ,  completed  one year interferan (03-03-2020 pt states did not involve lymph nodes, no recurrence)   Urgency of urination    Wears hearing aid in both ears     SURGICAL HISTORY Past Surgical History:  Procedure Laterality Date   CATARACT EXTRACTION W/ INTRAOCULAR LENS IMPLANT Bilateral 2018   IR IMAGING GUIDED PORT INSERTION  03/31/2020   MELANOMA EXCISION  10/1999   ORCHIECTOMY Right 2000   ORCHIECTOMY Left 03/05/2020   Procedure: ORCHIECTOMY;  Surgeon: Watt Rush, MD;  Location: [facility];  Service: Urology;  Laterality: Left;   TONSILLECTOMY  child   TOTAL KNEE ARTHROPLASTY Right 06/2009    SOCIAL HISTORY Social History[1]  Social History   Social History Narrative   Social History      Diet?good  Do you drink/eat things with caffeine? seldom      Marital status?          yes                          What year were you married? 1962      Do you live in a house, apartment, assisted living, condo, trailer, etc.? house      Is it one or more stories?1      How many persons live in your home? 2      Do you have any pets in your home? (please list) no      Highest level of education completed? college      Current or past profession: president and CEO of [employer]      Advanced Directives      Do you exercise?   yes                                   Type & how often? Garden work, therapist, music care      Do you have a living will? yes      Do you have a DNR form?       yes                            If not, do you want to discuss one?      Do you have signed POA/HPOA for forms?  yes      Functional Status      Do you have difficulty bathing or dressing yourself? no      Do you have difficulty preparing food or eating? no      Do you have difficulty managing your medications? no      Do you have difficulty managing your finances? no      Do you have difficulty affording your medications? no    SOCIAL DRIVERS OF HEALTH SDOH Screenings   Food Insecurity:  Unknown (01/19/2024)  Housing: Low Risk (01/19/2024)  Transportation Needs: Unmet Transportation Needs (01/19/2024)  Utilities: Not At Risk (07/14/2022)  Alcohol Screen: Low Risk (01/19/2024)  Depression (PHQ2-9): Low Risk (09/17/2024)  Financial Resource Strain: Low Risk (01/19/2024)  Physical Activity: Unknown (01/19/2024)  Social Connections: Unknown (01/19/2024)  Stress: No Stress Concern Present (01/19/2024)  Tobacco Use: Low Risk (09/30/2024)     FAMILY HISTORY Family History  Problem Relation Age of Onset   Cataracts Mother    Transient ischemic attack Mother    Asthma Mother    Cataracts Father    Diabetes Father        borderline   Hypertension Father    Hyperlipidemia Father    Hyperlipidemia Son    Hypertension Son    Amblyopia Neg Hx    Blindness Neg Hx    Glaucoma Neg Hx    Macular degeneration Neg Hx    Retinal detachment Neg Hx    Strabismus Neg Hx    Retinitis pigmentosa Neg Hx      ALLERGIES: is allergic to latex.  MEDICATIONS  Current Outpatient Medications  Medication Sig Dispense Refill   carboxymethylcellulose (REFRESH PLUS) 0.5 % SOLN Place 1 drop into both eyes daily as needed.     Cholecalciferol  (D-3-5) 125 MCG (5000 UT) capsule Take 5,000 Units by mouth daily.     fluticasone  (FLONASE )  50 MCG/ACT nasal spray SPRAY 2 SPRAYS INTO EACH NOSTRIL EVERY DAY 48 mL 4   losartan  (COZAAR ) 100 MG tablet TAKE 1 TABLET BY MOUTH EVERY DAY 90 tablet 1   meloxicam  (MOBIC ) 7.5 MG tablet TAKE 1 TABLET BY MOUTH EVERY DAY 90 tablet 1   Multiple Vitamins-Minerals (PRESERVISION AREDS 2 PO) Take 1 capsule by mouth daily.     propranolol  (INDERAL ) 10 MG tablet TAKE 1 TABLET BY MOUTH THREE TIMES A DAY 270 tablet 1   simvastatin  (ZOCOR ) 40 MG tablet TAKE 1 TABLET BY MOUTH EVERY DAY 90 tablet 1   Testosterone  20.25 MG/ACT (1.62%) GEL Apply 40.5 mg topically daily.     triamcinolone  cream (KENALOG ) 0.1 % Apply topically 2 (two) times daily. 30 g 1   valACYclovir  (VALTREX ) 1000 MG  tablet Take 1 tablet (1,000 mg total) by mouth 2 (two) times daily. Take 1000 (1 grams) PO 2 times daily for 3 days as needed for flares (herpes) 30 tablet 3   vitamin B-12 (CYANOCOBALAMIN ) 1000 MCG tablet Take 1,000 mcg by mouth daily.     No current facility-administered medications for this visit.    PHYSICAL EXAMINATION: ECOG PERFORMANCE STATUS: 1 - Symptomatic but completely ambulatory VITALS: Vitals:   10/31/24 1041 10/31/24 1052  BP: (!) 151/84 (!) 149/92  Pulse: 72   Resp: 18   Temp: 97.7 F (36.5 C)   SpO2: 95%    Filed Weights   10/31/24 1041  Weight: 183 lb 9.6 oz (83.3 kg)   Body mass index is 24.9 kg/m.  GENERAL: alert, in no acute distress and comfortable SKIN: no acute rashes, no significant lesions (+) skin lesions and tenderness on palpation of new skin changes on the upper back EYES: conjunctiva are pink and non-injected, sclera anicteric OROPHARYNX: MMM, no exudates, no oropharyngeal erythema or ulceration NECK: supple, no JVD LYMPH:  no palpable lymphadenopathy in the cervical, axillary or inguinal regions LUNGS: clear to auscultation b/l with normal respiratory effort HEART: regular rate & rhythm ABDOMEN:  normoactive bowel sounds , non tender, not distended, no hepatosplenomegaly Extremity: no pedal edema PSYCH: alert & oriented x 3 with fluent speech NEURO: no focal motor/sensory deficits  LABORATORY DATA:   I have reviewed the data as listed     Latest Ref Rng & Units 10/31/2024   11:29 AM 07/10/2024   10:13 AM 12/15/2023   11:21 AM  CBC EXTENDED  WBC 4.0 - 10.5 K/uL 9.1  6.5  6.9   RBC 4.22 - 5.81 MIL/uL 4.93  4.89  4.64   Hemoglobin 13.0 - 17.0 g/dL 82.5  83.4  84.1   HCT 39.0 - 52.0 % 50.8  50.4  48.0   Platelets 150 - 400 K/uL 213  200  197   NEUT# 1.7 - 7.7 K/uL 5.7  4.0  4.2   Lymph# 0.7 - 4.0 K/uL 1.7  1.5  1.6        Latest Ref Rng & Units 10/31/2024   11:29 AM 07/10/2024   10:13 AM 12/15/2023   11:21 AM  CMP  Glucose 70 - 99  mg/dL 77  96  82   BUN 8 - 23 mg/dL 18  14  17    Creatinine 0.61 - 1.24 mg/dL 9.02  9.11  9.23   Sodium 135 - 145 mmol/L 141  143  141   Potassium 3.5 - 5.1 mmol/L 4.1  4.5  4.4   Chloride 98 - 111 mmol/L 101  104  103   CO2  22 - 32 mmol/L 31  36  32   Calcium 8.9 - 10.3 mg/dL 9.7  9.1  9.5   Total Protein 6.5 - 8.1 g/dL 7.6  6.9  6.6   Total Bilirubin 0.0 - 1.2 mg/dL 1.0  1.0  1.1   Alkaline Phos 38 - 126 U/L 69  55  58   AST 15 - 41 U/L 26  29  23    ALT 0 - 44 U/L 29  38  35    FISH Analysis [facility]   MYC (Bq24) rearrangement NOT DETECTED 98F signal pattern with no abnormalities identified MYC /IgH/CEN8t (8:14) NOT DETECTED 2R2GZA signal pattern with no abnormalities identified   Report of Dermatopathology  Diagnosis: Skin Biopsy (P), left superior central forehead Shave & ED&C Squamous cell carcinoma in situ arisin gin a seborrheic keratosis Skin Biopsy - (P) (B) right inferior central anterior tibial shave B-cell lymphoma with aggressive features. See note  NOTE: THERE IS A DIFFUSE DERMAL INFILTRATE WITH A GRENZ ZONE CONSISTING OF PREDOMINATLY MODERATE TO LARGE SIZED ATYPICAL CELLS THAT ARE POSITIVE FOR THE B-CELL MARKERS CD20, CD79A, AND PAX5. CD10 APPEARS POSITIVE IN A SMALL SUBSET BUT THIS IS IN THE PACKGROUND OF EXTENSIVE STROMAL STAINING THAT MAKES INTERPRETATION DIFFICULT. BCL6 IS ALSO POSITIVE IN THE LARGE SUBSET AS IS MUM1. THE CELLS APPEAR STRONGLY POSITIVE FOR BCL-2. THE PROLIFERATION RATE BY KI-67 IS HIGH AT APPROXIMATELY 70% CD30, MPO, CD117 AND CYCLIN D1. A FOLLICULAR  DENDRITIC CELL MES[place]ORK IS NOT PRESENT BY CD21. THE OVERALL FINDINGS ARE COSISTENT FOR AN AGGRESSIVE B-CALL LYMPHOMA AND MOST LIKELY REPRESENTS A LARGE B-CALL LYMPHOMA. GIVEN THE LOCATION A PRIMARY DIFFUSE LARGE B-CELL LYMPHOMA, LEG TYPE IS HIGH IN THE DIFFERENTIAL BUT CORRELATION FOR SYSTEMIC INVOLVEMENT (I.E. CLINICAL/RADIOLOGICAL CORRELATION) MUSCULUS BE DETERMINED. IN ADDITION GIVEN THE HIGH  PROLIFERATION RATE A FISH PANEL TO RULE OUT A HIGH-GRADE B0CELL LYMPHOMA (I.E. DOUBLE HIT.TRIPLE HIT LYMPHOMA) IS PENDING AND WILL BE REPORTED IN AN ADDENDUM.     MR Cervical Spine W WO Contrast 09/30/2024  Narrative & Impression  CLINICAL DATA:  Cervical radiculopathy, neck pain   EXAM: MRI CERVICAL SPINE WITHOUT AND WITH CONTRAST   TECHNIQUE: Multiplanar and multiecho pulse sequences of the cervical spine, to include the craniocervical junction and cervicothoracic junction, were obtained without and with intravenous contrast.   CONTRAST:  9mL GADAVIST  GADOBUTROL  1 MMOL/ML IV SOLN   COMPARISON:  None Available.   FINDINGS: The craniocervical junction is normal.   There is no significant bone marrow signal abnormality.   The cervical spinal cord is normal.   C2-C3: There is mild degenerative disc disease with a broad-based disc bulge that extends more laterally on the right. There is severe right facet arthropathy. There is a moderate right neural foraminal stenosis. No spinal stenosis   C3-C4: There is mild degenerative disc disease. There is a prominent right neural foraminal spur causing severe right neural foraminal stenosis. There is moderate right facet arthropathy.   C4-C5: There is severe degenerative disc disease. There is a foraminal spur on the right. There is ankylosis of the right facet joint. There is severe right neural foraminal stenosis. No spinal stenosis   C5-C6: There is severe degenerative disc disease. There are bilateral foraminal spurs and mild facet arthropathy. There is mild bilateral neural foraminal stenosis. No spinal stenosis   C6-C7: There is severe degenerative disc disease with a disc bulge. There are small foraminal spurs. The facet joints are normal. There is mild spinal stenosis and mild  bilateral neural foraminal stenosis   C7-T1: Normal   IMPRESSION: 1. Cervical spondylosis 2. There is no significant spinal stenosis.  No disc  herniation 3. There is severe right neural foraminal stenosis at C3-4 due to a foraminal spur and moderate facet arthropathy 4. There is severe right neural foraminal stenosis at C4-5 due to a neural foraminal spur. There is ankylosis of the right facet joint.     Electronically Signed   By: Nancyann Burns M.D.   On: 09/30/2024 10:44   Multiple Myeloma Panel 07/10/2024    03/05/2020 Left Testicular Flow Pathology Report [id number]): 03/05/2020 Left Testicular Flow Pathology Report [id number]):      03/05/2020 FISH Panel:      03/05/2020 Left Testicular Mass Surgical Pathology ([id number]):      03/01/18 CBC w/diff:      RADIOGRAPHIC STUDIES: I have personally reviewed the radiological images as listed and agreed with the findings in the report. MR CERVICAL SPINE W WO CONTRAST Result Date: 09/30/2024 CLINICAL DATA:  Cervical radiculopathy, neck pain EXAM: MRI CERVICAL SPINE WITHOUT AND WITH CONTRAST TECHNIQUE: Multiplanar and multiecho pulse sequences of the cervical spine, to include the craniocervical junction and cervicothoracic junction, were obtained without and with intravenous contrast. CONTRAST:  9mL GADAVIST  GADOBUTROL  1 MMOL/ML IV SOLN COMPARISON:  None Available. FINDINGS: The craniocervical junction is normal. There is no significant bone marrow signal abnormality. The cervical spinal cord is normal. C2-C3: There is mild degenerative disc disease with a broad-based disc bulge that extends more laterally on the right. There is severe right facet arthropathy. There is a moderate right neural foraminal stenosis. No spinal stenosis C3-C4: There is mild degenerative disc disease. There is a prominent right neural foraminal spur causing severe right neural foraminal stenosis. There is moderate right facet arthropathy. C4-C5: There is severe degenerative disc disease. There is a foraminal spur on the right. There is ankylosis of the right facet joint. There is severe  right neural foraminal stenosis. No spinal stenosis C5-C6: There is severe degenerative disc disease. There are bilateral foraminal spurs and mild facet arthropathy. There is mild bilateral neural foraminal stenosis. No spinal stenosis C6-C7: There is severe degenerative disc disease with a disc bulge. There are small foraminal spurs. The facet joints are normal. There is mild spinal stenosis and mild bilateral neural foraminal stenosis C7-T1: Normal IMPRESSION: 1. Cervical spondylosis 2. There is no significant spinal stenosis.  No disc herniation 3. There is severe right neural foraminal stenosis at C3-4 due to a foraminal spur and moderate facet arthropathy 4. There is severe right neural foraminal stenosis at C4-5 due to a neural foraminal spur. There is ankylosis of the right facet joint. Electronically Signed   By: Nancyann Burns M.D.   On: 09/30/2024 10:44    ASSESSMENT & PLAN:  84 y.o. male with  H/o Rt Primary testicular large B cell lymphoma in 2000 treatment with R-CHOP x 6 cycles as part of clinical trial + IT MTX x 4 cycles and RT to left testicle   2 . H/o Relapsed Left Primary testicular large B cell lymphoma 2021 stage I/IIE. Appears to be a new event since it is occurring 20 yrs after his previous rt testicular lymphoma S/p 6 cycles of R-CEOP -- currently in remission.   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             No longer using O2 while at home, only when traveling 3K above sea level. 6. Large B cell lymphoma cutaneous - likely leg type PLAN: -Discussed results from recent skin biopsy of right inferior central tibia,  -results showed large B-cell lymphoma, discussed if this is cutaneous (localized) or if this is systemic relapsed large B-cell  lymphoma -will order labs today and will order LDH labs -will order full-body PET scan to look for other potential findings -discussed referral to radiation oncology for localized regional radiation for the tibial area  -discussed IV chemotherapy for systemic treatment  -discussed options to reduce systemic reoccurance of lymphoma  -will order genetic testing to characterize the type of lymphoma that showed up in the dermantopathology report   -will follow up with continued treatment with scans show systemic findings -discussed follow-up with [facility] to observe and monitor skin changes on the back  Labs today PET/CT in 5-7 days RTC with Dr Onesimo in 2 weeks   FOLLOW-UP in 2 weeks for labs and follow-up with Dr. Onesimo.  The total time spent in the appointment was 60 minutes* .  All of the patient's questions were answered and the patient knows to call the clinic with any problems, questions, or concerns.  Emaline Onesimo MD MS AAHIVMS [facility] Hematology/Oncology Physician [facility]  *Total Encounter Time as defined by the Centers for Medicare and Medicaid Services includes, in addition to the face-to-face time of a patient visit (documented in the note above) non-face-to-face time: obtaining and reviewing outside history, ordering and reviewing medications, tests or procedures, care coordination (communications with other health care professionals or caregivers) and documentation in the medical record.  I, Alan Blowers, acting as a neurosurgeon for Emaline Onesimo, MD.,have documented all relevant documentation on the behalf of Emaline Onesimo, MD,as directed by  Emaline Onesimo, MD while in the presence of Emaline Onesimo, MD.  I have reviewed the above documentation for accuracy and completeness, and I agree with the above.  Emaline Onesimo, MD     [1]  Social History Tobacco Use   Smoking status: Never   Smokeless tobacco: Never  Vaping Use   Vaping status: Never Used   Substance Use Topics   Alcohol use: Yes    Alcohol/week: 14.0 standard drinks of alcohol    Types: 14 Glasses of wine per week    Comment: 2 wine daily   Drug use: Never   "

## 2024-11-01 ENCOUNTER — Telehealth: Payer: Self-pay | Admitting: Hematology

## 2024-11-01 ENCOUNTER — Encounter: Payer: Self-pay | Admitting: Dermatology

## 2024-11-01 LAB — HEPATITIS B CORE ANTIBODY, TOTAL: HEP B CORE AB: NEGATIVE

## 2024-11-01 NOTE — Telephone Encounter (Signed)
 Scheduled patient for 2 week follow-up. Called and left a voicemail with the day and time.

## 2024-11-06 NOTE — Progress Notes (Signed)
 " [facility] - Clinic Note  11/08/2024     CHIEF COMPLAINT Patient presents for Retina Follow Up   HISTORY OF PRESENT ILLNESS: Antonio [name] is a 84 y.o. male who presents to the clinic today for:   HPI     Retina Follow Up   Patient presents with  Dry AMD.  In both eyes.  Severity is moderate.  Duration of 10 months.  Since onset it is stable.        Comments   10 month Retina eval. Patient states vision seems the same      Last edited by German Olam BRAVO, COT on 11/08/2024  8:59 AM.     Pt states he is not having any issues with his vision.   Referring physician: Charlanne Fredia CROME, MD [address] [place],  [place] [id number]  HISTORICAL INFORMATION:   Selected notes from the MEDICAL RECORD NUMBER Self-referral for ARMD OU; Moved from [place], [place]  Ocular Hx - nonexudative ARMD OU; PVD OU; pseudophakia OU (Toric OU - 12/2016 by Dr. Norleen Dee, [place], [place]);  PMH - HTN; [facility] - ARMD - Mother   CURRENT MEDICATIONS: Current Outpatient Medications (Ophthalmic Drugs)  Medication Sig   carboxymethylcellulose (REFRESH PLUS) 0.5 % SOLN Place 1 drop into both eyes daily as needed.   No current facility-administered medications for this visit. (Ophthalmic Drugs)   Current Outpatient Medications (Other)  Medication Sig   Cholecalciferol  (D-3-5) 125 MCG (5000 UT) capsule Take 5,000 Units by mouth daily.   fluticasone  (FLONASE ) 50 MCG/ACT nasal spray SPRAY 2 SPRAYS INTO EACH NOSTRIL EVERY DAY   losartan  (COZAAR ) 100 MG tablet TAKE 1 TABLET BY MOUTH EVERY DAY   meloxicam  (MOBIC ) 7.5 MG tablet TAKE 1 TABLET BY MOUTH EVERY DAY   Multiple Vitamins-Minerals (PRESERVISION AREDS 2 PO) Take 1 capsule by mouth daily.   propranolol  (INDERAL ) 10 MG tablet TAKE 1 TABLET BY MOUTH THREE TIMES A DAY   simvastatin  (ZOCOR ) 40 MG tablet TAKE 1 TABLET BY MOUTH EVERY DAY   Testosterone  20.25 MG/ACT (1.62%) GEL Apply 40.5 mg topically daily.   triamcinolone  cream  (KENALOG ) 0.1 % Apply topically 2 (two) times daily.   valACYclovir  (VALTREX ) 1000 MG tablet Take 1 tablet (1,000 mg total) by mouth 2 (two) times daily. Take 1000 (1 grams) PO 2 times daily for 3 days as needed for flares (herpes)   vitamin B-12 (CYANOCOBALAMIN ) 1000 MCG tablet Take 1,000 mcg by mouth daily.   No current facility-administered medications for this visit. (Other)   REVIEW OF SYSTEMS: ROS   Positive for: Neurological, Genitourinary, Musculoskeletal, Endocrine, Cardiovascular, Eyes, Heme/Lymph Negative for: Constitutional, Gastrointestinal, Skin, HENT, Respiratory, Psychiatric, Allergic/Imm Last edited by German Olam BRAVO, COT on 11/08/2024  8:57 AM.         ALLERGIES Allergies  Allergen Reactions   Latex Itching    Latex tape causes itching and redness.    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   ED (erectile dysfunction)    Elevated diaphragm    right   Erectile dysfunction    History of basal cell carcinoma (BCC) excision    multiple  excision in office   History of kidney stones    History of seizure    03-03-2020 per pt x1 while taking interferan for melanoma in 2001 ,  none since, told a side effect of interferan   History of squamous cell carcinoma excision    multiple skin excision in office   Hyperlipidemia    Hypertension    followed by pcp  (03-03-2020 per pt had a stress test approx. 2010, told normal)   LBBB (left bundle branch block)    Lymphoma of testis (HCC) 2000   s/p right orchiectomy    Mass of left testis    MGUS (monoclonal gammopathy of unknown significance)    oncologist--- dr onesimo   Mixed  hyperlipidemia 10/26/2020   Personal history of malignant melanoma of skin dermatologist--- dr rolan molt   dx 2001 left calf area  s/p WLE ,  completed one year interferan (03-03-2020 pt states did not involve lymph nodes, no recurrence)   Urgency of urination    Wears hearing aid in both ears    Past Surgical History:  Procedure Laterality Date   CATARACT EXTRACTION W/ INTRAOCULAR LENS IMPLANT Bilateral 2018   IR IMAGING GUIDED PORT INSERTION  03/31/2020   MELANOMA EXCISION  10/1999   ORCHIECTOMY Right 2000   ORCHIECTOMY Left 03/05/2020   Procedure: ORCHIECTOMY;  Surgeon: Watt Rush, MD;  Location: [facility];  Service: Urology;  Laterality: Left;   TONSILLECTOMY  child   TOTAL KNEE ARTHROPLASTY Right 06/2009   FAMILY HISTORY Family History  Problem Relation Age of Onset   Cataracts Mother    Transient ischemic attack Mother    Asthma Mother    Cataracts Father    Diabetes Father        borderline   Hypertension Father    Hyperlipidemia Father    Hyperlipidemia Son    Hypertension Son    Amblyopia Neg Hx    Blindness Neg Hx    Glaucoma Neg Hx    Macular degeneration Neg Hx    Retinal detachment Neg Hx    Strabismus Neg Hx    Retinitis pigmentosa Neg Hx    SOCIAL HISTORY Social History   Tobacco Use   Smoking status: Never   Smokeless tobacco: Never  Vaping Use   Vaping status: Never Used  Substance Use Topics   Alcohol use: Yes    Alcohol/week: 14.0 standard drinks of alcohol    Types: 14 Glasses of wine per week    Comment: 2 wine daily   Drug use: Never       OPHTHALMIC EXAM: Base Eye Exam     Visual Acuity (Snellen - Linear)       Right Left   Dist cc 20/20 20/20 -2    Correction: Glasses         Tonometry (Tonopen, 8:58 AM)       Right Left   Pressure 11 10         Pupils       Dark Light Shape React APD   Right 3 2 Round Brisk None   Left 3 2 Round Brisk None         Visual Fields (Counting fingers)       Left  Right    Full Full         Extraocular Movement       Right Left    Full, Ortho Full, Ortho         Neuro/Psych  Oriented x3: Yes   Mood/Affect: Normal         Dilation     Both eyes: 1.0% Mydriacyl, 2.5% Phenylephrine @ 8:58 AM           Slit Lamp and Fundus Exam     External Exam       Right Left   External Brow ptosis - mild Brow ptosis - mild         Slit Lamp Exam       Right Left   Lids/Lashes Dermatochalasis - upper lid Dermatochalasis - upper lid   Conjunctiva/Sclera White and quiet White and quiet   Cornea Arcus, Well healed temporal cataract wounds, trace PEE Arcus, Well healed cataract wounds, trace PEE   Anterior Chamber deep and clear deep and clear   Iris Round and dilated Round and dilated   Lens toric Posterior chamber intraocular lens in good postion with marks at 0300 and 0900, trace Posterior capsular opacification Toric Posterior chamber intraocular lens in good position with marks at 0200 and 0800; 1+Posterior capsular opacification   Anterior Vitreous Vitreous syneresis, Posterior vitreous detachment, vitreous condensations Vitreous syneresis, Posterior vitreous detachment, vitreous condensations         Fundus Exam       Right Left   Disc Mild pallor, sharp rim, Compact, Tilted with Peripapillary atrophy 360 compact, mild tilt, Peripapillary atrophy 360, mild Pallor, Sharp rim   C/D Ratio 0.4 0.3   Macula Flat, Blunted foveal reflex, Drusen, RPE mottling, clumping and atrophy - stable and mild, No heme or edema, focal atrophy Good foveal reflex, Drusen, RPE mottling, clumping and atrophy - stable and mild, No heme or edema, focal atrophy   Vessels attenuated, mild tortuosity attenuated, mild tortuosity   Periphery Attached; scattered Reticular degeneration, No heme Attached; scattered Reticular degeneration, No heme           Refraction     Wearing Rx       Sphere Cylinder Axis Add   Right -0.75 +0.50 106 +2.50    Left -0.50 +0.75 121 +2.50    Type: PAL            IMAGING AND PROCEDURES  Imaging and Procedures for 11/10/17  OCT, Retina - OU - Both Eyes       Right Eye Quality was good. Central Foveal Thickness: 317. Progression has been stable. Findings include normal foveal contour, no IRF, no SRF, myopic contour, retinal drusen , outer retinal atrophy (Stable focal areas of outer retinal atrophy / ellipsoid dropout; scattered drusen).   Left Eye Quality was good. Central Foveal Thickness: 323. Progression has been stable. Findings include normal foveal contour, no IRF, no SRF, myopic contour, retinal drusen , outer retinal atrophy (Scattered drusen).   Notes Images captured and stored on drive  Diagnosis / Impression:  Stable nonexudative ARMD OU - no significant progression from prior  Clinical management:  See below  Abbreviations: NFP - Normal foveal profile. CME - cystoid macular edema. PED - pigment epithelial detachment. IRF - intraretinal fluid. SRF - subretinal fluid. EZ - ellipsoid zone. ERM - epiretinal membrane. ORA - outer retinal atrophy. ORT - outer retinal tubulation. SRHM - subretinal hyper-reflective material               ASSESSMENT/PLAN:    ICD-10-CM   1. Intermediate stage nonexudative age-related macular degeneration of both eyes  H35.3132 OCT, Retina - OU - Both Eyes    2. Pseudophakia of both eyes  Z96.1  3. 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 Bilateral posterior capsular opacification  H26.493      1. Age related macular degeneration, non-exudative, both eyes   - intermediate stage -- stable - BCVA 20/20 OU - no longer using Foresee AMD monitoring device due to insurance issues - OCT shows OD: scattered drusen, stable focal areas of outer retinal atrophy / ellipsoid dropout, no fluid, OS: scattered drusen, no fluid  - f/u 12 months, sooner PRN -- DFE/OCT  2. Pseudophakia OU  - s/p CE/PCIOL OU (Toric + femto) 12/2016 by Dr. Norleen Dee in [facility]  - had post op IOP issue OS  - beautiful surgeries, doing well  - monitor   3. H/o ocular hypertension OU  - IOP good today (11,10) off combigan  - monitor   4. PCO OU  - not yet visually significant  - monitor for now  Ophthalmic Meds Ordered this visit:  No orders of the defined types were placed in this encounter.    No follow-ups on file.  There are no Patient Instructions on file for this visit.  This document serves as a record of services personally performed by Redell JUDITHANN Hans, MD, PhD. It was created on their behalf by Delon Newness COT, an ophthalmic technician. The creation of this record is the provider's dictation and/or activities during the visit.    Electronically signed by: Delon Newness COT 01.23.26  9:00 AM  This document serves as a record of services personally performed by Redell JUDITHANN Hans, MD, PhD. It was created on their behalf by Wanda GEANNIE Keens, COT an ophthalmic technician. The creation of this record is the provider's dictation and/or activities during the visit.    Electronically signed by:  Wanda GEANNIE Keens, COT  11/08/24 9:00 AM  Abbreviations: M myopia (nearsighted); A astigmatism; H hyperopia (farsighted); P presbyopia; Mrx spectacle prescription;  CTL contact lenses; OD right eye; OS left eye; OU both eyes  XT exotropia; ET esotropia; PEK punctate epithelial keratitis; PEE punctate epithelial erosions; DES dry eye syndrome; MGD meibomian gland dysfunction; ATs artificial tears; PFAT's preservative free artificial tears; NSC nuclear sclerotic cataract; PSC posterior subcapsular cataract; ERM epi-retinal membrane; PVD posterior vitreous detachment; RD retinal detachment; DM diabetes mellitus; DR diabetic retinopathy; NPDR non-proliferative diabetic retinopathy; PDR proliferative diabetic retinopathy; CSME clinically significant macular edema; DME diabetic macular edema; dbh dot blot hemorrhages; CWS cotton wool spot;  POAG primary open angle glaucoma; C/D cup-to-disc ratio; HVF humphrey visual field; GVF goldmann visual field; OCT optical coherence tomography; IOP intraocular pressure; BRVO Branch retinal vein occlusion; CRVO central retinal vein occlusion; CRAO central retinal artery occlusion; BRAO branch retinal artery occlusion; RT retinal tear; SB scleral buckle; PPV pars plana vitrectomy; VH Vitreous hemorrhage; PRP panretinal laser photocoagulation; IVK intravitreal kenalog ; VMT vitreomacular traction; MH Macular hole;  NVD neovascularization of the disc; NVE neovascularization elsewhere; AREDS age related eye disease study; ARMD age related macular degeneration; POAG primary open angle glaucoma; EBMD epithelial/anterior basement membrane dystrophy; ACIOL anterior chamber intraocular lens; IOL intraocular lens; PCIOL posterior chamber intraocular lens; Phaco/IOL phacoemulsification with intraocular lens placement; PRK photorefractive keratectomy; LASIK laser assisted in situ keratomileusis; HTN hypertension; DM diabetes mellitus; COPD chronic obstructive pulmonary disease "

## 2024-11-08 ENCOUNTER — Ambulatory Visit (INDEPENDENT_AMBULATORY_CARE_PROVIDER_SITE_OTHER): Admitting: Ophthalmology

## 2024-11-08 ENCOUNTER — Encounter (INDEPENDENT_AMBULATORY_CARE_PROVIDER_SITE_OTHER): Payer: Self-pay | Admitting: Ophthalmology

## 2024-11-08 DIAGNOSIS — H40053 Ocular hypertension, bilateral: Secondary | ICD-10-CM | POA: Diagnosis not present

## 2024-11-08 DIAGNOSIS — Z961 Presence of intraocular lens: Secondary | ICD-10-CM | POA: Diagnosis not present

## 2024-11-08 DIAGNOSIS — H26493 Other secondary cataract, bilateral: Secondary | ICD-10-CM

## 2024-11-08 DIAGNOSIS — H353132 Nonexudative age-related macular degeneration, bilateral, intermediate dry stage: Secondary | ICD-10-CM | POA: Diagnosis not present

## 2024-11-11 ENCOUNTER — Ambulatory Visit: Admitting: Pulmonary Disease

## 2024-11-14 ENCOUNTER — Inpatient Hospital Stay: Admitting: Hematology

## 2024-11-14 ENCOUNTER — Encounter (INDEPENDENT_AMBULATORY_CARE_PROVIDER_SITE_OTHER): Payer: Self-pay | Admitting: Ophthalmology

## 2024-11-15 ENCOUNTER — Encounter (HOSPITAL_COMMUNITY)
Admission: RE | Admit: 2024-11-15 | Discharge: 2024-11-15 | Disposition: A | Source: Ambulatory Visit | Attending: Hematology

## 2024-11-15 ENCOUNTER — Other Ambulatory Visit: Payer: Self-pay | Admitting: Hematology

## 2024-11-15 DIAGNOSIS — C8335 Diffuse large B-cell lymphoma, lymph nodes of inguinal region and lower limb: Secondary | ICD-10-CM | POA: Diagnosis present

## 2024-11-15 LAB — GLUCOSE, CAPILLARY: Glucose-Capillary: 105 mg/dL — ABNORMAL HIGH (ref 70–99)

## 2024-11-15 MED ORDER — FLUDEOXYGLUCOSE F - 18 (FDG) INJECTION
9.1500 | Freq: Once | INTRAVENOUS | Status: AC
  Administered 2024-11-15: 9.13 via INTRAVENOUS

## 2024-11-19 ENCOUNTER — Other Ambulatory Visit: Payer: Self-pay | Admitting: Internal Medicine

## 2024-11-22 ENCOUNTER — Inpatient Hospital Stay: Attending: Hematology | Admitting: Hematology

## 2024-11-22 VITALS — BP 141/76 | HR 84 | Temp 97.9°F | Resp 17 | Ht 72.0 in | Wt 183.6 lb

## 2024-11-22 DIAGNOSIS — C84A5 Cutaneous T-cell lymphoma, unspecified, lymph nodes of inguinal region and lower limb: Secondary | ICD-10-CM

## 2024-11-22 NOTE — Progress Notes (Incomplete)
 " HEMATOLOGY ONCOLOGY PROGRESS NOTE  Date of service: 11/22/2024  Patient Care Team: Charlanne Fredia CROME, MD as PCP - General (Internal Medicine)  CHIEF COMPLAINT/PURPOSE OF CONSULTATION: Follow-up for continued evaluation and management of newly diagnosed cutaneous large B-cell lymphoma.  HISTORY OF PRESENTING ILLNESS: Antonio [name] is a wonderful 84 y.o. male who has been referred to us  by Dr. Annabella Rase for evaluation and management of Bone lesion of left lower leg. The pt reports that he is doing well overall.    The pt reports that he cannot feel the ovoid finding from his 04/09/18 MRI as noted below. He denies being able to feel anything in his skin as well. He notes that he received a needle injection in his knee prior to this MRI. The pt notes that his knee has lost much cartilage and is considering a knee replacement. He denies any recent injuries to his knee or falls. He has contacted Dr. Alm Angle in surgery and another surgeon as well and will be making an appointment.    The pt notes that he does not feel any differently recently as compared to 6 months to a year ago. He does note that he normally has constipation in the spring, and has continued to have mild constipation. He denies any other concerns or symptoms.    The pt previously saw my colleague Dr. Sandria Gell for primary testicular B-cell lymphoma in 2000. The pt notes that the involvement was limited to only one testicle. He received 6 intrathecal treatments in his spine, and received 4 cycles of chemotherapy (thinks this was CHOP) and radiation as well. The pt was followed by an oncologist for several years at the [facility]  in [place] after moving from [place] after his lymphoma treatment.    The pt notes that he was first diagnosed with melanoma in October 2000. He noticed a strange spot on his left leg that was surgically resected. The pt's personal notes report a 2.9cm thick, level 4,  no LN involvement, high risk stage II, treated with Interferon three times a week for one year. He notes some squamous cell and basal cell involvements as well and sees a dermatologist, Dr. Rolan Molt, regularly.    Of note prior to the patient's visit today, pt has had MRI Left Knee completed on 04/09/18 with results revealing An ovoid, lobulated, 10mm T2 hyperintense focus within the medial subcutaneous far 1.41mm distal to the joint demonstrates demonstrates nonspecific imaging characteristics.    Most recent lab results (03/01/18) of CBC w/diff is as follows: all values are WNL except for RBC at 4.54, MCV at 100.4, MCH at 33.8.    On review of systems, pt reports left knee pain, mild constipation, and denies new or concerning symptoms, noticing any new lumps or bumps, pain along the spine, abdominal pains, problems passing urine, testicular pain/swelling, and any other symptoms.      SUMMARY OF ONCOLOGIC HISTORY: Oncology History  Diffuse large B cell lymphoma (HCC)  03/17/2020 Initial Diagnosis   Diffuse large B cell lymphoma (HCC)   03/25/2020 - 07/13/2020 Chemotherapy   Patient is on Treatment Plan : NON-HODGKIN'S LYMPHOMA R-CEOP Q21D     Testicular malignant neoplasm (HCC)  03/17/2020 Initial Diagnosis   Testicular malignant neoplasm (HCC)   03/25/2020 - 07/13/2020 Chemotherapy   Patient is on Treatment Plan : NON-HODGKIN'S LYMPHOMA R-CEOP Q21D       INTERVAL HISTORY: Antonio [name] is a 84 y.o. male who is here  today for continued evaluation and management of recurrent testicular large B-cell lymphoma. He is accompanied by his wife and daughter today.   he was last seen by me on 10/31/2024; at the time he did not have any concerns and was doing well.   Today, he reported the lymphoma lesion on his tibia is still visible but does not swell.  REVIEW OF SYSTEMS:   10 Point review of systems of done and is negative except as noted above.  MEDICAL HISTORY Past Medical History:  Diagnosis  Date   Age-related macular degeneration, dry, both eyes    B-cell lymphoma [facility]) oncologist--- dr onesimo   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   ED (erectile dysfunction)    Elevated diaphragm    right   Erectile dysfunction    History of basal cell carcinoma (BCC) excision    multiple excision in office   History of kidney stones    History of seizure    03-03-2020 per pt x1 while taking interferan for melanoma in 2001 ,  none since, told a side effect of interferan   History of squamous cell carcinoma excision    multiple skin excision in office   Hyperlipidemia    Hypertension    followed by pcp  (03-03-2020 per pt had a stress test approx. 2010, told normal)   LBBB (left bundle branch block)    Lymphoma of testis (HCC) 2000   s/p right orchiectomy    Mass of left testis    MGUS (monoclonal gammopathy of unknown significance)    oncologist--- dr onesimo   Mixed hyperlipidemia 10/26/2020   Personal history of malignant melanoma of skin dermatologist--- dr rolan molt   dx 2001 left calf area  s/p WLE ,  completed one year interferan (03-03-2020 pt states did not involve lymph nodes, no recurrence)   Urgency of urination    Wears hearing aid in both ears     SURGICAL HISTORY Past Surgical History:  Procedure Laterality Date   CATARACT EXTRACTION W/ INTRAOCULAR LENS IMPLANT Bilateral 2018   IR IMAGING GUIDED PORT INSERTION  03/31/2020   MELANOMA EXCISION  10/1999   ORCHIECTOMY Right 2000   ORCHIECTOMY Left 03/05/2020   Procedure: ORCHIECTOMY;  Surgeon: Watt Rush, MD;  Location: [facility];  Service: Urology;  Laterality: Left;   TONSILLECTOMY  child   TOTAL KNEE ARTHROPLASTY Right 06/2009    SOCIAL HISTORY Social History[1]  Social History   Social History Narrative    Social History      Diet?good      Do you drink/eat things with caffeine? seldom      Marital status?          yes                          What year were you married? 1962      Do you live in a house, apartment, assisted living, condo, trailer, etc.? house      Is it one or more stories?1      How many persons live in your home? 2      Do you have any pets in your home? (please list) no      Highest level of education completed? college      Current  or past profession: president and CEO of [employer]      Advanced Directives      Do you exercise?   yes                                   Type & how often? Garden work, therapist, music care      Do you have a living will? yes      Do you have a DNR form?       yes                            If not, do you want to discuss one?      Do you have signed POA/HPOA for forms?  yes      Functional Status      Do you have difficulty bathing or dressing yourself? no      Do you have difficulty preparing food or eating? no      Do you have difficulty managing your medications? no      Do you have difficulty managing your finances? no      Do you have difficulty affording your medications? no    SOCIAL DRIVERS OF HEALTH SDOH Screenings   Food Insecurity: Unknown (01/19/2024)  Housing: Low Risk (01/19/2024)  Transportation Needs: No Transportation Needs (11/22/2024)  Utilities: Not At Risk (07/14/2022)  Alcohol Screen: Low Risk (01/19/2024)  Depression (PHQ2-9): Low Risk (11/22/2024)  Financial Resource Strain: Low Risk (01/19/2024)  Physical Activity: Unknown (01/19/2024)  Social Connections: Unknown (01/19/2024)  Stress: No Stress Concern Present (01/19/2024)  Tobacco Use: Low Risk (11/14/2024)     FAMILY HISTORY Family History  Problem Relation Age of Onset   Cataracts Mother    Transient ischemic attack Mother    Asthma Mother    Cataracts Father    Diabetes Father        borderline   Hypertension Father    Hyperlipidemia Father     Hyperlipidemia Son    Hypertension Son    Amblyopia Neg Hx    Blindness Neg Hx    Glaucoma Neg Hx    Macular degeneration Neg Hx    Retinal detachment Neg Hx    Strabismus Neg Hx    Retinitis pigmentosa Neg Hx      ALLERGIES: is allergic to latex.  MEDICATIONS  Current Outpatient Medications  Medication Sig Dispense Refill   carboxymethylcellulose (REFRESH PLUS) 0.5 % SOLN Place 1 drop into both eyes daily as needed.     Cholecalciferol  (D-3-5) 125 MCG (5000 UT) capsule Take 5,000 Units by mouth daily.     fluticasone  (FLONASE ) 50 MCG/ACT nasal spray SPRAY 2 SPRAYS INTO EACH NOSTRIL EVERY DAY 48 mL 4   losartan  (COZAAR ) 100 MG tablet TAKE 1 TABLET BY MOUTH EVERY DAY 90 tablet 1   meloxicam  (MOBIC ) 7.5 MG tablet TAKE 1 TABLET BY MOUTH EVERY DAY 90 tablet 1   Multiple Vitamins-Minerals (PRESERVISION AREDS 2 PO) Take 1 capsule by mouth daily.     propranolol  (INDERAL ) 10 MG tablet TAKE 1 TABLET BY MOUTH THREE TIMES A DAY 270 tablet 1   simvastatin  (ZOCOR ) 40 MG tablet TAKE 1 TABLET BY MOUTH EVERY DAY 90 tablet 1   Testosterone  20.25 MG/ACT (1.62%) GEL Apply 40.5 mg topically daily.     triamcinolone  cream (KENALOG ) 0.1 % Apply topically 2 (two) times daily. 30 g  1   valACYclovir  (VALTREX ) 1000 MG tablet Take 1 tablet (1,000 mg total) by mouth 2 (two) times daily. Take 1000 (1 grams) PO 2 times daily for 3 days as needed for flares (herpes) 30 tablet 3   vitamin B-12 (CYANOCOBALAMIN ) 1000 MCG tablet Take 1,000 mcg by mouth daily.     No current facility-administered medications for this visit.    PHYSICAL EXAMINATION: ECOG PERFORMANCE STATUS: 1 - Symptomatic but completely ambulatory VITALS: Vitals:   11/22/24 1146 11/22/24 1148  BP: (!) 159/87 (!) 141/76  Pulse: 84   Resp: 17   Temp: 97.9 F (36.6 C)   SpO2: 97%    Filed Weights   11/22/24 1146  Weight: 183 lb 9.6 oz (83.3 kg)   Body mass index is 24.9 kg/m.  GENERAL: alert, in no acute distress and comfortable SKIN:  no acute rashes, no significant lesions EYES: conjunctiva are pink and non-injected, sclera anicteric OROPHARYNX: MMM, no exudates, no oropharyngeal erythema or ulceration NECK: supple, no JVD LYMPH:  no palpable lymphadenopathy in the cervical, axillary or inguinal regions LUNGS: clear to auscultation b/l with normal respiratory effort HEART: regular rate & rhythm ABDOMEN:  normoactive bowel sounds , non tender, not distended, no hepatosplenomegaly Extremity: no pedal edema PSYCH: alert & oriented x 3 with fluent speech NEURO: no focal motor/sensory deficits  LABORATORY DATA:   I have reviewed the data as listed     Latest Ref Rng & Units 10/31/2024   11:29 AM 07/10/2024   10:13 AM 12/15/2023   11:21 AM  CBC EXTENDED  WBC 4.0 - 10.5 K/uL 9.1  6.5  6.9   RBC 4.22 - 5.81 MIL/uL 4.93  4.89  4.64   Hemoglobin 13.0 - 17.0 g/dL 82.5  83.4  84.1   HCT 39.0 - 52.0 % 50.8  50.4  48.0   Platelets 150 - 400 K/uL 213  200  197   NEUT# 1.7 - 7.7 K/uL 5.7  4.0  4.2   Lymph# 0.7 - 4.0 K/uL 1.7  1.5  1.6        Latest Ref Rng & Units 10/31/2024   11:29 AM 07/10/2024   10:13 AM 12/15/2023   11:21 AM  CMP  Glucose 70 - 99 mg/dL 77  96  82   BUN 8 - 23 mg/dL 18  14  17    Creatinine 0.61 - 1.24 mg/dL 9.02  9.11  9.23   Sodium 135 - 145 mmol/L 141  143  141   Potassium 3.5 - 5.1 mmol/L 4.1  4.5  4.4   Chloride 98 - 111 mmol/L 101  104  103   CO2 22 - 32 mmol/L 31  36  32   Calcium 8.9 - 10.3 mg/dL 9.7  9.1  9.5   Total Protein 6.5 - 8.1 g/dL 7.6  6.9  6.6   Total Bilirubin 0.0 - 1.2 mg/dL 1.0  1.0  1.1   Alkaline Phos 38 - 126 U/L 69  55  58   AST 15 - 41 U/L 26  29  23    ALT 0 - 44 U/L 29  38  35     FISH Analysis [employer]    MYC (Bq24) rearrangement NOT DETECTED 34F signal pattern with no abnormalities identified MYC /IgH/CEN8t (8:14) NOT DETECTED 2R2GZA signal pattern with no abnormalities identified     Report of Dermatopathology   Diagnosis: Skin Biopsy (P), left superior  central forehead Shave & ED&C Squamous cell carcinoma in situ arisin  gin a seborrheic keratosis Skin Biopsy - (P) (B) right inferior central anterior tibial shave B-cell lymphoma with aggressive features. See note   NOTE: THERE IS A DIFFUSE DERMAL INFILTRATE WITH A GRENZ ZONE CONSISTING OF PREDOMINATLY MODERATE TO LARGE SIZED ATYPICAL CELLS THAT ARE POSITIVE FOR THE B-CELL MARKERS CD20, CD79A, AND PAX5. CD10 APPEARS POSITIVE IN A SMALL SUBSET BUT THIS IS IN THE PACKGROUND OF EXTENSIVE STROMAL STAINING THAT MAKES INTERPRETATION DIFFICULT. BCL6 IS ALSO POSITIVE IN THE LARGE SUBSET AS IS MUM1. THE CELLS APPEAR STRONGLY POSITIVE FOR BCL-2. THE PROLIFERATION RATE BY KI-67 IS HIGH AT APPROXIMATELY 70% CD30, MPO, CD117 AND CYCLIN D1. A FOLLICULAR  DENDRITIC CELL MESHWORK IS NOT PRESENT BY CD21. THE OVERALL FINDINGS ARE COSISTENT FOR AN AGGRESSIVE B-CALL LYMPHOMA AND MOST LIKELY REPRESENTS A LARGE B-CALL LYMPHOMA. GIVEN THE LOCATION A PRIMARY DIFFUSE LARGE B-CELL LYMPHOMA, LEG TYPE IS HIGH IN THE DIFFERENTIAL BUT CORRELATION FOR SYSTEMIC INVOLVEMENT (I.E. CLINICAL/RADIOLOGICAL CORRELATION) MUSCULUS BE DETERMINED. IN ADDITION GIVEN THE HIGH PROLIFERATION RATE A FISH PANEL TO RULE OUT A HIGH-GRADE B0CELL LYMPHOMA (I.E. DOUBLE HIT.TRIPLE HIT LYMPHOMA) IS PENDING AND WILL BE REPORTED IN AN ADDENDUM.        MR Cervical Spine W WO Contrast 09/30/2024   Narrative & Impression  CLINICAL DATA:  Cervical radiculopathy, neck pain   EXAM: MRI CERVICAL SPINE WITHOUT AND WITH CONTRAST   TECHNIQUE: Multiplanar and multiecho pulse sequences of the cervical spine, to include the craniocervical junction and cervicothoracic junction, were obtained without and with intravenous contrast.   CONTRAST:  9mL GADAVIST  GADOBUTROL  1 MMOL/ML IV SOLN   COMPARISON:  None Available.   FINDINGS: The craniocervical junction is normal.   There is no significant bone marrow signal abnormality.   The cervical spinal cord is  normal.   C2-C3: There is mild degenerative disc disease with a broad-based disc bulge that extends more laterally on the right. There is severe right facet arthropathy. There is a moderate right neural foraminal stenosis. No spinal stenosis   C3-C4: There is mild degenerative disc disease. There is a prominent right neural foraminal spur causing severe right neural foraminal stenosis. There is moderate right facet arthropathy.   C4-C5: There is severe degenerative disc disease. There is a foraminal spur on the right. There is ankylosis of the right facet joint. There is severe right neural foraminal stenosis. No spinal stenosis   C5-C6: There is severe degenerative disc disease. There are bilateral foraminal spurs and mild facet arthropathy. There is mild bilateral neural foraminal stenosis. No spinal stenosis   C6-C7: There is severe degenerative disc disease with a disc bulge. There are small foraminal spurs. The facet joints are normal. There is mild spinal stenosis and mild bilateral neural foraminal stenosis   C7-T1: Normal   IMPRESSION: 1. Cervical spondylosis 2. There is no significant spinal stenosis.  No disc herniation 3. There is severe right neural foraminal stenosis at C3-4 due to a foraminal spur and moderate facet arthropathy 4. There is severe right neural foraminal stenosis at C4-5 due to a neural foraminal spur. There is ankylosis of the right facet joint.     Electronically Signed   By: Nancyann Burns M.D.   On: 09/30/2024 10:44    Multiple Myeloma Panel 07/10/2024      03/05/2020 Left Testicular Flow Pathology Report [id number]): 03/05/2020 Left Testicular Flow Pathology Report [id number]):      03/05/2020 FISH Panel:      03/05/2020 Left Testicular Mass Surgical Pathology ([id number]):  03/01/18 CBC w/diff:       RADIOGRAPHIC STUDIES: I have personally reviewed the radiological images as listed and agreed with the  findings in the report. NM PET Image Initial (PI) Whole Body (F-18 FDG) Result Date: 11/19/2024 EXAM: PET CT WHOLE BODY 11/15/2024 12:53:30 PM TECHNIQUE: RADIOPHARMACEUTICAL: 9.13 mCi F-18 FDG Uptake time 60 minutes. Glucose level 105 mg/dl. PET imaging was acquired from the skull vertex through the feet. Non-contrast enhanced computed tomography was obtained for attenuation correction and anatomic localization. COMPARISON: None available. CLINICAL HISTORY: Hematologic malignancy, staging; Initial staging of newly diagnosed diffuse large B cell lymphoma of the leg. Diffuse large B-cell lymphoma of lymph nodes of lo. FINDINGS: HEAD AND NECK: No metabolically active cervical lymphadenopathy. CHEST: Mild interstitial thickening in the lungs without nodularity. No thoracic lymphadenopathy. ABDOMEN AND PELVIS: The spleen is normal volume. No abdominal or pelvic lymphadenopathy. No inguinal lymphadenopathy. Physiologic activity within the gastrointestinal and genitourinary systems. BONES AND SOFT TISSUE: Along the anterior margin of the right lower extremity tibia, there is hypermetabolic activity with SUV max equals 10.9. There is misregistration between the PET and CT. On the CT, subtle skin thickening just above the ankle joint. On image 400. No metabolically active aggressive osseous lesion. IMPRESSION: 1. Hypermetabolic activity along the anterior margin of the right lower extremity tibia with subtle skin thickening just above the ankle joint on CT (image 400), likely related to the patient's known diffuse large B-cell lymphoma,. 2. No other metabolically active disease identified. Electronically signed by: Norleen Boxer MD 11/19/2024 10:05 AM EST RP Workstation: [id number]   OCT, Retina - OU - Both Eyes Result Date: 11/14/2024 Right Eye Quality was good. Central Foveal Thickness: 313. Progression has been stable. Findings include normal foveal contour, no IRF, no SRF, myopic contour, retinal drusen , outer  retinal atrophy (scattered drusen, stable focal areas of outer retinal atrophy / ellipsoid dropout, no fluid). Left Eye Quality was good. Central Foveal Thickness: 321. Progression has been stable. Findings include normal foveal contour, no IRF, no SRF, myopic contour, retinal drusen , outer retinal atrophy (Scattered drusen, no fluid). Notes Images captured and stored on drive Diagnosis / Impression: OD: scattered drusen, stable focal areas of outer retinal atrophy / ellipsoid dropout, no fluid OS: scattered drusen, no fluid Clinical management: See below Abbreviations: NFP - Normal foveal profile. CME - cystoid macular edema. PED - pigment epithelial detachment. IRF - intraretinal fluid. SRF - subretinal fluid. EZ - ellipsoid zone. ERM - epiretinal membrane. ORA - outer retinal atrophy. ORT - outer retinal tubulation. SRHM - subretinal hyper-reflective material   MR CERVICAL SPINE W WO CONTRAST Result Date: 09/30/2024 CLINICAL DATA:  Cervical radiculopathy, neck pain EXAM: MRI CERVICAL SPINE WITHOUT AND WITH CONTRAST TECHNIQUE: Multiplanar and multiecho pulse sequences of the cervical spine, to include the craniocervical junction and cervicothoracic junction, were obtained without and with intravenous contrast. CONTRAST:  9mL GADAVIST  GADOBUTROL  1 MMOL/ML IV SOLN COMPARISON:  None Available. FINDINGS: The craniocervical junction is normal. There is no significant bone marrow signal abnormality. The cervical spinal cord is normal. C2-C3: There is mild degenerative disc disease with a broad-based disc bulge that extends more laterally on the right. There is severe right facet arthropathy. There is a moderate right neural foraminal stenosis. No spinal stenosis C3-C4: There is mild degenerative disc disease. There is a prominent right neural foraminal spur causing severe right neural foraminal stenosis. There is moderate right facet arthropathy. C4-C5: There is severe degenerative disc disease. There is a  foraminal  spur on the right. There is ankylosis of the right facet joint. There is severe right neural foraminal stenosis. No spinal stenosis C5-C6: There is severe degenerative disc disease. There are bilateral foraminal spurs and mild facet arthropathy. There is mild bilateral neural foraminal stenosis. No spinal stenosis C6-C7: There is severe degenerative disc disease with a disc bulge. There are small foraminal spurs. The facet joints are normal. There is mild spinal stenosis and mild bilateral neural foraminal stenosis C7-T1: Normal IMPRESSION: 1. Cervical spondylosis 2. There is no significant spinal stenosis.  No disc herniation 3. There is severe right neural foraminal stenosis at C3-4 due to a foraminal spur and moderate facet arthropathy 4. There is severe right neural foraminal stenosis at C4-5 due to a neural foraminal spur. There is ankylosis of the right facet joint. Electronically Signed   By: Nancyann Burns M.D.   On: 09/30/2024 10:44    ASSESSMENT & PLAN:  84 y.o. male with  H/o Rt Primary testicular large B cell lymphoma in 2000 treatment with R-CHOP x 6 cycles as part of clinical trial + IT MTX x 4 cycles and RT to left testicle   2 . H/o Relapsed Left Primary testicular large B cell lymphoma 2021 stage I/IIE. Appears to be a new event since it is occurring 20 yrs after his previous rt testicular lymphoma S/p 6 cycles of R-CEOP -- currently in remission.   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             No longer using O2 while at home, only when traveling 3K above sea level. 6. Large B cell lymphoma cutaneous - likely leg type  PLAN: - Discussed lab results on 11/22/2024 in detail with patient: -CBC and CMP is stable and wnl -Hemoglobin is higher due to  testosterone  replacement -LDH 180 -PET/CT scan revealed a spot on the tibia, but it has not progressed and it is not metabolically active disease  -discussed this is an acute leg type lymphoma  -discussed this is generally treated with pola RCHP and local radiation -recommended that we move forward with local radiation at this time -discussed importance of PCP monitoring testosterone  levels  -will refer to radiation oncology -we will repeat PET/CT scan in 6 months to one year  FOLLOW-UP    The total time spent in the appointment was *** minutes* .  All of the patient's questions were answered and the patient knows to call the clinic with any problems, questions, or concerns.  Emaline Saran MD MS AAHIVMS [facility] Hematology/Oncology Physician [facility]  *Total Encounter Time as defined by the Centers for Medicare and Medicaid Services includes, in addition to the face-to-face time of a patient visit (documented in the note above) non-face-to-face time: obtaining and reviewing outside history, ordering and reviewing medications, tests or procedures, care coordination (communications with other health care professionals or caregivers) and documentation in the medical record.  I, Alan Blowers, acting as a neurosurgeon for Emaline Saran, MD.,have documented all relevant documentation on the behalf of Emaline Saran, MD,as directed by  Emaline Saran, MD while in the presence of Emaline Saran, MD.  I have reviewed the above documentation for accuracy and completeness, and I agree with the above.  Gautam Kale,  MD    [1]  Social History Tobacco Use   Smoking status: Never   Smokeless tobacco: Never  Vaping Use   Vaping status: Never Used  Substance Use Topics   Alcohol use: Yes    Alcohol/week: 14.0 standard drinks of alcohol    Types: 14 Glasses of wine per week    Comment: 2 wine daily   Drug use: Never   "

## 2024-11-25 ENCOUNTER — Ambulatory Visit: Admitting: Radiation Oncology

## 2024-11-25 ENCOUNTER — Ambulatory Visit

## 2024-11-25 ENCOUNTER — Encounter: Admitting: Adult Health

## 2024-12-09 ENCOUNTER — Ambulatory Visit: Admitting: Pulmonary Disease

## 2024-12-17 ENCOUNTER — Encounter: Admitting: Internal Medicine

## 2024-12-19 ENCOUNTER — Ambulatory Visit: Admitting: Diagnostic Neuroimaging

## 2025-01-21 ENCOUNTER — Inpatient Hospital Stay: Attending: Hematology

## 2025-01-21 ENCOUNTER — Inpatient Hospital Stay: Admitting: Hematology

## 2025-07-16 ENCOUNTER — Ambulatory Visit: Admitting: Hematology

## 2025-07-16 ENCOUNTER — Other Ambulatory Visit

## 2025-11-10 ENCOUNTER — Encounter (INDEPENDENT_AMBULATORY_CARE_PROVIDER_SITE_OTHER): Admitting: Ophthalmology
